# Patient Record
Sex: Female | Born: 1935 | Race: White | Hispanic: No | Smoking: Never smoker
Health system: Southern US, Community
[De-identification: ages and names within clinical notes are randomized; demographics above are authoritative.]

## PROBLEM LIST (undated history)

## (undated) DIAGNOSIS — N179 Acute kidney failure, unspecified: Secondary | ICD-10-CM

## (undated) DIAGNOSIS — W19XXXA Unspecified fall, initial encounter: Secondary | ICD-10-CM

## (undated) DIAGNOSIS — E039 Hypothyroidism, unspecified: Secondary | ICD-10-CM

## (undated) DIAGNOSIS — E119 Type 2 diabetes mellitus without complications: Secondary | ICD-10-CM

## (undated) DIAGNOSIS — I499 Cardiac arrhythmia, unspecified: Secondary | ICD-10-CM

## (undated) DIAGNOSIS — I10 Essential (primary) hypertension: Secondary | ICD-10-CM

## (undated) DIAGNOSIS — C3490 Malignant neoplasm of unspecified part of unspecified bronchus or lung: Secondary | ICD-10-CM

## (undated) DIAGNOSIS — I639 Cerebral infarction, unspecified: Secondary | ICD-10-CM

## (undated) DIAGNOSIS — R55 Syncope and collapse: Secondary | ICD-10-CM

## (undated) DIAGNOSIS — I2510 Atherosclerotic heart disease of native coronary artery without angina pectoris: Secondary | ICD-10-CM

## (undated) DIAGNOSIS — D649 Anemia, unspecified: Secondary | ICD-10-CM

## (undated) DIAGNOSIS — R0602 Shortness of breath: Secondary | ICD-10-CM

## (undated) DIAGNOSIS — K5792 Diverticulitis of intestine, part unspecified, without perforation or abscess without bleeding: Secondary | ICD-10-CM

## (undated) HISTORY — PX: BREAST EXCISIONAL BIOPSY: SUR124

## (undated) HISTORY — PX: ABDOMINAL HYSTERECTOMY: SHX81

## (undated) HISTORY — PX: CARDIAC CATHETERIZATION: SHX172

## (undated) HISTORY — PX: CHOLECYSTECTOMY: SHX55

## (undated) HISTORY — PX: HEMORRHOID SURGERY: SHX153

## (undated) HISTORY — PX: BREAST SURGERY: SHX581

## (undated) HISTORY — PX: OTHER SURGICAL HISTORY: SHX169

---

## 2002-08-17 ENCOUNTER — Encounter: Admission: RE | Admit: 2002-08-17 | Discharge: 2002-08-17 | Payer: Self-pay | Admitting: Endocrinology

## 2002-08-17 ENCOUNTER — Encounter: Payer: Self-pay | Admitting: Endocrinology

## 2002-09-22 ENCOUNTER — Ambulatory Visit (HOSPITAL_COMMUNITY): Admission: RE | Admit: 2002-09-22 | Discharge: 2002-09-22 | Payer: Self-pay | Admitting: *Deleted

## 2002-09-22 ENCOUNTER — Encounter (INDEPENDENT_AMBULATORY_CARE_PROVIDER_SITE_OTHER): Payer: Self-pay | Admitting: Specialist

## 2003-02-23 ENCOUNTER — Encounter: Admission: RE | Admit: 2003-02-23 | Discharge: 2003-02-23 | Payer: Self-pay | Admitting: Endocrinology

## 2003-02-23 ENCOUNTER — Encounter: Payer: Self-pay | Admitting: Endocrinology

## 2003-03-06 ENCOUNTER — Encounter: Admission: RE | Admit: 2003-03-06 | Discharge: 2003-03-06 | Payer: Self-pay | Admitting: Endocrinology

## 2003-03-06 ENCOUNTER — Encounter: Payer: Self-pay | Admitting: Endocrinology

## 2003-04-14 ENCOUNTER — Encounter: Payer: Self-pay | Admitting: Neurosurgery

## 2003-04-14 ENCOUNTER — Inpatient Hospital Stay (HOSPITAL_COMMUNITY): Admission: EM | Admit: 2003-04-14 | Discharge: 2003-04-19 | Payer: Self-pay | Admitting: *Deleted

## 2003-04-14 ENCOUNTER — Encounter: Admission: RE | Admit: 2003-04-14 | Discharge: 2003-04-14 | Payer: Self-pay | Admitting: Neurosurgery

## 2003-04-14 ENCOUNTER — Encounter: Payer: Self-pay | Admitting: Radiology

## 2003-09-06 ENCOUNTER — Encounter: Payer: Self-pay | Admitting: Endocrinology

## 2003-09-06 ENCOUNTER — Ambulatory Visit (HOSPITAL_COMMUNITY): Admission: RE | Admit: 2003-09-06 | Discharge: 2003-09-06 | Payer: Self-pay | Admitting: Endocrinology

## 2003-09-22 ENCOUNTER — Encounter: Admission: RE | Admit: 2003-09-22 | Discharge: 2003-09-22 | Payer: Self-pay | Admitting: Endocrinology

## 2003-09-22 ENCOUNTER — Encounter: Payer: Self-pay | Admitting: Endocrinology

## 2003-09-25 ENCOUNTER — Encounter: Payer: Self-pay | Admitting: Endocrinology

## 2003-09-25 ENCOUNTER — Ambulatory Visit (HOSPITAL_COMMUNITY): Admission: RE | Admit: 2003-09-25 | Discharge: 2003-09-25 | Payer: Self-pay | Admitting: Endocrinology

## 2003-09-25 ENCOUNTER — Encounter (INDEPENDENT_AMBULATORY_CARE_PROVIDER_SITE_OTHER): Payer: Self-pay | Admitting: Specialist

## 2003-11-13 ENCOUNTER — Ambulatory Visit (HOSPITAL_COMMUNITY): Admission: RE | Admit: 2003-11-13 | Discharge: 2003-11-13 | Payer: Self-pay | Admitting: Oncology

## 2003-11-30 ENCOUNTER — Ambulatory Visit (HOSPITAL_COMMUNITY): Admission: RE | Admit: 2003-11-30 | Discharge: 2003-11-30 | Payer: Self-pay | Admitting: Oncology

## 2003-12-05 ENCOUNTER — Ambulatory Visit (HOSPITAL_COMMUNITY): Admission: RE | Admit: 2003-12-05 | Discharge: 2003-12-05 | Payer: Self-pay | Admitting: Oncology

## 2004-09-23 ENCOUNTER — Encounter: Admission: RE | Admit: 2004-09-23 | Discharge: 2004-09-23 | Payer: Self-pay | Admitting: Endocrinology

## 2004-10-25 ENCOUNTER — Ambulatory Visit (HOSPITAL_COMMUNITY): Admission: RE | Admit: 2004-10-25 | Discharge: 2004-10-25 | Payer: Self-pay | Admitting: Endocrinology

## 2005-05-27 ENCOUNTER — Ambulatory Visit (HOSPITAL_COMMUNITY): Admission: RE | Admit: 2005-05-27 | Discharge: 2005-05-27 | Payer: Self-pay | Admitting: Family Medicine

## 2005-09-19 ENCOUNTER — Emergency Department (HOSPITAL_COMMUNITY): Admission: EM | Admit: 2005-09-19 | Discharge: 2005-09-19 | Payer: Self-pay | Admitting: Emergency Medicine

## 2005-09-24 ENCOUNTER — Encounter: Admission: RE | Admit: 2005-09-24 | Discharge: 2005-09-24 | Payer: Self-pay | Admitting: Endocrinology

## 2006-01-08 ENCOUNTER — Ambulatory Visit (HOSPITAL_COMMUNITY): Admission: RE | Admit: 2006-01-08 | Discharge: 2006-01-08 | Payer: Self-pay | Admitting: Family Medicine

## 2006-07-07 ENCOUNTER — Emergency Department (HOSPITAL_COMMUNITY): Admission: EM | Admit: 2006-07-07 | Discharge: 2006-07-07 | Payer: Self-pay | Admitting: Emergency Medicine

## 2006-09-25 ENCOUNTER — Encounter: Admission: RE | Admit: 2006-09-25 | Discharge: 2006-09-25 | Payer: Self-pay | Admitting: Endocrinology

## 2007-09-27 ENCOUNTER — Encounter: Admission: RE | Admit: 2007-09-27 | Discharge: 2007-09-27 | Payer: Self-pay | Admitting: Endocrinology

## 2008-09-27 ENCOUNTER — Encounter: Admission: RE | Admit: 2008-09-27 | Discharge: 2008-09-27 | Payer: Self-pay | Admitting: Endocrinology

## 2009-09-28 ENCOUNTER — Encounter: Admission: RE | Admit: 2009-09-28 | Discharge: 2009-09-28 | Payer: Self-pay | Admitting: Endocrinology

## 2009-10-17 ENCOUNTER — Encounter: Admission: RE | Admit: 2009-10-17 | Discharge: 2009-10-17 | Payer: Self-pay | Admitting: Endocrinology

## 2009-12-01 ENCOUNTER — Inpatient Hospital Stay (HOSPITAL_COMMUNITY): Admission: EM | Admit: 2009-12-01 | Discharge: 2009-12-04 | Payer: Self-pay | Admitting: Emergency Medicine

## 2009-12-03 ENCOUNTER — Encounter (INDEPENDENT_AMBULATORY_CARE_PROVIDER_SITE_OTHER): Payer: Self-pay | Admitting: Family Medicine

## 2010-03-22 ENCOUNTER — Encounter: Admission: RE | Admit: 2010-03-22 | Discharge: 2010-03-22 | Payer: Self-pay | Admitting: Endocrinology

## 2010-05-15 ENCOUNTER — Encounter: Admission: RE | Admit: 2010-05-15 | Discharge: 2010-05-15 | Payer: Self-pay | Admitting: Endocrinology

## 2010-09-30 ENCOUNTER — Encounter: Admission: RE | Admit: 2010-09-30 | Discharge: 2010-09-30 | Payer: Self-pay | Admitting: Endocrinology

## 2011-01-18 ENCOUNTER — Encounter (HOSPITAL_COMMUNITY): Payer: Self-pay | Admitting: Oncology

## 2011-02-14 ENCOUNTER — Emergency Department (HOSPITAL_COMMUNITY): Payer: Medicare Other

## 2011-02-14 ENCOUNTER — Emergency Department (HOSPITAL_COMMUNITY)
Admission: EM | Admit: 2011-02-14 | Discharge: 2011-02-14 | Disposition: A | Discharge status: Home / Self Care | Payer: Medicare Other | Attending: Emergency Medicine | Admitting: Emergency Medicine

## 2011-02-14 DIAGNOSIS — M79609 Pain in unspecified limb: Secondary | ICD-10-CM | POA: Insufficient documentation

## 2011-02-14 DIAGNOSIS — M810 Age-related osteoporosis without current pathological fracture: Secondary | ICD-10-CM | POA: Insufficient documentation

## 2011-02-14 DIAGNOSIS — R51 Headache: Secondary | ICD-10-CM | POA: Insufficient documentation

## 2011-02-14 DIAGNOSIS — M545 Low back pain, unspecified: Secondary | ICD-10-CM | POA: Insufficient documentation

## 2011-02-14 DIAGNOSIS — R609 Edema, unspecified: Secondary | ICD-10-CM | POA: Insufficient documentation

## 2011-02-14 DIAGNOSIS — Z9889 Other specified postprocedural states: Secondary | ICD-10-CM | POA: Insufficient documentation

## 2011-02-14 DIAGNOSIS — H538 Other visual disturbances: Secondary | ICD-10-CM | POA: Insufficient documentation

## 2011-02-14 DIAGNOSIS — Y92009 Unspecified place in unspecified non-institutional (private) residence as the place of occurrence of the external cause: Secondary | ICD-10-CM | POA: Insufficient documentation

## 2011-02-14 DIAGNOSIS — W010XXA Fall on same level from slipping, tripping and stumbling without subsequent striking against object, initial encounter: Secondary | ICD-10-CM | POA: Insufficient documentation

## 2011-02-14 DIAGNOSIS — R109 Unspecified abdominal pain: Secondary | ICD-10-CM | POA: Insufficient documentation

## 2011-02-14 DIAGNOSIS — E119 Type 2 diabetes mellitus without complications: Secondary | ICD-10-CM | POA: Insufficient documentation

## 2011-02-14 DIAGNOSIS — S62329A Displaced fracture of shaft of unspecified metacarpal bone, initial encounter for closed fracture: Secondary | ICD-10-CM | POA: Insufficient documentation

## 2011-02-14 DIAGNOSIS — M546 Pain in thoracic spine: Secondary | ICD-10-CM | POA: Insufficient documentation

## 2011-02-14 DIAGNOSIS — E039 Hypothyroidism, unspecified: Secondary | ICD-10-CM | POA: Insufficient documentation

## 2011-02-14 DIAGNOSIS — Z85118 Personal history of other malignant neoplasm of bronchus and lung: Secondary | ICD-10-CM | POA: Insufficient documentation

## 2011-02-14 LAB — URINALYSIS, ROUTINE W REFLEX MICROSCOPIC
Hgb urine dipstick: NEGATIVE
Nitrite: NEGATIVE
Protein, ur: 30 mg/dL — AB
Specific Gravity, Urine: 1.023 (ref 1.005–1.030)
Urobilinogen, UA: 0.2 mg/dL (ref 0.0–1.0)

## 2011-02-14 LAB — CBC
HCT: 37.6 % (ref 36.0–46.0)
Platelets: 168 10*3/uL (ref 150–400)
RDW: 12.2 % (ref 11.5–15.5)
WBC: 10.6 10*3/uL — ABNORMAL HIGH (ref 4.0–10.5)

## 2011-02-14 LAB — URINE MICROSCOPIC-ADD ON

## 2011-02-14 LAB — BASIC METABOLIC PANEL
BUN: 8 mg/dL (ref 6–23)
CO2: 26 mEq/L (ref 19–32)
Chloride: 99 mEq/L (ref 96–112)
Creatinine, Ser: 0.89 mg/dL (ref 0.4–1.2)
Glucose, Bld: 236 mg/dL — ABNORMAL HIGH (ref 70–99)
Potassium: 3.5 mEq/L (ref 3.5–5.1)

## 2011-02-14 LAB — DIFFERENTIAL
Basophils Absolute: 0 10*3/uL (ref 0.0–0.1)
Basophils Relative: 0 % (ref 0–1)
Eosinophils Relative: 1 % (ref 0–5)
Lymphocytes Relative: 30 % (ref 12–46)

## 2011-04-01 LAB — CBC
HCT: 31.5 % — ABNORMAL LOW (ref 36.0–46.0)
Hemoglobin: 10.6 g/dL — ABNORMAL LOW (ref 12.0–15.0)
Hemoglobin: 13.7 g/dL (ref 12.0–15.0)
MCHC: 32.9 g/dL (ref 30.0–36.0)
MCHC: 33.7 g/dL (ref 30.0–36.0)
MCHC: 33.7 g/dL (ref 30.0–36.0)
MCV: 89.6 fL (ref 78.0–100.0)
MCV: 91.7 fL (ref 78.0–100.0)
Platelets: 84 10*3/uL — ABNORMAL LOW (ref 150–400)
Platelets: 98 10*3/uL — ABNORMAL LOW (ref 150–400)
Platelets: 99 10*3/uL — ABNORMAL LOW (ref 150–400)
RBC: 3.33 MIL/uL — ABNORMAL LOW (ref 3.87–5.11)
RBC: 3.52 MIL/uL — ABNORMAL LOW (ref 3.87–5.11)
RBC: 4.55 MIL/uL (ref 3.87–5.11)
RDW: 13.1 % (ref 11.5–15.5)
WBC: 11.3 10*3/uL — ABNORMAL HIGH (ref 4.0–10.5)
WBC: 4.3 10*3/uL (ref 4.0–10.5)
WBC: 4.9 10*3/uL (ref 4.0–10.5)

## 2011-04-01 LAB — DIFFERENTIAL
Basophils Relative: 1 % (ref 0–1)
Eosinophils Absolute: 0 10*3/uL (ref 0.0–0.7)
Lymphs Abs: 0.6 10*3/uL — ABNORMAL LOW (ref 0.7–4.0)
Monocytes Absolute: 0.7 10*3/uL (ref 0.1–1.0)
Monocytes Relative: 6 % (ref 3–12)
Neutrophils Relative %: 88 % — ABNORMAL HIGH (ref 43–77)

## 2011-04-01 LAB — BASIC METABOLIC PANEL
BUN: 8 mg/dL (ref 6–23)
BUN: 8 mg/dL (ref 6–23)
CO2: 25 mEq/L (ref 19–32)
Calcium: 7.3 mg/dL — ABNORMAL LOW (ref 8.4–10.5)
Calcium: 7.9 mg/dL — ABNORMAL LOW (ref 8.4–10.5)
Creatinine, Ser: 0.82 mg/dL (ref 0.4–1.2)
GFR calc Af Amer: >60 mL/min (ref >60)
GFR calc non Af Amer: >60 mL/min (ref >60)
Glucose, Bld: 153 mg/dL — ABNORMAL HIGH (ref 70–99)

## 2011-04-01 LAB — URINALYSIS, ROUTINE W REFLEX MICROSCOPIC
Glucose, UA: >1000 mg/dL — AB
pH: 5.5 (ref 5.0–8.0)

## 2011-04-01 LAB — PROTIME-INR: INR: 1.04 (ref 0.00–1.49)

## 2011-04-01 LAB — CARDIAC PANEL(CRET KIN+CKTOT+MB+TROPI)
CK, MB: 1 ng/mL (ref 0.3–4.0)
Relative Index: INVALID (ref 0.0–2.5)
Total CK: 69 U/L (ref 7–177)

## 2011-04-01 LAB — GLUCOSE, CAPILLARY
Glucose-Capillary: 126 mg/dL — ABNORMAL HIGH (ref 70–99)
Glucose-Capillary: 137 mg/dL — ABNORMAL HIGH (ref 70–99)
Glucose-Capillary: 161 mg/dL — ABNORMAL HIGH (ref 70–99)
Glucose-Capillary: 210 mg/dL — ABNORMAL HIGH (ref 70–99)
Glucose-Capillary: 245 mg/dL — ABNORMAL HIGH (ref 70–99)
Glucose-Capillary: 265 mg/dL — ABNORMAL HIGH (ref 70–99)
Glucose-Capillary: 348 mg/dL — ABNORMAL HIGH (ref 70–99)
Glucose-Capillary: 372 mg/dL — ABNORMAL HIGH (ref 70–99)

## 2011-04-01 LAB — URINE MICROSCOPIC-ADD ON

## 2011-04-01 LAB — HEMOGLOBIN A1C
Hgb A1c MFr Bld: 10.4 % — ABNORMAL HIGH (ref 4.6–6.1)
Mean Plasma Glucose: 252 mg/dL

## 2011-04-01 LAB — POCT I-STAT, CHEM 8
BUN: 12 mg/dL (ref 6–23)
Chloride: 98 mEq/L (ref 96–112)
Creatinine, Ser: 0.6 mg/dL (ref 0.4–1.2)
Sodium: 138 mEq/L (ref 135–145)
TCO2: 24 mmol/L (ref 0–100)

## 2011-04-01 LAB — TSH: TSH: 0.716 u[IU]/mL (ref 0.350–4.500)

## 2011-04-01 LAB — TROPONIN I: Troponin I: 0.01 ng/mL (ref 0.00–0.06)

## 2011-05-16 NOTE — Op Note (Signed)
   NAME:  [name], [name]                        ACCOUNT NO.:  [id number]   MEDICAL RECORD NO.:  [id number]                   PATIENT TYPE:  AMB   LOCATION:  ENDO                                 FACILITY:  [facility]   PHYSICIAN:  Georgiana Spinner, M.D.                 DATE OF BIRTH:  [birth year]   DATE OF PROCEDURE:  09/22/2002  DATE OF DISCHARGE:                                 OPERATIVE REPORT   PROCEDURE PERFORMED:  Upper endoscopy.   ENDOSCOPIST:  Georgiana Spinner, M.D.   INDICATIONS FOR PROCEDURE:  Gastroesophageal reflux disease.   ANESTHESIA:  Demerol  70 mg, Versed 7 mg.   DESCRIPTION OF PROCEDURE:  With the patient mildly sedated in the left  lateral decubitus position, the Olympus video endoscope was inserted in the  mouth and passed under direct vision through the esophagus which appeared  normal until we reached the distal esophagus and there were changes of  esophagitis with one area of linear erythema extending cephalad to the  esophagus.  At this area there was a polyp which may have been an  inflammatory polyp that was seen and photographed in the same view.  It ws  then biopsied.  We entered into the stomach.  The fundus, body, antrum,  duodenal bulb and second portion of the duodenum all appeared normal.  From  this point, the endoscope was slowly withdrawn taking circumferential views  of the entire duodenal mucosa until the endoscope was pulled back into the  stomach and placed on retroflexion to view the stomach from below.  The  endoscope was then straightened and withdrawn taking circumferential views  of the remaining gastric and esophageal mucosa.  The patient's vital signs  and pulse oximeter remained stable.  The patient tolerated the procedure  well without apparent complications.   FINDINGS:  Esophagitis with esophageal polyp.  Await biopsy report.  The  patient will call me for results and follow up with me as an outpatient.   PLAN:  Await biopsy report.  The  patient will call me for results and follow  up with me as an outpatient.  Proceed to colonoscopy as planned.                                                Georgiana Spinner, M.D.    GMO/MEDQ  D:  09/22/2002  T:  09/22/2002  Job:  [id number]

## 2011-05-16 NOTE — H&P (Signed)
NAME:  [name], [name]                        ACCOUNT NO.:  [id number]   MEDICAL RECORD NO.:  [id number]                   PATIENT TYPE:  INP   LOCATION:  5506                                 FACILITY:  [facility]   PHYSICIAN:  Soyla Murphy. Renne Crigler, M.D.               DATE OF BIRTH:  28-Feb-1934   DATE OF ADMISSION:  04/14/2003  DATE OF DISCHARGE:                                HISTORY & PHYSICAL   LABORATORY:  Sodium 136, potassium 4.5, glucose 604, BUN 11, creatinine 1.0.  Remainder of C-MET, C-MAT is within normal limits.  CBC is normal  except  for slight increase in percent of neutrophils 86%.  White count is 9.0.   Urinalysis glucose greater than 1,000.  Otherwise negative.   PROCEDURE:  Needle placed at epidural space earlier this day, injection 5 cc  containing 120 mg Depo-Provera-Medrol with 3 cc of 1% lidocaine.  Left L4-L5  interlaminar epidurogram, normal  in appearance.   HISTORY:  [name] is a 77 year old married white resident of [facility] [place] who comes  in with a chief complaint of vomiting.  She felt well this morning.  She had  the epidural steroid injection as noted above earlier today.  Subsequently  she was nauseated.  She had dry heaves.  She had a CBG of 400.  She did not  really vomit.  It was more dry heaves and came into the emergency room.  Here her glucose was greater than 600.  She had known diabetes mellitus and  was treating herself with an herbal supplement daily.  One week ago Dr.  Juleen China switched her from that to Avandia.  However, she had not yet picked up  the Avandia at the drug store.  She was going to do so tomorrow and switch  over to the Avandia from the herbal supplement.  She denies any  hematochezia, melena, hematemesis or abdominal pain.  No clear exacerbating  or any factors other then as noted above.  She denies any fevers.   PAST MEDICAL HISTORY:  Notable for multiple surgeries which are outlined in  detail below.   ALLERGIES:  She is intolerant of  CODEINE.  No known allergies.   FAMILY HISTORY:  Mother died of old age but she had breast cancer.  Brother  had breast cancer.  Father died of cerebral hemorrhage.  Sister died of  diabetes.  She has three sons alive and well.   SOCIAL HISTORY:  She worked at [employer] in [employer] before moving to  [place], where she worked at [employer].  Now she is retired.  She does not  ___________.  She does not use ethanol or tobacco.   REVIEW OF SYSTEMS:  Some numbness in toes.  She denies any  headaches,  weakness, seizures, syncope, change in hearing or change in vision.  There  is no sinus pain, ear pain, sore throat, runny nose.  Occasionally she feels  a little bit of a fullness and swelling in the lower throat area.  There is  no palpitations, chest pain or shortness of breath or cough.  She denies any  skin changes, lumps or bumps or mood change, no temperature change.  She  does have dyspareunia and sometimes bleeding after intercourse but she has  no other vaginal bleeding.  She denies any dysuria, hematuria, or frequency  or urgency.  There is no muscle or joint aches other then the recent left  leg pains for which she is on the narcotic medication.   CURRENT MEDICATIONS:  1. Altace 10 mg p.o. daily.  2. Os-Cal with D 500 mg b.i.d.  3. Pravigard 81/42 p.o. q.p.m.  4. Levoxyl 0.125 mg p.o. daily.  5. Avandia 4 mg p.o. q.a.m.  6. Hydrocodone/APAP 5/500 one four times a day p.r.n.   PHYSICAL EXAMINATION:  GENERAL:  She is well-appearing, in no acute  distress.  VITAL SIGNS:  BP 194/79, pulse 112, respirations 20, temperature 97.9.  SKIN:  Shows scattered nevi.  There is no cervical, supraclavicular,  axillary or inguinal adenopathy.  HEENT:  Head is normocephalic, atraumatic.  Tympanic membranes, posterior  pharynx and tongue appeared normal.  Lids and conjunctivae are normal.  PERRL; EOMI.  NECK:  Supple.  Mild goiter.  No neck masses.  She has prominent hump in  the lower neck  area.  LUNGS:  Clear to auscultation.  HEART:  Regular, rate and rhythm.  Normal  S1, S2.  No murmurs, rubs or  gallops.  Equal 2+ carotid, radial, and dorsalis pedis pulses with no  carotid bruits.  BREASTS:  Without masses or tenderness.  ABDOMEN:  Nontender.  Bowel sounds present.  No organomegaly.  No masses.  GENITOURINARY:  Atrophic external genitalia.  Bimanual exam showing status  post hysterectomy.  No masses, no tenderness, no blood.  RECTAL:  Stool is brown and heme negative.  No rectal masses or tenderness.  Normal  rectal tone.  EXTREMITIES:  She has 2+ equal deep tendon reflexes in knees.  NEUROLOGIC:  Normal  speech.  Gait is not tested.  She has equal strength  and EHL hip flexors, knee flexors and knee extensors bilaterally.  Cranial  nerves II-XII are intact.   IMPRESSION:  1. Diabetes mellitus out of control related to the epidural injection.     Placed on Glucometer.  We will try and get her off of that tonight,     tomorrow morning and on to the Avandia.  Please get Avandia filled.     Possibly home on Sunday if stable.  2. Hypertension.  Monitor and current medications.  3. Dyspareunia, vaginal bleeding.  Will need evaluation with speculum after     hospital stay.  4. Probable diabetic neuropathy next to her goiter.  5. Occasional dysphagia, question related to goiter.  6. Multiple surgeries.     a. Benign breast surgery.     b. Tumors removed from right side of head.     c. Right ear surgery.     d. Hysterectomy with bilateral salpingo-oophorectomy.     e. Cholecystectomy.     f. Wrist surgery.  7.     Intolerant - CODEINE.  8. Family history of breast cancer and diabetes mellitus.  9. Nausea and vomiting probably secondary to number one.  There is no     evidence of diabetic ketoacidosis.  Soyla Murphy. Renne Crigler, M.D.    WDP/MEDQ  D:  04/14/2003  T:  04/15/2003  Job:  [id number]   cc:   Brooke Bonito, M.D.   [address]  [place]  [place] [address]  Fax: [phone]

## 2011-05-16 NOTE — Op Note (Signed)
   NAME:  [name], [name]                        ACCOUNT NO.:  [id number]   MEDICAL RECORD NO.:  [id number]                   PATIENT TYPE:  AMB   LOCATION:  ENDO                                 FACILITY:  [facility]   PHYSICIAN:  Georgiana Spinner, M.D.                 DATE OF BIRTH:  01/13/1934   DATE OF PROCEDURE:  09/22/2002  DATE OF DISCHARGE:                                 OPERATIVE REPORT   PROCEDURE PERFORMED:  Colonoscopy.   ENDOSCOPIST:  Georgiana Spinner, M.D.   INDICATIONS FOR PROCEDURE:  Rectal bleeding.   ANESTHESIA:  Versed 1 mg.   DESCRIPTION OF PROCEDURE:  With the patient mildly sedated in the left  lateral decubitus the Olympus video colonoscope was inserted in the rectum  and passed under direct vision to the cecum, identified by the ileocecal  valve and appendiceal orifice.  In the cecum there were areas of linear  mucosal defects that had bled somewhat which were consistent with traction  on the cecum presumably from adhesions and from this point the colonoscope  was slowly withdrawn taking circumferential views of the entire colonic  mucosa stopping only in the rectum which appeared normal on direct and  showed hemorrhoids on retroflex view.  The endoscope was straightened and  withdrawn.  The patient's vital signs and pulse oximeter remained stable.  The patient tolerated the procedure well without apparent complications.   FINDINGS:  Internal hemorrhoids.  Otherwise unremarkable colonoscopic  examination.   PLAN:  Will have the patient follow up with me as an outpatient.                                                Georgiana Spinner, M.D.    GMO/MEDQ  D:  09/22/2002  T:  09/22/2002  Job:  [id number]

## 2011-05-16 NOTE — Discharge Summary (Signed)
   NAME:  [name], [name]                        ACCOUNT NO.:  [id number]   MEDICAL RECORD NO.:  [id number]                   PATIENT TYPE:  INP   LOCATION:  5501                                 FACILITY:  [facility]   PHYSICIAN:  Brooke Bonito, M.D.                   DATE OF BIRTH:  17-Jul-1934   DATE OF ADMISSION:  04/14/2003  DATE OF DISCHARGE:  04/19/2003                                 DISCHARGE SUMMARY   DISCHARGE DIAGNOSES:  1. Diabetes mellitus out of control secondary to epidural injection.  2. Hypertension.  3. Dyspareunia and vaginal bleeding.  4. Thyroid goiter.  5. Multiple past surgeries, including breast surgery, right ear surgery,     hysterectomy, cholecystectomy, and wrist surgery.   HOSPITAL COURSE:  The patient is a 77 year old, female patient of Dr. Juleen China,  admitted in his absence by Soyla Murphy. Renne Crigler, M.D., after she presented with a  glucose of 604 after she received an epidural injection for chronic pain  syndrome.  She was admitted and was treated with insulin and her sugar  greatly improved.  It was noteworthy that the patient was not taking her  medications as I advised her on an outpatient basis and I felt it would be  best just to place her on insulin therapy and this was done prior to her  dismissal.  At the time of dismissal, she was improved.  She was seen by the  diabetic educator.  She was discharged to be followed by myself in the  office.   DISCHARGE MEDICATIONS:  1. Lantus insulin 30 units subcu every evening.  2. Altace 10 mg daily.  3. Pravagard 80 mg daily.  4. Levoxyl 0.125 mg daily.  5. Avandia 4 mg daily.   LABORATORY DATA:  The EKG had a normal sinus rhythm.   CBC on April 14, 2003:  The white count was 9000 and the hemoglobin was  13.6.  Electrolytes:  The sodium was 136, potassium 4.5, chloride 101, CO2  22, glucose 604, BUN 11, and creatinine 1.0.  On April 19, 2003, the sodium  was 140, potassium 3.8, glucose 268, creatinine 0.8, and BUN  19.  Cardiac  enzymes were unremarkable.  The urinalysis on April 14, 2003, had 15 mg/dl.   FOLLOWUP:  Follow up with Dr. Juleen China in a week's time.                                               Brooke Bonito, M.D.    WDK/MEDQ  D:  05/25/2003  T:  05/26/2003  Job:  [id number]

## 2011-09-02 ENCOUNTER — Other Ambulatory Visit: Payer: Self-pay | Admitting: Endocrinology

## 2011-09-02 DIAGNOSIS — Z1231 Encounter for screening mammogram for malignant neoplasm of breast: Secondary | ICD-10-CM

## 2011-10-03 ENCOUNTER — Ambulatory Visit
Admission: RE | Admit: 2011-10-03 | Discharge: 2011-10-03 | Disposition: A | Discharge status: Home / Self Care | Payer: Medicare Other | Source: Ambulatory Visit | Attending: Endocrinology | Admitting: Endocrinology

## 2011-10-03 DIAGNOSIS — Z1231 Encounter for screening mammogram for malignant neoplasm of breast: Secondary | ICD-10-CM

## 2012-03-13 ENCOUNTER — Other Ambulatory Visit (HOSPITAL_COMMUNITY): Payer: Self-pay | Admitting: Pharmacy Technician

## 2012-03-13 ENCOUNTER — Emergency Department (HOSPITAL_COMMUNITY): Payer: Medicare Other

## 2012-03-13 ENCOUNTER — Encounter (HOSPITAL_COMMUNITY): Payer: Self-pay | Admitting: Adult Health

## 2012-03-13 ENCOUNTER — Inpatient Hospital Stay (HOSPITAL_COMMUNITY)
Admission: EM | Admit: 2012-03-13 | Discharge: 2012-03-17 | DRG: 308 | Disposition: A | Discharge status: Home / Self Care | Payer: Medicare Other | Attending: Internal Medicine | Admitting: Internal Medicine

## 2012-03-13 DIAGNOSIS — IMO0002 Reserved for concepts with insufficient information to code with codable children: Secondary | ICD-10-CM | POA: Diagnosis present

## 2012-03-13 DIAGNOSIS — E119 Type 2 diabetes mellitus without complications: Secondary | ICD-10-CM

## 2012-03-13 DIAGNOSIS — I509 Heart failure, unspecified: Secondary | ICD-10-CM | POA: Diagnosis present

## 2012-03-13 DIAGNOSIS — E1129 Type 2 diabetes mellitus with other diabetic kidney complication: Secondary | ICD-10-CM | POA: Diagnosis present

## 2012-03-13 DIAGNOSIS — I4891 Unspecified atrial fibrillation: Principal | ICD-10-CM | POA: Diagnosis present

## 2012-03-13 DIAGNOSIS — I639 Cerebral infarction, unspecified: Secondary | ICD-10-CM | POA: Diagnosis present

## 2012-03-13 DIAGNOSIS — D3A090 Benign carcinoid tumor of the bronchus and lung: Secondary | ICD-10-CM | POA: Diagnosis present

## 2012-03-13 DIAGNOSIS — E1165 Type 2 diabetes mellitus with hyperglycemia: Secondary | ICD-10-CM | POA: Diagnosis present

## 2012-03-13 DIAGNOSIS — R0602 Shortness of breath: Secondary | ICD-10-CM | POA: Diagnosis present

## 2012-03-13 DIAGNOSIS — I5033 Acute on chronic diastolic (congestive) heart failure: Secondary | ICD-10-CM | POA: Diagnosis present

## 2012-03-13 DIAGNOSIS — E039 Hypothyroidism, unspecified: Secondary | ICD-10-CM | POA: Diagnosis present

## 2012-03-13 DIAGNOSIS — R0789 Other chest pain: Secondary | ICD-10-CM | POA: Diagnosis present

## 2012-03-13 DIAGNOSIS — Z8673 Personal history of transient ischemic attack (TIA), and cerebral infarction without residual deficits: Secondary | ICD-10-CM

## 2012-03-13 DIAGNOSIS — R110 Nausea: Secondary | ICD-10-CM | POA: Diagnosis present

## 2012-03-13 DIAGNOSIS — I2510 Atherosclerotic heart disease of native coronary artery without angina pectoris: Secondary | ICD-10-CM | POA: Diagnosis present

## 2012-03-13 DIAGNOSIS — Z794 Long term (current) use of insulin: Secondary | ICD-10-CM | POA: Diagnosis present

## 2012-03-13 DIAGNOSIS — I10 Essential (primary) hypertension: Secondary | ICD-10-CM | POA: Diagnosis present

## 2012-03-13 DIAGNOSIS — R0600 Dyspnea, unspecified: Secondary | ICD-10-CM

## 2012-03-13 DIAGNOSIS — C3490 Malignant neoplasm of unspecified part of unspecified bronchus or lung: Secondary | ICD-10-CM | POA: Diagnosis present

## 2012-03-13 HISTORY — DX: Atherosclerotic heart disease of native coronary artery without angina pectoris: I25.10

## 2012-03-13 HISTORY — DX: Malignant neoplasm of unspecified part of unspecified bronchus or lung: C34.90

## 2012-03-13 HISTORY — DX: Hypothyroidism, unspecified: E03.9

## 2012-03-13 HISTORY — DX: Type 2 diabetes mellitus without complications: E11.9

## 2012-03-13 HISTORY — DX: Essential (primary) hypertension: I10

## 2012-03-13 HISTORY — DX: Cerebral infarction, unspecified: I63.9

## 2012-03-13 LAB — BASIC METABOLIC PANEL
BUN: 14 mg/dL (ref 6–23)
CO2: 25 mEq/L (ref 19–32)
Calcium: 9.2 mg/dL (ref 8.4–10.5)
Chloride: 101 mEq/L (ref 96–112)
Creatinine, Ser: 1 mg/dL (ref 0.50–1.10)
GFR calc Af Amer: 62 mL/min — ABNORMAL LOW (ref >90)
GFR calc non Af Amer: 54 mL/min — ABNORMAL LOW (ref >90)
Glucose, Bld: 276 mg/dL — ABNORMAL HIGH (ref 70–99)
Potassium: 4.1 mEq/L (ref 3.5–5.1)
Sodium: 138 mEq/L (ref 135–145)

## 2012-03-13 LAB — CBC
HCT: 34.5 % — ABNORMAL LOW (ref 36.0–46.0)
Hemoglobin: 12.4 g/dL (ref 12.0–15.0)
MCH: 30.7 pg (ref 26.0–34.0)
MCHC: 35.9 g/dL (ref 30.0–36.0)
MCV: 85.4 fL (ref 78.0–100.0)
Platelets: 147 10*3/uL — ABNORMAL LOW (ref 150–400)
RBC: 4.04 MIL/uL (ref 3.87–5.11)
RDW: 12.4 % (ref 11.5–15.5)
WBC: 7 10*3/uL (ref 4.0–10.5)

## 2012-03-13 LAB — PROTIME-INR
INR: 1.07 (ref 0.00–1.49)
Prothrombin Time: 14.1 seconds (ref 11.6–15.2)

## 2012-03-13 LAB — GLUCOSE, CAPILLARY: Glucose-Capillary: 185 mg/dL — ABNORMAL HIGH (ref 70–99)

## 2012-03-13 LAB — TSH: TSH: 1.742 u[IU]/mL (ref 0.350–4.500)

## 2012-03-13 LAB — CARDIAC PANEL(CRET KIN+CKTOT+MB+TROPI): Total CK: 69 U/L (ref 7–177)

## 2012-03-13 LAB — TROPONIN I: Troponin I: <0.3 ng/mL (ref <0.30)

## 2012-03-13 MED ORDER — MECLIZINE HCL 25 MG PO TABS
25.0000 mg | ORAL_TABLET | Freq: Four times a day (QID) | ORAL | Status: DC
  Administered 2012-03-14 – 2012-03-17 (×11): 25 mg via ORAL
  Filled 2012-03-13 (×17): qty 1

## 2012-03-13 MED ORDER — ONDANSETRON HCL 4 MG/2ML IJ SOLN
4.0000 mg | Freq: Four times a day (QID) | INTRAMUSCULAR | Status: DC | PRN
  Filled 2012-03-13: qty 2

## 2012-03-13 MED ORDER — METOPROLOL TARTRATE 25 MG PO TABS
25.0000 mg | ORAL_TABLET | Freq: Three times a day (TID) | ORAL | Status: DC
  Administered 2012-03-13 – 2012-03-16 (×8): 25 mg via ORAL
  Filled 2012-03-13 (×11): qty 1

## 2012-03-13 MED ORDER — ALBUTEROL SULFATE (5 MG/ML) 0.5% IN NEBU: 2.5000 mg | INHALATION_SOLUTION | RESPIRATORY_TRACT | Status: DC | PRN

## 2012-03-13 MED ORDER — NITROGLYCERIN 2 % TD OINT
TOPICAL_OINTMENT | TRANSDERMAL | Status: AC
  Administered 2012-03-13: 1 [in_us] via TOPICAL
  Filled 2012-03-13: qty 30

## 2012-03-13 MED ORDER — NITROGLYCERIN 2 % TD OINT
1.0000 [in_us] | TOPICAL_OINTMENT | Freq: Four times a day (QID) | TRANSDERMAL | Status: DC
  Administered 2012-03-13: 1 [in_us] via TOPICAL

## 2012-03-13 MED ORDER — RIVAROXABAN 10 MG PO TABS
20.0000 mg | ORAL_TABLET | Freq: Every day | ORAL | Status: DC
  Administered 2012-03-13 – 2012-03-14 (×2): 20 mg via ORAL
  Filled 2012-03-13 (×3): qty 2

## 2012-03-13 MED ORDER — ONDANSETRON HCL 4 MG PO TABS
4.0000 mg | ORAL_TABLET | Freq: Four times a day (QID) | ORAL | Status: DC | PRN
  Administered 2012-03-14: 4 mg via ORAL
  Filled 2012-03-13: qty 1

## 2012-03-13 MED ORDER — SODIUM CHLORIDE 0.9 % IV SOLN
INTRAVENOUS | Status: AC
  Administered 2012-03-13: 21:00:00 via INTRAVENOUS

## 2012-03-13 MED ORDER — SODIUM CHLORIDE 0.9 % IJ SOLN
3.0000 mL | Freq: Two times a day (BID) | INTRAMUSCULAR | Status: DC
  Administered 2012-03-13 – 2012-03-17 (×8): 3 mL via INTRAVENOUS

## 2012-03-13 MED ORDER — ISOSORBIDE MONONITRATE ER 60 MG PO TB24
60.0000 mg | ORAL_TABLET | Freq: Every day | ORAL | Status: DC
  Administered 2012-03-14 – 2012-03-17 (×4): 60 mg via ORAL
  Filled 2012-03-13 (×4): qty 1

## 2012-03-13 MED ORDER — ASPIRIN 81 MG PO CHEW
324.0000 mg | CHEWABLE_TABLET | Freq: Once | ORAL | Status: AC
  Administered 2012-03-13: 324 mg via ORAL

## 2012-03-13 MED ORDER — ONDANSETRON HCL 4 MG/2ML IJ SOLN: 4.0000 mg | Freq: Three times a day (TID) | INTRAMUSCULAR | Status: AC | PRN

## 2012-03-13 MED ORDER — GI COCKTAIL ~~LOC~~
30.0000 mL | Freq: Once | ORAL | Status: AC
  Administered 2012-03-13: 30 mL via ORAL

## 2012-03-13 MED ORDER — ASPIRIN 81 MG PO CHEW
CHEWABLE_TABLET | ORAL | Status: AC
  Administered 2012-03-13: 324 mg via ORAL
  Filled 2012-03-13: qty 4

## 2012-03-13 MED ORDER — LEVOTHYROXINE SODIUM 125 MCG PO TABS
125.0000 ug | ORAL_TABLET | Freq: Every day | ORAL | Status: DC
  Administered 2012-03-14 – 2012-03-17 (×4): 125 ug via ORAL
  Filled 2012-03-13 (×4): qty 1

## 2012-03-13 MED ORDER — SODIUM CHLORIDE 0.9 % IV BOLUS (SEPSIS)
500.0000 mL | Freq: Once | INTRAVENOUS | Status: AC
  Administered 2012-03-13: 1000 mL via INTRAVENOUS

## 2012-03-13 MED ORDER — OLMESARTAN 10 MG HALF TABLET
10.0000 mg | ORAL_TABLET | Freq: Every day | ORAL | Status: DC
  Administered 2012-03-14 – 2012-03-16 (×3): 10 mg via ORAL
  Filled 2012-03-13 (×3): qty 1

## 2012-03-13 MED ORDER — DILTIAZEM HCL 100 MG IV SOLR
5.0000 mg/h | Freq: Once | INTRAVENOUS | Status: AC
  Administered 2012-03-13: 5 mg/h via INTRAVENOUS

## 2012-03-13 MED ORDER — CALCIUM CARBONATE-VITAMIN D 500-200 MG-UNIT PO TABS
1.0000 | ORAL_TABLET | Freq: Every day | ORAL | Status: DC
  Administered 2012-03-14 – 2012-03-17 (×4): 1 via ORAL
  Filled 2012-03-13 (×4): qty 1

## 2012-03-13 MED ORDER — IOHEXOL 300 MG/ML  SOLN
100.0000 mL | Freq: Once | INTRAMUSCULAR | Status: AC | PRN
  Administered 2012-03-13: 100 mL via INTRAVENOUS

## 2012-03-13 MED ORDER — ASPIRIN EC 81 MG PO TBEC
81.0000 mg | DELAYED_RELEASE_TABLET | Freq: Every day | ORAL | Status: DC
  Administered 2012-03-14 – 2012-03-17 (×4): 81 mg via ORAL
  Filled 2012-03-13 (×4): qty 1

## 2012-03-13 MED ORDER — DILTIAZEM HCL 25 MG/5ML IV SOLN
15.0000 mg | Freq: Once | INTRAVENOUS | Status: AC
  Administered 2012-03-13: 15 mg via INTRAVENOUS

## 2012-03-13 MED ORDER — GUAIFENESIN-DM 100-10 MG/5ML PO SYRP: 5.0000 mL | ORAL_SOLUTION | ORAL | Status: DC | PRN

## 2012-03-13 MED ORDER — SODIUM CHLORIDE 0.9 % IV SOLN: INTRAVENOUS | Status: DC

## 2012-03-13 MED ORDER — DILTIAZEM LOAD VIA INFUSION
10.0000 mg | Freq: Once | INTRAVENOUS | Status: AC | PRN
  Filled 2012-03-13: qty 10

## 2012-03-13 MED ORDER — GI COCKTAIL ~~LOC~~
ORAL | Status: AC
  Filled 2012-03-13: qty 30

## 2012-03-13 MED ORDER — HYDROCODONE-ACETAMINOPHEN 5-325 MG PO TABS
1.0000 | ORAL_TABLET | ORAL | Status: DC | PRN
  Administered 2012-03-14: 1 via ORAL
  Filled 2012-03-13: qty 1

## 2012-03-13 MED ORDER — INSULIN ASPART 100 UNIT/ML ~~LOC~~ SOLN
0.0000 [IU] | Freq: Three times a day (TID) | SUBCUTANEOUS | Status: DC
  Administered 2012-03-14: 3 [IU] via SUBCUTANEOUS

## 2012-03-13 MED ORDER — DEXTROSE 5 % IV SOLN
5.0000 mg/h | INTRAVENOUS | Status: DC
  Administered 2012-03-13 – 2012-03-14 (×2): 15 mg/h via INTRAVENOUS
  Filled 2012-03-13 (×4): qty 100

## 2012-03-13 NOTE — ED Provider Notes (Signed)
History    77yF with dyspnea. Onset about a week ago. Finally came in today because she felt like she may pass out. Complaining mild discomfort in chest. No fever or chills. No n/v. No hx of afib as far as she is aware. Known CAD. Previously saw Dr. Jacinto Halim and says he cath'd her but has not seen in several years. Hx of hypothyroid. Denies significant ETOH. Denies hx of valvular dz. No unusual leg pain or swelling.  Denies hx of blood clot.  CSN: [id number]  Arrival date & time 03/13/12  1424   First MD Initiated Contact with Patient 03/13/12 1452      Chief Complaint  Patient presents with  . Shortness of Breath    (Consider location/radiation/quality/duration/timing/severity/associated sxs/prior treatment) HPI  Past Medical History  Diagnosis Date  . Lung cancer   . Stroke   . Diabetes mellitus     History reviewed. No pertinent past surgical history.  History reviewed. No pertinent family history.  History  Substance Use Topics  . Smoking status: Never Smoker   . Smokeless tobacco: Not on file  . Alcohol Use: No    OB History    Grav Para Term Preterm Abortions TAB SAB Ect Mult Living                  Review of Systems   Review of symptoms negative unless otherwise noted in HPI.   Allergies  Review of patient's allergies indicates no known allergies.  Home Medications  No current outpatient prescriptions on file.  BP 112/67  Resp 26  SpO2 96%  Physical Exam  Nursing note and vitals reviewed. Constitutional: She is oriented to person, place, and time. She appears well-developed and well-nourished. No distress.  HENT:  Head: Normocephalic and atraumatic.  Eyes: Conjunctivae are normal. Right eye exhibits no discharge. Left eye exhibits no discharge.  Neck: Neck supple. No thyromegaly present.  Cardiovascular: Normal heart sounds.  Exam reveals no gallop and no friction rub.   No murmur heard.      Irregularly irregular tachycardia  Pulmonary/Chest:  Effort normal and breath sounds normal. No respiratory distress.  Abdominal: Soft. She exhibits no distension. There is no tenderness.  Musculoskeletal: She exhibits no edema and no tenderness.  Lymphadenopathy:    She has no cervical adenopathy.  Neurological: She is alert and oriented to person, place, and time. She exhibits normal muscle tone.  Skin: Skin is warm and dry.  Psychiatric: She has a normal mood and affect. Her behavior is normal. Thought content normal.    ED Course  Procedures (including critical care time)  Labs Reviewed  CBC - Abnormal; Notable for the following:    HCT 34.5 (*)    Platelets 147 (*)    All other components within normal limits  BASIC METABOLIC PANEL - Abnormal; Notable for the following:    Glucose, Bld 276 (*)    GFR calc non Af Amer 54 (*)    GFR calc Af Amer 62 (*)    All other components within normal limits  TROPONIN I  TSH   Dg Chest 2 View  03/13/2012  *RADIOLOGY REPORT*  Clinical Data: Shortness of breath  CHEST - 2 VIEW  Comparison: Report 02/25/11, no images available and chest x-ray 12/01/2009  Findings: Cardiomediastinal silhouette is stable.  No acute infiltrate or pulmonary edema.  Mild elevation of the right anterior diaphragm.  A nodule in the right base posteriorly measures 1.9 cm stable in size and appearance  from prior exam.  IMPRESSION: No active disease.  No significant change.  Original Report Authenticated By: Natasha Mead, M.D.   Ct Angio Chest W/cm &/or Wo Cm  03/13/2012  *RADIOLOGY REPORT*  Clinical Data: Dyspnea, new onset of atrial fibrillation, history of lung cancer  CT ANGIOGRAPHY CHEST  Technique:  Multidetector CT imaging of the chest using the standard protocol during bolus administration of intravenous contrast. Multiplanar reconstructed images including MIPs were obtained and reviewed to evaluate the vascular anatomy.  Contrast: OMNIPAQUE IOHEXOL 300 MG/ML IJ SOLN  Comparison: CT report from [facility] hospital 02/09/12  no images available  Findings: The study is of excellent technical quality.  No pulmonary embolus is identified.  Atherosclerotic calcifications of thoracic aorta are noted.  There are coronary artery calcifications.  Heart size is within normal limits.  No pericardial effusion is noted.  A precarinal lymph node measures 1.1 x 1.1 cm not pathologic by size criteria.  There is bilateral central peribronchial thickening.  A right hilar lymph node measures 1.1 x 1 cm borderline by size criteria.  Left hilar lymph node measures 1.1 x 1 cm borderline by size criteria.  Images of the lung parenchyma shows no acute infiltrate or pulmonary edema.  A dominant nodule in the right middle lobe measures 1.2 cm.  This is stable in size from prior exam according to the report.  Additional lung nodules are noted in the right middle lobe the largest measures 9 mm.  Nodules are noted bilateral lower lobe.  The largest nodule in the right lower lobe medially measures 1.8 cm.  This was not measured however by chest x-ray appearance this appears stable.  Small nodules in the left lower lobe are noted the largest in axial image 55 measures 8 mm.  No destructive bony lesions are noted.  There is diffuse osteopenia and mild degenerative changes of the thoracic spine.  The visualized upper abdomen is unremarkable.  IMPRESSION:  1.  No pulmonary embolus is noted. 2.  Borderline bilateral hilar lymph nodes. 3.   Central peribronchial thickening bilaterally.  4.  At least three or four nodules are noted in the right middle lobe the largest measures 1.2 cm.  According to the report this is stable from prior exam.  Scattered nodules are noted in the right lower lobe the largest measures 1.8 cm.  No measurement was provided on Report however by chest x-ray this is stable from prior exams.  Scattered nodules are noted in the left lower lobe the largest measures 8 mm.  Follow-up examination in 6 months is recommended to assure stability if no other  follow up was recommended by [facility].  Original Report Authenticated By: Natasha Mead, M.D.    EKG:  Rhythm: Atrial fibrillation with right ventricular rate Rate: 167 Axis: Normal axis Intervals: normal ST segments: Nonspecific ST changes. There is T-wave flattening inferiorly and laterally.  1. Atrial fibrillation with RVR   2. Dyspnea       MDM  77yF with new onset afib.  Onset likely about a week ago. Multiple risk factors for stroke including age of 89, htn, dm and hx of stroke and will need anticoagulation. Still with HR in 120s on cardizem gtt of 10mg /hr. Will continue to titrate. CT with no evidence of pulmonary embolism. Initial troponin wnl. Pt states hx of lung CA. Has known pulmonary nodules but doesn't appear that she has ever been treated? Followed at [facility] for this. Discussed case with Dr Jacinto Halim, cardiology who  pt saw years ago. Is requesting hospitalist to admit but will then follow patient.        Raeford Razor, MD 03/13/12 1759

## 2012-03-13 NOTE — Plan of Care (Signed)
Problem: Phase I Progression Outcomes Goal: Ventricular heart rate < 120/min Outcome: Completed/Met Date Met:  03/13/12 HR 90-100 Goal: Initial discharge plan identified Outcome: Completed/Met Date Met:  03/13/12 Pt to return Home with husband

## 2012-03-13 NOTE — ED Notes (Addendum)
Lung cancer pt with increasing sob for one week worse with exertion HR 150. SPO2 on RA 95% c/o epigastric pain and chest pain.

## 2012-03-13 NOTE — H&P (Signed)
[name] [phone]  Outpatient Primary MD for the patient is No primary provider on file.  With History of -  Past Medical History  Diagnosis Date  . Lung cancer   . Stroke   . Diabetes mellitus   . CAD (coronary artery disease) 03/13/2012  . Type II or unspecified type diabetes mellitus without mention of complication, not stated as uncontrolled 03/13/2012  . Hypothyroid 03/13/2012  . HTN (hypertension) 03/13/2012      History reviewed. No pertinent past surgical history.  in for   Chief Complaint  Patient presents with  . Shortness of Breath     HPI  [name]  is a 78 y.o. female, with H/O Lung cancer and goes to [facility] for it, in control for 9 yrs, CAD, CVA,DM-2, HTN, comes in with few month history of palpitations vague generalized chest heaviness  and SOB, symptoms worse with exertion better with rest, comes in as symptoms gradually getting worse, in ER found to be in Afib-RVR. No fevers, no chills, no diarrhea-Nausea or vomiting.  Case D/W Dr Jacinto Halim who will see the patient in am.    Review of Systems    In addition to the HPI above,   No Fever-chills, No Headache, No changes with Vision or hearing, No problems swallowing food or Liquids, + Chest pain chest dyscomfort & palpitations, + Shortness of Breath, No Abdominal pain, No Nausea or Vommitting, Bowel movements are regular, No Blood in stool or Urine, No dysuria, No new skin rashes or bruises, No new joints pains-aches,  No new weakness, tingling, numbness in any extremity, No recent weight gain or loss, No polyuria, polydypsia or polyphagia, No significant Mental Stressors.  A full 10 point Review of Systems was done, except as stated above, all other Review of Systems were negative.   Social History History  Substance Use Topics  . Smoking status: Never Smoker   . Smokeless tobacco: Not on file  . Alcohol Use: No      Family History No Lung CA  Prior to Admission  medications   Medication Sig Start Date End Date Taking? Authorizing Provider  aspirin EC 81 MG tablet Take 81 mg by mouth daily.   Yes Historical Provider, MD  calcium-vitamin D (OSCAL WITH D) 500-200 MG-UNIT per tablet Take 1 tablet by mouth daily.   Yes Historical Provider, MD  isosorbide mononitrate (IMDUR) 60 MG 24 hr tablet Take 60 mg by mouth daily.   Yes Historical Provider, MD  levothyroxine (SYNTHROID, LEVOTHROID) 125 MCG tablet Take 125 mcg by mouth daily.   Yes Historical Provider, MD  meclizine (ANTIVERT) 25 MG tablet Take 25 mg by mouth 4 (four) times daily.   Yes Historical Provider, MD  metFORMIN (GLUCOPHAGE) 500 MG tablet Take 500 mg by mouth 2 (two) times daily with a meal.   Yes Historical Provider, MD  metoprolol succinate (TOPROL-XL) 25 MG 24 hr tablet Take 25 mg by mouth daily.   Yes Historical Provider, MD  potassium chloride SA (K-DUR,KLOR-CON) 20 MEQ tablet Take 20 mEq by mouth daily.   Yes Historical Provider, MD  valsartan (DIOVAN) 320 MG tablet Take 320 mg by mouth daily.   Yes Historical Provider, MD    No Known Allergies  Physical Exam  Vitals  Blood pressure 101/58, pulse 130, resp. rate 19, SpO2 93.00%.   1. General elderly white female lying in bed in NAD,     2. Normal affect and insight, Not Suicidal or Homicidal, Awake Alert, Oriented *3.  3. No F.N deficits, ALL C.Nerves Intact, Strength 5/5 all 4 extremities, Sensation intact all 4 extremities, Plantars down going.  4. Ears and Eyes appear Normal, Conjunctivae clear, PERRLA. Moist Oral Mucosa.  5. Supple Neck, No JVD, No cervical lymphadenopathy appriciated, No Carotid Bruits.  6. Symmetrical Chest wall movement, Good air movement bilaterally, CTAB.  7. iRRR, No Gallops, Rubs or Murmurs, No Parasternal Heave.  8. Positive Bowel Sounds, Abdomen Soft, Non tender, No organomegaly appriciated,       No rebound -guarding or rigidity.  9.  No Cyanosis, Normal Skin Turgor, No Skin Rash or  Bruise.  10. Good muscle tone,  joints appear normal , no effusions, Normal ROM.  11. No Palpable Lymph Nodes in Neck or Axillae     Data Review  CBC  Lab 03/13/12 1516  WBC 7.0  HGB 12.4  HCT 34.5*  PLT 147*  MCV 85.4  MCH 30.7  MCHC 35.9  RDW 12.4  LYMPHSABS --  MONOABS --  EOSABS --  BASOSABS --  BANDABS --   ------------------------------------------------------------------------------------------------------------------ Chemistries   Lab 03/13/12 1516  NA 138  K 4.1  CL 101  CO2 25  GLUCOSE 276*  BUN 14  CREATININE 1.00  CALCIUM 9.2  MG --  AST --  ALT --  ALKPHOS --  BILITOT --   ------------------------------------------------------------------------------------------------------------------ CrCl is unknown because there is no height on file for the current visit. ------------------------------------------------------------------------------------------------------------------ No results found for this basename: TSH,T4TOTAL,FREET3,T3FREE,THYROIDAB in the last 72 hours  Coagulation profile No results found for this basename: INR:5,PROTIME:5 in the last 168 hours ------------------------------------------------------------------------------------------------------------------- No results found for this basename: DDIMER:2 in the last 72 hours ------------------------------------------------------------------------------------------------------------------- Cardiac Enzymes  Lab 03/13/12 1516  CKMB --  TROPONINI <0.30  MYOGLOBIN --   ------------------------------------------------------------------------------------------------------------------ No components found with this basename: POCBNP:3 ------------------------------------------------------------------------------------------------------------------  Imaging results:   Dg Chest 2 View  03/13/2012  *RADIOLOGY REPORT*  Clinical Data: Shortness of breath  CHEST - 2 VIEW  Comparison: Report  02/25/11, no images available and chest x-ray 12/01/2009  Findings: Cardiomediastinal silhouette is stable.  No acute infiltrate or pulmonary edema.  Mild elevation of the right anterior diaphragm.  A nodule in the right base posteriorly measures 1.9 cm stable in size and appearance from prior exam.  IMPRESSION: No active disease.  No significant change.  Original Report Authenticated By: Natasha Mead, M.D.   Ct Angio Chest W/cm &/or Wo Cm  03/13/2012  *RADIOLOGY REPORT*  Clinical Data: Dyspnea, new onset of atrial fibrillation, history of lung cancer  CT ANGIOGRAPHY CHEST  Technique:  Multidetector CT imaging of the chest using the standard protocol during bolus administration of intravenous contrast. Multiplanar reconstructed images including MIPs were obtained and reviewed to evaluate the vascular anatomy.  Contrast: OMNIPAQUE IOHEXOL 300 MG/ML IJ SOLN  Comparison: CT report from [facility] hospital 02/09/12 no images available  Findings: The study is of excellent technical quality.  No pulmonary embolus is identified.  Atherosclerotic calcifications of thoracic aorta are noted.  There are coronary artery calcifications.  Heart size is within normal limits.  No pericardial effusion is noted.  A precarinal lymph node measures 1.1 x 1.1 cm not pathologic by size criteria.  There is bilateral central peribronchial thickening.  A right hilar lymph node measures 1.1 x 1 cm borderline by size criteria.  Left hilar lymph node measures 1.1 x 1 cm borderline by size criteria.  Images of the lung parenchyma shows no acute infiltrate or pulmonary edema.  A dominant  nodule in the right middle lobe measures 1.2 cm.  This is stable in size from prior exam according to the report.  Additional lung nodules are noted in the right middle lobe the largest measures 9 mm.  Nodules are noted bilateral lower lobe.  The largest nodule in the right lower lobe medially measures 1.8 cm.  This was not measured however by chest x-ray  appearance this appears stable.  Small nodules in the left lower lobe are noted the largest in axial image 55 measures 8 mm.  No destructive bony lesions are noted.  There is diffuse osteopenia and mild degenerative changes of the thoracic spine.  The visualized upper abdomen is unremarkable.  IMPRESSION:  1.  No pulmonary embolus is noted. 2.  Borderline bilateral hilar lymph nodes. 3.   Central peribronchial thickening bilaterally.  4.  At least three or four nodules are noted in the right middle lobe the largest measures 1.2 cm.  According to the report this is stable from prior exam.  Scattered nodules are noted in the right lower lobe the largest measures 1.8 cm.  No measurement was provided on Report however by chest x-ray this is stable from prior exams.  Scattered nodules are noted in the left lower lobe the largest measures 8 mm.  Follow-up examination in 6 months is recommended to assure stability if no other follow up was recommended by [facility].  Original Report Authenticated By: Natasha Mead, M.D.    My personal review of EKG: Rhythm A.Fib, Rate 167 /min, QTc 467 , non specific ST changes     Assessment & Plan  Principal Problem:  *Atrial fibrillation with RVR Active Problems:  Lung cancer  Stroke  HTN (hypertension)  Hypothyroid  Type II or unspecified type diabetes mellitus without mention of complication, not stated as uncontrolled  CAD (coronary artery disease)    1.Atrial Fib with RVR - in a pt with CAD, HTN, DM-2 with some rate related chest discomfort -  Admit , Cardizem drip, PO Lopressor overlap, reduce ACE dose to have room in BP to go up on B blocker, Xaralto, R/O MI, Echo, TSH, D/W Dr Jacinto Halim, who agrees with the plan and see in am.   2.DM-2 - A1c, hold glucophage, ISS   3.Lung CA - outpt [facility] follow along with CT report from here.   4.H/O CVA - no acute issue continue ASA, outpt risk factor modulation.   5.Hypothyroidsm - home synthroid dose, check  TSH.   DVT Prophylaxis Xaralto - SCDs    AM Labs Ordered, also please review Full Orders  Admission, patients condition and plan of care including tests being ordered have been discussed with the patient who indicates understanding and agree with the plan and Code Status.  Code Status DNI  Condition Marinell Blight K M.D on 03/13/2012 at 7:11 PM  [facility] Office  [phone]

## 2012-03-13 NOTE — ED Notes (Signed)
MD Singh at bedside.

## 2012-03-14 ENCOUNTER — Other Ambulatory Visit: Payer: Self-pay

## 2012-03-14 LAB — BASIC METABOLIC PANEL
Calcium: 8.5 mg/dL (ref 8.4–10.5)
GFR calc Af Amer: 66 mL/min — ABNORMAL LOW (ref >90)
GFR calc non Af Amer: 57 mL/min — ABNORMAL LOW (ref >90)
Glucose, Bld: 233 mg/dL — ABNORMAL HIGH (ref 70–99)
Potassium: 3.6 mEq/L (ref 3.5–5.1)
Sodium: 137 mEq/L (ref 135–145)

## 2012-03-14 LAB — TSH: TSH: 1.158 u[IU]/mL (ref 0.350–4.500)

## 2012-03-14 LAB — GLUCOSE, CAPILLARY
Glucose-Capillary: 388 mg/dL — ABNORMAL HIGH (ref 70–99)
Glucose-Capillary: 200 mg/dL — ABNORMAL HIGH (ref 70–99)

## 2012-03-14 LAB — CARDIAC PANEL(CRET KIN+CKTOT+MB+TROPI)
Relative Index: INVALID (ref 0.0–2.5)
Relative Index: INVALID (ref 0.0–2.5)
Total CK: 66 U/L (ref 7–177)
Troponin I: <0.3 ng/mL (ref <0.30)

## 2012-03-14 LAB — CBC
Hemoglobin: 12.1 g/dL (ref 12.0–15.0)
Platelets: 147 10*3/uL — ABNORMAL LOW (ref 150–400)
RBC: 3.92 MIL/uL (ref 3.87–5.11)
WBC: 5.6 10*3/uL (ref 4.0–10.5)

## 2012-03-14 LAB — HEMOGLOBIN A1C
Hgb A1c MFr Bld: 9.7 % — ABNORMAL HIGH (ref <5.7)
Mean Plasma Glucose: 232 mg/dL — ABNORMAL HIGH (ref <117)

## 2012-03-14 MED ORDER — INSULIN ASPART 100 UNIT/ML ~~LOC~~ SOLN
3.0000 [IU] | Freq: Three times a day (TID) | SUBCUTANEOUS | Status: DC
  Administered 2012-03-14 – 2012-03-17 (×9): 3 [IU] via SUBCUTANEOUS

## 2012-03-14 MED ORDER — PROMETHAZINE HCL 25 MG PO TABS
12.5000 mg | ORAL_TABLET | Freq: Three times a day (TID) | ORAL | Status: DC | PRN
  Administered 2012-03-14: 12.5 mg via ORAL
  Filled 2012-03-14: qty 1

## 2012-03-14 MED ORDER — INSULIN ASPART 100 UNIT/ML ~~LOC~~ SOLN
0.0000 [IU] | Freq: Three times a day (TID) | SUBCUTANEOUS | Status: DC
  Administered 2012-03-14: 15 [IU] via SUBCUTANEOUS
  Administered 2012-03-15 (×2): 3 [IU] via SUBCUTANEOUS
  Administered 2012-03-15: 5 [IU] via SUBCUTANEOUS
  Administered 2012-03-16 (×2): 3 [IU] via SUBCUTANEOUS
  Administered 2012-03-16: 8 [IU] via SUBCUTANEOUS
  Administered 2012-03-17: 3 [IU] via SUBCUTANEOUS
  Administered 2012-03-17: 5 [IU] via SUBCUTANEOUS

## 2012-03-14 MED ORDER — DILTIAZEM HCL 60 MG PO TABS
60.0000 mg | ORAL_TABLET | Freq: Four times a day (QID) | ORAL | Status: DC
  Administered 2012-03-14: 60 mg via ORAL
  Filled 2012-03-14 (×5): qty 1

## 2012-03-14 MED ORDER — DILTIAZEM HCL 30 MG PO TABS
30.0000 mg | ORAL_TABLET | Freq: Four times a day (QID) | ORAL | Status: DC
  Administered 2012-03-14 – 2012-03-15 (×6): 30 mg via ORAL
  Filled 2012-03-14 (×10): qty 1

## 2012-03-14 NOTE — Progress Notes (Signed)
Pt had 2.13sec pause, BP 90/63, MD notified and Cardizem drip d/c'ed per order parameters. Will continue to monitor patient. Daphine Deutscher, Miranda Kingston

## 2012-03-14 NOTE — Progress Notes (Signed)
  Echocardiogram 2D Echocardiogram has been performed.  Jorje Guild [facility] 03/14/2012, 9:25 AM

## 2012-03-14 NOTE — Progress Notes (Signed)
[name] [id number] is a 78 y.o. female,  Outpatient Primary MD for the patient is No primary provider on file.  Chief Complaint  Patient presents with  . Shortness of Breath        Subjective:   [name] today has, No headache, some ongoing vague chest dyscomfort, No abdominal pain -  No new weakness tingling or numbness, No Cough - SOB.    Objective:   Filed Vitals:   03/14/12 0205 03/14/12 0248 03/14/12 0439 03/14/12 0602  BP: 97/70 101/67  114/81  Pulse: 91  83 90  Temp: 98.1 F (36.7 C)  97.9 F (36.6 C)   TempSrc: Oral  Oral   Resp: 18  19   Height:      Weight:   67.994 kg (149 lb 14.4 oz)   SpO2: 94%  95%     Wt Readings from Last 3 Encounters:  03/14/12 67.994 kg (149 lb 14.4 oz)     Intake/Output Summary (Last 24 hours) at 03/14/12 1036 Last data filed at 03/14/12 0751  Gross per 24 hour  Intake 739.58 ml  Output      0 ml  Net 739.58 ml    Exam Awake Alert, Oriented *3, No new F.N deficits, Normal affect [place].AT,PERRAL Supple Neck,No JVD, No cervical lymphadenopathy appriciated.  Symmetrical Chest wall movement, Good air movement bilaterally, CTAB iRRR,No Gallops,Rubs or new Murmurs, No Parasternal Heave +ve B.Sounds, Abd Soft, Non tender, No organomegaly appriciated, No rebound -guarding or rigidity. No Cyanosis, Clubbing or edema, No new Rash or bruise     Data Review  CBC  Lab 03/14/12 0620 03/13/12 1516  WBC 5.6 7.0  HGB 12.1 12.4  HCT 34.3* 34.5*  PLT 147* 147*  MCV 87.5 85.4  MCH 30.9 30.7  MCHC 35.3 35.9  RDW 12.7 12.4  LYMPHSABS -- --  MONOABS -- --  EOSABS -- --  BASOSABS -- --  BANDABS -- --    Chemistries   Lab 03/14/12 0620 03/13/12 1516  NA 137 138  K 3.6 4.1  CL 102 101  CO2 24 25  GLUCOSE 233* 276*  BUN 11 14  CREATININE 0.95 1.00  CALCIUM 8.5 9.2  MG -- --  AST -- --  ALT -- --  ALKPHOS -- --  BILITOT -- --    ------------------------------------------------------------------------------------------------------------------ estimated creatinine clearance is 47.3 ml/min (by C-G formula based on Cr of 0.95). ------------------------------------------------------------------------------------------------------------------  Basename 03/13/12 2200  HGBA1C 9.7*   ------------------------------------------------------------------------------------------------------------------ No results found for this basename: CHOL:2,HDL:2,LDLCALC:2,TRIG:2,CHOLHDL:2,LDLDIRECT:2 in the last 72 hours ------------------------------------------------------------------------------------------------------------------  [facility] 03/13/12 1516  TSH 1.742  T4TOTAL --  T3FREE --  THYROIDAB --   ------------------------------------------------------------------------------------------------------------------ No results found for this basename: VITAMINB12:2,FOLATE:2,FERRITIN:2,TIBC:2,IRON:2,RETICCTPCT:2 in the last 72 hours  Coagulation profile  Lab 03/13/12 2200  INR 1.07  PROTIME --    No results found for this basename: DDIMER:2 in the last 72 hours  Cardiac Enzymes  Lab 03/14/12 0620 03/13/12 2200 03/13/12 1516  CKMB 2.1 2.5 --  TROPONINI <0.30 <0.30 <0.30  MYOGLOBIN -- -- --   ------------------------------------------------------------------------------------------------------------------ No components found with this basename: POCBNP:3  Micro Results No results found for this or any previous visit (from the past 240 hour(s)).  Radiology Reports Dg Chest 2 View  03/13/2012  *RADIOLOGY REPORT*  Clinical Data: Shortness of breath  CHEST - 2 VIEW  Comparison: Report 02/25/11, no images available and chest x-ray 12/01/2009  Findings: Cardiomediastinal silhouette is stable.  No acute infiltrate or pulmonary edema.  Mild elevation  of the right anterior diaphragm.  A nodule in the right base posteriorly measures  1.9 cm stable in size and appearance from prior exam.  IMPRESSION: No active disease.  No significant change.  Original Report Authenticated By: Natasha Mead, M.D.   Ct Angio Chest W/cm &/or Wo Cm  03/13/2012  *RADIOLOGY REPORT*  Clinical Data: Dyspnea, new onset of atrial fibrillation, history of lung cancer  CT ANGIOGRAPHY CHEST  Technique:  Multidetector CT imaging of the chest using the standard protocol during bolus administration of intravenous contrast. Multiplanar reconstructed images including MIPs were obtained and reviewed to evaluate the vascular anatomy.  Contrast: OMNIPAQUE IOHEXOL 300 MG/ML IJ SOLN  Comparison: CT report from [facility] hospital 02/09/12 no images available  Findings: The study is of excellent technical quality.  No pulmonary embolus is identified.  Atherosclerotic calcifications of thoracic aorta are noted.  There are coronary artery calcifications.  Heart size is within normal limits.  No pericardial effusion is noted.  A precarinal lymph node measures 1.1 x 1.1 cm not pathologic by size criteria.  There is bilateral central peribronchial thickening.  A right hilar lymph node measures 1.1 x 1 cm borderline by size criteria.  Left hilar lymph node measures 1.1 x 1 cm borderline by size criteria.  Images of the lung parenchyma shows no acute infiltrate or pulmonary edema.  A dominant nodule in the right middle lobe measures 1.2 cm.  This is stable in size from prior exam according to the report.  Additional lung nodules are noted in the right middle lobe the largest measures 9 mm.  Nodules are noted bilateral lower lobe.  The largest nodule in the right lower lobe medially measures 1.8 cm.  This was not measured however by chest x-ray appearance this appears stable.  Small nodules in the left lower lobe are noted the largest in axial image 55 measures 8 mm.  No destructive bony lesions are noted.  There is diffuse osteopenia and mild degenerative changes of the thoracic spine.  The  visualized upper abdomen is unremarkable.  IMPRESSION:  1.  No pulmonary embolus is noted. 2.  Borderline bilateral hilar lymph nodes. 3.   Central peribronchial thickening bilaterally.  4.  At least three or four nodules are noted in the right middle lobe the largest measures 1.2 cm.  According to the report this is stable from prior exam.  Scattered nodules are noted in the right lower lobe the largest measures 1.8 cm.  No measurement was provided on Report however by chest x-ray this is stable from prior exams.  Scattered nodules are noted in the left lower lobe the largest measures 8 mm.  Follow-up examination in 6 months is recommended to assure stability if no other follow up was recommended by [facility].  Original Report Authenticated By: Natasha Mead, M.D.    Scheduled Meds:   . aspirin  324 mg Oral Once  . aspirin EC  81 mg Oral Daily  . calcium-vitamin D  1 tablet Oral Daily  . diltiazem (CARDIZEM) infusion  5 mg/hr Intravenous Once  . diltiazem  15 mg Intravenous Once  . diltiazem  60 mg Oral Q6H  . gi cocktail  30 mL Oral Once  . insulin aspart  0-15 Units Subcutaneous TID WC  . insulin aspart  3 Units Subcutaneous TID WC  . isosorbide mononitrate  60 mg Oral Daily  . levothyroxine  125 mcg Oral QAC breakfast  . meclizine  25 mg Oral QID  .  metoprolol tartrate  25 mg Oral Q8H  . olmesartan  10 mg Oral Daily  . rivaroxaban  20 mg Oral QHS  . sodium chloride  500 mL Intravenous Once  . sodium chloride  3 mL Intravenous Q12H  . DISCONTD: insulin aspart  0-9 Units Subcutaneous TID WC  . DISCONTD: nitroGLYCERIN  1 inch Topical Q6H   Continuous Infusions:   . sodium chloride    . sodium chloride 50 mL/hr at 03/13/12 2123  . DISCONTD: diltiazem (CARDIZEM) infusion 10 mg/hr (03/14/12 0412)   PRN Meds:.albuterol, diltiazem, guaiFENesin-dextromethorphan, HYDROcodone-acetaminophen, iohexol, ondansetron (ZOFRAN) IV, ondansetron (ZOFRAN) IV, ondansetron,  promethazine  Assessment & Plan    1.Atrial Fib with RVR - in a pt with CAD, HTN, DM-2 with some rate related chest discomfort - ruled out for MI, in RC, on Po Lopressor and Xaralto, stable TSH-Trop, lytes, Echo done, still some vague chest discomfort ongoing for 1 mth or more, Cards to see. Will repeat EKG now Dr Jacinto Halim to see soon.   2.DM-2 - A1c high, hold glucophage, ISS increased, premeal Insulin added.  Lab Results  Component Value Date   HGBA1C 9.7* 03/13/2012     CBG (last 3)   Basename 03/14/12 0752 03/13/12 2046  GLUCAP 245* 185*      3.Lung CA - outpt [facility] follow along with CT report from here.    4.H/O CVA - no acute issue continue ASA, outpt risk factor modulation.    5.Hypothyroidsm - home synthroid dose, stable TSH.    6.Mild nausea with Narco - added as alergy, zofran/phenergan, EKG now.    7. 2 sec pause on Tele - DC Cardizem, will leave on B Blocker, increase dose if needed, monitor.     DVT Prophylaxis  Lesia Hausen  -  SCDs      Leroy Sea M.D on 03/14/2012 at 10:36 AM  [facility] Office  [phone]

## 2012-03-14 NOTE — Consult Note (Signed)
CARDIOLOGY CONSULT NOTE  Patient ID: [name] MRN: [id number] DOB/AGE: Mar 15, 1934 78 y.o.  Admit date: 03/13/2012 Referring Physician P. Thedore Mins, MD Primary Physician: Adela Lank, MD  Reason for Consultation A. Fibrillation  HPI: Patient is 89 YW female with history of hypertension, diabetes and a rare form of lung cancer being followed at [facility]. She has had remote stroke. She was doing well until about 2 weeks ago had noticed worsening shortness of breath. She is also developed orthopnea. With this she presented to the emergency room yesterday and was found to be in atrial fibrillation with rapid response. She was started on beta blockers and calcium channel blocker and heart rate got better. I've been consulted to evaluate and further treat. Patient states that she has had a remote cardiac catheterization which I had performed and she was told to have a 80% blockage the heart arteries but was recommended medical therapy. She's been doing well and has not been using any nitroglycerin. She does admit to having chest pain going on for last several weeks which she states feels like dryness in the middle of her chest, pain in both the axillary areas. Chest pain comes with a without exertional activities. No other associated symptoms with this. There is no change in the symptoms over the last several weeks.  Past Medical History  Diagnosis Date  . Lung cancer   . Stroke   . Diabetes mellitus   . CAD (coronary artery disease) 03/13/2012  . Type II or unspecified type diabetes mellitus without mention of complication, not stated as uncontrolled 03/13/2012  . Hypothyroid 03/13/2012  . HTN (hypertension) 03/13/2012     History reviewed. No pertinent past surgical history.   History reviewed. No pertinent family history.  Social History: History   Social History  . Marital Status: Married    Spouse Name: N/A    Number of Children: N/A  . Years of Education: N/A     Occupational History  . Not on file.   Social History Main Topics  . Smoking status: Never Smoker   . Smokeless tobacco: Not on file  . Alcohol Use: No  . Drug Use:   . Sexually Active:    Other Topics Concern  . Not on file   Social History Narrative  . No narrative on file     Prescriptions prior to admission  Medication Sig Dispense Refill  . aspirin EC 81 MG tablet Take 81 mg by mouth daily.      . calcium-vitamin D (OSCAL WITH D) 500-200 MG-UNIT per tablet Take 1 tablet by mouth daily.      . isosorbide mononitrate (IMDUR) 60 MG 24 hr tablet Take 60 mg by mouth daily.      Marland Kitchen levothyroxine (SYNTHROID, LEVOTHROID) 125 MCG tablet Take 125 mcg by mouth daily.      . meclizine (ANTIVERT) 25 MG tablet Take 25 mg by mouth 4 (four) times daily.      . metFORMIN (GLUCOPHAGE) 500 MG tablet Take 500 mg by mouth 2 (two) times daily with a meal.      . metoprolol succinate (TOPROL-XL) 25 MG 24 hr tablet Take 25 mg by mouth daily.      . potassium chloride SA (K-DUR,KLOR-CON) 20 MEQ tablet Take 20 mEq by mouth daily.      . valsartan (DIOVAN) 320 MG tablet Take 320 mg by mouth daily.        ROS: General: no fevers/chills/night sweats Eyes: no blurry  vision, diplopia, or amaurosis ENT: no sore throat or hearing loss Resp: no cough, wheezing, or hemoptysis. Does have significant shortness of breath over the last 2 weeks. Has orthopnea but no PND. CV: no edema or palpitations GI: no abdominal pain, nausea, vomiting, diarrhea, or constipation GU: no dysuria, frequency, or hematuria Skin: no rash Neuro: no headache, numbness, tingling, or weakness of extremities Musculoskeletal: no joint pain or swelling Heme: no bleeding, DVT, or easy bruising Endo: no polydipsia or polyuria    Physical Exam: Blood pressure 114/81, pulse 90, temperature 97.9 F (36.6 C), temperature source Oral, resp. rate 19, height 5\' 3"  (1.6 m), weight 67.994 kg (149 lb 14.4 oz), SpO2 95.00%.  Pt is alert  and oriented, WD, WN, in no distress. HEENT: normal Neck: JVP normal. Carotid upstrokes normal without bruits. No thyromegaly. Lungs: equal expansion, there is faint bilateral basal crackles.  CV: S1 is variable, S2 is normal. There is no gallop or murmur appreciated. Abd: soft, NT, +BS, no bruit, no hepatosplenomegaly Back: no CVA tenderness Ext: no C/C/E        Femoral pulses 2+= without bruits        DP/PT pulses intact and = Skin: warm and dry without rash Neuro: CNII-XII intact             Strength intact = bilaterally  Labs:   Lab Results  Component Value Date   WBC 5.6 03/14/2012   HGB 12.1 03/14/2012   HCT 34.3* 03/14/2012   MCV 87.5 03/14/2012   PLT 147* 03/14/2012    Lab 03/14/12 0620  NA 137  K 3.6  CL 102  CO2 24  BUN 11  CREATININE 0.95  CALCIUM 8.5  PROT --  BILITOT --  ALKPHOS --  ALT --  AST --  GLUCOSE 233*   Lab Results  Component Value Date   CKTOTAL 64 03/14/2012   CKMB 2.1 03/14/2012   TROPONINI <0.30 03/14/2012       Radiology:  IMPRESSION:  1.  No pulmonary embolus is noted. 2.  Borderline bilateral hilar lymph nodes. 3.   Central peribronchial thickening bilaterally.  4.  At least three or four nodules are noted in the right middle lobe the largest measures 1.2 cm.  According to the report this is stable from prior exam.  Scattered nodules are noted in the right lower lobe the largest measures 1.8 cm.  No measurement was provided on Report however by chest x-ray this is stable from prior exams.  Scattered nodules are noted in the left lower lobe the largest measures 8 mm.  Follow-up examination in 6 months is recommended to assure stability if no other follow up was recommended by [facility].  Original Report Authenticated By: Natasha Mead, M.D.   EKG: Atrial fibrillation with controlled ventricular response with a V. rate of 88 beats per minute Telemetry reveals atrial fibrillation with controlled ventricular response. No significant process  (2.1 seconds). Echocardiogram done on 12/03/2009 normal left systolic function. Ejection fraction 55-60%. Probable mild grade stenoses, probable bicuspid aortic valve, moderate aortic regurgitation, mild pulmonary Hypertension.  ASSESSMENT AND PLAN:   1. Atrial fibrillation with rapid response, probable onset 2 weeks ago with development of marked dyspnea on exertion and orthopnea. 2. Hypertension controlled 3. Hyperlipidemia 4. Diabetes mellitus type 2 uncontrolled, HbA1c 9.7 5. History of rare wall of lung cancer being followed at [facility]. CT scan done yesterday is stable compared to prior CT scan done in February of 2013. No pulmonary  embolism. 6. Shortness of breath and dyspnea on exertion. I suspect this is due to an underlying lung pathology and also due to atrial fibrillation with rapid response. 7. Chest pain which appears to be muscles musculoskeletal and atypical. Negative cardiac markers in spite of chest pain ongoing for the last several weeks which is continuous. I also do not think this is pericarditis as I do not hear any rub, no EKG changes or pericarditis. With history of lung cancer pericardial effusion is a possibility, however CT scan did not reveal any effusion. Check echocardiogram.  Recommendation: Patient's heart rate is much better controlled with the beta blocker therapy. Continue the same and watch her overnight and ambulate her. If she remains asymptomatic she can be discharged home with outpatient evaluation with stress testing. I evaluate her echocardiogram that was done earlier this morning and unless this shows significant abnormalities including low ejection fraction or significant wall motion abnormality then she'll need inpatient cardiac workup. I agree with Xarelto for anticoagulation especially with the history of prior stroke remotely, diabetes. I would also obtain a TSH, BNP to exclude significant congestive heart failure. I suspect she probably has mild  diastolic heart failure due to hypertension, presence of atrial fibrillation with rapid response.  Pamella Pert, MD 03/14/2012, 11:42 AM

## 2012-03-14 NOTE — Progress Notes (Signed)
Pt arrived to floor with cardizem drip at 15mg /ml per hour. Pt's HR 100-115. VS stable. Prn bolus dose for HR >125 not needed at this time. Will continue to closely monitor patient. Daphine Deutscher, Miranda [place]

## 2012-03-14 NOTE — Progress Notes (Signed)
Pt had 2.08 sec pause. VS stable. MD notified and orders received to titrate drip to 10mg /hr. Will continue to monitor pt closely for pauses. Daphine Deutscher, Miranda [place]

## 2012-03-15 LAB — GLUCOSE, CAPILLARY
Glucose-Capillary: 235 mg/dL — ABNORMAL HIGH (ref 70–99)
Glucose-Capillary: 267 mg/dL — ABNORMAL HIGH (ref 70–99)

## 2012-03-15 MED ORDER — DIGOXIN 0.25 MG/ML IJ SOLN
0.2500 mg | Freq: Once | INTRAMUSCULAR | Status: AC
  Administered 2012-03-15: 0.25 mg via INTRAVENOUS
  Filled 2012-03-15 (×2): qty 1

## 2012-03-15 MED ORDER — DIGOXIN 0.25 MG/ML IJ SOLN
0.1250 mg | Freq: Four times a day (QID) | INTRAMUSCULAR | Status: DC
  Filled 2012-03-15 (×4): qty 0.5

## 2012-03-15 MED ORDER — DIGOXIN 125 MCG PO TABS
0.1250 mg | ORAL_TABLET | Freq: Every day | ORAL | Status: DC
  Administered 2012-03-15: 0.125 mg via ORAL
  Filled 2012-03-15: qty 1

## 2012-03-15 MED ORDER — DIGOXIN 125 MCG PO TABS
0.1250 mg | ORAL_TABLET | Freq: Every day | ORAL | Status: DC
  Administered 2012-03-17: 0.125 mg via ORAL
  Filled 2012-03-15: qty 1

## 2012-03-15 MED ORDER — DILTIAZEM HCL 30 MG PO TABS
30.0000 mg | ORAL_TABLET | Freq: Once | ORAL | Status: AC
  Administered 2012-03-15: 30 mg via ORAL
  Filled 2012-03-15: qty 1

## 2012-03-15 MED ORDER — DIGOXIN 125 MCG PO TABS: 0.1250 mg | ORAL_TABLET | Freq: Every day | ORAL | Status: DC

## 2012-03-15 MED ORDER — DIGOXIN 0.25 MG/ML IJ SOLN
0.1250 mg | Freq: Four times a day (QID) | INTRAMUSCULAR | Status: AC
  Administered 2012-03-16: 0.125 mg via INTRAVENOUS
  Filled 2012-03-15: qty 0.5

## 2012-03-15 MED ORDER — RIVAROXABAN 15 MG PO TABS
15.0000 mg | ORAL_TABLET | Freq: Every day | ORAL | Status: DC
  Administered 2012-03-15 – 2012-03-16 (×2): 15 mg via ORAL
  Filled 2012-03-15 (×4): qty 1

## 2012-03-15 NOTE — Progress Notes (Addendum)
Subjective:   Feels much better. Slept well. No chest pain or dyspnea.  Objective:  Vital Signs in the last 24 hours: Temp:  [98.1 F (36.7 C)-98.3 F (36.8 C)] 98.2 F (36.8 C) (03/18 0500) Pulse Rate:  [92-130] 97  (03/18 0500) Resp:  [16-20] 16  (03/18 0500) BP: (94-129)/(56-85) 112/75 mmHg (03/18 0500) SpO2:  [92 %-96 %] 92 % (03/18 0500) Weight:  [67.9 kg (149 lb 11.1 oz)] 67.9 kg (149 lb 11.1 oz) (03/18 0500)  Intake/Output from previous day: 03/17 0701 - 03/18 0700 In: 1459.6 [P.O.:720; I.V.:739.6] Out: 1000 [Urine:1000]  Physical Exam:   General appearance: alert, cooperative, appears stated age and no distress Eyes: conjunctivae/corneas clear. PERRL, EOM's intact. Fundi benign. Neck: no adenopathy, no carotid bruit, no JVD, supple, symmetrical, trachea midline and thyroid not enlarged, symmetric, no tenderness/mass/nodules Neck: JVP - normal, carotids 2+= without bruits Resp: wheezes bibasilar and bilaterally Chest wall: no tenderness, left sided chest wall tenderness Cardio: S1 variable, S2 normal. no murmur. Tachycardia present GI: soft, non-tender; bowel sounds normal; no masses,  no organomegaly Extremities: extremities normal, atraumatic, no cyanosis or edema    Lab Results:  Basename 03/14/12 0620 03/13/12 1516  WBC 5.6 7.0  HGB 12.1 12.4  PLT 147* 147*    Basename 03/14/12 0620 03/13/12 1516  NA 137 138  K 3.6 4.1  CL 102 101  CO2 24 25  GLUCOSE 233* 276*  BUN 11 14  CREATININE 0.95 1.00    Basename 03/14/12 1155 03/14/12 0620  TROPONINI <0.30 <0.30      Cardiac Studies: BNP elevated suggests acute on chronic diastolic heart failure.   Assessment/Plan:   1. Atrial fibrillation with rapid response, probable onset 2 weeks ago with development of marked dyspnea on exertion and orthopnea. Dyspnea improved and states she slept well for the first time in 2 weeks.  2. Hypertension controlled  3. Hyperlipidemia  4. Diabetes mellitus type 2  uncontrolled, HbA1c 9.7  5. History of rare wall of lung cancer being followed at [facility]. CT scan done yesterday is stable compared to prior CT scan done in February of 2013. No pulmonary embolism.  6. Shortness of breath and dyspnea on exertion. I suspect this is due to an underlying lung pathology and also due to atrial fibrillation with rapid response.  7. Chest pain which appears to be muscles musculoskeletal and atypical. Easily reproducible.  8. Acute on chronic diastolic heart failure. Clinically better today. Exacerbated by A. fib  Rec: Will add low dose dig and back off on cardizem if she were to develop bradycardia. No heart block noted on dual negative chronotropic agents. Continue xarelto.    Pamella Pert, M.D. 03/15/2012, 9:23 AM

## 2012-03-15 NOTE — Progress Notes (Signed)
Inpatient Diabetes Program Recommendations  AACE/ADA: New Consensus Statement on Inpatient Glycemic Control  Target Ranges:  Prepandial:   less than 140 mg/dL      Peak postprandial:   less than 180 mg/dL (1-2 hours)      Critically ill patients:  140 - 180 mg/dL  Pager:  [phone] Hours:  8 am-10pm   Reason for Visit: Elevated fasting glucose: 235 mg/dL  Inpatient Diabetes Program Recommendations Insulin - Basal: Add Lantus 15 units daily Outpatient Referral: Please order outpatient diabetes education:  HgbA1C: 9.7%

## 2012-03-15 NOTE — Progress Notes (Signed)
Unable to wean pt off O2, pt desatted down to 90% on room air, pt currently on one liter.

## 2012-03-15 NOTE — Progress Notes (Signed)
UR completed 

## 2012-03-15 NOTE — Progress Notes (Signed)
[name] AVW:[id number] is a 78 y.o. female,  Outpatient Primary MD for the patient is No primary provider on file.  Chief Complaint  Patient presents with  . Shortness of Breath        Subjective:   [name] today has, No headache, no chest dyscomfort, No abdominal pain -  No new weakness tingling or numbness, No Cough - SOB.    Objective:   Filed Vitals:   03/14/12 1800 03/14/12 2100 03/15/12 0500 03/15/12 0900  BP: 124/78 117/79 112/75 107/73  Pulse: 121 99 97 126  Temp: 98.3 F (36.8 C) 98.2 F (36.8 C) 98.2 F (36.8 C)   TempSrc: Oral Oral Oral   Resp: 18 17 16    Height:      Weight:   67.9 kg (149 lb 11.1 oz)   SpO2: 96% 95% 92%     Wt Readings from Last 3 Encounters:  03/15/12 67.9 kg (149 lb 11.1 oz)     Intake/Output Summary (Last 24 hours) at 03/15/12 1232 Last data filed at 03/15/12 0900  Gross per 24 hour  Intake    840 ml  Output   1300 ml  Net   -460 ml    Exam Awake Alert, Oriented *3, No new F.N deficits, Normal affect [place].AT,PERRAL Supple Neck,No JVD, No cervical lymphadenopathy appriciated.  Symmetrical Chest wall movement, Good air movement bilaterally, CTAB iRRR,No Gallops,Rubs or new Murmurs, No Parasternal Heave +ve B.Sounds, Abd Soft, Non tender, No organomegaly appriciated, No rebound -guarding or rigidity. No Cyanosis, Clubbing or edema, No new Rash or bruise     Data Review  CBC  Lab 03/14/12 0620 03/13/12 1516  WBC 5.6 7.0  HGB 12.1 12.4  HCT 34.3* 34.5*  PLT 147* 147*  MCV 87.5 85.4  MCH 30.9 30.7  MCHC 35.3 35.9  RDW 12.7 12.4  LYMPHSABS -- --  MONOABS -- --  EOSABS -- --  BASOSABS -- --  BANDABS -- --    Chemistries   Lab 03/14/12 0620 03/13/12 1516  NA 137 138  K 3.6 4.1  CL 102 101  CO2 24 25  GLUCOSE 233* 276*  BUN 11 14  CREATININE 0.95 1.00  CALCIUM 8.5 9.2  MG -- --  AST -- --  ALT -- --  ALKPHOS -- --  BILITOT -- --    ------------------------------------------------------------------------------------------------------------------ estimated creatinine clearance is 47.3 ml/min (by C-G formula based on Cr of 0.95). ------------------------------------------------------------------------------------------------------------------  Basename 03/13/12 2200  HGBA1C 9.7*   ------------------------------------------------------------------------------------------------------------------ No results found for this basename: CHOL:2,HDL:2,LDLCALC:2,TRIG:2,CHOLHDL:2,LDLDIRECT:2 in the last 72 hours ------------------------------------------------------------------------------------------------------------------  Basename 03/14/12 1255  TSH 1.158  T4TOTAL --  T3FREE --  THYROIDAB --   ------------------------------------------------------------------------------------------------------------------ No results found for this basename: VITAMINB12:2,FOLATE:2,FERRITIN:2,TIBC:2,IRON:2,RETICCTPCT:2 in the last 72 hours  Coagulation profile  Lab 03/13/12 2200  INR 1.07  PROTIME --    No results found for this basename: DDIMER:2 in the last 72 hours  Cardiac Enzymes  Lab 03/14/12 1155 03/14/12 0620 03/13/12 2200  CKMB 2.0 2.1 2.5  TROPONINI <0.30 <0.30 <0.30  MYOGLOBIN -- -- --   ------------------------------------------------------------------------------------------------------------------ No components found with this basename: POCBNP:3  Micro Results No results found for this or any previous visit (from the past 240 hour(s)).  Radiology Reports Dg Chest 2 View  03/13/2012  *RADIOLOGY REPORT*  Clinical Data: Shortness of breath  CHEST - 2 VIEW  Comparison: Report 02/25/11, no images available and chest x-ray 12/01/2009  Findings: Cardiomediastinal silhouette is stable.  No acute infiltrate or pulmonary edema.  Mild elevation of the right anterior diaphragm.  A nodule in the right base posteriorly  measures 1.9 cm stable in size and appearance from prior exam.  IMPRESSION: No active disease.  No significant change.  Original Report Authenticated By: Natasha Mead, M.D.   Ct Angio Chest W/cm &/or Wo Cm  03/13/2012  *RADIOLOGY REPORT*  Clinical Data: Dyspnea, new onset of atrial fibrillation, history of lung cancer  CT ANGIOGRAPHY CHEST  Technique:  Multidetector CT imaging of the chest using the standard protocol during bolus administration of intravenous contrast. Multiplanar reconstructed images including MIPs were obtained and reviewed to evaluate the vascular anatomy.  Contrast: OMNIPAQUE IOHEXOL 300 MG/ML IJ SOLN  Comparison: CT report from [facility] hospital 02/09/12 no images available  Findings: The study is of excellent technical quality.  No pulmonary embolus is identified.  Atherosclerotic calcifications of thoracic aorta are noted.  There are coronary artery calcifications.  Heart size is within normal limits.  No pericardial effusion is noted.  A precarinal lymph node measures 1.1 x 1.1 cm not pathologic by size criteria.  There is bilateral central peribronchial thickening.  A right hilar lymph node measures 1.1 x 1 cm borderline by size criteria.  Left hilar lymph node measures 1.1 x 1 cm borderline by size criteria.  Images of the lung parenchyma shows no acute infiltrate or pulmonary edema.  A dominant nodule in the right middle lobe measures 1.2 cm.  This is stable in size from prior exam according to the report.  Additional lung nodules are noted in the right middle lobe the largest measures 9 mm.  Nodules are noted bilateral lower lobe.  The largest nodule in the right lower lobe medially measures 1.8 cm.  This was not measured however by chest x-ray appearance this appears stable.  Small nodules in the left lower lobe are noted the largest in axial image 55 measures 8 mm.  No destructive bony lesions are noted.  There is diffuse osteopenia and mild degenerative changes of the thoracic  spine.  The visualized upper abdomen is unremarkable.  IMPRESSION:  1.  No pulmonary embolus is noted. 2.  Borderline bilateral hilar lymph nodes. 3.   Central peribronchial thickening bilaterally.  4.  At least three or four nodules are noted in the right middle lobe the largest measures 1.2 cm.  According to the report this is stable from prior exam.  Scattered nodules are noted in the right lower lobe the largest measures 1.8 cm.  No measurement was provided on Report however by chest x-ray this is stable from prior exams.  Scattered nodules are noted in the left lower lobe the largest measures 8 mm.  Follow-up examination in 6 months is recommended to assure stability if no other follow up was recommended by [facility].  Original Report Authenticated By: Natasha Mead, M.D.   Echo  - Left ventricle: The cavity size was normal. There was mild concentric hypertrophy. Systolic function was normal. The estimated ejection fraction was in the range of 55% to 60%. Wall motion was normal; there were no regional wall motion abnormalities.  - Aortic valve: There was very mild stenosis. Mild regurgitation. Valve area: 0.92cm^2(VTI). Valve area: 1.04cm^2 (Vmax). - Right ventricle: The cavity size was mildly dilated. Systolic pressure was increased. - Atrial septum: No defect or patent foramen ovale was identified. - Impressions: The right ventricular systolic pressure was increased consistent with mild pulmonary hypertension.     Scheduled Meds:    . aspirin EC  81 mg Oral Daily  . calcium-vitamin D  1 tablet Oral Daily  . digoxin  0.125 mg Oral Daily  . diltiazem  30 mg Oral QID  . diltiazem  30 mg Oral Once  . insulin aspart  0-15 Units Subcutaneous TID WC  . insulin aspart  3 Units Subcutaneous TID WC  . isosorbide mononitrate  60 mg Oral Daily  . levothyroxine  125 mcg Oral QAC breakfast  . meclizine  25 mg Oral QID  . metoprolol tartrate  25 mg Oral Q8H  . olmesartan  10 mg Oral  Daily  . rivaroxaban  15 mg Oral Q supper  . sodium chloride  3 mL Intravenous Q12H  . DISCONTD: digoxin  0.125 mg Oral Daily  . DISCONTD: rivaroxaban  20 mg Oral QHS   Continuous Infusions:    . DISCONTD: sodium chloride     PRN Meds:.albuterol, guaiFENesin-dextromethorphan, ondansetron (ZOFRAN) IV, ondansetron, promethazine, DISCONTD: HYDROcodone-acetaminophen  Assessment & Plan    1.Atrial Fib with RVR - in a pt with CAD, HTN, DM-2 with some rate related chest discomfort - ruled out for MI, in RC, on Po Lopressor and Xaralto, stable TSH-Trop, lytes, Echo shows Chr Diast CHF, compensated at this time, Afib Meds per Dr Jacinto Halim who has requested me to Digitize pt for better rate control, he has placed her on Cardizem + Lopressor combo. Will monitor K closely.   2.DM-2 - A1c high, hold glucophage, ISS increased, premeal Insulin added.  Lab Results  Component Value Date   HGBA1C 9.7* 03/13/2012     CBG (last 3)   Basename 03/15/12 1152 03/15/12 0744 03/14/12 2136  GLUCAP 161* 235* 257*      3.Lung CA - outpt [facility] follow along with CT report from here.    4.H/O CVA - no acute issue continue ASA, outpt risk factor modulation.    5.Hypothyroidsm - home synthroid dose, stable TSH.    6.Mild nausea with Narco - added as alergy, resolved with Zofran.    7. 2 sec pause on Tele on 03-14-12 - Ok per Dr Jacinto Halim no issue.     DVT Prophylaxis  Lesia Hausen  -  SCDs      Leroy Sea M.D on 03/15/2012 at 12:32 PM  [facility] Office  [phone]

## 2012-03-15 NOTE — Progress Notes (Signed)
MEDICATION RELATED CONSULT NOTE - INITIAL   Pharmacy Consult for Digoxin Indication: Atrial Fib with RVR   Allergies  Allergen Reactions  . Norco (Hydrocodone-Acetaminophen) Nausea And Vomiting  . Hydrocodone     Patient Measurements: Height: 5\' 3"  (160 cm) Weight: 149 lb 11.1 oz (67.9 kg) IBW/kg (Calculated) : 52.4   Vital Signs: Temp: 98.2 F (36.8 C) (03/18 0500) Temp src: Oral (03/18 0500) BP: 107/73 mmHg (03/18 0900) Pulse Rate: 126  (03/18 0900)   Labs:  Basename 03/14/12 0620 03/13/12 1516  WBC 5.6 7.0  HGB 12.1 12.4  HCT 34.3* 34.5*  PLT 147* 147*  APTT -- --  CREATININE 0.95 1.00  LABCREA -- --  CREATININE 0.95 1.00  CREAT24HRUR -- --  MG -- --  PHOS -- --  ALBUMIN -- --  PROT -- --  ALBUMIN -- --  AST -- --  ALT -- --  ALKPHOS -- --  BILITOT -- --  BILIDIR -- --  IBILI -- --   Estimated Creatinine Clearance: 47.3 ml/min (by C-G formula based on Cr of 0.95).   Medications:  Scheduled:    . aspirin EC  81 mg Oral Daily  . calcium-vitamin D  1 tablet Oral Daily  . digoxin  0.125 mg Oral Daily  . diltiazem  30 mg Oral QID  . diltiazem  30 mg Oral Once  . insulin aspart  0-15 Units Subcutaneous TID WC  . insulin aspart  3 Units Subcutaneous TID WC  . isosorbide mononitrate  60 mg Oral Daily  . levothyroxine  125 mcg Oral QAC breakfast  . meclizine  25 mg Oral QID  . metoprolol tartrate  25 mg Oral Q8H  . olmesartan  10 mg Oral Daily  . rivaroxaban  15 mg Oral Q supper  . sodium chloride  3 mL Intravenous Q12H  . DISCONTD: digoxin  0.125 mg Oral Daily  . DISCONTD: rivaroxaban  20 mg Oral QHS    Assessment:  79 yo F admit with Afib RVR and history of lung cancer, CAD, CVA, DM2, HTN, hypothyroidism, diastolic CHF  Rate control with diltiazem and metoprolol  Xarelto for anticoagulation (dose reduced to 15 mg daily due to renal function)  Renal insufficiency  Digoxin 0.125mg  PO given 3/18 at 1000  Digoxin loading dose requested -  will used reduced first bolus due to advanced age and initial PO dose today.   Plan:   Digoxin 0.25mg  IV x1 dose, then 0.125mg  IV x2 doses, at 6 hour intervals  Digoxin 0.125mg  PO daily as ordered, to start 3/20   Lynann Beaver PharmD, BCPS Pager [phone] 03/15/2012 1:05 PM

## 2012-03-16 LAB — BASIC METABOLIC PANEL
BUN: 21 mg/dL (ref 6–23)
CO2: 27 mEq/L (ref 19–32)
Calcium: 8.7 mg/dL (ref 8.4–10.5)
GFR calc non Af Amer: 50 mL/min — ABNORMAL LOW (ref >90)
Glucose, Bld: 232 mg/dL — ABNORMAL HIGH (ref 70–99)
Potassium: 3.7 mEq/L (ref 3.5–5.1)
Sodium: 136 mEq/L (ref 135–145)

## 2012-03-16 LAB — GLUCOSE, CAPILLARY
Glucose-Capillary: 191 mg/dL — ABNORMAL HIGH (ref 70–99)
Glucose-Capillary: 180 mg/dL — ABNORMAL HIGH (ref 70–99)
Glucose-Capillary: 298 mg/dL — ABNORMAL HIGH (ref 70–99)

## 2012-03-16 MED ORDER — METOPROLOL TARTRATE 25 MG PO TABS
25.0000 mg | ORAL_TABLET | Freq: Four times a day (QID) | ORAL | Status: DC
  Filled 2012-03-16 (×4): qty 1

## 2012-03-16 MED ORDER — DILTIAZEM HCL 60 MG PO TABS: 60.0000 mg | ORAL_TABLET | Freq: Four times a day (QID) | ORAL | Status: DC

## 2012-03-16 MED ORDER — METOPROLOL TARTRATE 25 MG PO TABS
25.0000 mg | ORAL_TABLET | Freq: Four times a day (QID) | ORAL | Status: DC
  Administered 2012-03-16 – 2012-03-17 (×6): 25 mg via ORAL
  Filled 2012-03-16 (×9): qty 1

## 2012-03-16 MED ORDER — FUROSEMIDE 40 MG PO TABS
40.0000 mg | ORAL_TABLET | Freq: Once | ORAL | Status: AC
  Administered 2012-03-16: 40 mg via ORAL
  Filled 2012-03-16 (×2): qty 1

## 2012-03-16 MED ORDER — DIGOXIN 125 MCG PO TABS: 0.1250 mg | ORAL_TABLET | Freq: Every day | ORAL | Status: DC

## 2012-03-16 MED ORDER — RIVAROXABAN 15 MG PO TABS: 15.0000 mg | ORAL_TABLET | Freq: Every day | ORAL | Status: DC

## 2012-03-16 MED ORDER — METOPROLOL TARTRATE 25 MG PO TABS: 50.0000 mg | ORAL_TABLET | Freq: Two times a day (BID) | ORAL | Status: DC

## 2012-03-16 MED ORDER — DILTIAZEM HCL 60 MG PO TABS
60.0000 mg | ORAL_TABLET | Freq: Four times a day (QID) | ORAL | Status: DC
  Administered 2012-03-16 – 2012-03-17 (×6): 60 mg via ORAL
  Filled 2012-03-16 (×9): qty 1

## 2012-03-16 MED ORDER — OLMESARTAN MEDOXOMIL 5 MG PO TABS
5.0000 mg | ORAL_TABLET | Freq: Every day | ORAL | Status: DC
  Administered 2012-03-17: 5 mg via ORAL
  Filled 2012-03-16: qty 1

## 2012-03-16 NOTE — Evaluation (Signed)
Physical Therapy Evaluation Patient Details Name: [name] MRN: [id number] DOB: [birth year] Today's Date: 03/16/2012  Problem List:  Patient Active Problem List  Diagnoses  . Lung cancer  . Stroke  . Atrial fibrillation with RVR  . HTN (hypertension)  . Hypothyroid  . Type II or unspecified type diabetes mellitus without mention of complication, not stated as uncontrolled  . CAD (coronary artery disease)    Past Medical History:  Past Medical History  Diagnosis Date  . Lung cancer   . Stroke   . Diabetes mellitus   . CAD (coronary artery disease) 03/13/2012  . Type II or unspecified type diabetes mellitus without mention of complication, not stated as uncontrolled 03/13/2012  . Hypothyroid 03/13/2012  . HTN (hypertension) 03/13/2012   Past Surgical History: History reviewed. No pertinent past surgical history.  PT Assessment/Plan/Recommendation PT Assessment Clinical Impression Statement: Pt admitted for afib with RVR.  Pt would benefit from acute PT services in order to improve independence with ambulation and stairs as well as increase activity tolerance for safe d/c home with spouse.  Pt desaturated on room air upon using bathroom so 2L O2 used during ambulation and HR elevated into 130s.  Pt declines HHPT at this time as she feels she will not need it upon return home. PT Recommendation/Assessment: Patient will need skilled PT in the acute care venue PT Problem List: Decreased activity tolerance;Decreased mobility;Cardiopulmonary status limiting activity;Decreased knowledge of use of DME PT Therapy Diagnosis : Difficulty walking PT Plan PT Frequency: Min 3X/week PT Treatment/Interventions: DME instruction;Gait training;Stair training;Functional mobility training;Therapeutic activities;Therapeutic exercise;Patient/family education PT Recommendation Follow Up Recommendations: No PT follow up Equipment Recommended: None recommended by PT PT Goals  Acute Rehab PT  Goals PT Goal Formulation: With patient Time For Goal Achievement: 7 days Pt will go Sit to Stand: with modified independence PT Goal: Sit to Stand - Progress: Goal set today Pt will go Stand to Sit: with modified independence PT Goal: Stand to Sit - Progress: Goal set today Pt will Ambulate: >150 feet;with modified independence;with least restrictive assistive device;Other (comment) (SaO2 >92% room air) PT Goal: Ambulate - Progress: Goal set today Pt will Go Up / Down Stairs: 6-9 stairs;with rail(s);with supervision PT Goal: Up/Down Stairs - Progress: Goal set today Pt will Perform Home Exercise Program: Independently PT Goal: Perform Home Exercise Program - Progress: Goal set today  PT Evaluation Precautions/Restrictions    Prior Functioning  Home Living Lives With: Spouse Type of Home: House Home Layout: One level Home Access: Stairs to enter Entrance Stairs-Rails: Can reach both Entrance Stairs-Number of Steps: 6 Home Adaptive Equipment: Straight cane Prior Function Level of Independence: Independent with basic ADLs;Independent with gait Cognition Cognition Arousal/Alertness: Awake/alert Overall Cognitive Status: Appears within functional limits for tasks assessed Sensation/Coordination   Extremity Assessment RLE Assessment RLE Assessment: Within Functional Limits LLE Assessment LLE Assessment: Within Functional Limits Mobility (including Balance) Bed Mobility Bed Mobility: Yes Supine to Sit: 6: Modified independent (Device/Increase time) Transfers Transfers: Yes Sit to Stand: 5: Supervision;With upper extremity assist;From bed;From toilet Sit to Stand Details (indicate cue type and reason): verbal cues for hand placement Stand to Sit: 5: Supervision;To toilet;To chair/3-in-1 Stand to Sit Details: verbal cues for hand placement Ambulation/Gait Ambulation/Gait: Yes Ambulation/Gait Assistance: 4: Min assist Ambulation/Gait Assistance Details (indicate cue type and  reason): min/guard, pt ambulated to bathroom on room air and SaO2 81%, so placed on 2L for ambulation with SaO2 98% and HR elevated in 130s, pt reaching out with UEs  upon ambulation to bathroom so used RW in hallway then no assistive device for last 25 feet 2* pt does not want to become dependent on RW but reported occasionally with use cane. Ambulation Distance (Feet): 220 Feet Assistive device: Rolling walker Gait Pattern: Step-through pattern;Decreased stride length;Trunk flexed    Exercise    End of Session PT - End of Session Equipment Utilized During Treatment: Gait belt;Other (comment) (oxygen) Activity Tolerance: Patient tolerated treatment well Patient left: in chair;with call bell in reach General Behavior During Session: [facility] for tasks performed Cognition: [facility] for tasks performed  Kirby Cortese,KATHrine E 03/16/2012, 12:47 PM Pager: [phone]

## 2012-03-16 NOTE — Progress Notes (Signed)
Pt weaned off O2 and room air sats were 96%. Will continue to monitor. Julio Sicks RN

## 2012-03-16 NOTE — Progress Notes (Addendum)
[name] OZD:[id number]:[id number] is a 78 y.o. female,  Outpatient Primary MD for the patient is No primary provider on file.  Chief Complaint  Patient presents with  . Shortness of Breath        Subjective:   [name] today has, No headache, no chest dyscomfort, No abdominal pain -  No new weakness tingling or numbness, No Cough - mild SOB.    Objective:   Filed Vitals:   03/15/12 2147 03/16/12 0156 03/16/12 0535 03/16/12 0638  BP: 104/64 114/80 94/54 106/79  Pulse: 111  105 96  Temp: 98 F (36.7 C)  97.4 F (36.3 C)   TempSrc: Oral  Oral   Resp: 18  18   Height:      Weight:   67.223 kg (148 lb 3.2 oz)   SpO2: 93%  95%     Wt Readings from Last 3 Encounters:  03/16/12 67.223 kg (148 lb 3.2 oz)     Intake/Output Summary (Last 24 hours) at 03/16/12 1347 Last data filed at 03/16/12 0900  Gross per 24 hour  Intake    480 ml  Output   1425 ml  Net   -945 ml    Exam Awake Alert, Oriented *3, No new F.N deficits, Normal affect [place].AT,PERRAL Supple Neck,No JVD, No cervical lymphadenopathy appriciated.  Symmetrical Chest wall movement, Good air movement bilaterally, few rales iRRR,No Gallops,Rubs or new Murmurs, No Parasternal Heave +ve B.Sounds, Abd Soft, Non tender, No organomegaly appriciated, No rebound -guarding or rigidity. No Cyanosis, Clubbing or edema, No new Rash or bruise     Data Review  CBC  Lab 03/14/12 0620 03/13/12 1516  WBC 5.6 7.0  HGB 12.1 12.4  HCT 34.3* 34.5*  PLT 147* 147*  MCV 87.5 85.4  MCH 30.9 30.7  MCHC 35.3 35.9  RDW 12.7 12.4  LYMPHSABS -- --  MONOABS -- --  EOSABS -- --  BASOSABS -- --  BANDABS -- --    Chemistries   Lab 03/16/12 0444 03/14/12 0620 03/13/12 1516  NA 136 137 138  K 3.7 3.6 4.1  CL 100 102 101  CO2 27 24 25   GLUCOSE 232* 233* 276*  BUN 21 11 14   CREATININE 1.06 0.95 1.00  CALCIUM 8.7 8.5 9.2  MG 1.3* -- --  AST -- -- --  ALT -- -- --  ALKPHOS -- -- --  BILITOT -- -- --    ------------------------------------------------------------------------------------------------------------------ estimated creatinine clearance is 42.2 ml/min (by C-G formula based on Cr of 1.06). ------------------------------------------------------------------------------------------------------------------  Basename 03/13/12 2200  HGBA1C 9.7*   ------------------------------------------------------------------------------------------------------------------ No results found for this basename: CHOL:2,HDL:2,LDLCALC:2,TRIG:2,CHOLHDL:2,LDLDIRECT:2 in the last 72 hours ------------------------------------------------------------------------------------------------------------------  Basename 03/14/12 1255  TSH 1.158  T4TOTAL --  T3FREE --  THYROIDAB --   ------------------------------------------------------------------------------------------------------------------ No results found for this basename: VITAMINB12:2,FOLATE:2,FERRITIN:2,TIBC:2,IRON:2,RETICCTPCT:2 in the last 72 hours  Coagulation profile  Lab 03/13/12 2200  INR 1.07  PROTIME --    No results found for this basename: DDIMER:2 in the last 72 hours  Cardiac Enzymes  Lab 03/14/12 1155 03/14/12 0620 03/13/12 2200  CKMB 2.0 2.1 2.5  TROPONINI <0.30 <0.30 <0.30  MYOGLOBIN -- -- --   ------------------------------------------------------------------------------------------------------------------ No components found with this basename: POCBNP:3  Micro Results No results found for this or any previous visit (from the past 240 hour(s)).  Radiology Reports Dg Chest 2 View  03/13/2012  *RADIOLOGY REPORT*  Clinical Data: Shortness of breath  CHEST - 2 VIEW  Comparison: Report 02/25/11, no images available and chest x-ray  12/01/2009  Findings: Cardiomediastinal silhouette is stable.  No acute infiltrate or pulmonary edema.  Mild elevation of the right anterior diaphragm.  A nodule in the right base posteriorly  measures 1.9 cm stable in size and appearance from prior exam.  IMPRESSION: No active disease.  No significant change.  Original Report Authenticated By: Natasha Mead, M.D.   Ct Angio Chest W/cm &/or Wo Cm  03/13/2012  *RADIOLOGY REPORT*  Clinical Data: Dyspnea, new onset of atrial fibrillation, history of lung cancer  CT ANGIOGRAPHY CHEST  Technique:  Multidetector CT imaging of the chest using the standard protocol during bolus administration of intravenous contrast. Multiplanar reconstructed images including MIPs were obtained and reviewed to evaluate the vascular anatomy.  Contrast: OMNIPAQUE IOHEXOL 300 MG/ML IJ SOLN  Comparison: CT report from [facility] hospital 02/09/12 no images available  Findings: The study is of excellent technical quality.  No pulmonary embolus is identified.  Atherosclerotic calcifications of thoracic aorta are noted.  There are coronary artery calcifications.  Heart size is within normal limits.  No pericardial effusion is noted.  A precarinal lymph node measures 1.1 x 1.1 cm not pathologic by size criteria.  There is bilateral central peribronchial thickening.  A right hilar lymph node measures 1.1 x 1 cm borderline by size criteria.  Left hilar lymph node measures 1.1 x 1 cm borderline by size criteria.  Images of the lung parenchyma shows no acute infiltrate or pulmonary edema.  A dominant nodule in the right middle lobe measures 1.2 cm.  This is stable in size from prior exam according to the report.  Additional lung nodules are noted in the right middle lobe the largest measures 9 mm.  Nodules are noted bilateral lower lobe.  The largest nodule in the right lower lobe medially measures 1.8 cm.  This was not measured however by chest x-ray appearance this appears stable.  Small nodules in the left lower lobe are noted the largest in axial image 55 measures 8 mm.  No destructive bony lesions are noted.  There is diffuse osteopenia and mild degenerative changes of the thoracic  spine.  The visualized upper abdomen is unremarkable.  IMPRESSION:  1.  No pulmonary embolus is noted. 2.  Borderline bilateral hilar lymph nodes. 3.   Central peribronchial thickening bilaterally.  4.  At least three or four nodules are noted in the right middle lobe the largest measures 1.2 cm.  According to the report this is stable from prior exam.  Scattered nodules are noted in the right lower lobe the largest measures 1.8 cm.  No measurement was provided on Report however by chest x-ray this is stable from prior exams.  Scattered nodules are noted in the left lower lobe the largest measures 8 mm.  Follow-up examination in 6 months is recommended to assure stability if no other follow up was recommended by [facility]  Original Report Authenticated By: Natasha Mead, M.D.   Echo  - Left ventricle: The cavity size was normal. There was mild concentric hypertrophy. Systolic function was normal. The estimated ejection fraction was in the range of 55% to 60%. Wall motion was normal; there were no regional wall motion abnormalities.  - Aortic valve: There was very mild stenosis. Mild regurgitation. Valve area: 0.92cm^2(VTI). Valve area: 1.04cm^2 (Vmax). - Right ventricle: The cavity size was mildly dilated. Systolic pressure was increased. - Atrial septum: No defect or patent foramen ovale was identified. - Impressions: The right ventricular systolic pressure was increased consistent with  mild pulmonary hypertension.     Scheduled Meds:    . aspirin EC  81 mg Oral Daily  . calcium-vitamin D  1 tablet Oral Daily  . digoxin  0.125 mg Intravenous Q6H  . digoxin  0.25 mg Intravenous Once  . digoxin  0.125 mg Oral Daily  . diltiazem  60 mg Oral Q6H  . furosemide  40 mg Oral Once  . insulin aspart  0-15 Units Subcutaneous TID WC  . insulin aspart  3 Units Subcutaneous TID WC  . isosorbide mononitrate  60 mg Oral Daily  . levothyroxine  125 mcg Oral QAC breakfast  . meclizine  25 mg  Oral QID  . metoprolol tartrate  25 mg Oral Q6H  . olmesartan  10 mg Oral Daily  . rivaroxaban  15 mg Oral Q supper  . sodium chloride  3 mL Intravenous Q12H  . DISCONTD: digoxin  0.125 mg Intravenous Q6H  . DISCONTD: diltiazem  30 mg Oral QID  . DISCONTD: metoprolol tartrate  25 mg Oral Q8H  . DISCONTD: metoprolol tartrate  25 mg Oral QID   Continuous Infusions:   PRN Meds:.guaiFENesin-dextromethorphan, ondansetron (ZOFRAN) IV, ondansetron, promethazine, DISCONTD: albuterol  Assessment & Plan   This is a pleasant 78 year old Caucasian female with history of lung cancer which has been stable for the last 9 years without any treatment and she follows at [facility] for this problem. She was admitted to days ago with palpitations and shortness of breath and was found to be having atrial fibrillation with RVR and now has evidence of acute on chronic diastolic CHF. Patient has been seen and followed here by her primary cardiologist Dr.Ganji, was titrating all her cardiac medications including Lopressor Cardizem digoxin, today at the time of my evaluation her heart rate is still in 100s i.e. 112 in the time I saw her, I discussed her case with Dr. Jacinto Halim. at 1:50 PM and the plan is if her heart rate does not get controlled she might get cardioverted tomorrow.     Pt to be transferred to Dr Verl Dicker [facility] in am.    1.Atrial Fib with RVR - in a pt with CAD, HTN, DM-2 with some rate related chest discomfort - ruled out for MI, in RC, on Po Lopressor and Xaralto, stable TSH-Trop, lytes, Echo shows Diast CHF, compensated at this time, Afib Meds per Dr Jacinto Halim who has requested me to Digitize pt for better rate control, he has placed her on Cardizem + Lopressor combo.1 dose of Lasix 40 mg for her shortness of breath and oxygen dependence , have requested the nurse to monitor her oxygen demand and try to titrate her off if she can tolerate being on room air .   2.DM-2 - A1c high, hold glucophage, ISS  increased, premeal Insulin added.  Lab Results  Component Value Date   HGBA1C 9.7* 03/13/2012     CBG (last 3)   Basename 03/16/12 1131 03/16/12 0815 03/15/12 2145  GLUCAP 180* 191* 267*      3.Lung CA - outpt [facility] follow along with CT report from here. (DC Instructions written as below day before DC)   4.H/O CVA - no acute issue continue ASA, outpt risk factor modulation.    5.Hypothyroidsm - home synthroid dose, stable TSH.    6.Mild nausea with Narco - added as alergy, resolved with Zofran.    7. 2 sec pause on Tele on 03-14-12 - Ok per Dr Jacinto Halim no issue.    8. Mild acute  on chronic diastolic CHF one time Lasix dose now, with better rate control diastolic CHF should improve.      DVT Prophylaxis  Lesia Hausen  -  SCDs      Leroy Sea M.D on 03/16/2012 at 1:47 PM  [facility] Office  [phone]  Following DC Instructions were written 03-16-12 8pm and I called and informed Dr Jacinto Halim  Follow with Primary MD in 7 days .  Get CBC, CMP, checked 7 days by Primary MD and again as instructed by your Primary MD. Get a 2 view Chest X ray done next visit .  Get Medicines reviewed and adjusted.  Please request your Prim.MD to go over all Hospital Tests and Procedure/Radiological results at the follow up, please get all Hospital records sent to your Prim MD by signing hospital release before you go home.  Show yout CT report from this Hospitalization to your Lung Doctor at [facility] in 7-10 days.   Activity: Fall precautions use walker/cane & assistance as needed  Diet: Heart Healthy,  Fluid restriction 1.8 lit/day, Aspiration precautions.  For Heart failure patients - Check your Weight same time everyday, if you gain over 2 pounds, or you develop in leg swelling, experience more shortness of breath or chest pain, call your Primary MD immediately. Follow Cardiac Low Salt Diet and 1.8 lit/day fluid restriction.  Disposition Home  If you experience  worsening of your admission symptoms, develop shortness of breath, life threatening emergency, suicidal or homicidal thoughts you must seek medical attention immediately by calling 911 or calling your MD immediately  if symptoms less severe.  You Must read complete instructions/literature along with all the possible adverse reactions/side effects for all the Medicines you take and that have been prescribed to you. Take any new Medicines after you have completely understood and accpet all the possible adverse reactions/side effects.   Do not drive if your were admitted for syncope or siezures until you have seen by Primary MD or a Neurologist and advised to drive.  Do not drive when taking Pain medications.    Do not take more than prescribed Pain, Sleep and Anxiety Medications  Special Instructions: If you have smoked or chewed Tobacco  in the last 2 yrs please stop smoking, stop any regular Alcohol  and or any Recreational drug use.

## 2012-03-16 NOTE — Discharge Instructions (Signed)
Follow with Primary MD in 7 days .  Get CBC, CMP, checked 7 days by Primary MD and again as instructed by your Primary MD. Get a 2 view Chest X ray done next visit .  Get Medicines reviewed and adjusted.  Please request your Prim.MD to go over all Hospital Tests and Procedure/Radiological results at the follow up, please get all Hospital records sent to your Prim MD by signing hospital release before you go home.  Show yout CT report from this Hospitalization to your Lung Doctor at [facility] in 7-10 days.   Activity: Fall precautions use walker/cane & assistance as needed  Diet: Heart Healthy,  Fluid restriction 1.8 lit/day, Aspiration precautions.  For Heart failure patients - Check your Weight same time everyday, if you gain over 2 pounds, or you develop in leg swelling, experience more shortness of breath or chest pain, call your Primary MD immediately. Follow Cardiac Low Salt Diet and 1.8 lit/day fluid restriction.  Disposition Home  If you experience worsening of your admission symptoms, develop shortness of breath, life threatening emergency, suicidal or homicidal thoughts you must seek medical attention immediately by calling 911 or calling your MD immediately  if symptoms less severe.  You Must read complete instructions/literature along with all the possible adverse reactions/side effects for all the Medicines you take and that have been prescribed to you. Take any new Medicines after you have completely understood and accpet all the possible adverse reactions/side effects.   Do not drive if your were admitted for syncope or siezures until you have seen by Primary MD or a Neurologist and advised to drive.  Do not drive when taking Pain medications.    Do not take more than prescribed Pain, Sleep and Anxiety Medications  Special Instructions: If you have smoked or chewed Tobacco  in the last 2 yrs please stop smoking, stop any regular Alcohol  and or any Recreational drug  use.  Wear Seat belts while driving.Atrial Fibrillation Atrial fibrillation is an abnormal heartbeat (rhythm). It can cause your heart rate to be faster or slower than normal, and can cause clots of blood to form in your heart. These clots can cause other health problems. Atrial fibrillation may be caused by a heart attack, lung problem, or certain medicine. Sometimes the cause of atrial fibrillation is not found. HOME CARE  Take blood thinning medicine (anticoagulants) as told by your doctor. Your doctor will need to draw your blood to check lab values if you take blood thinners.   If you had a cardioversion, limit your activity as told by your doctor.   Learn how to check your heartbeat (pulse) for an abnormal or irregular beat. Your doctor can show you how.   Ask your doctor if it is okay to exercise.   Only take medicine as told by your doctor.  GET HELP RIGHT AWAY IF:   You have trouble breathing or feel dizzy.   You have puffy (swollen) feet or ankles.   You have blood in your pee (urine) or poop (bowel movement).   You feel your heart "skipping" beats.   You feel your heart "racing" or beating fast.   You have weakness in your arms or legs.   You have trouble talking, seeing, or thinking.   You have chest pain or pain in your arm or jaw.  MAKE SURE YOU:   Understand these instructions.   Will watch your condition.   Will get help right away if you are not doing  well or get worse.  Document Released: 09/23/2008 Document Revised: 12/04/2011 Document Reviewed: 04/04/2010 [facility] Patient Information 2012 [place], [place].

## 2012-03-16 NOTE — Progress Notes (Signed)
Subjective:   Feels much better. Slept well. No chest pain or dyspnea. HR in 90 yesterday, but in 120 - 130 this morning.   Objective:  Vital Signs in the last 24 hours: Temp:  [97.4 F (36.3 C)-98.2 F (36.8 C)] 97.4 F (36.3 C) (03/19 0535) Pulse Rate:  [96-126] 96  (03/19 0638) Resp:  [18] 18  (03/19 0535) BP: (94-132)/(54-83) 106/79 mmHg (03/19 0638) SpO2:  [90 %-95 %] 95 % (03/19 0535) Weight:  [67.223 kg (148 lb 3.2 oz)] 67.223 kg (148 lb 3.2 oz) (03/19 0535)  Intake/Output from previous day: 03/18 0701 - 03/19 0700 In: 1080 [P.O.:1080] Out: 1725 [Urine:1725]  Physical Exam:   General appearance: alert, cooperative, appears stated age and no distress Eyes: conjunctivae/corneas clear. PERRL, EOM's intact. Fundi benign. Neck: no adenopathy, no carotid bruit, no JVD, supple, symmetrical, trachea midline and thyroid not enlarged, symmetric, no tenderness/mass/nodules Neck: JVP - normal, carotids 2+= without bruits Resp: wheezes bibasilar and bilaterally Chest wall: no tenderness, left sided chest wall tenderness Cardio: S1 variable, S2 normal. no murmur. Tachycardia present GI: soft, non-tender; bowel sounds normal; no masses,  no organomegaly Extremities: extremities normal, atraumatic, no cyanosis or edema    Lab Results:  Basename 03/14/12 0620 03/13/12 1516  WBC 5.6 7.0  HGB 12.1 12.4  PLT 147* 147*    Basename 03/16/12 0444 03/14/12 0620  NA 136 137  K 3.7 3.6  CL 100 102  CO2 27 24  GLUCOSE 232* 233*  BUN 21 11  CREATININE 1.06 0.95    Basename 03/14/12 1155 03/14/12 0620  TROPONINI <0.30 <0.30      Cardiac Studies: BNP elevated suggests acute on chronic diastolic heart failure.   Assessment/Plan:   1. Atrial fibrillation with rapid response, probable onset 2 weeks ago with development of marked dyspnea on exertion and orthopnea. Dyspnea improved and states she slept well for the first time in 2 weeks.  2. Hypertension controlled  3.  Hyperlipidemia  4. Diabetes mellitus type 2 uncontrolled, HbA1c 9.7  5. History of rare wall of lung cancer being followed at [facility]. CT scan done yesterday is stable compared to prior CT scan done in February of 2013. No pulmonary embolism.  6. Shortness of breath and dyspnea on exertion. I suspect this is due to an underlying lung pathology and also due to atrial fibrillation with rapid response.  7. Chest pain which appears to be muscles musculoskeletal and atypical. Easily reproducible.  8. Acute on chronic diastolic heart failure. Clinically better today. Exacerbated by A. fib  Rec: I will change the cardizem to 60 mg TID today and increase Metoprolol to 25 mg QID. And if heart rate better this afternoone, can be discharged with OP evaluation with me. I will consider cardioversion in 4 weeks and consider OP stress testing.    Pamella Pert, M.D. 03/16/2012, 7:24 AM

## 2012-03-16 NOTE — Discharge Summary (Signed)
Physician Discharge Summary  Patient ID: [name] MRN: [id number] DOB/AGE: 1934-06-13 78 y.o.  Admit date: 03/13/2012 Discharge date: 03/16/2012  Primary Discharge Diagnosis 1. Atrial fibrillation with rapid response, probable onset 2 weeks ago with development of marked dyspnea on exertion and orthopnea.  Secondary Discharge Diagnosis  1. Atrial fibrillation with rapid response, probable onset 2 weeks ago with development of marked dyspnea on exertion and orthopnea.  2. Hypertension controlled  3. Hyperlipidemia  4. Diabetes mellitus type 2 uncontrolled, HbA1c 9.7  5. History of rare wall of lung cancer being followed at [facility]. CT scan done yesterday is stable compared to prior CT scan done in February of 2013. No pulmonary embolism.  6. Shortness of breath and dyspnea on exertion. I suspect this is due to an underlying lung pathology and also due to atrial fibrillation with rapid response.  7. Chest pain which appears to be muscles musculoskeletal and atypical. Easily reproducible.  8. Acute on chronic diastolic heart failure. Clinically better today. Exacerbated by A. fib   Significant Diagnostic Studies:  Consults: Cardiology: Dr. Jacinto Halim,   Hospital Course:  Patient presented to the emergency department on 03/13/2012 complaining of shortness of breath which started 2 weeks ago and had gotten worse to a point where she could not lay down and was found to be in atrial fibrillation with rapid response. Heart rate was between 110-150 beats per minute. Patient was started on IV Cardizem and also a low dose of beta blocker. Patient had a very brief 2.1 second pulse but otherwise no other significant arrhythmias. Her length of stay was extended due to the inability to control her heart rate better in spite of using relatively high doses of the Toprol and Cardizem and hence patient had to be digitized with digoxin. Patient was also in very mild congestive heart failure do to  atrial fibrillation with rapid response and she responded with when necessary Lasix. Patient's blood sugar was elevated and hemoglobin A1c is elevated suggesting uncontrolled diabetes and this needs to be addressed. Patient was also started on anticoagulation with Xarelto and plan to perform direct current cardioversion in 4 weeks after she had been well anticoagulated. She also has a form of lung cancer and CT scan of the chest did not reveal any pulmonary embolism and there was no significant change in pulmonary pathology. I examined the patient on 03/15/2012 at 1800 hours and felt that she could be discharged home in the morning after overnight observation if her heart rate was controlled and she had no problems overnight. I will see her in the office in 10 days.    Discharge Exam: Blood pressure 93/65, pulse 117, temperature 97.7 F (36.5 C), temperature source Oral, resp. rate 18, height 5\' 3"  (1.6 m), weight 67.223 kg (148 lb 3.2 oz), SpO2 96.00%.   General appearance: alert, appears stated age and no distress Resp: wheezes bibasilar and bilaterally and  Cardio: S1 is variable, S2 is normal. There is no gallop or murmur appreciated. GI: soft, non-tender; bowel sounds normal; no masses,  no organomegaly Extremities: extremities normal, atraumatic, no cyanosis or edema Skin: Skin color, texture, turgor normal. No rashes or lesions Labs:   Lab Results  Component Value Date   WBC 5.6 03/14/2012   HGB 12.1 03/14/2012   HCT 34.3* 03/14/2012   MCV 87.5 03/14/2012   PLT 147* 03/14/2012    Lab 03/16/12 0444  NA 136  K 3.7  CL 100  CO2 27  BUN 21  CREATININE 1.06  CALCIUM 8.7  PROT --  BILITOT --  ALKPHOS --  ALT --  AST --  GLUCOSE 232*   Lab Results  Component Value Date   CKTOTAL 66 03/14/2012   CKMB 2.0 03/14/2012   TROPONINI <0.30 03/14/2012      Radiology: Dg Chest 2 View  03/13/2012  *RADIOLOGY REPORT*  Clinical Data: Shortness of breath  CHEST - 2 VIEW  Comparison:  Report 02/25/11, no images available and chest x-ray 12/01/2009  Findings: Cardiomediastinal silhouette is stable.  No acute infiltrate or pulmonary edema.  Mild elevation of the right anterior diaphragm.  A nodule in the right base posteriorly measures 1.9 cm stable in size and appearance from prior exam.  IMPRESSION: No active disease.  No significant change.  Original Report Authenticated By: Natasha Mead, M.D.   Ct Angio Chest W/cm &/or Wo Cm  03/13/2012  *RADIOLOGY REPORT*  Clinical Data: Dyspnea, new onset of atrial fibrillation, history of lung cancer  CT ANGIOGRAPHY CHEST  Technique:  Multidetector CT imaging of the chest using the standard protocol during bolus administration of intravenous contrast. Multiplanar reconstructed images including MIPs were obtained and reviewed to evaluate the vascular anatomy.  Contrast: OMNIPAQUE IOHEXOL 300 MG/ML IJ SOLN  Comparison: CT report from [facility] hospital 02/09/12 no images available  Findings: The study is of excellent technical quality.  No pulmonary embolus is identified.  Atherosclerotic calcifications of thoracic aorta are noted.  There are coronary artery calcifications.  Heart size is within normal limits.  No pericardial effusion is noted.  A precarinal lymph node measures 1.1 x 1.1 cm not pathologic by size criteria.  There is bilateral central peribronchial thickening.  A right hilar lymph node measures 1.1 x 1 cm borderline by size criteria.  Left hilar lymph node measures 1.1 x 1 cm borderline by size criteria.  Images of the lung parenchyma shows no acute infiltrate or pulmonary edema.  A dominant nodule in the right middle lobe measures 1.2 cm.  This is stable in size from prior exam according to the report.  Additional lung nodules are noted in the right middle lobe the largest measures 9 mm.  Nodules are noted bilateral lower lobe.  The largest nodule in the right lower lobe medially measures 1.8 cm.  This was not measured however by chest x-ray  appearance this appears stable.  Small nodules in the left lower lobe are noted the largest in axial image 55 measures 8 mm.  No destructive bony lesions are noted.  There is diffuse osteopenia and mild degenerative changes of the thoracic spine.  The visualized upper abdomen is unremarkable.  IMPRESSION:  1.  No pulmonary embolus is noted. 2.  Borderline bilateral hilar lymph nodes. 3.   Central peribronchial thickening bilaterally.  4.  At least three or four nodules are noted in the right middle lobe the largest measures 1.2 cm.  According to the report this is stable from prior exam.  Scattered nodules are noted in the right lower lobe the largest measures 1.8 cm.  No measurement was provided on Report however by chest x-ray this is stable from prior exams.  Scattered nodules are noted in the left lower lobe the largest measures 8 mm.  Follow-up examination in 6 months is recommended to assure stability if no other follow up was recommended by [facility].  Original Report Authenticated By: Natasha Mead, M.D.    EKG:A. Fibrillation with RVR. Non specific ST changes.  FOLLOW UP PLANS  AND APPOINTMENTS  Medication List  As of 03/16/2012  6:39 PM   STOP taking these medications         aspirin EC 81 MG tablet      isosorbide mononitrate 60 MG 24 hr tablet      metoprolol succinate 25 MG 24 hr tablet      potassium chloride SA 20 MEQ tablet         TAKE these medications         calcium-vitamin D 500-200 MG-UNIT per tablet   Commonly known as: OSCAL WITH D   Take 1 tablet by mouth daily.      digoxin 0.125 MG tablet   Commonly known as: LANOXIN   Take 1 tablet (0.125 mg total) by mouth daily.      diltiazem 60 MG tablet   Commonly known as: CARDIZEM   Take 1 tablet (60 mg total) by mouth every 6 (six) hours.      levothyroxine 125 MCG tablet   Commonly known as: SYNTHROID, LEVOTHROID   Take 125 mcg by mouth daily.      meclizine 25 MG tablet   Commonly known as: ANTIVERT    Take 25 mg by mouth 4 (four) times daily.      metFORMIN 500 MG tablet   Commonly known as: GLUCOPHAGE   Take 500 mg by mouth 2 (two) times daily with a meal.      metoprolol tartrate 25 MG tablet   Commonly known as: LOPRESSOR   Take 2 tablets (50 mg total) by mouth 2 (two) times daily.      Rivaroxaban 15 MG Tabs tablet   Commonly known as: XARELTO   Take 1 tablet (15 mg total) by mouth daily with supper.      valsartan 320 MG tablet   Commonly known as: DIOVAN   Take 320 mg by mouth daily.           Follow-up Information    Follow up with Michiel Sites, MD. Call in 1 day.   Contact information:   [address] [place] [address] [phone]       Follow up with Pamella Pert, MD in 2 weeks.   Contact information:   [address]  [place] [address] [phone]           Pamella Pert, MD 03/16/2012, 6:39 PM

## 2012-03-17 LAB — BASIC METABOLIC PANEL
Calcium: 9 mg/dL (ref 8.4–10.5)
Creatinine, Ser: 1 mg/dL (ref 0.50–1.10)
GFR calc Af Amer: 62 mL/min — ABNORMAL LOW (ref >90)
GFR calc non Af Amer: 54 mL/min — ABNORMAL LOW (ref >90)
Sodium: 138 mEq/L (ref 135–145)

## 2012-03-17 LAB — GLUCOSE, CAPILLARY
Glucose-Capillary: 215 mg/dL — ABNORMAL HIGH (ref 70–99)
Glucose-Capillary: 204 mg/dL — ABNORMAL HIGH (ref 70–99)

## 2012-04-05 ENCOUNTER — Encounter (HOSPITAL_COMMUNITY): Payer: Self-pay | Admitting: Pharmacy Technician

## 2012-04-20 ENCOUNTER — Other Ambulatory Visit: Payer: Self-pay

## 2012-04-20 ENCOUNTER — Ambulatory Visit (HOSPITAL_COMMUNITY): Payer: Medicare Other | Admitting: Anesthesiology

## 2012-04-20 ENCOUNTER — Ambulatory Visit (HOSPITAL_COMMUNITY)
Admission: RE | Admit: 2012-04-20 | Discharge: 2012-04-20 | Disposition: A | Discharge status: Home / Self Care | Payer: Medicare Other | Source: Ambulatory Visit | Attending: Cardiology | Admitting: Cardiology

## 2012-04-20 ENCOUNTER — Encounter (HOSPITAL_COMMUNITY): Payer: Self-pay | Admitting: Anesthesiology

## 2012-04-20 ENCOUNTER — Encounter (HOSPITAL_COMMUNITY)
Admission: RE | Disposition: A | Discharge status: Home / Self Care | Payer: Self-pay | Source: Ambulatory Visit | Attending: Cardiology

## 2012-04-20 DIAGNOSIS — E039 Hypothyroidism, unspecified: Secondary | ICD-10-CM | POA: Insufficient documentation

## 2012-04-20 DIAGNOSIS — I2510 Atherosclerotic heart disease of native coronary artery without angina pectoris: Secondary | ICD-10-CM | POA: Insufficient documentation

## 2012-04-20 DIAGNOSIS — K219 Gastro-esophageal reflux disease without esophagitis: Secondary | ICD-10-CM | POA: Insufficient documentation

## 2012-04-20 DIAGNOSIS — J449 Chronic obstructive pulmonary disease, unspecified: Secondary | ICD-10-CM | POA: Insufficient documentation

## 2012-04-20 DIAGNOSIS — I10 Essential (primary) hypertension: Secondary | ICD-10-CM | POA: Insufficient documentation

## 2012-04-20 DIAGNOSIS — Z8673 Personal history of transient ischemic attack (TIA), and cerebral infarction without residual deficits: Secondary | ICD-10-CM | POA: Insufficient documentation

## 2012-04-20 DIAGNOSIS — E119 Type 2 diabetes mellitus without complications: Secondary | ICD-10-CM | POA: Insufficient documentation

## 2012-04-20 DIAGNOSIS — J4489 Other specified chronic obstructive pulmonary disease: Secondary | ICD-10-CM | POA: Insufficient documentation

## 2012-04-20 DIAGNOSIS — I4891 Unspecified atrial fibrillation: Secondary | ICD-10-CM | POA: Insufficient documentation

## 2012-04-20 HISTORY — PX: CARDIOVERSION: SHX1299

## 2012-04-20 LAB — CBC
Platelets: 106 10*3/uL — ABNORMAL LOW (ref 150–400)
RBC: 4.15 MIL/uL (ref 3.87–5.11)
RDW: 13.4 % (ref 11.5–15.5)
WBC: 8.8 10*3/uL (ref 4.0–10.5)

## 2012-04-20 LAB — BASIC METABOLIC PANEL
CO2: 25 mEq/L (ref 19–32)
Chloride: 98 mEq/L (ref 96–112)
GFR calc Af Amer: 76 mL/min — ABNORMAL LOW (ref >90)
Sodium: 137 mEq/L (ref 135–145)

## 2012-04-20 SURGERY — CARDIOVERSION
Anesthesia: Monitor Anesthesia Care | Wound class: Clean

## 2012-04-20 MED ORDER — HYDROCORTISONE 1 % EX CREA
1.0000 "application " | TOPICAL_CREAM | Freq: Three times a day (TID) | CUTANEOUS | Status: DC | PRN
  Filled 2012-04-20: qty 28

## 2012-04-20 MED ORDER — METOPROLOL TARTRATE 1 MG/ML IV SOLN
INTRAVENOUS | Status: AC
  Filled 2012-04-20: qty 5

## 2012-04-20 MED ORDER — METOPROLOL TARTRATE 1 MG/ML IV SOLN
INTRAVENOUS | Status: AC
  Administered 2012-04-20: 7.5 mg via INTRAVENOUS
  Filled 2012-04-20: qty 5

## 2012-04-20 MED ORDER — SODIUM CHLORIDE 0.9 % IJ SOLN: 3.0000 mL | Freq: Two times a day (BID) | INTRAMUSCULAR | Status: DC

## 2012-04-20 MED ORDER — FLECAINIDE ACETATE 100 MG PO TABS: 50.0000 mg | ORAL_TABLET | Freq: Two times a day (BID) | ORAL | Status: DC

## 2012-04-20 MED ORDER — FLECAINIDE ACETATE 100 MG PO TABS
100.0000 mg | ORAL_TABLET | Freq: Two times a day (BID) | ORAL | Status: AC
  Administered 2012-04-20: 100 mg via ORAL
  Filled 2012-04-20: qty 1

## 2012-04-20 MED ORDER — METOPROLOL TARTRATE 25 MG PO TABS: 100.0000 mg | ORAL_TABLET | Freq: Two times a day (BID) | ORAL | Status: DC

## 2012-04-20 MED ORDER — METOPROLOL TARTRATE 25 MG PO TABS: 50.0000 mg | ORAL_TABLET | Freq: Two times a day (BID) | ORAL | Status: DC

## 2012-04-20 MED ORDER — SODIUM CHLORIDE 0.9 % IV SOLN
250.0000 mL | INTRAVENOUS | Status: DC
  Administered 2012-04-20: 250 mL via INTRAVENOUS

## 2012-04-20 MED ORDER — PROPOFOL 10 MG/ML IV BOLUS
INTRAVENOUS | Status: DC | PRN
  Administered 2012-04-20: 30 mg via INTRAVENOUS
  Administered 2012-04-20: 70 mg via INTRAVENOUS

## 2012-04-20 MED ORDER — METOPROLOL TARTRATE 1 MG/ML IV SOLN
5.0000 mg | Freq: Once | INTRAVENOUS | Status: AC
  Administered 2012-04-20: 7.5 mg via INTRAVENOUS
  Filled 2012-04-20: qty 5

## 2012-04-20 MED ORDER — METOPROLOL TARTRATE 1 MG/ML IV SOLN
2.5000 mg | Freq: Once | INTRAVENOUS | Status: DC
Start: 2012-04-20 — End: 2012-04-20
  Filled 2012-04-20: qty 5

## 2012-04-20 MED ORDER — SODIUM CHLORIDE 0.9 % IJ SOLN: 3.0000 mL | INTRAMUSCULAR | Status: DC | PRN

## 2012-04-20 NOTE — H&P (Signed)
  Please see paper chart  

## 2012-04-20 NOTE — Anesthesia Procedure Notes (Signed)
Procedure Name: MAC Date/Time: 04/20/2012 9:00 AM Performed by: Margaree Mackintosh Pre-anesthesia Checklist: Patient identified, Emergency Drugs available, Suction available, Patient being monitored and Timeout performed Patient Re-evaluated:Patient Re-evaluated prior to inductionOxygen Delivery Method: Ambu bag Preoxygenation: Pre-oxygenation with 100% oxygen Intubation Type: IV induction Ventilation: Mask ventilation without difficulty Dental Injury: Teeth and Oropharynx as per pre-operative assessment

## 2012-04-20 NOTE — Preoperative (Signed)
Beta Blockers   Reason not to administer Beta Blockers:Not Applicable 

## 2012-04-20 NOTE — Discharge Instructions (Signed)
Electrical Cardioversion Cardioversion is the delivery of a jolt of electricity to change the rhythm of the heart. Sticky patches or metal paddles are placed on the chest to deliver the electricity from a special device. This is done to restore a normal rhythm. A rhythm that is too fast or not regular keeps the heart from pumping well. Compared to medicines used to change an abnormal rhythm, cardioversion is faster and works better. It is also unpleasant and may dislodge blood clots from the heart. WHEN WOULD THIS BE DONE?  In an emergency:   There is low or no blood pressure as a result of the heart rhythm.   Normal rhythm must be restored as fast as possible to protect the brain and heart from further damage.   It may save a life.   For less serious heart rhythms, such as atrial fibrillation or flutter, in which:   The heart is beating too fast or is not regular.   The heart is still able to pump enough blood, but not as well as it should.   Medicine to change the rhythm has not worked.   It is safe to wait in order to allow time for preparation.  LET YOUR CAREGIVER KNOW ABOUT:   Every medicine you are taking. It is very important to do this! Know when to take or stop taking any of them.   Any time in the past that you have felt your heart was not beating normally.  RISKS AND COMPLICATIONS   Clots may form in the chambers of the heart if it is beating too fast. These clots may be dislodged during the procedure and travel to other parts of the body.   There is risk of a stroke during and after the procedure if a clot moves. Blood thinners lower this risk.   You may have a special test of your heart (TEE) to make sure there are no clots in your heart.  BEFORE THE PROCEDURE   You may have some tests to see how well your heart is working.   You may start taking blood thinners so your blood does not clot as easily.   Other drugs may be given to help your heart work better.   PROCEDURE (SCHEDULED)  The procedure is typically done in a hospital by a heart doctor (cardiologist).   You will be told when and where to go.   You may be given some medicine through an intravenous (IV) access to reduce discomfort and make you sleepy before the procedure.   Your whole body may move when the shock is delivered. Your chest may feel sore.   You may be able to go home after a few hours. Your heart rhythm will be watched to make sure it does not change.  HOME CARE INSTRUCTIONS   Only take medicine as directed by your caregiver. Be sure you understand how and when to take your medicine.   Learn how to feel your pulse and check it often.   Limit your activity for 48 hours.   Avoid caffeine and other stimulants as directed.  SEEK MEDICAL CARE IF:   You feel like your heart is beating too fast or your pulse is not regular.   You have any questions about your medicines.   You have bleeding that will not stop.  SEEK IMMEDIATE MEDICAL CARE IF:   You are dizzy or feel faint.   It is hard to breathe or you feel short of breath.     There is a change in discomfort in your chest.   Your speech is slurred or you have trouble moving your arm or leg on one side.   You get a muscle cramp.   Your fingers or toes turn cold or blue.  MAKE SURE YOU:   Understand these instructions.   Will watch your condition.   Will get help right away if you are not doing well or get worse.  Document Released: 12/05/2002 Document Revised: 12/04/2011 Document Reviewed: 04/05/2008 [employer] Patient Information 2012 [employer]. 

## 2012-04-20 NOTE — Anesthesia Preprocedure Evaluation (Addendum)
Anesthesia Evaluation  Patient identified by MRN, date of birth, ID band Patient awake    Reviewed: Allergy & Precautions, H&P , NPO status , Patient's Chart, lab work & pertinent test results, reviewed documented beta blocker date and time   Airway Mallampati: II TM Distance: >3 FB Neck ROM: Full    Dental  (+) Teeth Intact and Dental Advisory Given   Pulmonary shortness of breath, COPD Lung CA X 9 years  Stable no oxygen    Pulmonary exam normal       Cardiovascular hypertension, Pt. on medications and Pt. on home beta blockers + CAD Rhythm:Irregular Rate:Abnormal  Stress test negative   Neuro/Psych CVA    GI/Hepatic Neg liver ROS, GERD-  Poorly Controlled,  Endo/Other  Diabetes mellitus-, Poorly Controlled, Type 2, Oral Hypoglycemic AgentsHypothyroidism   Renal/GU negative Renal ROS     Musculoskeletal negative musculoskeletal ROS (+)   Abdominal Normal abdominal exam  (+)   Peds  Hematology negative hematology ROS (+)   Anesthesia Other Findings   Reproductive/Obstetrics                           Anesthesia Physical Anesthesia Plan  ASA: III  Anesthesia Plan: MAC   Post-op Pain Management:    Induction: Intravenous  Airway Management Planned: Mask  Additional Equipment:   Intra-op Plan:   Post-operative Plan:   Informed Consent: I have reviewed the patients History and Physical, chart, labs and discussed the procedure including the risks, benefits and alternatives for the proposed anesthesia with the patient or authorized representative who has indicated his/her understanding and acceptance.   Dental advisory given  Plan Discussed with: Anesthesiologist, Surgeon and CRNA  Anesthesia Plan Comments:        Anesthesia Quick Evaluation

## 2012-04-20 NOTE — CV Procedure (Signed)
Direct current cardioversion: Indication symptomatic A. Fibrillation. Procedure: Using 100 mg of IV Propofol for achieving deep (Moderate sedation), synchronized direct current cardioversion performed. Patient was delivered with 100J, 120 J x 3 then 150J Joules of electricity with success to NSR. Patient tolerated the procedure well. No immediate complication noted.

## 2012-04-20 NOTE — Interval H&P Note (Signed)
History and Physical Interval Note:  04/20/2012 8:47 AM  Kamron F Dray  has presented today for surgery, with the diagnosis of AFIB  The various methods of treatment have been discussed with the patient and family. After consideration of risks, benefits and other options for treatment, the patient has consented to  Procedure(s) (LRB): CARDIOVERSION (N/A) as a surgical intervention .  The patients' history has been reviewed, patient examined, no change in status, stable for surgery.  I have reviewed the patients' chart and labs.  Questions were answered to the patient's satisfaction.     Pamella Pert

## 2012-04-20 NOTE — Transfer of Care (Signed)
Immediate Anesthesia Transfer of Care Note  Patient: [name]  Procedure(s) Performed: Procedure(s) (LRB): CARDIOVERSION (N/A)  Patient Location: Short Stay  Anesthesia Type: MAC  Level of Consciousness: awake, alert  and oriented  Airway & Oxygen Therapy: Patient Spontanous Breathing and Patient connected to nasal cannula oxygen  Post-op Assessment: Report given to PACU RN and Post -op Vital signs reviewed and stable  Post vital signs: Reviewed and stable  Complications: No apparent anesthesia complications

## 2012-04-20 NOTE — Anesthesia Postprocedure Evaluation (Signed)
  Anesthesia Post-op Note  Patient: [name]  Procedure(s) Performed: Procedure(s) (LRB): CARDIOVERSION (N/A)  Patient Location: Short Stay  Anesthesia Type: MAC  Level of Consciousness: awake, alert  and oriented  Airway and Oxygen Therapy: Patient Spontanous Breathing  Post-op Pain: mild, at chest pad site  Post-op Assessment: Post-op Vital signs reviewed, Patient's Cardiovascular Status Stable, Respiratory Function Stable, Patent Airway and No signs of Nausea or vomiting  Post-op Vital Signs: Reviewed and stable  Complications: No apparent anesthesia complications

## 2012-04-21 ENCOUNTER — Encounter (HOSPITAL_COMMUNITY): Payer: Self-pay | Admitting: Cardiology

## 2012-04-21 LAB — GLUCOSE, CAPILLARY: Glucose-Capillary: 344 mg/dL — ABNORMAL HIGH (ref 70–99)

## 2012-08-26 ENCOUNTER — Other Ambulatory Visit: Payer: Self-pay | Admitting: Endocrinology

## 2012-08-26 DIAGNOSIS — Z1231 Encounter for screening mammogram for malignant neoplasm of breast: Secondary | ICD-10-CM

## 2012-10-02 ENCOUNTER — Encounter (HOSPITAL_COMMUNITY): Payer: Self-pay | Admitting: Emergency Medicine

## 2012-10-02 ENCOUNTER — Emergency Department (HOSPITAL_COMMUNITY): Payer: Medicare Other

## 2012-10-02 ENCOUNTER — Emergency Department (HOSPITAL_COMMUNITY)
Admission: EM | Admit: 2012-10-02 | Discharge: 2012-10-02 | Disposition: A | Discharge status: Home / Self Care | Payer: Medicare Other | Attending: Emergency Medicine | Admitting: Emergency Medicine

## 2012-10-02 DIAGNOSIS — S0990XA Unspecified injury of head, initial encounter: Secondary | ICD-10-CM | POA: Insufficient documentation

## 2012-10-02 DIAGNOSIS — W108XXA Fall (on) (from) other stairs and steps, initial encounter: Secondary | ICD-10-CM | POA: Insufficient documentation

## 2012-10-02 DIAGNOSIS — Z8673 Personal history of transient ischemic attack (TIA), and cerebral infarction without residual deficits: Secondary | ICD-10-CM | POA: Insufficient documentation

## 2012-10-02 DIAGNOSIS — I2510 Atherosclerotic heart disease of native coronary artery without angina pectoris: Secondary | ICD-10-CM | POA: Insufficient documentation

## 2012-10-02 DIAGNOSIS — C3490 Malignant neoplasm of unspecified part of unspecified bronchus or lung: Secondary | ICD-10-CM

## 2012-10-02 DIAGNOSIS — E119 Type 2 diabetes mellitus without complications: Secondary | ICD-10-CM | POA: Insufficient documentation

## 2012-10-02 DIAGNOSIS — I10 Essential (primary) hypertension: Secondary | ICD-10-CM | POA: Insufficient documentation

## 2012-10-02 DIAGNOSIS — Y92009 Unspecified place in unspecified non-institutional (private) residence as the place of occurrence of the external cause: Secondary | ICD-10-CM | POA: Insufficient documentation

## 2012-10-02 DIAGNOSIS — S2239XA Fracture of one rib, unspecified side, initial encounter for closed fracture: Secondary | ICD-10-CM

## 2012-10-02 DIAGNOSIS — Z79899 Other long term (current) drug therapy: Secondary | ICD-10-CM | POA: Insufficient documentation

## 2012-10-02 LAB — CBC WITH DIFFERENTIAL/PLATELET
Basophils Relative: 1 % (ref 0–1)
HCT: 37.8 % (ref 36.0–46.0)
Hemoglobin: 13.6 g/dL (ref 12.0–15.0)
Lymphs Abs: 2.4 10*3/uL (ref 0.7–4.0)
MCH: 31.1 pg (ref 26.0–34.0)
MCHC: 36 g/dL (ref 30.0–36.0)
Monocytes Absolute: 0.7 10*3/uL (ref 0.1–1.0)
Monocytes Relative: 10 % (ref 3–12)
Neutro Abs: 4.1 10*3/uL (ref 1.7–7.7)
RBC: 4.38 MIL/uL (ref 3.87–5.11)

## 2012-10-02 LAB — URINALYSIS, ROUTINE W REFLEX MICROSCOPIC
Glucose, UA: >1000 mg/dL — AB
Ketones, ur: 15 mg/dL — AB
Leukocytes, UA: NEGATIVE
Nitrite: NEGATIVE
Protein, ur: NEGATIVE mg/dL
Urobilinogen, UA: 0.2 mg/dL (ref 0.0–1.0)

## 2012-10-02 LAB — COMPREHENSIVE METABOLIC PANEL
Albumin: 4.1 g/dL (ref 3.5–5.2)
BUN: 11 mg/dL (ref 6–23)
Chloride: 100 mEq/L (ref 96–112)
Creatinine, Ser: 0.82 mg/dL (ref 0.50–1.10)
GFR calc Af Amer: 79 mL/min — ABNORMAL LOW (ref >90)
GFR calc non Af Amer: 68 mL/min — ABNORMAL LOW (ref >90)
Glucose, Bld: 346 mg/dL — ABNORMAL HIGH (ref 70–99)
Total Bilirubin: 0.4 mg/dL (ref 0.3–1.2)

## 2012-10-02 LAB — POCT I-STAT TROPONIN I: Troponin i, poc: 0 ng/mL (ref 0.00–0.08)

## 2012-10-02 MED ORDER — HYDROMORPHONE HCL 4 MG PO TABS: 2.0000 mg | ORAL_TABLET | ORAL | Status: DC | PRN

## 2012-10-02 MED ORDER — FENTANYL CITRATE 0.05 MG/ML IJ SOLN
50.0000 ug | Freq: Once | INTRAMUSCULAR | Status: AC
  Administered 2012-10-02: 50 ug via INTRAVENOUS
  Filled 2012-10-02: qty 2

## 2012-10-02 MED ORDER — SODIUM CHLORIDE 0.9 % IV BOLUS (SEPSIS)
500.0000 mL | Freq: Once | INTRAVENOUS | Status: AC
  Administered 2012-10-02: 1000 mL via INTRAVENOUS

## 2012-10-02 NOTE — ED Notes (Signed)
Pt reports fell down 6 stairs Wednesday night. Pt reports near syncope at that time. Pt c/o pain to upper back.

## 2012-10-02 NOTE — ED Notes (Signed)
Patient discharged with instructions using the teach back method. Patient verbalizes an understanding. 

## 2012-10-02 NOTE — ED Provider Notes (Signed)
History   This chart was scribed for Hurman Horn, MD by Melba Coon. The patient was seen in room TR09C/TR09C and the patient's care was started at 2:50PM.    CSN: [id number]  Arrival date & time 10/02/12  1316   None     Chief Complaint  Patient presents with  . Fall    (Consider location/radiation/quality/duration/timing/severity/associated sxs/prior treatment) The history is provided by the patient. No language interpreter was used.   [name] is a 78 y.o. female who presents to the Emergency Department complaining of constant, moderate to severe left chest pain and chronic upper back pain that is worsened with an onset 3 days ago pertaining to a non-witnessed fall with head contact but no LOC. Mrs Caton reports falling down 6 stairs trying to clean her mother's house and remembers everything before the fall. Ambulation is decreased compared to baseline with pain and can't stand up straight without pain. Deep inhalation aggravates the pain. Tylenol is not alleviating the pain. She also c/o 4-5 bloody stools since the fall. Denies HA, confusion, fever, neck pain, sore throat, rash, SOB, abd pain, n/v/d, dysuria, or extremity pain, edema, weakness, numbness, or tingling. Hx of bilateral lung cancer that hasn't metastasized. She is on rivaroxaban for atrial fibulation. No other pertinent medical symptoms.  Cardio: Dr Jacinto Halim   Past Medical History  Diagnosis Date  . Lung cancer   . Stroke   . Diabetes mellitus   . CAD (coronary artery disease) 03/13/2012  . Type II or unspecified type diabetes mellitus without mention of complication, not stated as uncontrolled 03/13/2012  . Hypothyroid 03/13/2012  . HTN (hypertension) 03/13/2012    Past Surgical History  Procedure Date  . Cardioversion 04/20/2012    Procedure: CARDIOVERSION;  Surgeon: Pamella Pert, MD;  Location: [facility] OR;  Service: Cardiovascular;  Laterality: N/A;    No family history on file.  History    Substance Use Topics  . Smoking status: Never Smoker   . Smokeless tobacco: Not on file  . Alcohol Use: No    OB History    Grav Para Term Preterm Abortions TAB SAB Ect Mult Living                  Review of Systems 10 Systems reviewed and all are negative for acute change except as noted in the HPI.   Allergies  Norco and Codeine  Home Medications   Current Outpatient Rx  Name Route Sig Dispense Refill  . ACETAMINOPHEN 500 MG PO TABS Oral Take 500-1,000 mg by mouth every 6 (six) hours as needed. For pain    . CALCIUM CARBONATE-VITAMIN D 500-200 MG-UNIT PO TABS Oral Take 1 tablet by mouth daily.    . COLESEVELAM HCL 625 MG PO TABS Oral Take 625 mg by mouth daily.    Marland Kitchen DIGOXIN 0.125 MG PO TABS Oral Take 0.125 mg by mouth daily.    Marland Kitchen DILTIAZEM HCL ER COATED BEADS 180 MG PO CP24 Oral Take 180 mg by mouth 2 (two) times daily.    Marland Kitchen FLECAINIDE ACETATE 100 MG PO TABS Oral Take 100 mg by mouth 2 (two) times daily.    Marland Kitchen LEVOTHYROXINE SODIUM 150 MCG PO TABS Oral Take 150 mcg by mouth daily.    Marland Kitchen MECLIZINE HCL 25 MG PO TABS Oral Take 25 mg by mouth 4 (four) times daily as needed. For dizziness    . METFORMIN HCL ER 500 MG PO TB24 Oral Take 1,000 mg by  mouth daily with breakfast.    . METOPROLOL TARTRATE 25 MG PO TABS Oral Take 2 tablets (50 mg total) by mouth 2 (two) times daily.    Marland Kitchen RIVAROXABAN 15 MG PO TABS Oral Take 15 mg by mouth daily with supper.    Marland Kitchen VALSARTAN 320 MG PO TABS Oral Take 320 mg by mouth daily.    Marland Kitchen HYDROMORPHONE HCL 4 MG PO TABS Oral Take 0.5 tablets (2 mg total) by mouth every 4 (four) hours as needed for pain. 15 tablet 0    BP 159/104  Pulse 122  Temp 98.4 F (36.9 C) (Oral)  Resp 24  Ht 5\' 3"  (1.6 m)  Wt 130 lb (58.968 kg)  BMI 23.03 kg/m2  SpO2 96%  Physical Exam  Nursing note and vitals reviewed. Constitutional:       Awake, alert, nontoxic appearance with baseline speech for patient.  HENT:  Head: Atraumatic.  Mouth/Throat: No oropharyngeal  exudate.  Eyes: EOM are normal. Pupils are equal, round, and reactive to light. Right eye exhibits no discharge. Left eye exhibits no discharge.  Neck: Neck supple.       Mild C spine tenderness that is chronic  Cardiovascular: An irregular rhythm present. Tachycardia present.   No murmur heard. Pulmonary/Chest: Effort normal and breath sounds normal. No stridor. No respiratory distress. She has no wheezes. She has no rales. She exhibits tenderness (left lateral chest wall that is exquisitely tender to touch).  Abdominal: Soft. Bowel sounds are normal. She exhibits no mass. There is no tenderness. There is no rebound.  Musculoskeletal: She exhibits no tenderness.       No midline back spine tenderness  Lymphadenopathy:    She has no cervical adenopathy.  Neurological:       Awake, alert, cooperative and aware of situation; motor strength bilaterally; sensation normal to light touch bilaterally; peripheral visual fields full to confrontation; no facial asymmetry; tongue midline; major cranial nerves appear intact; no pronator drift, normal finger to nose bilaterally, pt states gait has been normal to baseline since the fall with no pain to lower extremities.   Skin: No rash noted.  Psychiatric: She has a normal mood and affect.    ED Course  Procedures (including critical care time) ECG: Atrial fibrillation, ventricular rate 101, all axis, nonspecific ST and T wave changes, when compared with April 2013 patient is now in atrial fibrillation and has more evidence nonspecific ST and T changes  COORDINATION OF CARE:  2:54PM - head CT, C-spine CT, CXR, EKG, blood w/u, UA, and liver tests will be ordered for the pt. She will be going to CDU for further treatment. 6:30PM - labs and imaging reviewed; results are unremarkable. Pt is given and Rx dilaudid and is ready for d/c.   Labs Reviewed  CBC WITH DIFFERENTIAL - Abnormal; Notable for the following:    Platelets 147 (*)     All other  components within normal limits  COMPREHENSIVE METABOLIC PANEL - Abnormal; Notable for the following:    Glucose, Bld 346 (*)     GFR calc non Af Amer 68 (*)     GFR calc Af Amer 79 (*)     All other components within normal limits  URINALYSIS, ROUTINE W REFLEX MICROSCOPIC - Abnormal; Notable for the following:    Glucose, UA >1000 (*)     Ketones, ur 15 (*)     All other components within normal limits  URINE MICROSCOPIC-ADD ON - Abnormal; Notable for the  following:    Squamous Epithelial / LPF FEW (*)     All other components within normal limits  POCT I-STAT TROPONIN I  LAB REPORT - SCANNED   No results found.   1. Rib fracture   2. Minor head injury   3. Lung cancer       MDM  Patient / Family / Caregiver informed of clinical course, understand medical decision-making process, and agree with plan. I personally performed the services described in this documentation, which was scribed in my presence. The recorded information has been reviewed and considered. I doubt any other EMC precluding discharge at this time.        Hurman Horn, MD 10/05/12 [phone]

## 2012-10-04 ENCOUNTER — Ambulatory Visit: Payer: Medicare Other

## 2012-10-08 ENCOUNTER — Ambulatory Visit: Payer: Medicare Other

## 2012-11-01 ENCOUNTER — Ambulatory Visit: Payer: Medicare Other

## 2012-12-06 ENCOUNTER — Ambulatory Visit
Admission: RE | Admit: 2012-12-06 | Discharge: 2012-12-06 | Disposition: A | Discharge status: Home / Self Care | Payer: Medicare Other | Source: Ambulatory Visit | Attending: Endocrinology | Admitting: Endocrinology

## 2012-12-06 DIAGNOSIS — Z1231 Encounter for screening mammogram for malignant neoplasm of breast: Secondary | ICD-10-CM

## 2013-03-29 ENCOUNTER — Observation Stay (HOSPITAL_COMMUNITY)
Admission: AD | Admit: 2013-03-29 | Discharge: 2013-03-30 | Disposition: A | Discharge status: Home / Self Care | Payer: Medicare Other | Source: Ambulatory Visit | Attending: Cardiology | Admitting: Cardiology

## 2013-03-29 DIAGNOSIS — R5381 Other malaise: Secondary | ICD-10-CM | POA: Insufficient documentation

## 2013-03-29 DIAGNOSIS — E785 Hyperlipidemia, unspecified: Secondary | ICD-10-CM | POA: Insufficient documentation

## 2013-03-29 DIAGNOSIS — I4891 Unspecified atrial fibrillation: Principal | ICD-10-CM | POA: Insufficient documentation

## 2013-03-29 DIAGNOSIS — E119 Type 2 diabetes mellitus without complications: Secondary | ICD-10-CM | POA: Insufficient documentation

## 2013-03-29 DIAGNOSIS — R0609 Other forms of dyspnea: Secondary | ICD-10-CM | POA: Insufficient documentation

## 2013-03-29 DIAGNOSIS — I2510 Atherosclerotic heart disease of native coronary artery without angina pectoris: Secondary | ICD-10-CM | POA: Insufficient documentation

## 2013-03-29 DIAGNOSIS — R0989 Other specified symptoms and signs involving the circulatory and respiratory systems: Secondary | ICD-10-CM | POA: Insufficient documentation

## 2013-03-29 DIAGNOSIS — C3490 Malignant neoplasm of unspecified part of unspecified bronchus or lung: Secondary | ICD-10-CM | POA: Insufficient documentation

## 2013-03-29 HISTORY — DX: Shortness of breath: R06.02

## 2013-03-29 HISTORY — DX: Cardiac arrhythmia, unspecified: I49.9

## 2013-03-29 LAB — GLUCOSE, CAPILLARY: Glucose-Capillary: 184 mg/dL — ABNORMAL HIGH (ref 70–99)

## 2013-03-29 MED ORDER — ONDANSETRON HCL 4 MG/2ML IJ SOLN: 4.0000 mg | Freq: Four times a day (QID) | INTRAMUSCULAR | Status: DC | PRN

## 2013-03-29 MED ORDER — DILTIAZEM HCL ER COATED BEADS 180 MG PO CP24
180.0000 mg | ORAL_CAPSULE | Freq: Two times a day (BID) | ORAL | Status: DC
  Administered 2013-03-29 – 2013-03-30 (×2): 180 mg via ORAL
  Filled 2013-03-29 (×3): qty 1

## 2013-03-29 MED ORDER — RIVAROXABAN 15 MG PO TABS
15.0000 mg | ORAL_TABLET | Freq: Every day | ORAL | Status: DC
  Administered 2013-03-29: 15 mg via ORAL
  Filled 2013-03-29 (×2): qty 1

## 2013-03-29 MED ORDER — SODIUM CHLORIDE 0.9 % IV SOLN: 250.0000 mL | INTRAVENOUS | Status: DC | PRN

## 2013-03-29 MED ORDER — MECLIZINE HCL 25 MG PO TABS
25.0000 mg | ORAL_TABLET | Freq: Four times a day (QID) | ORAL | Status: DC | PRN
  Filled 2013-03-29: qty 1

## 2013-03-29 MED ORDER — SOTALOL HCL 80 MG PO TABS
80.0000 mg | ORAL_TABLET | Freq: Two times a day (BID) | ORAL | Status: DC
  Filled 2013-03-29 (×2): qty 1

## 2013-03-29 MED ORDER — METFORMIN HCL ER 500 MG PO TB24
1000.0000 mg | ORAL_TABLET | Freq: Every day | ORAL | Status: DC
  Administered 2013-03-30: 1000 mg via ORAL
  Filled 2013-03-29 (×2): qty 2

## 2013-03-29 MED ORDER — SOTALOL HCL 80 MG PO TABS
80.0000 mg | ORAL_TABLET | Freq: Two times a day (BID) | ORAL | Status: DC
  Administered 2013-03-29 – 2013-03-30 (×2): 80 mg via ORAL
  Filled 2013-03-29 (×4): qty 1

## 2013-03-29 MED ORDER — LEVOTHYROXINE SODIUM 150 MCG PO TABS
150.0000 ug | ORAL_TABLET | Freq: Every day | ORAL | Status: DC
  Administered 2013-03-30: 150 ug via ORAL
  Filled 2013-03-29 (×2): qty 1

## 2013-03-29 MED ORDER — COLESEVELAM HCL 625 MG PO TABS
625.0000 mg | ORAL_TABLET | Freq: Every day | ORAL | Status: DC | PRN
  Filled 2013-03-29: qty 1

## 2013-03-29 MED ORDER — DIGOXIN 125 MCG PO TABS
0.1250 mg | ORAL_TABLET | Freq: Every day | ORAL | Status: DC
  Filled 2013-03-29: qty 1

## 2013-03-29 MED ORDER — CALCIUM CARBONATE-VITAMIN D 500-200 MG-UNIT PO TABS
1.0000 | ORAL_TABLET | Freq: Every day | ORAL | Status: DC
  Administered 2013-03-30: 1 via ORAL
  Filled 2013-03-29 (×2): qty 1

## 2013-03-29 MED ORDER — ACETAMINOPHEN 325 MG PO TABS: 650.0000 mg | ORAL_TABLET | ORAL | Status: DC | PRN

## 2013-03-29 MED ORDER — ALPRAZOLAM 0.25 MG PO TABS: 0.2500 mg | ORAL_TABLET | Freq: Two times a day (BID) | ORAL | Status: DC | PRN

## 2013-03-29 MED ORDER — ADULT MULTIVITAMIN W/MINERALS CH
1.0000 | ORAL_TABLET | Freq: Every day | ORAL | Status: DC
  Administered 2013-03-30: 1 via ORAL
  Filled 2013-03-29 (×2): qty 1

## 2013-03-29 MED ORDER — IRBESARTAN 75 MG PO TABS
75.0000 mg | ORAL_TABLET | Freq: Every day | ORAL | Status: DC
  Administered 2013-03-30: 75 mg via ORAL
  Filled 2013-03-29 (×2): qty 1

## 2013-03-29 MED ORDER — SODIUM CHLORIDE 0.9 % IJ SOLN: 3.0000 mL | INTRAMUSCULAR | Status: DC | PRN

## 2013-03-29 MED ORDER — SODIUM CHLORIDE 0.9 % IJ SOLN
3.0000 mL | Freq: Two times a day (BID) | INTRAMUSCULAR | Status: DC
  Administered 2013-03-29: 3 mL via INTRAVENOUS

## 2013-03-29 MED ORDER — SOTALOL HCL 80 MG PO TABS
80.0000 mg | ORAL_TABLET | Freq: Two times a day (BID) | ORAL | Status: DC
  Filled 2013-03-29: qty 1

## 2013-03-29 MED ORDER — ATORVASTATIN CALCIUM 10 MG PO TABS
10.0000 mg | ORAL_TABLET | Freq: Every day | ORAL | Status: DC
  Administered 2013-03-29 – 2013-03-30 (×2): 10 mg via ORAL
  Filled 2013-03-29 (×2): qty 1

## 2013-03-29 MED ORDER — SODIUM CHLORIDE 0.9 % IJ SOLN: 3.0000 mL | Freq: Two times a day (BID) | INTRAMUSCULAR | Status: DC

## 2013-03-29 MED ORDER — SOTALOL HCL 80 MG PO TABS: 80.0000 mg | ORAL_TABLET | Freq: Two times a day (BID) | ORAL | Status: DC

## 2013-03-29 NOTE — H&P (Signed)
[name] is an 79 y.o. female.   Chief Complaint: Atrial fibrillation and fatigue. HPI: The patient is a 79 year old female who presents for a recheck of Atrial fibrillation. 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  She is tolerating the medications well and since being on flecainide she has noticed tremors and I had decreased the dose to 50 mg twice a day, and she now has relapse of tremors. otherwise she is tolerating all the medications. She essentially remained asymptomatic. She has not had any bleeding diathesis on Xarelto.   Past Medical History  Diagnosis Date  . Lung cancer   . Stroke   . Diabetes mellitus   . CAD (coronary artery disease) 03/13/2012  . Type II or unspecified type diabetes mellitus without mention of complication, not stated as uncontrolled 03/13/2012  . Hypothyroid 03/13/2012  . HTN (hypertension) 03/13/2012    Past Surgical History  Procedure Laterality Date  . Cardioversion  04/20/2012    Procedure: CARDIOVERSION;  Surgeon: Pamella Pert, MD;  Location: [facility] OR;  Service: Cardiovascular;  Laterality: N/A;    No family history on file. Social History:  reports that she has never smoked. She does not have any smokeless tobacco history on file. She reports that she does not drink alcohol. Her drug history is not on file.  Allergies:  Allergies  Allergen Reactions  . Norco (Hydrocodone-Acetaminophen) Nausea And Vomiting  . Codeine Nausea And Vomiting    Medications Prior to Admission  Medication Sig Dispense Refill  . acetaminophen (TYLENOL) 500 MG tablet Take 500-1,000 mg by mouth every 6 (six) hours as needed for pain.       Marland Kitchen atorvastatin (LIPITOR) 10 MG tablet Take 10 mg by  mouth daily.      . calcium-vitamin D (OSCAL WITH D) 500-200 MG-UNIT per tablet Take 1 tablet by mouth daily.      . colesevelam (WELCHOL) 625 MG tablet Take 625 mg by mouth daily as needed (for loose stool).       Marland Kitchen digoxin (LANOXIN) 0.125 MG tablet Take 0.125 mg by mouth daily.      Marland Kitchen diltiazem (CARDIZEM CD) 180 MG 24 hr capsule Take 180 mg by mouth 2 (two) times daily.      Marland Kitchen levothyroxine (SYNTHROID, LEVOTHROID) 150 MCG tablet Take 150 mcg by mouth daily.      . meclizine (ANTIVERT) 25 MG tablet Take 25 mg by mouth 4 (four) times daily as needed for dizziness.       . metFORMIN (GLUCOPHAGE-XR) 500 MG 24 hr tablet Take 1,000 mg by mouth daily with breakfast.      . Multiple Vitamin (MULTIVITAMIN WITH MINERALS) TABS Take 1 tablet by mouth daily.      . Rivaroxaban (XARELTO) 15 MG TABS tablet Take 15 mg by mouth daily with supper.      . sotalol (BETAPACE) 80 MG tablet Take 80 mg by mouth 2 (two) times daily.      . valsartan (DIOVAN) 320 MG tablet Take 320 mg by mouth daily.       Review of Systems -  General:Present- Fatigue. Not Present- Tiredness and Unable to Sleep Lying Flat. HEENT:Not Present- Blurred Vision. Respiratory:Present- Decreased Exercise Tolerance and Difficulty Breathing on Exertion. Not  Present- Bloody sputum and Wakes up from Sleep Wheezing or Short of Breath. Cardiovascular:Present- Palpitations. Not Present- Edema, Leg Cramps and Paroxysmal Nocturnal Dyspnea. Gastrointestinal:Not Present- Black, Tarry Stool, Bloody Stool and Heartburn. Musculoskeletal:Present- Backache (uses a brace all the times). Not Present- Claudication and Joint Pain. Neurological:Not Present- Focal Neurological Symptoms. Psychiatric:Not Present- Suicidal Ideation and Personality Changes. Hematology:Not Present- Blood Clots, Easy Bruising and Nose Bleed. All other systems negative  Blood pressure 117/73, pulse 97, temperature 97.4 F (36.3 C), resp. rate 18, height 5\' 3"  (1.6 m), weight  56.7 kg (125 lb), SpO2 100.00%.   GENERAL: Moderately built and normal body habitus, who is in no acute distress. Alert and oriented x 3. Appears stated age.   HEENT: normal limits. PERRLA, No JVD.   CARDIAC EXAM: S1 variable and irregular, S2 normal, No murmur or gallop.   CHEST EXAM: No tenderness of chest wall. LUNGS: Clear to percuss and auscultate. No rales or ronchi.   ABDOMEN: No hepatosplenomegaly. BS normal in all 4 quadrants. Abdomen is non-tender.   EXTREMITY: Full range of movementes, No edema. MUSCULOSKELETAL EXAM: Intact with full range of motion in all 4 extremities.   NEUROLOGIC EXAM: Grossly intact without any focal deficits. Alert O x 3.   VASCULAR EXAM: No skin breakdown. Carotids normal. Extremities: Femoral pulse normal. Popliteal pulse normal ; Pedal pulse normal. Otherwise no bruit. No prominent pulse felt in the abdomen. No varicose veins.   Results for orders placed during the hospital encounter of 03/29/13 (from the past 48 hour(s))  GLUCOSE, CAPILLARY     Status: Abnormal   Collection Time    03/29/13  1:09 PM      Result Value Range   Glucose-Capillary 171 (*) 70 - 99 mg/dL  GLUCOSE, CAPILLARY     Status: Abnormal   Collection Time    03/29/13  4:35 PM      Result Value Range   Glucose-Capillary 184 (*) 70 - 99 mg/dL   No results found.  Labs:   Lab Results  Component Value Date   WBC 7.3 10/02/2012   HGB 13.6 10/02/2012   HCT 37.8 10/02/2012   MCV 86.3 10/02/2012   PLT 147* 10/02/2012   No results found for this basename: NA, K, CL, CO2, BUN, CREATININE, CALCIUM, LABALBU, PROT, BILITOT, ALKPHOS, ALT, AST, GLUCOSE,  in the last 168 hours Lab Results  Component Value Date   CKTOTAL 66 03/14/2012   CKMB 2.0 03/14/2012   TROPONINI <0.30 03/14/2012    Lipid Panel  EKG: atrial fibrillation with ventricular rate of 72 bpm, normal axis, nonspecific ST segment-T-wave abnormality, probably digitalis effect.  Normal QT  interval..   Assessment/Plan 1. A. Fibrillation with controlled ventricular response. 2. Hyperlipidemia.  Labs to 02/24/2013 (OP): Total cholesterol 200, triglycerides 184, HDL 38, LDL 125. LDL particle #2357. LP-IR score 70. Blood sugar elevated at 205. BUN 9, serum creatinine 0.8. Electrolytes are within normal limits. 3. Diabetes Mellitus II uncontrolled.  4. CAD: CAD: Mid LAD 50-60%, Diffuse distal LAD disease, D1 ostial 80%, mid 60% stenosis. Normal Circumflex and RCA by Cath 10/07. 5. Hypertension. 6. Chronic dyspnea and pulmonary infiltrative disease/slow growing lung cancer follows [facility].  Recommendation: I am placing the patient under observation for administration of sotalol.  She could not tolerate flecainide or Tambocor due to significant tremors.  Given her underlying lung issues, I do not want to start amiodarone.  I plan to discharge her in the morning if she remains stable after receiving second dose.  I'll repeat EKG in the morning. This is to evaluate her QT interval.  Pamella Pert, MD 03/29/2013, 5:33 PM [facility] Pager: [phone] Office: [phone] If no answer: Cell:  [phone]

## 2013-03-30 ENCOUNTER — Encounter (HOSPITAL_COMMUNITY): Payer: Self-pay | Admitting: General Practice

## 2013-03-30 LAB — BASIC METABOLIC PANEL
CO2: 29 mEq/L (ref 19–32)
Chloride: 102 mEq/L (ref 96–112)
Glucose, Bld: 198 mg/dL — ABNORMAL HIGH (ref 70–99)
Sodium: 141 mEq/L (ref 135–145)

## 2013-03-30 LAB — GLUCOSE, CAPILLARY
Glucose-Capillary: 179 mg/dL — ABNORMAL HIGH (ref 70–99)
Glucose-Capillary: 187 mg/dL — ABNORMAL HIGH (ref 70–99)

## 2013-03-30 NOTE — Progress Notes (Signed)
Pt provided with dc instructions and education. Pt verbalized understanding. Pt educated on all medications and how to take them. No questions at this time. VSS. IV removed with tip intact. Heart monitor cleaned and returned to front. Levonne Spiller, RN

## 2013-03-30 NOTE — Discharge Summary (Signed)
Physician Discharge Summary  Patient ID: [name] MRN: [id number] DOB/AGE: 77-Jul-1935 77 y.o.  Admit date: 03/29/2013 Discharge date: 03/30/2013  Primary Discharge Diagnosis Paroxysmal A. Fibrillation  Secondary Discharge Diagnosis 2. Hyperlipidemia.  Labs to 02/24/2013 (OP): Total cholesterol 200, triglycerides 184, HDL 38, LDL 125. LDL particle #2357. LP-IR score 70. Blood sugar elevated at 205. BUN 9, serum creatinine 0.8. Electrolytes are within normal limits.  3. Diabetes Mellitus II uncontrolled.  4. CAD: CAD: Mid LAD 50-60%, Diffuse distal LAD disease, D1 ostial 80%, mid 60% stenosis. Normal Circumflex and RCA by Cath 10/07.  5. Hypertension.  6. Chronic dyspnea and pulmonary infiltrative disease/slow growing lung cancer follows [facility].  Significant Diagnostic Studies: EKG on admission on 03/29/2013 for baseline QT interval, revealing atrial fibrillation with controlled ventricular response. EKG 03-2013, atrial fibrillation with controlled ventricular response. QT interval is normal. Nonspecific ST segment changes in the inferior and lateral leads probably digitalis effect. No symmetric change from prior EKG. QT was minimally elevated, but within normal limits.  Hospital Course: Patient was admitted for 24 hours for therapeutic drug monitoring. Patient received sotalol and did well without any side effects. She essentially remained asymptomatic. Hence the following day she was felt stable for discharge.   Recommendations on discharge: Patient will be seen in the office in 2-3 weeks, following which if she is still in atrial fibrillation, I will set her up for elective outpatient cardioversion. She is presently on anticoagulation consult to and will continue the same. I have discontinued digoxin as patient is on Cardizem along with sotalol.  Discharge Exam: Blood pressure 113/72, pulse 71, temperature 98 F (36.7 C), resp. rate 18, height 5\' 3"  (1.6 m), weight 56.7 kg (125 lb), SpO2  96.00%.   GENERAL: Moderately built and normal body habitus, who is in no acute distress. Alert and oriented x 3. Appears stated age.  HEENT: normal limits. PERRLA, No JVD.  CARDIAC EXAM: S1 variable and irregular, S2 normal, No murmur or gallop.  CHEST EXAM: No tenderness of chest wall. LUNGS: Clear to percuss and auscultate. No rales or ronchi.  ABDOMEN: No hepatosplenomegaly. BS normal in all 4 quadrants. Abdomen is non-tender.  EXTREMITY: Full range of movementes, No edema. MUSCULOSKELETAL EXAM: Intact with full range of motion in all 4 extremities.  NEUROLOGIC EXAM: Grossly intact without any focal deficits. Alert O x 3.  VASCULAR EXAM: No skin breakdown. Carotids normal. Extremities: Femoral pulse normal. Popliteal pulse normal ; Pedal pulse normal. Otherwise no bruit. No prominent pulse felt in the abdomen. No varicose veins.  Labs:   Lab Results  Component Value Date   WBC 7.3 10/02/2012   HGB 13.6 10/02/2012   HCT 37.8 10/02/2012   MCV 86.3 10/02/2012   PLT 147* 10/02/2012    Recent Labs Lab 03/30/13 0437  NA 141  K 3.4*  CL 102  CO2 29  BUN 16  CREATININE 1.00  CALCIUM 9.1  GLUCOSE 198*   Lab Results  Component Value Date   CKTOTAL 66 03/14/2012   CKMB 2.0 03/14/2012   TROPONINI <0.30 03/14/2012    Lipid Panel  See impression.  FOLLOW UP PLANS AND APPOINTMENTS    Medication List    STOP taking these medications       digoxin 0.125 MG tablet  Commonly known as:  LANOXIN      TAKE these medications       acetaminophen 500 MG tablet  Commonly known as:  TYLENOL  Take 500-1,000 mg by mouth every  6 (six) hours as needed for pain.     atorvastatin 10 MG tablet  Commonly known as:  LIPITOR  Take 10 mg by mouth daily.     calcium-vitamin D 500-200 MG-UNIT per tablet  Commonly known as:  OSCAL WITH D  Take 1 tablet by mouth daily.     colesevelam 625 MG tablet  Commonly known as:  WELCHOL  Take 625 mg by mouth daily as needed (for loose stool).      diltiazem 180 MG 24 hr capsule  Commonly known as:  CARDIZEM CD  Take 180 mg by mouth 2 (two) times daily.     levothyroxine 150 MCG tablet  Commonly known as:  SYNTHROID, LEVOTHROID  Take 150 mcg by mouth daily.     meclizine 25 MG tablet  Commonly known as:  ANTIVERT  Take 25 mg by mouth 4 (four) times daily as needed for dizziness.     metFORMIN 500 MG 24 hr tablet  Commonly known as:  GLUCOPHAGE-XR  Take 1,000 mg by mouth daily with breakfast.     multivitamin with minerals Tabs  Take 1 tablet by mouth daily.     Rivaroxaban 15 MG Tabs tablet  Commonly known as:  XARELTO  Take 15 mg by mouth daily with supper.     sotalol 80 MG tablet  Commonly known as:  BETAPACE  Take 80 mg by mouth 2 (two) times daily.     valsartan 320 MG tablet  Commonly known as:  DIOVAN  Take 320 mg by mouth daily.           Follow-up Information   Follow up with [facility], MD. (In 3 weeks as previously scheduled)    Contact information:   [address] [place] [address] [phone]        Pamella Pert, MD 03/30/2013, 8:45 AM  Pager: [phone] Office: [phone] If no answer: [phone]

## 2013-05-10 ENCOUNTER — Ambulatory Visit (HOSPITAL_COMMUNITY): Payer: Medicare Other | Admitting: Anesthesiology

## 2013-05-10 ENCOUNTER — Other Ambulatory Visit: Payer: Self-pay

## 2013-05-10 ENCOUNTER — Encounter (HOSPITAL_COMMUNITY): Payer: Self-pay | Admitting: *Deleted

## 2013-05-10 ENCOUNTER — Ambulatory Visit (HOSPITAL_COMMUNITY)
Admission: RE | Admit: 2013-05-10 | Discharge: 2013-05-10 | Disposition: A | Discharge status: Home / Self Care | Payer: Medicare Other | Source: Ambulatory Visit | Attending: Cardiology | Admitting: Cardiology

## 2013-05-10 ENCOUNTER — Encounter (HOSPITAL_COMMUNITY)
Admission: RE | Disposition: A | Discharge status: Home / Self Care | Payer: Self-pay | Source: Ambulatory Visit | Attending: Cardiology

## 2013-05-10 ENCOUNTER — Encounter (HOSPITAL_COMMUNITY): Payer: Self-pay | Admitting: Anesthesiology

## 2013-05-10 DIAGNOSIS — I2510 Atherosclerotic heart disease of native coronary artery without angina pectoris: Secondary | ICD-10-CM | POA: Insufficient documentation

## 2013-05-10 DIAGNOSIS — E039 Hypothyroidism, unspecified: Secondary | ICD-10-CM | POA: Insufficient documentation

## 2013-05-10 DIAGNOSIS — J449 Chronic obstructive pulmonary disease, unspecified: Secondary | ICD-10-CM | POA: Insufficient documentation

## 2013-05-10 DIAGNOSIS — Z8673 Personal history of transient ischemic attack (TIA), and cerebral infarction without residual deficits: Secondary | ICD-10-CM | POA: Insufficient documentation

## 2013-05-10 DIAGNOSIS — J4489 Other specified chronic obstructive pulmonary disease: Secondary | ICD-10-CM | POA: Insufficient documentation

## 2013-05-10 DIAGNOSIS — Z85118 Personal history of other malignant neoplasm of bronchus and lung: Secondary | ICD-10-CM | POA: Insufficient documentation

## 2013-05-10 DIAGNOSIS — I10 Essential (primary) hypertension: Secondary | ICD-10-CM | POA: Insufficient documentation

## 2013-05-10 DIAGNOSIS — K219 Gastro-esophageal reflux disease without esophagitis: Secondary | ICD-10-CM | POA: Insufficient documentation

## 2013-05-10 DIAGNOSIS — I4891 Unspecified atrial fibrillation: Secondary | ICD-10-CM | POA: Insufficient documentation

## 2013-05-10 DIAGNOSIS — E119 Type 2 diabetes mellitus without complications: Secondary | ICD-10-CM | POA: Insufficient documentation

## 2013-05-10 HISTORY — PX: CARDIOVERSION: SHX1299

## 2013-05-10 SURGERY — CARDIOVERSION
Anesthesia: General

## 2013-05-10 MED ORDER — PROPOFOL 10 MG/ML IV BOLUS
INTRAVENOUS | Status: DC | PRN
  Administered 2013-05-10: 20 mg via INTRAVENOUS
  Administered 2013-05-10: 40 mg via INTRAVENOUS
  Administered 2013-05-10: 10 mg via INTRAVENOUS

## 2013-05-10 MED ORDER — SODIUM CHLORIDE 0.9 % IV SOLN
INTRAVENOUS | Status: DC
  Administered 2013-05-10: 11:00:00 via INTRAVENOUS

## 2013-05-10 MED ORDER — LIDOCAINE HCL (CARDIAC) 20 MG/ML IV SOLN
INTRAVENOUS | Status: DC | PRN
  Administered 2013-05-10: 20 mg via INTRAVENOUS

## 2013-05-10 NOTE — CV Procedure (Signed)
Direct current cardioversion:  Indication symptomatic A. Fibrillation.  Procedure: Using 70 mg of IV Propofol for achieving deep (Moderate sedation), synchronized direct current cardioversion performed. Patient was delivered with 120, 150 Joules of electricity X 2 with success to NSR. Patient tolerated the procedure well. No immediate complication noted.

## 2013-05-10 NOTE — Anesthesia Preprocedure Evaluation (Signed)
Anesthesia Evaluation  Patient identified by MRN, date of birth, ID band Patient awake    Reviewed: Allergy & Precautions, H&P , NPO status , Patient's Chart, lab work & pertinent test results, reviewed documented beta blocker date and time   Airway Mallampati: II TM Distance: >3 FB Neck ROM: Full    Dental  (+) Teeth Intact and Dental Advisory Given   Pulmonary shortness of breath, COPD Lung CA X 9 years  Stable no oxygen  breath sounds clear to auscultation  Pulmonary exam normal       Cardiovascular hypertension, Pt. on medications and Pt. on home beta blockers + CAD Rhythm:Irregular Rate:Abnormal  Stress test negative   Neuro/Psych CVA    GI/Hepatic Neg liver ROS, GERD-  Poorly Controlled,  Endo/Other  diabetesHypothyroidism   Renal/GU negative Renal ROS     Musculoskeletal negative musculoskeletal ROS (+)   Abdominal Normal abdominal exam  (+)   Peds  Hematology negative hematology ROS (+)   Anesthesia Other Findings   Reproductive/Obstetrics                           Anesthesia Physical Anesthesia Plan  ASA: III  Anesthesia Plan: General   Post-op Pain Management:    Induction: Intravenous  Airway Management Planned: Mask  Additional Equipment:   Intra-op Plan:   Post-operative Plan:   Informed Consent: I have reviewed the patients History and Physical, chart, labs and discussed the procedure including the risks, benefits and alternatives for the proposed anesthesia with the patient or authorized representative who has indicated his/her understanding and acceptance.     Plan Discussed with: CRNA and Surgeon  Anesthesia Plan Comments:         Anesthesia Quick Evaluation

## 2013-05-10 NOTE — Transfer of Care (Signed)
Immediate Anesthesia Transfer of Care Note  Patient: [name]  Procedure(s) Performed: Procedure(s): CARDIOVERSION (N/A)  Patient Location: Endoscopy Unit  Anesthesia Type:General  Level of Consciousness: awake, alert  and oriented  Airway & Oxygen Therapy: Patient Spontanous Breathing and Patient connected to nasal cannula oxygen  Post-op Assessment: Report given to PACU RN and Post -op Vital signs reviewed and stable  Post vital signs: Reviewed and stable  Complications: No apparent anesthesia complications

## 2013-05-10 NOTE — Interval H&P Note (Signed)
History and Physical Interval Note:  05/10/2013 11:57 AM  [name]  has presented today for surgery, with the diagnosis of a-fib  The various methods of treatment have been discussed with the patient and family. After consideration of risks, benefits and other options for treatment, the patient has consented to  Procedure(s): CARDIOVERSION (N/A) as a surgical intervention .  The patient's history has been reviewed, patient examined, no change in status, stable for surgery.  I have reviewed the patient's chart and labs.  Questions were answered to the patient's satisfaction.     Pamella Pert

## 2013-05-10 NOTE — H&P (Signed)
  Please see office visit notes for complete details of HPI.  

## 2013-05-10 NOTE — Anesthesia Postprocedure Evaluation (Signed)
  Anesthesia Post-op Note  Patient: [name]  Procedure(s) Performed: Procedure(s): CARDIOVERSION (N/A)  Patient Location: Endoscopy Unit  Anesthesia Type:General  Level of Consciousness: awake, alert  and oriented  Airway and Oxygen Therapy: Patient Spontanous Breathing and Patient connected to nasal cannula oxygen  Post-op Pain: none  Post-op Assessment: Post-op Vital signs reviewed, Patient's Cardiovascular Status Stable, Respiratory Function Stable, Patent Airway and No signs of Nausea or vomiting  Post-op Vital Signs: Reviewed and stable  Complications: No apparent anesthesia complications

## 2013-05-11 ENCOUNTER — Encounter (HOSPITAL_COMMUNITY): Payer: Self-pay | Admitting: Cardiology

## 2013-07-14 ENCOUNTER — Other Ambulatory Visit: Payer: Self-pay

## 2013-07-14 ENCOUNTER — Emergency Department (HOSPITAL_COMMUNITY)
Admission: EM | Admit: 2013-07-14 | Discharge: 2013-07-14 | Disposition: A | Discharge status: Home / Self Care | Payer: Medicare Other | Attending: Emergency Medicine | Admitting: Emergency Medicine

## 2013-07-14 ENCOUNTER — Emergency Department (HOSPITAL_COMMUNITY): Payer: Medicare Other

## 2013-07-14 ENCOUNTER — Encounter (HOSPITAL_COMMUNITY): Payer: Self-pay | Admitting: *Deleted

## 2013-07-14 DIAGNOSIS — R296 Repeated falls: Secondary | ICD-10-CM | POA: Insufficient documentation

## 2013-07-14 DIAGNOSIS — Z7901 Long term (current) use of anticoagulants: Secondary | ICD-10-CM | POA: Insufficient documentation

## 2013-07-14 DIAGNOSIS — S99919A Unspecified injury of unspecified ankle, initial encounter: Secondary | ICD-10-CM | POA: Insufficient documentation

## 2013-07-14 DIAGNOSIS — Y9389 Activity, other specified: Secondary | ICD-10-CM | POA: Insufficient documentation

## 2013-07-14 DIAGNOSIS — I10 Essential (primary) hypertension: Secondary | ICD-10-CM | POA: Insufficient documentation

## 2013-07-14 DIAGNOSIS — Z79899 Other long term (current) drug therapy: Secondary | ICD-10-CM | POA: Insufficient documentation

## 2013-07-14 DIAGNOSIS — I4891 Unspecified atrial fibrillation: Secondary | ICD-10-CM | POA: Insufficient documentation

## 2013-07-14 DIAGNOSIS — S8990XA Unspecified injury of unspecified lower leg, initial encounter: Secondary | ICD-10-CM | POA: Insufficient documentation

## 2013-07-14 DIAGNOSIS — Z9889 Other specified postprocedural states: Secondary | ICD-10-CM | POA: Insufficient documentation

## 2013-07-14 DIAGNOSIS — Z8673 Personal history of transient ischemic attack (TIA), and cerebral infarction without residual deficits: Secondary | ICD-10-CM | POA: Insufficient documentation

## 2013-07-14 DIAGNOSIS — Z85118 Personal history of other malignant neoplasm of bronchus and lung: Secondary | ICD-10-CM | POA: Insufficient documentation

## 2013-07-14 DIAGNOSIS — M25562 Pain in left knee: Secondary | ICD-10-CM

## 2013-07-14 DIAGNOSIS — Y9289 Other specified places as the place of occurrence of the external cause: Secondary | ICD-10-CM | POA: Insufficient documentation

## 2013-07-14 DIAGNOSIS — R000 Tachycardia, unspecified: Secondary | ICD-10-CM | POA: Insufficient documentation

## 2013-07-14 DIAGNOSIS — S8992XA Unspecified injury of left lower leg, initial encounter: Secondary | ICD-10-CM

## 2013-07-14 DIAGNOSIS — E039 Hypothyroidism, unspecified: Secondary | ICD-10-CM | POA: Insufficient documentation

## 2013-07-14 DIAGNOSIS — E119 Type 2 diabetes mellitus without complications: Secondary | ICD-10-CM | POA: Insufficient documentation

## 2013-07-14 DIAGNOSIS — I2510 Atherosclerotic heart disease of native coronary artery without angina pectoris: Secondary | ICD-10-CM | POA: Insufficient documentation

## 2013-07-14 MED ORDER — TRAMADOL HCL 50 MG PO TABS: 50.0000 mg | ORAL_TABLET | Freq: Four times a day (QID) | ORAL | Status: DC | PRN

## 2013-07-14 MED ORDER — TRAMADOL HCL 50 MG PO TABS
50.0000 mg | ORAL_TABLET | Freq: Once | ORAL | Status: AC
  Administered 2013-07-14: 50 mg via ORAL
  Filled 2013-07-14: qty 1

## 2013-07-14 NOTE — ED Provider Notes (Signed)
History    CSN: [id number] Arrival date & time 07/14/13  2038  First MD Initiated Contact with Patient 07/14/13 2130     Chief Complaint  Patient presents with  . Fall   HPI  History provided by the patient and significant other. Patient is a 79 year old female with history of hypertension, diabetes, CAD, lung cancer , A. fib who presents with a fall and left knee pain. Patient was outside and trying to start her lawnmower and she pulled in fell backwards onto the dirt. Patient did not have any LOC and denies any immediate discomfort or injury however when she began to stand up she felt popping and pain in her left knee. She sat back down and rested a while rubbing her knee and was later able to stand and walk back into the house. Patient got into a warm bath and after getting out of the warm bath had increased pain and slight swelling to her knee. She used an Ace wrap to wrap her knee and has had some continued pain and swelling. Patient also took 2 Tylenol for her pain but has not had significant improvement. She denies any other injuries or complaints.    Past Medical History  Diagnosis Date  . Lung cancer   . Stroke   . Diabetes mellitus   . CAD (coronary artery disease) 03/13/2012  . Type II or unspecified type diabetes mellitus without mention of complication, not stated as uncontrolled 03/13/2012  . Hypothyroid 03/13/2012  . HTN (hypertension) 03/13/2012  . Dysrhythmia     ATRIAL FIB  . Shortness of breath    Past Surgical History  Procedure Laterality Date  . Cardioversion  04/20/2012    Procedure: CARDIOVERSION;  Surgeon: Pamella Pert, MD;  Location: [facility] OR;  Service: Cardiovascular;  Laterality: N/A;  . Abdominal hysterectomy    . Tumors      back of head  . Hemorrhoid surgery    . Jaw replacement    . Cholecystectomy    . Breast surgery      benign tumors removed in 1980  . Cardiac catheterization    . Cardioversion N/A 05/10/2013    Procedure: CARDIOVERSION;   Surgeon: Pamella Pert, MD;  Location: [facility] ENDOSCOPY;  Service: Cardiovascular;  Laterality: N/A;   History reviewed. No pertinent family history. History  Substance Use Topics  . Smoking status: Never Smoker   . Smokeless tobacco: Never Used  . Alcohol Use: No   OB History   Grav Para Term Preterm Abortions TAB SAB Ect Mult Living                 Review of Systems  Respiratory: Negative for shortness of breath.   Musculoskeletal:       Knee pain  All other systems reviewed and are negative.    Allergies  Codeine and Hydrocodone-acetaminophen  Home Medications   Current Outpatient Rx  Name  Route  Sig  Dispense  Refill  . acetaminophen (TYLENOL) 500 MG tablet   Oral   Take 1,000 mg by mouth every 6 (six) hours as needed for pain.          Marland Kitchen atorvastatin (LIPITOR) 10 MG tablet   Oral   Take 10 mg by mouth daily.         . Calcium Carb-Cholecalciferol (CALCIUM-VITAMIN D3) 600-500 MG-UNIT CAPS   Oral   Take 1 capsule by mouth daily.         . colesevelam [facility]) 625  MG tablet   Oral   Take 625 mg by mouth daily as needed.          . diltiazem (CARDIZEM CD) 180 MG 24 hr capsule   Oral   Take 180 mg by mouth 2 (two) times daily.         Marland Kitchen levothyroxine (SYNTHROID, LEVOTHROID) 150 MCG tablet   Oral   Take 150 mcg by mouth daily.         . Mag Oxide-Vit D3-Turmeric (MAGNESIUM-VITAMIN D3-TURMERIC PO)   Oral   Take 1 tablet by mouth daily.         . meclizine (ANTIVERT) 25 MG tablet   Oral   Take 25 mg by mouth daily as needed for dizziness.          . metFORMIN (GLUMETZA) 500 MG (MOD) 24 hr tablet   Oral   Take 500 mg by mouth daily with breakfast.         . Multiple Vitamins-Minerals (WOMENS BONE HEALTH PO)   Oral   Take 1 tablet by mouth daily.         . polyvinyl alcohol (LIQUIFILM TEARS) 1.4 % ophthalmic solution   Both Eyes   Place 2 drops into both eyes as needed.         Marland Kitchen PRESCRIPTION MEDICATION   Both Eyes   Place  2 drops into both eyes daily as needed (for dry eyes).         . Rivaroxaban (XARELTO) 15 MG TABS tablet   Oral   Take 15 mg by mouth daily with supper.         . sotalol (BETAPACE) 80 MG tablet   Oral   Take 80 mg by mouth 2 (two) times daily.         . valsartan (DIOVAN) 320 MG tablet   Oral   Take 320 mg by mouth daily.          BP 121/98  Pulse 134  Temp(Src) 98 F (36.7 C) (Oral)  Resp 18  SpO2 97% Physical Exam  Nursing note and vitals reviewed. Constitutional: She is oriented to person, place, and time. She appears well-developed and well-nourished. No distress.  HENT:  Head: Normocephalic and atraumatic.  Neck: Normal range of motion. Neck supple.  No cervical midline tenderness  Cardiovascular: An irregular rhythm present. Tachycardia present.   Pulmonary/Chest: Effort normal and breath sounds normal. No respiratory distress. She has no wheezes. She has no rales.  Abdominal: Soft.  Musculoskeletal: She exhibits edema and tenderness.  Slightly reduced range of motion of left knee secondary to pain. There is mild swelling to the anterior inferior aspect with tenderness. No gross deformities. Normal distal dorsal pedal pulses. Normal strength in feet. Patient reports baseline slight decreased sensation to foot. No other change in sensation to light touch.  Hip and pelvis stable and nontender.  Neurological: She is alert and oriented to person, place, and time.  Skin: Skin is warm and dry. No rash noted.  Psychiatric: She has a normal mood and affect. Her behavior is normal.    ED Course  Procedures   Dg Knee Complete 4 Views Left  07/14/2013   *RADIOLOGY REPORT*  Clinical Data: Status post fall; left knee pain.  LEFT KNEE - COMPLETE 4+ VIEW  Comparison: None.  Findings: There is no evidence of fracture or dislocation.  There is mild narrowing of the medial compartment; underlying chondrocalcinosis is noted.  The patellofemoral joint is grossly unremarkable in  appearance.  A small knee joint effusion is seen.  The visualized soft tissues are otherwise unremarkable in appearance.  IMPRESSION:  1.  No evidence of fracture or dislocation. 2.  Small knee joint effusion noted. 3.  Mild narrowing of the medial compartment; underlying chondrocalcinosis seen.   Original Report Authenticated By: Tonia Ghent, M.D.   1. Knee pain, acute, left   2. Knee injury, left, initial encounter     MDM  Patient seen and evaluated. Patient appears well in no acute distress. She has an Ace wrap applied to her left knee. There is mild swelling of the anterior inferior aspect with tenderness. No gross deformities. Able to bear weight.  X-rays without signs of fracture. This time suspect minor knee sprain. We'll advise for continued rest, ice, compression elevation.  Heart rate has now improved. He continues to be irregular in A. fib which patient has chronically. Rate bounces between 96 and the low 100s. Patient is feeling well and we have discussed x-ray findings. She has a walker at home which she will use for assistance. She has been instructed on treatment and agrees with plan.    Date: 07/14/2013  Rate: 139  Rhythm: atrial fibrillation and ventricular tachycardia  QRS Axis: normal  Intervals: normal  ST/T Wave abnormalities: nonspecific ST/T changes  Conduction Disutrbances:none  Narrative Interpretation:   Old EKG Reviewed: No significant changes. Rate is faster    Angus Seller, PA-C 07/15/13 [phone]

## 2013-07-14 NOTE — ED Notes (Addendum)
Pt states was trying to start the lawn mower and it backfired and she fell.  she was mowing the lawn and she fell she landed on her right elbow and her left knee, left knee is painful. Pt denies LOC, light-headness, dizziness. Pt took OTC pain tylenol.

## 2013-07-17 NOTE — ED Provider Notes (Signed)
Medical screening examination/treatment/procedure(s) were performed by non-physician practitioner and as supervising physician I was immediately available for consultation/collaboration.  Cesar Alf T Waino Mounsey, MD 07/17/13 0705 

## 2013-11-08 ENCOUNTER — Other Ambulatory Visit: Payer: Self-pay

## 2013-11-08 DIAGNOSIS — Z1231 Encounter for screening mammogram for malignant neoplasm of breast: Secondary | ICD-10-CM

## 2013-12-07 ENCOUNTER — Ambulatory Visit: Payer: Medicare Other

## 2013-12-09 ENCOUNTER — Ambulatory Visit
Admission: RE | Admit: 2013-12-09 | Discharge: 2013-12-09 | Disposition: A | Discharge status: Home / Self Care | Payer: Medicare Other | Source: Ambulatory Visit

## 2013-12-09 ENCOUNTER — Other Ambulatory Visit: Payer: Self-pay

## 2013-12-09 ENCOUNTER — Inpatient Hospital Stay: Admission: RE | Admit: 2013-12-09 | Payer: Medicare Other | Source: Ambulatory Visit

## 2013-12-09 DIAGNOSIS — Z1231 Encounter for screening mammogram for malignant neoplasm of breast: Secondary | ICD-10-CM

## 2014-05-29 ENCOUNTER — Other Ambulatory Visit: Payer: Self-pay | Admitting: Gastroenterology

## 2014-05-29 DIAGNOSIS — R1310 Dysphagia, unspecified: Secondary | ICD-10-CM

## 2014-06-07 ENCOUNTER — Ambulatory Visit
Admission: RE | Admit: 2014-06-07 | Discharge: 2014-06-07 | Disposition: A | Discharge status: Home / Self Care | Payer: Medicare Other | Source: Ambulatory Visit | Attending: Gastroenterology | Admitting: Gastroenterology

## 2014-06-07 DIAGNOSIS — R1310 Dysphagia, unspecified: Secondary | ICD-10-CM

## 2014-06-28 ENCOUNTER — Other Ambulatory Visit: Payer: Self-pay | Admitting: Gastroenterology

## 2014-11-02 ENCOUNTER — Other Ambulatory Visit: Payer: Self-pay

## 2014-11-02 DIAGNOSIS — Z1231 Encounter for screening mammogram for malignant neoplasm of breast: Secondary | ICD-10-CM

## 2015-01-02 DIAGNOSIS — R532 Functional quadriplegia: Secondary | ICD-10-CM | POA: Diagnosis not present

## 2015-01-02 DIAGNOSIS — I482 Chronic atrial fibrillation: Secondary | ICD-10-CM | POA: Diagnosis not present

## 2015-01-03 ENCOUNTER — Ambulatory Visit
Admission: RE | Admit: 2015-01-03 | Discharge: 2015-01-03 | Disposition: A | Discharge status: Home / Self Care | Payer: 59 | Source: Ambulatory Visit

## 2015-01-03 DIAGNOSIS — Z1231 Encounter for screening mammogram for malignant neoplasm of breast: Secondary | ICD-10-CM | POA: Diagnosis not present

## 2015-01-15 DIAGNOSIS — E559 Vitamin D deficiency, unspecified: Secondary | ICD-10-CM | POA: Diagnosis not present

## 2015-02-02 DIAGNOSIS — R532 Functional quadriplegia: Secondary | ICD-10-CM | POA: Diagnosis not present

## 2015-02-02 DIAGNOSIS — I482 Chronic atrial fibrillation: Secondary | ICD-10-CM | POA: Diagnosis not present

## 2015-02-16 DIAGNOSIS — E119 Type 2 diabetes mellitus without complications: Secondary | ICD-10-CM | POA: Diagnosis not present

## 2015-02-16 DIAGNOSIS — E789 Disorder of lipoprotein metabolism, unspecified: Secondary | ICD-10-CM | POA: Diagnosis not present

## 2015-02-16 DIAGNOSIS — E559 Vitamin D deficiency, unspecified: Secondary | ICD-10-CM | POA: Diagnosis not present

## 2015-02-21 DIAGNOSIS — E118 Type 2 diabetes mellitus with unspecified complications: Secondary | ICD-10-CM | POA: Diagnosis not present

## 2015-02-21 DIAGNOSIS — I10 Essential (primary) hypertension: Secondary | ICD-10-CM | POA: Diagnosis not present

## 2015-03-03 DIAGNOSIS — R532 Functional quadriplegia: Secondary | ICD-10-CM | POA: Diagnosis not present

## 2015-03-03 DIAGNOSIS — I482 Chronic atrial fibrillation: Secondary | ICD-10-CM | POA: Diagnosis not present

## 2015-03-22 DIAGNOSIS — E118 Type 2 diabetes mellitus with unspecified complications: Secondary | ICD-10-CM | POA: Diagnosis not present

## 2015-03-22 DIAGNOSIS — E538 Deficiency of other specified B group vitamins: Secondary | ICD-10-CM | POA: Diagnosis not present

## 2015-03-29 DIAGNOSIS — E039 Hypothyroidism, unspecified: Secondary | ICD-10-CM | POA: Diagnosis not present

## 2015-03-29 DIAGNOSIS — R079 Chest pain, unspecified: Secondary | ICD-10-CM | POA: Diagnosis not present

## 2015-03-29 DIAGNOSIS — I2510 Atherosclerotic heart disease of native coronary artery without angina pectoris: Secondary | ICD-10-CM | POA: Diagnosis not present

## 2015-03-29 DIAGNOSIS — E789 Disorder of lipoprotein metabolism, unspecified: Secondary | ICD-10-CM | POA: Diagnosis not present

## 2015-04-03 DIAGNOSIS — R532 Functional quadriplegia: Secondary | ICD-10-CM | POA: Diagnosis not present

## 2015-04-03 DIAGNOSIS — I482 Chronic atrial fibrillation: Secondary | ICD-10-CM | POA: Diagnosis not present

## 2015-04-05 DIAGNOSIS — R0609 Other forms of dyspnea: Secondary | ICD-10-CM | POA: Diagnosis not present

## 2015-04-05 DIAGNOSIS — R079 Chest pain, unspecified: Secondary | ICD-10-CM | POA: Diagnosis not present

## 2015-04-05 DIAGNOSIS — M79604 Pain in right leg: Secondary | ICD-10-CM | POA: Diagnosis not present

## 2015-04-05 DIAGNOSIS — I482 Chronic atrial fibrillation: Secondary | ICD-10-CM | POA: Diagnosis not present

## 2015-04-05 DIAGNOSIS — M79605 Pain in left leg: Secondary | ICD-10-CM | POA: Diagnosis not present

## 2015-04-17 ENCOUNTER — Emergency Department (HOSPITAL_COMMUNITY): Payer: Medicare Other

## 2015-04-17 ENCOUNTER — Encounter (HOSPITAL_COMMUNITY): Payer: Self-pay | Admitting: *Deleted

## 2015-04-17 ENCOUNTER — Observation Stay (HOSPITAL_COMMUNITY)
Admission: EM | Admit: 2015-04-17 | Discharge: 2015-04-20 | Disposition: A | Discharge status: Home / Self Care | Payer: Medicare Other | Attending: Internal Medicine | Admitting: Internal Medicine

## 2015-04-17 DIAGNOSIS — E785 Hyperlipidemia, unspecified: Secondary | ICD-10-CM | POA: Diagnosis not present

## 2015-04-17 DIAGNOSIS — R0600 Dyspnea, unspecified: Secondary | ICD-10-CM | POA: Diagnosis present

## 2015-04-17 DIAGNOSIS — I481 Persistent atrial fibrillation: Secondary | ICD-10-CM | POA: Diagnosis not present

## 2015-04-17 DIAGNOSIS — D696 Thrombocytopenia, unspecified: Secondary | ICD-10-CM | POA: Diagnosis not present

## 2015-04-17 DIAGNOSIS — N179 Acute kidney failure, unspecified: Secondary | ICD-10-CM | POA: Diagnosis not present

## 2015-04-17 DIAGNOSIS — I2510 Atherosclerotic heart disease of native coronary artery without angina pectoris: Principal | ICD-10-CM | POA: Diagnosis present

## 2015-04-17 DIAGNOSIS — I4891 Unspecified atrial fibrillation: Secondary | ICD-10-CM | POA: Diagnosis present

## 2015-04-17 DIAGNOSIS — I639 Cerebral infarction, unspecified: Secondary | ICD-10-CM | POA: Diagnosis present

## 2015-04-17 DIAGNOSIS — D3A090 Benign carcinoid tumor of the bronchus and lung: Secondary | ICD-10-CM | POA: Diagnosis present

## 2015-04-17 DIAGNOSIS — IMO0002 Reserved for concepts with insufficient information to code with codable children: Secondary | ICD-10-CM | POA: Insufficient documentation

## 2015-04-17 DIAGNOSIS — E119 Type 2 diabetes mellitus without complications: Secondary | ICD-10-CM | POA: Insufficient documentation

## 2015-04-17 DIAGNOSIS — E039 Hypothyroidism, unspecified: Secondary | ICD-10-CM | POA: Diagnosis not present

## 2015-04-17 DIAGNOSIS — R5381 Other malaise: Secondary | ICD-10-CM | POA: Diagnosis not present

## 2015-04-17 DIAGNOSIS — I2584 Coronary atherosclerosis due to calcified coronary lesion: Secondary | ICD-10-CM | POA: Insufficient documentation

## 2015-04-17 DIAGNOSIS — Z794 Long term (current) use of insulin: Secondary | ICD-10-CM | POA: Insufficient documentation

## 2015-04-17 DIAGNOSIS — R0602 Shortness of breath: Secondary | ICD-10-CM | POA: Diagnosis present

## 2015-04-17 DIAGNOSIS — N183 Chronic kidney disease, stage 3 unspecified: Secondary | ICD-10-CM | POA: Diagnosis present

## 2015-04-17 DIAGNOSIS — I350 Nonrheumatic aortic (valve) stenosis: Secondary | ICD-10-CM | POA: Insufficient documentation

## 2015-04-17 DIAGNOSIS — I10 Essential (primary) hypertension: Secondary | ICD-10-CM | POA: Diagnosis present

## 2015-04-17 DIAGNOSIS — I129 Hypertensive chronic kidney disease with stage 1 through stage 4 chronic kidney disease, or unspecified chronic kidney disease: Secondary | ICD-10-CM | POA: Insufficient documentation

## 2015-04-17 DIAGNOSIS — E876 Hypokalemia: Secondary | ICD-10-CM | POA: Diagnosis present

## 2015-04-17 DIAGNOSIS — Z8673 Personal history of transient ischemic attack (TIA), and cerebral infarction without residual deficits: Secondary | ICD-10-CM | POA: Diagnosis not present

## 2015-04-17 DIAGNOSIS — Z7901 Long term (current) use of anticoagulants: Secondary | ICD-10-CM | POA: Diagnosis not present

## 2015-04-17 DIAGNOSIS — I272 Other secondary pulmonary hypertension: Secondary | ICD-10-CM | POA: Diagnosis not present

## 2015-04-17 DIAGNOSIS — C3491 Malignant neoplasm of unspecified part of right bronchus or lung: Secondary | ICD-10-CM

## 2015-04-17 DIAGNOSIS — D638 Anemia in other chronic diseases classified elsewhere: Secondary | ICD-10-CM | POA: Diagnosis not present

## 2015-04-17 DIAGNOSIS — C3490 Malignant neoplasm of unspecified part of unspecified bronchus or lung: Secondary | ICD-10-CM

## 2015-04-17 DIAGNOSIS — E1165 Type 2 diabetes mellitus with hyperglycemia: Secondary | ICD-10-CM | POA: Insufficient documentation

## 2015-04-17 DIAGNOSIS — I482 Chronic atrial fibrillation: Secondary | ICD-10-CM | POA: Diagnosis not present

## 2015-04-17 DIAGNOSIS — J449 Chronic obstructive pulmonary disease, unspecified: Secondary | ICD-10-CM | POA: Diagnosis not present

## 2015-04-17 DIAGNOSIS — E1129 Type 2 diabetes mellitus with other diabetic kidney complication: Secondary | ICD-10-CM | POA: Insufficient documentation

## 2015-04-17 LAB — URINALYSIS, ROUTINE W REFLEX MICROSCOPIC
Glucose, UA: NEGATIVE mg/dL
Hgb urine dipstick: NEGATIVE
KETONES UR: 15 mg/dL — AB
Nitrite: NEGATIVE
Protein, ur: NEGATIVE mg/dL
Specific Gravity, Urine: 1.023 (ref 1.005–1.030)
Urobilinogen, UA: 1 mg/dL (ref 0.0–1.0)
pH: 5 (ref 5.0–8.0)

## 2015-04-17 LAB — BASIC METABOLIC PANEL
Anion gap: 10 (ref 5–15)
BUN: 17 mg/dL (ref 6–23)
CO2: 23 mmol/L (ref 19–32)
Calcium: 8.5 mg/dL (ref 8.4–10.5)
Chloride: 102 mmol/L (ref 96–112)
Creatinine, Ser: 1.46 mg/dL — ABNORMAL HIGH (ref 0.50–1.10)
GFR calc Af Amer: 39 mL/min — ABNORMAL LOW (ref >90)
GFR, EST NON AFRICAN AMERICAN: 33 mL/min — AB (ref >90)
Glucose, Bld: 338 mg/dL — ABNORMAL HIGH (ref 70–99)
POTASSIUM: 3.4 mmol/L — AB (ref 3.5–5.1)
SODIUM: 135 mmol/L (ref 135–145)

## 2015-04-17 LAB — I-STAT TROPONIN, ED: TROPONIN I, POC: 0 ng/mL (ref 0.00–0.08)

## 2015-04-17 LAB — URINE MICROSCOPIC-ADD ON

## 2015-04-17 LAB — CBC
HCT: 31.2 % — ABNORMAL LOW (ref 36.0–46.0)
Hemoglobin: 10.6 g/dL — ABNORMAL LOW (ref 12.0–15.0)
MCH: 29.2 pg (ref 26.0–34.0)
MCHC: 34 g/dL (ref 30.0–36.0)
MCV: 86 fL (ref 78.0–100.0)
PLATELETS: 151 10*3/uL (ref 150–400)
RBC: 3.63 MIL/uL — ABNORMAL LOW (ref 3.87–5.11)
RDW: 13.4 % (ref 11.5–15.5)
WBC: 7.7 10*3/uL (ref 4.0–10.5)

## 2015-04-17 LAB — D-DIMER, QUANTITATIVE (NOT AT ARMC): D DIMER QUANT: 1.13 ug{FEU}/mL — AB (ref 0.00–0.48)

## 2015-04-17 LAB — BRAIN NATRIURETIC PEPTIDE: B Natriuretic Peptide: 302 pg/mL — ABNORMAL HIGH (ref 0.0–100.0)

## 2015-04-17 MED ORDER — POTASSIUM CHLORIDE CRYS ER 20 MEQ PO TBCR
20.0000 meq | EXTENDED_RELEASE_TABLET | Freq: Once | ORAL | Status: AC
  Administered 2015-04-18: 20 meq via ORAL
  Filled 2015-04-17: qty 1

## 2015-04-17 MED ORDER — ALBUTEROL SULFATE (2.5 MG/3ML) 0.083% IN NEBU: 2.5000 mg | INHALATION_SOLUTION | RESPIRATORY_TRACT | Status: DC | PRN

## 2015-04-17 MED ORDER — DILTIAZEM HCL ER COATED BEADS 180 MG PO CP24
180.0000 mg | ORAL_CAPSULE | Freq: Two times a day (BID) | ORAL | Status: DC
  Administered 2015-04-18 – 2015-04-20 (×6): 180 mg via ORAL
  Filled 2015-04-17 (×9): qty 1

## 2015-04-17 MED ORDER — ACETAMINOPHEN 325 MG PO TABS: 650.0000 mg | ORAL_TABLET | Freq: Four times a day (QID) | ORAL | Status: DC | PRN

## 2015-04-17 MED ORDER — SOTALOL HCL 80 MG PO TABS
80.0000 mg | ORAL_TABLET | Freq: Two times a day (BID) | ORAL | Status: DC
  Administered 2015-04-18 – 2015-04-20 (×6): 80 mg via ORAL
  Filled 2015-04-17 (×7): qty 1

## 2015-04-17 MED ORDER — CALCIUM-VITAMIN D3 600-500 MG-UNIT PO CAPS: 1.0000 | ORAL_CAPSULE | Freq: Every day | ORAL | Status: DC

## 2015-04-17 MED ORDER — ONDANSETRON HCL 4 MG/2ML IJ SOLN: 4.0000 mg | Freq: Four times a day (QID) | INTRAMUSCULAR | Status: DC | PRN

## 2015-04-17 MED ORDER — MAGNESIUM-VITAMIN D3-TURMERIC 500-3000-150 MG-UNIT-MG PO CAPS: 1.0000 | ORAL_CAPSULE | Freq: Every day | ORAL | Status: DC

## 2015-04-17 MED ORDER — RIVAROXABAN 15 MG PO TABS
15.0000 mg | ORAL_TABLET | Freq: Every day | ORAL | Status: DC
  Administered 2015-04-18 – 2015-04-20 (×4): 15 mg via ORAL
  Filled 2015-04-17 (×4): qty 1

## 2015-04-17 MED ORDER — HYDRALAZINE HCL 20 MG/ML IJ SOLN: 5.0000 mg | INTRAMUSCULAR | Status: DC | PRN

## 2015-04-17 MED ORDER — SODIUM CHLORIDE 0.9 % IV SOLN
INTRAVENOUS | Status: DC
  Administered 2015-04-18: via INTRAVENOUS

## 2015-04-17 MED ORDER — CALCIUM CARBONATE-VITAMIN D 500-200 MG-UNIT PO TABS
1.0000 | ORAL_TABLET | Freq: Every day | ORAL | Status: DC
  Administered 2015-04-18 – 2015-04-20 (×3): 1 via ORAL
  Filled 2015-04-17 (×4): qty 1

## 2015-04-17 MED ORDER — POLYETHYL GLYCOL-PROPYL GLYCOL 0.4-0.3 % OP SOLN: 2.0000 [drp] | Freq: Two times a day (BID) | OPHTHALMIC | Status: DC | PRN

## 2015-04-17 MED ORDER — ACETAMINOPHEN 500 MG PO TABS
500.0000 mg | ORAL_TABLET | Freq: Once | ORAL | Status: AC
  Administered 2015-04-17: 500 mg via ORAL
  Filled 2015-04-17: qty 1

## 2015-04-17 MED ORDER — ONDANSETRON HCL 4 MG PO TABS: 4.0000 mg | ORAL_TABLET | Freq: Four times a day (QID) | ORAL | Status: DC | PRN

## 2015-04-17 MED ORDER — ATORVASTATIN CALCIUM 10 MG PO TABS
10.0000 mg | ORAL_TABLET | Freq: Every day | ORAL | Status: DC
  Administered 2015-04-18: 10 mg via ORAL
  Filled 2015-04-17: qty 1

## 2015-04-17 MED ORDER — LEVOTHYROXINE SODIUM 150 MCG PO TABS
150.0000 ug | ORAL_TABLET | Freq: Every day | ORAL | Status: DC
  Administered 2015-04-18 – 2015-04-20 (×3): 150 ug via ORAL
  Filled 2015-04-17 (×4): qty 1

## 2015-04-17 MED ORDER — POLYVINYL ALCOHOL 1.4 % OP SOLN
2.0000 [drp] | OPHTHALMIC | Status: DC | PRN
  Filled 2015-04-17: qty 15

## 2015-04-17 MED ORDER — COLESEVELAM HCL 625 MG PO TABS
625.0000 mg | ORAL_TABLET | Freq: Every day | ORAL | Status: DC
  Administered 2015-04-18 – 2015-04-20 (×3): 625 mg via ORAL
  Filled 2015-04-17 (×3): qty 1

## 2015-04-17 MED ORDER — MECLIZINE HCL 25 MG PO TABS
25.0000 mg | ORAL_TABLET | Freq: Every day | ORAL | Status: DC | PRN
  Filled 2015-04-17: qty 1

## 2015-04-17 MED ORDER — SODIUM CHLORIDE 0.9 % IJ SOLN
3.0000 mL | Freq: Two times a day (BID) | INTRAMUSCULAR | Status: DC
  Administered 2015-04-18 – 2015-04-20 (×5): 3 mL via INTRAVENOUS

## 2015-04-17 MED ORDER — INSULIN ASPART 100 UNIT/ML ~~LOC~~ SOLN
0.0000 [IU] | Freq: Three times a day (TID) | SUBCUTANEOUS | Status: DC
  Administered 2015-04-18 – 2015-04-19 (×2): 5 [IU] via SUBCUTANEOUS
  Administered 2015-04-19 (×2): 3 [IU] via SUBCUTANEOUS
  Administered 2015-04-20 (×2): 2 [IU] via SUBCUTANEOUS

## 2015-04-17 NOTE — ED Notes (Signed)
Pt sates that she is having difficulty breathing. Worse with exertion. Pt has hx of lung cancer. denies chest pain.

## 2015-04-17 NOTE — H&P (Signed)
[facility] History and Physical  [name] SEG:[id number] DOB: [birth year] DOA: 04/17/2015  Referring physician: ED physician PCP: Dwan Bolt, MD  Specialists:   Chief Complaint: sob  HPI: [name] is a 81 y.o. 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  Patient reports that she has been having a worsening shortness of breath in the past 3 weeks. Her shortness of breath is intermittent. It happens almost every day. It last for various  time. It is aggravated by exertion. She coughs up small amount of greenish colored sputum. Sometimes the sputum is thick and dark in color. She occasionally saw blood in the sputum. Sometimes she has a sharp chest pain, which usually last for a few minutes and resolves spontaneously.  Currently patient does not have chest pain. No fever or chills. Patient reports that she has been compliant to her Xarelto, and did not miss any dose.She also reports having mild dysuria sometimes, no burning or increased urinary frequency.   ROS: currently patient denies fever, chills, fatigue, running nose, ear pain, headaches, abdominal pain, diarrhea, constipation, skin rashes or leg swelling. No unilateral weakness, numbness or tingling sensations. No vision change or hearing loss.  In ED, patient was found to have WBC 7.7, negative troponin, BNP 302, temperature normal, no tachycardia, AKI, potassium is 3.4. D-dimer 1.13. Patient is admitted to inpatient for further evaluation and treatment.  Review of Systems: As presented in the history of presenting illness, rest negative.  Where does patient live?  At home Can patient participate in ADLs? some  Allergy:   Allergies  Allergen Reactions  . Codeine Nausea And Vomiting  . Hydrocodone-Acetaminophen Nausea And Vomiting    Past Medical History  Diagnosis Date  . Lung cancer   . Stroke   . Diabetes mellitus   . CAD (coronary artery disease) 03/13/2012  . Type II or unspecified type diabetes mellitus without mention of complication, not stated as uncontrolled 03/13/2012  . Hypothyroid 03/13/2012  . HTN (hypertension) 03/13/2012  . Dysrhythmia     ATRIAL FIB  . Shortness of breath     Past Surgical History  Procedure Laterality Date  . Cardioversion  04/20/2012    Procedure: CARDIOVERSION;  Surgeon: Laverda Page, MD;  Location: [place];  Service: Cardiovascular;  Laterality: N/A;  . Abdominal hysterectomy    . Tumors      back of head  . Hemorrhoid surgery    . Jaw replacement    . Cholecystectomy    . Breast surgery      benign tumors removed in 1980  . Cardiac catheterization    . Cardioversion N/A 05/10/2013    Procedure: CARDIOVERSION;  Surgeon: Laverda Page, MD;  Location: [place];  Service: Cardiovascular;  Laterality: N/A;    Social History:  reports that she has never smoked. She has never used smokeless tobacco. She reports that she does not drink alcohol or use illicit drugs.  Family History:  Family History  Problem Relation Age of Onset  . Breast cancer Mother   . Aneurysm Father     Likely thoracic aneurysm per patient  . Melanoma Brother   . Heart disease Sister   . Diabetes type II Sister      Prior to Admission medications   Medication  Sig Start Date End Date Taking? Authorizing Provider  meclizine (ANTIVERT) 25 MG tablet Take 25 mg by mouth daily as needed for dizziness.    Yes Historical Provider, MD  valsartan (DIOVAN) 320 MG tablet Take 320 mg by mouth daily.   Yes Historical Provider, MD  acetaminophen (TYLENOL) 500 MG tablet Take 1,000 mg by mouth every 6 (six) hours as needed for pain.    Yes Historical Provider, MD  atorvastatin (LIPITOR)  10 MG tablet Take 10 mg by mouth daily.   Yes Historical Provider, MD  Calcium Carb-Cholecalciferol (CALCIUM-VITAMIN D3) 600-500 MG-UNIT CAPS Take 1 capsule by mouth daily.   Yes Historical Provider, MD  colesevelam (WELCHOL) 625 MG tablet Take 625 mg by mouth daily.    Yes Historical Provider, MD  diltiazem (CARDIZEM CD) 180 MG 24 hr capsule Take 180 mg by mouth 2 (two) times daily.   Yes Historical Provider, MD  levothyroxine (SYNTHROID, LEVOTHROID) 150 MCG tablet Take 150 mcg by mouth daily.   Yes Historical Provider, MD  Mag Oxide-Vit D3-Turmeric (MAGNESIUM-VITAMIN D3-TURMERIC PO) Take 1 tablet by mouth daily.   Yes Historical Provider, MD  metFORMIN (GLUMETZA) 500 MG (MOD) 24 hr tablet Take 500 mg by mouth daily with breakfast.   Yes Historical Provider, MD  Multiple Vitamins-Minerals (WOMENS BONE HEALTH PO) Take 1 tablet by mouth daily.   Yes Historical Provider, MD  Polyethyl Glycol-Propyl Glycol (SYSTANE) 0.4-0.3 % SOLN Apply 2 drops to eye 2 (two) times daily as needed (dry eyes).   Yes Historical Provider, MD  polyvinyl alcohol (LIQUIFILM TEARS) 1.4 % ophthalmic solution Place 2 drops into both eyes as needed for dry eyes.    Yes Historical Provider, MD  Rivaroxaban (XARELTO) 15 MG TABS tablet Take 15 mg by mouth daily with supper. 03/16/12  Yes Adrian Prows, MD  sotalol (BETAPACE) 80 MG tablet Take 80 mg by mouth 2 (two) times daily.   Yes Historical Provider, MD  traMADol (ULTRAM) 50 MG tablet Take 1 tablet (50 mg total) by mouth every 6 (six) hours as needed for pain. Patient not taking: Reported on 04/17/2015 07/14/13   Hazel Sams, PA-C    Physical Exam: Filed Vitals:   04/17/15 2115 04/17/15 2130 04/17/15 2202 04/17/15 2229  BP: 116/74 123/74 117/78 134/68  Pulse: 90 45 88 86  Temp:   97.8 F (36.6 C) 97.7 F (36.5 C)  TempSrc:   Oral Oral  Resp: '23 20 22 22  '$ SpO2: 96% 96% 95% 98%   General: Not in acute distress HEENT:       Eyes: PERRL, EOMI, no scleral icterus       ENT:  No discharge from the ears and nose, no pharynx injection, no tonsillar enlargement.        Neck: No JVD, no bruit, no mass felt. Cardiac: S1/S2, RRR, No murmurs, No gallops or rubs Pulm: Fine crackles bilaterally and posteriorly  Abd: Soft, nondistended, nontender, no rebound pain, no organomegaly, BS present Ext: No edema bilaterally. 2+DP/PT pulse bilaterally Musculoskeletal: No joint deformities, erythema, or stiffness, ROM full Skin: No rashes.  Neuro: Alert and oriented X3, cranial nerves II-XII grossly intact, muscle strength 5/5 in all extremeties, sensation to light touch intact.  Psych: Patient is not psychotic, no suicidal or hemocidal ideation.  Labs on Admission:  Basic Metabolic Panel:  Recent Labs Lab 04/17/15 1459  NA 135  K 3.4*  CL 102  CO2 23  GLUCOSE 338*  BUN 17  CREATININE 1.46*  CALCIUM 8.5  Liver Function Tests: No results for input(s): AST, ALT, ALKPHOS, BILITOT, PROT, ALBUMIN in the last 168 hours. No results for input(s): LIPASE, AMYLASE in the last 168 hours. No results for input(s): AMMONIA in the last 168 hours. CBC:  Recent Labs Lab 04/17/15 1459 04/18/15 0401  WBC 7.7 4.8  HGB 10.6* 10.2*  HCT 31.2* 29.4*  MCV 86.0 85.2  PLT 151 129*   Cardiac Enzymes: No results for input(s): CKTOTAL, CKMB, CKMBINDEX, TROPONINI in the last 168 hours.  BNP (last 3 results)  Recent Labs  04/17/15 1459  BNP 302.0*    ProBNP (last 3 results) No results for input(s): PROBNP in the last 8760 hours.  CBG: No results for input(s): GLUCAP in the last 168 hours.  Radiological Exams on Admission: Dg Chest 2 View  04/17/2015   CLINICAL DATA:  Shortness of breath and weakness. History of lung cancer.  EXAM: CHEST  2 VIEW  COMPARISON:  03/29/2015  FINDINGS: Heart size is mildly enlarged without evidence for edema or focal pulmonary opacity. Curvilinear left basilar scarring is noted. Mild hyperaeration suggesting COPD is stable. Remote left posterior  rib fractures are reidentified. No pleural effusion. No pneumothorax. Bones are subjectively osteopenic.  IMPRESSION: No active cardiopulmonary disease.   Electronically Signed   By: Conchita Paris M.D.   On: 04/17/2015 17:16    EKG: Independently reviewed.  Abnormal findings:  QTc interval 503, low voltage, atrial fibrillation   Assessment/Plan Principal Problem:   SOB (shortness of breath) Active Problems:   Lung cancer   Stroke   Atrial fibrillation   HTN (hypertension)   Hypothyroid   Diabetes mellitus without complication   CAD (coronary artery disease)   Acute renal failure superimposed on stage 3 chronic kidney disease   Hypokalemia  SOB: Patient does not have signs of infection, less likely to have pneumonia. Patient has been compliant to blood thinner for atrial fibrillation, less likely to have PE. BnP is 302, not have leg edema, less lately to have congestive heart failure. Possible etiology is worsening lung carcinoid. 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 She was followed by oncologsit at [facility]. Per Dr. Philbert Riser note on 02/07/13, pt has carcinoid/neuroendocrine tumor, which has been stable. She has an appointment with her oncologist in May or June per patient. Currently, she has worsening shortness of breath. It is possible that patient has worsening lung carcinoid tumor. -will admit to tele bed due to Afib -albuterol prn -Mucinex for cough -Get CT chest without contrast -prn nasal canula oxygen  AoCKD-III: Baseline creatinines is about 1.0. Her creatinine is 1.46 on admission, BUN 17.  -Check Fena and renal ultrasound -IV fluid to 75 mL per hour, normal saline -Hold valsartan   hypertension: -Continue Cardizem, sotalol -hold valsartan -IV prn hydralazine  Hypokalemia: -repleted  Hyperlipidemia: No LDL record  -continue Lipitor  Diabetes  mellitus: A1c was 9.7 on 03/13/12. Patient is taking metformin at home. Next-sliding scale insulin -Check A1c  Hypothyroidism: TSH was 1.0158 on 03/14/12. -Continue Synthroid  Dysuria: Sometimes the patient has dysuria -follow-up urine culture and urinalysis  Atrial Fibrillation: CHA2DS2-VASc Score is 7 , needs oral anticoagulation. Patient is on Xarelto. Heart rate is well controlled. -Continue Xarelto -continue Cardizem and sotalolo  DVT ppx: Xarelto  Code Status: Full code Family Communication: None at bed side.   Disposition Plan: Admit to inpatient   Date of Service 04/18/2015  Ivor Costa [facility] Pager [phone]  If 7PM-7AM, please contact night-coverage [email or web address] Password [facility] 04/18/2015, 5:44 AM

## 2015-04-17 NOTE — ED Notes (Signed)
Attempted to call report

## 2015-04-17 NOTE — ED Provider Notes (Signed)
CSN: [id number]     Arrival date & time 04/17/15  1441 History   First MD Initiated Contact with Patient 04/17/15 1628     Chief Complaint  Patient presents with  . Shortness of Breath   [name] is a 81 y.o. female with a history of lung cancer, diabetes, coronary artery disease, atrial fibrillation, and hypertension who presents to the emergency department complaining of worsening shortness of breath ongoing for the past 1-2 months that is worse over the past 3 weeks. Patient reports she is able to walk only 1-2 steps before becoming extremely short of breath. Patient also reports a productive cough ongoing for the past 2 weeks. The patient reports using oxygen at home only at night. The patient has a history of atrial fibrillation and sees cardiologist Dr. Einar Gip. She is on Xarelto. She is not currently on any diuretics. She reports worsening lower extremity edema bilaterally over the past several weeks. Last Echo in 2013 shows EF of 55-60%.  The patient denies fevers, chest pain, palpitations, abdominal pain, nausea, vomiting or urinary symptoms. The patient reports she has been diagnosed with lung cancer 10 years ago and still has follow-up appointments with her oncologist at [facility]. He reports this lung cancer has been stable. She denies any chemotherapy or radiation therapy for her lung cancer.  (Consider location/radiation/quality/duration/timing/severity/associated sxs/prior Treatment) HPI  Past Medical History  Diagnosis Date  . Lung cancer   . Stroke   . Diabetes mellitus   . CAD (coronary artery disease) 03/13/2012  . Type II or unspecified type diabetes mellitus without mention of complication, not stated as uncontrolled 03/13/2012  . Hypothyroid 03/13/2012  . HTN (hypertension) 03/13/2012  . Dysrhythmia     ATRIAL FIB  . Shortness of breath    Past Surgical History  Procedure Laterality Date  . Cardioversion  04/20/2012    Procedure: CARDIOVERSION;  Surgeon: Laverda Page, MD;  Location: [place];  Service: Cardiovascular;  Laterality: N/A;  . Abdominal hysterectomy    . Tumors      back of head  . Hemorrhoid surgery    . Jaw replacement    . Cholecystectomy    . Breast surgery      benign tumors removed in 1980  . Cardiac catheterization    . Cardioversion N/A 05/10/2013    Procedure: CARDIOVERSION;  Surgeon: Laverda Page, MD;  Location: [facility] ENDOSCOPY;  Service: Cardiovascular;  Laterality: N/A;   No family history on file. History  Substance Use Topics  . Smoking status: Never Smoker   . Smokeless tobacco: Never Used  . Alcohol Use: No   OB History    No data available     Review of Systems  Constitutional: Negative for fever and chills.  HENT: Negative for congestion and sore throat.   Eyes: Negative for visual disturbance.  Respiratory: Positive for cough, shortness of breath and wheezing.   Cardiovascular: Positive for leg swelling. Negative for chest pain and palpitations.  Gastrointestinal: Negative for nausea, vomiting, abdominal pain and diarrhea.  Genitourinary: Negative for dysuria.  Musculoskeletal: Negative for back pain and neck pain.  Skin: Negative for rash.  Neurological: Negative for light-headedness and headaches.      Allergies  Codeine and Hydrocodone-acetaminophen  Home Medications   Prior to Admission medications   Medication Sig Start Date End Date Taking? Authorizing Provider  atorvastatin (LIPITOR) 10 MG tablet Take 10 mg by mouth daily.    Historical Provider, MD  colesevelam [facility]) 625  MG tablet Take 625 mg by mouth daily as needed.     Historical Provider, MD  diltiazem (CARDIZEM CD) 180 MG 24 hr capsule Take 180 mg by mouth 2 (two) times daily.    Historical Provider, MD  levothyroxine (SYNTHROID, LEVOTHROID) 150 MCG tablet Take 150 mcg by mouth daily.    Historical Provider, MD  meclizine (ANTIVERT) 25 MG tablet Take 25 mg by mouth daily as needed for dizziness.     Historical Provider, MD   Rivaroxaban (XARELTO) 15 MG TABS tablet Take 15 mg by mouth daily with supper. 03/16/12   Adrian Prows, MD  sotalol (BETAPACE) 80 MG tablet Take 80 mg by mouth 2 (two) times daily.    Historical Provider, MD  valsartan (DIOVAN) 320 MG tablet Take 320 mg by mouth daily.    Historical Provider, MD  acetaminophen (TYLENOL) 500 MG tablet Take 1,000 mg by mouth every 6 (six) hours as needed for pain.     Historical Provider, MD  Calcium Carb-Cholecalciferol (CALCIUM-VITAMIN D3) 600-500 MG-UNIT CAPS Take 1 capsule by mouth daily.    Historical Provider, MD  Mag Oxide-Vit D3-Turmeric (MAGNESIUM-VITAMIN D3-TURMERIC PO) Take 1 tablet by mouth daily.    Historical Provider, MD  metFORMIN (GLUMETZA) 500 MG (MOD) 24 hr tablet Take 500 mg by mouth daily with breakfast.    Historical Provider, MD  Multiple Vitamins-Minerals (WOMENS BONE HEALTH PO) Take 1 tablet by mouth daily.    Historical Provider, MD  neomycin-polymyxin-dexameth (MAXITROL) 0.1 % OINT Place 1 application into both eyes 3 (three) times daily.    Historical Provider, MD  Polyethyl Glycol-Propyl Glycol (SYSTANE) 0.4-0.3 % SOLN Apply 2 drops to eye 2 (two) times daily as needed (dry eyes).    Historical Provider, MD  polyvinyl alcohol (LIQUIFILM TEARS) 1.4 % ophthalmic solution Place 2 drops into both eyes as needed.    Historical Provider, MD  traMADol (ULTRAM) 50 MG tablet Take 1 tablet (50 mg total) by mouth every 6 (six) hours as needed for pain. 07/14/13   Peter Dammen, PA-C   BP 114/79 mmHg  Pulse 77  Temp(Src) 98 F (36.7 C) (Oral)  Resp 16  SpO2 100% Physical Exam  Constitutional: She is oriented to person, place, and time. She appears well-developed and well-nourished. No distress.  HENT:  Head: Normocephalic and atraumatic.  Right Ear: External ear normal.  Left Ear: External ear normal.  Mouth/Throat: Oropharynx is clear and moist. No oropharyngeal exudate.  Eyes: Conjunctivae are normal. Pupils are equal, round, and reactive to  light. Right eye exhibits no discharge. Left eye exhibits no discharge.  Neck: Neck supple. No JVD present.  Cardiovascular: Normal rate, normal heart sounds and intact distal pulses.  Exam reveals no gallop and no friction rub.   No murmur heard. Bilateral radial, posterior tibialis and dorsalis pedis pulses are intact.  Irregularly irregular rhythm.   Pulmonary/Chest: Effort normal. No respiratory distress. She has no wheezes.  Patient able to speak in full sentences. Respiratory rate is 24. Lung sounds diminished bilaterally.   Abdominal: Soft. There is no tenderness.  Musculoskeletal: She exhibits edema. She exhibits no tenderness.  Mild bilateral lower extremity edema without erythema. No lower extremity tenderness.   Lymphadenopathy:    She has no cervical adenopathy.  Neurological: She is alert and oriented to person, place, and time. Coordination normal.  Skin: Skin is warm and dry. No rash noted. She is not diaphoretic. No erythema. No pallor.  Psychiatric: She has a normal mood and affect. Her  behavior is normal.  Nursing note and vitals reviewed.   ED Course  Procedures (including critical care time) Labs Review Labs Reviewed  CBC - Abnormal; Notable for the following:    RBC 3.63 (*)    Hemoglobin 10.6 (*)    HCT 31.2 (*)    All other components within normal limits  BASIC METABOLIC PANEL - Abnormal; Notable for the following:    Potassium 3.4 (*)    Glucose, Bld 338 (*)    Creatinine, Ser 1.46 (*)    GFR calc non Af Amer 33 (*)    GFR calc Af Amer 39 (*)    All other components within normal limits  BRAIN NATRIURETIC PEPTIDE - Abnormal; Notable for the following:    B Natriuretic Peptide 302.0 (*)    All other components within normal limits  D-DIMER, QUANTITATIVE - Abnormal; Notable for the following:    D-Dimer, Quant 1.13 (*)    All other components within normal limits  URINALYSIS, ROUTINE W REFLEX MICROSCOPIC - Abnormal; Notable for the following:    Color,  Urine AMBER (*)    APPearance CLOUDY (*)    Bilirubin Urine SMALL (*)    Ketones, ur 15 (*)    Leukocytes, UA SMALL (*)    All other components within normal limits  URINE MICROSCOPIC-ADD ON - Abnormal; Notable for the following:    Squamous Epithelial / LPF FEW (*)    Casts HYALINE CASTS (*)    Crystals CA OXALATE CRYSTALS (*)    All other components within normal limits  I-STAT TROPOININ, ED    Imaging Review Dg Chest 2 View  04/17/2015   CLINICAL DATA:  Shortness of breath and weakness. History of lung cancer.  EXAM: CHEST  2 VIEW  COMPARISON:  03/29/2015  FINDINGS: Heart size is mildly enlarged without evidence for edema or focal pulmonary opacity. Curvilinear left basilar scarring is noted. Mild hyperaeration suggesting COPD is stable. Remote left posterior rib fractures are reidentified. No pleural effusion. No pneumothorax. Bones are subjectively osteopenic.  IMPRESSION: No active cardiopulmonary disease.   Electronically Signed   By: Conchita Paris M.D.   On: 04/17/2015 17:16     EKG Interpretation   Date/Time:  Tuesday April 17 2015 14:46:41 EDT Ventricular Rate:  72 PR Interval:    QRS Duration: 64 QT Interval:  460 QTC Calculation: 503 R Axis:   92 Text Interpretation:  Atrial fibrillation Rightward axis Low voltage QRS  Septal infarct , age undetermined Abnormal ECG agree. no STEMI Confirmed  by Johnney Killian, MD, Jeannie Done [phone]) on 04/17/2015 5:33:42 PM      Filed Vitals:   04/17/15 2045 04/17/15 2058 04/17/15 2100 04/17/15 2115  BP: 111/64 111/64 113/90 116/74  Pulse: 65 79 89 90  Temp:      TempSrc:      Resp: '14 18 12 23  '$ SpO2: 98% 97% 97% 96%     MDM   Final diagnoses:  Dyspnea  Acute kidney injury   This is a 81 y.o. female with a history of lung cancer, diabetes, coronary artery disease, atrial fibrillation, and hypertension who presents to the emergency department complaining of worsening shortness of breath ongoing for the past 1-2 months that is worse  over the past 3 weeks. Patient reports she is able to walk only 1-2 steps before becoming extremely short of breath. Patient also reports a productive cough ongoing for the past 2 weeks. She has a history of chronic atrial fibrillation and is on Xarelto.  She denies missing any doses of her Xarelto. On exam the patient is afebrile and nontoxic appearing. She does appear to be slightly short of breath. She is speaking in full sentences. Her oxygen saturation is 95-97% on room air. EKG shows atrial fibrillation. The patient has a negative troponin. Urinalysis is nitrite negative with small leukocytes. On the patient's BMP shows an elevated creatinine of 1.46 with a GFR of 33. This is a significant change from her last blood work done in the emergency department. The patient's BNP is slightly elevated at 302. Chest x-ray is negative for active cardiopulmonary disease. There is no obvious reason for her worsening shortness of breath. D-dimer was done and it is elevated. It was decided not to do CT angiogram of her chest in the ED due to her worsening kidney function. Consulted with Dr. Blaine Hamper for admission with her worsening dyspnea and acute kidney injury. Patient is accepted for admission by Dr. Blaine Hamper. The patient is in agreement with admission.   This patient was discussed with Dr. Johnney Killian who agrees with assessment and plan.    Waynetta Pean, PA-C 04/17/15 5929  Charlesetta Shanks, MD 04/25/15 [phone]

## 2015-04-17 NOTE — ED Notes (Signed)
Pt's O2 sats remained around 96-97% while ambulating.  Pt stated felt SOB.  Dry cough noted.  PA made aware.

## 2015-04-18 ENCOUNTER — Inpatient Hospital Stay (HOSPITAL_COMMUNITY): Payer: Medicare Other

## 2015-04-18 ENCOUNTER — Observation Stay (HOSPITAL_COMMUNITY): Payer: Medicare Other

## 2015-04-18 ENCOUNTER — Encounter (HOSPITAL_COMMUNITY): Payer: Self-pay | Admitting: Internal Medicine

## 2015-04-18 DIAGNOSIS — R0609 Other forms of dyspnea: Secondary | ICD-10-CM | POA: Diagnosis not present

## 2015-04-18 DIAGNOSIS — R918 Other nonspecific abnormal finding of lung field: Secondary | ICD-10-CM | POA: Diagnosis not present

## 2015-04-18 DIAGNOSIS — I700 Atherosclerosis of aorta: Secondary | ICD-10-CM | POA: Diagnosis not present

## 2015-04-18 DIAGNOSIS — E119 Type 2 diabetes mellitus without complications: Secondary | ICD-10-CM | POA: Diagnosis not present

## 2015-04-18 DIAGNOSIS — I2584 Coronary atherosclerosis due to calcified coronary lesion: Secondary | ICD-10-CM | POA: Diagnosis not present

## 2015-04-18 DIAGNOSIS — N281 Cyst of kidney, acquired: Secondary | ICD-10-CM | POA: Diagnosis not present

## 2015-04-18 DIAGNOSIS — I480 Paroxysmal atrial fibrillation: Secondary | ICD-10-CM

## 2015-04-18 DIAGNOSIS — N179 Acute kidney failure, unspecified: Secondary | ICD-10-CM | POA: Diagnosis not present

## 2015-04-18 DIAGNOSIS — R0602 Shortness of breath: Secondary | ICD-10-CM | POA: Diagnosis not present

## 2015-04-18 DIAGNOSIS — I2510 Atherosclerotic heart disease of native coronary artery without angina pectoris: Secondary | ICD-10-CM | POA: Diagnosis not present

## 2015-04-18 DIAGNOSIS — E785 Hyperlipidemia, unspecified: Secondary | ICD-10-CM | POA: Diagnosis not present

## 2015-04-18 DIAGNOSIS — J90 Pleural effusion, not elsewhere classified: Secondary | ICD-10-CM | POA: Diagnosis not present

## 2015-04-18 DIAGNOSIS — I350 Nonrheumatic aortic (valve) stenosis: Secondary | ICD-10-CM | POA: Diagnosis not present

## 2015-04-18 LAB — COMPREHENSIVE METABOLIC PANEL
ALK PHOS: 72 U/L (ref 39–117)
ALT: 12 U/L (ref 0–35)
AST: 17 U/L (ref 0–37)
Albumin: 3.1 g/dL — ABNORMAL LOW (ref 3.5–5.2)
Anion gap: 11 (ref 5–15)
BUN: 14 mg/dL (ref 6–23)
CO2: 21 mmol/L (ref 19–32)
Calcium: 8 mg/dL — ABNORMAL LOW (ref 8.4–10.5)
Chloride: 106 mmol/L (ref 96–112)
Creatinine, Ser: 1.13 mg/dL — ABNORMAL HIGH (ref 0.50–1.10)
GFR, EST AFRICAN AMERICAN: 53 mL/min — AB (ref >90)
GFR, EST NON AFRICAN AMERICAN: 45 mL/min — AB (ref >90)
GLUCOSE: 286 mg/dL — AB (ref 70–99)
Potassium: 3.1 mmol/L — ABNORMAL LOW (ref 3.5–5.1)
Sodium: 138 mmol/L (ref 135–145)
TOTAL PROTEIN: 5.2 g/dL — AB (ref 6.0–8.3)
Total Bilirubin: 0.7 mg/dL (ref 0.3–1.2)

## 2015-04-18 LAB — CBC
HCT: 29.4 % — ABNORMAL LOW (ref 36.0–46.0)
Hemoglobin: 10.2 g/dL — ABNORMAL LOW (ref 12.0–15.0)
MCH: 29.6 pg (ref 26.0–34.0)
MCHC: 34.7 g/dL (ref 30.0–36.0)
MCV: 85.2 fL (ref 78.0–100.0)
PLATELETS: 129 10*3/uL — AB (ref 150–400)
RBC: 3.45 MIL/uL — AB (ref 3.87–5.11)
RDW: 13.3 % (ref 11.5–15.5)
WBC: 4.8 10*3/uL (ref 4.0–10.5)

## 2015-04-18 LAB — CREATININE, URINE, RANDOM: Creatinine, Urine: 23.3 mg/dL

## 2015-04-18 LAB — PROTIME-INR
INR: 1.89 — ABNORMAL HIGH (ref 0.00–1.49)
Prothrombin Time: 21.9 seconds — ABNORMAL HIGH (ref 11.6–15.2)

## 2015-04-18 LAB — GLUCOSE, CAPILLARY
GLUCOSE-CAPILLARY: 191 mg/dL — AB (ref 70–99)
GLUCOSE-CAPILLARY: 114 mg/dL — AB (ref 70–99)
GLUCOSE-CAPILLARY: 232 mg/dL — AB (ref 70–99)
Glucose-Capillary: 267 mg/dL — ABNORMAL HIGH (ref 70–99)

## 2015-04-18 MED ORDER — POTASSIUM CHLORIDE CRYS ER 20 MEQ PO TBCR
40.0000 meq | EXTENDED_RELEASE_TABLET | Freq: Once | ORAL | Status: AC
  Administered 2015-04-18: 40 meq via ORAL
  Filled 2015-04-18: qty 2

## 2015-04-18 MED ORDER — DM-GUAIFENESIN ER 30-600 MG PO TB12
1.0000 | ORAL_TABLET | Freq: Two times a day (BID) | ORAL | Status: DC
  Administered 2015-04-18 – 2015-04-20 (×5): 1 via ORAL
  Filled 2015-04-18 (×6): qty 1

## 2015-04-18 MED ORDER — ISOSORBIDE MONONITRATE ER 60 MG PO TB24
60.0000 mg | ORAL_TABLET | Freq: Every day | ORAL | Status: DC
  Administered 2015-04-18 – 2015-04-20 (×3): 60 mg via ORAL
  Filled 2015-04-18 (×3): qty 1

## 2015-04-18 MED ORDER — ATORVASTATIN CALCIUM 40 MG PO TABS
40.0000 mg | ORAL_TABLET | Freq: Every day | ORAL | Status: DC
  Administered 2015-04-19 – 2015-04-20 (×2): 40 mg via ORAL
  Filled 2015-04-18 (×2): qty 1

## 2015-04-18 NOTE — Progress Notes (Signed)
[facility] PROGRESS NOTE  TRISH MANCINELLI VOZ:[id number] DOB: June 11, 1934 DOA: 04/17/2015 PCP: Dwan Bolt, MD  Brief Summary  [name] is a 81 y.o. 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  Patient reported that she has been having a worsening shortness of breath with exertion in the past 3 weeks.  She had cough productive of green sputum and intermittent sharp chest pain, which usually last for a few minutes and resolves spontaneously. D-dimer was elevated, but she stated she had been compliant with her xarelto.    Assessment/Plan  SOB: Patient ded not have signs of infection and had been compliant with her xarelto.  BNP was 302, and not have leg edema, less lately to have congestive heart failure.   Initially thought she might of had worsening lung course in the right however her CT scan demonstrated mildly improved to stable lung nodules. -   Follow-up echocardiogram,  May have some pulmonary valve/artery changes secondary to her carcinoid -   Cardiology consult for consideration of stress test versus heart catheterization due to dyspnea on exertion , evidence of coronary artery calcifications -   Continue statin but increased high-dose -  Continue Xarelto, beta blocker -   Telemetry: Normal sinus rhythm -   Continue when necessary albuterol -   CT scan without contrast demonstrated stable to mildly improved bilateral lung nodules , incidentally noted heavy calcification of the coronary arteries and aorta  Lung carcinoid. 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 She was followed by  oncologsit at [facility]. Per Dr. Philbert Riser note on 02/07/13, pt has carcinoid/neuroendocrine tumor, which has been stable. She has an appointment with her oncologist in May or June per patient.   AoCKD-III: Baseline creatinines is about 1.0. Her creatinine is 1.46 on admission, BUN 17.  Trending down with IV fluids -Renal ultrasound without obstruction - continue to hold valsartan  Hypertension,  Low normal blood pressures -Continue Cardizem, sotalol - continue to hold valsartan -  continueIV prn hydralazine  Hypokalemia: -   Potassium 40 mEq by mouth once  Hyperlipidemia: No LDL record  - increase to high-dose statin given history of CAD  Diabetes mellitus: A1c was 9.7 on 03/13/12. - A1c  Pending -   CBG near goal -   Continue sliding scale insulin -   Continue to hold metformin  Hypothyroidism: TSH was 1.0158 on 03/14/12. -Continue Synthroid  Dysuria: Sometimes the patient has dysuria -  Urinalysis with only 3-6 to be BC and rare bacteria in the presence of squamous epithelials -  She does have some calcium oxalate crystals  Atrial Fibrillation: CHA2DS2-VASc Score is 7 , needs oral anticoagulation. Patient is on Xarelto. Heart rate is well controlled. -Continue Xarelto -continue Cardizem and sotalolo  Anemia of chronic disease hemoglobin at baseline -   Trend hemoglobin  Mild thrombocytopenia, appears to be a chronic problem  and likely acute phase reactant -   Trend platelets  Diet:   Diabetic Access:   PIV IVF:   off Proph:   Xarelto  Code Status:  full Family Communication:  Patient alone Disposition Plan:  Pending further improvement in kidney function, HHPT with rolling walker, cardiology consult, ECHO   Consultants:  cardiology  Procedures:   chest x-ray   CT chest without contrast   Renal ultrasound  Antibiotics:   none   HPI/Subjective:   patient states she has had some mild shortness of breath particularly with ambulation ,  currently chest pain-free    Objective: Filed Vitals:   04/17/15 2229 04/18/15 0609 04/18/15 1000 04/18/15 1515  BP: 134/68 107/53 124/63 115/78  Pulse: 86 120 77 83  Temp: 97.7 F (36.5 C) 97.4 F (36.3 C)  98.1 F (36.7 C)  TempSrc: Oral Oral  Oral  Resp: '22 20  18  '$ Weight:  68.9 kg (151 lb 14.4 oz)    SpO2: 98% 98%  96%    Intake/Output Summary (Last 24 hours) at 04/18/15 1901 Last data filed at 04/18/15 1835  Gross per 24 hour  Intake 2198.75 ml  Output    500 ml  Net 1698.75 ml   Filed Weights   04/18/15 0609  Weight: 68.9 kg (151 lb 14.4 oz)    Exam:   General:   Average weight female,No acute distress  HEENT:  NCAT, MMM  Cardiovascular:  RRR, nl S1, S2 no mrg, 2+ pulses, warm extremities  Respiratory:   Rales at the bilateral bases, no wheezes or rhonchi, no increased WOB  Abdomen:   NABS, soft, NT/ND  MSK:   Normal tone and bulk, no LEE  Neuro:  Grossly intact  Data Reviewed: Basic Metabolic Panel:  Recent Labs Lab 04/17/15 1459 04/18/15 0401  NA 135 138  K 3.4* 3.1*  CL 102 106  CO2 23 21  GLUCOSE 338* 286*  BUN 17 14  CREATININE 1.46* 1.13*  CALCIUM 8.5 8.0*   Liver Function Tests:  Recent Labs Lab 04/18/15 0401  AST 17  ALT 12  ALKPHOS 72  BILITOT 0.7  PROT 5.2*  ALBUMIN 3.1*   No results for input(s): LIPASE, AMYLASE in the last 168 hours. No results for input(s): AMMONIA in the last 168 hours. CBC:  Recent Labs Lab 04/17/15 1459 04/18/15 0401  WBC 7.7 4.8  HGB 10.6* 10.2*  HCT 31.2* 29.4*  MCV 86.0 85.2  PLT 151 129*   Cardiac Enzymes: No results for input(s): CKTOTAL, CKMB, CKMBINDEX, TROPONINI in the last 168 hours. BNP (last 3 results)  Recent Labs  04/17/15 1459  BNP 302.0*    ProBNP (last 3 results) No results for input(s): PROBNP in the last 8760 hours.  CBG:  Recent Labs Lab 04/18/15 0634 04/18/15 1137 04/18/15 1633  GLUCAP 191* 267* 114*    No results found for this or any previous  visit (from the past 240 hour(s)).   Studies: Dg Chest 2 View  04/17/2015   CLINICAL DATA:  Shortness of breath and weakness. History of lung cancer.  EXAM: CHEST  2 VIEW  COMPARISON:  03/29/2015  FINDINGS: Heart size is mildly enlarged without evidence for edema or focal pulmonary opacity. Curvilinear left basilar scarring is noted. Mild hyperaeration suggesting COPD is stable. Remote left posterior rib fractures are reidentified. No pleural effusion. No pneumothorax. Bones are subjectively osteopenic.  IMPRESSION: No active cardiopulmonary disease.   Electronically Signed   By: Conchita Paris M.D.   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 History of multiple pulmonary nodules. Intermittent shortness of breath, worsened with exertion.  EXAM: CT CHEST WITHOUT CONTRAST  TECHNIQUE: Multidetector CT imaging of the chest was performed following the standard  protocol without IV contrast.  COMPARISON:  03/13/2012  FINDINGS: Right middle lobe nodule measures 14 mm. When measured in the same planes an at the same level, this is stable. This is measured on image 38. Adjacent right middle lobe nodule on image 33 measures 9 mm, stable. Anterior right upper lobe nodule is on image 36 measures 8 mm, stable. Dominant right lower lobe nodule measures 18 mm on image 40, stable. Multiple left lower lobe nodules. Measures 7 mm on image 36 compared with 9 mm previously. Other smaller nodules in the lower lobes are stable. Few tiny scattered upper lobe nodules are also stable.  Small bilateral pleural effusions. It is difficult to measure and visualize the hilar lymph nodes without intravenous contrast. Small scattered mediastinal lymph nodes, none pathologically enlarged. 10 mm subcarinal lymph node is stable. No axillary adenopathy.  Aorta is heavily calcified, non aneurysmal. Chest wall soft tissues are unremarkable.  Imaging into the upper abdomen shows no acute findings. Prior cholecystectomy  No acute bony abnormality or focal bone lesion.  IMPRESSION: Numerous bilateral pulmonary nodules, most of which are stable. Slight decreased size of 1 of the left lower lobe pulmonary nodules.  Trace bilateral pleural effusions, new since prior study.  Dense coronary artery calcifications and aortic calcifications.   Electronically Signed   By: Rolm Baptise M.D.   On: 04/18/2015 09:18   US Renal  04/18/2015   CLINICAL DATA:  81 year old female with history of acute kidney injury.  EXAM: RENAL/URINARY TRACT ULTRASOUND COMPLETE  COMPARISON:  CT 11/13/2003  FINDINGS: Right Kidney:  Length: 10.2 cm. Echogenicity of the right kidney less than that of the adjacent liver. No evidence of pelvicaliectasis. Rounded anechoic focus in the lateral right kidney measures 11 -12 mm. Posterior acoustic enhancement without internal flow or complexity, compatible with Bosniak 1 cyst. Flow confirmed within the hilum of the right kidney.  Left Kidney:  Length: 10.4 cm. Echogenicity of the left kidney similar to that of the adjacent spleen. No pelvicaliectasis. Flow confirmed in the hilum of the left kidney.  Bladder:  Appears normal for degree of bladder distention.  IMPRESSION: Sonographic survey of the kidneys demonstrates no evidence of obstruction.  Signed,  Dulcy Fanny. Earleen Newport, DO  [facility]  [facility]   Electronically Signed   By: Corrie Mckusick D.O.   On: 04/18/2015 16:37    Scheduled Meds: . atorvastatin  10 mg Oral Daily  . calcium-vitamin D  1 tablet Oral Q breakfast  . colesevelam  625 mg Oral Daily  . dextromethorphan-guaiFENesin  1 tablet Oral BID  . diltiazem  180 mg Oral BID  . insulin aspart  0-9 Units Subcutaneous TID WC  . levothyroxine  150 mcg Oral QAC breakfast  . Rivaroxaban  15 mg Oral Q supper  . sodium chloride  3 mL Intravenous Q12H  . sotalol  80 mg Oral BID    Continuous Infusions:   Principal Problem:   SOB (shortness of breath) Active Problems:   Lung cancer   Stroke   Atrial fibrillation   HTN (hypertension)   Hypothyroid   Diabetes mellitus without complication   CAD (coronary artery disease)   Acute renal failure superimposed on stage 3 chronic kidney disease   Hypokalemia    Time spent: 30 min    Izaiha Lo, [facility] Pager [phone]. If 7PM-7AM, please contact night-coverage at [email or web address], password [facility] 04/18/2015, 7:01 PM  LOS: 1 day

## 2015-04-18 NOTE — Evaluation (Signed)
Occupational Therapy Evaluation Patient Details Name: [name] MRN: [id number] DOB: 02-Aug-1934 Today's Date: 04/18/2015    History of Present Illness Pt is a 81 y.o. 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    Clinical Impression   PTA pt lived at home with her husband and was independent with ADLs. Per pt, husband has health issues. Pt required min A to ambulate due to balance deficits. Of note, after OT session, OT documenting outside room and pt stood independently and was attempting to maneuver IV pole and recliner chair in order to get to the bathroom. Pt will benefit from acute OT to promote safety and independence with ADLs and would benefit from [facility] at d/c.     Follow Up Recommendations  Home health OT;Supervision/Assistance - 24 hour    Equipment Recommendations  3 in 1 bedside comode    Recommendations for Other Services       Precautions / Restrictions Precautions Precautions: Fall Restrictions Weight Bearing Restrictions: No      Mobility Bed Mobility               General bed mobility comments: Pt in w/c from CT and assisted to recliner. Did not assess bed mobility.   Transfers Overall transfer level: Needs assistance Equipment used: 1 person hand held assist Transfers: Sit to/from Stand Sit to Stand: Min assist         General transfer comment: Min A to stand safely. Of note, standing outside room and pt stood independently attempting to get to bathroom. Returned to assist pt to bathroom safely.          ADL Overall ADL's : Needs assistance/impaired Eating/Feeding: Independent;Sitting   Grooming: Set up;Sitting   Upper Body Bathing: Set up;Sitting   Lower Body Bathing: Minimal assistance;Sit to/from stand   Upper Body Dressing : Set  up;Sitting   Lower Body Dressing: Minimal assistance;Sit to/from stand   Toilet Transfer: Minimal assistance;Ambulation Toilet Transfer Details (indicate cue type and reason): min A to balance due to generalized weakness         Functional mobility during ADLs: Minimal assistance General ADL Comments: Pt limited by generalized weakness. SPO2 96% after walking to recliner chair.      Vision Additional Comments: No change from baseline          Pertinent Vitals/Pain Pain Assessment: No/denies pain        Extremity/Trunk Assessment Upper Extremity Assessment Upper Extremity Assessment: Generalized weakness   Lower Extremity Assessment Lower Extremity Assessment: Generalized weakness;Defer to PT evaluation   Cervical / Trunk Assessment Cervical / Trunk Assessment: Normal   Communication Communication Communication: No difficulties   Cognition Arousal/Alertness: Awake/alert Behavior During Therapy: WFL for tasks assessed/performed Overall Cognitive Status: Within Functional Limits for tasks assessed                                Home Living Family/patient expects to be discharged to:: Private residence Living Arrangements: Spouse/significant other Available Help at Discharge: Family;Available 24 hours/day (husband in poor health; other family does not live close) Type of Home: House Home Access: Stairs to enter [employer] of Steps: 8 in back (primary entrance) Entrance Stairs-Rails: Right;Left;Can reach both Home Layout: One level     Bathroom Shower/Tub: Tub/shower unit  Shower/tub characteristics: Architectural technologist: Standard     Home Equipment: Marine scientist - single point   Additional Comments: Pt's husband has health issues and may need to undergo cardiac surgery, per pt.       Prior Functioning/Environment Level of Independence: Independent        Comments: Pt reports she does not use cane and "holds onto furniture"  around the house. She reports that she and her husband perform homecare tasks. Pt and husband both still drive, though pt's husband advised not to due to cardiac concerns.     OT Diagnosis: Generalized weakness   OT Problem List: Decreased strength;Decreased activity tolerance;Impaired balance (sitting and/or standing)   OT Treatment/Interventions: Therapeutic exercise;Self-care/ADL training;Energy conservation;DME and/or AE instruction;Therapeutic activities;Patient/family education;Balance training    OT Goals(Current goals can be found in the care plan section) Acute Rehab OT Goals Patient Stated Goal: To get back home with my husband OT Goal Formulation: With patient Time For Goal Achievement: 05/02/15 Potential to Achieve Goals: Good ADL Goals Pt Will Perform Lower Body Bathing: with supervision;sit to/from stand Pt Will Perform Lower Body Dressing: with supervision;sit to/from stand Pt Will Transfer to Toilet: with supervision;ambulating Pt Will Perform Toileting - Clothing Manipulation and hygiene: with supervision;sit to/from stand Pt Will Perform Tub/Shower Transfer: Tub transfer;with supervision;ambulating;shower seat  OT Frequency: Min 2X/week    End of Session Nurse Communication: Mobility status  Activity Tolerance: Patient tolerated treatment well Patient left: in bed;with call bell/phone within reach   Time: 3009-2330 OT Time Calculation (min): 20 min Charges:  OT General Charges $OT Visit: 1 Procedure OT Evaluation $Initial OT Evaluation Tier I: 1 Procedure G-Codes: OT G-codes **NOT FOR INPATIENT CLASS** Functional Assessment Tool Used: clinical judgement Functional Limitation: Self care Self Care Current Status (Q7622): At least 1 percent but less than 20 percent impaired, limited or restricted Self Care Goal Status (Q3335): 0 percent impaired, limited or restricted  Juluis Rainier 04/18/2015, 9:20 AM  Cyndie Chime, OTR/L Occupational  Therapist [phone] (pager)

## 2015-04-18 NOTE — Care Management Note (Unsigned)
    Page 1 of 1   04/20/2015     5:04:33 PM CARE MANAGEMENT NOTE 04/20/2015  Patient:  [name], [name]   Account Number:  [id number]  Date Initiated:  04/18/2015  Documentation initiated by:  Kelsy Polack  Subjective/Objective Assessment:   Pt adm on 04/17/15 with SOB, CP.  PTA, pt resides at home with spouse.     Action/Plan:   Will follow for discharge needs as pt progresses.   Anticipated DC Date:  04/21/2015   Anticipated DC Plan:  [place]  CM consult      Choice offered to / List presented to:             Status of service:  In process, will continue to follow Medicare Important Message given?  YES (If response is "NO", the following Medicare IM given date fields will be blank) Date Medicare IM given:  04/20/2015 Medicare IM given by:  Virgel Haro Date Additional Medicare IM given:   Additional Medicare IM given by:    Discharge Disposition:    Per UR Regulation:  Reviewed for med. necessity/level of care/duration of stay  If discussed at [place] of Stay Meetings, dates discussed:    Comments:  04/20/15 Ellan Lambert, RN, BSN [phone] PT/OT recommending [facility] follow up at dc.  MD, please leave orders if you agree.

## 2015-04-18 NOTE — Progress Notes (Signed)
Patient is SOB. IV infusion is still running. MD made aware. Told to stop infusion until she could follow up with patient for now. Will continue to monitor patient for further changes in condition.

## 2015-04-18 NOTE — Evaluation (Signed)
Physical Therapy Evaluation Patient Details Name: [name] MRN: [id number] DOB: [birth year] Today's Date: 04/18/2015   History of Present Illness  Pt is a 81 y.o. 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   Clinical Impression  Pt admitted with above diagnosis. Pt currently with functional limitations due to the deficits listed below (see PT Problem List). Pt vitals stable with ambulation but pt with 2-3/4 DOE and very fatigued after 200' of ambulation. Safer with RW than without AD.  Pt will benefit from skilled PT to increase their independence and safety with mobility to allow discharge to the venue listed below.       Follow Up Recommendations Home health PT;Supervision for mobility/OOB    Equipment Recommendations  Rolling walker with 5" wheels    Recommendations for Other Services       Precautions / Restrictions Precautions Precautions: Fall Precaution Comments: pt reports she has not fallen recently (past year) Restrictions Weight Bearing Restrictions: No      Mobility  Bed Mobility Overal bed mobility: Independent                Transfers Overall transfer level: Needs assistance Equipment used: None;Rolling walker (2 wheeled) Transfers: Sit to/from Stand Sit to Stand: Min guard         General transfer comment: min-guard for safety as pt immediately reaches for stable surface to hold which affects posture, enforcing flexed trunk  Ambulation/Gait Ambulation/Gait assistance: Min guard;Min assist Ambulation Distance (Feet): 200 Feet Assistive device: Rolling walker (2 wheeled);None Gait Pattern/deviations: Step-through pattern;Trunk flexed Gait velocity: decreased   General Gait Details: first 74' without AD, min A for safety as pt reached across room for sink  and then door. Pt advised to use RW and then ambulated with improved posture as well as min-guard A. vc's for safe use of RW including staying within RW during ambulation. Pt reported that she did feel safer with RW but that hers at home was very heavy without wheels.  Stairs            Wheelchair Mobility    Modified Rankin (Stroke Patients Only)       Balance Overall balance assessment: Needs assistance Sitting-balance support: No upper extremity supported;Feet supported Sitting balance-Leahy Scale: Good     Standing balance support: No upper extremity supported;During functional activity Standing balance-Leahy Scale: Fair                               Pertinent Vitals/Pain Pain Assessment: Faces Faces Pain Scale: Hurts little more Pain Location: neck Pain Intervention(s): Heat applied    Home Living Family/patient expects to be discharged to:: Private residence Living Arrangements: Spouse/significant other Available Help at Discharge: Family;Available 24 hours/day Type of Home: House Home Access: Stairs to enter Entrance Stairs-Rails: Right;Left;Can reach both Entrance Stairs-Number of Steps: 8 in back (primary entrance) Home Layout: One level Home Equipment: Shower seat;Cane - single point Additional Comments: Pt's husband has health issues and may need to undergo cardiac surgery, per pt. Other family lives nearby. Pt and husband both drive and shop together. Pt does cooking and cleaning     Prior Function Level of Independence: Independent         Comments: pt reports she has cane she can use as  well as old RW from motherin-law. Usually furniture walks      Hand Dominance        Extremity/Trunk Assessment   Upper Extremity Assessment: Defer to OT evaluation           Lower Extremity Assessment: Generalized weakness      Cervical / Trunk Assessment: Kyphotic  Communication   Communication: No difficulties  Cognition  Arousal/Alertness: Awake/alert Behavior During Therapy: WFL for tasks assessed/performed Overall Cognitive Status: Within Functional Limits for tasks assessed                      General Comments General comments (skin integrity, edema, etc.): O2 sats 96%, HR 86 bpm with ambulation but DOE 2-3/4 with increased WOB    Exercises        Assessment/Plan    PT Assessment Patient needs continued PT services  PT Diagnosis Difficulty walking;Generalized weakness;Acute pain   PT Problem List Decreased strength;Decreased activity tolerance;Decreased balance;Decreased mobility;Decreased knowledge of use of DME;Decreased safety awareness;Decreased knowledge of precautions;Cardiopulmonary status limiting activity;Pain  PT Treatment Interventions DME instruction;Gait training;Stair training;Functional mobility training;Therapeutic exercise;Therapeutic activities;Balance training;Patient/family education   PT Goals (Current goals can be found in the Care Plan section) Acute Rehab PT Goals Patient Stated Goal: To get back home with my husband PT Goal Formulation: With patient Time For Goal Achievement: 05/02/15 Potential to Achieve Goals: Good    Frequency Min 3X/week   Barriers to discharge Decreased caregiver support husband elderly with cardiac issues    Co-evaluation               End of Session Equipment Utilized During Treatment: Gait belt Activity Tolerance: Patient tolerated treatment well Patient left: in bed;with call bell/phone within reach Nurse Communication: Mobility status         Time: 1421-1444 PT Time Calculation (min) (ACUTE ONLY): 23 min   Charges:   PT Evaluation $Initial PT Evaluation Tier I: 1 Procedure PT Treatments $Gait Training: 8-22 mins   PT G Codes:       Leighton Roach, PT  Acute Rehab Services  [place], [place] 04/18/2015, 3:06 PM

## 2015-04-18 NOTE — Progress Notes (Signed)
Inpatient Diabetes Program Recommendations  AACE/ADA: New Consensus Statement on Inpatient Glycemic Control (2013)  Target Ranges:  Prepandial:   less than 140 mg/dL      Peak postprandial:   less than 180 mg/dL (1-2 hours)      Critically ill patients:  140 - 180 mg/dL   Spoke with patient concerning "refusing insulin".  Pt states she took insulin a few years ago but quit taking it and her glucose "went down".  She did take her scheduled noon time dose today.  Will follow. Thank you  Raoul Pitch BSN, RN,CDE Inpatient Diabetes Coordinator [phone] (team pager)

## 2015-04-18 NOTE — Consult Note (Signed)
CARDIOLOGY CONSULT NOTE  Patient ID: [name] MRN: [id number] DOB/AGE: 02-Mar-1934 81 y.o.  Admit date: 04/17/2015 Referring Physician: [facility] Primary Physician:  Dwan Bolt, MD Reason for Consultation: Dyspnea on exertion  HPI: [name] is a 81 year old fairly active Caucasian female with a history of HTN, HLD, DM, and chronic a.fib who has rare form of low-grade lung cancer being followed at [facility](?Carcinoid).  She was last seen on an outpatient basis 2 weeks ago with increasing dyspnea and intermittent lower extremity edema.  She was restarted on digoxin at that time for A. Fib with rapid response and scheduled for echocardiogram which had not been scheduled to be performed until next week.  She presented to the emergency department on 04/17/2015 with persistent dyspnea, worse with exertion. Patient had gone shopping, could not finish her shopping could not walk much, husband brought her to the emergency room.  In the emergency room she was found to be in acute renal failure with elevated serum creatinine. I been consulted to evaluate worst and dyspnea and also CT scan of the chest revealing severe coronary calcification.  Patient has been having chest pain with exertional activity, described as heaviness to pressure-like sensation in the left upper part of the chest, sometimes radiates to her neck. It comes on with exertion and is relieved with rest. This has been ongoing for the past 6 months to a year, gradually worsening.  She denies any PND or orthopnea. No leg edema. Her other complaint is difficulty swallowing, at times has feeling of food being stuck in her chest.  Past Medical History  Diagnosis Date  . Lung cancer   . Stroke   . Diabetes mellitus   . CAD (coronary artery disease) 03/13/2012  . Type II or unspecified type diabetes mellitus without mention of complication, not stated as uncontrolled 03/13/2012  . Hypothyroid 03/13/2012  . HTN  (hypertension) 03/13/2012  . Dysrhythmia     ATRIAL FIB  . Shortness of breath      Past Surgical History  Procedure Laterality Date  . Cardioversion  04/20/2012    Procedure: CARDIOVERSION;  Surgeon: Laverda Page, MD;  Location: [place];  Service: Cardiovascular;  Laterality: N/A;  . Abdominal hysterectomy    . Tumors      back of head  . Hemorrhoid surgery    . Jaw replacement    . Cholecystectomy    . Breast surgery      benign tumors removed in 1980  . Cardiac catheterization    . Cardioversion N/A 05/10/2013    Procedure: CARDIOVERSION;  Surgeon: Laverda Page, MD;  Location: [facility];  Service: Cardiovascular;  Laterality: N/A;     Family History  Problem Relation Age of Onset  . Breast cancer Mother   . Aneurysm Father     Likely thoracic aneurysm per patient  . Melanoma Brother   . Heart disease Sister   . Diabetes type II Sister      Social History: History   Social History  . Marital Status: Married    Spouse Name: N/A  . Number of Children: N/A  . Years of Education: N/A   Occupational History  . Not on file.   Social History Main Topics  . Smoking status: Never Smoker   . Smokeless tobacco: Never Used  . Alcohol Use: No  . Drug Use: No  . Sexual Activity: Not on file   Other Topics Concern  . Not on file   Social  History Narrative     Prescriptions prior to admission  Medication Sig Dispense Refill Last Dose  . acetaminophen (TYLENOL) 500 MG tablet Take 1,000 mg by mouth every 6 (six) hours as needed for pain.    04/15/2015 at Unknown time  . atorvastatin (LIPITOR) 10 MG tablet Take 10 mg by mouth daily.   04/17/2015 at Unknown time  . Calcium Carb-Cholecalciferol (CALCIUM-VITAMIN D3) 600-500 MG-UNIT CAPS Take 1 capsule by mouth daily.   04/17/2015 at Unknown time  . colesevelam (WELCHOL) 625 MG tablet Take 625 mg by mouth daily.    04/17/2015 at Unknown time  . diltiazem (CARDIZEM CD) 180 MG 24 hr capsule Take 180 mg by mouth 2 (two)  times daily.   04/17/2015 at Unknown time  . levothyroxine (SYNTHROID, LEVOTHROID) 150 MCG tablet Take 150 mcg by mouth daily.   04/17/2015 at Unknown time  . Mag Oxide-Vit D3-Turmeric (MAGNESIUM-VITAMIN D3-TURMERIC PO) Take 1 tablet by mouth daily.   04/17/2015 at Unknown time  . meclizine (ANTIVERT) 25 MG tablet Take 25 mg by mouth daily as needed for dizziness.    unknown  . metFORMIN (GLUMETZA) 500 MG (MOD) 24 hr tablet Take 500 mg by mouth daily with breakfast.   04/17/2015 at Unknown time  . Multiple Vitamins-Minerals (WOMENS BONE HEALTH PO) Take 1 tablet by mouth daily.   04/17/2015 at Unknown time  . Polyethyl Glycol-Propyl Glycol (SYSTANE) 0.4-0.3 % SOLN Apply 2 drops to eye 2 (two) times daily as needed (dry eyes).   04/15/2015  . polyvinyl alcohol (LIQUIFILM TEARS) 1.4 % ophthalmic solution Place 2 drops into both eyes as needed for dry eyes.    04/15/2015  . Rivaroxaban (XARELTO) 15 MG TABS tablet Take 15 mg by mouth daily with supper.   04/16/2015 at Unknown time  . sotalol (BETAPACE) 80 MG tablet Take 80 mg by mouth 2 (two) times daily.   04/17/2015 at 0830  . traMADol (ULTRAM) 50 MG tablet Take 1 tablet (50 mg total) by mouth every 6 (six) hours as needed for pain. (Patient not taking: Reported on 04/17/2015) 10 tablet 0 Completed Course at Unknown time  . valsartan (DIOVAN) 320 MG tablet Take 320 mg by mouth daily.   04/17/2015 at Unknown time    Scheduled Meds: . atorvastatin  10 mg Oral Daily  . calcium-vitamin D  1 tablet Oral Q breakfast  . colesevelam  625 mg Oral Daily  . dextromethorphan-guaiFENesin  1 tablet Oral BID  . diltiazem  180 mg Oral BID  . insulin aspart  0-9 Units Subcutaneous TID WC  . levothyroxine  150 mcg Oral QAC breakfast  . Rivaroxaban  15 mg Oral Q supper  . sodium chloride  3 mL Intravenous Q12H  . sotalol  80 mg Oral BID   Continuous Infusions:  PRN Meds:.acetaminophen, albuterol, hydrALAZINE, meclizine, ondansetron **OR** ondansetron (ZOFRAN) IV,  polyvinyl alcohol  ROS: General: no fevers/chills/night sweats Eyes: no blurry vision, diplopia, or amaurosis ENT: no sore throat or hearing loss Resp: reports SOB on exertion; no cough, wheezing, or hemoptysis CV: reports intermittent edema and  chest pain;  No PND, orthopnea or palpitations GI: Has mild dysphagia. no abdominal pain, nausea, vomiting, diarrhea, or constipation GU: no dysuria, frequency, or hematuria Skin: no rash Neuro: no headache, numbness, tingling, or weakness of extremities Musculoskeletal: no joint pain or swelling Heme: no bleeding, DVT, or easy bruising Endo: no polydipsia or polyuria    Physical Exam: Blood pressure 115/78, pulse 83, temperature 98.1 F (36.7 C),  temperature source Oral, resp. rate 18, weight 68.9 kg (151 lb 14.4 oz), SpO2 96 %.   General appearance: alert, cooperative, appears stated age and no distress Lungs: Bilateral scattered crackles, coarse crackles at bilateral bases. Heart: S1 is variable, S2 is normal. No gallop or murmur appreciated. Abdomen: soft, non-tender; bowel sounds normal; no masses,  no organomegaly Extremities: extremities normal, atraumatic, no cyanosis or edema Pulses: 2+ and symmetric Neurologic: Grossly normal  Labs:   Lab Results  Component Value Date   WBC 4.8 04/18/2015   HGB 10.2* 04/18/2015   HCT 29.4* 04/18/2015   MCV 85.2 04/18/2015   PLT 129* 04/18/2015    Recent Labs Lab 04/18/15 0401  NA 138  K 3.1*  CL 106  CO2 21  BUN 14  CREATININE 1.13*  CALCIUM 8.0*  PROT 5.2*  BILITOT 0.7  ALKPHOS 72  ALT 12  AST 17  GLUCOSE 286*   Lab Results  Component Value Date   CKTOTAL 66 03/14/2012   CKMB 2.0 03/14/2012   TROPONINI <0.30 03/14/2012    Lipid Panel  No results found for: CHOL, TRIG, HDL, CHOLHDL, VLDL, LDLCALC  EKG: Atrial fibrillation with controlled response, nonspecific T abnormality. No evidence of ischemia. Moreland criteria for LVH. Low-voltage complexes. No change from  prior EKG.  Echocardiogram: Pending    Radiology: Dg Chest 2 View  04/17/2015   CLINICAL DATA:  Shortness of breath and weakness. History of lung cancer.  EXAM: CHEST  2 VIEW  COMPARISON:  03/29/2015  FINDINGS: Heart size is mildly enlarged without evidence for edema or focal pulmonary opacity. Curvilinear left basilar scarring is noted. Mild hyperaeration suggesting COPD is stable. Remote left posterior rib fractures are reidentified. No pleural effusion. No pneumothorax. Bones are subjectively osteopenic.  IMPRESSION: No active cardiopulmonary disease.   Electronically Signed   By: Conchita Paris M.D.   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 History of multiple pulmonary nodules. Intermittent shortness of breath, worsened with exertion.  EXAM: CT CHEST WITHOUT CONTRAST  TECHNIQUE: Multidetector CT imaging of the chest was performed following the standard protocol without IV contrast.  COMPARISON:  03/13/2012  FINDINGS: Right middle lobe nodule measures 14 mm. When measured in the same planes an at the same level, this is stable. This is measured on image 38. Adjacent right middle lobe nodule on image 33 measures 9 mm, stable. Anterior right upper lobe nodule is on image 36 measures 8 mm, stable. Dominant right lower lobe nodule measures 18 mm on image 40, stable. Multiple left lower lobe nodules. Measures 7 mm on image 36 compared with 9 mm previously. Other smaller nodules in the lower lobes are stable. Few tiny scattered upper lobe nodules are also stable.  Small bilateral pleural effusions. It is difficult to measure and visualize the hilar lymph nodes without intravenous contrast. Small scattered mediastinal lymph nodes, none pathologically enlarged. 10 mm subcarinal lymph node is stable. No axillary adenopathy.  Aorta is heavily calcified, non aneurysmal. Chest wall soft tissues are  unremarkable. Imaging into the upper abdomen shows no acute findings. Prior cholecystectomy  No acute bony abnormality or focal bone lesion.  IMPRESSION: Numerous bilateral pulmonary nodules, most of which are stable. Slight decreased size of 1 of the left lower lobe pulmonary nodules.  Trace bilateral pleural effusions, new since prior study.  Dense coronary artery calcifications and aortic calcifications.   Electronically Signed  By: Rolm Baptise M.D.   On: 04/18/2015 09:18   US Renal  04/18/2015   CLINICAL DATA:  81 year old female with history of acute kidney injury.  EXAM: RENAL/URINARY TRACT ULTRASOUND COMPLETE  COMPARISON:  CT 11/13/2003  FINDINGS: Right Kidney:  Length: 10.2 cm. Echogenicity of the right kidney less than that of the adjacent liver. No evidence of pelvicaliectasis. Rounded anechoic focus in the lateral right kidney measures 11 -12 mm. Posterior acoustic enhancement without internal flow or complexity, compatible with Bosniak 1 cyst. Flow confirmed within the hilum of the right kidney.  Left Kidney:  Length: 10.4 cm. Echogenicity of the left kidney similar to that of the adjacent spleen. No pelvicaliectasis. Flow confirmed in the hilum of the left kidney.  Bladder:  Appears normal for degree of bladder distention.  IMPRESSION: Sonographic survey of the kidneys demonstrates no evidence of obstruction.  Signed,  Dulcy Fanny. Earleen Newport, DO  [facility]  [facility]   Electronically Signed   By: Corrie Mckusick D.O.   On: 04/18/2015 16:37    ASSESSMENT AND PLAN:  1. Dyspnea on exertion, probably related to chronic lung disease, deconditioning, underlying coronary artery disease could also be contributing. 2. Chronic a.fib: CHA2DS2-VASc Score is 6 with yearly risk of stroke of 9.8%.  HAS-Bled score is 2 and estimated major bleeding in one year is 1.88-3.2%, currently on Xarelto 3. Chest pain suggestive of angina pectoris 4. HTN 5. Coronary artery  disease, remote coronary angiography had revealed moderate diffuse disease. Medical therapy was recommended. 6. Acute renal insufficiency, chronic renal insufficiency secondary to diabetes mellitus, stage III chronic kidney disease. Acute renal injury now resolved with IV hydration. 7. Diabetes mellitus type 2 uncontrolled  Recommendation: Patient's symptoms of dyspnea on exertion a total proportion to her presentation with acute coronary syndrome. No EKG abnormalities to suggest ACS, serum troponin negative. BNP does not indicate significant diastolic heart failure. I suspect underlying lung disease to be the etiology along with mild chronic diastolic heart failure.  With regard to coronary calcification and CAD, I would  prefer to perform coronary angiography, however do to her acute renal insufficiency, would prefer to do it on outpatient basis in elective fashion as the presentation is not consistent with ACS. I will add Imdur 60 mg by mouth daily. Unless her situation changes, I will see her back in the outpatient basis.   Adrian Prows, MD 04/18/2015, 4:52 PM [facility] Pager: [phone] Office: [phone] If no answer Cell [phone]

## 2015-04-19 DIAGNOSIS — I350 Nonrheumatic aortic (valve) stenosis: Secondary | ICD-10-CM | POA: Diagnosis not present

## 2015-04-19 DIAGNOSIS — E119 Type 2 diabetes mellitus without complications: Secondary | ICD-10-CM | POA: Diagnosis not present

## 2015-04-19 DIAGNOSIS — I2584 Coronary atherosclerosis due to calcified coronary lesion: Secondary | ICD-10-CM | POA: Diagnosis not present

## 2015-04-19 DIAGNOSIS — I2510 Atherosclerotic heart disease of native coronary artery without angina pectoris: Secondary | ICD-10-CM | POA: Diagnosis not present

## 2015-04-19 DIAGNOSIS — E785 Hyperlipidemia, unspecified: Secondary | ICD-10-CM | POA: Diagnosis not present

## 2015-04-19 DIAGNOSIS — N179 Acute kidney failure, unspecified: Secondary | ICD-10-CM | POA: Diagnosis not present

## 2015-04-19 DIAGNOSIS — R0602 Shortness of breath: Secondary | ICD-10-CM | POA: Diagnosis not present

## 2015-04-19 DIAGNOSIS — R0609 Other forms of dyspnea: Secondary | ICD-10-CM | POA: Diagnosis not present

## 2015-04-19 DIAGNOSIS — M79609 Pain in unspecified limb: Secondary | ICD-10-CM | POA: Diagnosis not present

## 2015-04-19 DIAGNOSIS — R5381 Other malaise: Secondary | ICD-10-CM | POA: Diagnosis not present

## 2015-04-19 DIAGNOSIS — N183 Chronic kidney disease, stage 3 (moderate): Secondary | ICD-10-CM | POA: Diagnosis not present

## 2015-04-19 LAB — GLUCOSE, CAPILLARY
GLUCOSE-CAPILLARY: 229 mg/dL — AB (ref 70–99)
Glucose-Capillary: 266 mg/dL — ABNORMAL HIGH (ref 70–99)
Glucose-Capillary: 228 mg/dL — ABNORMAL HIGH (ref 70–99)
Glucose-Capillary: 229 mg/dL — ABNORMAL HIGH (ref 70–99)

## 2015-04-19 LAB — CBC
HCT: 28.8 % — ABNORMAL LOW (ref 36.0–46.0)
HEMOGLOBIN: 10 g/dL — AB (ref 12.0–15.0)
MCH: 29.9 pg (ref 26.0–34.0)
MCHC: 34.7 g/dL (ref 30.0–36.0)
MCV: 86 fL (ref 78.0–100.0)
Platelets: 126 10*3/uL — ABNORMAL LOW (ref 150–400)
RBC: 3.35 MIL/uL — ABNORMAL LOW (ref 3.87–5.11)
RDW: 13.4 % (ref 11.5–15.5)
WBC: 5.8 10*3/uL (ref 4.0–10.5)

## 2015-04-19 LAB — BASIC METABOLIC PANEL
Anion gap: 9 (ref 5–15)
BUN: 16 mg/dL (ref 6–23)
CO2: 22 mmol/L (ref 19–32)
Calcium: 8.6 mg/dL (ref 8.4–10.5)
Chloride: 107 mmol/L (ref 96–112)
Creatinine, Ser: 0.9 mg/dL (ref 0.50–1.10)
GFR calc Af Amer: 69 mL/min — ABNORMAL LOW (ref >90)
GFR calc non Af Amer: 60 mL/min — ABNORMAL LOW (ref >90)
GLUCOSE: 258 mg/dL — AB (ref 70–99)
POTASSIUM: 4.4 mmol/L (ref 3.5–5.1)
Sodium: 138 mmol/L (ref 135–145)

## 2015-04-19 LAB — HEMOGLOBIN A1C
HEMOGLOBIN A1C: 9.4 % — AB (ref 4.8–5.6)
Mean Plasma Glucose: 223 mg/dL

## 2015-04-19 MED ORDER — INSULIN DETEMIR 100 UNIT/ML ~~LOC~~ SOLN
5.0000 [IU] | Freq: Every day | SUBCUTANEOUS | Status: DC
  Administered 2015-04-19: 5 [IU] via SUBCUTANEOUS
  Filled 2015-04-19 (×3): qty 0.05

## 2015-04-19 NOTE — Progress Notes (Signed)
  Echocardiogram 2D Echocardiogram has been performed.  Diamond Nickel 04/19/2015, 10:53 AM

## 2015-04-19 NOTE — Progress Notes (Signed)
Physical Therapy Treatment Patient Details Name: [name] MRN: [id number] DOB: 27-Oct-1934 Today's Date: 04/19/2015    History of Present Illness Pt is a 81 y.o. 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     PT Comments    Pt very pleasant and chatting throughout gait, stairs and HEP without dyspnea today and sats maintained 98% on RA. Pt educated for RW use and HEP and will continue to follow to maximize independence and function.   Follow Up Recommendations  Home health PT     Equipment Recommendations  Rolling walker with 5" wheels (short RW)    Recommendations for Other Services       Precautions / Restrictions Precautions Precautions: Fall Restrictions Weight Bearing Restrictions: No    Mobility  Bed Mobility Overal bed mobility: Modified Independent             General bed mobility comments: use of rail for OOB  Transfers Overall transfer level: Modified independent                  Ambulation/Gait Ambulation/Gait assistance: Supervision Ambulation Distance (Feet): 400 Feet Assistive device: Rolling walker (2 wheeled) Gait Pattern/deviations: Step-through pattern;Decreased stride length;Trunk flexed   Gait velocity interpretation: at or above normal speed for age/gender General Gait Details: cues for posture, position in RW and safety with directional cues to return to room   Stairs Stairs: Yes Stairs assistance: Modified independent (Device/Increase time) Stair Management: Two rails;Step to pattern;Forwards Number of Stairs: 8 General stair comments: safe use with step to pattern and rails  Wheelchair Mobility    Modified Rankin (Stroke Patients Only)       Balance Overall balance assessment: Needs assistance   Sitting balance-Leahy Scale:  Good       Standing balance-Leahy Scale: Fair                      Cognition Arousal/Alertness: Awake/alert Behavior During Therapy: WFL for tasks assessed/performed Overall Cognitive Status: Within Functional Limits for tasks assessed                      Exercises General Exercises - Lower Extremity Long Arc Quad: AROM;Seated;Both;20 reps Hip ABduction/ADduction: AROM;Seated;Both;20 reps Hip Flexion/Marching: AROM;Seated;Both;20 reps    General Comments        Pertinent Vitals/Pain Pain Assessment: No/denies pain    Home Living                      Prior Function            PT Goals (current goals can now be found in the care plan section) Progress towards PT goals: Progressing toward goals    Frequency       PT Plan Discharge plan needs to be updated    Co-evaluation             End of Session   Activity Tolerance: Patient tolerated treatment well Patient left: in chair;with call bell/phone within reach     Time: 0910-0933 PT Time Calculation (min) (ACUTE ONLY): 23 min  Charges:  $Gait Training: 8-22 mins $Therapeutic Exercise: 8-22 mins                    G Codes:      Lanetta Inch  Beth 04/19/2015, 9:39 AM [facility] Reach, [place]

## 2015-04-19 NOTE — Progress Notes (Signed)
Inpatient Diabetes Program Recommendations  AACE/ADA: New Consensus Statement on Inpatient Glycemic Control (2013)  Target Ranges:  Prepandial:   less than 140 mg/dL      Peak postprandial:   less than 180 mg/dL (1-2 hours)      Critically ill patients:  140 - 180 mg/dL  Results for SHARMAN, GARROTT (MRN [id number]) as of 04/19/2015 10:03  Ref. Range 04/18/2015 06:34 04/18/2015 11:37 04/18/2015 16:33 04/18/2015 21:30 04/19/2015 07:07  Glucose-Capillary Latest Ref Range: 70-99 mg/dL 191 (H) 267 (H) 114 (H) 232 (H) 229 (H)   Inpatient Diabetes Program Recommendations Insulin - Basal: consider adding low dose basal Lantus 5 units  Thank you  Raoul Pitch BSN, RN,CDE Inpatient Diabetes Coordinator [phone] (team pager)

## 2015-04-19 NOTE — Progress Notes (Signed)
Subjective:  Feels better today and ate all her breakfast. No chest pain and dyspnea improved. States she was not comfortable at night due to alarms going off and dis not sleep well.  Objective:  Vital Signs in the last 24 hours: Temp:  [97.5 F (36.4 C)-98.3 F (36.8 C)] 98.3 F (36.8 C) (04/21 1132) Pulse Rate:  [74-114] 74 (04/21 1132) Resp:  [17-18] 17 (04/21 0659) BP: (99-117)/(51-78) 99/52 mmHg (04/21 1132) SpO2:  [93 %-98 %] 97 % (04/21 1132) Weight:  [65.454 kg (144 lb 4.8 oz)] 65.454 kg (144 lb 4.8 oz) (04/21 0659)  Intake/Output from previous day: 04/20 0701 - 04/21 0700 In: 1967.5 [P.O.:1460; I.V.:507.5] Out: 1000 [Urine:1000]  Physical Exam: General appearance: alert, cooperative, appears stated age and no distress Lungs: Bilateral scattered crackles, coarse crackles at bilateral bases. Heart: S1 is variable, S2 is normal. No gallop or murmur appreciated. Abdomen: soft, non-tender; bowel sounds normal; no masses, no organomegaly Extremities: extremities normal, atraumatic, no cyanosis or edema Pulses: 2+ and symmetric Neurologic: Grossly normal.   Lab Results: BMP  Recent Labs  04/17/15 1459 04/18/15 0401 04/19/15 0617  NA 135 138 138  K 3.4* 3.1* 4.4  CL 102 106 107  CO2 '23 21 22  '$ GLUCOSE 338* 286* 258*  BUN '17 14 16  '$ CREATININE 1.46* 1.13* 0.90  CALCIUM 8.5 8.0* 8.6  GFRNONAA 33* 45* 60*  GFRAA 39* 53* 69*    CBC  Recent Labs Lab 04/19/15 0617  WBC 5.8  RBC 3.35*  HGB 10.0*  HCT 28.8*  PLT 126*  MCV 86.0  MCH 29.9  MCHC 34.7  RDW 13.4    HEMOGLOBIN A1C Lab Results  Component Value Date   HGBA1C 9.4* 04/18/2015   MPG 223 04/18/2015    Cardiac Panel (last 3 results) No results for input(s): CKTOTAL, CKMB, TROPONINI, RELINDX in the last 8760 hours.  BNP (last 3 results) No results for input(s): PROBNP in the last 8760 hours.  TSH No results for input(s): TSH in the last 8760 hours.  CHOLESTEROL No results for input(s):  CHOL in the last 8760 hours.  Hepatic Function Panel  Recent Labs  04/18/15 0401  PROT 5.2*  ALBUMIN 3.1*  AST 17  ALT 12  ALKPHOS 72  BILITOT 0.7    Imaging: Imaging results have been reviewed  Cardiac Studies: Echocardiogram 04/19/2015: Normal LV systolic function, moderate LVH.  Cannot evaluate diastolic function due to atrial fibrillation.  Mildly calcified mildly restricted aortic valve with mild aortic stenosis.  Aortic valve area calculated 1.04 cm, peak gradient 26 mmHg, mean gradient 10 mmHg.  Moderate pulmonary hypertension.  Mildly calcified mitral valve.  EKG: Atrial fibrillation with controlled response, nonspecific T abnormality. No evidence of ischemia. Moreland criteria for LVH. Low-voltage complexes. No change from prior EKG. Tele: A. Fib  Assessment/Plan:  1. Dyspnea on exertion, probably related to chronic lung disease, deconditioning, underlying coronary artery disease could also be contributing. 2. Chronic a.fib: CHA2DS2-VASc Score is 6 with yearly risk of stroke of 9.8%. HAS-Bled score is 2 and estimated major bleeding in one year is 1.88-3.2%, currently on Xarelto.Marland Kitchen  Heart rate is well controlled. 3. Chest pain suggestive of chronic stable angina pectoris, class II-III 4. HTN 5. Coronary artery disease, remote coronary angiography had revealed moderate diffuse disease. Medical therapy was recommended. CAD: Mid LAD 50-60%, Diffuse distal LAD disease, D1 ostial 80%, mid 60% stenosis. Normal Circumflex and RCA by Cath 10/07. 6. Acute renal insufficiency, chronic renal insufficiency secondary to diabetes  mellitus, stage III chronic kidney disease. Acute renal injury now resolved with IV hydration. 7. Diabetes mellitus type 2 uncontrolled. 8. Mild aortic stenosis/Aortic regurgitation 9. Chronic Corpulmonale with moderate pulmonary hypertension. 10.  Chronic anemia and chronic, cytopenia, stable  Recommendation: Patient from cardiac standpoint is stable, she  has significant medical morbidities, would stabilize her diabetes, lipids, pulmonary issues, I will consider coronary angiography at some point. No changes in the therapy with regard to cardiac status for now.   Laverda Page, M.D. 04/19/2015, 1:26 PM [facility], [place] Pager: [phone] Office: [phone] If no answer: [phone]

## 2015-04-19 NOTE — Progress Notes (Signed)
[facility] PROGRESS NOTE  [name] FBP:[id number] DOB: 03-19-1934 DOA: 04/17/2015 PCP: Dwan Bolt, MD  Brief Summary  [name] is a 81 y.o. 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  Patient reported that she has been having a worsening shortness of breath with exertion in the past 3 weeks.  She had cough productive of green sputum and intermittent sharp chest pain, which usually last for a few minutes and resolves spontaneously. D-dimer was elevated, but she stated she had been compliant with her xarelto.    Assessment/Plan  Dyspnea:  Patient did not have signs of infection and had been compliant with her xarelto.  BNP was 302, and not have leg edema, less lately to have congestive heart failure.  Initially thought she might of had worsening lung course in the right however her CT scan demonstrated mildly improved to stable lung nodules.   -   CT scan without contrast demonstrated stable to mildly improved bilateral lung nodules , incidentally noted heavy calcification of the coronary arteries and aorta -   Echocardiogram:  EF 60-65% with mild AS, mildly dilated TV, PA peak pressure 75mHg (moderate pulmonary hypertension) -   Cardiology, Dr. GEinar Gip feels that her dyspnea secondary to progressive restrictive lung disease in the setting of multiple comorbidities,  However he will perform cardiac catheterization on 4/22 to exclude CAD -   Recommend outpatient PFTs -   Continue  High-dose statin -  Continue Xarelto, beta blocker -   Telemetry: Normal sinus rhythm -   Continue when necessary albuterol  Lung carcinoid. She has hx of stage IV low grade neuroendocrine tumor favoring  carcinoid (TXNXM1, multiple bilateral lung nodules, diagnosed 11-04 in [facility]). [facility] CPhilbert Risernote on 02/07/13, pt has carcinoid/neuroendocrine tumor, which has been stable. She has an appointment with her oncologist in May or June per patient.   AoCKD-III: Baseline creatinines is about 1.0. Her creatinine is 1.46 on admission, BUN 17.  Trended down with IV fluids -Renal ultrasound without obstruction - continue to hold valsartan  Hypertension,  Low normal blood pressures -Continue Cardizem, sotalol - continue to hold valsartan -  Continue IV prn hydralazine  Hypokalemia: -   Potassium 40 mEq by mouth once  Hyperlipidemia: No LDL record  - increased to high-dose statin given history of CAD  Diabetes mellitus:  A1c 9.4 -   CBG  elevated -   Continue sliding scale insulin -   Continue to hold metformin -  Add levemir 5 units -   Will avoid escalating sliding scale insulins and she will be nothing by mouth for procedure in the morning  Hypothyroidism: TSH was 1.0158 on 03/14/12. -Continue Synthroid  Dysuria: Sometimes the patient has dysuria -  Urinalysis with only 3-6 to be BC and rare bacteria in the presence of squamous epithelials -  She does have some calcium oxalate crystals -   Follow-up urine culture  Atrial Fibrillation: CHA2DS2-VASc Score is 7 , needs oral anticoagulation. Patient is on Xarelto. Heart rate is well controlled. -Continue Xarelto -continue Cardizem and sotalolo  Anemia of chronic disease hemoglobin at baseline -   Trend hemoglobin  Mild thrombocytopenia, appears to be a chronic problem  and likely acute phase reactant -  Trend platelets  Diet:   Diabetic Access:   PIV IVF:   off Proph:   Xarelto  Code Status:  full Family Communication:  Patient alone Disposition Plan:  Pending further improvement in kidney function, HHPT with rolling walker. Cardiac catheterization on  4/22   Consultants:   cardiology, Dr. Einar Gip  Procedures:   chest x-ray   CT chest without contrast   Renal ultrasound  Antibiotics:   none   HPI/Subjective:   continues to have some shortness of breath. Was able to walk some with physical therapy but later today had an episode of shortness of breath after walking from the head of the bed to the foot of the bed. She had to stop to catch her breath.   She feels like she has a tight bra on or a band around her chest all the time. It gets worse when she is exerting himself. Objective: Filed Vitals:   04/19/15 0659 04/19/15 0936 04/19/15 1132 04/19/15 1500  BP: 102/61  99/52 121/78  Pulse: 114  74 72  Temp: 97.8 F (36.6 C)  98.3 F (36.8 C) 97.6 F (36.4 C)  TempSrc: Oral  Oral Oral  Resp: 17     Weight: 65.454 kg (144 lb 4.8 oz)     SpO2: 94% 98% 97% 98%    Intake/Output Summary (Last 24 hours) at 04/19/15 1814 Last data filed at 04/19/15 1812  Gross per 24 hour  Intake   1500 ml  Output   1900 ml  Net   -400 ml   Filed Weights   04/18/15 0609 04/19/15 0659  Weight: 68.9 kg (151 lb 14.4 oz) 65.454 kg (144 lb 4.8 oz)    Exam:   General:   Average weight female, No acute distress  HEENT:  NCAT, MMM  Cardiovascular:  RRR, nl S1, S2 no mrg, 2+ pulses, warm extremities  Respiratory:   Diminished at the bilateral bases, no wheezes or rhonchi, no increased WOB  Abdomen:   NABS, soft, NT/ND  MSK:   Normal tone and bulk,  Trace bilateral LEE  Neuro:  Grossly intact  Data Reviewed: Basic Metabolic Panel:  Recent Labs Lab 04/17/15 1459 04/18/15 0401 04/19/15 0617  NA 135 138 138  K 3.4* 3.1* 4.4  CL 102 106 107  CO2 '23 21 22  '$ GLUCOSE 338* 286* 258*  BUN '17 14 16  '$ CREATININE 1.46* 1.13* 0.90  CALCIUM 8.5 8.0* 8.6   Liver Function Tests:  Recent Labs Lab 04/18/15 0401  AST 17  ALT 12  ALKPHOS 72  BILITOT 0.7  PROT 5.2*  ALBUMIN 3.1*   No results for input(s): LIPASE, AMYLASE in the last  168 hours. No results for input(s): AMMONIA in the last 168 hours. CBC:  Recent Labs Lab 04/17/15 1459 04/18/15 0401 04/19/15 0617  WBC 7.7 4.8 5.8  HGB 10.6* 10.2* 10.0*  HCT 31.2* 29.4* 28.8*  MCV 86.0 85.2 86.0  PLT 151 129* 126*   Cardiac Enzymes: No results for input(s): CKTOTAL, CKMB, CKMBINDEX, TROPONINI in the last 168 hours. BNP (last 3 results)  Recent Labs  04/17/15 1459  BNP 302.0*    ProBNP (last 3 results) No results for input(s): PROBNP in the last 8760 hours.  CBG:  Recent Labs Lab 04/18/15 1633 04/18/15 2130 04/19/15 0707 04/19/15 1144 04/19/15 1629  GLUCAP 114* 232* 229* 266* 228*    No results found for this or any previous visit (from the past 240 hour(s)).   Studies: Ct Chest Wo  Contrast  04/18/2015   CLINICAL DATA:  History of stage IV low-grade neuroendocrine tumor, possible carcinoid. History of multiple pulmonary nodules. Intermittent shortness of breath, worsened with exertion.  EXAM: CT CHEST WITHOUT CONTRAST  TECHNIQUE: Multidetector CT imaging of the chest was performed following the standard protocol without IV contrast.  COMPARISON:  03/13/2012  FINDINGS: Right middle lobe nodule measures 14 mm. When measured in the same planes an at the same level, this is stable. This is measured on image 38. Adjacent right middle lobe nodule on image 33 measures 9 mm, stable. Anterior right upper lobe nodule is on image 36 measures 8 mm, stable. Dominant right lower lobe nodule measures 18 mm on image 40, stable. Multiple left lower lobe nodules. Measures 7 mm on image 36 compared with 9 mm previously. Other smaller nodules in the lower lobes are stable. Few tiny scattered upper lobe nodules are also stable.  Small bilateral pleural effusions. It is difficult to measure and visualize the hilar lymph nodes without intravenous contrast. Small scattered mediastinal lymph nodes, none pathologically enlarged. 10 mm subcarinal lymph node is stable. No axillary  adenopathy.  Aorta is heavily calcified, non aneurysmal. Chest wall soft tissues are unremarkable. Imaging into the upper abdomen shows no acute findings. Prior cholecystectomy  No acute bony abnormality or focal bone lesion.  IMPRESSION: Numerous bilateral pulmonary nodules, most of which are stable. Slight decreased size of 1 of the left lower lobe pulmonary nodules.  Trace bilateral pleural effusions, new since prior study.  Dense coronary artery calcifications and aortic calcifications.   Electronically Signed   By: Rolm Baptise M.D.   On: 04/18/2015 09:18   US Renal  04/18/2015   CLINICAL DATA:  81 year old female with history of acute kidney injury.  EXAM: RENAL/URINARY TRACT ULTRASOUND COMPLETE  COMPARISON:  CT 11/13/2003  FINDINGS: Right Kidney:  Length: 10.2 cm. Echogenicity of the right kidney less than that of the adjacent liver. No evidence of pelvicaliectasis. Rounded anechoic focus in the lateral right kidney measures 11 -12 mm. Posterior acoustic enhancement without internal flow or complexity, compatible with Bosniak 1 cyst. Flow confirmed within the hilum of the right kidney.  Left Kidney:  Length: 10.4 cm. Echogenicity of the left kidney similar to that of the adjacent spleen. No pelvicaliectasis. Flow confirmed in the hilum of the left kidney.  Bladder:  Appears normal for degree of bladder distention.  IMPRESSION: Sonographic survey of the kidneys demonstrates no evidence of obstruction.  Signed,  Dulcy Fanny. Earleen Newport, DO  [facility]  [facility]   Electronically Signed   By: Corrie Mckusick D.O.   On: 04/18/2015 16:37    Scheduled Meds: . atorvastatin  40 mg Oral Daily  . calcium-vitamin D  1 tablet Oral Q breakfast  . colesevelam  625 mg Oral Daily  . dextromethorphan-guaiFENesin  1 tablet Oral BID  . diltiazem  180 mg Oral BID  . insulin aspart  0-9 Units Subcutaneous TID WC  . isosorbide mononitrate  60 mg Oral Daily  . levothyroxine  150  mcg Oral QAC breakfast  . Rivaroxaban  15 mg Oral Q supper  . sodium chloride  3 mL Intravenous Q12H  . sotalol  80 mg Oral BID   Continuous Infusions:   Principal Problem:   SOB (shortness of breath) Active Problems:   Lung cancer   Stroke   Atrial fibrillation   HTN (hypertension)   Hypothyroid   Diabetes mellitus without complication   CAD (coronary  artery disease)   Acute renal failure superimposed on stage 3 chronic kidney disease   Hypokalemia    Time spent: 30 min    Charbel Los, [place] [facility] Pager [phone]. If 7PM-7AM, please contact night-coverage at [email or web address], password [facility] 04/19/2015, 6:14 PM  LOS: 2 days

## 2015-04-19 NOTE — Progress Notes (Signed)
VASCULAR LAB PRELIMINARY  ARTERIAL  ABI completed:WNL    RIGHT    LEFT    PRESSURE WAVEFORM  PRESSURE WAVEFORM  BRACHIAL 113 T BRACHIAL 122 T  DP   DP    AT 131 B AT 120 B  PT 138 B PT 124 B  PER   PER    GREAT TOE  NA GREAT TOE  NA    RIGHT LEFT  ABI >1 >1     Markesha Hannig, RVT 04/19/2015, 3:25 PM

## 2015-04-20 ENCOUNTER — Encounter (HOSPITAL_COMMUNITY)
Admission: EM | Disposition: A | Discharge status: Home / Self Care | Payer: Self-pay | Source: Home / Self Care | Attending: Emergency Medicine

## 2015-04-20 ENCOUNTER — Encounter (HOSPITAL_COMMUNITY): Payer: Self-pay | Admitting: Cardiology

## 2015-04-20 DIAGNOSIS — R0602 Shortness of breath: Secondary | ICD-10-CM | POA: Diagnosis not present

## 2015-04-20 DIAGNOSIS — N183 Chronic kidney disease, stage 3 (moderate): Secondary | ICD-10-CM | POA: Diagnosis not present

## 2015-04-20 DIAGNOSIS — R0609 Other forms of dyspnea: Secondary | ICD-10-CM | POA: Diagnosis not present

## 2015-04-20 DIAGNOSIS — R5381 Other malaise: Secondary | ICD-10-CM | POA: Diagnosis not present

## 2015-04-20 DIAGNOSIS — E119 Type 2 diabetes mellitus without complications: Secondary | ICD-10-CM | POA: Diagnosis not present

## 2015-04-20 DIAGNOSIS — I2510 Atherosclerotic heart disease of native coronary artery without angina pectoris: Secondary | ICD-10-CM | POA: Diagnosis not present

## 2015-04-20 DIAGNOSIS — N179 Acute kidney failure, unspecified: Secondary | ICD-10-CM | POA: Diagnosis not present

## 2015-04-20 HISTORY — PX: LEFT HEART CATHETERIZATION WITH CORONARY ANGIOGRAM: SHX5451

## 2015-04-20 LAB — CBC
HCT: 29.8 % — ABNORMAL LOW (ref 36.0–46.0)
Hemoglobin: 10.3 g/dL — ABNORMAL LOW (ref 12.0–15.0)
MCH: 29.6 pg (ref 26.0–34.0)
MCHC: 34.6 g/dL (ref 30.0–36.0)
MCV: 85.6 fL (ref 78.0–100.0)
PLATELETS: 132 10*3/uL — AB (ref 150–400)
RBC: 3.48 MIL/uL — ABNORMAL LOW (ref 3.87–5.11)
RDW: 13.3 % (ref 11.5–15.5)
WBC: 6.8 10*3/uL (ref 4.0–10.5)

## 2015-04-20 LAB — GLUCOSE, CAPILLARY
GLUCOSE-CAPILLARY: 191 mg/dL — AB (ref 70–99)
Glucose-Capillary: 196 mg/dL — ABNORMAL HIGH (ref 70–99)
Glucose-Capillary: 177 mg/dL — ABNORMAL HIGH (ref 70–99)

## 2015-04-20 LAB — BASIC METABOLIC PANEL
Anion gap: 9 (ref 5–15)
BUN: 20 mg/dL (ref 6–23)
CALCIUM: 8.6 mg/dL (ref 8.4–10.5)
CO2: 23 mmol/L (ref 19–32)
Chloride: 106 mmol/L (ref 96–112)
Creatinine, Ser: 1.04 mg/dL (ref 0.50–1.10)
GFR calc Af Amer: 58 mL/min — ABNORMAL LOW (ref >90)
GFR calc non Af Amer: 50 mL/min — ABNORMAL LOW (ref >90)
Glucose, Bld: 219 mg/dL — ABNORMAL HIGH (ref 70–99)
Potassium: 4.2 mmol/L (ref 3.5–5.1)
Sodium: 138 mmol/L (ref 135–145)

## 2015-04-20 LAB — URINE CULTURE

## 2015-04-20 SURGERY — LEFT HEART CATHETERIZATION WITH CORONARY ANGIOGRAM
Anesthesia: LOCAL

## 2015-04-20 MED ORDER — SODIUM CHLORIDE 0.9 % IV BOLUS (SEPSIS)
1000.0000 mL | Freq: Once | INTRAVENOUS | Status: AC
  Administered 2015-04-20: 1000 mL via INTRAVENOUS

## 2015-04-20 MED ORDER — HEPARIN SODIUM (PORCINE) 1000 UNIT/ML IJ SOLN
INTRAMUSCULAR | Status: AC
Start: 2015-04-20 — End: 2015-04-20
  Filled 2015-04-20: qty 1

## 2015-04-20 MED ORDER — SODIUM CHLORIDE 0.9 % IJ SOLN: 3.0000 mL | Freq: Two times a day (BID) | INTRAMUSCULAR | Status: DC

## 2015-04-20 MED ORDER — MIDAZOLAM HCL 2 MG/2ML IJ SOLN
INTRAMUSCULAR | Status: AC
  Filled 2015-04-20: qty 2

## 2015-04-20 MED ORDER — SODIUM CHLORIDE 0.9 % IJ SOLN: 3.0000 mL | INTRAMUSCULAR | Status: DC | PRN

## 2015-04-20 MED ORDER — SODIUM CHLORIDE 0.9 % IV SOLN: 250.0000 mL | INTRAVENOUS | Status: DC | PRN

## 2015-04-20 MED ORDER — SODIUM CHLORIDE 0.9 % IV SOLN
INTRAVENOUS | Status: AC
  Administered 2015-04-20: 09:00:00 via INTRAVENOUS

## 2015-04-20 MED ORDER — ATORVASTATIN CALCIUM 40 MG PO TABS: 40.0000 mg | ORAL_TABLET | Freq: Every day | ORAL | Status: DC

## 2015-04-20 MED ORDER — ISOSORBIDE MONONITRATE ER 60 MG PO TB24: 60.0000 mg | ORAL_TABLET | Freq: Every day | ORAL | Status: DC

## 2015-04-20 MED ORDER — DILTIAZEM HCL ER COATED BEADS 180 MG PO CP24: 180.0000 mg | ORAL_CAPSULE | Freq: Every day | ORAL | Status: DC

## 2015-04-20 MED ORDER — FENTANYL CITRATE (PF) 100 MCG/2ML IJ SOLN
INTRAMUSCULAR | Status: AC
  Filled 2015-04-20: qty 2

## 2015-04-20 MED ORDER — HEPARIN (PORCINE) IN NACL 2-0.9 UNIT/ML-% IJ SOLN
INTRAMUSCULAR | Status: AC
  Filled 2015-04-20: qty 1500

## 2015-04-20 MED ORDER — VERAPAMIL HCL 2.5 MG/ML IV SOLN
INTRAVENOUS | Status: AC
  Filled 2015-04-20: qty 2

## 2015-04-20 MED ORDER — NITROGLYCERIN 1 MG/10 ML FOR IR/CATH LAB
INTRA_ARTERIAL | Status: AC
  Filled 2015-04-20: qty 10

## 2015-04-20 MED ORDER — LIDOCAINE HCL (PF) 1 % IJ SOLN
INTRAMUSCULAR | Status: AC
  Filled 2015-04-20: qty 30

## 2015-04-20 MED ORDER — DILTIAZEM HCL ER COATED BEADS 180 MG PO CP24
180.0000 mg | ORAL_CAPSULE | Freq: Every day | ORAL | Status: DC
Start: 2015-04-20 — End: 2016-12-17

## 2015-04-20 NOTE — Clinical Documentation Improvement (Addendum)
"  Dyspnea secondary to progressive restrictive lung disease in the setting of multiple comorbidities" is documented in the current medical record.  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  Left Heart Cath results from 04/20/15 - no significant change in the anatomy from 2007, it does not explain shortness of breath, aortic stenosis is also very mild.   If possible and appropriate, please provide a more specific diagnosis and/or cause for the patient's  "restrictive lung disease":   - Restrictive Lung Disease 2/2 neuroendocrine lung tumor  - Restrictive Lung Disease 2/2 ........................................  - Other Condition  - Unable to clinically determine and the restrictive lung disease cannot be further clarified.   Thank You, Erling Conte ,RN Clinical Documentation Specialist:  [phone] [place] Information Management

## 2015-04-20 NOTE — Progress Notes (Signed)
Walked in patient's room and noticed telemetry box alarming. Called CCMD to verify if patient was coming in on the monitor. Stated her name to CCMD twice and they stated back to me that she was coming in and her rhythm was fine.

## 2015-04-20 NOTE — Progress Notes (Signed)
Occupational Therapy Treatment Patient Details Name: [name] MRN: [id number] DOB: 1934-04-15 Today's Date: 04/20/2015    History of present illness Pt is a 81 y.o. 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    OT comments  Pt progressing toward goals still needs Min guard during ADL's for safety due to decreased endurance. Pt checked SPO2 during bath due to SOB and practiced pursed lip breathing O2 levels at 94. Acute OT to continue with POC.   Follow Up Recommendations  No OT follow up;Supervision - Intermittent    Equipment Recommendations  3 in 1 bedside comode    Recommendations for Other Services      Precautions / Restrictions Precautions Precautions: Fall Precaution Comments: pt reports she has not fallen recently (past year) Restrictions Weight Bearing Restrictions: No       Mobility Bed Mobility Overal bed mobility: Modified Independent             General bed mobility comments: use of rail for OOB  Transfers Overall transfer level: Needs assistance Equipment used: Rolling walker (2 wheeled) Transfers: Sit to/from Stand Sit to Stand: Min guard         General transfer comment: min-guard for safety as pt immediately reaches for stable surface to hold which affects posture, enforcing flexed trunk        ADL Overall ADL's : Needs assistance/impaired     Grooming: Wash/dry hands;Wash/dry face;Oral care;Set up;Sitting;Standing   Upper Body Bathing: Set up;Sitting   Lower Body Bathing: Set up;Sit to/from stand;Sitting/lateral leans;Supervison/ safety   Upper Body Dressing : Set up;Sitting       Toilet Transfer: Min guard;Ambulation;RW           Functional mobility during ADLs: Rolling walker;Min guard General ADL Comments: Pt. able to bathe  sit/stand at sink needed rest break due to SOB SPO2 94% after bath                 Cognition  Arousal/Alertness: Awake/Alert Behavior During Therapy: WFL for tasks assessed/performed Overall Cognitive Status: Within Functional Limits for tasks assessed                                    Pertinent Vitals/ Pain       Pain Assessment: Faces Faces Pain Scale: Hurts a little bit Pain Location: Pain in IV on L arm Pain Descriptors / Indicators: Discomfort Pain Intervention(s): Monitored during session         Frequency Min 2X/week     Progress Toward Goals  OT Goals(current goals can now be found in the care plan section)  Progress towards OT goals: Progressing toward goals  Acute Rehab OT Goals Patient Stated Goal: home tomorrow  Plan Discharge plan needs to be updated       End of Session Equipment Utilized During Treatment: Rolling walker;Oxygen   Activity Tolerance Patient tolerated treatment well   Patient Left in chair;with call bell/phone within reach   Nurse Communication      Functional Assessment Tool Used: clinical judgement Functional Limitation: Self care Self Care Current Status (W0981): At least 1 percent but less than 20 percent impaired, limited or restricted Self Care Goal Status (X9147): 0 percent impaired, limited or restricted   Time: 8295-6213 OT  Time Calculation (min): 32 min  Charges: OT G-codes **NOT FOR INPATIENT CLASS** Functional Assessment Tool Used: clinical judgement Functional Limitation: Self care Self Care Current Status (B0962): At least 1 percent but less than 20 percent impaired, limited or restricted Self Care Goal Status (E3662): 0 percent impaired, limited or restricted OT General Charges $OT Visit: 1 Procedure OT Treatments $Self Care/Home Management : 23-37 mins  Juluis Rainier 04/20/2015, 4:08 PM  Cyndie Chime, OTR/L Occupational Therapist [phone] (pager)

## 2015-04-20 NOTE — Interval H&P Note (Signed)
History and Physical Interval Note:  04/20/2015 7:53 AM  [name]  has presented today for surgery, with the diagnosis of cp  The various methods of treatment have been discussed with the patient and family. After consideration of risks, benefits and other options for treatment, the patient has consented to  Procedure(s): LEFT HEART CATHETERIZATION WITH CORONARY ANGIOGRAM (N/A) and possible  as a surgical intervention .  The patient's history has been reviewed, patient examined, no change in status, stable for surgery.  I have reviewed the patient's chart and labs.  Questions were answered to the patient's satisfaction.    Patient has [place] equivocal symptoms and unable to be explained by her lung disease. Due to underlying CV risks and known CAD, will proceed with coronary angio and possible PCI urgently.Cath Lab Visit (complete for each Cath Lab visit)  Clinical Evaluation Leading to the Procedure:   ACS: Yes.    Non-ACS:    Anginal Classification: CCS IV  Anti-ischemic medical therapy: Maximal Therapy (2 or more classes of medications)  Non-Invasive Test Results: No non-invasive testing performed  Prior CABG: No previous CABG         [facility]

## 2015-04-20 NOTE — CV Procedure (Signed)
Procedure performed:  Left heart catheterization including hemodynamic monitoring of the left ventricle, LV gram, selective right and left coronary arteriography.  Indication patient is a 81 year-old Caucasian female with history of hypertension,  hyperlipidemia,  Diabetes Mellitus, history of moderate coronary artery disease by coronary angiography sometime in 2006, who presents with shortness of breath and dyspnea on exertion, she also complaints of chest pain suggestive of angina pectoris. Due to worsening dyspnea, patient unable to perform given walking within her own hospital room, felt that coronary artery disease needs to be excluded. Hence patient was brought to the angiography suite to evaluate her coronary anatomy.   Hemodynamic data:  Left ventricular pressure was 145/17 with LVEDP of 25 mm mercury. Aortic pressure was 122/70 with a mean of 93 mm mercury. There was a 12 mmHg pressure gradient across the aortic valve suggestive of very mild aortic stenosis.  Left ventricle: Performed in the RAO projection revealed LVEF of 65 %. There was no significant MR. No wall motion abnormality.  Right coronary artery: Very large caliber vessel, diffusely calcified with mild diffuse luminal irregularity. No high-grade stenosis is evident. There is a separate origin of a conus branch superior to the RCA origin.  Left main coronary artery is large mild calcification evident, distal left main shows a 10-20% lesion stenosis.  Circumflex coronary artery: It is a moderate caliber vessel, ostium has a 20-30% stenosis, mid segment has a 30% stenosis.  LAD:  LAD gives origin to a small D1 and a large diagonal-2, D2 has ostial 80% stenosis. D1 has ostial 90% stenosis. D1 is very small for angioplasty, D2 stenosis unchanged from 2007. The mid LAD at the origin of D2 has a 50% stenosis and the mid to distal LAD has a 40-50% stenosis, again not significantly changed from 2007.  LAD has mild diffuse luminal  irregularities and diffuse calcification.   Impression: Coronary artery disease involving a moderate sized D2 of significance. However there is no significant change in the anatomy from 2007, it does not explain class for shortness of breath. Aortic stenosis is also very mild with a 12 mmHg pressure gradient, consistent with echo findings.  Recommend cardiopulmonary rehabilitation for improving her dyspnea. I suspect the lung disease is leading to her dyspnea along with deconditioning. Coronary artery disease is just a contributor on the sidelines. A total of 50 mL of contrast was utilized for diagnostic angiography.  Technique: Under sterile precautions using a 6 French right radial  arterial access, a 6 French sheath was introduced into the right radial artery. A 5 [place] Tig 4 catheter was advanced into the ascending aorta selective  right coronary artery and left coronary artery was cannulated and angiography was performed in multiple views. The catheter was pulled back Out of the body over exchange length J-wire.  Samer was used to perform LV gram which was performed in RAO projection. Catheter exchanged out of the body over J-Wire. NO immediate complications noted. Patient tolerated the procedure well.

## 2015-04-20 NOTE — H&P (View-Only) (Signed)
Subjective:  Feels better today and ate all her breakfast. No chest pain and dyspnea improved. States she was not comfortable at night due to alarms going off and dis not sleep well.  Objective:  Vital Signs in the last 24 hours: Temp:  [97.5 F (36.4 C)-98.3 F (36.8 C)] 98.3 F (36.8 C) (04/21 1132) Pulse Rate:  [74-114] 74 (04/21 1132) Resp:  [17-18] 17 (04/21 0659) BP: (99-117)/(51-78) 99/52 mmHg (04/21 1132) SpO2:  [93 %-98 %] 97 % (04/21 1132) Weight:  [65.454 kg (144 lb 4.8 oz)] 65.454 kg (144 lb 4.8 oz) (04/21 0659)  Intake/Output from previous day: 04/20 0701 - 04/21 0700 In: 1967.5 [P.O.:1460; I.V.:507.5] Out: 1000 [Urine:1000]  Physical Exam: General appearance: alert, cooperative, appears stated age and no distress Lungs: Bilateral scattered crackles, coarse crackles at bilateral bases. Heart: S1 is variable, S2 is normal. No gallop or murmur appreciated. Abdomen: soft, non-tender; bowel sounds normal; no masses, no organomegaly Extremities: extremities normal, atraumatic, no cyanosis or edema Pulses: 2+ and symmetric Neurologic: Grossly normal.   Lab Results: BMP  Recent Labs  04/17/15 1459 04/18/15 0401 04/19/15 0617  NA 135 138 138  K 3.4* 3.1* 4.4  CL 102 106 107  CO2 '23 21 22  '$ GLUCOSE 338* 286* 258*  BUN '17 14 16  '$ CREATININE 1.46* 1.13* 0.90  CALCIUM 8.5 8.0* 8.6  GFRNONAA 33* 45* 60*  GFRAA 39* 53* 69*    CBC  Recent Labs Lab 04/19/15 0617  WBC 5.8  RBC 3.35*  HGB 10.0*  HCT 28.8*  PLT 126*  MCV 86.0  MCH 29.9  MCHC 34.7  RDW 13.4    HEMOGLOBIN A1C Lab Results  Component Value Date   HGBA1C 9.4* 04/18/2015   MPG 223 04/18/2015    Cardiac Panel (last 3 results) No results for input(s): CKTOTAL, CKMB, TROPONINI, RELINDX in the last 8760 hours.  BNP (last 3 results) No results for input(s): PROBNP in the last 8760 hours.  TSH No results for input(s): TSH in the last 8760 hours.  CHOLESTEROL No results for input(s):  CHOL in the last 8760 hours.  Hepatic Function Panel  Recent Labs  04/18/15 0401  PROT 5.2*  ALBUMIN 3.1*  AST 17  ALT 12  ALKPHOS 72  BILITOT 0.7    Imaging: Imaging results have been reviewed  Cardiac Studies: Echocardiogram 04/19/2015: Normal LV systolic function, moderate LVH.  Cannot evaluate diastolic function due to atrial fibrillation.  Mildly calcified mildly restricted aortic valve with mild aortic stenosis.  Aortic valve area calculated 1.04 cm, peak gradient 26 mmHg, mean gradient 10 mmHg.  Moderate pulmonary hypertension.  Mildly calcified mitral valve.  EKG: Atrial fibrillation with controlled response, nonspecific T abnormality. No evidence of ischemia. Moreland criteria for LVH. Low-voltage complexes. No change from prior EKG. Tele: A. Fib  Assessment/Plan:  1. Dyspnea on exertion, probably related to chronic lung disease, deconditioning, underlying coronary artery disease could also be contributing. 2. Chronic a.fib: CHA2DS2-VASc Score is 6 with yearly risk of stroke of 9.8%. HAS-Bled score is 2 and estimated major bleeding in one year is 1.88-3.2%, currently on Xarelto.Marland Kitchen  Heart rate is well controlled. 3. Chest pain suggestive of chronic stable angina pectoris, class II-III 4. HTN 5. Coronary artery disease, remote coronary angiography had revealed moderate diffuse disease. Medical therapy was recommended. CAD: Mid LAD 50-60%, Diffuse distal LAD disease, D1 ostial 80%, mid 60% stenosis. Normal Circumflex and RCA by Cath 10/07. 6. Acute renal insufficiency, chronic renal insufficiency secondary to diabetes  mellitus, stage III chronic kidney disease. Acute renal injury now resolved with IV hydration. 7. Diabetes mellitus type 2 uncontrolled. 8. Mild aortic stenosis/Aortic regurgitation 9. Chronic Corpulmonale with moderate pulmonary hypertension. 10.  Chronic anemia and chronic, cytopenia, stable  Recommendation: Patient from cardiac standpoint is stable, she  has significant medical morbidities, would stabilize her diabetes, lipids, pulmonary issues, I will consider coronary angiography at some point. No changes in the therapy with regard to cardiac status for now.   Laverda Page, M.D. 04/19/2015, 1:26 PM [facility], [place] Pager: [phone] Office: [phone] If no answer: [phone]

## 2015-04-20 NOTE — Discharge Summary (Addendum)
Physician Discharge Summary  [name] WJX:[id number] DOB: 1934-02-16 DOA: 04/17/2015  PCP: Dwan Bolt, MD  Admit date: 04/17/2015 Discharge date: 04/20/2015  Recommendations for Outpatient Follow-up:   1. F/u with Dr. Wilson Singer on Monday for repeat creatinine.  Please refer for PFTs.  Please address ARB and CCB.  I held her metformin due to contrast and this will need to be restarted on Monday, but her A1c is still elevated.  Consider increasing her metformin or adding additional agent. 2. HH RN and PT, rolling walker, and 3-in-1  Discharge Diagnoses:  Principal Problem:   Physical deconditioning Active Problems:   Carcinoid tumor of lung   Stroke   Atrial fibrillation   HTN (hypertension)   Hypothyroid   DM (diabetes mellitus), type 2, uncontrolled, with renal complications   CAD (coronary artery disease)   SOB (shortness of breath)   Acute renal failure superimposed on stage 3 chronic kidney disease   Hypokalemia   Discharge Condition: stable, minimally improved  Diet recommendation:  Diabetic, healthy heart  Wt Readings from Last 3 Encounters:  04/20/15 65.862 kg (145 lb 3.2 oz)  05/10/13 55.792 kg (123 lb)  03/29/13 56.7 kg (125 lb)    History of present illness:  [name] is a 81 y.o. 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 Patient reported that she has been having a worsening shortness of breath with exertion in the past 3 weeks. She had cough productive of green sputum and intermittent sharp chest pain, which usually last for a few minutes and resolves spontaneously. D-dimer was elevated, but she stated she had been compliant with her xarelto.   Hospital  Course:   Progressive dyspnea: Patient did not have signs of infection and had been compliant with her xarelto. BNP was 302, and she did not have leg edema.  She appeared somewhat dehydrated at admission so congestive heart failure was felt to be less likely. Initially thought she might of had worsening cardinoid however her CT scan chest demonstrated mildly improved to stable lung nodules. She was incidentally noted to have heavy calcification of the coronary arteries and aorta.  Echocardiogram: EF 60-65% with mild AS, mildly dilated TV, PA peak pressure 53mHg (moderate pulmonary hypertension), but nothing that would explain her significant DOE.  She was seen by cardiology and underwent cardiac catherization on 4/22 to exclude CAD, however, her cath findings were similar to her previous cath in 2007 with nonobstructive disease.  She likely has some deconditioning responsible for her worsening dyspnea.  Per Dr. GEinar Gip he feels she may be developing some restrictive lung disease. - Recommend outpatient PFTs  Moderate pulmonary hypertension, 497mg, followed by cardiology/  Lung carcinoid. 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 [facility], pt has carcinoid/neuroendocrine tumor, which has been stable. She has an appointment with her oncologist in May or June.   AoCKD-III: Baseline creatinines is about 1.0. Her creatinine is 1.46 on admission, BUN 17.  Trended down with IV fluids -Renal ultrasound without obstruction - held her valsartan and continued to hold at discharge secondary to her catheterization on 4/22.  If her creatinine remains stable on Monday  and her blood pressure is higher, consider resuming.    Hypertension,low normal blood pressures -  Reduced Cardizem to once daily  -  continued sotalol -  held  valsartan  Hyperlipidemia: No LDL record  - increased to high-dose statin given history of CAD  Diabetes mellitus: A1c 9.4 - given SSI -  Asked her to hold her metformin until Monday due to contrast exposure  Hypothyroidism: TSH was 1.0158 on 03/14/12. -Continued Synthroid  Dysuria: Sometimes the patient has dysuria - Urinalysis with only 3-6 to be [facility] and rare bacteria in the presence of squamous epithelials - urine culture negative  Atrial Fibrillation: CHA2DS2-VASc Score is 7 , needs oral anticoagulation. Patient is on Xarelto. Heart rate is well controlled. -Continued Xarelto -reduced Cardizem due to low blood pressure and continued sotalol  Hypokalemia, resolved with oral potassium repletion  Anemia of chronic disease hemoglobin at baseline of '10mg'$ /dl  Mild thrombocytopenia, appears to be a chronic problem and acute phase reactant, stable around 120-130   Consultants:  cardiology, Dr. Einar Gip  Procedures:  Chest x-ray  CT chest without contrast:  See below  Renal ultrasound:  No obstruction  Cardiac catheterization 4/22:  Nonobstructive CAD  Antibiotics:  none  Discharge Exam: Filed Vitals:   04/20/15 1409  BP: 104/69  Pulse: 74  Temp: 97.3 F (36.3 C)  Resp:    Filed Vitals:   04/20/15 1100 04/20/15 1110 04/20/15 1200 04/20/15 1409  BP: 122/72 122/72 125/63 104/69  Pulse:  85 79 74  Temp:    97.3 F (36.3 C)  TempSrc:    Oral  Resp:      Weight:      SpO2:  97% 97% 99%     General: Average weight female, No acute distress  HEENT: NCAT, MMM  Cardiovascular: RRR, nl S1, S2 no mrg, 2+ pulses, warm extremities  Respiratory: Diminished at the bilateral bases, no wheezes or rhonchi, no increased WOB  Abdomen: NABS, soft, NT/ND  MSK: Normal tone and bulk,trace bilateral LEE  Skin:  Pressure dressing intact on wrist with < 2sec CR in fingers, no bleeding  Discharge Instructions      Discharge Instructions    Call MD  for:  difficulty breathing, headache or visual disturbances    Complete by:  As directed      Call MD for:  extreme fatigue    Complete by:  As directed      Call MD for:  hives    Complete by:  As directed      Call MD for:  persistant dizziness or light-headedness    Complete by:  As directed      Call MD for:  persistant nausea and vomiting    Complete by:  As directed      Call MD for:  redness, tenderness, or signs of infection (pain, swelling, redness, odor or green/yellow discharge around incision site)    Complete by:  As directed      Call MD for:  severe uncontrolled pain    Complete by:  As directed      Call MD for:  temperature >100.4    Complete by:  As directed      Diet - low sodium heart healthy    Complete by:  As directed      Diet Carb Modified    Complete by:  As directed      Discharge instructions    Complete by:  As directed   You were hospitalized with  shortness of breath.  You should have your primary care doctor refer you for lung function tests.  Please continue physical therapy and try to stay active.  Because of low blood pressures, I have reduced your cardizem dose to once daily for the next few days.  Please have your blood pressure repeated at your follow up appointment next week.  If your blood pressure is better, you should resume your previous dose of cardizem so that your heart rate from your atrial fibrillation does not speed up.  Because your kidneys were not working very well when you were admitted, I have stopped your valsartan and your metformin.  Please restart your metformin on Monday.   Do not restart your valsartan until you are directed to Dr. Wilson Singer or by Dr. Einar Gip.  You have some heart disease and so you should be on a higher dose of your cholesterol medication atorvastatin to help dissolve the blockages.  Dr. Einar Gip recommended trying a medication called imdur to see if that helps your breathing.  Please stay hydrated and have your primary care  doctor repeat your labs on Monday if possible.  If you have worsening chest pain with shortness of breath or other concerning symptoms, please return to the hospital as soon as possible.  Please do not take any over the counter pain medication except for tylenol because of your kidney disease.     Increase activity slowly    Complete by:  As directed             Medication List    STOP taking these medications        metFORMIN 500 MG (MOD) 24 hr tablet  Commonly known as:  GLUMETZA     traMADol 50 MG tablet  Commonly known as:  ULTRAM     valsartan 320 MG tablet  Commonly known as:  DIOVAN      TAKE these medications        acetaminophen 500 MG tablet  Commonly known as:  TYLENOL  Take 1,000 mg by mouth every 6 (six) hours as needed for pain.     atorvastatin 40 MG tablet  Commonly known as:  LIPITOR  Take 1 tablet (40 mg total) by mouth daily.     Calcium-Vitamin D3 600-500 MG-UNIT Caps  Take 1 capsule by mouth daily.     colesevelam 625 MG tablet  Commonly known as:  WELCHOL  Take 625 mg by mouth daily.     diltiazem 180 MG 24 hr capsule  Commonly known as:  CARDIZEM CD  Take 1 capsule (180 mg total) by mouth daily.     isosorbide mononitrate 60 MG 24 hr tablet  Commonly known as:  IMDUR  Take 1 tablet (60 mg total) by mouth daily.     levothyroxine 150 MCG tablet  Commonly known as:  SYNTHROID, LEVOTHROID  Take 150 mcg by mouth daily.     MAGNESIUM-VITAMIN D3-TURMERIC PO  Take 1 tablet by mouth daily.     meclizine 25 MG tablet  Commonly known as:  ANTIVERT  Take 25 mg by mouth daily as needed for dizziness.     polyvinyl alcohol 1.4 % ophthalmic solution  Commonly known as:  LIQUIFILM TEARS  Place 2 drops into both eyes as needed for dry eyes.     Rivaroxaban 15 MG Tabs tablet  Commonly known as:  XARELTO  Take 15 mg by mouth daily with supper.     sotalol 80 MG tablet  Commonly known  as:  BETAPACE  Take 80 mg by mouth 2 (two) times daily.      SYSTANE 0.4-0.3 % Soln  Generic drug:  Polyethyl Glycol-Propyl Glycol  Apply 2 drops to eye 2 (two) times daily as needed (dry eyes).     WOMENS BONE HEALTH PO  Take 1 tablet by mouth daily.       Follow-up Information    Follow up with Dwan Bolt, MD. Schedule an appointment as soon as possible for a visit on 04/23/2015.   Specialty:  Endocrinology   Contact information:   [address] [place] [address] [phone]       Follow up with Laverda Page, MD In 1 month.   Specialty:  Cardiology   Why:  As needed   Contact information:   [place] [address] [phone]        The results of significant diagnostics from this hospitalization (including imaging, microbiology, ancillary and laboratory) are listed below for reference.    Significant Diagnostic Studies: Dg Chest 2 View  04/17/2015   CLINICAL DATA:  Shortness of breath and weakness. History of lung cancer.  EXAM: CHEST  2 VIEW  COMPARISON:  03/29/2015  FINDINGS: Heart size is mildly enlarged without evidence for edema or focal pulmonary opacity. Curvilinear left basilar scarring is noted. Mild hyperaeration suggesting COPD is stable. Remote left posterior rib fractures are reidentified. No pleural effusion. No pneumothorax. Bones are subjectively osteopenic.  IMPRESSION: No active cardiopulmonary disease.   Electronically Signed   By: Conchita Paris M.D.   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 History of multiple pulmonary nodules. Intermittent shortness of breath, worsened with exertion.  EXAM: CT CHEST WITHOUT CONTRAST  TECHNIQUE: Multidetector CT imaging of the chest was performed following the standard protocol without IV contrast.  COMPARISON:  03/13/2012  FINDINGS: Right middle lobe nodule measures 14 mm. When measured in the same planes an at the  same level, this is stable. This is measured on image 38. Adjacent right middle lobe nodule on image 33 measures 9 mm, stable. Anterior right upper lobe nodule is on image 36 measures 8 mm, stable. Dominant right lower lobe nodule measures 18 mm on image 40, stable. Multiple left lower lobe nodules. Measures 7 mm on image 36 compared with 9 mm previously. Other smaller nodules in the lower lobes are stable. Few tiny scattered upper lobe nodules are also stable.  Small bilateral pleural effusions. It is difficult to measure and visualize the hilar lymph nodes without intravenous contrast. Small scattered mediastinal lymph nodes, none pathologically enlarged. 10 mm subcarinal lymph node is stable. No axillary adenopathy.  Aorta is heavily calcified, non aneurysmal. Chest wall soft tissues are unremarkable. Imaging into the upper abdomen shows no acute findings. Prior cholecystectomy  No acute bony abnormality or focal bone lesion.  IMPRESSION: Numerous bilateral pulmonary nodules, most of which are stable. Slight decreased size of 1 of the left lower lobe pulmonary nodules.  Trace bilateral pleural effusions, new since prior study.  Dense coronary artery calcifications and aortic calcifications.   Electronically Signed   By: Rolm Baptise M.D.   On: 04/18/2015 09:18   US Renal  04/18/2015   CLINICAL DATA:  81 year old female with history of acute kidney injury.  EXAM: RENAL/URINARY TRACT ULTRASOUND COMPLETE  COMPARISON:  CT 11/13/2003  FINDINGS: Right Kidney:  Length:  10.2 cm. Echogenicity of the right kidney less than that of the adjacent liver. No evidence of pelvicaliectasis. Rounded anechoic focus in the lateral right kidney measures 11 -12 mm. Posterior acoustic enhancement without internal flow or complexity, compatible with Bosniak 1 cyst. Flow confirmed within the hilum of the right kidney.  Left Kidney:  Length: 10.4 cm. Echogenicity of the left kidney similar to that of the adjacent spleen. No  pelvicaliectasis. Flow confirmed in the hilum of the left kidney.  Bladder:  Appears normal for degree of bladder distention.  IMPRESSION: Sonographic survey of the kidneys demonstrates no evidence of obstruction.  Signed,  Dulcy Fanny. Earleen Newport, DO  [facility]  [facility]   Electronically Signed   By: Corrie Mckusick D.O.   On: 04/18/2015 16:37    Microbiology: Recent Results (from the past 240 hour(s))  Urine culture     Status: None   Collection Time: 04/18/15  4:44 PM  Result Value Ref Range Status   Specimen Description URINE, RANDOM  Final   Special Requests NONE  Final   Colony Count   Final    3,000 COLONIES/ML Performed at [facility]    Culture   Final    INSIGNIFICANT GROWTH Performed at [facility]    Report Status 04/20/2015 FINAL  Final     Labs: Basic Metabolic Panel:  Recent Labs Lab 04/17/15 1459 04/18/15 0401 04/19/15 0617 04/20/15 0600  NA 135 138 138 138  K 3.4* 3.1* 4.4 4.2  CL 102 106 107 106  CO2 '23 21 22 23  '$ GLUCOSE 338* 286* 258* 219*  BUN '17 14 16 20  '$ CREATININE 1.46* 1.13* 0.90 1.04  CALCIUM 8.5 8.0* 8.6 8.6   Liver Function Tests:  Recent Labs Lab 04/18/15 0401  AST 17  ALT 12  ALKPHOS 72  BILITOT 0.7  PROT 5.2*  ALBUMIN 3.1*   No results for input(s): LIPASE, AMYLASE in the last 168 hours. No results for input(s): AMMONIA in the last 168 hours. CBC:  Recent Labs Lab 04/17/15 1459 04/18/15 0401 04/19/15 0617 04/20/15 0600  WBC 7.7 4.8 5.8 6.8  HGB 10.6* 10.2* 10.0* 10.3*  HCT 31.2* 29.4* 28.8* 29.8*  MCV 86.0 85.2 86.0 85.6  PLT 151 129* 126* 132*   Cardiac Enzymes: No results for input(s): CKTOTAL, CKMB, CKMBINDEX, TROPONINI in the last 168 hours. BNP: BNP (last 3 results)  Recent Labs  04/17/15 1459  BNP 302.0*    ProBNP (last 3 results) No results for input(s): PROBNP in the last 8760 hours.  CBG:  Recent Labs Lab 04/19/15 1144 04/19/15 1629  04/19/15 2147 04/20/15 0653 04/20/15 1121  GLUCAP 266* 228* 229* 191* 196*    Time coordinating discharge: 45 minutes  Signed:  Kayda Allers  [facility] 04/20/2015, 5:02 PM

## 2015-04-20 NOTE — Progress Notes (Signed)
DC IV, DC Tele, DC Home. Discharge instructions and home medications discussed with patient and patient's husband. Patient and patient's husband denied any questions or concerns at this time. Patient leaving unit via wheelchair and appears in no acute distress.

## 2015-04-25 DIAGNOSIS — E118 Type 2 diabetes mellitus with unspecified complications: Secondary | ICD-10-CM | POA: Diagnosis not present

## 2015-04-25 DIAGNOSIS — I10 Essential (primary) hypertension: Secondary | ICD-10-CM | POA: Diagnosis not present

## 2015-04-25 DIAGNOSIS — R0602 Shortness of breath: Secondary | ICD-10-CM | POA: Diagnosis not present

## 2015-04-25 DIAGNOSIS — I2510 Atherosclerotic heart disease of native coronary artery without angina pectoris: Secondary | ICD-10-CM | POA: Diagnosis not present

## 2015-05-24 DIAGNOSIS — R5382 Chronic fatigue, unspecified: Secondary | ICD-10-CM | POA: Diagnosis not present

## 2015-05-24 DIAGNOSIS — R5381 Other malaise: Secondary | ICD-10-CM | POA: Diagnosis not present

## 2015-05-24 DIAGNOSIS — I2510 Atherosclerotic heart disease of native coronary artery without angina pectoris: Secondary | ICD-10-CM | POA: Diagnosis not present

## 2015-05-24 DIAGNOSIS — I482 Chronic atrial fibrillation: Secondary | ICD-10-CM | POA: Diagnosis not present

## 2015-06-13 DIAGNOSIS — M5136 Other intervertebral disc degeneration, lumbar region: Secondary | ICD-10-CM | POA: Diagnosis not present

## 2015-06-13 DIAGNOSIS — M47816 Spondylosis without myelopathy or radiculopathy, lumbar region: Secondary | ICD-10-CM | POA: Diagnosis not present

## 2015-06-13 DIAGNOSIS — M160 Bilateral primary osteoarthritis of hip: Secondary | ICD-10-CM | POA: Diagnosis not present

## 2015-06-13 DIAGNOSIS — M1990 Unspecified osteoarthritis, unspecified site: Secondary | ICD-10-CM | POA: Diagnosis not present

## 2015-06-20 DIAGNOSIS — M549 Dorsalgia, unspecified: Secondary | ICD-10-CM | POA: Diagnosis not present

## 2015-07-03 DIAGNOSIS — I482 Chronic atrial fibrillation: Secondary | ICD-10-CM | POA: Diagnosis not present

## 2015-07-03 DIAGNOSIS — R532 Functional quadriplegia: Secondary | ICD-10-CM | POA: Diagnosis not present

## 2015-07-11 DIAGNOSIS — M5442 Lumbago with sciatica, left side: Secondary | ICD-10-CM | POA: Diagnosis not present

## 2015-07-12 DIAGNOSIS — G8929 Other chronic pain: Secondary | ICD-10-CM | POA: Diagnosis not present

## 2015-07-12 DIAGNOSIS — M5442 Lumbago with sciatica, left side: Secondary | ICD-10-CM | POA: Diagnosis not present

## 2015-08-08 DIAGNOSIS — M5136 Other intervertebral disc degeneration, lumbar region: Secondary | ICD-10-CM | POA: Diagnosis not present

## 2015-08-08 DIAGNOSIS — M5442 Lumbago with sciatica, left side: Secondary | ICD-10-CM | POA: Diagnosis not present

## 2015-08-16 DIAGNOSIS — M5442 Lumbago with sciatica, left side: Secondary | ICD-10-CM | POA: Diagnosis not present

## 2015-08-16 DIAGNOSIS — G8929 Other chronic pain: Secondary | ICD-10-CM | POA: Diagnosis not present

## 2015-08-23 DIAGNOSIS — M5136 Other intervertebral disc degeneration, lumbar region: Secondary | ICD-10-CM | POA: Diagnosis not present

## 2015-08-23 DIAGNOSIS — M4807 Spinal stenosis, lumbosacral region: Secondary | ICD-10-CM | POA: Diagnosis not present

## 2015-08-23 DIAGNOSIS — M5416 Radiculopathy, lumbar region: Secondary | ICD-10-CM | POA: Diagnosis not present

## 2015-08-23 DIAGNOSIS — M5442 Lumbago with sciatica, left side: Secondary | ICD-10-CM | POA: Diagnosis not present

## 2015-09-03 DIAGNOSIS — R532 Functional quadriplegia: Secondary | ICD-10-CM | POA: Diagnosis not present

## 2015-09-03 DIAGNOSIS — I482 Chronic atrial fibrillation: Secondary | ICD-10-CM | POA: Diagnosis not present

## 2015-09-19 DIAGNOSIS — I4891 Unspecified atrial fibrillation: Secondary | ICD-10-CM | POA: Diagnosis not present

## 2015-09-19 DIAGNOSIS — E039 Hypothyroidism, unspecified: Secondary | ICD-10-CM | POA: Diagnosis not present

## 2015-09-19 DIAGNOSIS — E789 Disorder of lipoprotein metabolism, unspecified: Secondary | ICD-10-CM | POA: Diagnosis not present

## 2015-10-03 DIAGNOSIS — I482 Chronic atrial fibrillation: Secondary | ICD-10-CM | POA: Diagnosis not present

## 2015-10-03 DIAGNOSIS — R532 Functional quadriplegia: Secondary | ICD-10-CM | POA: Diagnosis not present

## 2015-10-18 DIAGNOSIS — M5136 Other intervertebral disc degeneration, lumbar region: Secondary | ICD-10-CM | POA: Diagnosis not present

## 2015-10-18 DIAGNOSIS — M4807 Spinal stenosis, lumbosacral region: Secondary | ICD-10-CM | POA: Diagnosis not present

## 2015-10-18 DIAGNOSIS — M5416 Radiculopathy, lumbar region: Secondary | ICD-10-CM | POA: Diagnosis not present

## 2015-10-18 DIAGNOSIS — M542 Cervicalgia: Secondary | ICD-10-CM | POA: Diagnosis not present

## 2015-10-26 DIAGNOSIS — M545 Low back pain: Secondary | ICD-10-CM | POA: Diagnosis not present

## 2015-10-26 DIAGNOSIS — M542 Cervicalgia: Secondary | ICD-10-CM | POA: Diagnosis not present

## 2015-11-03 DIAGNOSIS — R532 Functional quadriplegia: Secondary | ICD-10-CM | POA: Diagnosis not present

## 2015-11-03 DIAGNOSIS — I482 Chronic atrial fibrillation: Secondary | ICD-10-CM | POA: Diagnosis not present

## 2015-11-29 DIAGNOSIS — I482 Chronic atrial fibrillation: Secondary | ICD-10-CM | POA: Diagnosis not present

## 2015-11-29 DIAGNOSIS — I10 Essential (primary) hypertension: Secondary | ICD-10-CM | POA: Diagnosis not present

## 2015-11-29 DIAGNOSIS — R0602 Shortness of breath: Secondary | ICD-10-CM | POA: Diagnosis not present

## 2015-11-29 DIAGNOSIS — I2510 Atherosclerotic heart disease of native coronary artery without angina pectoris: Secondary | ICD-10-CM | POA: Diagnosis not present

## 2015-12-03 DIAGNOSIS — R532 Functional quadriplegia: Secondary | ICD-10-CM | POA: Diagnosis not present

## 2015-12-03 DIAGNOSIS — I482 Chronic atrial fibrillation: Secondary | ICD-10-CM | POA: Diagnosis not present

## 2015-12-04 ENCOUNTER — Other Ambulatory Visit: Payer: Self-pay

## 2015-12-04 DIAGNOSIS — Z1231 Encounter for screening mammogram for malignant neoplasm of breast: Secondary | ICD-10-CM

## 2015-12-12 DIAGNOSIS — Z0000 Encounter for general adult medical examination without abnormal findings: Secondary | ICD-10-CM | POA: Diagnosis not present

## 2015-12-12 DIAGNOSIS — E789 Disorder of lipoprotein metabolism, unspecified: Secondary | ICD-10-CM | POA: Diagnosis not present

## 2015-12-12 DIAGNOSIS — I10 Essential (primary) hypertension: Secondary | ICD-10-CM | POA: Diagnosis not present

## 2015-12-12 DIAGNOSIS — E0789 Other specified disorders of thyroid: Secondary | ICD-10-CM | POA: Diagnosis not present

## 2015-12-12 DIAGNOSIS — E118 Type 2 diabetes mellitus with unspecified complications: Secondary | ICD-10-CM | POA: Diagnosis not present

## 2015-12-19 DIAGNOSIS — R0689 Other abnormalities of breathing: Secondary | ICD-10-CM | POA: Diagnosis not present

## 2015-12-19 DIAGNOSIS — R0602 Shortness of breath: Secondary | ICD-10-CM | POA: Diagnosis not present

## 2015-12-19 DIAGNOSIS — N390 Urinary tract infection, site not specified: Secondary | ICD-10-CM | POA: Diagnosis not present

## 2015-12-19 DIAGNOSIS — E118 Type 2 diabetes mellitus with unspecified complications: Secondary | ICD-10-CM | POA: Diagnosis not present

## 2015-12-19 DIAGNOSIS — E032 Hypothyroidism due to medicaments and other exogenous substances: Secondary | ICD-10-CM | POA: Diagnosis not present

## 2015-12-19 DIAGNOSIS — I4891 Unspecified atrial fibrillation: Secondary | ICD-10-CM | POA: Diagnosis not present

## 2015-12-19 DIAGNOSIS — Z7689 Persons encountering health services in other specified circumstances: Secondary | ICD-10-CM | POA: Diagnosis not present

## 2015-12-19 DIAGNOSIS — E789 Disorder of lipoprotein metabolism, unspecified: Secondary | ICD-10-CM | POA: Diagnosis not present

## 2015-12-27 DIAGNOSIS — I482 Chronic atrial fibrillation: Secondary | ICD-10-CM | POA: Diagnosis not present

## 2015-12-27 DIAGNOSIS — R0602 Shortness of breath: Secondary | ICD-10-CM | POA: Diagnosis not present

## 2016-01-03 DIAGNOSIS — I482 Chronic atrial fibrillation: Secondary | ICD-10-CM | POA: Diagnosis not present

## 2016-01-03 DIAGNOSIS — R532 Functional quadriplegia: Secondary | ICD-10-CM | POA: Diagnosis not present

## 2016-01-07 ENCOUNTER — Ambulatory Visit: Payer: 59

## 2016-01-10 ENCOUNTER — Ambulatory Visit
Admission: RE | Admit: 2016-01-10 | Discharge: 2016-01-10 | Disposition: A | Discharge status: Home / Self Care | Payer: Medicare Other | Source: Ambulatory Visit

## 2016-01-10 DIAGNOSIS — Z1231 Encounter for screening mammogram for malignant neoplasm of breast: Secondary | ICD-10-CM

## 2016-01-17 DIAGNOSIS — I482 Chronic atrial fibrillation: Secondary | ICD-10-CM | POA: Diagnosis not present

## 2016-01-17 DIAGNOSIS — R0602 Shortness of breath: Secondary | ICD-10-CM | POA: Diagnosis not present

## 2016-01-31 DIAGNOSIS — I10 Essential (primary) hypertension: Secondary | ICD-10-CM | POA: Diagnosis not present

## 2016-01-31 DIAGNOSIS — E118 Type 2 diabetes mellitus with unspecified complications: Secondary | ICD-10-CM | POA: Diagnosis not present

## 2016-01-31 DIAGNOSIS — E789 Disorder of lipoprotein metabolism, unspecified: Secondary | ICD-10-CM | POA: Diagnosis not present

## 2016-02-03 DIAGNOSIS — I482 Chronic atrial fibrillation: Secondary | ICD-10-CM | POA: Diagnosis not present

## 2016-02-03 DIAGNOSIS — R532 Functional quadriplegia: Secondary | ICD-10-CM | POA: Diagnosis not present

## 2016-02-06 DIAGNOSIS — E118 Type 2 diabetes mellitus with unspecified complications: Secondary | ICD-10-CM | POA: Diagnosis not present

## 2016-02-06 DIAGNOSIS — I482 Chronic atrial fibrillation: Secondary | ICD-10-CM | POA: Diagnosis not present

## 2016-02-06 DIAGNOSIS — G250 Essential tremor: Secondary | ICD-10-CM | POA: Diagnosis not present

## 2016-02-15 DIAGNOSIS — R0602 Shortness of breath: Secondary | ICD-10-CM | POA: Diagnosis not present

## 2016-02-15 DIAGNOSIS — I482 Chronic atrial fibrillation: Secondary | ICD-10-CM | POA: Diagnosis not present

## 2016-02-25 DIAGNOSIS — E789 Disorder of lipoprotein metabolism, unspecified: Secondary | ICD-10-CM | POA: Diagnosis not present

## 2016-02-25 DIAGNOSIS — N390 Urinary tract infection, site not specified: Secondary | ICD-10-CM | POA: Diagnosis not present

## 2016-02-25 DIAGNOSIS — E118 Type 2 diabetes mellitus with unspecified complications: Secondary | ICD-10-CM | POA: Diagnosis not present

## 2016-02-25 DIAGNOSIS — I10 Essential (primary) hypertension: Secondary | ICD-10-CM | POA: Diagnosis not present

## 2016-02-25 DIAGNOSIS — K625 Hemorrhage of anus and rectum: Secondary | ICD-10-CM | POA: Diagnosis not present

## 2016-02-28 ENCOUNTER — Ambulatory Visit
Admission: RE | Admit: 2016-02-28 | Discharge: 2016-02-28 | Disposition: A | Discharge status: Home / Self Care | Payer: Medicare Other | Source: Ambulatory Visit | Attending: Gastroenterology | Admitting: Gastroenterology

## 2016-02-28 ENCOUNTER — Other Ambulatory Visit: Payer: Self-pay | Admitting: Gastroenterology

## 2016-02-28 DIAGNOSIS — K625 Hemorrhage of anus and rectum: Secondary | ICD-10-CM

## 2016-02-28 DIAGNOSIS — R1084 Generalized abdominal pain: Secondary | ICD-10-CM

## 2016-02-28 DIAGNOSIS — K5732 Diverticulitis of large intestine without perforation or abscess without bleeding: Secondary | ICD-10-CM | POA: Diagnosis not present

## 2016-02-28 MED ORDER — IOPAMIDOL (ISOVUE-300) INJECTION 61%: 100.0000 mL | Freq: Once | INTRAVENOUS | Status: DC | PRN

## 2016-03-02 DIAGNOSIS — R532 Functional quadriplegia: Secondary | ICD-10-CM | POA: Diagnosis not present

## 2016-03-02 DIAGNOSIS — I482 Chronic atrial fibrillation: Secondary | ICD-10-CM | POA: Diagnosis not present

## 2016-03-15 ENCOUNTER — Emergency Department (HOSPITAL_COMMUNITY): Payer: Medicare Other

## 2016-03-15 ENCOUNTER — Encounter (HOSPITAL_COMMUNITY): Payer: Self-pay | Admitting: Physical Medicine and Rehabilitation

## 2016-03-15 ENCOUNTER — Emergency Department (HOSPITAL_COMMUNITY)
Admission: EM | Admit: 2016-03-15 | Discharge: 2016-03-15 | Disposition: A | Discharge status: Home / Self Care | Payer: Medicare Other | Attending: Emergency Medicine | Admitting: Emergency Medicine

## 2016-03-15 DIAGNOSIS — Z79899 Other long term (current) drug therapy: Secondary | ICD-10-CM | POA: Insufficient documentation

## 2016-03-15 DIAGNOSIS — M25532 Pain in left wrist: Secondary | ICD-10-CM | POA: Diagnosis not present

## 2016-03-15 DIAGNOSIS — Y9389 Activity, other specified: Secondary | ICD-10-CM | POA: Diagnosis not present

## 2016-03-15 DIAGNOSIS — S63502A Unspecified sprain of left wrist, initial encounter: Secondary | ICD-10-CM | POA: Diagnosis not present

## 2016-03-15 DIAGNOSIS — I2510 Atherosclerotic heart disease of native coronary artery without angina pectoris: Secondary | ICD-10-CM | POA: Insufficient documentation

## 2016-03-15 DIAGNOSIS — I10 Essential (primary) hypertension: Secondary | ICD-10-CM | POA: Insufficient documentation

## 2016-03-15 DIAGNOSIS — S6992XA Unspecified injury of left wrist, hand and finger(s), initial encounter: Secondary | ICD-10-CM | POA: Diagnosis not present

## 2016-03-15 DIAGNOSIS — E119 Type 2 diabetes mellitus without complications: Secondary | ICD-10-CM | POA: Insufficient documentation

## 2016-03-15 DIAGNOSIS — Z85118 Personal history of other malignant neoplasm of bronchus and lung: Secondary | ICD-10-CM | POA: Insufficient documentation

## 2016-03-15 DIAGNOSIS — X58XXXA Exposure to other specified factors, initial encounter: Secondary | ICD-10-CM | POA: Insufficient documentation

## 2016-03-15 DIAGNOSIS — M79642 Pain in left hand: Secondary | ICD-10-CM | POA: Diagnosis not present

## 2016-03-15 DIAGNOSIS — S6392XA Sprain of unspecified part of left wrist and hand, initial encounter: Secondary | ICD-10-CM | POA: Diagnosis not present

## 2016-03-15 DIAGNOSIS — Y998 Other external cause status: Secondary | ICD-10-CM | POA: Insufficient documentation

## 2016-03-15 DIAGNOSIS — Y9289 Other specified places as the place of occurrence of the external cause: Secondary | ICD-10-CM | POA: Diagnosis not present

## 2016-03-15 DIAGNOSIS — Z8673 Personal history of transient ischemic attack (TIA), and cerebral infarction without residual deficits: Secondary | ICD-10-CM | POA: Diagnosis not present

## 2016-03-15 MED ORDER — HYDROCODONE-ACETAMINOPHEN 5-325 MG PO TABS
1.0000 | ORAL_TABLET | Freq: Once | ORAL | Status: AC
  Administered 2016-03-15: 1 via ORAL
  Filled 2016-03-15: qty 1

## 2016-03-15 MED ORDER — FENTANYL CITRATE (PF) 100 MCG/2ML IJ SOLN
50.0000 ug | Freq: Once | INTRAMUSCULAR | Status: AC
  Administered 2016-03-15: 50 ug via INTRAMUSCULAR
  Filled 2016-03-15: qty 2

## 2016-03-15 MED ORDER — TRAMADOL HCL 50 MG PO TABS: 50.0000 mg | ORAL_TABLET | Freq: Four times a day (QID) | ORAL | Status: DC | PRN

## 2016-03-15 MED ORDER — ONDANSETRON 4 MG PO TBDP
4.0000 mg | ORAL_TABLET | Freq: Once | ORAL | Status: AC
  Administered 2016-03-15: 4 mg via ORAL
  Filled 2016-03-15: qty 1

## 2016-03-15 NOTE — ED Notes (Signed)
Pt states she was cooking last Sunday and heard something "pop" to L wrist. Now reports pain to hand, wrist and forearm. Pain increases with movement

## 2016-03-15 NOTE — Progress Notes (Signed)
Orthopedic Tech Progress Note Patient Details:  [name] 12/20/1934 [id number]  Ortho Devices Type of Ortho Device: Arm sling, Velcro wrist splint Ortho Device/Splint Location: lue Ortho Device/Splint Interventions: Application   Aydyn Testerman 03/15/2016, 10:21 AM

## 2016-03-15 NOTE — Discharge Instructions (Signed)
Your x-rays showed no evidence of fracture. Please call [facility] to schedule a follow up appointment for next week. In the meantime I will give you a prescription for Tramadol which you may take as needed for pain.    Wrist Sprain A wrist sprain is a stretch or tear in the strong, fibrous tissues (ligaments) that connect your wrist bones. The ligaments of your wrist may be easily sprained. There are three types of wrist sprains.  Grade 1. The ligament is not stretched or torn, but the sprain causes pain.  Grade 2. The ligament is stretched or partially torn. You may be able to move your wrist, but not very much.  Grade 3. The ligament or muscle completely tears. You may find it difficult or extremely painful to move your wrist even a little. CAUSES Often, wrist sprains are a result of a fall or an injury. The force of the impact causes the fibers of your ligament to stretch too much or tear. Common causes of wrist sprains include:  Overextending your wrist while catching a ball with your hands.  Repetitive or strenuous extension or bending of your wrist.  Landing on your hand during a fall. RISK FACTORS  Having previous wrist injuries.  Playing contact sports, such as boxing or wrestling.  Participating in activities in which falling is common.  Having poor wrist strength and flexibility. SIGNS AND SYMPTOMS  Wrist pain.  Wrist tenderness.  Inflammation or bruising of the wrist area.  Hearing a "pop" or feeling a tear at the time of the injury.  Decreased wrist movement due to pain, stiffness, or weakness. DIAGNOSIS Your health care provider will examine your wrist. In some cases, an X-ray will be taken to make sure you did not break any bones. If your health care provider thinks that you tore a ligament, he or she may order an MRI of your wrist. TREATMENT Treatment involves resting and icing your wrist. You may also need to take pain medicines to help lessen pain  and inflammation. Your health care provider may recommend keeping your wrist still (immobilized) with a splint to help your sprain heal. When the splint is no longer necessary, you may need to perform strengthening and stretching exercises. These exercises help you to regain strength and full range of motion in your wrist. Surgery is not usually needed for wrist sprains unless the ligament completely tears. HOME CARE INSTRUCTIONS  Rest your wrist. Do not do things that cause pain.  Wear your wrist splint as directed by your health care provider.  Take medicines only as directed by your health care provider.  To ease pain and swelling, apply ice to the injured area.  Put ice in a plastic bag.  Place a towel between your skin and the bag.  Leave the ice on for 20 minutes, 2-3 times a day. SEEK MEDICAL CARE IF:  Your pain, discomfort, or swelling gets worse even with treatment.  You feel sudden numbness in your hand.   This information is not intended to replace advice given to you by your health care provider. Make sure you discuss any questions you have with your health care provider.   Document Released: 08/18/2014 Document Reviewed: 08/18/2014 Elsevier Interactive Patient Education [employer].

## 2016-03-15 NOTE — ED Provider Notes (Signed)
CSN: [id number]     Arrival date & time 03/15/16  1607 History   First MD Initiated Contact with Patient 03/15/16 [phone]     Chief Complaint  Patient presents with  . Wrist Pain    HPI   [name] is an 82 y.o. female with history of CAD, stroke, DM, lung cancer, HTN, afib who presents to the ED for evaluation of left wrist injury. She states she was cooking 6 days ago when she lifted up a heavy iron skillet with her left hand and heard a pop in her left wrist. She states that since then she has had pain in her left wrist that seems to radiate down her hand and up her arm. She states the pain has progressively gotten worse and has gotten to the point now where she cannot move her left hand at all due to the pain. She has been taking tylenol at home with no relief. Denies new numbness or tingling. She is on Xarelto.   Past Medical History  Diagnosis Date  . Lung cancer ([place])   . Stroke ([place])   . Diabetes mellitus   . CAD (coronary artery disease) 03/13/2012  . Type II or unspecified type diabetes mellitus without mention of complication, not stated as uncontrolled 03/13/2012  . Hypothyroid 03/13/2012  . HTN (hypertension) 03/13/2012  . Dysrhythmia     ATRIAL FIB  . Shortness of breath    Past Surgical History  Procedure Laterality Date  . Cardioversion  04/20/2012    Procedure: CARDIOVERSION;  Surgeon: Laverda Page, MD;  Location: [place];  Service: Cardiovascular;  Laterality: N/A;  . Abdominal hysterectomy    . Tumors      back of head  . Hemorrhoid surgery    . Jaw replacement    . Cholecystectomy    . Breast surgery      benign tumors removed in 1980  . Cardiac catheterization    . Cardioversion N/A 05/10/2013    Procedure: CARDIOVERSION;  Surgeon: Laverda Page, MD;  Location: [place];  Service: Cardiovascular;  Laterality: N/A;  . Left heart catheterization with coronary angiogram N/A 04/20/2015    Procedure: LEFT HEART CATHETERIZATION WITH CORONARY ANGIOGRAM;   Surgeon: Adrian Prows, MD;  Location: [facility] CATH LAB;  Service: Cardiovascular;  Laterality: N/A;   Family History  Problem Relation Age of Onset  . Breast cancer Mother   . Aneurysm Father     Likely thoracic aneurysm per patient  . Melanoma Brother   . Heart disease Sister   . Diabetes type II Sister    Social History  Substance Use Topics  . Smoking status: Never Smoker   . Smokeless tobacco: Never Used  . Alcohol Use: No   OB History    No data available     Review of Systems  All other systems reviewed and are negative.     Allergies  Codeine and Hydrocodone-acetaminophen  Home Medications   Prior to Admission medications   Medication Sig Start Date End Date Taking? Authorizing Provider  acetaminophen (TYLENOL) 500 MG tablet Take 1,000 mg by mouth every 6 (six) hours as needed for pain.     Historical Provider, MD  atorvastatin (LIPITOR) 40 MG tablet Take 1 tablet (40 mg total) by mouth daily. 04/20/15   Janece Canterbury, MD  Calcium Carb-Cholecalciferol (CALCIUM-VITAMIN D3) 600-500 MG-UNIT CAPS Take 1 capsule by mouth daily.    Historical Provider, MD  colesevelam (WELCHOL) 625 MG tablet Take 625 mg by  mouth daily.     Historical Provider, MD  diltiazem (CARDIZEM CD) 180 MG 24 hr capsule Take 1 capsule (180 mg total) by mouth daily. 04/20/15   Janece Canterbury, MD  isosorbide mononitrate (IMDUR) 60 MG 24 hr tablet Take 1 tablet (60 mg total) by mouth daily. 04/20/15   Janece Canterbury, MD  levothyroxine (SYNTHROID, LEVOTHROID) 150 MCG tablet Take 150 mcg by mouth daily.    Historical Provider, MD  Mag Oxide-Vit D3-Turmeric (MAGNESIUM-VITAMIN D3-TURMERIC PO) Take 1 tablet by mouth daily.    Historical Provider, MD  meclizine (ANTIVERT) 25 MG tablet Take 25 mg by mouth daily as needed for dizziness.     Historical Provider, MD  Multiple Vitamins-Minerals (WOMENS BONE HEALTH PO) Take 1 tablet by mouth daily.    Historical Provider, MD  Polyethyl Glycol-Propyl Glycol (SYSTANE)  0.4-0.3 % SOLN Apply 2 drops to eye 2 (two) times daily as needed (dry eyes).    Historical Provider, MD  polyvinyl alcohol (LIQUIFILM TEARS) 1.4 % ophthalmic solution Place 2 drops into both eyes as needed for dry eyes.     Historical Provider, MD  Rivaroxaban (XARELTO) 15 MG TABS tablet Take 15 mg by mouth daily with supper. 03/16/12   Adrian Prows, MD  sotalol (BETAPACE) 80 MG tablet Take 80 mg by mouth 2 (two) times daily.    Historical Provider, MD   BP 127/98 mmHg  Pulse 123  Temp(Src) 97.3 F (36.3 C) (Oral)  Resp 16  SpO2 97% Physical Exam  Constitutional: She is oriented to person, place, and time. No distress.  HENT:  Head: Atraumatic.  Right Ear: External ear normal.  Left Ear: External ear normal.  Nose: Nose normal.  Eyes: Conjunctivae are normal. No scleral icterus.  Neck: Normal range of motion. Neck supple.  Cardiovascular: Normal rate and regular rhythm.   Pulmonary/Chest: Effort normal. No respiratory distress. She exhibits no tenderness.  Abdominal: Soft. She exhibits no distension. There is no tenderness.  Musculoskeletal:  Left wrist is slightly edematous, red. Diffusely markedly tender to palpation with guarding. Hand is not tender. Cap refill < 3 sec, 2+ radial pulse. Pt can wiggle fingers, cannot grip. Limited wrist ROM 2/2 pain.   Neurological: She is alert and oriented to person, place, and time.  Skin: Skin is warm and dry. She is not diaphoretic.  Psychiatric: She has a normal mood and affect. Her behavior is normal.  Nursing note and vitals reviewed.  Filed Vitals:   03/15/16 0713 03/15/16 0744 03/15/16 0940  BP: 127/98 134/76 127/84  Pulse: 123 111 121  Temp: 97.3 F (36.3 C)  98 F (36.7 C)  TempSrc: Oral  Oral  Resp: '16 18 18  '$ SpO2: 97% 97%      ED Course  Procedures (including critical care time) Labs Review Labs Reviewed - No data to display  Imaging Review Dg Wrist Complete Left  03/15/2016  CLINICAL DATA:  New lifting injury of the  wrist with a popping sensation 6 days ago. Continued lateral wrist pain EXAM: LEFT WRIST - COMPLETE 3+ VIEW COMPARISON:  02/14/2011 FINDINGS: Deformity from prior fifth metacarpal shaft fracture. New linear calcification in the expected location of the TFCC disc favoring chondrocalcinosis. Mild spurring of the distal pole of the scaphoid. Chondral thinning in the carpus leads to mild carpal crowding. There is some ridging of the midportion and distal pole of the scaphoid. Upper normal with of the scapholunate distance. IMPRESSION: 1. I do not see an acute fracture. 2. Spurring in the  scaphoid. 3. There is new chondrocalcinosis of the TFCC, raising the possibility of CPPD arthropathy. 4. If pain persists despite conservative therapy, MRI may be warranted for further characterization. Electronically Signed   By: Van Clines M.D.   On: 03/15/2016 08:19   Dg Hand Complete Left  03/15/2016  CLINICAL DATA:  "Pop" in left wrist.  Pain. EXAM: LEFT HAND - COMPLETE 3+ VIEW COMPARISON:  None. FINDINGS: Degenerative changes in the fingers. No wrist abnormalities identified. IMPRESSION: Degenerative changes in the hand. Electronically Signed   By: Dorise Bullion III M.D   On: 03/15/2016 09:30   I have personally reviewed and evaluated these images and lab results as part of my medical decision-making.   EKG Interpretation None      MDM   Final diagnoses:  Left wrist sprain, initial encounter  Left wrist injury, initial encounter    X-rays negative. Pt is neurovascularly intact. Suspect sprain/starin. Pt placed in wrist splint and given ortho f/u. Rx for tramadol given to help with pain. Pt tachycardic though has known afib. Denies any cardiac symptoms at this time. Pt stable for discharge with outpatient f/u. ER return precautions given.    Anne Ng, PA-C 03/15/16 1043  Nat Christen, MD 03/15/16 [phone]

## 2016-03-18 DIAGNOSIS — M19032 Primary osteoarthritis, left wrist: Secondary | ICD-10-CM | POA: Diagnosis not present

## 2016-03-18 DIAGNOSIS — M25532 Pain in left wrist: Secondary | ICD-10-CM | POA: Diagnosis not present

## 2016-03-28 ENCOUNTER — Encounter (HOSPITAL_COMMUNITY): Payer: Self-pay | Admitting: Emergency Medicine

## 2016-03-28 ENCOUNTER — Emergency Department (HOSPITAL_COMMUNITY)
Admission: EM | Admit: 2016-03-28 | Discharge: 2016-03-28 | Disposition: A | Discharge status: Home / Self Care | Payer: Medicare Other | Attending: Emergency Medicine | Admitting: Emergency Medicine

## 2016-03-28 DIAGNOSIS — I2510 Atherosclerotic heart disease of native coronary artery without angina pectoris: Secondary | ICD-10-CM | POA: Insufficient documentation

## 2016-03-28 DIAGNOSIS — Z7901 Long term (current) use of anticoagulants: Secondary | ICD-10-CM | POA: Insufficient documentation

## 2016-03-28 DIAGNOSIS — I4891 Unspecified atrial fibrillation: Secondary | ICD-10-CM | POA: Insufficient documentation

## 2016-03-28 DIAGNOSIS — R21 Rash and other nonspecific skin eruption: Secondary | ICD-10-CM

## 2016-03-28 DIAGNOSIS — L299 Pruritus, unspecified: Secondary | ICD-10-CM | POA: Diagnosis not present

## 2016-03-28 DIAGNOSIS — Z85118 Personal history of other malignant neoplasm of bronchus and lung: Secondary | ICD-10-CM | POA: Diagnosis not present

## 2016-03-28 DIAGNOSIS — Z79899 Other long term (current) drug therapy: Secondary | ICD-10-CM | POA: Diagnosis not present

## 2016-03-28 DIAGNOSIS — I10 Essential (primary) hypertension: Secondary | ICD-10-CM | POA: Diagnosis not present

## 2016-03-28 DIAGNOSIS — Z8673 Personal history of transient ischemic attack (TIA), and cerebral infarction without residual deficits: Secondary | ICD-10-CM | POA: Insufficient documentation

## 2016-03-28 DIAGNOSIS — E039 Hypothyroidism, unspecified: Secondary | ICD-10-CM | POA: Insufficient documentation

## 2016-03-28 DIAGNOSIS — E119 Type 2 diabetes mellitus without complications: Secondary | ICD-10-CM | POA: Diagnosis not present

## 2016-03-28 DIAGNOSIS — Z9889 Other specified postprocedural states: Secondary | ICD-10-CM | POA: Diagnosis not present

## 2016-03-28 MED ORDER — TRIAMCINOLONE ACETONIDE 0.025 % EX OINT: 1.0000 "application " | TOPICAL_OINTMENT | Freq: Two times a day (BID) | CUTANEOUS | Status: DC

## 2016-03-28 NOTE — ED Provider Notes (Signed)
CSN: [id number]     Arrival date & time 03/28/16  1156 History  By signing my name below, I, Essence Howell, attest that this documentation has been prepared under the direction and in the presence of Montine Circle, PA-C Electronically Signed: Ladene Artist, ED Scribe 03/28/2016 at 12:16 PM.   No chief complaint on file.  The history is provided by the patient. No language interpreter was used.   HPI Comments: [name] is a 82 y.o. female, with a h/o DM, HTN, who presents to the Emergency Department with a chief complaint of a gradually spreading painful, pruritic rash to her back, neck and head first noticed 3 weeks ago. She has tried Benadryl, baby powder and topical oil without significant relief. No h/o similar rash.   Past Medical History  Diagnosis Date  . Lung cancer ([place])   . Stroke ([place])   . Diabetes mellitus   . CAD (coronary artery disease) 03/13/2012  . Type II or unspecified type diabetes mellitus without mention of complication, not stated as uncontrolled 03/13/2012  . Hypothyroid 03/13/2012  . HTN (hypertension) 03/13/2012  . Dysrhythmia     ATRIAL FIB  . Shortness of breath    Past Surgical History  Procedure Laterality Date  . Cardioversion  04/20/2012    Procedure: CARDIOVERSION;  Surgeon: Laverda Page, MD;  Location: [place];  Service: Cardiovascular;  Laterality: N/A;  . Abdominal hysterectomy    . Tumors      back of head  . Hemorrhoid surgery    . Jaw replacement    . Cholecystectomy    . Breast surgery      benign tumors removed in 1980  . Cardiac catheterization    . Cardioversion N/A 05/10/2013    Procedure: CARDIOVERSION;  Surgeon: Laverda Page, MD;  Location: [place];  Service: Cardiovascular;  Laterality: N/A;  . Left heart catheterization with coronary angiogram N/A 04/20/2015    Procedure: LEFT HEART CATHETERIZATION WITH CORONARY ANGIOGRAM;  Surgeon: Adrian Prows, MD;  Location: [facility] CATH LAB;  Service: Cardiovascular;  Laterality: N/A;    Family History  Problem Relation Age of Onset  . Breast cancer Mother   . Aneurysm Father     Likely thoracic aneurysm per patient  . Melanoma Brother   . Heart disease Sister   . Diabetes type II Sister    Social History  Substance Use Topics  . Smoking status: Never Smoker   . Smokeless tobacco: Never Used  . Alcohol Use: No   OB History    No data available     Review of Systems  Constitutional: Negative for fever.  Skin: Positive for rash.   Allergies  Codeine and Hydrocodone-acetaminophen  Home Medications   Prior to Admission medications   Medication Sig Start Date End Date Taking? Authorizing Provider  acetaminophen (TYLENOL) 500 MG tablet Take 1,000 mg by mouth every 6 (six) hours as needed for pain.     Historical Provider, MD  atorvastatin (LIPITOR) 40 MG tablet Take 1 tablet (40 mg total) by mouth daily. 04/20/15   Janece Canterbury, MD  Calcium Carb-Cholecalciferol (CALCIUM-VITAMIN D3) 600-500 MG-UNIT CAPS Take 1 capsule by mouth daily.    Historical Provider, MD  colesevelam (WELCHOL) 625 MG tablet Take 625 mg by mouth daily.     Historical Provider, MD  diltiazem (CARDIZEM CD) 180 MG 24 hr capsule Take 1 capsule (180 mg total) by mouth daily. 04/20/15   Janece Canterbury, MD  isosorbide mononitrate (IMDUR) 60 MG  24 hr tablet Take 1 tablet (60 mg total) by mouth daily. 04/20/15   Janece Canterbury, MD  levothyroxine (SYNTHROID, LEVOTHROID) 150 MCG tablet Take 150 mcg by mouth daily.    Historical Provider, MD  Mag Oxide-Vit D3-Turmeric (MAGNESIUM-VITAMIN D3-TURMERIC PO) Take 1 tablet by mouth daily.    Historical Provider, MD  meclizine (ANTIVERT) 25 MG tablet Take 25 mg by mouth daily as needed for dizziness.     Historical Provider, MD  Multiple Vitamins-Minerals (WOMENS BONE HEALTH PO) Take 1 tablet by mouth daily.    Historical Provider, MD  Polyethyl Glycol-Propyl Glycol (SYSTANE) 0.4-0.3 % SOLN Apply 2 drops to eye 2 (two) times daily as needed (dry eyes).     Historical Provider, MD  polyvinyl alcohol (LIQUIFILM TEARS) 1.4 % ophthalmic solution Place 2 drops into both eyes as needed for dry eyes.     Historical Provider, MD  Rivaroxaban (XARELTO) 15 MG TABS tablet Take 15 mg by mouth daily with supper. 03/16/12   Adrian Prows, MD  sotalol (BETAPACE) 80 MG tablet Take 80 mg by mouth 2 (two) times daily.    Historical Provider, MD  traMADol (ULTRAM) 50 MG tablet Take 1 tablet (50 mg total) by mouth every 6 (six) hours as needed. 03/15/16   Olivia Canter Sam, PA-C   BP 106/71 mmHg  Pulse 106  Temp(Src) 97.9 F (36.6 C) (Oral)  Resp 14  SpO2 98% Physical Exam  Constitutional: She is oriented to person, place, and time. She appears well-developed and well-nourished. No distress.  HENT:  Head: Normocephalic and atraumatic.  Eyes: Conjunctivae and EOM are normal.  Neck: Neck supple. No tracheal deviation present.  Cardiovascular: Normal rate.   Pulmonary/Chest: Effort normal. No respiratory distress.  Musculoskeletal: Normal range of motion.  Neurological: She is alert and oriented to person, place, and time.  Skin: Skin is warm and dry. Rash noted.  Faint macular rash on bilateral shoulders and upper back and left anterior chest, no evidence of abscess, no evidence of cellulitis  Psychiatric: She has a normal mood and affect. Her behavior is normal.  Nursing note and vitals reviewed.  ED Course  Procedures (including critical care time) DIAGNOSTIC STUDIES: Oxygen Saturation is 98% on RA, normal by my interpretation.    COORDINATION OF CARE: 12:15 PM-Discussed treatment plan which includes Kenalog ointment with pt at bedside and pt agreed to plan.     MDM   Final diagnoses:  Rash    Patient with pruritic rash on bilateral shoulder blades and left anterior chest wall. Unclear etiology. Has been present for 3 weeks. Crosses midline, doubt shingles. No evidence of cellulitis. She has been taking antibiotics for the past week, but the rash was  present before taking these antibiotics. Doubt drug reaction. Consider dermatitis. Will treat with triamcinolone cream. Recommend follow-up on Monday with primary care doctor. Patient understands and agrees with the plan. She is stable and ready for discharge.  I personally performed the services described in this documentation, which was scribed in my presence. The recorded information has been reviewed and is accurate.      Montine Circle, PA-C 03/28/16 1223  Nat Christen, MD 03/28/16 [phone]

## 2016-03-28 NOTE — Discharge Instructions (Signed)
Contact Dermatitis Dermatitis is redness, soreness, and swelling (inflammation) of the skin. Contact dermatitis is a reaction to certain substances that touch the skin. There are two types of contact dermatitis:   Irritant contact dermatitis. This type is caused by something that irritates your skin, such as dry hands from washing them too much. This type does not require previous exposure to the substance for a reaction to occur. This type is more common.  Allergic contact dermatitis. This type is caused by a substance that you are allergic to, such as a nickel allergy or poison ivy. This type only occurs if you have been exposed to the substance (allergen) before. Upon a repeat exposure, your body reacts to the substance. This type is less common. CAUSES  Many different substances can cause contact dermatitis. Irritant contact dermatitis is most commonly caused by exposure to:   Makeup.   Soaps.   Detergents.   Bleaches.   Acids.   Metal salts, such as nickel.  Allergic contact dermatitis is most commonly caused by exposure to:   Poisonous plants.   Chemicals.   Jewelry.   Latex.   Medicines.   Preservatives in products, such as clothing.  RISK FACTORS This condition is more likely to develop in:   People who have jobs that expose them to irritants or allergens.  People who have certain medical conditions, such as asthma or eczema.  SYMPTOMS  Symptoms of this condition may occur anywhere on your body where the irritant has touched you or is touched by you. Symptoms include:  Dryness or flaking.   Redness.   Cracks.   Itching.   Pain or a burning feeling.   Blisters.  Drainage of small amounts of blood or clear fluid from skin cracks. With allergic contact dermatitis, there may also be swelling in areas such as the eyelids, mouth, or genitals.  DIAGNOSIS  This condition is diagnosed with a medical history and physical exam. A patch skin test  may be performed to help determine the cause. If the condition is related to your job, you may need to see an occupational medicine specialist. TREATMENT Treatment for this condition includes figuring out what caused the reaction and protecting your skin from further contact. Treatment may also include:   Steroid creams or ointments. Oral steroid medicines may be needed in more severe cases.  Antibiotics or antibacterial ointments, if a skin infection is present.  Antihistamine lotion or an antihistamine taken by mouth to ease itching.  A bandage (dressing). HOME CARE INSTRUCTIONS Skin Care  Moisturize your skin as needed.   Apply cool compresses to the affected areas.  Try taking a bath with:  Epsom salts. Follow the instructions on the packaging. You can get these at your local pharmacy or grocery store.  Baking soda. Pour a small amount into the bath as directed by your health care provider.  Colloidal oatmeal. Follow the instructions on the packaging. You can get this at your local pharmacy or grocery store.  Try applying baking soda paste to your skin. Stir water into baking soda until it reaches a paste-like consistency.  Do not scratch your skin.  Bathe less frequently, such as every other day.  Bathe in lukewarm water. Avoid using hot water. Medicines  Take or apply over-the-counter and prescription medicines only as told by your health care provider.   If you were prescribed an antibiotic medicine, take or apply your antibiotic as told by your health care provider. Do not stop using the   antibiotic even if your condition starts to improve. General Instructions  Keep all follow-up visits as told by your health care provider. This is important.  Avoid the substance that caused your reaction. If you do not know what caused it, keep a journal to try to track what caused it. Write down:  What you eat.  What cosmetic products you use.  What you drink.  What  you wear in the affected area. This includes jewelry.  If you were given a dressing, take care of it as told by your health care provider. This includes when to change and remove it. SEEK MEDICAL CARE IF:   Your condition does not improve with treatment.  Your condition gets worse.  You have signs of infection such as swelling, tenderness, redness, soreness, or warmth in the affected area.  You have a fever.  You have new symptoms. SEEK IMMEDIATE MEDICAL CARE IF:   You have a severe headache, neck pain, or neck stiffness.  You vomit.  You feel very sleepy.  You notice red streaks coming from the affected area.  Your bone or joint underneath the affected area becomes painful after the skin has healed.  The affected area turns darker.  You have difficulty breathing.   This information is not intended to replace advice given to you by your health care provider. Make sure you discuss any questions you have with your health care provider.   Document Released: 12/12/2000 Document Revised: 09/05/2015 Document Reviewed: 05/02/2015 Elsevier Interactive Patient Education 2016 [employer]  

## 2016-03-28 NOTE — ED Notes (Signed)
Started 3 weeks ago with itching, painful rash to upper back which is now spreading to her chest. Has tried multiple home remedies w/o improvement.

## 2016-03-31 DIAGNOSIS — L309 Dermatitis, unspecified: Secondary | ICD-10-CM | POA: Diagnosis not present

## 2016-04-02 ENCOUNTER — Observation Stay (HOSPITAL_COMMUNITY)
Admission: EM | Admit: 2016-04-02 | Discharge: 2016-04-04 | Disposition: A | Discharge status: Home / Self Care | Payer: Medicare Other | Attending: Internal Medicine | Admitting: Internal Medicine

## 2016-04-02 ENCOUNTER — Emergency Department (HOSPITAL_COMMUNITY): Payer: Medicare Other

## 2016-04-02 ENCOUNTER — Encounter (HOSPITAL_COMMUNITY): Payer: Self-pay | Admitting: *Deleted

## 2016-04-02 DIAGNOSIS — I480 Paroxysmal atrial fibrillation: Secondary | ICD-10-CM

## 2016-04-02 DIAGNOSIS — I10 Essential (primary) hypertension: Secondary | ICD-10-CM | POA: Diagnosis not present

## 2016-04-02 DIAGNOSIS — N289 Disorder of kidney and ureter, unspecified: Secondary | ICD-10-CM | POA: Diagnosis not present

## 2016-04-02 DIAGNOSIS — D696 Thrombocytopenia, unspecified: Secondary | ICD-10-CM | POA: Insufficient documentation

## 2016-04-02 DIAGNOSIS — I2510 Atherosclerotic heart disease of native coronary artery without angina pectoris: Secondary | ICD-10-CM | POA: Diagnosis present

## 2016-04-02 DIAGNOSIS — E785 Hyperlipidemia, unspecified: Secondary | ICD-10-CM | POA: Diagnosis not present

## 2016-04-02 DIAGNOSIS — I773 Arterial fibromuscular dysplasia: Secondary | ICD-10-CM | POA: Insufficient documentation

## 2016-04-02 DIAGNOSIS — E039 Hypothyroidism, unspecified: Secondary | ICD-10-CM | POA: Diagnosis present

## 2016-04-02 DIAGNOSIS — Z794 Long term (current) use of insulin: Secondary | ICD-10-CM | POA: Diagnosis present

## 2016-04-02 DIAGNOSIS — Y92000 Kitchen of unspecified non-institutional (private) residence as  the place of occurrence of the external cause: Secondary | ICD-10-CM | POA: Insufficient documentation

## 2016-04-02 DIAGNOSIS — R0602 Shortness of breath: Secondary | ICD-10-CM | POA: Diagnosis not present

## 2016-04-02 DIAGNOSIS — Y93G3 Activity, cooking and baking: Secondary | ICD-10-CM | POA: Insufficient documentation

## 2016-04-02 DIAGNOSIS — R532 Functional quadriplegia: Secondary | ICD-10-CM | POA: Diagnosis not present

## 2016-04-02 DIAGNOSIS — Z7901 Long term (current) use of anticoagulants: Secondary | ICD-10-CM | POA: Insufficient documentation

## 2016-04-02 DIAGNOSIS — C7A090 Malignant carcinoid tumor of the bronchus and lung: Secondary | ICD-10-CM | POA: Diagnosis not present

## 2016-04-02 DIAGNOSIS — D649 Anemia, unspecified: Secondary | ICD-10-CM | POA: Diagnosis not present

## 2016-04-02 DIAGNOSIS — Z8673 Personal history of transient ischemic attack (TIA), and cerebral infarction without residual deficits: Secondary | ICD-10-CM | POA: Diagnosis not present

## 2016-04-02 DIAGNOSIS — Y998 Other external cause status: Secondary | ICD-10-CM | POA: Diagnosis not present

## 2016-04-02 DIAGNOSIS — K625 Hemorrhage of anus and rectum: Secondary | ICD-10-CM | POA: Diagnosis not present

## 2016-04-02 DIAGNOSIS — I482 Chronic atrial fibrillation: Secondary | ICD-10-CM | POA: Diagnosis not present

## 2016-04-02 DIAGNOSIS — E1165 Type 2 diabetes mellitus with hyperglycemia: Secondary | ICD-10-CM | POA: Diagnosis not present

## 2016-04-02 DIAGNOSIS — W1809XA Striking against other object with subsequent fall, initial encounter: Secondary | ICD-10-CM | POA: Diagnosis not present

## 2016-04-02 DIAGNOSIS — S0993XA Unspecified injury of face, initial encounter: Secondary | ICD-10-CM | POA: Diagnosis not present

## 2016-04-02 DIAGNOSIS — Z79899 Other long term (current) drug therapy: Secondary | ICD-10-CM | POA: Diagnosis not present

## 2016-04-02 DIAGNOSIS — R7989 Other specified abnormal findings of blood chemistry: Secondary | ICD-10-CM

## 2016-04-02 DIAGNOSIS — E1129 Type 2 diabetes mellitus with other diabetic kidney complication: Secondary | ICD-10-CM | POA: Insufficient documentation

## 2016-04-02 DIAGNOSIS — R55 Syncope and collapse: Principal | ICD-10-CM | POA: Diagnosis present

## 2016-04-02 DIAGNOSIS — S00531A Contusion of lip, initial encounter: Secondary | ICD-10-CM | POA: Insufficient documentation

## 2016-04-02 DIAGNOSIS — R609 Edema, unspecified: Secondary | ICD-10-CM

## 2016-04-02 DIAGNOSIS — I639 Cerebral infarction, unspecified: Secondary | ICD-10-CM | POA: Diagnosis present

## 2016-04-02 DIAGNOSIS — IMO0002 Reserved for concepts with insufficient information to code with codable children: Secondary | ICD-10-CM | POA: Diagnosis present

## 2016-04-02 DIAGNOSIS — I4891 Unspecified atrial fibrillation: Secondary | ICD-10-CM | POA: Diagnosis present

## 2016-04-02 DIAGNOSIS — S0990XA Unspecified injury of head, initial encounter: Secondary | ICD-10-CM | POA: Diagnosis not present

## 2016-04-02 DIAGNOSIS — R220 Localized swelling, mass and lump, head: Secondary | ICD-10-CM | POA: Diagnosis not present

## 2016-04-02 DIAGNOSIS — T1490 Injury, unspecified: Secondary | ICD-10-CM | POA: Diagnosis not present

## 2016-04-02 DIAGNOSIS — E119 Type 2 diabetes mellitus without complications: Secondary | ICD-10-CM | POA: Diagnosis present

## 2016-04-02 HISTORY — DX: Syncope and collapse: R55

## 2016-04-02 LAB — CBC WITH DIFFERENTIAL/PLATELET
Basophils Absolute: 0 10*3/uL (ref 0.0–0.1)
Basophils Relative: 0 %
EOS ABS: 0.1 10*3/uL (ref 0.0–0.7)
EOS PCT: 1 %
HCT: 31.1 % — ABNORMAL LOW (ref 36.0–46.0)
Hemoglobin: 10.3 g/dL — ABNORMAL LOW (ref 12.0–15.0)
LYMPHS ABS: 1.4 10*3/uL (ref 0.7–4.0)
LYMPHS PCT: 19 %
MCH: 28.1 pg (ref 26.0–34.0)
MCHC: 33.1 g/dL (ref 30.0–36.0)
MCV: 84.7 fL (ref 78.0–100.0)
MONO ABS: 0.6 10*3/uL (ref 0.1–1.0)
Monocytes Relative: 9 %
Neutro Abs: 5.1 10*3/uL (ref 1.7–7.7)
Neutrophils Relative %: 71 %
PLATELETS: 127 10*3/uL — AB (ref 150–400)
RBC: 3.67 MIL/uL — ABNORMAL LOW (ref 3.87–5.11)
RDW: 13.1 % (ref 11.5–15.5)
WBC: 7.2 10*3/uL (ref 4.0–10.5)

## 2016-04-02 LAB — I-STAT CHEM 8, ED
BUN: 16 mg/dL (ref 6–20)
CALCIUM ION: 1.14 mmol/L (ref 1.13–1.30)
Chloride: 104 mmol/L (ref 101–111)
Creatinine, Ser: 0.9 mg/dL (ref 0.44–1.00)
GLUCOSE: 210 mg/dL — AB (ref 65–99)
HCT: 32 % — ABNORMAL LOW (ref 36.0–46.0)
HEMOGLOBIN: 10.9 g/dL — AB (ref 12.0–15.0)
Potassium: 4 mmol/L (ref 3.5–5.1)
Sodium: 141 mmol/L (ref 135–145)
TCO2: 22 mmol/L (ref 0–100)

## 2016-04-02 LAB — I-STAT TROPONIN, ED: TROPONIN I, POC: 0 ng/mL (ref 0.00–0.08)

## 2016-04-02 LAB — COMPREHENSIVE METABOLIC PANEL
ALK PHOS: 59 U/L (ref 38–126)
ALT: 14 U/L (ref 14–54)
ANION GAP: 12 (ref 5–15)
AST: 15 U/L (ref 15–41)
Albumin: 3.3 g/dL — ABNORMAL LOW (ref 3.5–5.0)
BUN: 14 mg/dL (ref 6–20)
CALCIUM: 8.5 mg/dL — AB (ref 8.9–10.3)
CHLORIDE: 106 mmol/L (ref 101–111)
CO2: 21 mmol/L — AB (ref 22–32)
Creatinine, Ser: 0.98 mg/dL (ref 0.44–1.00)
GFR calc non Af Amer: 53 mL/min — ABNORMAL LOW (ref >60)
Glucose, Bld: 213 mg/dL — ABNORMAL HIGH (ref 65–99)
POTASSIUM: 4 mmol/L (ref 3.5–5.1)
SODIUM: 139 mmol/L (ref 135–145)
Total Bilirubin: 0.6 mg/dL (ref 0.3–1.2)
Total Protein: 5.7 g/dL — ABNORMAL LOW (ref 6.5–8.1)

## 2016-04-02 LAB — CBG MONITORING, ED: GLUCOSE-CAPILLARY: 165 mg/dL — AB (ref 65–99)

## 2016-04-02 LAB — PROTIME-INR
INR: 1.32 (ref 0.00–1.49)
Prothrombin Time: 16.5 seconds — ABNORMAL HIGH (ref 11.6–15.2)

## 2016-04-02 LAB — ABO/RH: ABO/RH(D): O POS

## 2016-04-02 LAB — TYPE AND SCREEN
ABO/RH(D): O POS
ANTIBODY SCREEN: NEGATIVE

## 2016-04-02 LAB — D-DIMER, QUANTITATIVE: D-Dimer, Quant: 3.31 ug/mL-FEU — ABNORMAL HIGH (ref 0.00–0.50)

## 2016-04-02 MED ORDER — SODIUM CHLORIDE 0.9% FLUSH
3.0000 mL | Freq: Two times a day (BID) | INTRAVENOUS | Status: DC
  Administered 2016-04-02 – 2016-04-04 (×3): 3 mL via INTRAVENOUS

## 2016-04-02 MED ORDER — HYDROXYZINE HCL 25 MG PO TABS
25.0000 mg | ORAL_TABLET | Freq: Every day | ORAL | Status: DC
  Administered 2016-04-02 – 2016-04-04 (×2): 25 mg via ORAL
  Filled 2016-04-02 (×2): qty 1

## 2016-04-02 MED ORDER — ATORVASTATIN CALCIUM 40 MG PO TABS
40.0000 mg | ORAL_TABLET | Freq: Every day | ORAL | Status: DC
  Administered 2016-04-03 – 2016-04-04 (×2): 40 mg via ORAL
  Filled 2016-04-02 (×2): qty 1

## 2016-04-02 MED ORDER — RIVAROXABAN 15 MG PO TABS
15.0000 mg | ORAL_TABLET | Freq: Every day | ORAL | Status: DC
  Administered 2016-04-02 – 2016-04-03 (×2): 15 mg via ORAL
  Filled 2016-04-02 (×3): qty 1

## 2016-04-02 MED ORDER — ACETAMINOPHEN 325 MG PO TABS
650.0000 mg | ORAL_TABLET | Freq: Four times a day (QID) | ORAL | Status: DC | PRN
  Administered 2016-04-02 – 2016-04-04 (×3): 650 mg via ORAL
  Filled 2016-04-02 (×3): qty 2

## 2016-04-02 MED ORDER — ISOSORBIDE MONONITRATE ER 30 MG PO TB24
30.0000 mg | ORAL_TABLET | Freq: Every day | ORAL | Status: DC
  Administered 2016-04-03 – 2016-04-04 (×2): 30 mg via ORAL
  Filled 2016-04-02 (×2): qty 1

## 2016-04-02 MED ORDER — ALUM & MAG HYDROXIDE-SIMETH 200-200-20 MG/5ML PO SUSP: 30.0000 mL | Freq: Four times a day (QID) | ORAL | Status: DC | PRN

## 2016-04-02 MED ORDER — SENNOSIDES-DOCUSATE SODIUM 8.6-50 MG PO TABS: 1.0000 | ORAL_TABLET | Freq: Every evening | ORAL | Status: DC | PRN

## 2016-04-02 MED ORDER — ACETAMINOPHEN 650 MG RE SUPP: 650.0000 mg | Freq: Four times a day (QID) | RECTAL | Status: DC | PRN

## 2016-04-02 MED ORDER — LEVOTHYROXINE SODIUM 75 MCG PO TABS
150.0000 ug | ORAL_TABLET | Freq: Every day | ORAL | Status: DC
  Administered 2016-04-03 – 2016-04-04 (×2): 150 ug via ORAL
  Filled 2016-04-02 (×3): qty 2

## 2016-04-02 MED ORDER — DILTIAZEM HCL ER COATED BEADS 180 MG PO CP24
180.0000 mg | ORAL_CAPSULE | Freq: Every day | ORAL | Status: DC
  Administered 2016-04-03 – 2016-04-04 (×2): 180 mg via ORAL
  Filled 2016-04-02 (×2): qty 1

## 2016-04-02 MED ORDER — MECLIZINE HCL 25 MG PO TABS: 25.0000 mg | ORAL_TABLET | Freq: Every day | ORAL | Status: DC | PRN

## 2016-04-02 MED ORDER — ONDANSETRON HCL 4 MG PO TABS: 4.0000 mg | ORAL_TABLET | Freq: Four times a day (QID) | ORAL | Status: DC | PRN

## 2016-04-02 MED ORDER — SOTALOL HCL 80 MG PO TABS
80.0000 mg | ORAL_TABLET | Freq: Two times a day (BID) | ORAL | Status: DC
  Administered 2016-04-02 – 2016-04-04 (×4): 80 mg via ORAL
  Filled 2016-04-02 (×4): qty 1

## 2016-04-02 MED ORDER — COLESEVELAM HCL 625 MG PO TABS
625.0000 mg | ORAL_TABLET | Freq: Every day | ORAL | Status: DC
  Administered 2016-04-03 – 2016-04-04 (×2): 625 mg via ORAL
  Filled 2016-04-02 (×2): qty 1

## 2016-04-02 MED ORDER — ONDANSETRON HCL 4 MG/2ML IJ SOLN: 4.0000 mg | Freq: Four times a day (QID) | INTRAMUSCULAR | Status: DC | PRN

## 2016-04-02 NOTE — ED Provider Notes (Signed)
CSN: [id number]     Arrival date & time 04/02/16  1718 History   First MD Initiated Contact with Patient 04/02/16 1736     Chief Complaint  Patient presents with  . Loss of Consciousness     (Consider location/radiation/quality/duration/timing/severity/associated sxs/prior Treatment) HPI   This is a 82 year old female brought in by EMS for syncope. She has a past medical history of lung cancer, diabetes, coronary artery disease, paroxysmal atrial fibrillation and shortness of breath. Patient states that she's been feeling weak over the past several days. She states that Sunday and Monday, 3 days ago she had several episodes of bright red blood per rectum. She states she that she figured this was from her internal hemorrhoids and has had this previously. Today, she did develop one black tarry stool. The patient is on Xarelto for her poor. Paroxysmal A. fib. She states that today she went to the kitchen to make her husband a sandwich when she began feeling very lightheaded. She sat down until she felt better. She stood up to make a sandwich without very short of breath when she sat back down. Patient states she does not remember anything that happened next, except waking up on the floor with pain in her lip. She states that she hit her face against a wooden object on the ground. She has pain in her teeth and lip but denies difficulty swallowing. She denies severe headache. Patient states she has syncopized before but had not has not had a workup.   Past Medical History  Diagnosis Date  . Lung cancer ([place])   . Stroke ([place])   . Diabetes mellitus   . CAD (coronary artery disease) 03/13/2012  . Type II or unspecified type diabetes mellitus without mention of complication, not stated as uncontrolled 03/13/2012  . Hypothyroid 03/13/2012  . HTN (hypertension) 03/13/2012  . Dysrhythmia     ATRIAL FIB  . Shortness of breath    Past Surgical History  Procedure Laterality Date  . Cardioversion  04/20/2012     Procedure: CARDIOVERSION;  Surgeon: Laverda Page, MD;  Location: [place];  Service: Cardiovascular;  Laterality: N/A;  . Abdominal hysterectomy    . Tumors      back of head  . Hemorrhoid surgery    . Jaw replacement    . Cholecystectomy    . Breast surgery      benign tumors removed in 1980  . Cardiac catheterization    . Cardioversion N/A 05/10/2013    Procedure: CARDIOVERSION;  Surgeon: Laverda Page, MD;  Location: [place];  Service: Cardiovascular;  Laterality: N/A;  . Left heart catheterization with coronary angiogram N/A 04/20/2015    Procedure: LEFT HEART CATHETERIZATION WITH CORONARY ANGIOGRAM;  Surgeon: Adrian Prows, MD;  Location: [facility] CATH LAB;  Service: Cardiovascular;  Laterality: N/A;   Family History  Problem Relation Age of Onset  . Breast cancer Mother   . Aneurysm Father     Likely thoracic aneurysm per patient  . Melanoma Brother   . Heart disease Sister   . Diabetes type II Sister    Social History  Substance Use Topics  . Smoking status: Never Smoker   . Smokeless tobacco: Never Used  . Alcohol Use: No   OB History    No data available     Review of Systems  Ten systems reviewed and are negative for acute change, except as noted in the HPI.    Allergies  Codeine and Hydrocodone-acetaminophen  Home Medications  Prior to Admission medications   Medication Sig Start Date End Date Taking? Authorizing Provider  acetaminophen (TYLENOL) 500 MG tablet Take 500 mg by mouth every 6 (six) hours as needed for moderate pain or headache.    Yes Historical Provider, MD  atorvastatin (LIPITOR) 40 MG tablet Take 1 tablet (40 mg total) by mouth daily. 04/20/15  Yes Janece Canterbury, MD  Calcium Carb-Cholecalciferol (CALCIUM-VITAMIN D3) 600-500 MG-UNIT CAPS Take 1 capsule by mouth daily.   Yes Historical Provider, MD  clobetasol (TEMOVATE) 0.05 % external solution Apply 1 application topically 2 (two) times daily. 03/31/16  Yes Historical Provider, MD   clobetasol cream (TEMOVATE) 5.09 % Apply 1 application topically 2 (two) times daily. 03/31/16  Yes Historical Provider, MD  colesevelam (WELCHOL) 625 MG tablet Take 625 mg by mouth daily.    Yes Historical Provider, MD  diltiazem (CARDIZEM CD) 180 MG 24 hr capsule Take 1 capsule (180 mg total) by mouth daily. 04/20/15  Yes Janece Canterbury, MD  hydrOXYzine (VISTARIL) 25 MG capsule Take 25 mg by mouth at bedtime. 03/31/16  Yes Historical Provider, MD  isosorbide mononitrate (IMDUR) 60 MG 24 hr tablet Take 1 tablet (60 mg total) by mouth daily. 04/20/15  Yes Janece Canterbury, MD  levothyroxine (SYNTHROID, LEVOTHROID) 150 MCG tablet Take 150 mcg by mouth daily.   Yes Historical Provider, MD  Mag Oxide-Vit D3-Turmeric (MAGNESIUM-VITAMIN D3-TURMERIC PO) Take 1 tablet by mouth daily.   Yes Historical Provider, MD  meclizine (ANTIVERT) 25 MG tablet Take 25 mg by mouth daily as needed for dizziness.    Yes Historical Provider, MD  Multiple Vitamins-Minerals (WOMENS BONE HEALTH PO) Take 1 tablet by mouth daily.   Yes Historical Provider, MD  Polyethyl Glycol-Propyl Glycol (SYSTANE) 0.4-0.3 % SOLN Apply 2 drops to eye 2 (two) times daily as needed (dry eyes).   Yes Historical Provider, MD  polyvinyl alcohol (LIQUIFILM TEARS) 1.4 % ophthalmic solution Place 2 drops into both eyes as needed for dry eyes.    Yes Historical Provider, MD  Rivaroxaban (XARELTO) 15 MG TABS tablet Take 15 mg by mouth daily with supper. 03/16/12  Yes Adrian Prows, MD  sotalol (BETAPACE) 80 MG tablet Take 80 mg by mouth 2 (two) times daily.   Yes Historical Provider, MD  traMADol (ULTRAM) 50 MG tablet Take 1 tablet (50 mg total) by mouth every 6 (six) hours as needed. 03/15/16   Olivia Canter Sam, PA-C  triamcinolone (KENALOG) 0.025 % ointment Apply 1 application topically 2 (two) times daily. Do not apply to face 03/28/16   Montine Circle, PA-C   BP 136/69 mmHg  Pulse 82  Temp(Src) 97.7 F (36.5 C) (Oral)  Resp 20  Ht '5\' 3"'$  (1.6 m)  Wt 53.751  kg  BMI 21.00 kg/m2  SpO2 98% Physical Exam  Constitutional: She is oriented to person, place, and time. She appears well-developed and well-nourished. No distress.  HENT:  Head: Normocephalic.  Large hematoma on the right upper lip, no laceration on the posterior lip, teeth are firmly in place without fractures, discoloration or bleeding. Oropharynx is patent. Strong bite without severe pain. No epistaxis, no facial bones with step-off or crepitus.  Eyes: Conjunctivae are normal. No scleral icterus.  Neck: Normal range of motion.  Cardiovascular: Normal rate and regular rhythm.  Exam reveals no gallop and no friction rub.   Murmur heard. Soft systolic crescendo murmur heard best in the second ICS consistent with aortic stenosis  Pulmonary/Chest: Effort normal and breath sounds normal. No respiratory  distress.  Abdominal: Soft. Bowel sounds are normal. She exhibits no distension and no mass. There is no tenderness. There is no guarding.  Genitourinary:  Digital Rectal Exam reveals sphincter with good tone. No external hemorrhoids. No masses or fissures. Stool color is brown with no overt blood.'   Neurological: She is alert and oriented to person, place, and time.  Skin: Skin is warm and dry. She is not diaphoretic.    ED Course  Procedures (including critical care time) Labs Review Labs Reviewed  CBC WITH DIFFERENTIAL/PLATELET - Abnormal; Notable for the following:    RBC 3.67 (*)    Hemoglobin 10.3 (*)    HCT 31.1 (*)    Platelets 127 (*)    All other components within normal limits  COMPREHENSIVE METABOLIC PANEL - Abnormal; Notable for the following:    CO2 21 (*)    Glucose, Bld 213 (*)    Calcium 8.5 (*)    Total Protein 5.7 (*)    Albumin 3.3 (*)    GFR calc non Af Amer 53 (*)    All other components within normal limits  PROTIME-INR - Abnormal; Notable for the following:    Prothrombin Time 16.5 (*)    All other components within normal limits  D-DIMER, QUANTITATIVE  (NOT AT [facility]) - Abnormal; Notable for the following:    D-Dimer, Quant 3.31 (*)    All other components within normal limits  I-STAT CHEM 8, ED - Abnormal; Notable for the following:    Glucose, Bld 210 (*)    Hemoglobin 10.9 (*)    HCT 32.0 (*)    All other components within normal limits  CBG MONITORING, ED - Abnormal; Notable for the following:    Glucose-Capillary 165 (*)    All other components within normal limits  OCCULT BLOOD X 1 CARD TO LAB, STOOL  URINALYSIS, ROUTINE W REFLEX MICROSCOPIC (NOT AT [facility])  COMPREHENSIVE METABOLIC PANEL  CBC  PROTIME-INR  BRAIN NATRIURETIC PEPTIDE  POCT CBG (FASTING - GLUCOSE)-MANUAL ENTRY  I-STAT TROPOININ, ED  TYPE AND SCREEN  ABO/RH    Imaging Review Dg Chest 2 View  04/02/2016  CLINICAL DATA:  Shortness of breath and syncope. EXAM: CHEST - 2 VIEW COMPARISON:  12/19/2015 FINDINGS: The heart size and mediastinal contours are within normal limits. Stable probable chronic lung disease with suggestion of mild bilateral pulmonary hyperinflation. There is no evidence of pulmonary edema, consolidation, pneumothorax, nodule or pleural fluid. The thoracic spine shows osteopenia and spondylosis without visible fracture. IMPRESSION: No active disease.  Chronic lung disease.  No acute findings. Electronically Signed   By: Aletta Edouard M.D.   On: 04/02/2016 18:17   Ct Head Wo Contrast  04/02/2016  CLINICAL DATA:  Recent fall with facial injury EXAM: CT HEAD WITHOUT CONTRAST CT MAXILLOFACIAL WITHOUT CONTRAST TECHNIQUE: Multidetector CT imaging of the head and maxillofacial structures were performed using the standard protocol without intravenous contrast. Multiplanar CT image reconstructions of the maxillofacial structures were also generated. COMPARISON:  10/02/2012 FINDINGS: CT HEAD FINDINGS The bony calvarium is intact. Mild atrophic changes are noted commenced with the patient's given age. No findings to suggest acute hemorrhage, acute infarction or  space-occupying mass lesion are noted. CT MAXILLOFACIAL FINDINGS Bony structures of the face show no acute fracture. Prior surgical changes are noted posterior to the right zygomatic arch. The orbits and their contents are within normal limits. The paranasal sinuses are unremarkable. Soft tissue swelling is noted below the nose centrally and eccentric to the  right consistent with the recent injury. Degenerative changes of the temporomandibular joints are seen. No blowout fracture is identified. The visualized cervical spine shows degenerative change. No acute for abnormality is noted. IMPRESSION: CT of the head:  No acute intracranial abnormality noted. CT of the maxillofacial bones: No acute bony abnormality is noted. Soft tissue swelling over the right upper lip is seen consistent with the recent injury. Electronically Signed   By: Inez Catalina M.D.   On: 04/02/2016 19:11   Ct Maxillofacial Wo Cm  04/02/2016  CLINICAL DATA:  Recent fall with facial injury EXAM: CT HEAD WITHOUT CONTRAST CT MAXILLOFACIAL WITHOUT CONTRAST TECHNIQUE: Multidetector CT imaging of the head and maxillofacial structures were performed using the standard protocol without intravenous contrast. Multiplanar CT image reconstructions of the maxillofacial structures were also generated. COMPARISON:  10/02/2012 FINDINGS: CT HEAD FINDINGS The bony calvarium is intact. Mild atrophic changes are noted commenced with the patient's given age. No findings to suggest acute hemorrhage, acute infarction or space-occupying mass lesion are noted. CT MAXILLOFACIAL FINDINGS Bony structures of the face show no acute fracture. Prior surgical changes are noted posterior to the right zygomatic arch. The orbits and their contents are within normal limits. The paranasal sinuses are unremarkable. Soft tissue swelling is noted below the nose centrally and eccentric to the right consistent with the recent injury. Degenerative changes of the temporomandibular joints  are seen. No blowout fracture is identified. The visualized cervical spine shows degenerative change. No acute for abnormality is noted. IMPRESSION: CT of the head:  No acute intracranial abnormality noted. CT of the maxillofacial bones: No acute bony abnormality is noted. Soft tissue swelling over the right upper lip is seen consistent with the recent injury. Electronically Signed   By: Inez Catalina M.D.   On: 04/02/2016 19:11   I have personally reviewed and evaluated these images and lab results as part of my medical decision-making.   EKG Interpretation   Date/Time:  Wednesday April 02 2016 17:28:41 EDT Ventricular Rate:  87 PR Interval:    QRS Duration: 85 QT Interval:  320 QTC Calculation: 385 R Axis:   69 Text Interpretation:  Atrial fibrillation Low voltage, extremity leads  Borderline repolarization abnormality Confirmed by GOLDSTON  MD, SCOTT  ([id number]) on 04/02/2016 5:49:01 PM      MDM   Final diagnoses:  Syncope and collapse    12:57 AM BP 136/69 mmHg  Pulse 82  Temp(Src) 97.7 F (36.5 C) (Oral)  Resp 20  Ht '5\' 3"'$  (1.6 m)  Wt 53.751 kg  BMI 21.00 kg/m2  SpO2 98% Patient here with syncopal episode. No apparent prodrome prior to syncope. Her fecal occult blood test is negative.    Patient workup shows rate controlled afib. D dimer is elevated . The patient will need a CT angiogram rule out pulmonary embolus. Her labs. To be at baseline. Otherwise, and CT imaging of the head and facial region are negative. The patient will be admitted by the hospitalist for further workup and telemetry monitoring.   Margarita Mail, PA-C 04/03/16 0101  Sherwood Gambler, MD 04/04/16 [phone]

## 2016-04-02 NOTE — ED Notes (Signed)
Pt arrives via EMS from home. Pt got up to fix a sandwich got lightheaded and short of breath and lost consciousness. Pt appears to have hit her mouth. Pt alert and oriented on arrival to ED.

## 2016-04-02 NOTE — H&P (Signed)
[facility] History and Physical   Patient: [name]   PCP: Dwan Bolt, MD DOB: 1934-11-30   DOA: 04/02/2016   DOS: 04/02/2016   DOS: the patient was seen and examined on 04/02/2016  Referring physician: Dr. Regenia Skeeter Chief Complaint: Syncope  HPI: [name] is a 82 y.o. female with Past medical history of type 2 diabetes mellitus, coronary artery disease, hypothyroidism, essential hypertension, A. fib on anticoagulation. The patient is presenting with an episode of unresponsiveness. Patient mentions that while she was in her kitchen to make her husband decide which she felt lightheaded. She initially sat down and felt better but then started having significant shortness of breath when going to the kitchen and then talk to sit down again. The next and that she remembers is her husband was trying to wake her up from the floor. She had significant bleeding from her lips and she was somewhat tired. At the time of my evaluation the patient denied having any complaints of dizziness or lightheadedness or focal deficit or chest pain or palpitation or nausea or vomiting or diarrhea or burning urination. Patient mentions that she did have similar episode earlier in the day with increased shortness of breath. She has been having some fatigue and tiredness as well. Recently she was seen in the ER for a rash for which she was given some cream. Patient also presented with a fall in the beginning of the March and was given tramadol.  The patient is coming from home.  At her baseline ambulates without a walker And is independent for most of her ADL; manages her medication on her own.  Review of Systems: as mentioned in the history of present illness.  A comprehensive review of the other systems is negative.  Past Medical History  Diagnosis Date  . Lung cancer ([place])   . Stroke ([place])   . Diabetes mellitus   . CAD (coronary artery disease) 03/13/2012  . Type II or  unspecified type diabetes mellitus without mention of complication, not stated as uncontrolled 03/13/2012  . Hypothyroid 03/13/2012  . HTN (hypertension) 03/13/2012  . Dysrhythmia     ATRIAL FIB  . Shortness of breath    Past Surgical History  Procedure Laterality Date  . Cardioversion  04/20/2012    Procedure: CARDIOVERSION;  Surgeon: Laverda Page, MD;  Location: [place];  Service: Cardiovascular;  Laterality: N/A;  . Abdominal hysterectomy    . Tumors      back of head  . Hemorrhoid surgery    . Jaw replacement    . Cholecystectomy    . Breast surgery      benign tumors removed in 1980  . Cardiac catheterization    . Cardioversion N/A 05/10/2013    Procedure: CARDIOVERSION;  Surgeon: Laverda Page, MD;  Location: [place];  Service: Cardiovascular;  Laterality: N/A;  . Left heart catheterization with coronary angiogram N/A 04/20/2015    Procedure: LEFT HEART CATHETERIZATION WITH CORONARY ANGIOGRAM;  Surgeon: Adrian Prows, MD;  Location: [facility] CATH LAB;  Service: Cardiovascular;  Laterality: N/A;   Social History:  reports that she has never smoked. She has never used smokeless tobacco. She reports that she does not drink alcohol or use illicit drugs.  Allergies  Allergen Reactions  . Codeine Nausea And Vomiting  . Hydrocodone-Acetaminophen Nausea And Vomiting    Family History  Problem Relation Age of Onset  . Breast cancer Mother   . Aneurysm Father     Likely  thoracic aneurysm per patient  . Melanoma Brother   . Heart disease Sister   . Diabetes type II Sister     Prior to Admission medications   Medication Sig Start Date End Date Taking? Authorizing Provider  acetaminophen (TYLENOL) 500 MG tablet Take 500 mg by mouth every 6 (six) hours as needed for moderate pain or headache.    Yes Historical Provider, MD  atorvastatin (LIPITOR) 40 MG tablet Take 1 tablet (40 mg total) by mouth daily. 04/20/15  Yes Janece Canterbury, MD  Calcium Carb-Cholecalciferol (CALCIUM-VITAMIN  D3) 600-500 MG-UNIT CAPS Take 1 capsule by mouth daily.   Yes Historical Provider, MD  clobetasol (TEMOVATE) 0.05 % external solution Apply 1 application topically 2 (two) times daily. 03/31/16  Yes Historical Provider, MD  clobetasol cream (TEMOVATE) 4.16 % Apply 1 application topically 2 (two) times daily. 03/31/16  Yes Historical Provider, MD  colesevelam (WELCHOL) 625 MG tablet Take 625 mg by mouth daily.    Yes Historical Provider, MD  diltiazem (CARDIZEM CD) 180 MG 24 hr capsule Take 1 capsule (180 mg total) by mouth daily. 04/20/15  Yes Janece Canterbury, MD  hydrOXYzine (VISTARIL) 25 MG capsule Take 25 mg by mouth at bedtime. 03/31/16  Yes Historical Provider, MD  isosorbide mononitrate (IMDUR) 60 MG 24 hr tablet Take 1 tablet (60 mg total) by mouth daily. 04/20/15  Yes Janece Canterbury, MD  levothyroxine (SYNTHROID, LEVOTHROID) 150 MCG tablet Take 150 mcg by mouth daily.   Yes Historical Provider, MD  Mag Oxide-Vit D3-Turmeric (MAGNESIUM-VITAMIN D3-TURMERIC PO) Take 1 tablet by mouth daily.   Yes Historical Provider, MD  meclizine (ANTIVERT) 25 MG tablet Take 25 mg by mouth daily as needed for dizziness.    Yes Historical Provider, MD  Multiple Vitamins-Minerals (WOMENS BONE HEALTH PO) Take 1 tablet by mouth daily.   Yes Historical Provider, MD  Polyethyl Glycol-Propyl Glycol (SYSTANE) 0.4-0.3 % SOLN Apply 2 drops to eye 2 (two) times daily as needed (dry eyes).   Yes Historical Provider, MD  polyvinyl alcohol (LIQUIFILM TEARS) 1.4 % ophthalmic solution Place 2 drops into both eyes as needed for dry eyes.    Yes Historical Provider, MD  Rivaroxaban (XARELTO) 15 MG TABS tablet Take 15 mg by mouth daily with supper. 03/16/12  Yes Adrian Prows, MD  sotalol (BETAPACE) 80 MG tablet Take 80 mg by mouth 2 (two) times daily.   Yes Historical Provider, MD  traMADol (ULTRAM) 50 MG tablet Take 1 tablet (50 mg total) by mouth every 6 (six) hours as needed. 03/15/16   Olivia Canter Sam, PA-C  triamcinolone (KENALOG) 0.025 %  ointment Apply 1 application topically 2 (two) times daily. Do not apply to face 03/28/16   Montine Circle, PA-C    Physical Exam: Filed Vitals:   04/02/16 2115 04/02/16 2130 04/02/16 2145 04/02/16 2200  BP: 112/57 113/57 115/72 114/60  Pulse: 87 82 85 93  Temp:      TempSrc:      Resp: '27 23 21 23  '$ SpO2: 98% 98% 96% 95%    General: Alert, Awake and Oriented to Time, Place and Person. Appear in mild distress Eyes: PERRL ENT: Oral Mucosa clear, moist, injury to upper lips with hematoma. Neck: no JVD Cardiovascular: S1 and S2 Present, aortic systolic Murmur, Peripheral Pulses Present Respiratory: Bilateral Air entry equal and Decreased,  Clear to Auscultation, no Crackles, no wheezes Abdomen: Bowel Sound present, Soft and no tenderness Skin: no Rash Extremities: no Pedal edema, no calf tenderness Neurologic: Grossly no  focal neuro deficit.  Labs on Admission:  CBC:  Recent Labs Lab 04/02/16 2007 04/02/16 2009  WBC  --  7.2  NEUTROABS  --  5.1  HGB 10.9* 10.3*  HCT 32.0* 31.1*  MCV  --  84.7  PLT  --  127*    CMP     Component Value Date/Time   NA 139 04/02/2016 2009   K 4.0 04/02/2016 2009   CL 106 04/02/2016 2009   CO2 21* 04/02/2016 2009   GLUCOSE 213* 04/02/2016 2009   BUN 14 04/02/2016 2009   CREATININE 0.98 04/02/2016 2009   CALCIUM 8.5* 04/02/2016 2009   PROT 5.7* 04/02/2016 2009   ALBUMIN 3.3* 04/02/2016 2009   AST 15 04/02/2016 2009   ALT 14 04/02/2016 2009   ALKPHOS 59 04/02/2016 2009   BILITOT 0.6 04/02/2016 2009   GFRNONAA 53* 04/02/2016 2009   GFRAA >60 04/02/2016 2009    No results for input(s): CKTOTAL, CKMB, CKMBINDEX, TROPONINI in the last 168 hours. BNP (last 3 results)  Recent Labs  04/17/15 1459  BNP 302.0*    ProBNP (last 3 results) No results for input(s): PROBNP in the last 8760 hours.   Radiological Exams on Admission: Dg Chest 2 View  04/02/2016  CLINICAL DATA:  Shortness of breath and syncope. EXAM: CHEST - 2 VIEW  COMPARISON:  12/19/2015 FINDINGS: The heart size and mediastinal contours are within normal limits. Stable probable chronic lung disease with suggestion of mild bilateral pulmonary hyperinflation. There is no evidence of pulmonary edema, consolidation, pneumothorax, nodule or pleural fluid. The thoracic spine shows osteopenia and spondylosis without visible fracture. IMPRESSION: No active disease.  Chronic lung disease.  No acute findings. Electronically Signed   By: Aletta Edouard M.D.   On: 04/02/2016 18:17   Ct Head Wo Contrast  04/02/2016  CLINICAL DATA:  Recent fall with facial injury EXAM: CT HEAD WITHOUT CONTRAST CT MAXILLOFACIAL WITHOUT CONTRAST TECHNIQUE: Multidetector CT imaging of the head and maxillofacial structures were performed using the standard protocol without intravenous contrast. Multiplanar CT image reconstructions of the maxillofacial structures were also generated. COMPARISON:  10/02/2012 FINDINGS: CT HEAD FINDINGS The bony calvarium is intact. Mild atrophic changes are noted commenced with the patient's given age. No findings to suggest acute hemorrhage, acute infarction or space-occupying mass lesion are noted. CT MAXILLOFACIAL FINDINGS Bony structures of the face show no acute fracture. Prior surgical changes are noted posterior to the right zygomatic arch. The orbits and their contents are within normal limits. The paranasal sinuses are unremarkable. Soft tissue swelling is noted below the nose centrally and eccentric to the right consistent with the recent injury. Degenerative changes of the temporomandibular joints are seen. No blowout fracture is identified. The visualized cervical spine shows degenerative change. No acute for abnormality is noted. IMPRESSION: CT of the head:  No acute intracranial abnormality noted. CT of the maxillofacial bones: No acute bony abnormality is noted. Soft tissue swelling over the right upper lip is seen consistent with the recent injury.  Electronically Signed   By: Inez Catalina M.D.   On: 04/02/2016 19:11   Ct Maxillofacial Wo Cm  04/02/2016  CLINICAL DATA:  Recent fall with facial injury EXAM: CT HEAD WITHOUT CONTRAST CT MAXILLOFACIAL WITHOUT CONTRAST TECHNIQUE: Multidetector CT imaging of the head and maxillofacial structures were performed using the standard protocol without intravenous contrast. Multiplanar CT image reconstructions of the maxillofacial structures were also generated. COMPARISON:  10/02/2012 FINDINGS: CT HEAD FINDINGS The bony calvarium is intact. Mild  atrophic changes are noted commenced with the patient's given age. No findings to suggest acute hemorrhage, acute infarction or space-occupying mass lesion are noted. CT MAXILLOFACIAL FINDINGS Bony structures of the face show no acute fracture. Prior surgical changes are noted posterior to the right zygomatic arch. The orbits and their contents are within normal limits. The paranasal sinuses are unremarkable. Soft tissue swelling is noted below the nose centrally and eccentric to the right consistent with the recent injury. Degenerative changes of the temporomandibular joints are seen. No blowout fracture is identified. The visualized cervical spine shows degenerative change. No acute for abnormality is noted. IMPRESSION: CT of the head:  No acute intracranial abnormality noted. CT of the maxillofacial bones: No acute bony abnormality is noted. Soft tissue swelling over the right upper lip is seen consistent with the recent injury. Electronically Signed   By: Inez Catalina M.D.   On: 04/02/2016 19:11   EKG: Independently reviewed. normal sinus rhythm, nonspecific ST and T waves changes, prolonged QT interval.  Assessment/Plan 1. Syncope The patient presents with complaints of a passing out episode. Most likely cardiac syncope. Patient was monitored in telemetry. Monitor serial neuro checks as well. Next and orthostatic vitals in the morning. With a positive d-dimer and  complained of increased shortness of breath with the episodes I would also check a CT PE protocol. Check echocardiogram in the morning with a history of aortic systolic murmur as well as prior history of aortic stenosis. I would reduce her Imdur to half. Continue rest of the medication.  2. Upper lip hematoma. No active bleeding reported. Continue anticoagulation.  3. History of Stroke [facility])   Atrial fibrillation (HCC) CHA2DS2-VASc Score 7 Continue anticoagulation. Continue sotalol. Avoid QT prolonging medication. Patient is on beta blocker sotalol as well as Cardizem which might cause AV block excessively. Monitor clinically. Primary cardiologist Dr. Nadyne Coombes.  4.  Hypothyroid Continue Synthroid.  5.  DM (diabetes mellitus), type 2, uncontrolled, with renal complications (HCC) Not on any medication. Monitor clinically.  Nutrition: Heart healthy diet DVT Prophylaxis: on therapeutic anticoagulation.  Advance goals of care discussion: Full code   Consults: None  Family Communication: no family was present at bedside, at the time of interview.  Disposition: Admitted as observation, telemetry unit.  Author: Berle Mull, MD [facility] Pager: [phone] 04/02/2016  If 7PM-7AM, please contact night-coverage [email or web address] Password [email or web address]

## 2016-04-03 ENCOUNTER — Encounter (HOSPITAL_COMMUNITY): Payer: Self-pay | Admitting: Radiology

## 2016-04-03 ENCOUNTER — Observation Stay (HOSPITAL_COMMUNITY): Payer: Medicare Other

## 2016-04-03 DIAGNOSIS — E1129 Type 2 diabetes mellitus with other diabetic kidney complication: Secondary | ICD-10-CM | POA: Diagnosis not present

## 2016-04-03 DIAGNOSIS — E039 Hypothyroidism, unspecified: Secondary | ICD-10-CM | POA: Diagnosis not present

## 2016-04-03 DIAGNOSIS — R55 Syncope and collapse: Secondary | ICD-10-CM | POA: Diagnosis not present

## 2016-04-03 DIAGNOSIS — R0602 Shortness of breath: Secondary | ICD-10-CM | POA: Diagnosis not present

## 2016-04-03 DIAGNOSIS — I639 Cerebral infarction, unspecified: Secondary | ICD-10-CM | POA: Diagnosis not present

## 2016-04-03 DIAGNOSIS — I482 Chronic atrial fibrillation: Secondary | ICD-10-CM | POA: Diagnosis not present

## 2016-04-03 LAB — GLUCOSE, CAPILLARY
GLUCOSE-CAPILLARY: 181 mg/dL — AB (ref 65–99)
GLUCOSE-CAPILLARY: 211 mg/dL — AB (ref 65–99)
GLUCOSE-CAPILLARY: 165 mg/dL — AB (ref 65–99)
Glucose-Capillary: 193 mg/dL — ABNORMAL HIGH (ref 65–99)

## 2016-04-03 LAB — ECHOCARDIOGRAM COMPLETE
Height: 63 in
WEIGHTICAEL: 1896 [oz_av]

## 2016-04-03 LAB — COMPREHENSIVE METABOLIC PANEL
ALK PHOS: 52 U/L (ref 38–126)
ALT: 13 U/L — ABNORMAL LOW (ref 14–54)
ANION GAP: 13 (ref 5–15)
AST: 14 U/L — ABNORMAL LOW (ref 15–41)
Albumin: 2.9 g/dL — ABNORMAL LOW (ref 3.5–5.0)
BILIRUBIN TOTAL: 0.7 mg/dL (ref 0.3–1.2)
BUN: 14 mg/dL (ref 6–20)
CALCIUM: 8.3 mg/dL — AB (ref 8.9–10.3)
CO2: 20 mmol/L — ABNORMAL LOW (ref 22–32)
Chloride: 106 mmol/L (ref 101–111)
Creatinine, Ser: 1.08 mg/dL — ABNORMAL HIGH (ref 0.44–1.00)
GFR calc non Af Amer: 48 mL/min — ABNORMAL LOW (ref >60)
GFR, EST AFRICAN AMERICAN: 55 mL/min — AB (ref >60)
Glucose, Bld: 173 mg/dL — ABNORMAL HIGH (ref 65–99)
Potassium: 3.5 mmol/L (ref 3.5–5.1)
Sodium: 139 mmol/L (ref 135–145)
TOTAL PROTEIN: 5.2 g/dL — AB (ref 6.5–8.1)

## 2016-04-03 LAB — PROTIME-INR
INR: 2.92 — AB (ref 0.00–1.49)
PROTHROMBIN TIME: 30 s — AB (ref 11.6–15.2)

## 2016-04-03 LAB — URINALYSIS, ROUTINE W REFLEX MICROSCOPIC
Bilirubin Urine: NEGATIVE
Glucose, UA: NEGATIVE mg/dL
HGB URINE DIPSTICK: NEGATIVE
Ketones, ur: NEGATIVE mg/dL
LEUKOCYTES UA: NEGATIVE
Nitrite: NEGATIVE
PROTEIN: NEGATIVE mg/dL
SPECIFIC GRAVITY, URINE: 1.015 (ref 1.005–1.030)
pH: 5.5 (ref 5.0–8.0)

## 2016-04-03 LAB — CBC
HCT: 29.4 % — ABNORMAL LOW (ref 36.0–46.0)
HEMOGLOBIN: 9.6 g/dL — AB (ref 12.0–15.0)
MCH: 27.9 pg (ref 26.0–34.0)
MCHC: 32.7 g/dL (ref 30.0–36.0)
MCV: 85.5 fL (ref 78.0–100.0)
PLATELETS: 107 10*3/uL — AB (ref 150–400)
RBC: 3.44 MIL/uL — AB (ref 3.87–5.11)
RDW: 13.3 % (ref 11.5–15.5)
WBC: 7 10*3/uL (ref 4.0–10.5)

## 2016-04-03 LAB — BRAIN NATRIURETIC PEPTIDE: B Natriuretic Peptide: 204.8 pg/mL — ABNORMAL HIGH (ref 0.0–100.0)

## 2016-04-03 LAB — MAGNESIUM: MAGNESIUM: 1 mg/dL — AB (ref 1.7–2.4)

## 2016-04-03 MED ORDER — MAGNESIUM SULFATE 2 GM/50ML IV SOLN
2.0000 g | Freq: Once | INTRAVENOUS | Status: AC
  Administered 2016-04-03: 2 g via INTRAVENOUS
  Filled 2016-04-03: qty 50

## 2016-04-03 MED ORDER — INSULIN ASPART 100 UNIT/ML ~~LOC~~ SOLN
0.0000 [IU] | Freq: Three times a day (TID) | SUBCUTANEOUS | Status: DC
  Administered 2016-04-03: 3 [IU] via SUBCUTANEOUS
  Administered 2016-04-03 – 2016-04-04 (×3): 2 [IU] via SUBCUTANEOUS

## 2016-04-03 MED ORDER — IOPAMIDOL (ISOVUE-370) INJECTION 76%
INTRAVENOUS | Status: AC
  Administered 2016-04-03: 100 mL
  Filled 2016-04-03: qty 100

## 2016-04-03 NOTE — Progress Notes (Signed)
Inpatient Diabetes Program Recommendations  AACE/ADA: New Consensus Statement on Inpatient Glycemic Control (2015)  Target Ranges:  Prepandial:   less than 140 mg/dL      Peak postprandial:   less than 180 mg/dL (1-2 hours)      Critically ill patients:  140 - 180 mg/dL  Results for SHREE, ESPEY (MRN [id number]) as of 04/03/2016 09:10  Ref. Range 04/02/2016 22:09 04/03/2016 06:18  Glucose-Capillary Latest Ref Range: 65-99 mg/dL 165 (H) 181 (H)   Review of Glycemic Control  Diabetes history: DM2 Outpatient Diabetes medications: None Current orders for Inpatient glycemic control: None  Inpatient Diabetes Program Recommendations: Correction (SSI): Please order CBGs with Novolog correction scale ACHS. HgbA1C: Please consider ordering an A1C to evaluate glycemic control over the past 2-3 months.  Thanks, Barnie Alderman, RN, MSN, CDE Diabetes Coordinator Inpatient Diabetes Program [phone] (Team Pager from [place] to [place]) [phone] ([place] office) [phone] [facility] office) [phone] [facility] office)

## 2016-04-03 NOTE — Evaluation (Signed)
Physical Therapy Evaluation Patient Details Name: [name] MRN: [id number] DOB: 06-22-1934 Today's Date: 04/03/2016   History of Present Illness  [name] is a 82 y.o. female with Past medical history of type 2 diabetes mellitus, coronary artery disease, hypothyroidism, essential hypertension, A. fib on anticoagulation, CAD, stage IV low grade neuroendocrine tumor.  She was admitted due to syncopal episode at home and now with upper lip hematoma.  Clinical Impression  Patient presents with decreased independence and safety with mobility due to deficits listed in PT problem list.  She will benefit from skilled PT in the acute setting to allow return home with spouse assist and follow up HHPT.      Follow Up Recommendations Home health PT;Supervision/Assistance - 24 hour    Equipment Recommendations  Rolling walker with 5" wheels    Recommendations for Other Services       Precautions / Restrictions Precautions Precautions: Fall      Mobility  Bed Mobility Overal bed mobility: Needs Assistance Bed Mobility: Supine to Sit;Sit to Supine     Supine to sit: HOB elevated;Supervision Sit to supine: Min assist   General bed mobility comments: slow to come up to EOB with increased effort needed; to supine assist for R LE  Transfers Overall transfer level: Needs assistance Equipment used: Rolling walker (2 wheeled) Transfers: Sit to/from Stand Sit to Stand: Min guard         General transfer comment: up from EOB assist for balance/safety  Ambulation/Gait Ambulation/Gait assistance: Supervision;Min guard Ambulation Distance (Feet): 140 Feet Assistive device: Rolling walker (2 wheeled) Gait Pattern/deviations: Step-through pattern;Decreased stride length;Antalgic     General Gait Details: antalgic on R, cues for upright posture, increased use of UE support for painful R LE, improved with increased distance, pt reporting less pain  Stairs             Wheelchair Mobility    Modified Rankin (Stroke Patients Only)       Balance Overall balance assessment: Needs assistance Sitting-balance support: No upper extremity supported Sitting balance-Leahy Scale: Good     Standing balance support: Bilateral upper extremity supported Standing balance-Leahy Scale: Poor Standing balance comment: UE support needed for balance                             Pertinent Vitals/Pain Pain Assessment: 0-10 Pain Score: 8  Pain Location: R hip with movement Pain Descriptors / Indicators: Aching;Sore Pain Intervention(s): Limited activity within patient's tolerance;Monitored during session;Repositioned    Home Living Family/patient expects to be discharged to:: Private residence Living Arrangements: Spouse/significant other Available Help at Discharge: Family;Available PRN/intermittently Type of Home: House Home Access: Stairs to enter Entrance Stairs-Rails: Left;Right;Can reach both Entrance Stairs-Number of Steps: 8 Home Layout: One level Home Equipment: Cane - single point;Shower seat;Bedside commode      Prior Function Level of Independence: Independent               Hand Dominance        Extremity/Trunk Assessment               Lower Extremity Assessment: RLE deficits/detail;LLE deficits/detail RLE Deficits / Details: AAROM WFL, strength hip flexion 2/5, knee ext 3/5 with pain (so no MMT) LLE Deficits / Details: AROM WFL, strength at least 4/5     Communication   Communication: No difficulties  Cognition Arousal/Alertness: Awake/alert Behavior During Therapy: WFL for tasks assessed/performed Overall Cognitive Status: Within Functional  Limits for tasks assessed                      General Comments General comments (skin integrity, edema, etc.): Patient with brusing on upper lip and down side of R mouth crease    Exercises General Exercises - Lower Extremity Ankle Circles/Pumps:  AROM;Both;5 reps;Supine Heel Slides: AAROM;Right;5 reps;Supine      Assessment/Plan    PT Assessment    PT Diagnosis Acute pain;Generalized weakness;Difficulty walking   PT Problem List    PT Treatment Interventions     PT Goals (Current goals can be found in the Care Plan section) Acute Rehab PT Goals Patient Stated Goal: To go home PT Goal Formulation: With patient Time For Goal Achievement: 04/07/16 Potential to Achieve Goals: Good    Frequency     Barriers to discharge        Co-evaluation               End of Session Equipment Utilized During Treatment: Gait belt Activity Tolerance: Patient limited by pain Patient left: in bed;with call bell/phone within reach;with bed alarm set      Functional Assessment Tool Used: Clinical Judgement Functional Limitation: Mobility: Walking and moving around Mobility: Walking and Moving Around Current Status (Y7829): At least 20 percent but less than 40 percent impaired, limited or restricted Mobility: Walking and Moving Around Goal Status [phone]): At least 1 percent but less than 20 percent impaired, limited or restricted    Time: 1020-1046 PT Time Calculation (min) (ACUTE ONLY): 26 min   Charges:   PT Evaluation $PT Eval Moderate Complexity: 1 Procedure PT Treatments $Gait Training: 8-22 mins   PT G Codes:   PT G-Codes **NOT FOR INPATIENT CLASS** Functional Assessment Tool Used: Clinical Judgement Functional Limitation: Mobility: Walking and moving around Mobility: Walking and Moving Around Current Status (Y8657): At least 20 percent but less than 40 percent impaired, limited or restricted Mobility: Walking and Moving Around Goal Status [phone]): At least 1 percent but less than 20 percent impaired, limited or restricted    Reginia Naas 04/03/2016, 11:42 AM  Magda Kiel, [place] 04/03/2016

## 2016-04-03 NOTE — Care Management Obs Status (Signed)
MEDICARE OBSERVATION STATUS NOTIFICATION   Patient Details  Name: [name] MRN: [id number] Date of Birth: February 21, 1934   Medicare Observation Status Notification Given:  Yes    Dawayne Patricia, RN 04/03/2016, 11:14 AM

## 2016-04-03 NOTE — Consult Note (Signed)
CARDIOLOGY CONSULT NOTE  Patient ID: [name] MRN: [id number] DOB/AGE: 1934/04/14 82 y.o.  Admit date: 04/02/2016 Referring Physician: Internal Medicine  Primary Physician:  Dwan Bolt, MD Reason for Consultation: syncope  HPI: [name]  is a 82 y.o. female with a history of rare form of low-grade lung carcinoid (followed at [facility]), permanent a.fib on Xarelto, hyperlipidemia, HTN, DM, hypothyroidism, and non critical CAD by cath on 04/20/2015.  She presented on 04/02/2016 for evaluation of syncope. She was standing in her kitchen when she became lightheaded. She sat down and symptoms began to improve so she stood back up again and lightheadedness returned then she woke up on the floor. She denies any chest pain or palpitations but had reported fatigue and shortness of breath earlier in the day.   Patient has chronic shortness of breath and dyspnea on exertion, has very slow-growing carcinoid tumor. Patient has gradually noticed deterioration in general health. She also complains of frequent loose stools, was evaluated by Dr. Michail Sermon who told her that is diverticulosis. Patient did feel lightheaded and weak along with shortness of breath prior to episode of feeling that she is going to pass out and prior to frank episode of syncope.  D-dimer positive, therefore chest CT was performed which was negative for DVT. No acute abnormalities noted on head CT. Echocardiogram revealed normal LVEF with no significant changes. No significant arrhythmias, aside from chronic a.fib, or heart block noted on telemetry.  Past Medical History  Diagnosis Date  . Lung cancer ([place])   . Stroke ([place])   . Diabetes mellitus   . CAD (coronary artery disease) 03/13/2012  . Type II or unspecified type diabetes mellitus without mention of complication, not stated as uncontrolled 03/13/2012  . Hypothyroid 03/13/2012  . HTN (hypertension) 03/13/2012  . Dysrhythmia     ATRIAL FIB  . Shortness of  breath   . Syncope and collapse 04/02/2016     Past Surgical History  Procedure Laterality Date  . Cardioversion  04/20/2012    Procedure: CARDIOVERSION;  Surgeon: Laverda Page, MD;  Location: [facility];  Service: Cardiovascular;  Laterality: N/A;  . Abdominal hysterectomy    . Tumors      back of head  . Hemorrhoid surgery    . Jaw replacement    . Cholecystectomy    . Breast surgery      benign tumors removed in 1980  . Cardiac catheterization    . Cardioversion N/A 05/10/2013    Procedure: CARDIOVERSION;  Surgeon: Laverda Page, MD;  Location: [facility];  Service: Cardiovascular;  Laterality: N/A;  . Left heart catheterization with coronary angiogram N/A 04/20/2015    Procedure: LEFT HEART CATHETERIZATION WITH CORONARY ANGIOGRAM;  Surgeon: Adrian Prows, MD;  Location: [facility] CATH [facility];  Service: Cardiovascular;  Laterality: N/A;     Family History  Problem Relation Age of Onset  . Breast cancer Mother   . Aneurysm Father     Likely thoracic aneurysm per patient  . Melanoma Brother   . Heart disease Sister   . Diabetes type II Sister      Social History: Social History   Social History  . Marital Status: Married    Spouse Name: N/A  . Number of Children: N/A  . Years of Education: N/A   Occupational History  . Not on file.   Social History Main Topics  . Smoking status: Never Smoker   . Smokeless tobacco: Never Used  . Alcohol Use: No  .  Drug Use: No  . Sexual Activity: Not on file   Other Topics Concern  . Not on file   Social History Narrative     Prescriptions prior to admission  Medication Sig Dispense Refill Last Dose  . acetaminophen (TYLENOL) 500 MG tablet Take 500 mg by mouth every 6 (six) hours as needed for moderate pain or headache.    Past Week at Unknown time  . atorvastatin (LIPITOR) 40 MG tablet Take 1 tablet (40 mg total) by mouth daily. 30 tablet 0 04/02/2016 at Unknown time  . Calcium Carb-Cholecalciferol (CALCIUM-VITAMIN D3) 600-500 MG-UNIT  CAPS Take 1 capsule by mouth daily.   04/02/2016 at Unknown time  . clobetasol (TEMOVATE) 0.05 % external solution Apply 1 application topically 2 (two) times daily.   04/01/2016 at Unknown time  . clobetasol cream (TEMOVATE) 5.68 % Apply 1 application topically 2 (two) times daily.   04/01/2016 at Unknown time  . colesevelam (WELCHOL) 625 MG tablet Take 625 mg by mouth daily.    04/02/2016 at Unknown time  . diltiazem (CARDIZEM CD) 180 MG 24 hr capsule Take 1 capsule (180 mg total) by mouth daily. 30 capsule 0 04/02/2016 at Unknown time  . hydrOXYzine (VISTARIL) 25 MG capsule Take 25 mg by mouth at bedtime.   04/01/2016 at Unknown time  . isosorbide mononitrate (IMDUR) 60 MG 24 hr tablet Take 1 tablet (60 mg total) by mouth daily. 30 tablet 0 04/02/2016 at Unknown time  . levothyroxine (SYNTHROID, LEVOTHROID) 150 MCG tablet Take 150 mcg by mouth daily.   04/02/2016 at Unknown time  . Mag Oxide-Vit D3-Turmeric (MAGNESIUM-VITAMIN D3-TURMERIC PO) Take 1 tablet by mouth daily.   04/02/2016 at Unknown time  . meclizine (ANTIVERT) 25 MG tablet Take 25 mg by mouth daily as needed for dizziness.    Past Month at Unknown time  . Multiple Vitamins-Minerals (WOMENS BONE HEALTH PO) Take 1 tablet by mouth daily.   04/02/2016 at Unknown time  . Polyethyl Glycol-Propyl Glycol (SYSTANE) 0.4-0.3 % SOLN Apply 2 drops to eye 2 (two) times daily as needed (dry eyes).   04/02/2016 at Unknown time  . polyvinyl alcohol (LIQUIFILM TEARS) 1.4 % ophthalmic solution Place 2 drops into both eyes as needed for dry eyes.    Past Month at Unknown time  . Rivaroxaban (XARELTO) 15 MG TABS tablet Take 15 mg by mouth daily with supper.   04/01/2016 at Unknown time  . sotalol (BETAPACE) 80 MG tablet Take 80 mg by mouth 2 (two) times daily.   04/02/2016 at 0800  . traMADol (ULTRAM) 50 MG tablet Take 1 tablet (50 mg total) by mouth every 6 (six) hours as needed. 15 tablet 0   . triamcinolone (KENALOG) 0.025 % ointment Apply 1 application topically 2 (two) times  daily. Do not apply to face 80 g 1    ROS: General: no fevers/chills/night sweats Eyes: no blurry vision, diplopia, or amaurosis ENT: no sore throat or hearing loss Resp: no cough, wheezing, or hemoptysis. Has chronic shortness of breath and dyspnea on exertion which has gradually been worsening, uses nocturnal oxygen. CV: no chest pain, edema, or palpitations GI: No dark stools or bloody stools. Complains of diarrhea. No abdominal pain, nausea, vomiting, or constipation GU: no dysuria, frequency, or hematuria Neuro: Reports one syncopal episode, preceded by lightheadedness, shortness of breath; no headache, numbness, tingling, or weakness of extremities Musculoskeletal: no joint pain or swelling Heme: no bleeding, DVT, or easy bruising Endo: no polydipsia or polyuria   Physical  Exam: Blood pressure 118/60, pulse 70, temperature 97.5 F (36.4 C), temperature source Oral, resp. rate 20, height '5\' 3"'$  (1.6 m), weight 53.751 kg (118 lb 8 oz), SpO2 99 %.   General appearance: alert, cooperative, appears stated age and no distress Lungs: clear to auscultation bilaterally Heart: Distant heart sounds. S1 is variable, S2 is normal, 2/6 systolic ejection murmur at the apex. No gallop. No pericardial rub. Abdomen: soft, non-tender; bowel sounds normal; no masses,  no organomegaly Extremities: extremities normal, atraumatic, no cyanosis or edema Pulses: 2+ and symmetric Neurologic: Grossly normal  Labs:   Lab Results  Component Value Date   WBC 7.0 04/03/2016   HGB 9.6* 04/03/2016   HCT 29.4* 04/03/2016   MCV 85.5 04/03/2016   PLT 107* 04/03/2016    Recent Labs Lab 04/03/16 0205  NA 139  K 3.5  CL 106  CO2 20*  BUN 14  CREATININE 1.08*  CALCIUM 8.3*  PROT 5.2*  BILITOT 0.7  ALKPHOS 52  ALT 13*  AST 14*  GLUCOSE 173*    BNP (last 3 results)  Recent Labs  04/17/15 1459 04/03/16 0038  BNP 302.0* 204.8*    HEMOGLOBIN A1C Lab Results  Component Value Date   HGBA1C  9.4* 04/18/2015   MPG 223 04/18/2015    Cardiac Panel (last 3 results) No results for input(s): CKTOTAL, CKMB, TROPONINI, RELINDX in the last 8760 hours.  Lab Results  Component Value Date   CKTOTAL 66 03/14/2012   CKMB 2.0 03/14/2012   TROPONINI <0.30 03/14/2012    EKG 04/02/2016: afib with V rate of 87bom, normal axis, diffuse nonspecific ST and T wave abnormality, low voltage complexes, no change from outpatient EKG on 02/15/2016  Echo 04/03/2016:  - Left ventricle: The cavity size was normal. Systolic function was normal. The estimated ejection fraction was in the range of 60-65%. The study is not technically sufficient to allow evaluation of LV diastolic function. - Aortic valve: Mildly calcified annulus. Trileaflet; mildly calcified leaflets. There was mild stenosis. There was mild regurgitation. Valve area (VTI): 0.74 cm^2. Valve area (Vmax):0.63 cm^2. Valve area (Vmean): 0.63 cm^2. - Mitral valve: Calcified annulus. There was mild regurgitation. - Left atrium: The atrium was moderately dilated. - Right ventricle: The cavity size was mildly dilated. Systolic function was mildly reduced. - Right atrium: The atrium was mildly dilated. - Atrial septum: No defect or patent foramen ovale was identified. - Tricuspid valve: There was moderate regurgitation. - Pulmonary arteries: PA peak pressure: 47 mm Hg (S).   Radiology: Dg Chest 2 View  04/02/2016  CLINICAL DATA:  Shortness of breath and syncope. EXAM: CHEST - 2 VIEW COMPARISON:  12/19/2015 FINDINGS: The heart size and mediastinal contours are within normal limits. Stable probable chronic lung disease with suggestion of mild bilateral pulmonary hyperinflation. There is no evidence of pulmonary edema, consolidation, pneumothorax, nodule or pleural fluid. The thoracic spine shows osteopenia and spondylosis without visible fracture. IMPRESSION: No active disease.  Chronic lung disease.  No acute findings. Electronically Signed   By: Aletta Edouard M.D.   On: 04/02/2016 18:17   Ct Head Wo Contrast  04/02/2016  CLINICAL DATA:  Recent fall with facial injury EXAM: CT HEAD WITHOUT CONTRAST CT MAXILLOFACIAL WITHOUT CONTRAST TECHNIQUE: Multidetector CT imaging of the head and maxillofacial structures were performed using the standard protocol without intravenous contrast. Multiplanar CT image reconstructions of the maxillofacial structures were also generated. COMPARISON:  10/02/2012 FINDINGS: CT HEAD FINDINGS The bony calvarium is intact. Mild atrophic  changes are noted commenced with the patient's given age. No findings to suggest acute hemorrhage, acute infarction or space-occupying mass lesion are noted. CT MAXILLOFACIAL FINDINGS Bony structures of the face show no acute fracture. Prior surgical changes are noted posterior to the right zygomatic arch. The orbits and their contents are within normal limits. The paranasal sinuses are unremarkable. Soft tissue swelling is noted below the nose centrally and eccentric to the right consistent with the recent injury. Degenerative changes of the temporomandibular joints are seen. No blowout fracture is identified. The visualized cervical spine shows degenerative change. No acute for abnormality is noted. IMPRESSION: CT of the head:  No acute intracranial abnormality noted. CT of the maxillofacial bones: No acute bony abnormality is noted. Soft tissue swelling over the right upper lip is seen consistent with the recent injury. Electronically Signed   By: Inez Catalina M.D.   On: 04/02/2016 19:11   Ct Angio Chest Pe W/cm &/or Wo Cm  04/03/2016  CLINICAL DATA:  Syncope and shortness of breath.  Elevated D-dimer. EXAM: CT ANGIOGRAPHY CHEST WITH CONTRAST TECHNIQUE: Multidetector CT imaging of the chest was performed using the standard protocol during bolus administration of intravenous contrast. Multiplanar CT image reconstructions and MIPs were obtained to evaluate the vascular anatomy. CONTRAST:  100 mL Isovue  370 IV COMPARISON:  Chest radiograph yesterday. Chest CT 04/18/2015, 03/13/2012 FINDINGS: Mediastinum/Lymph Nodes: No pulmonary emboli. Tortuous atherosclerotic aorta without aneurysm. Cannot assess for dissection given phase of contrast. No masses or pathologically enlarged lymph nodes identified. No pericardial effusion. Coronary artery calcifications. Lungs/Pleura: Mild emphysema. No consolidation. Multiple pulmonary nodules. Right middle lobe pulmonary nodule measures 1.4 x 1.1 cm image 62 series 6 unchanged adjacent 9 mm right middle lobe nodule image 58. Dominant right lower lobe nodule measures 1.7 x 1.6 cm image 62. Additional smaller right lower lobe pulmonary nodules are stable. Multiple small left lower lobe pulmonary nodules are unchanged. No new nodule. No pleural effusion. Upper abdomen: No acute findings. Musculoskeletal: No chest wall mass or suspicious bone lesions identified. Prominent Schmorl's node lower thoracic vertebra. Review of the MIP images confirms the above findings. IMPRESSION: 1. No pulmonary embolus. 2. No acute process. 3. Multiple bilateral pulmonary nodules, unchanged dating back to 2013 and considered benign. 4. Atherosclerosis and coronary artery calcifications. Electronically Signed   By: Jeb Levering M.D.   On: 04/03/2016 02:13   Ct Maxillofacial Wo Cm  04/02/2016  CLINICAL DATA:  Recent fall with facial injury EXAM: CT HEAD WITHOUT CONTRAST CT MAXILLOFACIAL WITHOUT CONTRAST TECHNIQUE: Multidetector CT imaging of the head and maxillofacial structures were performed using the standard protocol without intravenous contrast. Multiplanar CT image reconstructions of the maxillofacial structures were also generated. COMPARISON:  10/02/2012 FINDINGS: CT HEAD FINDINGS The bony calvarium is intact. Mild atrophic changes are noted commenced with the patient's given age. No findings to suggest acute hemorrhage, acute infarction or space-occupying mass lesion are noted. CT  MAXILLOFACIAL FINDINGS Bony structures of the face show no acute fracture. Prior surgical changes are noted posterior to the right zygomatic arch. The orbits and their contents are within normal limits. The paranasal sinuses are unremarkable. Soft tissue swelling is noted below the nose centrally and eccentric to the right consistent with the recent injury. Degenerative changes of the temporomandibular joints are seen. No blowout fracture is identified. The visualized cervical spine shows degenerative change. No acute for abnormality is noted. IMPRESSION: CT of the head:  No acute intracranial abnormality noted. CT of the maxillofacial bones:  No acute bony abnormality is noted. Soft tissue swelling over the right upper lip is seen consistent with the recent injury. Electronically Signed   By: Inez Catalina M.D.   On: 04/02/2016 19:11    Scheduled Meds: . atorvastatin  40 mg Oral Daily  . colesevelam  625 mg Oral Q breakfast  . diltiazem  180 mg Oral Daily  . hydrOXYzine  25 mg Oral QHS  . insulin aspart  0-9 Units Subcutaneous TID WC  . isosorbide mononitrate  30 mg Oral Daily  . levothyroxine  150 mcg Oral QAC breakfast  . rivaroxaban  15 mg Oral Q supper  . sodium chloride flush  3 mL Intravenous Q12H  . sotalol  80 mg Oral BID   Continuous Infusions:  PRN Meds:.acetaminophen **OR** acetaminophen, alum & mag hydroxide-simeth, meclizine, ondansetron **OR** ondansetron (ZOFRAN) IV, senna-docusate  ASSESSMENT AND PLAN:  1. Syncope 2. Chronic a.fib (CHA2DS2-VASc Score is 6 with yearly risk of stroke of 9.8%.  HAS-Bled score is 2 and estimated major bleeding in one year is 1.88-3.2%); on Xarelto for anticoagulation 3. Non critical CAD by cath on 04/20/2015 (D1 and D2 have high-grade stenosis, very small and unchanged from 2007.  Mid LAD 50% stenosis unchanged from 2007) 4. Essential Hypertension 5. HLD 6. Uncontrolled Type II Diabetes Mellitus 7. Bilateral Fibromuscular dysplasia of renal  arteries (incidental finding during cardiac cath Sept 2007) 8. Carcinoid bronchial adenoma 9. Anemia and chronic thrombocytopenia, stable.   Recommendation: Patient symptoms prior to the episode of syncope is more consistent with neuro coverage and syncope. Patient also having frequent episodes of loose stools over the past several weeks and dehydration although orthostatic negative may also lead to syncope. I do not suspect malignant arrhythmic episode. No heart block on telemetry. Patient has difficulty in controlling atrial fibrillation rate, amiodarone felt not to be appropriate due to her lung condition hence on chronic sotalol and rate is well controlled.  She is also noticed gradual worsening in overall general health including worsening dyspnea. Fortunately her lung nodules by CT scan has remained stable. We could certainly perform an event monitor for 30 days at discharge.   Adrian Prows, MD 04/03/2016, 6:06 PM [facility] [place] Pager: [phone] Office: [phone] If no answer Cell [phone]

## 2016-04-03 NOTE — Progress Notes (Signed)
  Echocardiogram 2D Echocardiogram has been performed.  Lyza Houseworth 04/03/2016, 12:09 PM

## 2016-04-03 NOTE — Progress Notes (Signed)
T [facility] Progress Note                                            Patient Demographics:    [name], is a 82 y.o. female, DOB - [birth year], ZOX:[id number]  Admit date - 04/02/2016   Admitting Physician Lavina Hamman, MD  Outpatient Primary MD for the patient is Dwan Bolt, MD  LOS -   Outpatient Specialists:[facility]  Chief Complaint  Patient presents with  . Loss of Consciousness        Subjective:   Feels weak, chronic dyspnea, no complaints   Assessment  & Plan :  1. Syncope -etiology unclear, vasovagal vs cardiogenic -QTC normal -Orthostatics negative -tele with Afib, PVCs, rare missed beat -FU ECHO -Requested Cards eval, d/w Dr.Ganji  2. History of Stroke [facility]) -continue xarelto  3. P. Atrial fibrillation (HCC) - CHA2DS2-VASc Score 7 -Continue cardizem, sotalol. -continue xarelto  4. 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 Nathanial Millman note on 02/07/13, stable, on surveillance, CTA chest with stable pulm disease  5. Hypothyroid Continue Synthroid.  5. DM (diabetes mellitus), type 2, uncontrolled, with renal complications (HCC) -SSI, not on meds, Fu Hba1c  Code Status : Full Code Family Communication  : none at bedside Disposition Plan  : Home   Barriers For Discharge :   Consults  :  C[facility] Dr.Ganji  DVT Prophylaxis  :  Xarelto  Lab Results  Component Value Date   PLT 107* 04/03/2016    Anti-infectives    None        Objective:   Filed Vitals:   04/02/16 2200 04/02/16 2311 04/03/16 0615 04/03/16 1043  BP: 114/60 136/69  120/59  Pulse: 93 82  70  Temp:  97.7 F (36.5 C) 97.5 F (36.4 C)   TempSrc:  Oral Oral   Resp: '23 20 20   '$ Height:  '5\' 3"'$  (1.6 m)    Weight:   53.751 kg (118 lb 8 oz)    SpO2: 95% 98% 99%     Wt Readings from Last 3 Encounters:  04/02/16 53.751 kg (118 lb 8 oz)  04/20/15 65.862 kg (145 lb 3.2 oz)  05/10/13 55.792 kg (123 lb)     Intake/Output Summary (Last 24 hours) at 04/03/16 1217 Last data filed at 04/03/16 0915  Gross per 24 hour  Intake    260 ml  Output      0 ml  Net    260 ml     Physical Exam  Awake Alert, Oriented X 3, No new F.N deficits, Normal affect [place].AT,PERRAL Supple Neck,No JVD, No cervical lymphadenopathy appriciated.  Symmetrical Chest wall movement, Good air movement bilaterally, CTAB RRR,No Gallops,Rubs or new Murmurs, No Parasternal Heave +ve B.Sounds, Abd Soft, No tenderness, No organomegaly appriciated, No rebound - guarding or rigidity. No Cyanosis, Clubbing or edema, No new Rash or bruise     Data Review:    CBC  Recent Labs Lab 04/02/16 2007 04/02/16 2009 04/03/16 0205  WBC  --  7.2 7.0  HGB 10.9* 10.3* 9.6*  HCT 32.0* 31.1* 29.4*  PLT  --  127* 107*  MCV  --  84.7 85.5  MCH  --  28.1 27.9  MCHC  --  33.1 32.7  RDW  --  13.1 13.3  LYMPHSABS  --  1.4  --   MONOABS  --  0.6  --   EOSABS  --  0.1  --   BASOSABS  --  0.0  --     Chemistries   Recent Labs Lab 04/02/16 2007 04/02/16 2009 04/03/16 0205  NA 141 139 139  K 4.0 4.0 3.5  CL 104 106 106  CO2  --  21* 20*  GLUCOSE 210* 213* 173*  BUN '16 14 14  '$ CREATININE 0.90 0.98 1.08*  CALCIUM  --  8.5* 8.3*  MG  --   --  1.0*  AST  --  15 14*  ALT  --  14 13*  ALKPHOS  --  59 52  BILITOT  --  0.6 0.7   ------------------------------------------------------------------------------------------------------------------ No results for input(s): CHOL, HDL, LDLCALC, TRIG, CHOLHDL, LDLDIRECT in the last 72 hours.  Lab Results  Component Value Date   HGBA1C 9.4* 04/18/2015   ------------------------------------------------------------------------------------------------------------------ No results for input(s):  TSH, T4TOTAL, T3FREE, THYROIDAB in the last 72 hours.  Invalid input(s): FREET3 ------------------------------------------------------------------------------------------------------------------ No results for input(s): VITAMINB12, FOLATE, FERRITIN, TIBC, IRON, RETICCTPCT in the last 72 hours.  Coagulation profile  Recent Labs Lab 04/02/16 2009 04/03/16 0205  INR 1.32 2.92*     Recent Labs  04/02/16 2132  DDIMER 3.31*    Cardiac Enzymes No results for input(s): CKMB, TROPONINI, MYOGLOBIN in the last 168 hours.  Invalid input(s): CK ------------------------------------------------------------------------------------------------------------------    Component Value Date/Time   BNP 204.8* 04/03/2016 0038    Inpatient Medications  Scheduled Meds: . atorvastatin  40 mg Oral Daily  . colesevelam  625 mg Oral Q breakfast  . diltiazem  180 mg Oral Daily  . hydrOXYzine  25 mg Oral QHS  . insulin aspart  0-9 Units Subcutaneous TID WC  . isosorbide mononitrate  30 mg Oral Daily  . levothyroxine  150 mcg Oral QAC breakfast  . rivaroxaban  15 mg Oral Q supper  . sodium chloride flush  3 mL Intravenous Q12H  . sotalol  80 mg Oral BID   Continuous Infusions:  PRN Meds:.acetaminophen **OR** acetaminophen, alum & mag hydroxide-simeth, meclizine, ondansetron **OR** ondansetron (ZOFRAN) IV, senna-docusate  Micro Results No results found for this or any previous visit (from the past 240 hour(s)).  Radiology Reports Dg Chest 2 View  04/02/2016  CLINICAL DATA:  Shortness of breath and syncope. EXAM: CHEST - 2 VIEW COMPARISON:  12/19/2015 FINDINGS: The heart size and mediastinal contours are within normal limits. Stable probable chronic lung disease with suggestion of mild bilateral pulmonary hyperinflation. There is no evidence of pulmonary edema, consolidation, pneumothorax, nodule or pleural fluid. The thoracic spine shows osteopenia and spondylosis without visible fracture.  IMPRESSION: No active disease.  Chronic lung disease.  No acute findings. Electronically Signed   By: Aletta Edouard M.D.   On: 04/02/2016 18:17   Dg Wrist Complete Left  03/15/2016  CLINICAL DATA:  New lifting injury of the wrist with a popping sensation 6 days ago. Continued lateral wrist pain EXAM: LEFT WRIST - COMPLETE 3+ VIEW COMPARISON:  02/14/2011 FINDINGS: Deformity from prior fifth metacarpal shaft fracture. New linear calcification in the expected location of the TFCC disc favoring chondrocalcinosis. Mild spurring of the distal pole of the scaphoid. Chondral thinning in the carpus leads to mild carpal crowding. There is some ridging of the midportion and distal pole of the scaphoid. Upper normal with of the scapholunate distance. IMPRESSION: 1. I do not see an acute  fracture. 2. Spurring in the scaphoid. 3. There is new chondrocalcinosis of the TFCC, raising the possibility of CPPD arthropathy. 4. If pain persists despite conservative therapy, MRI may be warranted for further characterization. Electronically Signed   By: Van Clines M.D.   On: 03/15/2016 08:19   Ct Head Wo Contrast  04/02/2016  CLINICAL DATA:  Recent fall with facial injury EXAM: CT HEAD WITHOUT CONTRAST CT MAXILLOFACIAL WITHOUT CONTRAST TECHNIQUE: Multidetector CT imaging of the head and maxillofacial structures were performed using the standard protocol without intravenous contrast. Multiplanar CT image reconstructions of the maxillofacial structures were also generated. COMPARISON:  10/02/2012 FINDINGS: CT HEAD FINDINGS The bony calvarium is intact. Mild atrophic changes are noted commenced with the patient's given age. No findings to suggest acute hemorrhage, acute infarction or space-occupying mass lesion are noted. CT MAXILLOFACIAL FINDINGS Bony structures of the face show no acute fracture. Prior surgical changes are noted posterior to the right zygomatic arch. The orbits and their contents are within normal limits. The  paranasal sinuses are unremarkable. Soft tissue swelling is noted below the nose centrally and eccentric to the right consistent with the recent injury. Degenerative changes of the temporomandibular joints are seen. No blowout fracture is identified. The visualized cervical spine shows degenerative change. No acute for abnormality is noted. IMPRESSION: CT of the head:  No acute intracranial abnormality noted. CT of the maxillofacial bones: No acute bony abnormality is noted. Soft tissue swelling over the right upper lip is seen consistent with the recent injury. Electronically Signed   By: Inez Catalina M.D.   On: 04/02/2016 19:11   Ct Angio Chest Pe W/cm &/or Wo Cm  04/03/2016  CLINICAL DATA:  Syncope and shortness of breath.  Elevated D-dimer. EXAM: CT ANGIOGRAPHY CHEST WITH CONTRAST TECHNIQUE: Multidetector CT imaging of the chest was performed using the standard protocol during bolus administration of intravenous contrast. Multiplanar CT image reconstructions and MIPs were obtained to evaluate the vascular anatomy. CONTRAST:  100 mL Isovue 370 IV COMPARISON:  Chest radiograph yesterday. Chest CT 04/18/2015, 03/13/2012 FINDINGS: Mediastinum/Lymph Nodes: No pulmonary emboli. Tortuous atherosclerotic aorta without aneurysm. Cannot assess for dissection given phase of contrast. No masses or pathologically enlarged lymph nodes identified. No pericardial effusion. Coronary artery calcifications. Lungs/Pleura: Mild emphysema. No consolidation. Multiple pulmonary nodules. Right middle lobe pulmonary nodule measures 1.4 x 1.1 cm image 62 series 6 unchanged adjacent 9 mm right middle lobe nodule image 58. Dominant right lower lobe nodule measures 1.7 x 1.6 cm image 62. Additional smaller right lower lobe pulmonary nodules are stable. Multiple small left lower lobe pulmonary nodules are unchanged. No new nodule. No pleural effusion. Upper abdomen: No acute findings. Musculoskeletal: No chest wall mass or suspicious bone  lesions identified. Prominent Schmorl's node lower thoracic vertebra. Review of the MIP images confirms the above findings. IMPRESSION: 1. No pulmonary embolus. 2. No acute process. 3. Multiple bilateral pulmonary nodules, unchanged dating back to 2013 and considered benign. 4. Atherosclerosis and coronary artery calcifications. Electronically Signed   By: Jeb Levering M.D.   On: 04/03/2016 02:13   Dg Hand Complete Left  03/15/2016  CLINICAL DATA:  "Pop" in left wrist.  Pain. EXAM: LEFT HAND - COMPLETE 3+ VIEW COMPARISON:  None. FINDINGS: Degenerative changes in the fingers. No wrist abnormalities identified. IMPRESSION: Degenerative changes in the hand. Electronically Signed   By: Dorise Bullion III M.D   On: 03/15/2016 09:30   Ct Maxillofacial Wo Cm  04/02/2016  CLINICAL DATA:  Recent fall  with facial injury EXAM: CT HEAD WITHOUT CONTRAST CT MAXILLOFACIAL WITHOUT CONTRAST TECHNIQUE: Multidetector CT imaging of the head and maxillofacial structures were performed using the standard protocol without intravenous contrast. Multiplanar CT image reconstructions of the maxillofacial structures were also generated. COMPARISON:  10/02/2012 FINDINGS: CT HEAD FINDINGS The bony calvarium is intact. Mild atrophic changes are noted commenced with the patient's given age. No findings to suggest acute hemorrhage, acute infarction or space-occupying mass lesion are noted. CT MAXILLOFACIAL FINDINGS Bony structures of the face show no acute fracture. Prior surgical changes are noted posterior to the right zygomatic arch. The orbits and their contents are within normal limits. The paranasal sinuses are unremarkable. Soft tissue swelling is noted below the nose centrally and eccentric to the right consistent with the recent injury. Degenerative changes of the temporomandibular joints are seen. No blowout fracture is identified. The visualized cervical spine shows degenerative change. No acute for abnormality is noted.  IMPRESSION: CT of the head:  No acute intracranial abnormality noted. CT of the maxillofacial bones: No acute bony abnormality is noted. Soft tissue swelling over the right upper lip is seen consistent with the recent injury. Electronically Signed   By: Inez Catalina M.D.   On: 04/02/2016 19:11    Time Spent in minutes   14mn   Jeniah Kishi M.D on 04/03/2016 at 12:17 PM  Between 7am to 7pm - Pager - [phone]  After 7pm go to [email or web address] - password [email or web address] -  Office  [phone]

## 2016-04-03 NOTE — Care Management Note (Signed)
Case Management Note Marvetta Gibbons RN, BSN Unit 2W-Case Manager [phone]  Patient Details  Name: [name] MRN: [id number] Date of Birth: November 30, 1934  Subjective/Objective:   Pt admitted with syncope                 Action/Plan: PTA pt lived at home with spouse, has a walker (but it is very old per pt)- PT recommending HHPT and RW- pt would like a new RW- will need HH and DME orders prior to discharge- referral received for medication needs/pt not being able to afford meds- in to speak with pt at bedside- per conversation with pt she states that some of her copays are over $100, confirmed that she has [facility]- pt reports that some of her medications are costly and she has difficulty affording them- as pt has medicare drug coverage she does not qualify for medication assistance programs or assistance with MATCH- discussed with pt that she may need to discuss with her PCP which medications are costly for her and see if there are alternatives that may can be prescribed. Pt also reports that she has home 02 with [employer].  CM to continue to follow for d/c needs  Expected Discharge Date:                  Expected Discharge Plan:  [place]  In-House Referral:     Discharge planning Services  CM Consult, Medication Assistance  Post Acute Care Choice:  Home Health Choice offered to:  Patient  DME Arranged:    DME Agency:     HH Arranged:    [place] Agency:     Status of Service:  In process, will continue to follow  Medicare Important Message Given:    Date Medicare IM Given:    Medicare IM give by:    Date Additional Medicare IM Given:    Additional Medicare Important Message give by:     If discussed at [place] of Stay Meetings, dates discussed:    Additional Comments:  Dawayne Patricia, RN 04/03/2016, 11:39 AM

## 2016-04-04 DIAGNOSIS — R55 Syncope and collapse: Secondary | ICD-10-CM | POA: Diagnosis not present

## 2016-04-04 DIAGNOSIS — R0602 Shortness of breath: Secondary | ICD-10-CM | POA: Diagnosis not present

## 2016-04-04 DIAGNOSIS — E1129 Type 2 diabetes mellitus with other diabetic kidney complication: Secondary | ICD-10-CM | POA: Diagnosis not present

## 2016-04-04 DIAGNOSIS — E1165 Type 2 diabetes mellitus with hyperglycemia: Secondary | ICD-10-CM

## 2016-04-04 DIAGNOSIS — E039 Hypothyroidism, unspecified: Secondary | ICD-10-CM | POA: Diagnosis not present

## 2016-04-04 LAB — HEMOGLOBIN A1C
HEMOGLOBIN A1C: 7.9 % — AB (ref 4.8–5.6)
Mean Plasma Glucose: 180 mg/dL

## 2016-04-04 LAB — GLUCOSE, CAPILLARY
Glucose-Capillary: 183 mg/dL — ABNORMAL HIGH (ref 65–99)
Glucose-Capillary: 189 mg/dL — ABNORMAL HIGH (ref 65–99)

## 2016-04-04 NOTE — Progress Notes (Signed)
Physical Therapy Treatment Patient Details Name: [name] MRN: [id number] DOB: 1934-05-10 Today's Date: 04/04/2016    History of Present Illness [name] is a 82 y.o. female with Past medical history of type 2 diabetes mellitus, coronary artery disease, hypothyroidism, essential hypertension, A. fib on anticoagulation, CAD, stage IV low grade neuroendocrine tumor.  She was admitted due to syncopal episode at home and now with upper lip hematoma.    PT Comments    Pt performed increased gait distance.  With increased distance pt demonstrates fatigue and R knee buckling.  Pt required mod assist to correct LOB and required mod assist to negotiate stairs x9 for safe simulation of entry into home.  Pt lives with elderly husband who has difficulty providing physical assistance.  Will inform supervising PT of pt's functional status and possible need for further rehab in post acute setting.    Follow Up Recommendations  Supervision/Assistance - 24 hour;SNF     Equipment Recommendations  Rolling walker with 5" wheels    Recommendations for Other Services       Precautions / Restrictions Restrictions Weight Bearing Restrictions: No    Mobility  Bed Mobility Overal bed mobility: Needs Assistance Bed Mobility: Supine to Sit     Supine to sit: HOB elevated;Supervision     General bed mobility comments: slow to come up to EOB with increased effort needed; Pt use rail and self assist R LE to edge of bed.    Transfers Overall transfer level: Needs assistance Equipment used: Rolling walker (2 wheeled) Transfers: Sit to/from Stand Sit to Stand: Min guard;Mod assist         General transfer comment: up from EOB assist for balance/safety, pt required seated rest break in stair well after fatigue and require mod assist to transfer from low seated surface.    Ambulation/Gait Ambulation/Gait assistance: Min guard;Mod assist (required min guard through out with excpetion fo  knee buckling x1 requiring mod assist to correct LOB.  ) Ambulation Distance (Feet): 200 Feet (+120f, 1045f  ) Assistive device: Rolling walker (2 wheeled) Gait Pattern/deviations: Step-through pattern;Trunk flexed;Shuffle;Decreased stride length;Antalgic Gait velocity: slow Gait velocity interpretation: Below normal speed for age/gender General Gait Details: R knee buckling.  Cues for upright posture and to increase B stride length.  Pt fatigues with increased distance and required increased assist when fatigue occurs.     Stairs Stairs: Yes Stairs assistance: Mod assist Stair Management: Two rails Number of Stairs: 9 General stair comments: Required seated rest break after ascending x9 stairs.  Pt descended with increased assist due to fatigue in B LEs.  pt reports having 7 stairs to ascend at home and requiring assist from husband who has almost fallen assisting patient.    Wheelchair Mobility    Modified Rankin (Stroke Patients Only)       Balance Overall balance assessment: Needs assistance   Sitting balance-Leahy Scale: Good       Standing balance-Leahy Scale: Poor                      Cognition Arousal/Alertness: Awake/alert Behavior During Therapy: WFL for tasks assessed/performed Overall Cognitive Status: Within Functional Limits for tasks assessed                      Exercises      General Comments        Pertinent Vitals/Pain Pain Assessment: 0-10 Pain Score: 8  Pain Location: R knee with movement,  noticeable edema.  MD and RN informed.  Pain Descriptors / Indicators: Aching;Sore Pain Intervention(s): Monitored during session;Repositioned    Home Living                      Prior Function            PT Goals (current goals can now be found in the care plan section) Acute Rehab PT Goals Patient Stated Goal: To get stronger Potential to Achieve Goals: Good Progress towards PT goals: Progressing toward goals     Frequency       PT Plan Discharge plan needs to be updated    Co-evaluation             End of Session Equipment Utilized During Treatment: Gait belt Activity Tolerance: Patient limited by pain Patient left: in chair;with chair alarm set;with family/visitor present;with call bell/phone within reach     Time: 1036-1106 PT Time Calculation (min) (ACUTE ONLY): 30 min  Charges:  $Gait Training: 8-22 mins $Therapeutic Activity: 8-22 mins                    G Codes:      Cristela Blue Apr 16, 2016, 11:22 AM  Governor [place], PTA pager [phone]

## 2016-04-04 NOTE — Discharge Summary (Signed)
Physician Discharge Summary  [name] PFX:[id number] DOB: [birth year] DOA: 04/02/2016  PCP: Dwan Bolt, MD  Admit date: 04/02/2016 Discharge date: 04/04/2016  Time spent: 45 minutes  Recommendations for Outpatient Follow-up:  1. 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 female with Past medical history of type 2 diabetes mellitus, coronary artery disease, hypothyroidism, essential hypertension, A. fib on anticoagulation. The patient presented with an episode of unresponsiveness. Patient mentions that while she was in her kitchen to make her husband decide which she felt lightheaded. She initially sat down and felt better but then started having significant shortness of breath when going to the kitchen and then talk to sit down again. The next and that she remembers is her husband was trying to wake her up from the floor. Patient mentioned that she did have similar episode earlier in the day with increased shortness of breath. She had been having some fatigue and tiredness as well.  Hospital Course:   1. Syncope -etiology unclear, vasovagal suspected -CTA no PE, stable lung nodules -QTC normal -Orthostatics negative -tele with Afib, PVCs, rare missed beat -2D ECHO with  normal EF and wall motion -seen by dr.Ganji, cards in consultation, recommended Event monitor at FU  2. History of Stroke [facility]) -continue xarelto  3. P. Atrial fibrillation (HCC) - CHA2DS2-VASc Score 7 -Continue cardizem, sotalol. -continue xarelto  4. 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 Nathanial Millman note on 02/07/13, stable, on surveillance, CTA chest with stable pulm disease  5. Hypothyroid Continue Synthroid.  5. DM (diabetes mellitus), type 2, uncontrolled, with renal complications (HCC) -SSI, not on meds  Procedures:  ECHO  Consultations:  Dr.Ganji  Discharge Exam: Filed Vitals:   04/04/16 0443 04/04/16 1015  BP: 117/59 110/58  Pulse: 78 79  Temp: 98 F (36.7 C)   Resp: 18     General: AAOx3 Cardiovascular: S1s2/RRR Respiratory: CTAb  Discharge Instructions   Discharge Instructions    Diet - low sodium heart healthy    Complete by:  As directed      Increase activity slowly    Complete by:  As directed           Current Discharge Medication List    CONTINUE these medications which have NOT CHANGED   Details  acetaminophen (TYLENOL) 500 MG tablet Take 500 mg by mouth every 6 (six) hours as needed for moderate pain or headache.     atorvastatin (LIPITOR) 40 MG tablet Take 1 tablet (40 mg total) by mouth daily. Qty: 30 tablet, Refills: 0    Calcium Carb-Cholecalciferol (CALCIUM-VITAMIN D3) 600-500 MG-UNIT CAPS Take 1 capsule by mouth daily.    clobetasol (  TEMOVATE) 0.05 % external solution Apply 1 application topically 2 (two) times daily.    clobetasol cream (TEMOVATE) 3.81 % Apply 1 application topically 2 (two) times daily.    colesevelam (WELCHOL) 625 MG tablet Take 625 mg by mouth daily.     diltiazem (CARDIZEM CD) 180 MG 24 hr capsule Take 1 capsule (180 mg total) by mouth daily. Qty: 30 capsule,  Refills: 0    hydrOXYzine (VISTARIL) 25 MG capsule Take 25 mg by mouth at bedtime.    isosorbide mononitrate (IMDUR) 60 MG 24 hr tablet Take 1 tablet (60 mg total) by mouth daily. Qty: 30 tablet, Refills: 0    levothyroxine (SYNTHROID, LEVOTHROID) 150 MCG tablet Take 150 mcg by mouth daily.    Mag Oxide-Vit D3-Turmeric (MAGNESIUM-VITAMIN D3-TURMERIC PO) Take 1 tablet by mouth daily.    meclizine (ANTIVERT) 25 MG tablet Take 25 mg by mouth daily as needed for dizziness.     Multiple Vitamins-Minerals (WOMENS BONE HEALTH PO) Take 1 tablet by mouth daily.    Polyethyl Glycol-Propyl Glycol (SYSTANE) 0.4-0.3 % SOLN Apply 2 drops to eye 2 (two) times daily as needed (dry eyes).    polyvinyl alcohol (LIQUIFILM TEARS) 1.4 % ophthalmic solution Place 2 drops into both eyes as needed for dry eyes.     Rivaroxaban (XARELTO) 15 MG TABS tablet Take 15 mg by mouth daily with supper.    sotalol (BETAPACE) 80 MG tablet Take 80 mg by mouth 2 (two) times daily.    traMADol (ULTRAM) 50 MG tablet Take 1 tablet (50 mg total) by mouth every 6 (six) hours as needed. Qty: 15 tablet, Refills: 0    triamcinolone (KENALOG) 0.025 % ointment Apply 1 application topically 2 (two) times daily. Do not apply to face Qty: 80 g, Refills: 1       Allergies  Allergen Reactions  . Codeine Nausea And Vomiting  . Hydrocodone-Acetaminophen Nausea And Vomiting   Follow-up Information    Follow up with Adrian Prows, MD On 04/07/2016.   Specialty:  Cardiology   Why:  Please go to [facility] office Early Monday morning to pick up Event Monitor   Contact information:   [place]  [phone]        The results of significant diagnostics from this hospitalization (including imaging, microbiology, ancillary and laboratory) are listed below for reference.    Significant Diagnostic Studies: Dg Chest 2 View  04/02/2016  CLINICAL DATA:  Shortness of breath and syncope. EXAM: CHEST -  2 VIEW COMPARISON:  12/19/2015 FINDINGS: The heart size and mediastinal contours are within normal limits. Stable probable chronic lung disease with suggestion of mild bilateral pulmonary hyperinflation. There is no evidence of pulmonary edema, consolidation, pneumothorax, nodule or pleural fluid. The thoracic spine shows osteopenia and spondylosis without visible fracture. IMPRESSION: No active disease.  Chronic lung disease.  No acute findings. Electronically Signed   By: Aletta Edouard M.D.   On: 04/02/2016 18:17   Dg Wrist Complete Left  03/15/2016  CLINICAL DATA:  New lifting injury of the wrist with a popping sensation 6 days ago. Continued lateral wrist pain EXAM: LEFT WRIST - COMPLETE 3+ VIEW COMPARISON:  02/14/2011 FINDINGS: Deformity from prior fifth metacarpal shaft fracture. New linear calcification in the expected location of the TFCC disc favoring chondrocalcinosis. Mild spurring of the distal pole of the scaphoid. Chondral thinning in the carpus leads to mild carpal crowding. There is some ridging of the midportion and distal pole of  the scaphoid. Upper normal with of the scapholunate distance. IMPRESSION: 1. I do not see an acute fracture. 2. Spurring in the scaphoid. 3. There is new chondrocalcinosis of the TFCC, raising the possibility of CPPD arthropathy. 4. If pain persists despite conservative therapy, MRI may be warranted for further characterization. Electronically Signed   By: Van Clines M.D.   On: 03/15/2016 08:19   Ct Head Wo Contrast  04/02/2016  CLINICAL DATA:  Recent fall with facial injury EXAM: CT HEAD WITHOUT CONTRAST CT MAXILLOFACIAL WITHOUT CONTRAST TECHNIQUE: Multidetector CT imaging of the head and maxillofacial structures were performed using the standard protocol without intravenous contrast. Multiplanar CT image reconstructions of the maxillofacial structures were also generated. COMPARISON:  10/02/2012 FINDINGS: CT HEAD FINDINGS The bony calvarium is intact. Mild  atrophic changes are noted commenced with the patient's given age. No findings to suggest acute hemorrhage, acute infarction or space-occupying mass lesion are noted. CT MAXILLOFACIAL FINDINGS Bony structures of the face show no acute fracture. Prior surgical changes are noted posterior to the right zygomatic arch. The orbits and their contents are within normal limits. The paranasal sinuses are unremarkable. Soft tissue swelling is noted below the nose centrally and eccentric to the right consistent with the recent injury. Degenerative changes of the temporomandibular joints are seen. No blowout fracture is identified. The visualized cervical spine shows degenerative change. No acute for abnormality is noted. IMPRESSION: CT of the head:  No acute intracranial abnormality noted. CT of the maxillofacial bones: No acute bony abnormality is noted. Soft tissue swelling over the right upper lip is seen consistent with the recent injury. Electronically Signed   By: Inez Catalina M.D.   On: 04/02/2016 19:11   Ct Angio Chest Pe W/cm &/or Wo Cm  04/03/2016  CLINICAL DATA:  Syncope and shortness of breath.  Elevated D-dimer. EXAM: CT ANGIOGRAPHY CHEST WITH CONTRAST TECHNIQUE: Multidetector CT imaging of the chest was performed using the standard protocol during bolus administration of intravenous contrast. Multiplanar CT image reconstructions and MIPs were obtained to evaluate the vascular anatomy. CONTRAST:  100 mL Isovue 370 IV COMPARISON:  Chest radiograph yesterday. Chest CT 04/18/2015, 03/13/2012 FINDINGS: Mediastinum/Lymph Nodes: No pulmonary emboli. Tortuous atherosclerotic aorta without aneurysm. Cannot assess for dissection given phase of contrast. No masses or pathologically enlarged lymph nodes identified. No pericardial effusion. Coronary artery calcifications. Lungs/Pleura: Mild emphysema. No consolidation. Multiple pulmonary nodules. Right middle lobe pulmonary nodule measures 1.4 x 1.1 cm image 62 series 6  unchanged adjacent 9 mm right middle lobe nodule image 58. Dominant right lower lobe nodule measures 1.7 x 1.6 cm image 62. Additional smaller right lower lobe pulmonary nodules are stable. Multiple small left lower lobe pulmonary nodules are unchanged. No new nodule. No pleural effusion. Upper abdomen: No acute findings. Musculoskeletal: No chest wall mass or suspicious bone lesions identified. Prominent Schmorl's node lower thoracic vertebra. Review of the MIP images confirms the above findings. IMPRESSION: 1. No pulmonary embolus. 2. No acute process. 3. Multiple bilateral pulmonary nodules, unchanged dating back to 2013 and considered benign. 4. Atherosclerosis and coronary artery calcifications. Electronically Signed   By: Jeb Levering M.D.   On: 04/03/2016 02:13   Dg Hand Complete Left  03/15/2016  CLINICAL DATA:  "Pop" in left wrist.  Pain. EXAM: LEFT HAND - COMPLETE 3+ VIEW COMPARISON:  None. FINDINGS: Degenerative changes in the fingers. No wrist abnormalities identified. IMPRESSION: Degenerative changes in the hand. Electronically Signed   By: Dorise Bullion III M.D  On: 03/15/2016 09:30   Ct Maxillofacial Wo Cm  04/02/2016  CLINICAL DATA:  Recent fall with facial injury EXAM: CT HEAD WITHOUT CONTRAST CT MAXILLOFACIAL WITHOUT CONTRAST TECHNIQUE: Multidetector CT imaging of the head and maxillofacial structures were performed using the standard protocol without intravenous contrast. Multiplanar CT image reconstructions of the maxillofacial structures were also generated. COMPARISON:  10/02/2012 FINDINGS: CT HEAD FINDINGS The bony calvarium is intact. Mild atrophic changes are noted commenced with the patient's given age. No findings to suggest acute hemorrhage, acute infarction or space-occupying mass lesion are noted. CT MAXILLOFACIAL FINDINGS Bony structures of the face show no acute fracture. Prior surgical changes are noted posterior to the right zygomatic arch. The orbits and their contents  are within normal limits. The paranasal sinuses are unremarkable. Soft tissue swelling is noted below the nose centrally and eccentric to the right consistent with the recent injury. Degenerative changes of the temporomandibular joints are seen. No blowout fracture is identified. The visualized cervical spine shows degenerative change. No acute for abnormality is noted. IMPRESSION: CT of the head:  No acute intracranial abnormality noted. CT of the maxillofacial bones: No acute bony abnormality is noted. Soft tissue swelling over the right upper lip is seen consistent with the recent injury. Electronically Signed   By: Inez Catalina M.D.   On: 04/02/2016 19:11    Microbiology: No results found for this or any previous visit (from the past 240 hour(s)).   Labs: Basic Metabolic Panel:  Recent Labs Lab 04/02/16 2007 04/02/16 2009 04/03/16 0205  NA 141 139 139  K 4.0 4.0 3.5  CL 104 106 106  CO2  --  21* 20*  GLUCOSE 210* 213* 173*  BUN '16 14 14  '$ CREATININE 0.90 0.98 1.08*  CALCIUM  --  8.5* 8.3*  MG  --   --  1.0*   Liver Function Tests:  Recent Labs Lab 04/02/16 2009 04/03/16 0205  AST 15 14*  ALT 14 13*  ALKPHOS 59 52  BILITOT 0.6 0.7  PROT 5.7* 5.2*  ALBUMIN 3.3* 2.9*   No results for input(s): LIPASE, AMYLASE in the last 168 hours. No results for input(s): AMMONIA in the last 168 hours. CBC:  Recent Labs Lab 04/02/16 2007 04/02/16 2009 04/03/16 0205  WBC  --  7.2 7.0  NEUTROABS  --  5.1  --   HGB 10.9* 10.3* 9.6*  HCT 32.0* 31.1* 29.4*  MCV  --  84.7 85.5  PLT  --  127* 107*   Cardiac Enzymes: No results for input(s): CKTOTAL, CKMB, CKMBINDEX, TROPONINI in the last 168 hours. BNP: BNP (last 3 results)  Recent Labs  04/17/15 1459 04/03/16 0038  BNP 302.0* 204.8*    ProBNP (last 3 results) No results for input(s): PROBNP in the last 8760 hours.  CBG:  Recent Labs Lab 04/03/16 1224 04/03/16 1633 04/03/16 2148 04/04/16 0613 04/04/16 1115   GLUCAP 211* 193* 165* 183* 189*       SignedDomenic Polite MD.  [facility] 04/04/2016, 1:18 PM

## 2016-04-04 NOTE — Care Management Note (Signed)
Case Management Note Marvetta Gibbons RN, BSN Unit 2W-Case Manager [phone]  Patient Details  Name: [name] MRN: [id number] Date of Birth: Jul 03, 1934  Subjective/Objective:   Pt admitted with syncope                 Action/Plan: PTA pt lived at home with spouse, has a walker (but it is very old per pt)- PT recommending HHPT and RW- pt would like a new RW- will need HH and DME orders prior to discharge- referral received for medication needs/pt not being able to afford meds- in to speak with pt at bedside- per conversation with pt she states that some of her copays are over $100, confirmed that she has [place]- pt reports that some of her medications are costly and she has difficulty affording them- as pt has medicare drug coverage she does not qualify for medication assistance programs or assistance with MATCH- discussed with pt that she may need to discuss with her PCP which medications are costly for her and see if there are alternatives that may can be prescribed. Pt also reports that she has home 02 with [facility].  CM to continue to follow for d/c needs  Expected Discharge Date:    04/04/16              Expected Discharge Plan:  [place]  In-House Referral:     Discharge planning Services  CM Consult, Medication Assistance  Post Acute Care Choice:  Home Health, Durable Medical Equipment Choice offered to:  Patient  DME Arranged:  Walker rolling DME Agency:  [place]:  PT [facility] Agency:  [place]  Status of Service:  Completed, signed off  Medicare Important Message Given:    Date Medicare IM Given:    Medicare IM give by:    Date Additional Medicare IM Given:    Additional Medicare Important Message give by:     If discussed at [place] of Stay Meetings, dates discussed:    Discharge Disposition: home/home health   Additional Comments:  04/04/16- 1400- Marvetta Gibbons RN, BSN- pt for d/c home today-  orders for DME-RW and HHPT placed- spoke with pt at bedside- who is agreeable to [facility]- list of [place] agencies for [facility] given to pt- per pt she would like to use "agency with hospital" confirmed with pt that she wants to use [facility]- referral called to Lelan Pons with [facility] for [place] services- also called Jermaine with [facility] for DME needs- RW to be delivered to room prior to pt discharge.   Dawayne Patricia, RN 04/04/2016, 2:03 PM

## 2016-04-04 NOTE — Progress Notes (Signed)
Pt has been discharged home with husband. CCMD was notified and telemetry box was removed. Pt's IV was removed with no complications. Pt received discharge instructions and all questions were answered. Pt was sent home with a rolling walker. Pt left the floor via wheelchair and was accompanied by myself. Pt was in no distress at time of discharge.   Grant Fontana RN, BSN

## 2016-04-07 ENCOUNTER — Observation Stay (HOSPITAL_COMMUNITY)
Admission: EM | Admit: 2016-04-07 | Discharge: 2016-04-09 | Disposition: A | Discharge status: Skilled Nursing Facility | Payer: Medicare Other | Attending: Family Medicine | Admitting: Family Medicine

## 2016-04-07 ENCOUNTER — Encounter (HOSPITAL_COMMUNITY): Payer: Self-pay | Admitting: Emergency Medicine

## 2016-04-07 ENCOUNTER — Emergency Department (HOSPITAL_COMMUNITY): Payer: Medicare Other

## 2016-04-07 DIAGNOSIS — I4891 Unspecified atrial fibrillation: Secondary | ICD-10-CM | POA: Diagnosis not present

## 2016-04-07 DIAGNOSIS — N289 Disorder of kidney and ureter, unspecified: Secondary | ICD-10-CM | POA: Insufficient documentation

## 2016-04-07 DIAGNOSIS — R748 Abnormal levels of other serum enzymes: Secondary | ICD-10-CM | POA: Insufficient documentation

## 2016-04-07 DIAGNOSIS — R0789 Other chest pain: Secondary | ICD-10-CM | POA: Diagnosis not present

## 2016-04-07 DIAGNOSIS — I2510 Atherosclerotic heart disease of native coronary artery without angina pectoris: Secondary | ICD-10-CM | POA: Diagnosis not present

## 2016-04-07 DIAGNOSIS — Z6822 Body mass index (BMI) 22.0-22.9, adult: Secondary | ICD-10-CM | POA: Diagnosis not present

## 2016-04-07 DIAGNOSIS — W1830XA Fall on same level, unspecified, initial encounter: Secondary | ICD-10-CM | POA: Insufficient documentation

## 2016-04-07 DIAGNOSIS — R079 Chest pain, unspecified: Secondary | ICD-10-CM | POA: Diagnosis not present

## 2016-04-07 DIAGNOSIS — Z8673 Personal history of transient ischemic attack (TIA), and cerebral infarction without residual deficits: Secondary | ICD-10-CM | POA: Diagnosis not present

## 2016-04-07 DIAGNOSIS — IMO0002 Reserved for concepts with insufficient information to code with codable children: Secondary | ICD-10-CM | POA: Diagnosis present

## 2016-04-07 DIAGNOSIS — D696 Thrombocytopenia, unspecified: Secondary | ICD-10-CM | POA: Diagnosis not present

## 2016-04-07 DIAGNOSIS — R296 Repeated falls: Secondary | ICD-10-CM | POA: Insufficient documentation

## 2016-04-07 DIAGNOSIS — E039 Hypothyroidism, unspecified: Secondary | ICD-10-CM | POA: Diagnosis not present

## 2016-04-07 DIAGNOSIS — D649 Anemia, unspecified: Secondary | ICD-10-CM | POA: Diagnosis present

## 2016-04-07 DIAGNOSIS — Z7901 Long term (current) use of anticoagulants: Secondary | ICD-10-CM | POA: Diagnosis not present

## 2016-04-07 DIAGNOSIS — Z79899 Other long term (current) drug therapy: Secondary | ICD-10-CM | POA: Insufficient documentation

## 2016-04-07 DIAGNOSIS — E46 Unspecified protein-calorie malnutrition: Secondary | ICD-10-CM | POA: Diagnosis not present

## 2016-04-07 DIAGNOSIS — E118 Type 2 diabetes mellitus with unspecified complications: Secondary | ICD-10-CM

## 2016-04-07 DIAGNOSIS — E1165 Type 2 diabetes mellitus with hyperglycemia: Secondary | ICD-10-CM

## 2016-04-07 DIAGNOSIS — R0781 Pleurodynia: Secondary | ICD-10-CM | POA: Insufficient documentation

## 2016-04-07 DIAGNOSIS — I10 Essential (primary) hypertension: Secondary | ICD-10-CM | POA: Diagnosis not present

## 2016-04-07 DIAGNOSIS — Y92000 Kitchen of unspecified non-institutional (private) residence as  the place of occurrence of the external cause: Secondary | ICD-10-CM | POA: Insufficient documentation

## 2016-04-07 DIAGNOSIS — E1129 Type 2 diabetes mellitus with other diabetic kidney complication: Secondary | ICD-10-CM | POA: Insufficient documentation

## 2016-04-07 DIAGNOSIS — E119 Type 2 diabetes mellitus without complications: Secondary | ICD-10-CM | POA: Diagnosis present

## 2016-04-07 DIAGNOSIS — I482 Chronic atrial fibrillation: Secondary | ICD-10-CM | POA: Insufficient documentation

## 2016-04-07 DIAGNOSIS — R627 Adult failure to thrive: Secondary | ICD-10-CM | POA: Insufficient documentation

## 2016-04-07 DIAGNOSIS — R55 Syncope and collapse: Secondary | ICD-10-CM | POA: Diagnosis not present

## 2016-04-07 DIAGNOSIS — E785 Hyperlipidemia, unspecified: Secondary | ICD-10-CM | POA: Diagnosis not present

## 2016-04-07 DIAGNOSIS — R269 Unspecified abnormalities of gait and mobility: Secondary | ICD-10-CM | POA: Diagnosis not present

## 2016-04-07 DIAGNOSIS — E876 Hypokalemia: Secondary | ICD-10-CM | POA: Diagnosis not present

## 2016-04-07 DIAGNOSIS — C7A090 Malignant carcinoid tumor of the bronchus and lung: Secondary | ICD-10-CM | POA: Diagnosis not present

## 2016-04-07 DIAGNOSIS — Z794 Long term (current) use of insulin: Secondary | ICD-10-CM | POA: Diagnosis present

## 2016-04-07 LAB — HEPATIC FUNCTION PANEL
ALT: 12 U/L — AB (ref 14–54)
AST: 14 U/L — ABNORMAL LOW (ref 15–41)
Albumin: 3 g/dL — ABNORMAL LOW (ref 3.5–5.0)
Alkaline Phosphatase: 63 U/L (ref 38–126)
BILIRUBIN DIRECT: 0.1 mg/dL (ref 0.1–0.5)
Indirect Bilirubin: 1 mg/dL — ABNORMAL HIGH (ref 0.3–0.9)
TOTAL PROTEIN: 5.8 g/dL — AB (ref 6.5–8.1)
Total Bilirubin: 1.1 mg/dL (ref 0.3–1.2)

## 2016-04-07 LAB — I-STAT TROPONIN, ED: TROPONIN I, POC: 0.15 ng/mL — AB (ref 0.00–0.08)

## 2016-04-07 LAB — IRON AND TIBC
IRON: 18 ug/dL — AB (ref 28–170)
Saturation Ratios: 7 % — ABNORMAL LOW (ref 10.4–31.8)
TIBC: 245 ug/dL — AB (ref 250–450)
UIBC: 227 ug/dL

## 2016-04-07 LAB — BASIC METABOLIC PANEL
ANION GAP: 13 (ref 5–15)
BUN: 9 mg/dL (ref 6–20)
CO2: 20 mmol/L — AB (ref 22–32)
Calcium: 8.9 mg/dL (ref 8.9–10.3)
Chloride: 111 mmol/L (ref 101–111)
Creatinine, Ser: 0.79 mg/dL (ref 0.44–1.00)
Glucose, Bld: 154 mg/dL — ABNORMAL HIGH (ref 65–99)
POTASSIUM: 3.4 mmol/L — AB (ref 3.5–5.1)
Sodium: 144 mmol/L (ref 135–145)

## 2016-04-07 LAB — MAGNESIUM: MAGNESIUM: 1.3 mg/dL — AB (ref 1.7–2.4)

## 2016-04-07 LAB — TROPONIN I: TROPONIN I: 0.14 ng/mL — AB (ref <0.031)

## 2016-04-07 LAB — CBC
HEMATOCRIT: 29.3 % — AB (ref 36.0–46.0)
HEMOGLOBIN: 9.8 g/dL — AB (ref 12.0–15.0)
MCH: 28.1 pg (ref 26.0–34.0)
MCHC: 33.4 g/dL (ref 30.0–36.0)
MCV: 84 fL (ref 78.0–100.0)
PLATELETS: 129 10*3/uL — AB (ref 150–400)
RBC: 3.49 MIL/uL — AB (ref 3.87–5.11)
RDW: 13.2 % (ref 11.5–15.5)
WBC: 5.4 10*3/uL (ref 4.0–10.5)

## 2016-04-07 LAB — RETICULOCYTES
RBC.: 3.49 MIL/uL — ABNORMAL LOW (ref 3.87–5.11)
RETIC COUNT ABSOLUTE: 62.8 10*3/uL (ref 19.0–186.0)
Retic Ct Pct: 1.8 % (ref 0.4–3.1)

## 2016-04-07 LAB — FERRITIN: FERRITIN: 159 ng/mL (ref 11–307)

## 2016-04-07 LAB — VITAMIN B12: VITAMIN B 12: 264 pg/mL (ref 180–914)

## 2016-04-07 LAB — GLUCOSE, CAPILLARY: Glucose-Capillary: 270 mg/dL — ABNORMAL HIGH (ref 65–99)

## 2016-04-07 LAB — FOLATE: Folate: 21.8 ng/mL (ref >5.9)

## 2016-04-07 LAB — TSH: TSH: 0.031 u[IU]/mL — AB (ref 0.350–4.500)

## 2016-04-07 MED ORDER — ISOSORBIDE MONONITRATE ER 60 MG PO TB24
60.0000 mg | ORAL_TABLET | Freq: Every day | ORAL | Status: DC
  Administered 2016-04-08 – 2016-04-09 (×2): 60 mg via ORAL
  Filled 2016-04-07 (×2): qty 1

## 2016-04-07 MED ORDER — ACETAMINOPHEN 325 MG PO TABS: 650.0000 mg | ORAL_TABLET | Freq: Four times a day (QID) | ORAL | Status: DC | PRN

## 2016-04-07 MED ORDER — INSULIN ASPART 100 UNIT/ML ~~LOC~~ SOLN
0.0000 [IU] | Freq: Every day | SUBCUTANEOUS | Status: DC
  Administered 2016-04-07: 3 [IU] via SUBCUTANEOUS
  Administered 2016-04-08: 2 [IU] via SUBCUTANEOUS

## 2016-04-07 MED ORDER — COLESEVELAM HCL 625 MG PO TABS
625.0000 mg | ORAL_TABLET | Freq: Every day | ORAL | Status: DC
  Administered 2016-04-08 – 2016-04-09 (×2): 625 mg via ORAL
  Filled 2016-04-07 (×2): qty 1

## 2016-04-07 MED ORDER — RIVAROXABAN 15 MG PO TABS
15.0000 mg | ORAL_TABLET | Freq: Every day | ORAL | Status: DC
  Administered 2016-04-08: 15 mg via ORAL
  Filled 2016-04-07: qty 1

## 2016-04-07 MED ORDER — TRIAMCINOLONE ACETONIDE 0.025 % EX CREA
1.0000 "application " | TOPICAL_CREAM | Freq: Two times a day (BID) | CUTANEOUS | Status: DC
  Administered 2016-04-07 – 2016-04-09 (×4): 1 via TOPICAL
  Filled 2016-04-07: qty 15

## 2016-04-07 MED ORDER — POTASSIUM CHLORIDE CRYS ER 20 MEQ PO TBCR
40.0000 meq | EXTENDED_RELEASE_TABLET | Freq: Two times a day (BID) | ORAL | Status: DC
  Administered 2016-04-07 – 2016-04-09 (×4): 40 meq via ORAL
  Filled 2016-04-07 (×4): qty 2

## 2016-04-07 MED ORDER — POLYETHYLENE GLYCOL 3350 17 G PO PACK: 17.0000 g | PACK | Freq: Every day | ORAL | Status: DC | PRN

## 2016-04-07 MED ORDER — CALCIUM-VITAMIN D3 600-500 MG-UNIT PO CAPS: 1.0000 | ORAL_CAPSULE | Freq: Every day | ORAL | Status: DC

## 2016-04-07 MED ORDER — LEVOTHYROXINE SODIUM 150 MCG PO TABS
150.0000 ug | ORAL_TABLET | Freq: Every day | ORAL | Status: DC
  Administered 2016-04-08 – 2016-04-09 (×2): 150 ug via ORAL
  Filled 2016-04-07: qty 1
  Filled 2016-04-07: qty 2
  Filled 2016-04-07: qty 1
  Filled 2016-04-07: qty 2

## 2016-04-07 MED ORDER — CLOBETASOL PROPIONATE 0.05 % EX CREA: 1.0000 "application " | TOPICAL_CREAM | Freq: Two times a day (BID) | CUTANEOUS | Status: DC

## 2016-04-07 MED ORDER — POLYVINYL ALCOHOL 1.4 % OP SOLN: 2.0000 [drp] | OPHTHALMIC | Status: DC | PRN

## 2016-04-07 MED ORDER — ONDANSETRON HCL 4 MG PO TABS: 4.0000 mg | ORAL_TABLET | Freq: Four times a day (QID) | ORAL | Status: DC | PRN

## 2016-04-07 MED ORDER — SODIUM CHLORIDE 0.9% FLUSH
3.0000 mL | Freq: Two times a day (BID) | INTRAVENOUS | Status: DC
  Administered 2016-04-07 – 2016-04-09 (×4): 3 mL via INTRAVENOUS

## 2016-04-07 MED ORDER — INSULIN ASPART 100 UNIT/ML ~~LOC~~ SOLN
0.0000 [IU] | Freq: Three times a day (TID) | SUBCUTANEOUS | Status: DC
  Administered 2016-04-08: 3 [IU] via SUBCUTANEOUS
  Administered 2016-04-08: 8 [IU] via SUBCUTANEOUS
  Administered 2016-04-09 (×2): 3 [IU] via SUBCUTANEOUS

## 2016-04-07 MED ORDER — CLOBETASOL PROPIONATE 0.05 % EX CREA
1.0000 "application " | TOPICAL_CREAM | Freq: Two times a day (BID) | CUTANEOUS | Status: DC
  Administered 2016-04-07 – 2016-04-09 (×4): 1 via TOPICAL
  Filled 2016-04-07: qty 15

## 2016-04-07 MED ORDER — FENTANYL CITRATE (PF) 100 MCG/2ML IJ SOLN
50.0000 ug | INTRAMUSCULAR | Status: AC | PRN
  Administered 2016-04-07 (×2): 50 ug via INTRAVENOUS
  Filled 2016-04-07 (×2): qty 2

## 2016-04-07 MED ORDER — ACETAMINOPHEN 650 MG RE SUPP: 650.0000 mg | Freq: Four times a day (QID) | RECTAL | Status: DC | PRN

## 2016-04-07 MED ORDER — SODIUM CHLORIDE 0.9 % IV SOLN: INTRAVENOUS | Status: AC

## 2016-04-07 MED ORDER — DILTIAZEM HCL 25 MG/5ML IV SOLN
20.0000 mg | Freq: Once | INTRAVENOUS | Status: AC
Start: 2016-04-07 — End: 2016-04-07
  Administered 2016-04-07: 20 mg via INTRAVENOUS
  Filled 2016-04-07: qty 5

## 2016-04-07 MED ORDER — ONDANSETRON HCL 4 MG/2ML IJ SOLN: 4.0000 mg | Freq: Four times a day (QID) | INTRAMUSCULAR | Status: DC | PRN

## 2016-04-07 MED ORDER — HYDROXYZINE PAMOATE 25 MG PO CAPS
25.0000 mg | ORAL_CAPSULE | Freq: Every day | ORAL | Status: DC
  Filled 2016-04-07: qty 1

## 2016-04-07 MED ORDER — ASPIRIN 81 MG PO CHEW
324.0000 mg | CHEWABLE_TABLET | Freq: Once | ORAL | Status: AC
  Administered 2016-04-07: 324 mg via ORAL
  Filled 2016-04-07: qty 4

## 2016-04-07 MED ORDER — SOTALOL HCL 80 MG PO TABS
80.0000 mg | ORAL_TABLET | Freq: Two times a day (BID) | ORAL | Status: DC
  Administered 2016-04-07 – 2016-04-08 (×2): 80 mg via ORAL
  Filled 2016-04-07 (×2): qty 1

## 2016-04-07 MED ORDER — CALCIUM CARBONATE-VITAMIN D 500-200 MG-UNIT PO TABS
1.0000 | ORAL_TABLET | Freq: Every day | ORAL | Status: DC
  Administered 2016-04-08 – 2016-04-09 (×2): 1 via ORAL
  Filled 2016-04-07 (×2): qty 1

## 2016-04-07 MED ORDER — MECLIZINE HCL 25 MG PO TABS: 25.0000 mg | ORAL_TABLET | Freq: Every day | ORAL | Status: DC | PRN

## 2016-04-07 MED ORDER — HYDROXYZINE HCL 25 MG PO TABS
25.0000 mg | ORAL_TABLET | Freq: Every day | ORAL | Status: DC
  Administered 2016-04-07 – 2016-04-08 (×2): 25 mg via ORAL
  Filled 2016-04-07: qty 1

## 2016-04-07 MED ORDER — ATORVASTATIN CALCIUM 40 MG PO TABS: 40.0000 mg | ORAL_TABLET | Freq: Every day | ORAL | Status: DC

## 2016-04-07 MED ORDER — DILTIAZEM HCL 25 MG/5ML IV SOLN
5.0000 mg | Freq: Once | INTRAVENOUS | Status: AC
  Administered 2016-04-07: 5 mg via INTRAVENOUS
  Filled 2016-04-07: qty 5

## 2016-04-07 MED ORDER — DILTIAZEM HCL ER COATED BEADS 180 MG PO CP24
180.0000 mg | ORAL_CAPSULE | Freq: Every day | ORAL | Status: DC
  Administered 2016-04-08 – 2016-04-09 (×2): 180 mg via ORAL
  Filled 2016-04-07 (×3): qty 1

## 2016-04-07 MED ORDER — ATORVASTATIN CALCIUM 40 MG PO TABS
40.0000 mg | ORAL_TABLET | Freq: Every day | ORAL | Status: DC
  Administered 2016-04-08 – 2016-04-09 (×2): 40 mg via ORAL
  Filled 2016-04-07 (×2): qty 1

## 2016-04-07 NOTE — H&P (Signed)
[facility] History and Physical  [name] NKN:[id number] DOB: 11/28/1934 DOA: 04/07/2016  Referring physician: Posey Pronto PCP: Dwan Bolt, MD   Chief Complaint: syncope and collapse  HPI: [name] is a  82 y.o. female past medical history that includes diabetes, stroke, CAD, hypothyroidism, since syncope with collapse presents to the emergency department from home with the chief complaint of syncope and collapse. Initial evaluation in the emergency department reveals A. fib with RVR and mildly elevated troponin.  Formation is obtained from the patient and the husband is at the bedside. She reports being discharged 2 days ago after hospitalization for syncope and collapse. She states she was "doing fine" but the husband said she was not ready to come home".  Husband states she almost fell the day she got home. He states today he witnessed her standing at the sink in the kitchen and her legs buckled and she went down to the floor. He did not hit her head. She does complain of some bilateral lower extremity pain. She also reports a ringing in her ears. She denies a chest pain palpitations headache numbness or tingling of extremities. She reports a dizziness with position change. She denies any abdominal pain nausea vomiting diarrhea constipation. She denies a dysuria hematuria frequency or urgency.  Emergency department she's afebrile hemodynamically stable and not hypoxic. She is provided with diltiazem 20 mg IV. Heart rate 104 on admission  Review of Systems:  10 point review of systems complete and all systems are negative except as indicated in the history of present illness   Past Medical History  Diagnosis Date  . Lung cancer ([place])   . Stroke ([place])   . Diabetes mellitus   . CAD (coronary artery disease) 03/13/2012  . Type II or unspecified type diabetes mellitus without mention of complication, not stated as uncontrolled 03/13/2012  . Hypothyroid 03/13/2012  .  HTN (hypertension) 03/13/2012  . Dysrhythmia     ATRIAL FIB  . Shortness of breath   . Syncope and collapse 04/02/2016   Past Surgical History  Procedure Laterality Date  . Cardioversion  04/20/2012    Procedure: CARDIOVERSION;  Surgeon: Laverda Page, MD;  Location: [place] [facility];  Service: Cardiovascular;  Laterality: N/A;  . Abdominal hysterectomy    . Tumors      back of head  . Hemorrhoid surgery    . Jaw replacement    . Cholecystectomy    . Breast surgery      benign tumors removed in 1980  . Cardiac catheterization    . Cardioversion N/A 05/10/2013    Procedure: CARDIOVERSION;  Surgeon: Laverda Page, MD;  Location: [place];  Service: Cardiovascular;  Laterality: N/A;  . Left heart catheterization with coronary angiogram N/A 04/20/2015    Procedure: LEFT HEART CATHETERIZATION WITH CORONARY ANGIOGRAM;  Surgeon: Adrian Prows, MD;  Location: [facility] CATH LAB;  Service: Cardiovascular;  Laterality: N/A;   Social History:  reports that she has never smoked. She has never used smokeless tobacco. She reports that she does not drink alcohol or use illicit drugs.  Allergies  Allergen Reactions  . Codeine Nausea And Vomiting  . Hydrocodone-Acetaminophen Nausea And Vomiting    Family History  Problem Relation Age of Onset  . Breast cancer Mother   . Aneurysm Father     Likely thoracic aneurysm per patient  . Melanoma Brother   . Heart disease Sister   . Diabetes type II Sister      Prior to  Admission medications   Medication Sig Start Date End Date Taking? Authorizing Provider  acetaminophen (TYLENOL) 500 MG tablet Take 500 mg by mouth every 6 (six) hours as needed for moderate pain or headache.    Yes Historical Provider, MD  atorvastatin (LIPITOR) 40 MG tablet Take 1 tablet (40 mg total) by mouth daily. 04/20/15  Yes Janece Canterbury, MD  Calcium Carb-Cholecalciferol (CALCIUM-VITAMIN D3) 600-500 MG-UNIT CAPS Take 1 capsule by mouth daily.   Yes Historical Provider, MD  clobetasol  (TEMOVATE) 0.05 % external solution Apply 1 application topically 2 (two) times daily. 03/31/16  Yes Historical Provider, MD  clobetasol cream (TEMOVATE) 5.91 % Apply 1 application topically 2 (two) times daily. 03/31/16  Yes Historical Provider, MD  colesevelam (WELCHOL) 625 MG tablet Take 625 mg by mouth daily.    Yes Historical Provider, MD  diltiazem (CARDIZEM CD) 180 MG 24 hr capsule Take 1 capsule (180 mg total) by mouth daily. 04/20/15  Yes Janece Canterbury, MD  hydrOXYzine (VISTARIL) 25 MG capsule Take 25 mg by mouth at bedtime. 03/31/16  Yes Historical Provider, MD  isosorbide mononitrate (IMDUR) 60 MG 24 hr tablet Take 1 tablet (60 mg total) by mouth daily. 04/20/15  Yes Janece Canterbury, MD  levothyroxine (SYNTHROID, LEVOTHROID) 150 MCG tablet Take 150 mcg by mouth daily.   Yes Historical Provider, MD  Mag Oxide-Vit D3-Turmeric (MAGNESIUM-VITAMIN D3-TURMERIC PO) Take 1 tablet by mouth daily.   Yes Historical Provider, MD  meclizine (ANTIVERT) 25 MG tablet Take 25 mg by mouth daily as needed for dizziness.    Yes Historical Provider, MD  Multiple Vitamins-Minerals (WOMENS BONE HEALTH PO) Take 1 tablet by mouth daily.   Yes Historical Provider, MD  Polyethyl Glycol-Propyl Glycol (SYSTANE) 0.4-0.3 % SOLN Apply 2 drops to eye 2 (two) times daily as needed (dry eyes).   Yes Historical Provider, MD  polyvinyl alcohol (LIQUIFILM TEARS) 1.4 % ophthalmic solution Place 2 drops into both eyes as needed for dry eyes.    Yes Historical Provider, MD  Rivaroxaban (XARELTO) 15 MG TABS tablet Take 15 mg by mouth daily with supper. 03/16/12  Yes Adrian Prows, MD  sotalol (BETAPACE) 80 MG tablet Take 80 mg by mouth 2 (two) times daily.   Yes Historical Provider, MD  traMADol (ULTRAM) 50 MG tablet Take 1 tablet (50 mg total) by mouth every 6 (six) hours as needed. 03/15/16  Yes Olivia Canter Sam, PA-C  triamcinolone (KENALOG) 0.025 % ointment Apply 1 application topically 2 (two) times daily. Do not apply to face 03/28/16  Yes  Montine Circle, PA-C   Physical Exam: Filed Vitals:   04/07/16 1615 04/07/16 1645 04/07/16 1715 04/07/16 1730  BP: 137/62 125/76 141/73 126/81  Pulse: 100 95 119 94  Temp:      TempSrc:      Resp: '24 20 16 21  '$ SpO2: 96% 98% 99% 99%    Wt Readings from Last 3 Encounters:  04/04/16 55.7 kg (122 lb 12.7 oz)  04/20/15 65.862 kg (145 lb 3.2 oz)  05/10/13 55.792 kg (123 lb)    General:  Appears calm and comfortable, thin and frail Eyes: PERRL, normal lids, irises & conjunctiva ENT: grossly normal hearing, lips & tongue bruising noted upper left extending down her chin to the right side of her neck Neck: no LAD, masses or thyromegaly Cardiovascular: Irregularly irregular, no m/r/g. No LE edema.  Respiratory: CTA bilaterally, no w/r/r. Normal respiratory effort. Abdomen: soft, ntnd positive bowel sounds Skin: no rash or induration seen on  limited exam Musculoskeletal: grossly normal tone BUE/BLE without swelling/erythema Psychiatric: grossly normal mood and affect, speech fluent and appropriate Neurologic: grossly non-focal. Bilateral grip 5 out of 5 lower extremity strength 5 out of 5 speech clear facial symmetry           Labs on Admission:  Basic Metabolic Panel:  Recent Labs Lab 04/02/16 2007 04/02/16 2009 04/03/16 0205 04/07/16 1427 04/07/16 1620  NA 141 139 139 144  --   K 4.0 4.0 3.5 3.4*  --   CL 104 106 106 111  --   CO2  --  21* 20* 20*  --   GLUCOSE 210* 213* 173* 154*  --   BUN '16 14 14 9  '$ --   CREATININE 0.90 0.98 1.08* 0.79  --   CALCIUM  --  8.5* 8.3* 8.9  --   MG  --   --  1.0*  --  1.3*   Liver Function Tests:  Recent Labs Lab 04/02/16 2009 04/03/16 0205 04/07/16 1620  AST 15 14* 14*  ALT 14 13* 12*  ALKPHOS 59 52 63  BILITOT 0.6 0.7 1.1  PROT 5.7* 5.2* 5.8*  ALBUMIN 3.3* 2.9* 3.0*   No results for input(s): LIPASE, AMYLASE in the last 168 hours. No results for input(s): AMMONIA in the last 168 hours. CBC:  Recent Labs Lab  04/02/16 2007 04/02/16 2009 04/03/16 0205 04/07/16 1427  WBC  --  7.2 7.0 5.4  NEUTROABS  --  5.1  --   --   HGB 10.9* 10.3* 9.6* 9.8*  HCT 32.0* 31.1* 29.4* 29.3*  MCV  --  84.7 85.5 84.0  PLT  --  127* 107* 129*   Cardiac Enzymes: No results for input(s): CKTOTAL, CKMB, CKMBINDEX, TROPONINI in the last 168 hours.  BNP (last 3 results)  Recent Labs  04/17/15 1459 04/03/16 0038  BNP 302.0* 204.8*    ProBNP (last 3 results) No results for input(s): PROBNP in the last 8760 hours.  CBG:  Recent Labs Lab 04/03/16 1224 04/03/16 1633 04/03/16 2148 04/04/16 0613 04/04/16 1115  GLUCAP 211* 193* 165* 183* 189*    Radiological Exams on Admission: Dg Chest Portable 1 View  04/07/2016  CLINICAL DATA:  Left-sided chest pain. Multiple recent falls including 1 fall yesterday. Left lower rib pain. EXAM: PORTABLE CHEST 1 VIEW COMPARISON:  Chest CT dated 04/03/2016 and chest x-rays dated 04/02/2016 and 12/19/2015 FINDINGS: There are multiple old healed left posterior lateral rib fractures. Diffuse osteopenia. Chronic borderline cardiomegaly. Pulmonary vascularity is normal and the lungs are clear although somewhat hyperinflated suggesting emphysema. Calcification in the thoracic aorta. No acute bone abnormality. IMPRESSION: No acute abnormality. Borderline cardiomegaly. Emphysema. Multiple old left rib fractures. Osteopenia. Electronically Signed   By: Lorriane Shire M.D.   On: 04/07/2016 15:02    EKG: Independently reviewed. Atrial fibrillation with rapid ventricular response Probable lateral infarct, age indeterminate Since last tracing rate faster Otherwise no significant change  Assessment/Plan Principal Problem:   Syncope and collapse Active Problems:   Atrial fibrillation (HCC) CHA2DS2-VASc Score 7   HTN (hypertension)   Hypothyroid   DM (diabetes mellitus), type 2, uncontrolled, with renal complications (HCC)   CAD (coronary artery disease)   Hypokalemia    Thrombocytopenia (HCC)   Anemia  #1. Syncope and collapse. Recently discharged for same. At that time chart indicates suspected to be vasovagal in etiology and 30 day event monitor planned. No indication of infectious process. No metabolic derangement. CT of the head 2 days  ago about acute abnormality, echocardiogram 4 days ago with an EF of 60%. EKG as noted above. Neuro exam benign -Admit to telemetry -Obtain orthostatic vital signs -Consider MRI -Physical therapy for vestibular exercises -Continue meclizine  2. A. fib with RVR. chadvasc score 7.  Likely related to missed medication doses She was provided with IV Cardizem and home diltiazem initiated as well -Monitor on telemetry -Cycle troponin -Continue Xarelto -Follow-up outpatient 30 day event monitor  #3. Hypertension. Stable in the emergency department. Home medications include Cardizem, Imdur -Continue home medications  #4. Diabetes. Glucose 154 on admission. An oral agents. -Hold oral agents -Obtain a hemoglobin A1c -Use sliding scale insulin for control  #5. Hypokalemia. Mild. -Replete -Obtain a magnesium level -Recheck in the morning  #6. Thrombocytopenia. Chart review indicates chronic -Current level close to baseline -Signs symptoms of overt bleeding -Outpatient follow-up  #7. Elevated troponin/CAD. Mild. Likely related to demand -Cycle troponin -Continue home meds as noted  #8 anemia: normocytic. No recent colonoscopy noted in chart review. She did have a esophagram/barium swallow in 2015 revealing esophageal dysmotility -FOBT -Anemia panel -Outpatient follow-up    Code Status: full DVT Prophylaxis: Family Communication: husband at bedside Disposition Plan: home when ready  Time spent: 45 minutes  [facility]

## 2016-04-07 NOTE — Progress Notes (Signed)
Pt. With HR sustaining in the 120's- 140's. Pt. Asymptomatic and resting in bed. On call NP, Tylene Fantasia, for [facility] made aware via text page. RN will continue to monitor pt for changes in condition. Norma Ignasiak, Katherine Roan'

## 2016-04-07 NOTE — ED Notes (Signed)
Attempted report 

## 2016-04-07 NOTE — ED Provider Notes (Signed)
CSN: [id number]     Arrival date & time 04/07/16  1416 History   First MD Initiated Contact with Patient 04/07/16 1503     Chief Complaint  Patient presents with  . Atrial Fibrillation  . Fall     (Consider location/radiation/quality/duration/timing/severity/associated sxs/prior Treatment) HPI   [name] is a 82 year old female with PMH of Afib on Xarelto, CAD, HTN, Lung cancer, and T2DM who presents with syncopal episode. She has had frequent falls recently and was admitted from 4/5-4/7 for a similar episode. Her syncopal episode was suspected to be vasovagal in etiology and she was to pick up a 30 day event monitor today. She has bruising on her face that was from her fall last week. Patient states she was standing in her kitchen this morning, using a walker, when she felt like her "head was spinning" and lost consciousness. She fell to the ground, hitting the floor on her left side. Her husband was present at the time. She has pain at her left chest wall where she fell and reports generalized soreness and "heavy" feeling. She reports ringing in her ears. She denied hitting her head, preceding chest pain, palpitations, diaphoresis, nausea, vomiting, slurred speech, focal weakness, or confusion.   Past Medical History  Diagnosis Date  . Lung cancer ([place])   . Stroke ([place])   . Diabetes mellitus   . CAD (coronary artery disease) 03/13/2012  . Type II or unspecified type diabetes mellitus without mention of complication, not stated as uncontrolled 03/13/2012  . Hypothyroid 03/13/2012  . HTN (hypertension) 03/13/2012  . Dysrhythmia     ATRIAL FIB  . Shortness of breath   . Syncope and collapse 04/02/2016   Past Surgical History  Procedure Laterality Date  . Cardioversion  04/20/2012    Procedure: CARDIOVERSION;  Surgeon: Laverda Page, MD;  Location: [place];  Service: Cardiovascular;  Laterality: N/A;  . Abdominal hysterectomy    . Tumors      back of head  . Hemorrhoid surgery     . Jaw replacement    . Cholecystectomy    . Breast surgery      benign tumors removed in 1980  . Cardiac catheterization    . Cardioversion N/A 05/10/2013    Procedure: CARDIOVERSION;  Surgeon: Laverda Page, MD;  Location: [place];  Service: Cardiovascular;  Laterality: N/A;  . Left heart catheterization with coronary angiogram N/A 04/20/2015    Procedure: LEFT HEART CATHETERIZATION WITH CORONARY ANGIOGRAM;  Surgeon: Adrian Prows, MD;  Location: [facility] CATH LAB;  Service: Cardiovascular;  Laterality: N/A;   Family History  Problem Relation Age of Onset  . Breast cancer Mother   . Aneurysm Father     Likely thoracic aneurysm per patient  . Melanoma Brother   . Heart disease Sister   . Diabetes type II Sister    Social History  Substance Use Topics  . Smoking status: Never Smoker   . Smokeless tobacco: Never Used  . Alcohol Use: No   OB History    No data available     Review of Systems  Constitutional: Positive for fatigue. Negative for fever, chills and diaphoresis.  HENT: Positive for tinnitus.   Eyes: Negative for visual disturbance.  Respiratory: Negative for cough, shortness of breath and wheezing.   Cardiovascular: Negative for palpitations and leg swelling.       Left chest wall pain  Gastrointestinal: Negative for nausea, vomiting, abdominal pain, diarrhea and constipation.  Genitourinary: Positive  for frequency. Negative for dysuria.  Neurological: Positive for dizziness and syncope. Negative for seizures, facial asymmetry, speech difficulty, numbness and headaches.      Allergies  Codeine and Hydrocodone-acetaminophen  Home Medications   Prior to Admission medications   Medication Sig Start Date End Date Taking? Authorizing Provider  acetaminophen (TYLENOL) 500 MG tablet Take 500 mg by mouth every 6 (six) hours as needed for moderate pain or headache.    Yes Historical Provider, MD  atorvastatin (LIPITOR) 40 MG tablet Take 1 tablet (40 mg total) by  mouth daily. 04/20/15  Yes Janece Canterbury, MD  Calcium Carb-Cholecalciferol (CALCIUM-VITAMIN D3) 600-500 MG-UNIT CAPS Take 1 capsule by mouth daily.   Yes Historical Provider, MD  clobetasol (TEMOVATE) 0.05 % external solution Apply 1 application topically 2 (two) times daily. 03/31/16  Yes Historical Provider, MD  clobetasol cream (TEMOVATE) 9.50 % Apply 1 application topically 2 (two) times daily. 03/31/16  Yes Historical Provider, MD  colesevelam (WELCHOL) 625 MG tablet Take 625 mg by mouth daily.    Yes Historical Provider, MD  diltiazem (CARDIZEM CD) 180 MG 24 hr capsule Take 1 capsule (180 mg total) by mouth daily. 04/20/15  Yes Janece Canterbury, MD  hydrOXYzine (VISTARIL) 25 MG capsule Take 25 mg by mouth at bedtime. 03/31/16  Yes Historical Provider, MD  isosorbide mononitrate (IMDUR) 60 MG 24 hr tablet Take 1 tablet (60 mg total) by mouth daily. 04/20/15  Yes Janece Canterbury, MD  levothyroxine (SYNTHROID, LEVOTHROID) 150 MCG tablet Take 150 mcg by mouth daily.   Yes Historical Provider, MD  Mag Oxide-Vit D3-Turmeric (MAGNESIUM-VITAMIN D3-TURMERIC PO) Take 1 tablet by mouth daily.   Yes Historical Provider, MD  meclizine (ANTIVERT) 25 MG tablet Take 25 mg by mouth daily as needed for dizziness.    Yes Historical Provider, MD  Multiple Vitamins-Minerals (WOMENS BONE HEALTH PO) Take 1 tablet by mouth daily.   Yes Historical Provider, MD  Polyethyl Glycol-Propyl Glycol (SYSTANE) 0.4-0.3 % SOLN Apply 2 drops to eye 2 (two) times daily as needed (dry eyes).   Yes Historical Provider, MD  polyvinyl alcohol (LIQUIFILM TEARS) 1.4 % ophthalmic solution Place 2 drops into both eyes as needed for dry eyes.    Yes Historical Provider, MD  Rivaroxaban (XARELTO) 15 MG TABS tablet Take 15 mg by mouth daily with supper. 03/16/12  Yes Adrian Prows, MD  sotalol (BETAPACE) 80 MG tablet Take 80 mg by mouth 2 (two) times daily.   Yes Historical Provider, MD  traMADol (ULTRAM) 50 MG tablet Take 1 tablet (50 mg total) by mouth  every 6 (six) hours as needed. 03/15/16  Yes Olivia Canter Sam, PA-C  triamcinolone (KENALOG) 0.025 % ointment Apply 1 application topically 2 (two) times daily. Do not apply to face 03/28/16  Yes Montine Circle, PA-C   BP 137/62 mmHg  Pulse 100  Temp(Src) 98.1 F (36.7 C) (Oral)  Resp 24  SpO2 96% Physical Exam  Constitutional: She is oriented to person, place, and time. No distress.  Thin elderly woman, resting in bed  HENT:  Head: Normocephalic.  Mouth/Throat: Oropharynx is clear and moist.  Bruising upper lip and down right chin to anterior neck  Eyes: EOM are normal. Pupils are equal, round, and reactive to light.  Cardiovascular: Intact distal pulses.  An irregularly irregular rhythm present. Tachycardia present.   Pulmonary/Chest: Effort normal. She has no wheezes. She has no rales.  Chest tenderness at left lateral wall  Abdominal: Soft. She exhibits no distension. There is no  tenderness.  Musculoskeletal: She exhibits no edema.  Neurological: She is alert and oriented to person, place, and time.  Strength intact bilateral upper extremities, decreased bilateral lower extremities, no facial drooping, slurred speech, or focal deficit appreciated on exam  Skin: She is not diaphoretic.  Psychiatric: She has a normal mood and affect.    ED Course  Procedures (including critical care time) Labs Review Labs Reviewed  BASIC METABOLIC PANEL - Abnormal; Notable for the following:    Potassium 3.4 (*)    CO2 20 (*)    Glucose, Bld 154 (*)    All other components within normal limits  CBC - Abnormal; Notable for the following:    RBC 3.49 (*)    Hemoglobin 9.8 (*)    HCT 29.3 (*)    Platelets 129 (*)    All other components within normal limits  I-STAT TROPOININ, ED - Abnormal; Notable for the following:    Troponin i, poc 0.15 (*)    All other components within normal limits  MAGNESIUM  HEPATIC FUNCTION PANEL    Imaging Review Dg Chest Portable 1 View  04/07/2016  CLINICAL  DATA:  Left-sided chest pain. Multiple recent falls including 1 fall yesterday. Left lower rib pain. EXAM: PORTABLE CHEST 1 VIEW COMPARISON:  Chest CT dated 04/03/2016 and chest x-rays dated 04/02/2016 and 12/19/2015 FINDINGS: There are multiple old healed left posterior lateral rib fractures. Diffuse osteopenia. Chronic borderline cardiomegaly. Pulmonary vascularity is normal and the lungs are clear although somewhat hyperinflated suggesting emphysema. Calcification in the thoracic aorta. No acute bone abnormality. IMPRESSION: No acute abnormality. Borderline cardiomegaly. Emphysema. Multiple old left rib fractures. Osteopenia. Electronically Signed   By: Lorriane Shire M.D.   On: 04/07/2016 15:02   I have personally reviewed and evaluated these images and lab results as part of my medical decision-making.   EKG Interpretation   Date/Time:  Monday April 07 2016 14:54:08 EDT Ventricular Rate:  127 PR Interval:    QRS Duration: 108 QT Interval:  263 QTC Calculation: 382 R Axis:   102 Text Interpretation:  Atrial fibrillation with rapid ventricular response  Probable lateral infarct, age indeterminate Since last tracing rate faster  Otherwise no significant change Confirmed by KNOTT MD, DANIEL [phone]) on  04/07/2016 3:06:57 PM      MDM   Final diagnoses:  Syncope, unspecified syncope type  Atrial fibrillation with RVR ([place])   82 year old female with PMH of Afib on Xarelto, CAD, HTN, Lung cancer, and T2DM who presents with a recurrent syncopal episode. She is in Afib with RVR on arrival with rate up to 160.  Her syncope is possibly cardiogenic or vasovagal in nature. EKGs show Afib with RVR and possible lateral infarct of indeterminate age. She does have an elevated Troponin of 0.15, which may be related to demand ischemia. She has no focal deficit on exam to suggest acute CVA.  Patient given IV Diltiazem 20 mg bolus once with improvement in rate to low 100s. Also added back home Diltiazem  180 mg po daily as she has not taken her morning meds. Discussed with [facility] service who have agreed to admit patient.     Zada Finders, MD 04/07/16 1652  Leo Grosser, MD 04/08/16 [phone]

## 2016-04-07 NOTE — ED Notes (Signed)
[facility] from home, multiple recent falls, most recently yesterday, today c/o left sided pain, bilat foot pain and general malaise, no neuro deficits, A-fib RVR 110 -140 20g left forearm

## 2016-04-07 NOTE — Progress Notes (Signed)
Attempted to get pts. Orthostatic vitals. Pt. Unable to stay in bed to obtain at this time. Will continue to try. [name], [name]

## 2016-04-08 DIAGNOSIS — I10 Essential (primary) hypertension: Secondary | ICD-10-CM

## 2016-04-08 DIAGNOSIS — E118 Type 2 diabetes mellitus with unspecified complications: Secondary | ICD-10-CM | POA: Insufficient documentation

## 2016-04-08 DIAGNOSIS — I482 Chronic atrial fibrillation: Secondary | ICD-10-CM | POA: Diagnosis not present

## 2016-04-08 DIAGNOSIS — R55 Syncope and collapse: Secondary | ICD-10-CM | POA: Insufficient documentation

## 2016-04-08 DIAGNOSIS — R627 Adult failure to thrive: Secondary | ICD-10-CM | POA: Diagnosis not present

## 2016-04-08 DIAGNOSIS — E1165 Type 2 diabetes mellitus with hyperglycemia: Secondary | ICD-10-CM

## 2016-04-08 DIAGNOSIS — E1129 Type 2 diabetes mellitus with other diabetic kidney complication: Secondary | ICD-10-CM | POA: Diagnosis not present

## 2016-04-08 DIAGNOSIS — I4891 Unspecified atrial fibrillation: Secondary | ICD-10-CM | POA: Diagnosis not present

## 2016-04-08 DIAGNOSIS — I2510 Atherosclerotic heart disease of native coronary artery without angina pectoris: Secondary | ICD-10-CM | POA: Diagnosis not present

## 2016-04-08 LAB — HEMOGLOBIN A1C
HEMOGLOBIN A1C: 7.9 % — AB (ref 4.8–5.6)
MEAN PLASMA GLUCOSE: 180 mg/dL

## 2016-04-08 LAB — BASIC METABOLIC PANEL
Anion gap: 10 (ref 5–15)
BUN: 10 mg/dL (ref 6–20)
CALCIUM: 8.5 mg/dL — AB (ref 8.9–10.3)
CO2: 22 mmol/L (ref 22–32)
CREATININE: 0.81 mg/dL (ref 0.44–1.00)
Chloride: 109 mmol/L (ref 101–111)
GFR calc Af Amer: >60 mL/min (ref >60)
GLUCOSE: 178 mg/dL — AB (ref 65–99)
Potassium: 3.8 mmol/L (ref 3.5–5.1)
SODIUM: 141 mmol/L (ref 135–145)

## 2016-04-08 LAB — CBC
HCT: 27.9 % — ABNORMAL LOW (ref 36.0–46.0)
Hemoglobin: 9.2 g/dL — ABNORMAL LOW (ref 12.0–15.0)
MCH: 28 pg (ref 26.0–34.0)
MCHC: 33 g/dL (ref 30.0–36.0)
MCV: 84.8 fL (ref 78.0–100.0)
PLATELETS: 121 10*3/uL — AB (ref 150–400)
RBC: 3.29 MIL/uL — ABNORMAL LOW (ref 3.87–5.11)
RDW: 13.3 % (ref 11.5–15.5)
WBC: 4.8 10*3/uL (ref 4.0–10.5)

## 2016-04-08 LAB — GLUCOSE, CAPILLARY
GLUCOSE-CAPILLARY: 265 mg/dL — AB (ref 65–99)
GLUCOSE-CAPILLARY: 119 mg/dL — AB (ref 65–99)
GLUCOSE-CAPILLARY: 235 mg/dL — AB (ref 65–99)
Glucose-Capillary: 174 mg/dL — ABNORMAL HIGH (ref 65–99)

## 2016-04-08 LAB — T4, FREE: FREE T4: 1.27 ng/dL — AB (ref 0.61–1.12)

## 2016-04-08 LAB — TROPONIN I: TROPONIN I: 0.1 ng/mL — AB (ref <0.031)

## 2016-04-08 MED ORDER — SOTALOL HCL 80 MG PO TABS
120.0000 mg | ORAL_TABLET | Freq: Two times a day (BID) | ORAL | Status: DC
  Administered 2016-04-09: 120 mg via ORAL
  Filled 2016-04-08 (×2): qty 2

## 2016-04-08 MED ORDER — SOTALOL HCL 80 MG PO TABS
40.0000 mg | ORAL_TABLET | Freq: Once | ORAL | Status: AC
  Administered 2016-04-08: 40 mg via ORAL
  Filled 2016-04-08: qty 1

## 2016-04-08 MED ORDER — TRAMADOL HCL 50 MG PO TABS
50.0000 mg | ORAL_TABLET | Freq: Four times a day (QID) | ORAL | Status: DC | PRN
  Administered 2016-04-08 (×2): 50 mg via ORAL
  Filled 2016-04-08 (×2): qty 1

## 2016-04-08 NOTE — Progress Notes (Addendum)
PROGRESS NOTE  [name] RKY:[id number] DOB: [birth year] DOA: 04/07/2016 PCP: Dwan Bolt, MD  HPI/Recap of past 24 hours:  patient is a 82 year old female with past history of lung cancer, CVA, diabetes mellitus and atrial fibrillation who was discharged from the hospitalist service to week ago after sudden onset syncopal episode that led to significant fall with facial bruising. Workup unrevealing and plan was for patient to follow-up with 30 day Holter monitor. Last night, patient had a similar event of sudden onset syncope and fell back into her husband's arms. She was brought into the emergency room.  Initial labs unrevealing and patient not orthostatic. Troponin minimally elevated at 0.14 yesterday, but down 0.1 today.-More likely from atrial fibrillation She is found to be in mild rapid ventricular rate   This morning, patient complains of soreness across her torso where her husband had caught her as well as old rib fractures. No new rib fractures noted. Breathing stable.  Assessment/Plan: Principal Problem:   Syncope and collapse: Unclear etiology. No warning signs. No lightheadedness. No significant drop in hemoglobin. This does not sound neurologic, no description of vertigo-like symptoms. Have reconsulted her cardiologist. Continue on telemetry. Active Problems:   Atrial fibrillation (HCC) CHA2DS2-VASc Score 7: Currently rate controlled, on anticoagulation   HTN (hypertension)   Hypothyroid: Continue Synthroid   DM (diabetes mellitus), type 2, uncontrolled, with renal complications [facility]): Blood sugars this morning under 200   CAD (coronary artery disease)   Hypokalemia   Thrombocytopenia (HCC)   Anemia   Protein calorie malnutrition: We'll consult dietary   Code Status:Full code Family Communication: Husband at the bedside  Disposition Plan: Likely here for next one to 2 days further workup     Consultants:  Cardiology   Procedures:  None     Antibiotics:  None    Objective: BP 140/69 mmHg  Pulse 97  Temp(Src) 97.4 F (36.3 C) (Oral)  Resp 20  Ht '5\' 3"'$  (1.6 m)  Wt 55.475 kg (122 lb 4.8 oz)  BMI 21.67 kg/m2  SpO2 98%  Intake/Output Summary (Last 24 hours) at 04/08/16 1038 Last data filed at 04/08/16 0905  Gross per 24 hour  Intake    480 ml  Output    450 ml  Net     30 ml   Filed Weights   04/07/16 1817 04/08/16 0527  Weight: 55.203 kg (121 lb 11.2 oz) 55.475 kg (122 lb 4.8 oz)    Exam:   General:  Alert and oriented 3, mild distress secondary to soreness   Cardiovascular:  Irregular rhythm, rate controlled   Respiratory: clear to auscultation bilaterally   Abdomen: soft, nontender, nondistended, positive bowel sounds    Musculoskeletal: no clubbing or cyanosis or edema     Data Reviewed: Basic Metabolic Panel:  Recent Labs Lab 04/02/16 2007 04/02/16 2009 04/03/16 0205 04/07/16 1427 04/07/16 1620 04/08/16 0409  NA 141 139 139 144  --  141  K 4.0 4.0 3.5 3.4*  --  3.8  CL 104 106 106 111  --  109  CO2  --  21* 20* 20*  --  22  GLUCOSE 210* 213* 173* 154*  --  178*  BUN '16 14 14 9  '$ --  10  CREATININE 0.90 0.98 1.08* 0.79  --  0.81  CALCIUM  --  8.5* 8.3* 8.9  --  8.5*  MG  --   --  1.0*  --  1.3*  --    Liver Function Tests:  Recent Labs Lab 04/02/16 2009 04/03/16 0205 04/07/16 1620  AST 15 14* 14*  ALT 14 13* 12*  ALKPHOS 59 52 63  BILITOT 0.6 0.7 1.1  PROT 5.7* 5.2* 5.8*  ALBUMIN 3.3* 2.9* 3.0*   No results for input(s): LIPASE, AMYLASE in the last 168 hours. No results for input(s): AMMONIA in the last 168 hours. CBC:  Recent Labs Lab 04/02/16 2007 04/02/16 2009 04/03/16 0205 04/07/16 1427 04/08/16 0409  WBC  --  7.2 7.0 5.4 4.8  NEUTROABS  --  5.1  --   --   --   HGB 10.9* 10.3* 9.6* 9.8* 9.2*  HCT 32.0* 31.1* 29.4* 29.3* 27.9*  MCV  --  84.7 85.5 84.0 84.8  PLT  --  127* 107* 129* 121*   Cardiac Enzymes:    Recent Labs Lab 04/07/16 1822  04/08/16 0409  TROPONINI 0.14* 0.10*   BNP (last 3 results)  Recent Labs  04/17/15 1459 04/03/16 0038  BNP 302.0* 204.8*    ProBNP (last 3 results) No results for input(s): PROBNP in the last 8760 hours.  CBG:  Recent Labs Lab 04/03/16 2148 04/04/16 0613 04/04/16 1115 04/07/16 2111 04/08/16 0547  GLUCAP 165* 183* 189* 270* 174*    No results found for this or any previous visit (from the past 240 hour(s)).   Studies: Dg Chest Portable 1 View  04/07/2016  CLINICAL DATA:  Left-sided chest pain. Multiple recent falls including 1 fall yesterday. Left lower rib pain. EXAM: PORTABLE CHEST 1 VIEW COMPARISON:  Chest CT dated 04/03/2016 and chest x-rays dated 04/02/2016 and 12/19/2015 FINDINGS: There are multiple old healed left posterior lateral rib fractures. Diffuse osteopenia. Chronic borderline cardiomegaly. Pulmonary vascularity is normal and the lungs are clear although somewhat hyperinflated suggesting emphysema. Calcification in the thoracic aorta. No acute bone abnormality. IMPRESSION: No acute abnormality. Borderline cardiomegaly. Emphysema. Multiple old left rib fractures. Osteopenia. Electronically Signed   By: Lorriane Shire M.D.   On: 04/07/2016 15:02    Scheduled Meds: . atorvastatin  40 mg Oral Daily  . calcium-vitamin D  1 tablet Oral Q breakfast  . clobetasol cream  1 application Topical BID  . colesevelam  625 mg Oral Daily  . diltiazem  180 mg Oral Daily  . hydrOXYzine  25 mg Oral QHS  . insulin aspart  0-15 Units Subcutaneous TID WC  . insulin aspart  0-5 Units Subcutaneous QHS  . isosorbide mononitrate  60 mg Oral Daily  . levothyroxine  150 mcg Oral Daily  . potassium chloride  40 mEq Oral BID  . Rivaroxaban  15 mg Oral Q supper  . sodium chloride flush  3 mL Intravenous Q12H  . sotalol  120 mg Oral BID  . sotalol  40 mg Oral Once  . triamcinolone  1 application Topical BID    Continuous Infusions:    Time spent: 25 min   [facility] Pager [phone] . If 7PM-7AM, please contact night-coverage at [email or web address], password [facility] 04/08/2016, 10:38 AM

## 2016-04-08 NOTE — Progress Notes (Signed)
Pt. Restarting Sotalol this pm. Instructions to contact MD for QTc >450 before administering noted. EKG completed and pts. QTc 453. Text page sent to on call for Cardiology for further instructions. On coming RN made aware.

## 2016-04-08 NOTE — Consult Note (Signed)
   [facility] CM Inpatient Consult   04/08/2016  [name] [birth year] [id number] Patient was evaluated for [facility] services for re-admissions and with HX of Diabetes, Stroke, Atrial Fibrillation, and Falls as discussed in progression meeting.  Came by to see the patient and she was receiving physical therapy care.  Patient states she is not feeling good today. Will follow up as time allows to explain services at a more appropriate time. For questions, please contact: Natividad Brood, RN BSN [facility] Liaison  [phone] business mobile phone Toll free office [phone]

## 2016-04-08 NOTE — Progress Notes (Signed)
Inpatient Diabetes Program Recommendations  AACE/ADA: New Consensus Statement on Inpatient Glycemic Control (2015)  Target Ranges:  Prepandial:   less than 140 mg/dL      Peak postprandial:   less than 180 mg/dL (1-2 hours)      Critically ill patients:  140 - 180 mg/dL   Review of Glycemic Control  Inpatient Diabetes Program Recommendations:    Add carb modified to current diet. May need Novolog meal coverage for elevated postprandials.  Thank you  Raoul Pitch BSN, RN,CDE Inpatient Diabetes Coordinator [phone] (team pager)

## 2016-04-08 NOTE — Evaluation (Signed)
Physical Therapy Evaluation Patient Details Name: [name] MRN: [id number] DOB: [birth year] Today's Date: 04/08/2016   History of Present Illness  pt presents with Syncopal Episodes resulting in falls.  pt has recent admit for the same.  pt with hx of Lung CA and multiple tumors removed, CVA, DM, CAD, HTN, Hypothyroidism, A-fib, and Jaw Replacement.    Clinical Impression  Pt very painful with all mobility limiting ability to tolerate Vestibular testing.  Pt denies dizziness with head turns and smooth pursuits, but does endorse pain in chest and abdomen with head turns.  Pt indicates she never has symptoms of feeling dizzy when she has a syncopal episode, but states it feels like "I don't have enough air".  Pt at this time is not safe for return to home with husband and would benefit from SNF level of care at D/C.  Will continue to follow.      Follow Up Recommendations SNF    Equipment Recommendations  Rolling walker with 5" wheels    Recommendations for Other Services       Precautions / Restrictions Precautions Precautions: Fall Restrictions Weight Bearing Restrictions: No      Mobility  Bed Mobility Overal bed mobility: Needs Assistance Bed Mobility: Supine to Sit     Supine to sit: Min assist;HOB elevated     General bed mobility comments: pt very painful and relied on HOB elevation to bring herself to sitting.  pt did ask to do as much as she is able on her own.    Transfers Overall transfer level: Needs assistance Equipment used: 2 person hand held assist Transfers: Sit to/from [facility] Sit to Stand: Min assist;+2 physical assistance Stand pivot transfers: Min assist;+2 physical assistance       General transfer comment: pt very painful with all movement.  pt does weightbear through LEs well.  pt remains with flexed posture due to pain.    Ambulation/Gait                Stairs            Wheelchair Mobility     Modified Rankin (Stroke Patients Only)       Balance Overall balance assessment: Needs assistance Sitting-balance support: Bilateral upper extremity supported;Feet supported Sitting balance-Leahy Scale: Fair Sitting balance - Comments: UE support to minimize pain.   Standing balance support: Bilateral upper extremity supported;During functional activity Standing balance-Leahy Scale: Poor                               Pertinent Vitals/Pain Pain Assessment: 0-10 Pain Score: 9  Pain Location: Chest and Abdomen worse than Bil knees.   Pain Descriptors / Indicators: Aching;Sore Pain Intervention(s): Monitored during session;Premedicated before session;Repositioned    Home Living Family/patient expects to be discharged to:: Private residence Living Arrangements: Spouse/significant other Available Help at Discharge: Family;Available PRN/intermittently Type of Home: House Home Access: Stairs to enter Entrance Stairs-Rails: Left;Right;Can reach both Entrance Stairs-Number of Steps: 8 Home Layout: One level Home Equipment: Cane - single point;Shower seat;Bedside commode      Prior Function Level of Independence: Independent               Hand Dominance        Extremity/Trunk Assessment   Upper Extremity Assessment: Defer to OT evaluation           Lower Extremity Assessment: Generalized weakness      Cervical /  Trunk Assessment: Kyphotic  Communication   Communication: No difficulties  Cognition Arousal/Alertness: Awake/alert Behavior During Therapy: Flat affect Overall Cognitive Status: Within Functional Limits for tasks assessed                      General Comments      Exercises        Assessment/Plan    PT Assessment Patient needs continued PT services  PT Diagnosis Difficulty walking;Generalized weakness;Acute pain   PT Problem List Decreased strength;Decreased activity tolerance;Decreased balance;Decreased  mobility;Decreased knowledge of use of DME;Pain  PT Treatment Interventions DME instruction;Gait training;Stair training;Functional mobility training;Therapeutic activities;Therapeutic exercise;Balance training;Neuromuscular re-education;Patient/family education   PT Goals (Current goals can be found in the Care Plan section) Acute Rehab PT Goals Patient Stated Goal: To stop falling PT Goal Formulation: With patient Time For Goal Achievement: 04/22/16 Potential to Achieve Goals: Good    Frequency Min 3X/week   Barriers to discharge        Co-evaluation               End of Session Equipment Utilized During Treatment:  (pt declined due to chest and abdomen pain.) Activity Tolerance: Patient limited by pain Patient left: in chair;with call bell/phone within reach;with chair alarm set Nurse Communication: Mobility status    Functional Assessment Tool Used: Clinical Judgement Functional Limitation: Mobility: Walking and moving around Mobility: Walking and Moving Around Current Status [phone]): At least 20 percent but less than 40 percent impaired, limited or restricted Mobility: Walking and Moving Around Goal Status [phone]): At least 1 percent but less than 20 percent impaired, limited or restricted    Time: 9528-4132 PT Time Calculation (min) (ACUTE ONLY): 26 min   Charges:   PT Evaluation $PT Eval Moderate Complexity: 1 Procedure PT Treatments $Therapeutic Activity: 8-22 mins   PT G Codes:   PT G-Codes **NOT FOR INPATIENT CLASS** Functional Assessment Tool Used: Clinical Judgement Functional Limitation: Mobility: Walking and moving around Mobility: Walking and Moving Around Current Status (G4010): At least 20 percent but less than 40 percent impaired, limited or restricted Mobility: Walking and Moving Around Goal Status [phone]): At least 1 percent but less than 20 percent impaired, limited or restricted    [name], [place] 04/08/2016, 11:39 AM

## 2016-04-08 NOTE — Discharge Instructions (Signed)
Atrial Fibrillation °Atrial fibrillation is a type of irregular or rapid heartbeat (arrhythmia). In atrial fibrillation, the heart quivers continuously in a chaotic pattern. This occurs when parts of the heart receive disorganized signals that make the heart unable to pump blood normally. This can increase the risk for stroke, heart failure, and other heart-related conditions. There are different types of atrial fibrillation, including: °· Paroxysmal atrial fibrillation. This type starts suddenly, and it usually stops on its own shortly after it starts. °· Persistent atrial fibrillation. This type often lasts longer than a week. It may stop on its own or with treatment. °· Long-lasting persistent atrial fibrillation. This type lasts longer than 12 months. °· Permanent atrial fibrillation. This type does not go away. °Talk with your health care provider to learn about the type of atrial fibrillation that you have. °CAUSES °This condition is caused by some heart-related conditions or procedures, including: °· A heart attack. °· Coronary artery disease. °· Heart failure. °· Heart valve conditions. °· High blood pressure. °· Inflammation of the sac that surrounds the heart (pericarditis). °· Heart surgery. °· Certain heart rhythm disorders, such as Wolf-Parkinson-White syndrome. °Other causes include: °· Pneumonia. °· Obstructive sleep apnea. °· Blockage of an artery in the lungs (pulmonary embolism, or PE). °· Lung cancer. °· Chronic lung disease. °· Thyroid problems, especially if the thyroid is overactive (hyperthyroidism). °· Caffeine. °· Excessive alcohol use or illegal drug use. °· Use of some medicines, including certain decongestants and diet pills. °Sometimes, the cause cannot be found. °RISK FACTORS °This condition is more likely to develop in: °· People who are older in age. °· People who smoke. °· People who have diabetes mellitus. °· People who are overweight (obese). °· Athletes who exercise  vigorously. °SYMPTOMS °Symptoms of this condition include: °· A feeling that your heart is beating rapidly or irregularly. °· A feeling of discomfort or pain in your chest. °· Shortness of breath. °· Sudden light-headedness or weakness. °· Getting tired easily during exercise. °In some cases, there are no symptoms. °DIAGNOSIS °Your health care provider may be able to detect atrial fibrillation when taking your pulse. If detected, this condition may be diagnosed with: °· An electrocardiogram (ECG). °· A Holter monitor test that records your heartbeat patterns over a 24-hour period. °· Transthoracic echocardiogram (TTE) to evaluate how blood flows through your heart. °· Transesophageal echocardiogram (TEE) to view more detailed images of your heart. °· A stress test. °· Imaging tests, such as a CT scan or chest X-ray. °· Blood tests. °TREATMENT °The main goals of treatment are to prevent blood clots from forming and to keep your heart beating at a normal rate and rhythm. The type of treatment that you receive depends on many factors, such as your underlying medical conditions and how you feel when you are experiencing atrial fibrillation. °This condition may be treated with: °· Medicine to slow down the heart rate, bring the heart's rhythm back to normal, or prevent clots from forming. °· Electrical cardioversion. This is a procedure that resets your heart's rhythm by delivering a controlled, low-energy shock to the heart through your skin. °· Different types of ablation, such as catheter ablation, catheter ablation with pacemaker, or surgical ablation. These procedures destroy the heart tissues that send abnormal signals. When the pacemaker is used, it is placed under your skin to help your heart beat in a regular rhythm. °HOME CARE INSTRUCTIONS °· Take over-the counter and prescription medicines only as told by your health care provider. °·   If your health care provider prescribed a blood-thinning medicine  (anticoagulant), take it exactly as told. Taking too much blood-thinning medicine can cause bleeding. If you do not take enough blood-thinning medicine, you will not have the protection that you need against stroke and other problems.  Do not use tobacco products, including cigarettes, chewing tobacco, and e-cigarettes. If you need help quitting, ask your health care provider.  If you have obstructive sleep apnea, manage your condition as told by your health care provider.  Do not drink alcohol.  Do not drink beverages that contain caffeine, such as coffee, soda, and tea.  Maintain a healthy weight. Do not use diet pills unless your health care provider approves. Diet pills may make heart problems worse.  Follow diet instructions as told by your health care provider.  Exercise regularly as told by your health care provider.  Keep all follow-up visits as told by your health care provider. This is important. PREVENTION  Avoid drinking beverages that contain caffeine or alcohol.  Avoid certain medicines, especially medicines that are used for breathing problems.  Avoid certain herbs and herbal medicines, such as those that contain ephedra or ginseng.  Do not use illegal drugs, such as cocaine and amphetamines.  Do not smoke.  Manage your high blood pressure. SEEK MEDICAL CARE IF:  You notice a change in the rate, rhythm, or strength of your heartbeat.  You are taking an anticoagulant and you notice increased bruising.  You tire more easily when you exercise or exert yourself. SEEK IMMEDIATE MEDICAL CARE IF:  You have chest pain, abdominal pain, sweating, or weakness.  You feel nauseous.  You notice blood in your vomit, bowel movement, or urine.  You have shortness of breath.  You suddenly have swollen feet and ankles.  You feel dizzy.  You have sudden weakness or numbness of the face, arm, or leg, especially on one side of the body.  You have trouble speaking,  trouble understanding, or both (aphasia).  Your face or your eyelid droops on one side. These symptoms may represent a serious problem that is an emergency. Do not wait to see if the symptoms will go away. Get medical help right away. Call your local emergency services (911 in the [place] Do not drive yourself to the hospital.   This information is not intended to replace advice given to you by your health care provider. Make sure you discuss any questions you have with your health care provider.   Document Released: 12/15/2005 Document Revised: 09/05/2015 Document Reviewed: 04/11/2015 Elsevier Interactive Patient Education 2016 [place] on my medicine - XARELTO (Rivaroxaban)  This medication education was reviewed with me or my healthcare representative as part of my discharge preparation.  The pharmacist that spoke with me during my hospital stay was:  Wayland Salinas, [facility]  Why was Xarelto prescribed for you? Xarelto was prescribed for you to reduce the risk of a blood clot forming that can cause a stroke if you have a medical condition called atrial fibrillation (a type of irregular heartbeat).  What do you need to know about xarelto ? Take your Xarelto ONCE DAILY at the same time every day with your evening meal. If you have difficulty swallowing the tablet whole, you may crush it and mix in applesauce just prior to taking your dose.  Take Xarelto exactly as prescribed by your doctor and DO NOT stop taking Xarelto without talking to the doctor who prescribed the medication.  Stopping without other stroke prevention medication to take the place of Xarelto may increase your risk of developing a clot that causes a stroke.  Refill your prescription before you run out.  After discharge, you should have regular check-up appointments with your healthcare provider that is prescribing your Xarelto.  In the future your dose may need to be changed if  your kidney function or weight changes by a significant amount.  What do you do if you miss a dose? If you are taking Xarelto ONCE DAILY and you miss a dose, take it as soon as you remember on the same day then continue your regularly scheduled once daily regimen the next day. Do not take two doses of Xarelto at the same time or on the same day.   Important Safety Information A possible side effect of Xarelto is bleeding. You should call your healthcare provider right away if you experience any of the following: ? Bleeding from an injury or your nose that does not stop. ? Unusual colored urine (red or dark brown) or unusual colored stools (red or black). ? Unusual bruising for unknown reasons. ? A serious fall or if you hit your head (even if there is no bleeding).  Some medicines may interact with Xarelto and might increase your risk of bleeding while on Xarelto. To help avoid this, consult your healthcare provider or pharmacist prior to using any new prescription or non-prescription medications, including herbals, vitamins, non-steroidal anti-inflammatory drugs (NSAIDs) and supplements.  This website has more information on Xarelto: [email or web address]

## 2016-04-08 NOTE — Consult Note (Signed)
CARDIOLOGY CONSULT NOTE  Patient ID: [name] MRN: [id number] DOB/AGE: [birth year] 82 y.o.  Admit date: 04/07/2016 Referring Physician: Internal Medicine  Primary Physician:  Dwan Bolt, MD Reason for Consultation: syncope  HPI: [name]  is a 82 y.o. female with a history of rare form of low-grade lung carcinoid (followed at [facility]), permanent a.fib on Xarelto, hyperlipidemia, HTN, DM, hypothyroidism, and non critical CAD by cath on 04/20/2015.  She presented on 04/02/2016 for evaluation of syncope. She was standing in her kitchen when she became lightheaded. She sat down and symptoms began to improve so she stood back up again and lightheadedness returned then she woke up on the floor. She denied any chest pain or palpitations but had reported fatigue and shortness of breath earlier in the day. She was discharged home on 04/04/2016 and was scheduled to pick up an event monitor from our office on 04/07/2016 to evaluate for arrhythmogenic etiology of syncope, although no significant arrhythmias or heart block was noted during hospitalization. She, however, again presented to the ED on 04/07/2016 with multiple recurrent falls and syncopal episodes. She was noted to be in a.fib with RVR with rates in the 120s on arrival which improved with IV bolus of cardizem.  Patient has chronic shortness of breath and dyspnea on exertion, has very slow-growing carcinoid tumor. Patient has gradually noticed deterioration in general health. She also complains of frequent loose stools, was evaluated by Dr. Michail Sermon who told her that is diverticulosis. Patient has had multiple episodes of feeling lightheaded and weak along with shortness of breath followed by syncope. Episodes always occur when standing, usually after she has just stood up and walked to another room.   During previous hospitalization, chest CT was performed which was negative for DVT. No acute abnormalities noted on head CT.  Echocardiogram revealed normal LVEF with no significant changes. No significant arrhythmias, aside from chronic a.fib, or heart block noted on telemetry.  Past Medical History  Diagnosis Date  . Lung cancer ([place])   . Stroke ([place])   . Diabetes mellitus   . CAD (coronary artery disease) 03/13/2012  . Type II or unspecified type diabetes mellitus without mention of complication, not stated as uncontrolled 03/13/2012  . Hypothyroid 03/13/2012  . HTN (hypertension) 03/13/2012  . Dysrhythmia     ATRIAL FIB  . Shortness of breath   . Syncope and collapse 04/02/2016     Past Surgical History  Procedure Laterality Date  . Cardioversion  04/20/2012    Procedure: CARDIOVERSION;  Surgeon: Laverda Page, MD;  Location: [place];  Service: Cardiovascular;  Laterality: N/A;  . Abdominal hysterectomy    . Tumors      back of head  . Hemorrhoid surgery    . Jaw replacement    . Cholecystectomy    . Breast surgery      benign tumors removed in 1980  . Cardiac catheterization    . Cardioversion N/A 05/10/2013    Procedure: CARDIOVERSION;  Surgeon: Laverda Page, MD;  Location: [place];  Service: Cardiovascular;  Laterality: N/A;  . Left heart catheterization with coronary angiogram N/A 04/20/2015    Procedure: LEFT HEART CATHETERIZATION WITH CORONARY ANGIOGRAM;  Surgeon: Adrian Prows, MD;  Location: [facility] CATH LAB;  Service: Cardiovascular;  Laterality: N/A;     Family History  Problem Relation Age of Onset  . Breast cancer Mother   . Aneurysm Father     Likely thoracic aneurysm per patient  . Melanoma Brother   .  Heart disease Sister   . Diabetes type II Sister      Social History: Social History   Social History  . Marital Status: Married    Spouse Name: N/A  . Number of Children: N/A  . Years of Education: N/A   Occupational History  . Not on file.   Social History Main Topics  . Smoking status: Never Smoker   . Smokeless tobacco: Never Used  . Alcohol Use: No  . Drug Use:  No  . Sexual Activity: Not on file   Other Topics Concern  . Not on file   Social History Narrative     Prescriptions prior to admission  Medication Sig Dispense Refill Last Dose  . acetaminophen (TYLENOL) 500 MG tablet Take 500 mg by mouth every 6 (six) hours as needed for moderate pain or headache.    Past Week at Unknown time  . atorvastatin (LIPITOR) 40 MG tablet Take 1 tablet (40 mg total) by mouth daily. 30 tablet 0 Past Week at Unknown time  . Calcium Carb-Cholecalciferol (CALCIUM-VITAMIN D3) 600-500 MG-UNIT CAPS Take 1 capsule by mouth daily.   Past Week at Unknown time  . clobetasol (TEMOVATE) 0.05 % external solution Apply 1 application topically 2 (two) times daily.   Past Week at Unknown time  . clobetasol cream (TEMOVATE) 2.84 % Apply 1 application topically 2 (two) times daily.   Past Week at Unknown time  . colesevelam (WELCHOL) 625 MG tablet Take 625 mg by mouth daily.    Past Week at Unknown time  . diltiazem (CARDIZEM CD) 180 MG 24 hr capsule Take 1 capsule (180 mg total) by mouth daily. 30 capsule 0 Past Week at Unknown time  . hydrOXYzine (VISTARIL) 25 MG capsule Take 25 mg by mouth at bedtime.   Past Week at Unknown time  . isosorbide mononitrate (IMDUR) 60 MG 24 hr tablet Take 1 tablet (60 mg total) by mouth daily. 30 tablet 0 Past Week at Unknown time  . levothyroxine (SYNTHROID, LEVOTHROID) 150 MCG tablet Take 150 mcg by mouth daily.   Past Week at Unknown time  . Mag Oxide-Vit D3-Turmeric (MAGNESIUM-VITAMIN D3-TURMERIC PO) Take 1 tablet by mouth daily.   Past Week at Unknown time  . meclizine (ANTIVERT) 25 MG tablet Take 25 mg by mouth daily as needed for dizziness.    Past Week at Unknown time  . Multiple Vitamins-Minerals (WOMENS BONE HEALTH PO) Take 1 tablet by mouth daily.   Past Week at Unknown time  . Polyethyl Glycol-Propyl Glycol (SYSTANE) 0.4-0.3 % SOLN Apply 2 drops to eye 2 (two) times daily as needed (dry eyes).   Past Week at Unknown time  . polyvinyl  alcohol (LIQUIFILM TEARS) 1.4 % ophthalmic solution Place 2 drops into both eyes as needed for dry eyes.    Past Week at Unknown time  . Rivaroxaban (XARELTO) 15 MG TABS tablet Take 15 mg by mouth daily with supper.   04/06/2016 at Unknown time  . sotalol (BETAPACE) 80 MG tablet Take 80 mg by mouth 2 (two) times daily.   Past Week at 2000  . traMADol (ULTRAM) 50 MG tablet Take 1 tablet (50 mg total) by mouth every 6 (six) hours as needed. 15 tablet 0 Past Week at Unknown time  . triamcinolone (KENALOG) 0.025 % ointment Apply 1 application topically 2 (two) times daily. Do not apply to face 80 g 1 Past Week at Unknown time   ROS: General: no fevers/chills/night sweats Eyes: no blurry vision, diplopia,  or amaurosis ENT: no sore throat or hearing loss Resp: no cough, wheezing, or hemoptysis. Has chronic shortness of breath and dyspnea on exertion which has gradually been worsening, uses nocturnal oxygen. CV: no chest pain, edema, or palpitations GI: No dark stools or bloody stools. Has chronic diarrhea. No abdominal pain, nausea, vomiting, or constipation GU: no dysuria, frequency, or hematuria Neuro: Reports one syncopal episode, preceded by lightheadedness, shortness of breath; no headache, numbness, tingling, or weakness of extremities Musculoskeletal: no joint pain or swelling Heme: no bleeding, DVT, or easy bruising Endo: no polydipsia or polyuria   Physical Exam: Blood pressure 140/69, pulse 97, temperature 97.4 F (36.3 C), temperature source Oral, resp. rate 20, height '5\' 3"'$  (1.6 m), weight 55.475 kg (122 lb 4.8 oz), SpO2 98 %.   General appearance: alert, cooperative, appears stated age and no distress Lungs: clear to auscultation bilaterally Heart: Distant heart sounds. S1 is variable, S2 is normal, 2/6 systolic ejection murmur at the apex. No gallop. No pericardial rub. Abdomen: soft, non-tender; bowel sounds normal; no masses,  no organomegaly Extremities: extremities normal,  atraumatic, no cyanosis or edema Pulses: 2+ and symmetric Neurologic: Grossly normal  Skin: Ecchymosis to face from previous fall  Labs:   Lab Results  Component Value Date   WBC 4.8 04/08/2016   HGB 9.2* 04/08/2016   HCT 27.9* 04/08/2016   MCV 84.8 04/08/2016   PLT 121* 04/08/2016     Recent Labs Lab 04/07/16 1620 04/08/16 0409  NA  --  141  K  --  3.8  CL  --  109  CO2  --  22  BUN  --  10  CREATININE  --  0.81  CALCIUM  --  8.5*  PROT 5.8*  --   BILITOT 1.1  --   ALKPHOS 63  --   ALT 12*  --   AST 14*  --   GLUCOSE  --  178*    BNP (last 3 results)  Recent Labs  04/17/15 1459 04/03/16 0038  BNP 302.0* 204.8*    HEMOGLOBIN A1C Lab Results  Component Value Date   HGBA1C 7.9* 04/07/2016   MPG 180 04/07/2016    Cardiac Panel (last 3 results)  Recent Labs  04/07/16 1822 04/08/16 0409  TROPONINI 0.14* 0.10*    Lab Results  Component Value Date   CKTOTAL 66 03/14/2012   CKMB 2.0 03/14/2012   TROPONINI 0.10* 04/08/2016    Telemetry 04/08/2016: Afib with V rate of 100-110 bpm. No heart block or other arrhythmia noted.  EKG 04/07/2016: afib with RVR at a rate of 127bom, normal axis, diffuse nonspecific ST and T wave abnormality, PRWP, low voltage complexes. No significant change from prior EKG aside from increased rate.  Echo 04/03/2016:  - Left ventricle: The cavity size was normal. Systolic function was normal. The estimated ejection fraction was in the range of 60-65%. The study is not technically sufficient to allow evaluation of LV diastolic function. - Aortic valve: Mildly calcified annulus. Trileaflet; mildly calcified leaflets. There was mild stenosis. There was mild regurgitation. Valve area (VTI): 0.74 cm^2. Valve area (Vmax):0.63 cm^2. Valve area (Vmean): 0.63 cm^2. - Mitral valve: Calcified annulus. There was mild regurgitation. - Left atrium: The atrium was moderately dilated. - Right ventricle: The cavity size was mildly dilated. Systolic  function was mildly reduced. - Right atrium: The atrium was mildly dilated. - Atrial septum: No defect or patent foramen ovale was identified. - Tricuspid valve: There was moderate regurgitation. - Pulmonary  arteries: PA peak pressure: 47 mm Hg (S).   Radiology: Dg Chest Portable 1 View  04/07/2016  CLINICAL DATA:  Left-sided chest pain. Multiple recent falls including 1 fall yesterday. Left lower rib pain. EXAM: PORTABLE CHEST 1 VIEW COMPARISON:  Chest CT dated 04/03/2016 and chest x-rays dated 04/02/2016 and 12/19/2015 FINDINGS: There are multiple old healed left posterior lateral rib fractures. Diffuse osteopenia. Chronic borderline cardiomegaly. Pulmonary vascularity is normal and the lungs are clear although somewhat hyperinflated suggesting emphysema. Calcification in the thoracic aorta. No acute bone abnormality. IMPRESSION: No acute abnormality. Borderline cardiomegaly. Emphysema. Multiple old left rib fractures. Osteopenia. Electronically Signed   By: Lorriane Shire M.D.   On: 04/07/2016 15:02    Scheduled Meds: . atorvastatin  40 mg Oral Daily  . calcium-vitamin D  1 tablet Oral Q breakfast  . clobetasol cream  1 application Topical BID  . colesevelam  625 mg Oral Daily  . diltiazem  180 mg Oral Daily  . hydrOXYzine  25 mg Oral QHS  . insulin aspart  0-15 Units Subcutaneous TID WC  . insulin aspart  0-5 Units Subcutaneous QHS  . isosorbide mononitrate  60 mg Oral Daily  . levothyroxine  150 mcg Oral Daily  . potassium chloride  40 mEq Oral BID  . Rivaroxaban  15 mg Oral Q supper  . sodium chloride flush  3 mL Intravenous Q12H  . sotalol  80 mg Oral BID  . triamcinolone  1 application Topical BID   Continuous Infusions:  PRN Meds:.acetaminophen **OR** acetaminophen, meclizine, ondansetron **OR** ondansetron (ZOFRAN) IV, polyethylene glycol, polyvinyl alcohol, traMADol  ASSESSMENT AND PLAN:  1. Syncope 2. Chronic a.fib (CHA2DS2-VASc Score is 6 with yearly risk of stroke of  9.8%.  HAS-Bled score is 2 and estimated major bleeding in one year is 1.88-3.2%); on Xarelto for anticoagulation 3. Non critical CAD by cath on 04/20/2015 (D1 and D2 have high-grade stenosis, very small and unchanged from 2007.  Mid LAD 50% stenosis unchanged from 2007) 4. Essential Hypertension 5. HLD 6. Uncontrolled Type II Diabetes Mellitus 7. Bilateral Fibromuscular dysplasia of renal arteries (incidental finding during cardiac cath Sept 2007) 8. Carcinoid bronchial adenoma 9. Anemia and chronic thrombocytopenia, stable.   Recommendation: Symptoms again suggestive of vasovagal syncope as they always occur when standing, preceded by lightheadedness and weakness and have occasionally been prevented by sitting or laying down.  Still do not suspect malignant arrhythmic episode, although she has not experienced an episode while on telemetry. No heart block on telemetry. Patient has difficulty in controlling atrial fibrillation rate, amiodarone felt not to be appropriate due to her lung condition hence on chronic sotalol and rate is well controlled.  She is also noticed gradual worsening in overall general health including worsening dyspnea. Fortunately her lung nodules by CT scan have remained stable. Again recommend an event monitor for 30 days at discharge.   Neldon Labella, NP-C  04/08/2016, 8:33 AM [facility] [place] Pager: [phone] Office: [phone]

## 2016-04-08 NOTE — Progress Notes (Signed)
[facility]  Patient Status: Active (receiving services up to time of hospitalization)  [facility] is providing the following services: PT  If patient discharges after hours, please call [phone].   [name] 04/08/2016, 5:40 PM

## 2016-04-09 DIAGNOSIS — I482 Chronic atrial fibrillation: Secondary | ICD-10-CM | POA: Diagnosis not present

## 2016-04-09 DIAGNOSIS — I10 Essential (primary) hypertension: Secondary | ICD-10-CM | POA: Diagnosis not present

## 2016-04-09 DIAGNOSIS — Z7389 Other problems related to life management difficulty: Secondary | ICD-10-CM | POA: Diagnosis not present

## 2016-04-09 DIAGNOSIS — E1122 Type 2 diabetes mellitus with diabetic chronic kidney disease: Secondary | ICD-10-CM | POA: Diagnosis not present

## 2016-04-09 DIAGNOSIS — E118 Type 2 diabetes mellitus with unspecified complications: Secondary | ICD-10-CM | POA: Diagnosis not present

## 2016-04-09 DIAGNOSIS — R627 Adult failure to thrive: Secondary | ICD-10-CM | POA: Diagnosis not present

## 2016-04-09 DIAGNOSIS — E46 Unspecified protein-calorie malnutrition: Secondary | ICD-10-CM | POA: Diagnosis not present

## 2016-04-09 DIAGNOSIS — I25119 Atherosclerotic heart disease of native coronary artery with unspecified angina pectoris: Secondary | ICD-10-CM | POA: Diagnosis not present

## 2016-04-09 DIAGNOSIS — Z6822 Body mass index (BMI) 22.0-22.9, adult: Secondary | ICD-10-CM | POA: Diagnosis not present

## 2016-04-09 DIAGNOSIS — R55 Syncope and collapse: Secondary | ICD-10-CM | POA: Diagnosis not present

## 2016-04-09 DIAGNOSIS — C7A090 Malignant carcinoid tumor of the bronchus and lung: Secondary | ICD-10-CM | POA: Diagnosis not present

## 2016-04-09 DIAGNOSIS — Z8673 Personal history of transient ischemic attack (TIA), and cerebral infarction without residual deficits: Secondary | ICD-10-CM | POA: Diagnosis not present

## 2016-04-09 DIAGNOSIS — Z9181 History of falling: Secondary | ICD-10-CM | POA: Diagnosis not present

## 2016-04-09 DIAGNOSIS — E785 Hyperlipidemia, unspecified: Secondary | ICD-10-CM | POA: Diagnosis not present

## 2016-04-09 DIAGNOSIS — I480 Paroxysmal atrial fibrillation: Secondary | ICD-10-CM | POA: Diagnosis not present

## 2016-04-09 DIAGNOSIS — I2510 Atherosclerotic heart disease of native coronary artery without angina pectoris: Secondary | ICD-10-CM | POA: Diagnosis not present

## 2016-04-09 DIAGNOSIS — R748 Abnormal levels of other serum enzymes: Secondary | ICD-10-CM | POA: Diagnosis not present

## 2016-04-09 DIAGNOSIS — E1129 Type 2 diabetes mellitus with other diabetic kidney complication: Secondary | ICD-10-CM | POA: Diagnosis not present

## 2016-04-09 DIAGNOSIS — Z79899 Other long term (current) drug therapy: Secondary | ICD-10-CM | POA: Diagnosis not present

## 2016-04-09 DIAGNOSIS — M25532 Pain in left wrist: Secondary | ICD-10-CM | POA: Diagnosis not present

## 2016-04-09 DIAGNOSIS — R296 Repeated falls: Secondary | ICD-10-CM | POA: Diagnosis not present

## 2016-04-09 DIAGNOSIS — E1165 Type 2 diabetes mellitus with hyperglycemia: Secondary | ICD-10-CM | POA: Diagnosis not present

## 2016-04-09 DIAGNOSIS — I4891 Unspecified atrial fibrillation: Secondary | ICD-10-CM | POA: Diagnosis not present

## 2016-04-09 DIAGNOSIS — E039 Hypothyroidism, unspecified: Secondary | ICD-10-CM | POA: Diagnosis not present

## 2016-04-09 DIAGNOSIS — Z7901 Long term (current) use of anticoagulants: Secondary | ICD-10-CM | POA: Diagnosis not present

## 2016-04-09 DIAGNOSIS — R259 Unspecified abnormal involuntary movements: Secondary | ICD-10-CM | POA: Diagnosis not present

## 2016-04-09 DIAGNOSIS — R0781 Pleurodynia: Secondary | ICD-10-CM | POA: Diagnosis not present

## 2016-04-09 DIAGNOSIS — D696 Thrombocytopenia, unspecified: Secondary | ICD-10-CM | POA: Diagnosis not present

## 2016-04-09 DIAGNOSIS — D649 Anemia, unspecified: Secondary | ICD-10-CM | POA: Diagnosis not present

## 2016-04-09 DIAGNOSIS — R0602 Shortness of breath: Secondary | ICD-10-CM | POA: Diagnosis not present

## 2016-04-09 DIAGNOSIS — M6281 Muscle weakness (generalized): Secondary | ICD-10-CM | POA: Diagnosis not present

## 2016-04-09 DIAGNOSIS — N289 Disorder of kidney and ureter, unspecified: Secondary | ICD-10-CM | POA: Diagnosis not present

## 2016-04-09 DIAGNOSIS — R532 Functional quadriplegia: Secondary | ICD-10-CM | POA: Diagnosis not present

## 2016-04-09 DIAGNOSIS — E876 Hypokalemia: Secondary | ICD-10-CM | POA: Diagnosis not present

## 2016-04-09 LAB — COMPREHENSIVE METABOLIC PANEL
ALBUMIN: 3 g/dL — AB (ref 3.5–5.0)
ALK PHOS: 70 U/L (ref 38–126)
ALT: 11 U/L — ABNORMAL LOW (ref 14–54)
ANION GAP: 10 (ref 5–15)
AST: 15 U/L (ref 15–41)
BILIRUBIN TOTAL: 0.7 mg/dL (ref 0.3–1.2)
BUN: 15 mg/dL (ref 6–20)
CALCIUM: 8.9 mg/dL (ref 8.9–10.3)
CO2: 22 mmol/L (ref 22–32)
Chloride: 107 mmol/L (ref 101–111)
Creatinine, Ser: 0.98 mg/dL (ref 0.44–1.00)
GFR, EST NON AFRICAN AMERICAN: 53 mL/min — AB (ref >60)
GLUCOSE: 209 mg/dL — AB (ref 65–99)
POTASSIUM: 5 mmol/L (ref 3.5–5.1)
Sodium: 139 mmol/L (ref 135–145)
TOTAL PROTEIN: 5.8 g/dL — AB (ref 6.5–8.1)

## 2016-04-09 LAB — GLUCOSE, CAPILLARY
GLUCOSE-CAPILLARY: 182 mg/dL — AB (ref 65–99)
Glucose-Capillary: 195 mg/dL — ABNORMAL HIGH (ref 65–99)

## 2016-04-09 LAB — MAGNESIUM: Magnesium: 1.2 mg/dL — ABNORMAL LOW (ref 1.7–2.4)

## 2016-04-09 LAB — T3, FREE: T3 FREE: 1.7 pg/mL — AB (ref 2.0–4.4)

## 2016-04-09 LAB — PROTIME-INR
INR: 2.24 — AB (ref 0.00–1.49)
PROTHROMBIN TIME: 24.6 s — AB (ref 11.6–15.2)

## 2016-04-09 MED ORDER — LEVOTHYROXINE SODIUM 100 MCG PO TABS: 100.0000 ug | ORAL_TABLET | Freq: Every day | ORAL | Status: DC

## 2016-04-09 MED ORDER — SOTALOL HCL 120 MG PO TABS: 120.0000 mg | ORAL_TABLET | Freq: Two times a day (BID) | ORAL | Status: DC

## 2016-04-09 MED ORDER — MAGNESIUM OXIDE 400 (241.3 MG) MG PO TABS
400.0000 mg | ORAL_TABLET | Freq: Two times a day (BID) | ORAL | Status: DC
  Administered 2016-04-09: 400 mg via ORAL
  Filled 2016-04-09: qty 1

## 2016-04-09 MED ORDER — MAGNESIUM OXIDE 400 (241.3 MG) MG PO TABS: 400.0000 mg | ORAL_TABLET | Freq: Two times a day (BID) | ORAL | Status: DC

## 2016-04-09 MED ORDER — KETOROLAC TROMETHAMINE 30 MG/ML IJ SOLN
15.0000 mg | Freq: Once | INTRAMUSCULAR | Status: AC
  Administered 2016-04-09: 15 mg via INTRAVENOUS
  Filled 2016-04-09: qty 1

## 2016-04-09 NOTE — Progress Notes (Signed)
Pt. discharged to [facility] via [place]. Report given to receiving nurse. IV and tele removed. MD made aware holter monitor not able to be picked up till tomorrow. Instructed pt on not to make sudden moves take her time when changing positions.

## 2016-04-09 NOTE — Discharge Summary (Addendum)
Physician Discharge Summary  [name] RCV:[id number] DOB: [birth year] DOA: 04/07/2016  PCP: Dwan Bolt, MD  Admit date: 04/07/2016 Discharge date: 04/09/2016  Time spent: 35 minutes  Recommendations for Outpatient Follow-up:  1. Recommend continuous titration of rate controlling agents-patient is on sotalol 120 twice a day which is an increase from 80 twice a day from admission and is also on Cardizem. Would recommend magnesium level checked within 3-4 days in addition to complete metabolic panel. Further titration of medications and adjustment to be performed by Dr. Einar Gip of cardiology 2. Patient is to present herself to obtain event monitor prior to discharge-to be arranged by cardiology 3. Please enforce the use of incentive spirometer for patient because of falls and rib injury. 4. -Please teach the patient to stand and habituated to prior to ambulation at the nursing facility-she gives a history of feeling dizzy when she stands up initially which may be concordant with postural/orthostatic signs and symptoms given the fact that she is also on rate controlling agents which can cause this. 5. -Continue blood thinner as per prior discussions 6. -Please recheck TFTs in 3 weeks given the fact that her TSH and free T4 were deranged during hospitalization-I drop the dose of Synthroid from 150-100 g-this may been the primary etiology for patient's atrial fibrillation. Recommend close monitoring of same   Discharge Diagnoses:  Principal Problem:   Syncope and collapse Active Problems:   Atrial fibrillation (HCC) CHA2DS2-VASc Score 7   HTN (hypertension)   Hypothyroid   DM (diabetes mellitus), type 2, uncontrolled, with renal complications (HCC)   CAD (coronary artery disease)   Hypokalemia   Syncope   Thrombocytopenia (HCC)   Anemia   Atrial fibrillation with RVR ([place])   Diabetes mellitus with complication ([place])   Faintness   Discharge Condition: Improved  Diet  recommendation: Heart healthy low-salt  Filed Weights   04/07/16 1817 04/08/16 0527 04/09/16 0401  Weight: 55.203 kg (121 lb 11.2 oz) 55.475 kg (122 lb 4.8 oz) 56.473 kg (124 lb 8 oz)    History of present illness:  82 year female with recent admission to hospital and discharge for cryptogenic syncope-represented to the hospital/10/17 with similar episode where in patient fell, hit her face, hit her chest Cardiology reconsulted   Hospital Course:  Syncope and collapse: Unclear etiology. No warning signs. No lightheadedness. No significant drop in hemoglobin. This does not sound neurologic, no description of vertigo-like symptoms. Have reconsulted her cardiologist. Continue on telemetry-review of telemetry revealed only 1.5 second pauses and atrial fibrillation which was rate controlled overall during hospital stay. Cardiology recommended that patient continue the above doses of sotalol, Cardizem and recommended outpatient event monitor to be arranged It was noted also that the patient said to me on day of discharge that when she stands initially she feels dizzy so there may be some benefit getting orthostatic vital signs at the facility that she is discharged to-she will be discharged to a SNIF for safety given her husband is unable to care for her and is frail himself Active Problems:   Atrial fibrillation (HCC) CHA2DS2-VASc Score 7: Currently rate controlled, on anticoagulation-benefits outweigh risks in terms of control of heart rate control and anticoagulation given high risk for stroke with [place] score of 6 or 7    HTN (hypertension)-continue meds   Hypothyroid: Continue Synthroid at a lower dose of 100 g given elevated T4 and depressed TSH this admission-as above will need TSH checked within 3week   DM (diabetes mellitus),  type 2, uncontrolled, with renal complications [facility]): Blood sugars this morning under 195-235   CAD (coronary artery disease)-no indication for dual antiplatelet,  anticoagulant in this specific case    Hypokalemia-recheck magnesium prior to discharge given use of sotalol    Thrombocytopenia (HCC)-PLT 107-121   Anemia-stable currently   Procedures:    Consultations:  Dr. Einar Gip, cardiology cardiology  Discharge Exam: Filed Vitals:   04/09/16 0100 04/09/16 0401  BP: 107/56 113/76  Pulse: 97 86  Temp: 97.7 F (36.5 C) 98.1 F (36.7 C)  Resp: 20 20    States that the pain in her chest is 10/10-given reference of childbirth and says that this is worth Face has ecchymosis around the lower right chin and upper lip Chest clinically clear No JVD No bruit S1-S2 no murmur rub or gallop No lower extremity edema   Discharge Instructions    Current Discharge Medication List    START taking these medications   Details  magnesium oxide (MAG-OX) 400 (241.3 Mg) MG tablet Take 1 tablet (400 mg total) by mouth 2 (two) times daily. Qty: 30 tablet, Refills: 0      CONTINUE these medications which have CHANGED   Details  levothyroxine (SYNTHROID, LEVOTHROID) 100 MCG tablet Take 1 tablet (100 mcg total) by mouth daily. Qty: 30 tablet, Refills: 0    sotalol (BETAPACE) 120 MG tablet Take 1 tablet (120 mg total) by mouth 2 (two) times daily. Qty: 60 tablet, Refills: 0      CONTINUE these medications which have NOT CHANGED   Details  acetaminophen (TYLENOL) 500 MG tablet Take 500 mg by mouth every 6 (six) hours as needed for moderate pain or headache.     atorvastatin (LIPITOR) 40 MG tablet Take 1 tablet (40 mg total) by mouth daily. Qty: 30 tablet, Refills: 0    Calcium Carb-Cholecalciferol (CALCIUM-VITAMIN D3) 600-500 MG-UNIT CAPS Take 1 capsule by mouth daily.    clobetasol (TEMOVATE) 0.05 % external solution Apply 1 application topically 2 (two) times daily.    clobetasol cream (TEMOVATE) 0.86 % Apply 1 application topically 2 (two) times daily.    colesevelam (WELCHOL) 625 MG tablet Take 625 mg by mouth daily.     diltiazem  (CARDIZEM CD) 180 MG 24 hr capsule Take 1 capsule (180 mg total) by mouth daily. Qty: 30 capsule, Refills: 0    hydrOXYzine (VISTARIL) 25 MG capsule Take 25 mg by mouth at bedtime.    isosorbide mononitrate (IMDUR) 60 MG 24 hr tablet Take 1 tablet (60 mg total) by mouth daily. Qty: 30 tablet, Refills: 0    Mag Oxide-Vit D3-Turmeric (MAGNESIUM-VITAMIN D3-TURMERIC PO) Take 1 tablet by mouth daily.    meclizine (ANTIVERT) 25 MG tablet Take 25 mg by mouth daily as needed for dizziness.     Multiple Vitamins-Minerals (WOMENS BONE HEALTH PO) Take 1 tablet by mouth daily.    Polyethyl Glycol-Propyl Glycol (SYSTANE) 0.4-0.3 % SOLN Apply 2 drops to eye 2 (two) times daily as needed (dry eyes).    polyvinyl alcohol (LIQUIFILM TEARS) 1.4 % ophthalmic solution Place 2 drops into both eyes as needed for dry eyes.     Rivaroxaban (XARELTO) 15 MG TABS tablet Take 15 mg by mouth daily with supper.    traMADol (ULTRAM) 50 MG tablet Take 1 tablet (50 mg total) by mouth every 6 (six) hours as needed. Qty: 15 tablet, Refills: 0    triamcinolone (KENALOG) 0.025 % ointment Apply 1 application topically 2 (two) times daily. Do not apply  to face Qty: 80 g, Refills: 1       Allergies  Allergen Reactions  . Codeine Nausea And Vomiting  . Hydrocodone-Acetaminophen Nausea And Vomiting      The results of significant diagnostics from this hospitalization (including imaging, microbiology, ancillary and laboratory) are listed below for reference.    Significant Diagnostic Studies: Dg Chest 2 View  04/02/2016  CLINICAL DATA:  Shortness of breath and syncope. EXAM: CHEST - 2 VIEW COMPARISON:  12/19/2015 FINDINGS: The heart size and mediastinal contours are within normal limits. Stable probable chronic lung disease with suggestion of mild bilateral pulmonary hyperinflation. There is no evidence of pulmonary edema, consolidation, pneumothorax, nodule or pleural fluid. The thoracic spine shows osteopenia and  spondylosis without visible fracture. IMPRESSION: No active disease.  Chronic lung disease.  No acute findings. Electronically Signed   By: Aletta Edouard M.D.   On: 04/02/2016 18:17   Dg Wrist Complete Left  03/15/2016  CLINICAL DATA:  New lifting injury of the wrist with a popping sensation 6 days ago. Continued lateral wrist pain EXAM: LEFT WRIST - COMPLETE 3+ VIEW COMPARISON:  02/14/2011 FINDINGS: Deformity from prior fifth metacarpal shaft fracture. New linear calcification in the expected location of the TFCC disc favoring chondrocalcinosis. Mild spurring of the distal pole of the scaphoid. Chondral thinning in the carpus leads to mild carpal crowding. There is some ridging of the midportion and distal pole of the scaphoid. Upper normal with of the scapholunate distance. IMPRESSION: 1. I do not see an acute fracture. 2. Spurring in the scaphoid. 3. There is new chondrocalcinosis of the TFCC, raising the possibility of CPPD arthropathy. 4. If pain persists despite conservative therapy, MRI may be warranted for further characterization. Electronically Signed   By: Van Clines M.D.   On: 03/15/2016 08:19   Ct Head Wo Contrast  04/02/2016  CLINICAL DATA:  Recent fall with facial injury EXAM: CT HEAD WITHOUT CONTRAST CT MAXILLOFACIAL WITHOUT CONTRAST TECHNIQUE: Multidetector CT imaging of the head and maxillofacial structures were performed using the standard protocol without intravenous contrast. Multiplanar CT image reconstructions of the maxillofacial structures were also generated. COMPARISON:  10/02/2012 FINDINGS: CT HEAD FINDINGS The bony calvarium is intact. Mild atrophic changes are noted commenced with the patient's given age. No findings to suggest acute hemorrhage, acute infarction or space-occupying mass lesion are noted. CT MAXILLOFACIAL FINDINGS Bony structures of the face show no acute fracture. Prior surgical changes are noted posterior to the right zygomatic arch. The orbits and their  contents are within normal limits. The paranasal sinuses are unremarkable. Soft tissue swelling is noted below the nose centrally and eccentric to the right consistent with the recent injury. Degenerative changes of the temporomandibular joints are seen. No blowout fracture is identified. The visualized cervical spine shows degenerative change. No acute for abnormality is noted. IMPRESSION: CT of the head:  No acute intracranial abnormality noted. CT of the maxillofacial bones: No acute bony abnormality is noted. Soft tissue swelling over the right upper lip is seen consistent with the recent injury. Electronically Signed   By: Inez Catalina M.D.   On: 04/02/2016 19:11   Ct Angio Chest Pe W/cm &/or Wo Cm  04/03/2016  CLINICAL DATA:  Syncope and shortness of breath.  Elevated D-dimer. EXAM: CT ANGIOGRAPHY CHEST WITH CONTRAST TECHNIQUE: Multidetector CT imaging of the chest was performed using the standard protocol during bolus administration of intravenous contrast. Multiplanar CT image reconstructions and MIPs were obtained to evaluate the vascular anatomy. CONTRAST:  100 mL Isovue 370 IV COMPARISON:  Chest radiograph yesterday. Chest CT 04/18/2015, 03/13/2012 FINDINGS: Mediastinum/Lymph Nodes: No pulmonary emboli. Tortuous atherosclerotic aorta without aneurysm. Cannot assess for dissection given phase of contrast. No masses or pathologically enlarged lymph nodes identified. No pericardial effusion. Coronary artery calcifications. Lungs/Pleura: Mild emphysema. No consolidation. Multiple pulmonary nodules. Right middle lobe pulmonary nodule measures 1.4 x 1.1 cm image 62 series 6 unchanged adjacent 9 mm right middle lobe nodule image 58. Dominant right lower lobe nodule measures 1.7 x 1.6 cm image 62. Additional smaller right lower lobe pulmonary nodules are stable. Multiple small left lower lobe pulmonary nodules are unchanged. No new nodule. No pleural effusion. Upper abdomen: No acute findings. Musculoskeletal:  No chest wall mass or suspicious bone lesions identified. Prominent Schmorl's node lower thoracic vertebra. Review of the MIP images confirms the above findings. IMPRESSION: 1. No pulmonary embolus. 2. No acute process. 3. Multiple bilateral pulmonary nodules, unchanged dating back to 2013 and considered benign. 4. Atherosclerosis and coronary artery calcifications. Electronically Signed   By: Jeb Levering M.D.   On: 04/03/2016 02:13   Dg Chest Portable 1 View  04/07/2016  CLINICAL DATA:  Left-sided chest pain. Multiple recent falls including 1 fall yesterday. Left lower rib pain. EXAM: PORTABLE CHEST 1 VIEW COMPARISON:  Chest CT dated 04/03/2016 and chest x-rays dated 04/02/2016 and 12/19/2015 FINDINGS: There are multiple old healed left posterior lateral rib fractures. Diffuse osteopenia. Chronic borderline cardiomegaly. Pulmonary vascularity is normal and the lungs are clear although somewhat hyperinflated suggesting emphysema. Calcification in the thoracic aorta. No acute bone abnormality. IMPRESSION: No acute abnormality. Borderline cardiomegaly. Emphysema. Multiple old left rib fractures. Osteopenia. Electronically Signed   By: Lorriane Shire M.D.   On: 04/07/2016 15:02   Dg Hand Complete Left  03/15/2016  CLINICAL DATA:  "Pop" in left wrist.  Pain. EXAM: LEFT HAND - COMPLETE 3+ VIEW COMPARISON:  None. FINDINGS: Degenerative changes in the fingers. No wrist abnormalities identified. IMPRESSION: Degenerative changes in the hand. Electronically Signed   By: Dorise Bullion III M.D   On: 03/15/2016 09:30   Ct Maxillofacial Wo Cm  04/02/2016  CLINICAL DATA:  Recent fall with facial injury EXAM: CT HEAD WITHOUT CONTRAST CT MAXILLOFACIAL WITHOUT CONTRAST TECHNIQUE: Multidetector CT imaging of the head and maxillofacial structures were performed using the standard protocol without intravenous contrast. Multiplanar CT image reconstructions of the maxillofacial structures were also generated. COMPARISON:   10/02/2012 FINDINGS: CT HEAD FINDINGS The bony calvarium is intact. Mild atrophic changes are noted commenced with the patient's given age. No findings to suggest acute hemorrhage, acute infarction or space-occupying mass lesion are noted. CT MAXILLOFACIAL FINDINGS Bony structures of the face show no acute fracture. Prior surgical changes are noted posterior to the right zygomatic arch. The orbits and their contents are within normal limits. The paranasal sinuses are unremarkable. Soft tissue swelling is noted below the nose centrally and eccentric to the right consistent with the recent injury. Degenerative changes of the temporomandibular joints are seen. No blowout fracture is identified. The visualized cervical spine shows degenerative change. No acute for abnormality is noted. IMPRESSION: CT of the head:  No acute intracranial abnormality noted. CT of the maxillofacial bones: No acute bony abnormality is noted. Soft tissue swelling over the right upper lip is seen consistent with the recent injury. Electronically Signed   By: Inez Catalina M.D.   On: 04/02/2016 19:11    Microbiology: No results found for this or any previous visit (from the  past 240 hour(s)).   Labs: Basic Metabolic Panel:  Recent Labs Lab 04/02/16 2007 04/02/16 2009 04/03/16 0205 04/07/16 1427 04/07/16 1620 04/08/16 0409  NA 141 139 139 144  --  141  K 4.0 4.0 3.5 3.4*  --  3.8  CL 104 106 106 111  --  109  CO2  --  21* 20* 20*  --  22  GLUCOSE 210* 213* 173* 154*  --  178*  BUN '16 14 14 9  '$ --  10  CREATININE 0.90 0.98 1.08* 0.79  --  0.81  CALCIUM  --  8.5* 8.3* 8.9  --  8.5*  MG  --   --  1.0*  --  1.3*  --    Liver Function Tests:  Recent Labs Lab 04/02/16 2009 04/03/16 0205 04/07/16 1620  AST 15 14* 14*  ALT 14 13* 12*  ALKPHOS 59 52 63  BILITOT 0.6 0.7 1.1  PROT 5.7* 5.2* 5.8*  ALBUMIN 3.3* 2.9* 3.0*   No results for input(s): LIPASE, AMYLASE in the last 168 hours. No results for input(s): AMMONIA  in the last 168 hours. CBC:  Recent Labs Lab 04/02/16 2007 04/02/16 2009 04/03/16 0205 04/07/16 1427 04/08/16 0409  WBC  --  7.2 7.0 5.4 4.8  NEUTROABS  --  5.1  --   --   --   HGB 10.9* 10.3* 9.6* 9.8* 9.2*  HCT 32.0* 31.1* 29.4* 29.3* 27.9*  MCV  --  84.7 85.5 84.0 84.8  PLT  --  127* 107* 129* 121*   Cardiac Enzymes:  Recent Labs Lab 04/07/16 1822 04/08/16 0409  TROPONINI 0.14* 0.10*   BNP: BNP (last 3 results)  Recent Labs  04/17/15 1459 04/03/16 0038  BNP 302.0* 204.8*    ProBNP (last 3 results) No results for input(s): PROBNP in the last 8760 hours.  CBG:  Recent Labs Lab 04/08/16 0547 04/08/16 1116 04/08/16 1601 04/08/16 2058 04/09/16 0611  GLUCAP 174* 265* 119* 235* 195*       Signed:  Nita Sells MD   [facility] 04/09/2016, 9:53 AM

## 2016-04-09 NOTE — NC FL2 (Signed)
[place] LEVEL OF CARE SCREENING TOOL     IDENTIFICATION  Patient Name: [name] Birthdate: 16-Mar-1934 Sex: female Admission Date (Current Location): 04/07/2016  [facility] [place] Number:  Herbalist and Address:  [facility] [place], [place] [address], [place], [place] [address]      Provider Number: [id number]  Attending Physician Name and Address:  Nita Sells, MD  Relative Name and Phone Number:       Current Level of Care: Hospital Recommended Level of Care: [place] Prior Approval Number:    Date Approved/Denied:   PASRR Number: [id number]  Discharge Plan: SNF    Current Diagnoses: Patient Active Problem List   Diagnosis Date Noted  . Atrial fibrillation with RVR ([name])   . Diabetes mellitus with complication ([name])   . Faintness   . Thrombocytopenia ([name]) 04/07/2016  . Anemia 04/07/2016  . SOB (shortness of breath) on exertion   . Syncope and collapse   . Syncope 04/02/2016  . Physical deconditioning 04/20/2015  . Dyspnea 04/17/2015  . SOB (shortness of breath) 04/17/2015  . Acute renal failure superimposed on stage 3 chronic kidney disease ([name]) 04/17/2015  . Hypokalemia 04/17/2015  . Atrial fibrillation (HCC) CHA2DS2-VASc Score 7 03/13/2012  . HTN (hypertension) 03/13/2012  . Hypothyroid 03/13/2012  . DM (diabetes mellitus), type 2, uncontrolled, with renal complications ([name]) 19/62/2297  . CAD (coronary artery disease) 03/13/2012  . Carcinoid tumor of lung   . Stroke ([place])     Orientation RESPIRATION BLADDER Height & Weight     Self, Time, Place, Situation  Normal Continent Weight: 124 lb 8 oz (56.473 kg) (scale c) Height:  '5\' 3"'$  (160 cm)  BEHAVIORAL SYMPTOMS/MOOD NEUROLOGICAL BOWEL NUTRITION STATUS   (NONE )  (NONE ) Continent Diet (HEART HEALTHY )  AMBULATORY STATUS COMMUNICATION OF NEEDS Skin   Extensive Assist Verbally Normal                       Personal Care  Assistance Level of Assistance  Bathing, Feeding, Dressing Bathing Assistance: Limited assistance Feeding assistance: Independent Dressing Assistance: Limited assistance     Functional Limitations Info  Sight, Hearing, Speech Sight Info: Adequate Hearing Info: Adequate Speech Info: Adequate    SPECIAL CARE FACTORS FREQUENCY  PT (By licensed PT)     PT Frequency: 3              Contractures      Additional Factors Info  Code Status, Allergies Code Status Info: FULL CODE  Allergies Info: Codeine, Hydrocodone-acetaminophen           Current Medications (04/09/2016):  This is the current hospital active medication list Current Facility-Administered Medications  Medication Dose Route Frequency Provider Last Rate Last Dose  . acetaminophen (TYLENOL) tablet 650 mg  650 mg Oral Q6H PRN Radene Gunning, NP       Or  . acetaminophen (TYLENOL) suppository 650 mg  650 mg Rectal Q6H PRN Radene Gunning, NP      . atorvastatin (LIPITOR) tablet 40 mg  40 mg Oral Daily Lezlie Octave Black, NP   40 mg at 04/09/16 1022  . calcium-vitamin D (OSCAL WITH D) 500-200 MG-UNIT per tablet 1 tablet  1 tablet Oral Q breakfast Radene Gunning, NP   1 tablet at 04/09/16 0654  . clobetasol cream (TEMOVATE) 9.89 % 1 application  1 application Topical BID Radene Gunning, NP   1  application at 02/54/27 1024  . colesevelam [facility]) tablet 625 mg  625 mg Oral Daily Lezlie Octave Black, NP   625 mg at 04/09/16 1022  . diltiazem (CARDIZEM CD) 24 hr capsule 180 mg  180 mg Oral Daily Zada Finders, MD   180 mg at 04/09/16 1022  . hydrOXYzine (ATARAX/VISTARIL) tablet 25 mg  25 mg Oral QHS Radene Gunning, NP   25 mg at 04/08/16 2256  . insulin aspart (novoLOG) injection 0-15 Units  0-15 Units Subcutaneous TID WC Radene Gunning, NP   3 Units at 04/09/16 0800  . insulin aspart (novoLOG) injection 0-5 Units  0-5 Units Subcutaneous QHS Radene Gunning, NP   2 Units at 04/08/16 2200  . isosorbide mononitrate (IMDUR) 24 hr tablet 60 mg  60  mg Oral Daily Lezlie Octave Black, NP   60 mg at 04/09/16 1022  . levothyroxine (SYNTHROID, LEVOTHROID) tablet 150 mcg  150 mcg Oral Daily Lezlie Octave Black, NP   150 mcg at 04/09/16 1022  . meclizine (ANTIVERT) tablet 25 mg  25 mg Oral Daily PRN Radene Gunning, NP      . ondansetron [facility]) tablet 4 mg  4 mg Oral Q6H PRN Radene Gunning, NP       Or  . ondansetron [facility]) injection 4 mg  4 mg Intravenous Q6H PRN Lezlie Octave Black, NP      . polyethylene glycol (MIRALAX / GLYCOLAX) packet 17 g  17 g Oral Daily PRN Radene Gunning, NP      . polyvinyl alcohol (LIQUIFILM TEARS) 1.4 % ophthalmic solution 2 drop  2 drop Both Eyes PRN Lezlie Octave Black, NP      . potassium chloride SA (K-DUR,KLOR-CON) CR tablet 40 mEq  40 mEq Oral BID Radene Gunning, NP   40 mEq at 04/09/16 1022  . Rivaroxaban (XARELTO) tablet 15 mg  15 mg Oral Q supper Radene Gunning, NP   15 mg at 04/08/16 1727  . sodium chloride flush (NS) 0.9 % injection 3 mL  3 mL Intravenous Q12H Lezlie Octave Black, NP   3 mL at 04/09/16 1000  . sotalol (BETAPACE) tablet 120 mg  120 mg Oral BID Adrian Prows, MD   120 mg at 04/09/16 1000  . traMADol (ULTRAM) tablet 50 mg  50 mg Oral Q6H PRN Gardiner Barefoot, NP   50 mg at 04/08/16 2256  . triamcinolone (KENALOG) 0.623 % cream 1 application  1 application Topical BID Radene Gunning, NP   1 application at 76/28/31 1024     Discharge Medications: Please see discharge summary for a list of discharge medications.  Relevant Imaging Results:  Relevant Lab Results:   Additional Information SSN [id number]  Deanna Searing, LCSW

## 2016-04-09 NOTE — Progress Notes (Signed)
Inpatient Diabetes Program Recommendations  AACE/ADA: New Consensus Statement on Inpatient Glycemic Control (2015)  Target Ranges:  Prepandial:   less than 140 mg/dL      Peak postprandial:   less than 180 mg/dL (1-2 hours)      Critically ill patients:  140 - 180 mg/dL   Review of Glycemic Control  Inpatient Diabetes Program Recommendation:  Insulin - Meal Coverage: consider adding Novolog 3 units TID with meals (must eat at least 50% of meals to receive dose) Add carb modified to current diet.  Thank you  Raoul Pitch BSN, RN,CDE Inpatient Diabetes Coordinator [phone] (team pager)

## 2016-04-09 NOTE — Care Management Obs Status (Signed)
[place] NOTIFICATION   Patient Details  Name: RICKAYLA WIELAND MRN: [id number] Date of Birth: 09-14-1934   Medicare Observation Status Notification Given:  Yes    Royston Bake, RN 04/09/2016, 2:13 PM

## 2016-04-09 NOTE — Progress Notes (Signed)
PT note Orthostatic BPs  Supine 106/62  Sitting 92/54  Standing 88/52  Standing after 3 min 100/60  BP after 95 feet ambulation, 96/60. See full PT note for full report.  Thanks.  [place] [phone] (pager)

## 2016-04-09 NOTE — Progress Notes (Signed)
BRIEF NUTRITION NOTE:  Pt seen for consult for assessment of nutritional needs. Pt reports decent appetite and no decrease in intake PTA. Per chart, pt is eating 75% of meals. Pt denies N/V and weight has been stable for prior 6 months. Intern does not suspect malnutrition based on physical exam and assessment. Pt is being d/c today. No further nutrition interventions warranted at this time. Please re consult if nutrition issues arise.   Geoffery Lyons, [place] [facility] Dietetic Intern Pager [phone]

## 2016-04-09 NOTE — Clinical Social Work Note (Signed)
Clinical Social Worker has met with patient at bedside in reference to post-acute placement. Patient agreeable and prefers [facility]. Full psychosocial assessment to follow.  Referral made. CSW remains available as needed.   [place]endon Axe, MSW, LCSWA [phone] 04/09/2016 12:15 PM

## 2016-04-09 NOTE — Progress Notes (Addendum)
Physical Therapy Treatment Patient Details Name: [name] MRN: [id number] DOB: October 18, 1934 Today's Date: 04/09/2016    History of Present Illness pt presents with Syncopal Episodes resulting in falls.  pt has recent admit for the same.  pt with hx of Lung CA and multiple tumors removed, CVA, DM, CAD, HTN, Hypothyroidism, A-fib, and Jaw Replacement.      PT Comments    Pt admitted with above diagnosis. Pt currently with functional limitations due to balance and endurance deficits. Pt was able to ambulate with RW with min assist.  Needs SNF as husband cannot handle pt at home.  Vestibular testing negative.  Pt does appear to have orthostatic changes to BP however.  MD aware.   Pt will benefit from skilled PT to increase their independence and safety with mobility to allow discharge to the venue listed below.    Follow Up Recommendations  SNF     Equipment Recommendations  Rolling walker with 5" wheels    Recommendations for Other Services       Precautions / Restrictions Precautions Precautions: Fall Restrictions Weight Bearing Restrictions: No    Mobility  Bed Mobility Overal bed mobility: Needs Assistance Bed Mobility: Supine to Sit     Supine to sit: Min assist;HOB elevated Sit to supine: Min assist   General bed mobility comments: pt very painful and relied on bil UEs to pull herself to sitting.  pt did ask to do as much as she is able on her own.  Pt tested bil with Modified hallpike with negative testing for both.   Transfers Overall transfer level: Needs assistance Equipment used: 2 person hand held assist;Rolling walker (2 wheeled) Transfers: Sit to/from Stand Sit to Stand: Min assist;+2 physical assistance         General transfer comment: pt very painful with all movement.  pt does weightbear through LEs well.  pt remains with flexed posture due to pain.  Needs steadying assist or support with RW.   Ambulation/Gait Ambulation/Gait assistance: Min  guard;+2 safety/equipment Ambulation Distance (Feet): 95 Feet Assistive device: Rolling walker (2 wheeled) Gait Pattern/deviations: Step-through pattern;Decreased stride length;Antalgic Gait velocity: slow Gait velocity interpretation: Below normal speed for age/gender General Gait Details: Cues for upright posture.  Antalgic gait continues.  Pt needs steadying assist with mobility.     Stairs            Wheelchair Mobility    Modified Rankin (Stroke Patients Only)       Balance Overall balance assessment: Needs assistance Sitting-balance support: No upper extremity supported;Feet supported Sitting balance-Leahy Scale: Fair Sitting balance - Comments: can sit without UE support.   Standing balance support: Bilateral upper extremity supported;During functional activity Standing balance-Leahy Scale: Poor Standing balance comment: relies on UE support.                     Cognition Arousal/Alertness: Awake/alert Behavior During Therapy: Flat affect Overall Cognitive Status: Within Functional Limits for tasks assessed                      Exercises      General Comments        Pertinent Vitals/Pain Pain Assessment: Faces Faces Pain Scale: Hurts whole lot Pain Location: entire left side per pt Pain Descriptors / Indicators: Aching;Sore Pain Intervention(s): Limited activity within patient's tolerance;Monitored during session;Repositioned  Orthostatic BPs  Supine 106/62  Sitting 92/54  Standing 88/52  Standing after 3 min 100/60  BP after  95 feet ambulation, 96/60. O2 sats 95% on 2L.  92-94% on RA.      Home Living                      Prior Function            PT Goals (current goals can now be found in the care plan section) Progress towards PT goals: Progressing toward goals    Frequency  Min 3X/week    PT Plan Current plan remains appropriate    Co-evaluation             End of Session Equipment Utilized During  Treatment: Gait belt Activity Tolerance: Patient limited by pain Patient left: in chair;with call bell/phone within reach;with chair alarm set     Time: 9295-7473 PT Time Calculation (min) (ACUTE ONLY): 23 min  Charges:  $Gait Training: 8-22 mins $Therapeutic Activity: 8-22 mins                    G CodesIrwin Brakeman F 18-Apr-2016, 2:07 PM [facility] [phone] (pager)

## 2016-04-09 NOTE — Progress Notes (Signed)
Subjective:  [name] is a 82 y.o. female with a history of rare form of low-grade lung carcinoid (followed at [facility]), permanent a.fib on Xarelto, hyperlipidemia, HTN, DM, hypothyroidism, and non critical CAD by cath on 04/20/2015. She presented on 04/02/2016 for evaluation of syncope. She was standing in her kitchen when she became lightheaded. She sat down and symptoms began to improve so she stood back up again and lightheadedness returned then she woke up on the floor. She denied any chest pain or palpitations but had reported fatigue and shortness of breath earlier in the day. She was discharged home on 04/04/2016 and was scheduled to pick up an event monitor from our office on 04/07/2016 to evaluate for arrhythmogenic etiology of syncope, although no significant arrhythmias or heart block was noted during hospitalization. She, however, again presented to the ED on 04/07/2016 with multiple recurrent falls and syncopal episodes. She was noted to be in a.fib with RVR with rates in the 120s on arrival which improved with IV bolus of cardizem.  No new complaints except chest pain at the trauma site where her husband held her when she fell. C/O fatigue and chronic dyspnea.  Objective:  Vital Signs in the last 24 hours: Temp:  [97.3 F (36.3 C)-98.1 F (36.7 C)] 98.1 F (36.7 C) (04/12 0401) Pulse Rate:  [65-97] 86 (04/12 0401) Resp:  [20] 20 (04/12 0401) BP: (88-113)/(50-76) 113/76 mmHg (04/12 0401) SpO2:  [94 %-99 %] 94 % (04/12 0401) Weight:  [56.473 kg (124 lb 8 oz)] 56.473 kg (124 lb 8 oz) (04/12 0401)  Intake/Output from previous day: 04/11 0701 - 04/12 0700 In: 16 [P.O.:960] Out: 350 [Urine:350]  Physical Exam: General appearance: alert, cooperative, appears stated age and no distress, frail. Lungs: clear to auscultation bilaterally Heart: Distant heart sounds. S1 is variable, S2 is normal, 2/6 systolic ejection murmur at the apex. No gallop. No pericardial rub. Abdomen:  soft, non-tender; bowel sounds normal; no masses, no organomegaly Extremities: extremities normal, atraumatic, no cyanosis or edema Pulses: 2+ and symmetric Neurologic: Grossly normal    Lab Results: BMP  Recent Labs  04/03/16 0205 04/07/16 1427 04/08/16 0409  NA 139 144 141  K 3.5 3.4* 3.8  CL 106 111 109  CO2 20* 20* 22  GLUCOSE 173* 154* 178*  BUN '14 9 10  '$ CREATININE 1.08* 0.79 0.81  CALCIUM 8.3* 8.9 8.5*  GFRNONAA 48* >60 >60  GFRAA 55* >60 >60    CBC  Recent Labs Lab 04/02/16 2009  04/08/16 0409  WBC 7.2  < > 4.8  RBC 3.67*  < > 3.29*  HGB 10.3*  < > 9.2*  HCT 31.1*  < > 27.9*  PLT 127*  < > 121*  MCV 84.7  < > 84.8  MCH 28.1  < > 28.0  MCHC 33.1  < > 33.0  RDW 13.1  < > 13.3  LYMPHSABS 1.4  --   --   MONOABS 0.6  --   --   EOSABS 0.1  --   --   BASOSABS 0.0  --   --   < > = values in this interval not displayed.  HEMOGLOBIN A1C Lab Results  Component Value Date   HGBA1C 7.9* 04/07/2016   MPG 180 04/07/2016    Recent Labs  04/07/16 1822 04/08/16 0409  TROPONINI 0.14* 0.10*    Recent Labs  04/07/16 1822  TSH 0.031*   Recent Labs  04/02/16 2009 04/03/16 0205 04/07/16 1620  PROT 5.7*  5.2* 5.8*  ALBUMIN 3.3* 2.9* 3.0*  AST 15 14* 14*  ALT 14 13* 12*  ALKPHOS 59 52 63  BILITOT 0.6 0.7 1.1  BILIDIR  --   --  0.1  IBILI  --   --  1.0*   Telemetry 04/09/2016: Afib with V rate of 60-75 bpm. No heart block or other ventricular arrhythmia noted.  EKG 04/07/2016: afib with RVR at a rate of 127bom, normal axis, diffuse nonspecific ST and T wave abnormality, PRWP, low voltage complexes. No significant change from prior EKG aside from increased rate. EKG 04/08/2016 at 2300 hrs: Atrial fibrillation with controlled ventricular response at a rate of 90 beats a minute, rightward axis, nonspecific T abnormality. Normal QT interval. (patient has received 2 higher doses of sotalol)  Echo 04/03/2016:  - Left ventricle: The cavity size was normal.  Systolic function was normal. The estimated ejection fraction was in the range of 60-65%. The study is not technically sufficient to allow evaluation of LV diastolic function. - Aortic valve: Mildly calcified annulus. Trileaflet; mildly calcified leaflets. There was mild stenosis. There was mild regurgitation. Valve area (VTI): 0.74 cm^2. Valve area (Vmax):0.63 cm^2. Valve area (Vmean): 0.63 cm^2. - Mitral valve: Calcified annulus. There was mild regurgitation. - Left atrium: The atrium was moderately dilated. - Right ventricle: The cavity size was mildly dilated. Systolic function was mildly reduced. - Right atrium: The atrium was mildly dilated. - Atrial septum: No defect or patent foramen ovale was identified. - Tricuspid valve: There was moderate regurgitation. - Pulmonary arteries: PA peak pressure: 47 mm Hg (S).   Scheduled Meds: . atorvastatin  40 mg Oral Daily  . calcium-vitamin D  1 tablet Oral Q breakfast  . clobetasol cream  1 application Topical BID  . colesevelam  625 mg Oral Daily  . diltiazem  180 mg Oral Daily  . hydrOXYzine  25 mg Oral QHS  . insulin aspart  0-15 Units Subcutaneous TID WC  . insulin aspart  0-5 Units Subcutaneous QHS  . isosorbide mononitrate  60 mg Oral Daily  . levothyroxine  150 mcg Oral Daily  . potassium chloride  40 mEq Oral BID  . Rivaroxaban  15 mg Oral Q supper  . sodium chloride flush  3 mL Intravenous Q12H  . sotalol  120 mg Oral BID  . triamcinolone  1 application Topical BID   Continuous Infusions:  PRN Meds:.acetaminophen **OR** acetaminophen, meclizine, ondansetron **OR** ondansetron (ZOFRAN) IV, polyethylene glycol, polyvinyl alcohol, traMADol   Assessment/Plan:  1. Syncope 2. Chronic A. Fib rate controlled and tolerating Sotalol 120 mg BID. (CHA2DS2-VASc Score is 6 with yearly risk of stroke of 9.8%. HAS-Bled score is 2 and estimated major bleeding in one year is 1.88-3.2%); on Xarelto for anticoagulation 3. Non critical CAD by  cath on 04/20/2015 (D1 and D2 have high-grade stenosis, very small and unchanged from 2007. Mid LAD 50% stenosis unchanged from 2007) 4. Essential Hypertension 5. HLD 6. Uncontrolled Type II Diabetes Mellitus 7. Bilateral Fibromuscular dysplasia of renal arteries (incidental finding during cardiac cath Sept 2007) 8. Carcinoid bronchial adenoma 9. Anemia and chronic thrombocytopenia, stable.  10. Failure to thrive and fall risk. 11. Thyroid function abnormality. TSH low and FT4 high and ?? may need to reduce Synthroid dose and leave it to primary team.  Recommendation: I do not see any obvious reasons for her recurrent episodes of syncope, they all appear to be neurocardiogenic, most episodes occurring while patient was standing up and never while sitting or  laying down in bed. No significant arrhythmias are evident except for chronic atrial fibrillation, which is rate controlled today.  I am trying to avoid implantation of a loop recorder, she may benefit from an event monitor for 30 days. Although fall risk, this can be avoided, for now due to high risk for cardioembolic phenomena, continue anticoagulation. Would recommend skilled nursing facility placement and consideration for rehabilitation until she regains her strength. Previously independent prior to the hospital admission.  Arrangements to be made upon discharge to come for an event monitor hookup in our office. Thyroid function abnormality noted. As dictated above, no significant arrhythmias except for atrial fibrillation which is rate controlled, no heart block or mental arrhythmias. Please call me upon discharge, patient can stop by at our office for an event monitor hookup or arrangements can be made from the rehabilitation facility to come to our office for a event monitor placement.  Adrian Prows, M.D. 04/09/2016, 8:27 AM [facility] Pager: [phone] Office: [phone] If no answer: [phone]

## 2016-04-09 NOTE — Progress Notes (Addendum)
Sotalol held last night by previous RN. Parameters to notify MD were for QT > 450, not QTc >450. QT last night was 384. Previous QT last afternoon was 263. Will continue to monitor and pass on parameter info to day shift for next dose.

## 2016-04-09 NOTE — Clinical Social Work Note (Signed)
Clinical Social Worker facilitated patient discharge including contacting patient family and facility to confirm patient discharge plans.  Clinical information faxed to facility and family agreeable with plan.  CSW arranged ambulance transport via [facility] to [place].  RN to call report prior to discharge.  Clinical Social Worker will sign off for now as social work intervention is no longer needed. Please consult [place] again if new need arises.  Glendon Axe, MSW, LCSWA [phone] 04/09/2016 2:31 PM

## 2016-04-09 NOTE — Clinical Social Work Placement (Signed)
   CLINICAL SOCIAL WORK PLACEMENT  NOTE  Date:  04/09/2016  Patient Details  Name: [name] MRN: [id number] Date of Birth: 1934-11-16  Clinical Social Work is seeking post-discharge placement for this patient at the [place] level of care (*CSW will initial, date and re-position this form in  chart as items are completed):  Yes   Patient/family provided with [place] Work Department's list of facilities offering this level of care within the geographic area requested by the patient (or if unable, by the patient's family).  Yes   Patient/family informed of their freedom to choose among providers that offer the needed level of care, that participate in Medicare, Medicaid or managed care program needed by the patient, have an available bed and are willing to accept the patient.  Yes   Patient/family informed of [facility]'s ownership interest in [facility] and [facility], as well as of the fact that they are under no obligation to receive care at these facilities.  PASRR submitted to EDS on 04/09/16     PASRR number received on 04/09/16     Existing PASRR number confirmed on       FL2 transmitted to all facilities in geographic area requested by pt/family on 04/09/16     FL2 transmitted to all facilities within larger geographic area on       Patient informed that his/her managed care company has contracts with or will negotiate with certain facilities, including the following:        Yes   Patient/family informed of bed offers received.  Patient chooses bed at  [facility] )     Physician recommends and patient chooses bed at      Patient to be transferred to  [facility] ) on 04/09/16.  Patient to be transferred to facility by  [facility] )     Patient family notified on 04/09/16 of transfer.  Name of family member notified:   (Pt's husband, Jeneen Rinks )     PHYSICIAN Please sign FL2, Please  prepare priority discharge summary, including medications     Additional Comment:    _______________________________________________ Rozell Searing, LCSW 04/09/2016, 2:03 PM

## 2016-04-10 ENCOUNTER — Encounter: Payer: Self-pay | Admitting: Internal Medicine

## 2016-04-10 ENCOUNTER — Non-Acute Institutional Stay (SKILLED_NURSING_FACILITY): Payer: Medicare Other | Admitting: Internal Medicine

## 2016-04-10 DIAGNOSIS — I480 Paroxysmal atrial fibrillation: Secondary | ICD-10-CM

## 2016-04-10 DIAGNOSIS — R5381 Other malaise: Secondary | ICD-10-CM | POA: Diagnosis not present

## 2016-04-10 DIAGNOSIS — I10 Essential (primary) hypertension: Secondary | ICD-10-CM | POA: Diagnosis not present

## 2016-04-10 DIAGNOSIS — D649 Anemia, unspecified: Secondary | ICD-10-CM

## 2016-04-10 DIAGNOSIS — D696 Thrombocytopenia, unspecified: Secondary | ICD-10-CM

## 2016-04-10 DIAGNOSIS — E878 Other disorders of electrolyte and fluid balance, not elsewhere classified: Secondary | ICD-10-CM

## 2016-04-10 DIAGNOSIS — E118 Type 2 diabetes mellitus with unspecified complications: Secondary | ICD-10-CM

## 2016-04-10 DIAGNOSIS — R296 Repeated falls: Secondary | ICD-10-CM

## 2016-04-10 DIAGNOSIS — I25119 Atherosclerotic heart disease of native coronary artery with unspecified angina pectoris: Secondary | ICD-10-CM | POA: Diagnosis not present

## 2016-04-10 DIAGNOSIS — E034 Atrophy of thyroid (acquired): Secondary | ICD-10-CM | POA: Diagnosis not present

## 2016-04-10 DIAGNOSIS — R55 Syncope and collapse: Secondary | ICD-10-CM

## 2016-04-10 DIAGNOSIS — E038 Other specified hypothyroidism: Secondary | ICD-10-CM

## 2016-04-10 NOTE — Clinical Social Work Note (Signed)
Clinical Social Work Assessment  Patient Details  Name: [name] MRN: [id number] Date of Birth: 1934/11/06  Date of referral:  04/09/16               Reason for consult:  Facility Placement, Discharge Planning                Permission sought to share information with:  Family Supports, Customer service manager, Case Optician, dispensing granted to share information::  Yes, Verbal Permission Granted  Name::      Nehemiah Settle )  Agency::   [facility] )  Relationship::   (Spouse )  Contact Information:   [phone])  Housing/Transportation Living arrangements for the past 2 months:  [place] of Information:  Patient Patient Interpreter Needed:  None Criminal Activity/Legal Involvement Pertinent to Current Situation/Hospitalization:  No - Comment as needed Significant Relationships:  Spouse Lives with:  Spouse Do you feel safe going back to the place where you live?  No Need for family participation in patient care:  Yes (Comment)  Care giving concerns:  Patient requiring SNF placement for short-term rehab.    Social Worker assessment / plan:  Holiday representative met with patient at bedside in reference to post-acute placement for SNF. CSW introduced CSW role and SNF process. Pt is familiar with SNF, [facility] as she reported her sister received rehab there. Pt prefers [facility]. Patient reported her husband was not feeling well and returned home this morning. FL-2 completed and faxed via [facility]. [facility] accepts. Pt stated she will notify husband of her transferring to [place]. No further concerns reported at this time. CSW will continue to follow pt and pt 's family for continued support and to facilitate pt's discharge needs once medically stable.   Employment status:  Retired Nurse, adult PT Recommendations:  [place] / Referral to community resources:  [place]  Patient/Family's Response to care:  Pt a/o x4. Pt agreeable to SNF placement. Pt pleasant, polite and appreciated social work intervention.   Patient/Family's Understanding of and Emotional Response to Diagnosis, Current Treatment, and Prognosis:  Pt knowledgeable of medical interventions and on-going treatment and follow up appointments.   Emotional Assessment Appearance:  Appears older than stated age Attitude/Demeanor/Rapport:   (Pleasant) Affect (typically observed):  Accepting, Appropriate, Calm, Pleasant Orientation:  Oriented to  Time, Oriented to Situation, Oriented to Self, Oriented to Place Alcohol / Substance use:  Not Applicable Psych involvement (Current and /or in the community):  No (Comment)  Discharge Needs  Concerns to be addressed:  Care Coordination Readmission within the last 30 days:  No Current discharge risk:  Dependent with Mobility Barriers to Discharge:  No Barriers Identified   Glendon Axe, MSW, LCSWA [phone] 04/10/2016 11:43 AM

## 2016-04-10 NOTE — Progress Notes (Signed)
Patient ID: [name], female   DOB: [birth year], 82 y.o.   MRN: [id number]    HISTORY AND PHYSICAL   DATE: 04/10/16  Location:  [facility]    Place of Service: SNF (31)   Extended Emergency Contact Information Primary Emergency Contact: Wedemeyer,James Address: [place], [place] [address] [place] Phone: [phone] Relation: Spouse  Advanced Directive information  FULL CODE  Chief Complaint  Patient presents with  . New Admit To SNF    HPI:  82 yo female seen today as a new admission into SNF following hospital stay for syncope and collapse, afib w RVR, hypothyroidism, HTN, DM, CAD, hypokalemia, thrombocytopenia, anemia. She presented to the ED s/p fall with syncope. Etiology for syncope unclear as w/u neg. CT head and face neg for acute process although chronic small vessel disease noted in head. Xray left hand neg for acute process but did show arthritis. On telemetry she had a 1.5 sec pause but heart rate overall controlled once meds adjusted. Cardiology followed pt. CHA2DS2-VASc score 7. TSH 0.031 and free T4 1.27 so levothyroxine dose reduced. plts ranged 107-121K. Albumin 3.0. Hgb 9.2; B12 264; iron 18 with nml ferritin. A1c 7.9%. Mg 1.0-->1.3 at d/c  She c/o left leg pain and ACW pain since fall. She is taking tramadol prn. She has chronic SOB and wore O2 at home prior to admission. No nursing issues. No falls since admission.   DM - uncontrolled with a1c 7.9%. CBG 336. She is taking SSI Novolog  Afib/CAD - rate controlled on sotalol and cardizem. She takes xeralto for anticoagulation. Also takes imdur  Hyperlipidemia - stable on lipitor. No myalgias  Hypothyroidism - on levothyroxine  She is taking mag oxide and other vitamins daily  Past Medical History  Diagnosis Date  . Lung cancer ([place])   . Stroke ([place])   . Diabetes mellitus   . CAD (coronary artery disease) 03/13/2012  . Type II or unspecified type  diabetes mellitus without mention of complication, not stated as uncontrolled 03/13/2012  . Hypothyroid 03/13/2012  . HTN (hypertension) 03/13/2012  . Dysrhythmia     ATRIAL FIB  . Shortness of breath   . Syncope and collapse 04/02/2016    Past Surgical History  Procedure Laterality Date  . Cardioversion  04/20/2012    Procedure: CARDIOVERSION;  Surgeon: Laverda Page, MD;  Location: [facility];  Service: Cardiovascular;  Laterality: N/A;  . Abdominal hysterectomy    . Tumors      back of head  . Hemorrhoid surgery    . Jaw replacement    . Cholecystectomy    . Breast surgery      benign tumors removed in 1980  . Cardiac catheterization    . Cardioversion N/A 05/10/2013    Procedure: CARDIOVERSION;  Surgeon: Laverda Page, MD;  Location: [facility];  Service: Cardiovascular;  Laterality: N/A;  . Left heart catheterization with coronary angiogram N/A 04/20/2015    Procedure: LEFT HEART CATHETERIZATION WITH CORONARY ANGIOGRAM;  Surgeon: [place] Prows, MD;  Location: [facility] CATH LAB;  Service: Cardiovascular;  Laterality: N/A;    Patient Care Team: Anda Kraft, MD as PCP - General (Endocrinology)  Social History   Social History  . Marital Status: Married    Spouse Name: N/A  . Number of Children: N/A  . Years of Education: N/A   Occupational History  . Not on file.  Social History Main Topics  . Smoking status: Never Smoker   . Smokeless tobacco: Never Used  . Alcohol Use: No  . Drug Use: No  . Sexual Activity: Not on file   Other Topics Concern  . Not on file   Social History Narrative     reports that she has never smoked. She has never used smokeless tobacco. She reports that she does not drink alcohol or use illicit drugs.  Family History  Problem Relation Age of Onset  . Breast cancer Mother   . Aneurysm Father     Likely thoracic aneurysm per patient  . Melanoma Brother   . Heart disease Sister   . Diabetes type II Sister    No family status information  on file.     There is no immunization history on file for this patient.  Allergies  Allergen Reactions  . Codeine Nausea And Vomiting  . Hydrocodone-Acetaminophen Nausea And Vomiting    Medications: Patient's Medications  New Prescriptions   No medications on file  Previous Medications   ACETAMINOPHEN (TYLENOL) 500 MG TABLET    Take 500 mg by mouth every 6 (six) hours as needed for moderate pain or headache.    ATORVASTATIN (LIPITOR) 40 MG TABLET    Take 1 tablet (40 mg total) by mouth daily.   CALCIUM CARB-CHOLECALCIFEROL (CALCIUM-VITAMIN D3) 600-500 MG-UNIT CAPS    Take 1 capsule by mouth daily.   CLOBETASOL (TEMOVATE) 0.05 % EXTERNAL SOLUTION    Apply 1 application topically 2 (two) times daily.   CLOBETASOL CREAM (TEMOVATE) 0.05 %    Apply 1 application topically 2 (two) times daily.   COLESEVELAM (WELCHOL) 625 MG TABLET    Take 625 mg by mouth daily.    DILTIAZEM (CARDIZEM CD) 180 MG 24 HR CAPSULE    Take 1 capsule (180 mg total) by mouth daily.   HYDROXYZINE (VISTARIL) 25 MG CAPSULE    Take 25 mg by mouth at bedtime.   ISOSORBIDE MONONITRATE (IMDUR) 60 MG 24 HR TABLET    Take 1 tablet (60 mg total) by mouth daily.   LEVOTHYROXINE (SYNTHROID, LEVOTHROID) 100 MCG TABLET    Take 1 tablet (100 mcg total) by mouth daily.   MAG OXIDE-VIT D3-TURMERIC (MAGNESIUM-VITAMIN D3-TURMERIC PO)    Take 1 tablet by mouth daily.   MAGNESIUM OXIDE (MAG-OX) 400 (241.3 MG) MG TABLET    Take 1 tablet (400 mg total) by mouth 2 (two) times daily.   MECLIZINE (ANTIVERT) 25 MG TABLET    Take 25 mg by mouth daily as needed for dizziness.    MULTIPLE VITAMINS-MINERALS (WOMENS BONE HEALTH PO)    Take 1 tablet by mouth daily.   POLYETHYL GLYCOL-PROPYL GLYCOL (SYSTANE) 0.4-0.3 % SOLN    Apply 2 drops to eye 2 (two) times daily as needed (dry eyes).   POLYVINYL ALCOHOL (LIQUIFILM TEARS) 1.4 % OPHTHALMIC SOLUTION    Place 2 drops into both eyes as needed for dry eyes.    RIVAROXABAN (XARELTO) 15 MG TABS TABLET     Take 15 mg by mouth daily with supper.   SOTALOL (BETAPACE) 120 MG TABLET    Take 1 tablet (120 mg total) by mouth 2 (two) times daily.   TRAMADOL (ULTRAM) 50 MG TABLET    Take 1 tablet (50 mg total) by mouth every 6 (six) hours as needed.   TRIAMCINOLONE (KENALOG) 0.025 % OINTMENT    Apply 1 application topically 2 (two) times daily. Do not apply to face  Modified Medications   No medications on file  Discontinued Medications   No medications on file    Review of Systems  Constitutional: Positive for fatigue.  HENT: Positive for trouble swallowing.   Cardiovascular: Positive for chest pain.  Musculoskeletal: Positive for arthralgias.  Neurological: Positive for weakness.  All other systems reviewed and are negative.   Filed Vitals:   04/10/16 1653  BP: 115/83  Pulse: 67  Temp: 96 F (35.6 C)  SpO2: 93%   There is no weight on file to calculate BMI.  Physical Exam  Constitutional: She is oriented to person, place, and time. She appears well-developed.  Frail appearing, sitting up in bed eating lunch. [place] O2 intact  HENT:  Mouth/Throat: Oropharynx is clear and moist. No oropharyngeal exudate.  Eyes: Pupils are equal, round, and reactive to light. No scleral icterus.  Neck: Neck supple. Carotid bruit is not present. No tracheal deviation present. No thyromegaly present.  Cardiovascular: Normal rate and intact distal pulses.  An irregularly irregular rhythm present. Exam reveals no gallop and no friction rub.   Murmur heard.  Systolic murmur is present with a grade of 1/6  No LE edema b/l. no calf TTP.   Pulmonary/Chest: Effort normal and breath sounds normal. No stridor. No respiratory distress. She has no wheezes. She has no rales.  Abdominal: Soft. Bowel sounds are normal. She exhibits no distension and no mass. There is no hepatomegaly. There is no tenderness. There is no rebound and no guarding.  Musculoskeletal: She exhibits edema and tenderness.  Lymphadenopathy:     She has no cervical adenopathy.  Neurological: She is alert and oriented to person, place, and time.  Skin: Skin is warm and dry. No rash noted.  facial contusions with bruising noted at upper and lower lip. No secondary signs of infection  Psychiatric: She has a normal mood and affect. Her behavior is normal. Thought content normal.     Labs reviewed: Admission on 04/07/2016, Discharged on 04/09/2016  Component Date Value Ref Range Status  . Sodium 04/07/2016 144  135 - 145 mmol/L Final  . Potassium 04/07/2016 3.4* 3.5 - 5.1 mmol/L Final  . Chloride 04/07/2016 111  101 - 111 mmol/L Final  . CO2 04/07/2016 20* 22 - 32 mmol/L Final  . Glucose, Bld 04/07/2016 154* 65 - 99 mg/dL Final  . BUN 04/07/2016 9  6 - 20 mg/dL Final  . Creatinine, Ser 04/07/2016 0.79  0.44 - 1.00 mg/dL Final  . Calcium 04/07/2016 8.9  8.9 - 10.3 mg/dL Final  . GFR calc non Af Amer 04/07/2016 >60  >60 mL/min Final  . GFR calc Af Amer 04/07/2016 >60  >60 mL/min Final   Comment: (NOTE) The eGFR has been calculated using the CKD EPI equation. This calculation has not been validated in all clinical situations. eGFR's persistently <60 mL/min signify possible Chronic Kidney Disease.   . Anion gap 04/07/2016 13  5 - 15 Final  . WBC 04/07/2016 5.4  4.0 - 10.5 K/uL Final  . RBC 04/07/2016 3.49* 3.87 - 5.11 MIL/uL Final  . Hemoglobin 04/07/2016 9.8* 12.0 - 15.0 g/dL Final  . HCT 04/07/2016 29.3* 36.0 - 46.0 % Final  . MCV 04/07/2016 84.0  78.0 - 100.0 fL Final  . MCH 04/07/2016 28.1  26.0 - 34.0 pg Final  . MCHC 04/07/2016 33.4  30.0 - 36.0 g/dL Final  . RDW 04/07/2016 13.2  11.5 - 15.5 % Final  . Platelets 04/07/2016 129* 150 - 400 K/uL Final  .  Troponin i, poc 04/07/2016 0.15* 0.00 - 0.08 ng/mL Final  . Comment 04/07/2016 NOTIFIED PHYSICIAN   Final  . Comment 3 04/07/2016          Final   Comment: Due to the release kinetics of cTnI, a negative result within the first hours of the onset of symptoms does not rule  out myocardial infarction with certainty. If myocardial infarction is still suspected, repeat the test at appropriate intervals.   . Magnesium 04/07/2016 1.3* 1.7 - 2.4 mg/dL Final  . Total Protein 04/07/2016 5.8* 6.5 - 8.1 g/dL Final  . Albumin 04/07/2016 3.0* 3.5 - 5.0 g/dL Final  . AST 04/07/2016 14* 15 - 41 U/L Final  . ALT 04/07/2016 12* 14 - 54 U/L Final  . Alkaline Phosphatase 04/07/2016 63  38 - 126 U/L Final  . Total Bilirubin 04/07/2016 1.1  0.3 - 1.2 mg/dL Final  . Bilirubin, Direct 04/07/2016 0.1  0.1 - 0.5 mg/dL Final  . Indirect Bilirubin 04/07/2016 1.0* 0.3 - 0.9 mg/dL Final  . TSH 04/07/2016 0.031* 0.350 - 4.500 uIU/mL Final  . Hgb A1c MFr Bld 04/07/2016 7.9* 4.8 - 5.6 % Final   Comment: (NOTE)         Pre-diabetes: 5.7 - 6.4         Diabetes: >6.4         Glycemic control for adults with diabetes: <7.0   . Mean Plasma Glucose 04/07/2016 180   Final   Comment: (NOTE) Performed At: [facility] [place], [place] [id number] Lindon Romp MD [id number]   . Sodium 04/08/2016 141  135 - 145 mmol/L Final  . Potassium 04/08/2016 3.8  3.5 - 5.1 mmol/L Final  . Chloride 04/08/2016 109  101 - 111 mmol/L Final  . CO2 04/08/2016 22  22 - 32 mmol/L Final  . Glucose, Bld 04/08/2016 178* 65 - 99 mg/dL Final  . BUN 04/08/2016 10  6 - 20 mg/dL Final  . Creatinine, Ser 04/08/2016 0.81  0.44 - 1.00 mg/dL Final  . Calcium 04/08/2016 8.5* 8.9 - 10.3 mg/dL Final  . GFR calc non Af Amer 04/08/2016 >60  >60 mL/min Final  . GFR calc Af Amer 04/08/2016 >60  >60 mL/min Final   Comment: (NOTE) The eGFR has been calculated using the CKD EPI equation. This calculation has not been validated in all clinical situations. eGFR's persistently <60 mL/min signify possible Chronic Kidney Disease.   . Anion gap 04/08/2016 10  5 - 15 Final  . WBC 04/08/2016 4.8  4.0 - 10.5 K/uL Final  . RBC 04/08/2016 3.29* 3.87 - 5.11 MIL/uL Final  . Hemoglobin 04/08/2016 9.2* 12.0  - 15.0 g/dL Final  . HCT 04/08/2016 27.9* 36.0 - 46.0 % Final  . MCV 04/08/2016 84.8  78.0 - 100.0 fL Final  . MCH 04/08/2016 28.0  26.0 - 34.0 pg Final  . MCHC 04/08/2016 33.0  30.0 - 36.0 g/dL Final  . RDW 04/08/2016 13.3  11.5 - 15.5 % Final  . Platelets 04/08/2016 121* 150 - 400 K/uL Final  . Troponin I 04/07/2016 0.14* <0.031 ng/mL Final   Comment:        PERSISTENTLY INCREASED TROPONIN VALUES IN THE RANGE OF 0.04-0.49 ng/mL CAN BE SEEN IN:       -UNSTABLE ANGINA       -CONGESTIVE HEART FAILURE       -MYOCARDITIS       -CHEST TRAUMA       -ARRYHTHMIAS       -  LATE PRESENTING MYOCARDIAL INFARCTION       -COPD   CLINICAL FOLLOW-UP RECOMMENDED.   Marland Kitchen Troponin I 04/08/2016 0.10* <0.031 ng/mL Final   Comment:        PERSISTENTLY INCREASED TROPONIN VALUES IN THE RANGE OF 0.04-0.49 ng/mL CAN BE SEEN IN:       -UNSTABLE ANGINA       -CONGESTIVE HEART FAILURE       -MYOCARDITIS       -CHEST TRAUMA       -ARRYHTHMIAS       -LATE PRESENTING MYOCARDIAL INFARCTION       -COPD   CLINICAL FOLLOW-UP RECOMMENDED.   . Vitamin B-12 04/07/2016 264  180 - 914 pg/mL Final   Comment: (NOTE) This assay is not validated for testing neonatal or myeloproliferative syndrome specimens for Vitamin B12 levels.   . Folate 04/07/2016 21.8  >5.9 ng/mL Final  . Iron 04/07/2016 18* 28 - 170 ug/dL Final  . TIBC 04/07/2016 245* 250 - 450 ug/dL Final  . Saturation Ratios 04/07/2016 7* 10.4 - 31.8 % Final  . UIBC 04/07/2016 227   Final  . Ferritin 04/07/2016 159  11 - 307 ng/mL Final  . Retic Ct Pct 04/07/2016 1.8  0.4 - 3.1 % Final  . RBC. 04/07/2016 3.49* 3.87 - 5.11 MIL/uL Final  . Retic Count, Manual 04/07/2016 62.8  19.0 - 186.0 K/uL Final  . Glucose-Capillary 04/07/2016 270* 65 - 99 mg/dL Final  . Glucose-Capillary 04/08/2016 174* 65 - 99 mg/dL Final  . T3, Free 04/08/2016 1.7* 2.0 - 4.4 pg/mL Final   Comment: (NOTE) Performed At: [facility] [place], [place]  [id number] Lindon Romp MD [id number]   . Free T4 04/08/2016 1.27* 0.61 - 1.12 ng/dL Final  . Glucose-Capillary 04/08/2016 265* 65 - 99 mg/dL Final  . Comment 1 04/08/2016 Notify RN   Final  . Glucose-Capillary 04/08/2016 119* 65 - 99 mg/dL Final  . Comment 1 04/08/2016 Notify RN   Final  . Glucose-Capillary 04/08/2016 235* 65 - 99 mg/dL Final  . Comment 1 04/08/2016 Notify RN   Final  . Comment 2 04/08/2016 Document in Chart   Final  . Glucose-Capillary 04/09/2016 195* 65 - 99 mg/dL Final  . Comment 1 04/09/2016 Notify RN   Final  . Comment 2 04/09/2016 Document in Chart   Final  . Magnesium 04/09/2016 1.2* 1.7 - 2.4 mg/dL Final  . Sodium 04/09/2016 139  135 - 145 mmol/L Final  . Potassium 04/09/2016 5.0  3.5 - 5.1 mmol/L Final  . Chloride 04/09/2016 107  101 - 111 mmol/L Final  . CO2 04/09/2016 22  22 - 32 mmol/L Final  . Glucose, Bld 04/09/2016 209* 65 - 99 mg/dL Final  . BUN 04/09/2016 15  6 - 20 mg/dL Final  . Creatinine, Ser 04/09/2016 0.98  0.44 - 1.00 mg/dL Final  . Calcium 04/09/2016 8.9  8.9 - 10.3 mg/dL Final  . Total Protein 04/09/2016 5.8* 6.5 - 8.1 g/dL Final  . Albumin 04/09/2016 3.0* 3.5 - 5.0 g/dL Final  . AST 04/09/2016 15  15 - 41 U/L Final  . ALT 04/09/2016 11* 14 - 54 U/L Final  . Alkaline Phosphatase 04/09/2016 70  38 - 126 U/L Final  . Total Bilirubin 04/09/2016 0.7  0.3 - 1.2 mg/dL Final  . GFR calc non Af Amer 04/09/2016 53* >60 mL/min Final  . GFR calc Af Amer 04/09/2016 >60  >60 mL/min Final   Comment: (NOTE) The  eGFR has been calculated using the CKD EPI equation. This calculation has not been validated in all clinical situations. eGFR's persistently <60 mL/min signify possible Chronic Kidney Disease.   . Anion gap 04/09/2016 10  5 - 15 Final  . Prothrombin Time 04/09/2016 24.6* 11.6 - 15.2 seconds Final  . INR 04/09/2016 2.24* 0.00 - 1.49 Final  . Glucose-Capillary 04/09/2016 182* 65 - 99 mg/dL Final  . Comment 1 04/09/2016 Notify RN    Final  . Comment 2 04/09/2016 Document in Chart   Final  Admission on 04/02/2016, Discharged on 04/04/2016  Component Date Value Ref Range Status  . WBC 04/02/2016 7.2  4.0 - 10.5 K/uL Final  . RBC 04/02/2016 3.67* 3.87 - 5.11 MIL/uL Final  . Hemoglobin 04/02/2016 10.3* 12.0 - 15.0 g/dL Final  . HCT 04/02/2016 31.1* 36.0 - 46.0 % Final  . MCV 04/02/2016 84.7  78.0 - 100.0 fL Final  . MCH 04/02/2016 28.1  26.0 - 34.0 pg Final  . MCHC 04/02/2016 33.1  30.0 - 36.0 g/dL Final  . RDW 04/02/2016 13.1  11.5 - 15.5 % Final  . Platelets 04/02/2016 127* 150 - 400 K/uL Final  . Neutrophils Relative % 04/02/2016 71   Final  . Neutro Abs 04/02/2016 5.1  1.7 - 7.7 K/uL Final  . Lymphocytes Relative 04/02/2016 19   Final  . Lymphs Abs 04/02/2016 1.4  0.7 - 4.0 K/uL Final  . Monocytes Relative 04/02/2016 9   Final  . Monocytes Absolute 04/02/2016 0.6  0.1 - 1.0 K/uL Final  . Eosinophils Relative 04/02/2016 1   Final  . Eosinophils Absolute 04/02/2016 0.1  0.0 - 0.7 K/uL Final  . Basophils Relative 04/02/2016 0   Final  . Basophils Absolute 04/02/2016 0.0  0.0 - 0.1 K/uL Final  . Sodium 04/02/2016 141  135 - 145 mmol/L Final  . Potassium 04/02/2016 4.0  3.5 - 5.1 mmol/L Final  . Chloride 04/02/2016 104  101 - 111 mmol/L Final  . BUN 04/02/2016 16  6 - 20 mg/dL Final  . Creatinine, Ser 04/02/2016 0.90  0.44 - 1.00 mg/dL Final  . Glucose, Bld 04/02/2016 210* 65 - 99 mg/dL Final  . Calcium, Ion 04/02/2016 1.14  1.13 - 1.30 mmol/L Final  . TCO2 04/02/2016 22  0 - 100 mmol/L Final  . Hemoglobin 04/02/2016 10.9* 12.0 - 15.0 g/dL Final  . HCT 04/02/2016 32.0* 36.0 - 46.0 % Final  . Sodium 04/02/2016 139  135 - 145 mmol/L Final  . Potassium 04/02/2016 4.0  3.5 - 5.1 mmol/L Final  . Chloride 04/02/2016 106  101 - 111 mmol/L Final  . CO2 04/02/2016 21* 22 - 32 mmol/L Final  . Glucose, Bld 04/02/2016 213* 65 - 99 mg/dL Final  . BUN 04/02/2016 14  6 - 20 mg/dL Final  . Creatinine, Ser 04/02/2016 0.98  0.44  - 1.00 mg/dL Final  . Calcium 04/02/2016 8.5* 8.9 - 10.3 mg/dL Final  . Total Protein 04/02/2016 5.7* 6.5 - 8.1 g/dL Final  . Albumin 04/02/2016 3.3* 3.5 - 5.0 g/dL Final  . AST 04/02/2016 15  15 - 41 U/L Final  . ALT 04/02/2016 14  14 - 54 U/L Final  . Alkaline Phosphatase 04/02/2016 59  38 - 126 U/L Final  . Total Bilirubin 04/02/2016 0.6  0.3 - 1.2 mg/dL Final  . GFR calc non Af Amer 04/02/2016 53* >60 mL/min Final  . GFR calc Af Amer 04/02/2016 >60  >60 mL/min Final   Comment: (NOTE)  The eGFR has been calculated using the CKD EPI equation. This calculation has not been validated in all clinical situations. eGFR's persistently <60 mL/min signify possible Chronic Kidney Disease.   . Anion gap 04/02/2016 12  5 - 15 Final  . Prothrombin Time 04/02/2016 16.5* 11.6 - 15.2 seconds Final  . INR 04/02/2016 1.32  0.00 - 1.49 Final  . Troponin i, poc 04/02/2016 0.00  0.00 - 0.08 ng/mL Final  . Comment 3 04/02/2016          Final   Comment: Due to the release kinetics of cTnI, a negative result within the first hours of the onset of symptoms does not rule out myocardial infarction with certainty. If myocardial infarction is still suspected, repeat the test at appropriate intervals.   . ABO/RH(D) 04/02/2016 O POS   Final  . Antibody Screen 04/02/2016 NEG   Final  . Sample Expiration 04/02/2016 04/05/2016   Final  . Color, Urine 04/03/2016 YELLOW  YELLOW Final  . APPearance 04/03/2016 CLEAR  CLEAR Final  . Specific Gravity, Urine 04/03/2016 1.015  1.005 - 1.030 Final  . pH 04/03/2016 5.5  5.0 - 8.0 Final  . Glucose, UA 04/03/2016 NEGATIVE  NEGATIVE mg/dL Final  . Hgb urine dipstick 04/03/2016 NEGATIVE  NEGATIVE Final  . Bilirubin Urine 04/03/2016 NEGATIVE  NEGATIVE Final  . Ketones, ur 04/03/2016 NEGATIVE  NEGATIVE mg/dL Final  . Protein, ur 04/03/2016 NEGATIVE  NEGATIVE mg/dL Final  . Nitrite 04/03/2016 NEGATIVE  NEGATIVE Final  . Leukocytes, UA 04/03/2016 NEGATIVE  NEGATIVE Final    MICROSCOPIC NOT DONE ON URINES WITH NEGATIVE PROTEIN, BLOOD, LEUKOCYTES, NITRITE, OR GLUCOSE <1000 mg/dL.  Marland Kitchen D-Dimer, Quant 04/02/2016 3.31* 0.00 - 0.50 ug/mL-FEU Final   Comment: (NOTE) At the manufacturer cut-off of 0.50 ug/mL FEU, this assay has been documented to exclude PE with a sensitivity and negative predictive value of 97 to 99%.  At this time, this assay has not been approved by the FDA to exclude DVT/VTE. Results should be correlated with clinical presentation.   . ABO/RH(D) 04/02/2016 O POS   Final  . Glucose-Capillary 04/02/2016 165* 65 - 99 mg/dL Final  . Weight 04/03/2016 1896   Final  . Height 04/03/2016 63   Final  . BP 04/03/2016 120/59   Final  . Sodium 04/03/2016 139  135 - 145 mmol/L Final  . Potassium 04/03/2016 3.5  3.5 - 5.1 mmol/L Final  . Chloride 04/03/2016 106  101 - 111 mmol/L Final  . CO2 04/03/2016 20* 22 - 32 mmol/L Final  . Glucose, Bld 04/03/2016 173* 65 - 99 mg/dL Final  . BUN 04/03/2016 14  6 - 20 mg/dL Final  . Creatinine, Ser 04/03/2016 1.08* 0.44 - 1.00 mg/dL Final  . Calcium 04/03/2016 8.3* 8.9 - 10.3 mg/dL Final  . Total Protein 04/03/2016 5.2* 6.5 - 8.1 g/dL Final  . Albumin 04/03/2016 2.9* 3.5 - 5.0 g/dL Final  . AST 04/03/2016 14* 15 - 41 U/L Final  . ALT 04/03/2016 13* 14 - 54 U/L Final  . Alkaline Phosphatase 04/03/2016 52  38 - 126 U/L Final  . Total Bilirubin 04/03/2016 0.7  0.3 - 1.2 mg/dL Final  . GFR calc non Af Amer 04/03/2016 48* >60 mL/min Final  . GFR calc Af Amer 04/03/2016 55* >60 mL/min Final   Comment: (NOTE) The eGFR has been calculated using the CKD EPI equation. This calculation has not been validated in all clinical situations. eGFR's persistently <60 mL/min signify possible Chronic Kidney Disease.   Marland Kitchen  Anion gap 04/03/2016 13  5 - 15 Final  . WBC 04/03/2016 7.0  4.0 - 10.5 K/uL Final  . RBC 04/03/2016 3.44* 3.87 - 5.11 MIL/uL Final  . Hemoglobin 04/03/2016 9.6* 12.0 - 15.0 g/dL Final  . HCT 04/03/2016 29.4* 36.0 -  46.0 % Final  . MCV 04/03/2016 85.5  78.0 - 100.0 fL Final  . MCH 04/03/2016 27.9  26.0 - 34.0 pg Final  . MCHC 04/03/2016 32.7  30.0 - 36.0 g/dL Final  . RDW 04/03/2016 13.3  11.5 - 15.5 % Final  . Platelets 04/03/2016 107* 150 - 400 K/uL Final   CONSISTENT WITH PREVIOUS RESULT  . Prothrombin Time 04/03/2016 30.0* 11.6 - 15.2 seconds Final  . INR 04/03/2016 2.92* 0.00 - 1.49 Final  . B Natriuretic Peptide 04/03/2016 204.8* 0.0 - 100.0 pg/mL Final  . Magnesium 04/03/2016 1.0* 1.7 - 2.4 mg/dL Final  . Glucose-Capillary 04/03/2016 181* 65 - 99 mg/dL Final  . Hgb A1c MFr Bld 04/03/2016 7.9* 4.8 - 5.6 % Final   Comment: (NOTE)         Pre-diabetes: 5.7 - 6.4         Diabetes: >6.4         Glycemic control for adults with diabetes: <7.0   . Mean Plasma Glucose 04/03/2016 180   Final   Comment: (NOTE) Performed At: [facility] [place], [place] [id number] Lindon Romp MD EH:[id number]   . Glucose-Capillary 04/03/2016 211* 65 - 99 mg/dL Final  . Comment 1 04/03/2016 Notify RN   Final  . Comment 2 04/03/2016 Document in Chart   Final  . Glucose-Capillary 04/03/2016 193* 65 - 99 mg/dL Final  . Comment 1 04/03/2016 Notify RN   Final  . Comment 2 04/03/2016 Document in Chart   Final  . Glucose-Capillary 04/03/2016 165* 65 - 99 mg/dL Final  . Glucose-Capillary 04/04/2016 183* 65 - 99 mg/dL Final  . Glucose-Capillary 04/04/2016 189* 65 - 99 mg/dL Final  . Comment 1 04/04/2016 Notify RN   Final  . Comment 2 04/04/2016 Document in Chart   Final    Dg Chest 2 View  04/02/2016  CLINICAL DATA:  Shortness of breath and syncope. EXAM: CHEST - 2 VIEW COMPARISON:  12/19/2015 FINDINGS: The heart size and mediastinal contours are within normal limits. Stable probable chronic lung disease with suggestion of mild bilateral pulmonary hyperinflation. There is no evidence of pulmonary edema, consolidation, pneumothorax, nodule or pleural fluid. The thoracic spine shows osteopenia  and spondylosis without visible fracture. IMPRESSION: No active disease.  Chronic lung disease.  No acute findings. Electronically Signed   By: Aletta Edouard M.D.   On: 04/02/2016 18:17   Dg Wrist Complete Left  03/15/2016  CLINICAL DATA:  New lifting injury of the wrist with a popping sensation 6 days ago. Continued lateral wrist pain EXAM: LEFT WRIST - COMPLETE 3+ VIEW COMPARISON:  02/14/2011 FINDINGS: Deformity from prior fifth metacarpal shaft fracture. New linear calcification in the expected location of the TFCC disc favoring chondrocalcinosis. Mild spurring of the distal pole of the scaphoid. Chondral thinning in the carpus leads to mild carpal crowding. There is some ridging of the midportion and distal pole of the scaphoid. Upper normal with of the scapholunate distance. IMPRESSION: 1. I do not see an acute fracture. 2. Spurring in the scaphoid. 3. There is new chondrocalcinosis of the TFCC, raising the possibility of CPPD arthropathy. 4. If pain persists despite conservative therapy, MRI may be warranted for  further characterization. Electronically Signed   By: Van Clines M.D.   On: 03/15/2016 08:19   Ct Head Wo Contrast  04/02/2016  CLINICAL DATA:  Recent fall with facial injury EXAM: CT HEAD WITHOUT CONTRAST CT MAXILLOFACIAL WITHOUT CONTRAST TECHNIQUE: Multidetector CT imaging of the head and maxillofacial structures were performed using the standard protocol without intravenous contrast. Multiplanar CT image reconstructions of the maxillofacial structures were also generated. COMPARISON:  10/02/2012 FINDINGS: CT HEAD FINDINGS The bony calvarium is intact. Mild atrophic changes are noted commenced with the patient's given age. No findings to suggest acute hemorrhage, acute infarction or space-occupying mass lesion are noted. CT MAXILLOFACIAL FINDINGS Bony structures of the face show no acute fracture. Prior surgical changes are noted posterior to the right zygomatic arch. The orbits and  their contents are within normal limits. The paranasal sinuses are unremarkable. Soft tissue swelling is noted below the nose centrally and eccentric to the right consistent with the recent injury. Degenerative changes of the temporomandibular joints are seen. No blowout fracture is identified. The visualized cervical spine shows degenerative change. No acute for abnormality is noted. IMPRESSION: CT of the head:  No acute intracranial abnormality noted. CT of the maxillofacial bones: No acute bony abnormality is noted. Soft tissue swelling over the right upper lip is seen consistent with the recent injury. Electronically Signed   By: Inez Catalina M.D.   On: 04/02/2016 19:11   Ct Angio Chest Pe W/cm &/or Wo Cm  04/03/2016  CLINICAL DATA:  Syncope and shortness of breath.  Elevated D-dimer. EXAM: CT ANGIOGRAPHY CHEST WITH CONTRAST TECHNIQUE: Multidetector CT imaging of the chest was performed using the standard protocol during bolus administration of intravenous contrast. Multiplanar CT image reconstructions and MIPs were obtained to evaluate the vascular anatomy. CONTRAST:  100 mL Isovue 370 IV COMPARISON:  Chest radiograph yesterday. Chest CT 04/18/2015, 03/13/2012 FINDINGS: Mediastinum/Lymph Nodes: No pulmonary emboli. Tortuous atherosclerotic aorta without aneurysm. Cannot assess for dissection given phase of contrast. No masses or pathologically enlarged lymph nodes identified. No pericardial effusion. Coronary artery calcifications. Lungs/Pleura: Mild emphysema. No consolidation. Multiple pulmonary nodules. Right middle lobe pulmonary nodule measures 1.4 x 1.1 cm image 62 series 6 unchanged adjacent 9 mm right middle lobe nodule image 58. Dominant right lower lobe nodule measures 1.7 x 1.6 cm image 62. Additional smaller right lower lobe pulmonary nodules are stable. Multiple small left lower lobe pulmonary nodules are unchanged. No new nodule. No pleural effusion. Upper abdomen: No acute findings.  Musculoskeletal: No chest wall mass or suspicious bone lesions identified. Prominent Schmorl's node lower thoracic vertebra. Review of the MIP images confirms the above findings. IMPRESSION: 1. No pulmonary embolus. 2. No acute process. 3. Multiple bilateral pulmonary nodules, unchanged dating back to 2013 and considered benign. 4. Atherosclerosis and coronary artery calcifications. Electronically Signed   By: Jeb Levering M.D.   On: 04/03/2016 02:13   Dg Chest Portable 1 View  04/07/2016  CLINICAL DATA:  Left-sided chest pain. Multiple recent falls including 1 fall yesterday. Left lower rib pain. EXAM: PORTABLE CHEST 1 VIEW COMPARISON:  Chest CT dated 04/03/2016 and chest x-rays dated 04/02/2016 and 12/19/2015 FINDINGS: There are multiple old healed left posterior lateral rib fractures. Diffuse osteopenia. Chronic borderline cardiomegaly. Pulmonary vascularity is normal and the lungs are clear although somewhat hyperinflated suggesting emphysema. Calcification in the thoracic aorta. No acute bone abnormality. IMPRESSION: No acute abnormality. Borderline cardiomegaly. Emphysema. Multiple old left rib fractures. Osteopenia. Electronically Signed   By: Lorriane Shire  M.D.   On: 04/07/2016 15:02   Dg Hand Complete Left  03/15/2016  CLINICAL DATA:  "Pop" in left wrist.  Pain. EXAM: LEFT HAND - COMPLETE 3+ VIEW COMPARISON:  None. FINDINGS: Degenerative changes in the fingers. No wrist abnormalities identified. IMPRESSION: Degenerative changes in the hand. Electronically Signed   By: Dorise Bullion III M.D   On: 03/15/2016 09:30   Ct Maxillofacial Wo Cm  04/02/2016  CLINICAL DATA:  Recent fall with facial injury EXAM: CT HEAD WITHOUT CONTRAST CT MAXILLOFACIAL WITHOUT CONTRAST TECHNIQUE: Multidetector CT imaging of the head and maxillofacial structures were performed using the standard protocol without intravenous contrast. Multiplanar CT image reconstructions of the maxillofacial structures were also  generated. COMPARISON:  10/02/2012 FINDINGS: CT HEAD FINDINGS The bony calvarium is intact. Mild atrophic changes are noted commenced with the patient's given age. No findings to suggest acute hemorrhage, acute infarction or space-occupying mass lesion are noted. CT MAXILLOFACIAL FINDINGS Bony structures of the face show no acute fracture. Prior surgical changes are noted posterior to the right zygomatic arch. The orbits and their contents are within normal limits. The paranasal sinuses are unremarkable. Soft tissue swelling is noted below the nose centrally and eccentric to the right consistent with the recent injury. Degenerative changes of the temporomandibular joints are seen. No blowout fracture is identified. The visualized cervical spine shows degenerative change. No acute for abnormality is noted. IMPRESSION: CT of the head:  No acute intracranial abnormality noted. CT of the maxillofacial bones: No acute bony abnormality is noted. Soft tissue swelling over the right upper lip is seen consistent with the recent injury. Electronically Signed   By: Inez Catalina M.D.   On: 04/02/2016 19:11     Assessment/Plan   ICD-9-CM ICD-10-CM   1. Paroxysmal atrial fibrillation (HCC) 427.31 I48.0   2. Physical deconditioning 799.3 R53.81   3. Diabetes mellitus with complication (HCC) 657.90 E11.8   4. Essential hypertension 401.9 I10   5. Thrombocytopenia (HCC) 287.5 D69.6   6. Coronary artery disease with unspecified angina pectoris 414.00 I25.119    413.9    7. Hypothyroidism due to acquired atrophy of thyroid 244.8 E03.8    246.8 E03.4   8. Anemia, unspecified anemia type 285.9 D64.9   9. Frequent falls V15.88 R29.6   10. Syncope and collapse 780.2 R55   11.    electroltye disturbance  Start toujeo 6 units subcut qhs. Cont SSI  T/c adding B12 supplement due to low B12 for age  Cont current meds as ordered  F/u with cardiology as scheduled  Check CMP, Mg level in AM  Check TSH and free T4 in  4 weeks  Fall precautions  Use incentive spirometry 10x qshift   PT/OT/ST as ordered  GOAL: short term rehab and d/c home when medically appropriate. Communicated with pt and nursing.  Will follow  Pavel Gadd S. Perlie Gold  [facility] [address] [place], [place] [address] [phone] Cell (Monday-Friday 8 AM - 5 PM) [phone] After 5 PM and follow prompts

## 2016-05-09 DIAGNOSIS — R55 Syncope and collapse: Secondary | ICD-10-CM | POA: Diagnosis not present

## 2016-05-15 ENCOUNTER — Non-Acute Institutional Stay (SKILLED_NURSING_FACILITY): Payer: Medicare Other | Admitting: Adult Health

## 2016-05-15 ENCOUNTER — Encounter: Payer: Self-pay | Admitting: Adult Health

## 2016-05-15 ENCOUNTER — Encounter: Payer: Self-pay | Admitting: Internal Medicine

## 2016-05-15 DIAGNOSIS — E1165 Type 2 diabetes mellitus with hyperglycemia: Secondary | ICD-10-CM | POA: Diagnosis not present

## 2016-05-15 DIAGNOSIS — R55 Syncope and collapse: Secondary | ICD-10-CM

## 2016-05-15 DIAGNOSIS — R0602 Shortness of breath: Secondary | ICD-10-CM | POA: Diagnosis not present

## 2016-05-15 DIAGNOSIS — IMO0002 Reserved for concepts with insufficient information to code with codable children: Secondary | ICD-10-CM

## 2016-05-15 DIAGNOSIS — E1129 Type 2 diabetes mellitus with other diabetic kidney complication: Secondary | ICD-10-CM | POA: Diagnosis not present

## 2016-05-15 DIAGNOSIS — I480 Paroxysmal atrial fibrillation: Secondary | ICD-10-CM

## 2016-05-15 NOTE — Progress Notes (Signed)
Location:  [facility] Room Number: 120 Place of Service:  SNF (31)  FULL CODE  Allergies  Allergen Reactions  . Codeine Nausea And Vomiting  . Hydrocodone-Acetaminophen Nausea And Vomiting    Chief Complaint  Patient presents with  . Discharge Note    HPI:  She is being discharged to home with home health for pt/ot. She will need 3:1 commode. She will need her prescriptions written and will need to follow up with her medical provider.  She had been hospitalized for syncope. She was admitted to this facility for short term rehab and now ready to be discharged to home.  123  Past Medical History  Diagnosis Date  . Lung cancer ([place])   . Stroke ([place])   . Diabetes mellitus   . CAD (coronary artery disease) 03/13/2012  . Type II or unspecified type diabetes mellitus without mention of complication, not stated as uncontrolled 03/13/2012  . Hypothyroid 03/13/2012  . HTN (hypertension) 03/13/2012  . Dysrhythmia     ATRIAL FIB  . Shortness of breath   . Syncope and collapse 04/02/2016    Past Surgical History  Procedure Laterality Date  . Cardioversion  04/20/2012    Procedure: CARDIOVERSION;  Surgeon: Laverda Page, MD;  Location: [place];  Service: Cardiovascular;  Laterality: N/A;  . Abdominal hysterectomy    . Tumors      back of head  . Hemorrhoid surgery    . Jaw replacement    . Cholecystectomy    . Breast surgery      benign tumors removed in 1980  . Cardiac catheterization    . Cardioversion N/A 05/10/2013    Procedure: CARDIOVERSION;  Surgeon: Laverda Page, MD;  Location: [facility] [place];  Service: Cardiovascular;  Laterality: N/A;  . Left heart catheterization with coronary angiogram N/A 04/20/2015    Procedure: LEFT HEART CATHETERIZATION WITH CORONARY ANGIOGRAM;  Surgeon: Adrian Prows, MD;  Location: [facility] CATH LAB;  Service: Cardiovascular;  Laterality: N/A;    Social History   Social History  . Marital Status: Married    Spouse  Name: N/A  . Number of Children: N/A  . Years of Education: N/A   Occupational History  . Not on file.   Social History Main Topics  . Smoking status: Never Smoker   . Smokeless tobacco: Never Used  . Alcohol Use: No  . Drug Use: No  . Sexual Activity: Not on file   Other Topics Concern  . Not on file   Social History Narrative   Family History  Problem Relation Age of Onset  . Breast cancer Mother   . Aneurysm Father     Likely thoracic aneurysm per patient  . Melanoma Brother   . Heart disease Sister   . Diabetes type II Sister     VITAL SIGNS BP 103/64 mmHg  Pulse 77  Temp(Src) 97.5 F (36.4 C) (Oral)  Resp 22  Ht '5\' 3"'$  (1.6 m)  Wt 129 lb 3.2 oz (58.605 kg)  BMI 22.89 kg/m2  Patient's Medications  New Prescriptions   No medications on file  Previous Medications   ACETAMINOPHEN (TYLENOL) 500 MG TABLET    Take 500 mg by mouth every 6 (six) hours as needed for moderate pain or headache.    ATORVASTATIN (LIPITOR) 40 MG TABLET    Take 1 tablet (40 mg total) by mouth daily.   CALCIUM CARB-CHOLECALCIFEROL (CALCIUM-VITAMIN D3) 600-500 MG-UNIT CAPS    Take 1 capsule  by mouth daily.   COLESEVELAM (WELCHOL) 625 MG TABLET    Take 625 mg by mouth daily.    DILTIAZEM (CARDIZEM CD) 180 MG 24 HR CAPSULE    Take 1 capsule (180 mg total) by mouth daily.   HYDROXYZINE (VISTARIL) 25 MG CAPSULE    Take 25 mg by mouth at bedtime.   INSULIN ASPART (NOVOLOG) 100 UNIT/ML INJECTION    Check FSBS before each meal for DM. Inject SQ < 150 =0 u; 151-200 = 2 U; 201-250 = 4 U; 251-300 = 6 U; 301-350 = 8 U; 351-400 = 10 U; >/= 401 =12 U.   INSULIN GLARGINE (TOUJEO SOLOSTAR) 300 UNIT/ML SOPN    Inject 10 Units into the skin at bedtime.   ISOSORBIDE MONONITRATE (IMDUR) 60 MG 24 HR TABLET    Take 1 tablet (60 mg total) by mouth daily.   LEVOTHYROXINE (SYNTHROID, LEVOTHROID) 100 MCG TABLET    Take 1 tablet every morning (6:30 am) on an empty stomach.   MAG OXIDE-VIT D3-TURMERIC (MAGNESIUM-VITAMIN  D3-TURMERIC PO)    Take 1 tablet by mouth daily.   MAGNESIUM OXIDE (MAG-OX) 400 (241.3 MG) MG TABLET    Take 1 tablet (400 mg total) by mouth 2 (two) times daily.   MECLIZINE (ANTIVERT) 25 MG TABLET    Take 25 mg by mouth daily as needed for dizziness.    MULTIPLE VITAMINS-MINERALS (WOMENS BONE HEALTH PO)    Take 1 tablet by mouth daily.   POLYETHYL GLYCOL-PROPYL GLYCOL (SYSTANE) 0.4-0.3 % SOLN    Apply 2 drops to eye 2 (two) times daily as needed (dry eyes).   POLYVINYL ALCOHOL (LIQUIFILM TEARS) 1.4 % OPHTHALMIC SOLUTION    Place 2 drops into both eyes as needed for dry eyes.    PROMETHAZINE (PHENERGAN) 25 MG/ML INJECTION    Inject 57m = 25 mg IM every 6 hours as needed if unable to give suppository for 3 doses.   RIVAROXABAN (XARELTO) 15 MG TABS TABLET    Take 15 mg by mouth daily with supper.   SOTALOL (BETAPACE) 120 MG TABLET    Take 1 tablet (120 mg total) by mouth 2 (two) times daily.   TRAMADOL (ULTRAM) 50 MG TABLET    Take 1 tablet (50 mg total) by mouth every 6 (six) hours as needed.   TRIAMCINOLONE (KENALOG) 0.025 % OINTMENT    Apply 1 application topically 2 (two) times daily. Do not apply to face  Modified Medications   No medications on file  Discontinued Medications   No medications on file     SIGNIFICANT DIAGNOSTIC EXAMS  LABS REVIEWED:   04-09-16: glucose 209; bun 15; creat 0.98; k+ 5.0; na++139 liver normal albumin 3.0   Review of Systems  Constitutional: Negative for malaise/fatigue.  Respiratory: Negative for cough and shortness of breath.   Cardiovascular: Negative for chest pain, palpitations and leg swelling.  Gastrointestinal: Negative for heartburn, abdominal pain and constipation.  Musculoskeletal: Negative for myalgias, back pain and joint pain.  Skin: Negative.   Neurological: Negative for dizziness.  Psychiatric/Behavioral: The patient is not nervous/anxious.      Physical Exam  Constitutional: She is oriented to person, place, and time. No distress.    Eyes: Conjunctivae are normal.  Neck: Neck supple. No JVD present. No thyromegaly present.  Cardiovascular: Normal rate, regular rhythm and intact distal pulses.   Respiratory: Effort normal and breath sounds normal. No respiratory distress. She has no wheezes.  GI: Soft. Bowel sounds are normal. She exhibits no  distension. There is no tenderness.  Musculoskeletal: She exhibits no edema.  Able to move all extremities   Lymphadenopathy:    She has no cervical adenopathy.  Neurological: She is alert and oriented to person, place, and time.  Skin: Skin is warm and dry. She is not diaphoretic.  Psychiatric: She has a normal mood and affect.      ASSESSMENT/ PLAN:   Patient is being discharged with the following home health services:  Pt/ot to evaluate and treat as indicated for gait; balance; strength; adl training.   Patient is being discharged with the following durable medical equipment:  3:1 commode  She has been on chronic 02 therapy prior to her hospitalization   Patient has been advised to f/u with their PCP in 1-2 weeks to bring them up to date on their rehab stay.  Social services at facility was responsible for arranging this appointment.  Pt was provided with a 30 day supply of prescriptions for medications and refills must be obtained from their PCP.  For controlled substances, a more limited supply may be provided adequate until PCP appointment only.  #15 ultam 50 mg tabs      Time spent with patient  40   minutes >50% time spent counseling; reviewing medical record; tests; labs; and developing future plan of care    Ok Edwards NP [facility]  Contact [phone] Monday through Friday 8am- 5pm  After hours call [phone]

## 2016-05-15 NOTE — Progress Notes (Signed)
DATE:  Location:  [facility] Room Number: 120 Place of Service: SNF (31)   Extended Emergency Contact Information Primary Emergency Contact: Reen,James Address: [place], [place] [address] [place] Phone: [phone] Relation: Spouse  Advanced Directive information Does patient have an advance directive?: No, Would patient like information on creating an advanced directive?: No - patient declined information  Chief Complaint  Patient presents with  . Discharge Note    HPI:    Past Medical History  Diagnosis Date  . Lung cancer ([place])   . Stroke ([place])   . Diabetes mellitus   . CAD (coronary artery disease) 03/13/2012  . Type II or unspecified type diabetes mellitus without mention of complication, not stated as uncontrolled 03/13/2012  . Hypothyroid 03/13/2012  . HTN (hypertension) 03/13/2012  . Dysrhythmia     ATRIAL FIB  . Shortness of breath   . Syncope and collapse 04/02/2016    Past Surgical History  Procedure Laterality Date  . Cardioversion  04/20/2012    Procedure: CARDIOVERSION;  Surgeon: Laverda Page, MD;  Location: [place];  Service: Cardiovascular;  Laterality: N/A;  . Abdominal hysterectomy    . Tumors      back of head  . Hemorrhoid surgery    . Jaw replacement    . Cholecystectomy    . Breast surgery      benign tumors removed in 1980  . Cardiac catheterization    . Cardioversion N/A 05/10/2013    Procedure: CARDIOVERSION;  Surgeon: Laverda Page, MD;  Location: [place];  Service: Cardiovascular;  Laterality: N/A;  . Left heart catheterization with coronary angiogram N/A 04/20/2015    Procedure: LEFT HEART CATHETERIZATION WITH CORONARY ANGIOGRAM;  Surgeon: Adrian Prows, MD;  Location: [facility] CATH LAB;  Service: Cardiovascular;  Laterality: N/A;    Patient Care Team: Anda Kraft, MD as PCP - General (Endocrinology)  Social History   Social History  . Marital Status:  Married    Spouse Name: N/A  . Number of Children: N/A  . Years of Education: N/A   Occupational History  . Not on file.   Social History Main Topics  . Smoking status: Never Smoker   . Smokeless tobacco: Never Used  . Alcohol Use: No  . Drug Use: No  . Sexual Activity: Not on file   Other Topics Concern  . Not on file   Social History Narrative     reports that she has never smoked. She has never used smokeless tobacco. She reports that she does not drink alcohol or use illicit drugs.  Immunization History  Administered Date(s) Administered  . PPD Test 04/09/2016    Allergies  Allergen Reactions  . Codeine Nausea And Vomiting  . Hydrocodone-Acetaminophen Nausea And Vomiting    Medications: Patient's Medications  New Prescriptions   No medications on file  Previous Medications   ACETAMINOPHEN (TYLENOL) 500 MG TABLET    Take 500 mg by mouth every 6 (six) hours as needed for moderate pain or headache.    ATORVASTATIN (LIPITOR) 40 MG TABLET    Take 1 tablet (40 mg total) by mouth daily.   CALCIUM CARB-CHOLECALCIFEROL (CALCIUM-VITAMIN D3) 600-500 MG-UNIT CAPS    Take 1 capsule by mouth daily.   COLESEVELAM (WELCHOL) 625 MG TABLET    Take 625 mg by mouth daily.    DILTIAZEM (CARDIZEM CD) 180 MG 24  HR CAPSULE    Take 1 capsule (180 mg total) by mouth daily.   HYDROXYZINE (VISTARIL) 25 MG CAPSULE    Take 25 mg by mouth at bedtime.   INSULIN ASPART (NOVOLOG FLEXPEN [place])    Give 3 units SQ every morning if CBG > 150   INSULIN ASPART (NOVOLOG) 100 UNIT/ML INJECTION    Check FSBS before each meal for DM. Inject SQ < 150 =0 u; 151-200 = 2 U; 201-250 = 4 U; 251-300 = 6 U; 301-350 = 8 U; 351-400 = 10 U; >/= 401 =12 U.   INSULIN GLARGINE (TOUJEO SOLOSTAR) 300 UNIT/ML SOPN    Inject 10 Units into the skin at bedtime.   ISOSORBIDE MONONITRATE (IMDUR) 60 MG 24 HR TABLET    Take 1 tablet (60 mg total) by mouth daily.   LEVOTHYROXINE (SYNTHROID, LEVOTHROID) 100 MCG TABLET    Take 1 tablet  every morning (6:30 am) on an empty stomach.   MAG OXIDE-VIT D3-TURMERIC (MAGNESIUM-VITAMIN D3-TURMERIC PO)    Take 1 tablet by mouth daily.   MAGNESIUM OXIDE (MAG-OX) 400 (241.3 MG) MG TABLET    Take 1 tablet (400 mg total) by mouth 2 (two) times daily.   MECLIZINE (ANTIVERT) 25 MG TABLET    Take 25 mg by mouth daily as needed for dizziness.    MULTIPLE VITAMINS-MINERALS (WOMENS BONE HEALTH PO)    Take 1 tablet by mouth daily.   POLYETHYL GLYCOL-PROPYL GLYCOL (SYSTANE) 0.4-0.3 % SOLN    Apply 2 drops to eye 2 (two) times daily as needed (dry eyes).   POLYVINYL ALCOHOL (LIQUIFILM TEARS) 1.4 % OPHTHALMIC SOLUTION    Place 2 drops into both eyes as needed for dry eyes.    PROMETHAZINE (PHENERGAN) 25 MG/ML INJECTION    Inject 26m = 25 mg IM every 6 hours as needed if unable to give suppository for 3 doses.   RIVAROXABAN (XARELTO) 15 MG TABS TABLET    Take 15 mg by mouth daily with supper.   SOTALOL (BETAPACE) 120 MG TABLET    Take 1 tablet (120 mg total) by mouth 2 (two) times daily.   TRAMADOL (ULTRAM) 50 MG TABLET    Take 1 tablet (50 mg total) by mouth every 6 (six) hours as needed.   TRIAMCINOLONE (KENALOG) 0.025 % OINTMENT    Apply 1 application topically 2 (two) times daily. Do not apply to face  Modified Medications   No medications on file  Discontinued Medications   CLOBETASOL (TEMOVATE) 0.05 % EXTERNAL SOLUTION    Apply 1 application topically 2 (two) times daily.   CLOBETASOL CREAM (TEMOVATE) 0.05 %    Apply 1 application topically 2 (two) times daily.   LEVOTHYROXINE (SYNTHROID, LEVOTHROID) 100 MCG TABLET    Take 1 tablet (100 mcg total) by mouth daily.    Review of Systems  Filed Vitals:   05/15/16 0843  BP: 103/64  Pulse: 77  Temp: 97.5 F (36.4 C)  TempSrc: Oral  Resp: 22  Height: 5' 3" (1.6 m)  Weight: 129 lb 3.2 oz (58.605 kg)   Body mass index is 22.89 kg/(m^2).  Physical Exam   Labs reviewed: Admission on 04/07/2016, Discharged on 04/09/2016  Component Date  Value Ref Range Status  . Sodium 04/07/2016 144  135 - 145 mmol/L Final  . Potassium 04/07/2016 3.4* 3.5 - 5.1 mmol/L Final  . Chloride 04/07/2016 111  101 - 111 mmol/L Final  . CO2 04/07/2016 20* 22 - 32 mmol/L Final  . Glucose, Bld  04/07/2016 154* 65 - 99 mg/dL Final  . BUN 04/07/2016 9  6 - 20 mg/dL Final  . Creatinine, Ser 04/07/2016 0.79  0.44 - 1.00 mg/dL Final  . Calcium 04/07/2016 8.9  8.9 - 10.3 mg/dL Final  . GFR calc non Af Amer 04/07/2016 >60  >60 mL/min Final  . GFR calc Af Amer 04/07/2016 >60  >60 mL/min Final   Comment: (NOTE) The eGFR has been calculated using the CKD EPI equation. This calculation has not been validated in all clinical situations. eGFR's persistently <60 mL/min signify possible Chronic Kidney Disease.   . Anion gap 04/07/2016 13  5 - 15 Final  . WBC 04/07/2016 5.4  4.0 - 10.5 K/uL Final  . RBC 04/07/2016 3.49* 3.87 - 5.11 MIL/uL Final  . Hemoglobin 04/07/2016 9.8* 12.0 - 15.0 g/dL Final  . HCT 04/07/2016 29.3* 36.0 - 46.0 % Final  . MCV 04/07/2016 84.0  78.0 - 100.0 fL Final  . MCH 04/07/2016 28.1  26.0 - 34.0 pg Final  . MCHC 04/07/2016 33.4  30.0 - 36.0 g/dL Final  . RDW 04/07/2016 13.2  11.5 - 15.5 % Final  . Platelets 04/07/2016 129* 150 - 400 K/uL Final  . Troponin i, poc 04/07/2016 0.15* 0.00 - 0.08 ng/mL Final  . Comment 04/07/2016 NOTIFIED PHYSICIAN   Final  . Comment 3 04/07/2016          Final   Comment: Due to the release kinetics of cTnI, a negative result within the first hours of the onset of symptoms does not rule out myocardial infarction with certainty. If myocardial infarction is still suspected, repeat the test at appropriate intervals.   . Magnesium 04/07/2016 1.3* 1.7 - 2.4 mg/dL Final  . Total Protein 04/07/2016 5.8* 6.5 - 8.1 g/dL Final  . Albumin 04/07/2016 3.0* 3.5 - 5.0 g/dL Final  . AST 04/07/2016 14* 15 - 41 U/L Final  . ALT 04/07/2016 12* 14 - 54 U/L Final  . Alkaline Phosphatase 04/07/2016 63  38 - 126 U/L Final   . Total Bilirubin 04/07/2016 1.1  0.3 - 1.2 mg/dL Final  . Bilirubin, Direct 04/07/2016 0.1  0.1 - 0.5 mg/dL Final  . Indirect Bilirubin 04/07/2016 1.0* 0.3 - 0.9 mg/dL Final  . TSH 04/07/2016 0.031* 0.350 - 4.500 uIU/mL Final  . Hgb A1c MFr Bld 04/07/2016 7.9* 4.8 - 5.6 % Final   Comment: (NOTE)         Pre-diabetes: 5.7 - 6.4         Diabetes: >6.4         Glycemic control for adults with diabetes: <7.0   . Mean Plasma Glucose 04/07/2016 180   Final   Comment: (NOTE) Performed At: [facility] [place], [place] [id number] Lindon Romp MD [id number]   . Sodium 04/08/2016 141  135 - 145 mmol/L Final  . Potassium 04/08/2016 3.8  3.5 - 5.1 mmol/L Final  . Chloride 04/08/2016 109  101 - 111 mmol/L Final  . CO2 04/08/2016 22  22 - 32 mmol/L Final  . Glucose, Bld 04/08/2016 178* 65 - 99 mg/dL Final  . BUN 04/08/2016 10  6 - 20 mg/dL Final  . Creatinine, Ser 04/08/2016 0.81  0.44 - 1.00 mg/dL Final  . Calcium 04/08/2016 8.5* 8.9 - 10.3 mg/dL Final  . GFR calc non Af Amer 04/08/2016 >60  >60 mL/min Final  . GFR calc Af Amer 04/08/2016 >60  >60 mL/min Final   Comment: (NOTE) The eGFR has  been calculated using the CKD EPI equation. This calculation has not been validated in all clinical situations. eGFR's persistently <60 mL/min signify possible Chronic Kidney Disease.   . Anion gap 04/08/2016 10  5 - 15 Final  . WBC 04/08/2016 4.8  4.0 - 10.5 K/uL Final  . RBC 04/08/2016 3.29* 3.87 - 5.11 MIL/uL Final  . Hemoglobin 04/08/2016 9.2* 12.0 - 15.0 g/dL Final  . HCT 04/08/2016 27.9* 36.0 - 46.0 % Final  . MCV 04/08/2016 84.8  78.0 - 100.0 fL Final  . MCH 04/08/2016 28.0  26.0 - 34.0 pg Final  . MCHC 04/08/2016 33.0  30.0 - 36.0 g/dL Final  . RDW 04/08/2016 13.3  11.5 - 15.5 % Final  . Platelets 04/08/2016 121* 150 - 400 K/uL Final  . Troponin I 04/07/2016 0.14* <0.031 ng/mL Final   Comment:        PERSISTENTLY INCREASED TROPONIN VALUES IN THE RANGE OF  0.04-0.49 ng/mL CAN BE SEEN IN:       -UNSTABLE ANGINA       -CONGESTIVE HEART FAILURE       -MYOCARDITIS       -CHEST TRAUMA       -ARRYHTHMIAS       -LATE PRESENTING MYOCARDIAL INFARCTION       -COPD   CLINICAL FOLLOW-UP RECOMMENDED.   Marland Kitchen Troponin I 04/08/2016 0.10* <0.031 ng/mL Final   Comment:        PERSISTENTLY INCREASED TROPONIN VALUES IN THE RANGE OF 0.04-0.49 ng/mL CAN BE SEEN IN:       -UNSTABLE ANGINA       -CONGESTIVE HEART FAILURE       -MYOCARDITIS       -CHEST TRAUMA       -ARRYHTHMIAS       -LATE PRESENTING MYOCARDIAL INFARCTION       -COPD   CLINICAL FOLLOW-UP RECOMMENDED.   . Vitamin B-12 04/07/2016 264  180 - 914 pg/mL Final   Comment: (NOTE) This assay is not validated for testing neonatal or myeloproliferative syndrome specimens for Vitamin B12 levels.   . Folate 04/07/2016 21.8  >5.9 ng/mL Final  . Iron 04/07/2016 18* 28 - 170 ug/dL Final  . TIBC 04/07/2016 245* 250 - 450 ug/dL Final  . Saturation Ratios 04/07/2016 7* 10.4 - 31.8 % Final  . UIBC 04/07/2016 227   Final  . Ferritin 04/07/2016 159  11 - 307 ng/mL Final  . Retic Ct Pct 04/07/2016 1.8  0.4 - 3.1 % Final  . RBC. 04/07/2016 3.49* 3.87 - 5.11 MIL/uL Final  . Retic Count, Manual 04/07/2016 62.8  19.0 - 186.0 K/uL Final  . Glucose-Capillary 04/07/2016 270* 65 - 99 mg/dL Final  . Glucose-Capillary 04/08/2016 174* 65 - 99 mg/dL Final  . T3, Free 04/08/2016 1.7* 2.0 - 4.4 pg/mL Final   Comment: (NOTE) Performed At: [facility] [place], [place] [id number] Lindon Romp MD [id number]   . Free T4 04/08/2016 1.27* 0.61 - 1.12 ng/dL Final  . Glucose-Capillary 04/08/2016 265* 65 - 99 mg/dL Final  . Comment 1 04/08/2016 Notify RN   Final  . Glucose-Capillary 04/08/2016 119* 65 - 99 mg/dL Final  . Comment 1 04/08/2016 Notify RN   Final  . Glucose-Capillary 04/08/2016 235* 65 - 99 mg/dL Final  . Comment 1 04/08/2016 Notify RN   Final  . Comment 2 04/08/2016 Document  in Chart   Final  . Glucose-Capillary 04/09/2016 195* 65 - 99 mg/dL Final  . Comment  1 04/09/2016 Notify RN   Final  . Comment 2 04/09/2016 Document in Chart   Final  . Magnesium 04/09/2016 1.2* 1.7 - 2.4 mg/dL Final  . Sodium 04/09/2016 139  135 - 145 mmol/L Final  . Potassium 04/09/2016 5.0  3.5 - 5.1 mmol/L Final  . Chloride 04/09/2016 107  101 - 111 mmol/L Final  . CO2 04/09/2016 22  22 - 32 mmol/L Final  . Glucose, Bld 04/09/2016 209* 65 - 99 mg/dL Final  . BUN 04/09/2016 15  6 - 20 mg/dL Final  . Creatinine, Ser 04/09/2016 0.98  0.44 - 1.00 mg/dL Final  . Calcium 04/09/2016 8.9  8.9 - 10.3 mg/dL Final  . Total Protein 04/09/2016 5.8* 6.5 - 8.1 g/dL Final  . Albumin 04/09/2016 3.0* 3.5 - 5.0 g/dL Final  . AST 04/09/2016 15  15 - 41 U/L Final  . ALT 04/09/2016 11* 14 - 54 U/L Final  . Alkaline Phosphatase 04/09/2016 70  38 - 126 U/L Final  . Total Bilirubin 04/09/2016 0.7  0.3 - 1.2 mg/dL Final  . GFR calc non Af Amer 04/09/2016 53* >60 mL/min Final  . GFR calc Af Amer 04/09/2016 >60  >60 mL/min Final   Comment: (NOTE) The eGFR has been calculated using the CKD EPI equation. This calculation has not been validated in all clinical situations. eGFR's persistently <60 mL/min signify possible Chronic Kidney Disease.   . Anion gap 04/09/2016 10  5 - 15 Final  . Prothrombin Time 04/09/2016 24.6* 11.6 - 15.2 seconds Final  . INR 04/09/2016 2.24* 0.00 - 1.49 Final  . Glucose-Capillary 04/09/2016 182* 65 - 99 mg/dL Final  . Comment 1 04/09/2016 Notify RN   Final  . Comment 2 04/09/2016 Document in Chart   Final  Admission on 04/02/2016, Discharged on 04/04/2016  Component Date Value Ref Range Status  . WBC 04/02/2016 7.2  4.0 - 10.5 K/uL Final  . RBC 04/02/2016 3.67* 3.87 - 5.11 MIL/uL Final  . Hemoglobin 04/02/2016 10.3* 12.0 - 15.0 g/dL Final  . HCT 04/02/2016 31.1* 36.0 - 46.0 % Final  . MCV 04/02/2016 84.7  78.0 - 100.0 fL Final  . MCH 04/02/2016 28.1  26.0 - 34.0 pg Final  .  MCHC 04/02/2016 33.1  30.0 - 36.0 g/dL Final  . RDW 04/02/2016 13.1  11.5 - 15.5 % Final  . Platelets 04/02/2016 127* 150 - 400 K/uL Final  . Neutrophils Relative % 04/02/2016 71   Final  . Neutro Abs 04/02/2016 5.1  1.7 - 7.7 K/uL Final  . Lymphocytes Relative 04/02/2016 19   Final  . Lymphs Abs 04/02/2016 1.4  0.7 - 4.0 K/uL Final  . Monocytes Relative 04/02/2016 9   Final  . Monocytes Absolute 04/02/2016 0.6  0.1 - 1.0 K/uL Final  . Eosinophils Relative 04/02/2016 1   Final  . Eosinophils Absolute 04/02/2016 0.1  0.0 - 0.7 K/uL Final  . Basophils Relative 04/02/2016 0   Final  . Basophils Absolute 04/02/2016 0.0  0.0 - 0.1 K/uL Final  . Sodium 04/02/2016 141  135 - 145 mmol/L Final  . Potassium 04/02/2016 4.0  3.5 - 5.1 mmol/L Final  . Chloride 04/02/2016 104  101 - 111 mmol/L Final  . BUN 04/02/2016 16  6 - 20 mg/dL Final  . Creatinine, Ser 04/02/2016 0.90  0.44 - 1.00 mg/dL Final  . Glucose, Bld 04/02/2016 210* 65 - 99 mg/dL Final  . Calcium, Ion 04/02/2016 1.14  1.13 - 1.30 mmol/L Final  .  TCO2 04/02/2016 22  0 - 100 mmol/L Final  . Hemoglobin 04/02/2016 10.9* 12.0 - 15.0 g/dL Final  . HCT 04/02/2016 32.0* 36.0 - 46.0 % Final  . Sodium 04/02/2016 139  135 - 145 mmol/L Final  . Potassium 04/02/2016 4.0  3.5 - 5.1 mmol/L Final  . Chloride 04/02/2016 106  101 - 111 mmol/L Final  . CO2 04/02/2016 21* 22 - 32 mmol/L Final  . Glucose, Bld 04/02/2016 213* 65 - 99 mg/dL Final  . BUN 04/02/2016 14  6 - 20 mg/dL Final  . Creatinine, Ser 04/02/2016 0.98  0.44 - 1.00 mg/dL Final  . Calcium 04/02/2016 8.5* 8.9 - 10.3 mg/dL Final  . Total Protein 04/02/2016 5.7* 6.5 - 8.1 g/dL Final  . Albumin 04/02/2016 3.3* 3.5 - 5.0 g/dL Final  . AST 04/02/2016 15  15 - 41 U/L Final  . ALT 04/02/2016 14  14 - 54 U/L Final  . Alkaline Phosphatase 04/02/2016 59  38 - 126 U/L Final  . Total Bilirubin 04/02/2016 0.6  0.3 - 1.2 mg/dL Final  . GFR calc non Af Amer 04/02/2016 53* >60 mL/min Final  . GFR  calc Af Amer 04/02/2016 >60  >60 mL/min Final   Comment: (NOTE) The eGFR has been calculated using the CKD EPI equation. This calculation has not been validated in all clinical situations. eGFR's persistently <60 mL/min signify possible Chronic Kidney Disease.   . Anion gap 04/02/2016 12  5 - 15 Final  . Prothrombin Time 04/02/2016 16.5* 11.6 - 15.2 seconds Final  . INR 04/02/2016 1.32  0.00 - 1.49 Final  . Troponin i, poc 04/02/2016 0.00  0.00 - 0.08 ng/mL Final  . Comment 3 04/02/2016          Final   Comment: Due to the release kinetics of cTnI, a negative result within the first hours of the onset of symptoms does not rule out myocardial infarction with certainty. If myocardial infarction is still suspected, repeat the test at appropriate intervals.   . ABO/RH(D) 04/02/2016 O POS   Final  . Antibody Screen 04/02/2016 NEG   Final  . Sample Expiration 04/02/2016 04/05/2016   Final  . Color, Urine 04/03/2016 YELLOW  YELLOW Final  . APPearance 04/03/2016 CLEAR  CLEAR Final  . Specific Gravity, Urine 04/03/2016 1.015  1.005 - 1.030 Final  . pH 04/03/2016 5.5  5.0 - 8.0 Final  . Glucose, UA 04/03/2016 NEGATIVE  NEGATIVE mg/dL Final  . Hgb urine dipstick 04/03/2016 NEGATIVE  NEGATIVE Final  . Bilirubin Urine 04/03/2016 NEGATIVE  NEGATIVE Final  . Ketones, ur 04/03/2016 NEGATIVE  NEGATIVE mg/dL Final  . Protein, ur 04/03/2016 NEGATIVE  NEGATIVE mg/dL Final  . Nitrite 04/03/2016 NEGATIVE  NEGATIVE Final  . Leukocytes, UA 04/03/2016 NEGATIVE  NEGATIVE Final   MICROSCOPIC NOT DONE ON URINES WITH NEGATIVE PROTEIN, BLOOD, LEUKOCYTES, NITRITE, OR GLUCOSE <1000 mg/dL.  Marland Kitchen D-Dimer, Quant 04/02/2016 3.31* 0.00 - 0.50 ug/mL-FEU Final   Comment: (NOTE) At the manufacturer cut-off of 0.50 ug/mL FEU, this assay has been documented to exclude PE with a sensitivity and negative predictive value of 97 to 99%.  At this time, this assay has not been approved by the FDA to exclude DVT/VTE. Results  should be correlated with clinical presentation.   . ABO/RH(D) 04/02/2016 O POS   Final  . Glucose-Capillary 04/02/2016 165* 65 - 99 mg/dL Final  . Weight 04/03/2016 1896   Final  . Height 04/03/2016 63   Final  . BP 04/03/2016  120/59   Final  . Sodium 04/03/2016 139  135 - 145 mmol/L Final  . Potassium 04/03/2016 3.5  3.5 - 5.1 mmol/L Final  . Chloride 04/03/2016 106  101 - 111 mmol/L Final  . CO2 04/03/2016 20* 22 - 32 mmol/L Final  . Glucose, Bld 04/03/2016 173* 65 - 99 mg/dL Final  . BUN 04/03/2016 14  6 - 20 mg/dL Final  . Creatinine, Ser 04/03/2016 1.08* 0.44 - 1.00 mg/dL Final  . Calcium 04/03/2016 8.3* 8.9 - 10.3 mg/dL Final  . Total Protein 04/03/2016 5.2* 6.5 - 8.1 g/dL Final  . Albumin 04/03/2016 2.9* 3.5 - 5.0 g/dL Final  . AST 04/03/2016 14* 15 - 41 U/L Final  . ALT 04/03/2016 13* 14 - 54 U/L Final  . Alkaline Phosphatase 04/03/2016 52  38 - 126 U/L Final  . Total Bilirubin 04/03/2016 0.7  0.3 - 1.2 mg/dL Final  . GFR calc non Af Amer 04/03/2016 48* >60 mL/min Final  . GFR calc Af Amer 04/03/2016 55* >60 mL/min Final   Comment: (NOTE) The eGFR has been calculated using the CKD EPI equation. This calculation has not been validated in all clinical situations. eGFR's persistently <60 mL/min signify possible Chronic Kidney Disease.   . Anion gap 04/03/2016 13  5 - 15 Final  . WBC 04/03/2016 7.0  4.0 - 10.5 K/uL Final  . RBC 04/03/2016 3.44* 3.87 - 5.11 MIL/uL Final  . Hemoglobin 04/03/2016 9.6* 12.0 - 15.0 g/dL Final  . HCT 04/03/2016 29.4* 36.0 - 46.0 % Final  . MCV 04/03/2016 85.5  78.0 - 100.0 fL Final  . MCH 04/03/2016 27.9  26.0 - 34.0 pg Final  . MCHC 04/03/2016 32.7  30.0 - 36.0 g/dL Final  . RDW 04/03/2016 13.3  11.5 - 15.5 % Final  . Platelets 04/03/2016 107* 150 - 400 K/uL Final   CONSISTENT WITH PREVIOUS RESULT  . Prothrombin Time 04/03/2016 30.0* 11.6 - 15.2 seconds Final  . INR 04/03/2016 2.92* 0.00 - 1.49 Final  . B Natriuretic Peptide 04/03/2016  204.8* 0.0 - 100.0 pg/mL Final  . Magnesium 04/03/2016 1.0* 1.7 - 2.4 mg/dL Final  . Glucose-Capillary 04/03/2016 181* 65 - 99 mg/dL Final  . Hgb A1c MFr Bld 04/03/2016 7.9* 4.8 - 5.6 % Final   Comment: (NOTE)         Pre-diabetes: 5.7 - 6.4         Diabetes: >6.4         Glycemic control for adults with diabetes: <7.0   . Mean Plasma Glucose 04/03/2016 180   Final   Comment: (NOTE) Performed At: [facility] [place], [place] [id number] Lindon Romp MD [id number]   . Glucose-Capillary 04/03/2016 211* 65 - 99 mg/dL Final  . Comment 1 04/03/2016 Notify RN   Final  . Comment 2 04/03/2016 Document in Chart   Final  . Glucose-Capillary 04/03/2016 193* 65 - 99 mg/dL Final  . Comment 1 04/03/2016 Notify RN   Final  . Comment 2 04/03/2016 Document in Chart   Final  . Glucose-Capillary 04/03/2016 165* 65 - 99 mg/dL Final  . Glucose-Capillary 04/04/2016 183* 65 - 99 mg/dL Final  . Glucose-Capillary 04/04/2016 189* 65 - 99 mg/dL Final  . Comment 1 04/04/2016 Notify RN   Final  . Comment 2 04/04/2016 Document in Chart   Final    No results found.   Assessment/Plan   This encounter was created in error - please disregard.

## 2016-05-18 DIAGNOSIS — M6281 Muscle weakness (generalized): Secondary | ICD-10-CM | POA: Diagnosis not present

## 2016-05-18 DIAGNOSIS — Z7984 Long term (current) use of oral hypoglycemic drugs: Secondary | ICD-10-CM | POA: Diagnosis not present

## 2016-05-18 DIAGNOSIS — I2510 Atherosclerotic heart disease of native coronary artery without angina pectoris: Secondary | ICD-10-CM | POA: Diagnosis not present

## 2016-05-18 DIAGNOSIS — Z85118 Personal history of other malignant neoplasm of bronchus and lung: Secondary | ICD-10-CM | POA: Diagnosis not present

## 2016-05-18 DIAGNOSIS — E1122 Type 2 diabetes mellitus with diabetic chronic kidney disease: Secondary | ICD-10-CM | POA: Diagnosis not present

## 2016-05-18 DIAGNOSIS — I129 Hypertensive chronic kidney disease with stage 1 through stage 4 chronic kidney disease, or unspecified chronic kidney disease: Secondary | ICD-10-CM | POA: Diagnosis not present

## 2016-05-18 DIAGNOSIS — Z8673 Personal history of transient ischemic attack (TIA), and cerebral infarction without residual deficits: Secondary | ICD-10-CM | POA: Diagnosis not present

## 2016-05-18 DIAGNOSIS — D696 Thrombocytopenia, unspecified: Secondary | ICD-10-CM | POA: Diagnosis not present

## 2016-05-18 DIAGNOSIS — D649 Anemia, unspecified: Secondary | ICD-10-CM | POA: Diagnosis not present

## 2016-05-18 DIAGNOSIS — E039 Hypothyroidism, unspecified: Secondary | ICD-10-CM | POA: Diagnosis not present

## 2016-05-18 DIAGNOSIS — N183 Chronic kidney disease, stage 3 (moderate): Secondary | ICD-10-CM | POA: Diagnosis not present

## 2016-05-18 DIAGNOSIS — I4891 Unspecified atrial fibrillation: Secondary | ICD-10-CM | POA: Diagnosis not present

## 2016-05-20 DIAGNOSIS — Z85118 Personal history of other malignant neoplasm of bronchus and lung: Secondary | ICD-10-CM | POA: Diagnosis not present

## 2016-05-20 DIAGNOSIS — I4891 Unspecified atrial fibrillation: Secondary | ICD-10-CM | POA: Diagnosis not present

## 2016-05-20 DIAGNOSIS — E1122 Type 2 diabetes mellitus with diabetic chronic kidney disease: Secondary | ICD-10-CM | POA: Diagnosis not present

## 2016-05-20 DIAGNOSIS — Z7984 Long term (current) use of oral hypoglycemic drugs: Secondary | ICD-10-CM | POA: Diagnosis not present

## 2016-05-20 DIAGNOSIS — I129 Hypertensive chronic kidney disease with stage 1 through stage 4 chronic kidney disease, or unspecified chronic kidney disease: Secondary | ICD-10-CM | POA: Diagnosis not present

## 2016-05-20 DIAGNOSIS — E039 Hypothyroidism, unspecified: Secondary | ICD-10-CM | POA: Diagnosis not present

## 2016-05-20 DIAGNOSIS — D696 Thrombocytopenia, unspecified: Secondary | ICD-10-CM | POA: Diagnosis not present

## 2016-05-20 DIAGNOSIS — I2510 Atherosclerotic heart disease of native coronary artery without angina pectoris: Secondary | ICD-10-CM | POA: Diagnosis not present

## 2016-05-20 DIAGNOSIS — Z8673 Personal history of transient ischemic attack (TIA), and cerebral infarction without residual deficits: Secondary | ICD-10-CM | POA: Diagnosis not present

## 2016-05-20 DIAGNOSIS — D649 Anemia, unspecified: Secondary | ICD-10-CM | POA: Diagnosis not present

## 2016-05-20 DIAGNOSIS — M6281 Muscle weakness (generalized): Secondary | ICD-10-CM | POA: Diagnosis not present

## 2016-05-20 DIAGNOSIS — N183 Chronic kidney disease, stage 3 (moderate): Secondary | ICD-10-CM | POA: Diagnosis not present

## 2016-05-21 DIAGNOSIS — D649 Anemia, unspecified: Secondary | ICD-10-CM | POA: Diagnosis not present

## 2016-05-21 DIAGNOSIS — R55 Syncope and collapse: Secondary | ICD-10-CM | POA: Diagnosis not present

## 2016-05-23 DIAGNOSIS — I4891 Unspecified atrial fibrillation: Secondary | ICD-10-CM | POA: Diagnosis not present

## 2016-05-23 DIAGNOSIS — M6281 Muscle weakness (generalized): Secondary | ICD-10-CM | POA: Diagnosis not present

## 2016-05-23 DIAGNOSIS — Z85118 Personal history of other malignant neoplasm of bronchus and lung: Secondary | ICD-10-CM | POA: Diagnosis not present

## 2016-05-23 DIAGNOSIS — E039 Hypothyroidism, unspecified: Secondary | ICD-10-CM | POA: Diagnosis not present

## 2016-05-23 DIAGNOSIS — I2510 Atherosclerotic heart disease of native coronary artery without angina pectoris: Secondary | ICD-10-CM | POA: Diagnosis not present

## 2016-05-23 DIAGNOSIS — D696 Thrombocytopenia, unspecified: Secondary | ICD-10-CM | POA: Diagnosis not present

## 2016-05-23 DIAGNOSIS — I129 Hypertensive chronic kidney disease with stage 1 through stage 4 chronic kidney disease, or unspecified chronic kidney disease: Secondary | ICD-10-CM | POA: Diagnosis not present

## 2016-05-23 DIAGNOSIS — D649 Anemia, unspecified: Secondary | ICD-10-CM | POA: Diagnosis not present

## 2016-05-23 DIAGNOSIS — Z8673 Personal history of transient ischemic attack (TIA), and cerebral infarction without residual deficits: Secondary | ICD-10-CM | POA: Diagnosis not present

## 2016-05-23 DIAGNOSIS — Z7984 Long term (current) use of oral hypoglycemic drugs: Secondary | ICD-10-CM | POA: Diagnosis not present

## 2016-05-23 DIAGNOSIS — N183 Chronic kidney disease, stage 3 (moderate): Secondary | ICD-10-CM | POA: Diagnosis not present

## 2016-05-23 DIAGNOSIS — E1122 Type 2 diabetes mellitus with diabetic chronic kidney disease: Secondary | ICD-10-CM | POA: Diagnosis not present

## 2016-05-27 DIAGNOSIS — R0602 Shortness of breath: Secondary | ICD-10-CM | POA: Diagnosis not present

## 2016-05-27 DIAGNOSIS — E118 Type 2 diabetes mellitus with unspecified complications: Secondary | ICD-10-CM | POA: Diagnosis not present

## 2016-05-27 DIAGNOSIS — R042 Hemoptysis: Secondary | ICD-10-CM | POA: Diagnosis not present

## 2016-05-27 DIAGNOSIS — R918 Other nonspecific abnormal finding of lung field: Secondary | ICD-10-CM | POA: Diagnosis not present

## 2016-05-27 DIAGNOSIS — E789 Disorder of lipoprotein metabolism, unspecified: Secondary | ICD-10-CM | POA: Diagnosis not present

## 2016-06-02 DIAGNOSIS — I482 Chronic atrial fibrillation: Secondary | ICD-10-CM | POA: Diagnosis not present

## 2016-06-02 DIAGNOSIS — R532 Functional quadriplegia: Secondary | ICD-10-CM | POA: Diagnosis not present

## 2016-06-04 DIAGNOSIS — I482 Chronic atrial fibrillation: Secondary | ICD-10-CM | POA: Diagnosis not present

## 2016-06-04 DIAGNOSIS — I350 Nonrheumatic aortic (valve) stenosis: Secondary | ICD-10-CM | POA: Diagnosis not present

## 2016-06-04 DIAGNOSIS — I2510 Atherosclerotic heart disease of native coronary artery without angina pectoris: Secondary | ICD-10-CM | POA: Diagnosis not present

## 2016-06-04 DIAGNOSIS — R0602 Shortness of breath: Secondary | ICD-10-CM | POA: Diagnosis not present

## 2016-06-12 DIAGNOSIS — I10 Essential (primary) hypertension: Secondary | ICD-10-CM | POA: Diagnosis not present

## 2016-06-12 DIAGNOSIS — E118 Type 2 diabetes mellitus with unspecified complications: Secondary | ICD-10-CM | POA: Diagnosis not present

## 2016-06-13 ENCOUNTER — Encounter (HOSPITAL_COMMUNITY): Payer: Self-pay | Admitting: Nurse Practitioner

## 2016-06-13 ENCOUNTER — Ambulatory Visit (HOSPITAL_COMMUNITY)
Admission: EM | Admit: 2016-06-13 | Discharge: 2016-06-13 | Disposition: A | Discharge status: Home / Self Care | Payer: Medicare Other | Attending: Family Medicine | Admitting: Family Medicine

## 2016-06-13 DIAGNOSIS — S00462A Insect bite (nonvenomous) of left ear, initial encounter: Secondary | ICD-10-CM | POA: Diagnosis not present

## 2016-06-13 DIAGNOSIS — W57XXXA Bitten or stung by nonvenomous insect and other nonvenomous arthropods, initial encounter: Secondary | ICD-10-CM

## 2016-06-13 MED ORDER — FLUTICASONE PROPIONATE 0.05 % EX CREA: TOPICAL_CREAM | Freq: Two times a day (BID) | CUTANEOUS | Status: DC

## 2016-06-13 MED ORDER — MUPIROCIN 2 % EX OINT: TOPICAL_OINTMENT | CUTANEOUS | Status: DC

## 2016-06-13 NOTE — ED Provider Notes (Signed)
CSN: [id number]     Arrival date & time 06/13/16  1338 History   First MD Initiated Contact with Patient 06/13/16 1400     Chief Complaint  Patient presents with  . Otalgia   (Consider location/radiation/quality/duration/timing/severity/associated sxs/prior Treatment) Patient is a 82 y.o. female presenting with ear pain. The history is provided by the patient.  Otalgia Location:  Left Quality:  Sore Severity:  Mild Duration:  12 months Progression:  Waxing and waning Chronicity:  Chronic Context comment:  Feels problem is a reaction to a tick bite found 1 yr ago. Associated symptoms: rash   Associated symptoms: no ear discharge and no fever     Past Medical History  Diagnosis Date  . Lung cancer ([place])   . Stroke ([place])   . Diabetes mellitus   . CAD (coronary artery disease) 03/13/2012  . Type II or unspecified type diabetes mellitus without mention of complication, not stated as uncontrolled 03/13/2012  . Hypothyroid 03/13/2012  . HTN (hypertension) 03/13/2012  . Dysrhythmia     ATRIAL FIB  . Shortness of breath   . Syncope and collapse 04/02/2016   Past Surgical History  Procedure Laterality Date  . Cardioversion  04/20/2012    Procedure: CARDIOVERSION;  Surgeon: Laverda Page, MD;  Location: [place];  Service: Cardiovascular;  Laterality: N/A;  . Abdominal hysterectomy    . Tumors      back of head  . Hemorrhoid surgery    . Jaw replacement    . Cholecystectomy    . Breast surgery      benign tumors removed in 1980  . Cardiac catheterization    . Cardioversion N/A 05/10/2013    Procedure: CARDIOVERSION;  Surgeon: Laverda Page, MD;  Location: [place];  Service: Cardiovascular;  Laterality: N/A;  . Left heart catheterization with coronary angiogram N/A 04/20/2015    Procedure: LEFT HEART CATHETERIZATION WITH CORONARY ANGIOGRAM;  Surgeon: Adrian Prows, MD;  Location: [facility] CATH LAB;  Service: Cardiovascular;  Laterality: N/A;   Family History  Problem Relation Age of  Onset  . Breast cancer Mother   . Aneurysm Father     Likely thoracic aneurysm per patient  . Melanoma Brother   . Heart disease Sister   . Diabetes type II Sister    Social History  Substance Use Topics  . Smoking status: Never Smoker   . Smokeless tobacco: Never Used  . Alcohol Use: No   OB History    No data available     Review of Systems  Constitutional: Negative.  Negative for fever.  HENT: Positive for ear pain. Negative for ear discharge and facial swelling.   Skin: Positive for rash.  All other systems reviewed and are negative.   Allergies  Codeine and Hydrocodone-acetaminophen  Home Medications   Prior to Admission medications   Medication Sig Start Date End Date Taking? Authorizing Provider  acetaminophen (TYLENOL) 500 MG tablet Take 500 mg by mouth every 6 (six) hours as needed for moderate pain or headache.     Historical Provider, MD  atorvastatin (LIPITOR) 40 MG tablet Take 1 tablet (40 mg total) by mouth daily. 04/20/15   Janece Canterbury, MD  Calcium Carb-Cholecalciferol (CALCIUM-VITAMIN D3) 600-500 MG-UNIT CAPS Take 1 capsule by mouth daily.    Historical Provider, MD  colesevelam (WELCHOL) 625 MG tablet Take 625 mg by mouth daily.     Historical Provider, MD  diltiazem (CARDIZEM CD) 180 MG 24 hr capsule Take 1 capsule (180 mg total)  by mouth daily. 04/20/15   Janece Canterbury, MD  fluticasone (CUTIVATE) 0.05 % cream Apply topically 2 (two) times daily. 06/13/16   Billy Fischer, MD  hydrOXYzine (VISTARIL) 25 MG capsule Take 25 mg by mouth at bedtime. 03/31/16   Historical Provider, MD  insulin aspart (NOVOLOG) 100 UNIT/ML injection Check FSBS before each meal for DM. Inject SQ < 150 =0 u; 151-200 = 2 U; 201-250 = 4 U; 251-300 = 6 U; 301-350 = 8 U; 351-400 = 10 U; >/= 401 =12 U.    Historical Provider, MD  Insulin Glargine (TOUJEO SOLOSTAR) 300 UNIT/ML SOPN Inject 10 Units into the skin at bedtime.    Historical Provider, MD  isosorbide mononitrate (IMDUR) 60 MG 24  hr tablet Take 1 tablet (60 mg total) by mouth daily. 04/20/15   Janece Canterbury, MD  levothyroxine (SYNTHROID, LEVOTHROID) 100 MCG tablet Take 1 tablet every morning (6:30 am) on an empty stomach.    Historical Provider, MD  Mag Oxide-Vit D3-Turmeric (MAGNESIUM-VITAMIN D3-TURMERIC PO) Take 1 tablet by mouth daily.    Historical Provider, MD  magnesium oxide (MAG-OX) 400 (241.3 Mg) MG tablet Take 1 tablet (400 mg total) by mouth 2 (two) times daily. 04/09/16   Nita Sells, MD  meclizine (ANTIVERT) 25 MG tablet Take 25 mg by mouth daily as needed for dizziness.     Historical Provider, MD  Multiple Vitamins-Minerals (WOMENS BONE HEALTH PO) Take 1 tablet by mouth daily.    Historical Provider, MD  mupirocin ointment (BACTROBAN) 2 % Apply to skin bid 06/13/16   Billy Fischer, MD  Polyethyl Glycol-Propyl Glycol (SYSTANE) 0.4-0.3 % SOLN Apply 2 drops to eye 2 (two) times daily as needed (dry eyes).    Historical Provider, MD  polyvinyl alcohol (LIQUIFILM TEARS) 1.4 % ophthalmic solution Place 2 drops into both eyes as needed for dry eyes.     Historical Provider, MD  promethazine (PHENERGAN) 25 MG/ML injection Inject 31m = 25 mg IM every 6 hours as needed if unable to give suppository for 3 doses.    Historical Provider, MD  Rivaroxaban (XARELTO) 15 MG TABS tablet Take 15 mg by mouth daily with supper. 03/16/12   JAdrian Prows MD  sotalol (BETAPACE) 120 MG tablet Take 1 tablet (120 mg total) by mouth 2 (two) times daily. 04/09/16   JNita Sells MD  traMADol (ULTRAM) 50 MG tablet Take 1 tablet (50 mg total) by mouth every 6 (six) hours as needed. 03/15/16   SOlivia CanterSam, PA-C  triamcinolone (KENALOG) 0.025 % ointment Apply 1 application topically 2 (two) times daily. Do not apply to face 03/28/16   RMontine Circle PA-C   Meds Ordered and Administered this Visit  Medications - No data to display  BP 137/71 mmHg  Pulse 86  Temp(Src) 97.5 F (36.4 C) (Oral)  Resp 18  SpO2 94% No data  found.   Physical Exam  Constitutional: She is oriented to person, place, and time. She appears well-developed and well-nourished. No distress.  HENT:  Right Ear: External ear normal.  Mouth/Throat: Oropharynx is clear and moist.  Sl crusting lesion at inner tragus fold, no erythema or fb seen.  Neck: Normal range of motion. Neck supple.  Lymphadenopathy:    She has no cervical adenopathy.  Neurological: She is alert and oriented to person, place, and time.  Skin: Skin is warm and dry.  Nursing note and vitals reviewed.   ED Course  Procedures (including critical care time)  Labs Review Labs  Reviewed - No data to display  Imaging Review No results found.   Visual Acuity Review  Right Eye Distance:   Left Eye Distance:   Bilateral Distance:    Right Eye Near:   Left Eye Near:    Bilateral Near:         MDM   1. Nonvenomous insect bite of left ear, initial encounter        Billy Fischer, MD 06/13/16 1435

## 2016-06-13 NOTE — ED Notes (Signed)
Pt c/o 1 year history of L ear pain to auricle of ear. Onset after she pulled a tick from the ear. She reports she has been to several doctors for this and tried multiple remedies with no relief. She reports she could not sleep last night due to pain and pus was draining from the site today.

## 2016-06-17 DIAGNOSIS — H9209 Otalgia, unspecified ear: Secondary | ICD-10-CM | POA: Diagnosis not present

## 2016-06-17 DIAGNOSIS — R0602 Shortness of breath: Secondary | ICD-10-CM | POA: Diagnosis not present

## 2016-06-17 DIAGNOSIS — E118 Type 2 diabetes mellitus with unspecified complications: Secondary | ICD-10-CM | POA: Diagnosis not present

## 2016-06-17 DIAGNOSIS — E789 Disorder of lipoprotein metabolism, unspecified: Secondary | ICD-10-CM | POA: Diagnosis not present

## 2016-06-18 DIAGNOSIS — I482 Chronic atrial fibrillation: Secondary | ICD-10-CM | POA: Diagnosis not present

## 2016-06-23 NOTE — H&P (Signed)
OFFICE VISIT NOTES COPIED TO EPIC FOR DOCUMENTATION  . History of Present Illness Laverda Page MD; 06/05/2016 1:45 PM) The patient is a 82 year old female who presents for a follow-up for Atrial fibrillation. 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  Last O/V 02/15/16; F/U 3 mnth for afib; pt states that she is having SOB when she is walking and mild chest pain. When she has her chest pain she sits down and holds her chest until it goes away. She was in the hospital from a fall that she had in April 2017 from syncope and was later taken to Rehab for 17 days.  Tolerating anticoagulation with Xarelto, although has occasionlly spit up blood. No reccurent fall or syncope.  She also has history of coronary artery disease with high-grade stenosis and small diagonal vessels and the mid LAD 50% stenosis by angiography in 2016. She has mild aortic stenosis. Other history includes FMD involving bilateral renal arteries but blood pressure has been well-controlled. Has right subclavian artery stenosis. No dizziness or vertebrobasilar insufficiency.  She denies symptoms suggestive of PND, no symptoms of TIA. She has had back pain and was recommended surgery and she refused and wants to be treated medically. No leg edema, no recent weight change.  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 Previously identified pulmonary nodules are not identified on today's exam. BIlateral lungs left worse than the right and stable and follows [facility]. Chronic atrial fibrillation (I48.2)  CHA2DS2-VASc Score is 6  with yearly risk of stroke of 9.8%. HAS-Bled score is 2 and estimated major bleeding in one year is 1.88-3.2% Atherosclerosis of native coronary artery of native heart without angina pectoris (I25.10)  Coronary angiogram for 04/20/2015: Normal LVEF. Mild on critical CAD. D1 and D2 have high-grade stenosis, very small and unchanged from 2007. Mid LAD 50% stenosis unchanged from 2007. Mild aortic stenosis with a 12 mm pressure gradient across the aortic valve. Shortness of breath on exertion (R06.02)  Echocardiogram 04/19/2015: Normal LV systolic function, moderately calcified aortic valve. Mild aortic stenosis. Aortic valve area 1.0 cm, peak gradient of 26 and mean gradient of 12 mmHg. Moderate pulmonary hypertension, PA pressure estimated at 41 mmHg. Essential hypertension, benign (I10)  Diabetes mellitus type 2, uncontrolled, without complications (A41.66)  Fibromuscular dysplasia of renal artery (I77.89)  Bilateral Fibromuscular dysplasia of renal arteries incidental finding during cardiac cath Sept 2007. Pure hypercholesterolemia (E78.00)  Long-term (current) use of anticoagulants (Z79.01)  Chronic fatigue and malaise (R53.82, R53.81)  Improvingon O2 supplimentation since Nov 2015 Pain in both lower extremities (M79.604, M79.605)  ABI 04/19/2015: Normal at hospital Itching (L29.9)  Labwork  Labs 03/22/2015: serum glucose 233, creatinine 1.19, CMP otherwise normal labs 02/16/2015: HbA1c 7.7%, total cholesterol 131, triglycerides 164, HDL 38, LDL 60 Labs to 02/24/2013: Blood sugar elevated at 205. BUN 9, serum creatinine 0.8. Electrolytes are within normal limits. History of cardioversion (A63.016) 05/20/2013 Unsuccessful cardioversion on 05/10/2013. Cardioversion 04/20/12 successful. Nocturnal hypoxemia (G47.34) 11/02/2014 On O2 therapy. Nocturnal oximetry 10/30/2014: Time less than 88%: 175.3 minutes, time less than 89%: 237.7 minutes, high SpO2 99%, low SpO2 82%, total desaturation events: 12,  average events per hour: 2.  It appears this patient qualifies for nocturnal oxygen per Medicare guidelines group 1  Allergies Theora Gianotti; 06/15/16 2:55 PM) Codeine Phosphate *ANALGESICS - OPIOID*  Anaphylaxis, Dizziness. Lightheadedness Flecainide Acetate *ANTIARRHYTHMICS*  Tremors  Family History Theora Gianotti; June 15, 2016 2:55 PM) Mother  Deceased. at age 39 from breast cancer Father  Deceased. at age 28 from a cerebral hemorrhage, h/o several MI's. Sister 1  Deceased. at age 13 from complications of diabetes Sister 2  1- has a pacemaker Siblings  6 siblings total.  Social History Theora Gianotti; 2016/06/15 2:55 PM) Non Drinker/No Alcohol Use  Non Smoker/No Tobacco Use  Marital status  Married. Number of Children  3. Living Situation  Lives with spouse.  Past Surgical History Theora Gianotti; June 15, 2016 2:55 PM) Right subclavian arteriogram 04/21/11: Complex high grade proximal subclavian artery stenosis proximal to large dominant right vertebral artery. left subclavian artery normal. Left vertebral small and occluded.  CAD: Mid LAD 50-60%, Diffuse distal LAD disease, D1 ostial 80%, mid 60% stenosis. Normal Circumflex and RCA by Cath 10/07.  Hysterectomy in the 1970's.  Benign left breast tumors removed in the 1980's  Benign tumors removed from back of head in the 1970's.  Bilateral Fibromuscular dysplasia of renal arteries incidental finding during cath.  Surgery on tear ducts (both eyes) 10/10/2013 Cataract Surgery 02/2014 Oct (2014) and Feb 2015.  Medication History Theora Gianotti; 15-Jun-2016 2:55 PM) Digoxin (125MCG Tablet, 1 (one) Tablet Tablet Oral daily, Taken starting 01/17/2016) Active. (d/c amiodarone) Metoprolol Tartrate ('50MG'$  Tablet, 1 (one) Tablet Tablet Tablet T Oral two times daily, Taken starting 11/29/2015) Active. (Discontinue Sotalol) Diltiazem HCl ER ('180MG'$  Capsule ER 24HR, 1 Capsule ER 24HR Capsule ER Oral daily, Taken starting 03/27/2015)  Active. Xarelto ('15MG'$  Tablet, 1 Tablet Tablet Tablet Tablet Oral daily, with supper, Taken starting 03/26/2015) Active. Atorvastatin Calcium ('40MG'$  Tablet, 1 (one) Tablet Tablet Tablet T Oral daily, Taken starting 09/11/2014) Active. (increased from '10mg'$  to '40mg'$  daily by Dr Wilson Singer) Nitrostat (0.'4MG'$  Tab Sublingual, 1 (one) Tab Sublingual Tab Sub Sublingual every 5 minutes as needed for chest pain., Taken starting 05/18/2013) Active. Sulfamethoxazole-Trimethoprim (800-'160MG'$  Tablet, 1 Oral two times daily, Taken starting 12/25/2015) Active. (for 10 days) Furosemide ('20MG'$  Tablet, 1 Oral daily as needed, Taken starting 07/19/2014) Active. Omeprazole ('20MG'$  Capsule DR, 1 Oral daily, Taken starting 07/19/2014) Active. Synthroid (150MCG Tablet, 1 Oral daily) Active. Meclizine HCl ('25MG'$  Tablet, 1 Oral four times daily, as needed for dizziness) Active. Acetaminophen ('500MG'$  Capsule, Oral as needed) Active. Welchol ('625MG'$  Tablet, Oral as needed) Active. Isosorbide Mononitrate ER ('60MG'$  Tablet ER 24HR, 1 Oral daily) Active. [facility] Packet, 1 Oral daily) Active. MetFORMIN HCl ER ('500MG'$  Tablet ER 24HR, 2 Oral daily) Active. Valsartan ('40MG'$  Tablet, 1 Oral daily) Active. Calcium + D (1 Oral daily) Specific dose unknown - Active.  Diagnostic Studies History Neldon Labella, [place]; Jun 15, 2016 3:57 PM) Synchronized direct current cardioversion on April 23rd 2013 A. fib to NSR. Started Flecainide.  Colonoscopy 2008 (benign polyp removed)  ECG 08/05/12: S. Bradycardia @ 58/min. Low voltage complexes. Normal intervals. QT 406 mSec. No ischemia. Comared to ECG 06/10/12: Sinus with V rate of 69/min. Low voltage. Ant infarct, old. No ischemia. Normal QT.  Lexiscan sestamibi 04/16/12: a. Fib with RVR. Normal perfusion. normal LVEF.  Heart cath 10/27/06 (diagram): Mid LAD 50%, D1 ostial 80% mid 60%. FMD bilat renal arteries (inciden  Cardioversion 05/10/2013 Unsuccessful cardioversion on 05/10/2013.  Cardioversion 04/20/12 successful. Echocardiogram 96/03/5408 Normal LV systolic function, moderate LVH. Cannot evaluate diastolic function due to atrial fibrillation.  Mildly calcified mildly restricted aortic valve with mild aortic stenosis. Aortic valve area calculated 1.04 cm, peak gradient 26 mmHg, mean gradient 10 mmHg. Moderate pulmonary hypertension. Mildly calcified mitral valve. Hand X-ray 04/2016 Left Echocardiogram   Other Problems Loletha Grayer [place]; 06/04/2016 2:55 PM) Pulmonary carcinoma rare form diagnosed 2008. Slowly growing and stable follows [facility].  Cardioversion 04/20/12-Indication symptomatic A. Fibrillation. Patient was delivered with 100J, 120 J x 3 then 150J Joules of electricity with success to NSR.  Hospital[facility] at [facility] from 3/16-3/19/13 for A. Fib. Echo 03/14/12.  Synchronized direct current cardioversion on April 23rd 2013 A. fib to NSR. Started Flecainide.  Was [facility] to [facility] hospital due to a [facility], had a concusion and two broken ribs and a fractured shoulder 09/2012    Review of Systems Laverda Page, MD; 06/05/2016 1:47 PM) General Present- Fatigue (chronic, improved on O2 supplimentation). Not Present- Unable to Sleep Lying Flat. Skin Present- Itching. HEENT Not Present- Blurred Vision. Respiratory Present- Difficulty Breathing on Exertion (worsening gradually). Not Present- Bloody sputum and Wakes up from Sleep Wheezing or Short of Breath. Cardiovascular Present- Edema (intermittent). Not Present- Chest Pain, Leg Cramps, Palpitations and Paroxysmal Nocturnal Dyspnea. Gastrointestinal Not Present- Black, Tarry Stool, Bloody Stool and Heartburn. Female Genitourinary Not Present- Difficulty Emptying Bladder and Discharge. Musculoskeletal Present- Backache (uses a brace all the times). Not Present- Claudication and Joint Pain. Neurological Not Present- Focal Neurological Symptoms. Psychiatric Not Present- Personality Changes and Suicidal  Ideation. Hematology Not Present- Blood Clots, Easy Bruising and Nose Bleed. All other systems negative  Vitals Loletha Grayer [place]; 06/04/2016 3:11 PM) 06/04/2016 2:59 PM Weight: 122.56 lb Height: 63in Body Surface Area: 1.57 m Body Mass Index: 21.71 kg/m  Pulse: 105 (Regular)  P.OX: 98% (Room air) BP: 142/68 (Sitting, Left Arm, Standard)       Physical Exam Laverda Page, MD; 06/05/2016 1:47 PM) General Mental Status-Alert. General Appearance-Cooperative, Appears stated age, Not in acute distress. Orientation-Oriented X3. Build & Nutrition-Well nourished and Moderately built.  Head and Neck Thyroid Gland Characteristics - no palpable nodules, no palpable enlargement.  Chest and Lung Exam Palpation Tender - No chest wall tenderness. Auscultation Breath sounds - Clear.  Cardiovascular Inspection Jugular vein - Right - No Distention. Auscultation Rhythm - Irregular and Tachycardic. Heart Sounds - S1 is variable, S2 WNL and No gallop present. Murmurs & Other Heart Sounds - Murmur - No murmur.  Abdomen Palpation/Percussion Normal exam - Non Tender and No hepatosplenomegaly. Auscultation Normal exam - Bowel sounds normal.  Peripheral Vascular Lower Extremity Inspection - Left - No Pigmentation, No Varicose veins. Right - No Pigmentation, No Varicose veins. Palpation - Edema - Left - No edema. Right - No edema. Femoral pulse - Left - Normal. Right - Normal. Popliteal pulse - Left - Normal. Right - Normal. Dorsalis pedis pulse - Bilateral - 1+. Posterior tibial pulse - Bilateral - 1+. Carotid arteries - Left-No Carotid bruit. Carotid arteries - Right-No Carotid bruit. Abdomen-No prominent abdominal aortic pulsation, No epigastric bruit.  Neurologic Motor-Grossly intact without any focal deficits.  Musculoskeletal Global Assessment Left Lower Extremity - normal range of motion without pain. Right Lower Extremity - normal range of motion without  pain.    Assessment & Plan Laverda Page MD; 06/05/2016 1:47 PM) Atherosclerosis of native coronary artery of native heart with angina pectoris Story: Coronary angiogram for 04/20/2015: Normal LVEF. Mild on critical CAD. D1 and D2 have high-grade stenosis, very small and unchanged from 2007. Mid LAD 50% stenosis unchanged  from 2007. Mild aortic stenosis with a 12 mm pressure gradient across the aortic valve. Impression: EKG 04/21/2014: Atrial fibrillation with controlled ventricular response at the rate of 88 bpm, poor with progression, cannot exclude anterior infarct, old. Diffuse nonspecific ST-T wave changes. Borderline low voltage complexes. No significant change from prior EKG Chronic atrial fibrillation (I48.2) Story: CHA2DS2-VASc Score is 6 with yearly risk of stroke of 9.8%. HAS-Bled score is 2 and estimated major bleeding in one year is 1.88-3.2% Impression: EKG 06/04/2016: Atrial fibrillation with controlled ventricular response at the rate of 81 bpm, normal axis, inferior and lateral ST segment depression with T-wave flattening, cannot exclude ischemia versus digitalis effect. Low-voltage complexes. No significant change from EKG 02/15/2016 Current Plans Complete electrocardiogram (93000) Future Plans 8/58/8502: METABOLIC PANEL, BASIC (77412) - one time 06/16/2016: CBC & PLATELETS (AUTO) (87867) - one time 06/16/2016: PT (PROTHROMBIN TIME) (67209) - one time Shortness of breath on exertion (R06.02) Story: CXR 04/07/16: No acute abnormality. Borderline cardiomegaly. Emphysema. Multiple old left rib fractures. Osteopenia.  CT Chest 04/03/16: No pulmonary embolus. No acute process. Multiple bilateral pulmonary nodules, unchanged dating back to 2013 and considered benign. Atherosclerosis and coronary artery calcifications. Aortic stenosis, mild (I35.0) Story: Echocardiogram 04/19/2015: Normal LV systolic function, moderately calcified aortic valve. Mild aortic stenosis. Aortic valve area  1.0 cm, peak gradient of 26 and mean gradient of 12 mmHg. Moderate pulmonary hypertension, PA pressure estimated at 41 mmHg. Labwork Story: CMP 04/09/2016: Serum potassium 5.0, serum glucose 219, BUN 15, serum creatinine 0.98. Albumin 3.0. CMP otherwise normal. Hemoglobin 9.2/hematocrit 27.9, platelets 121.  Labs 03/22/2015: serum glucose 233, creatinine 1.19, CMP otherwise normal labs  02/16/2015: HbA1c 7.7%, total cholesterol 131, triglycerides 164, HDL 38, LDL 60  Labs to 02/24/2013: Blood sugar elevated at 205. BUN 9, serum creatinine 0.8. Electrolytes are within normal limits.  Current Plans Mechanism of underlying disease process and action of medications discussed with the patient. I discussed primary/secondary prevention and also dietary counceling was done. Patient has had 2 recent hospitalizations, and with A. fib with RVR she has had elevation on serum troponin associated with chest pain suggestive of non-ST elevation MI. Due to marked worsening dyspnea disproportionate to her CT findings and progressive dyspnea, also associated chest pain, I recommended left and right heart catheterization.  Schedule for cardiac catheterization, and possible angioplasty. We discussed regarding risks, benefits, alternatives to this including stress testing, CTA and continued medical therapy. Patient wants to proceed. Understands <1-2% risk of death, stroke, MI, urgent CABG, bleeding, infection, renal failure but not limited to these. Office visit after the tests.  CC Dr. Gareth Eagle.  Addendum Note(Jagadeesh Carlynn Herald MD; 06/19/2016 9:49 PM) Labs 06/18/2016: Serum glucose 236, BUN 11, serum creatinine 0.90. Potassium 3.7. Hemoglobin 11.3/hematocrit 32.9. RDW 16.5. Platelets 95. Indicis normal. Pro time 16.3, mildly elevated, INR 1.5 (on Xarelto)  Signed by Laverda Page, MD (06/05/2016 1:48 PM)

## 2016-06-24 ENCOUNTER — Encounter (HOSPITAL_COMMUNITY)
Admission: RE | Disposition: A | Discharge status: Home / Self Care | Payer: Self-pay | Source: Ambulatory Visit | Attending: Cardiology

## 2016-06-24 ENCOUNTER — Ambulatory Visit (HOSPITAL_COMMUNITY)
Admission: RE | Admit: 2016-06-24 | Discharge: 2016-06-24 | Disposition: A | Discharge status: Home / Self Care | Payer: Medicare Other | Source: Ambulatory Visit | Attending: Cardiology | Admitting: Cardiology

## 2016-06-24 DIAGNOSIS — I2584 Coronary atherosclerosis due to calcified coronary lesion: Secondary | ICD-10-CM | POA: Diagnosis not present

## 2016-06-24 DIAGNOSIS — I10 Essential (primary) hypertension: Secondary | ICD-10-CM | POA: Insufficient documentation

## 2016-06-24 DIAGNOSIS — I350 Nonrheumatic aortic (valve) stenosis: Secondary | ICD-10-CM | POA: Diagnosis not present

## 2016-06-24 DIAGNOSIS — R0602 Shortness of breath: Secondary | ICD-10-CM | POA: Diagnosis not present

## 2016-06-24 DIAGNOSIS — R5382 Chronic fatigue, unspecified: Secondary | ICD-10-CM | POA: Insufficient documentation

## 2016-06-24 DIAGNOSIS — R0609 Other forms of dyspnea: Secondary | ICD-10-CM | POA: Diagnosis not present

## 2016-06-24 DIAGNOSIS — I272 Other secondary pulmonary hypertension: Secondary | ICD-10-CM | POA: Diagnosis not present

## 2016-06-24 DIAGNOSIS — M79604 Pain in right leg: Secondary | ICD-10-CM | POA: Insufficient documentation

## 2016-06-24 DIAGNOSIS — M79605 Pain in left leg: Secondary | ICD-10-CM | POA: Diagnosis not present

## 2016-06-24 DIAGNOSIS — E7800 Pure hypercholesterolemia, unspecified: Secondary | ICD-10-CM | POA: Insufficient documentation

## 2016-06-24 DIAGNOSIS — Z7901 Long term (current) use of anticoagulants: Secondary | ICD-10-CM | POA: Insufficient documentation

## 2016-06-24 DIAGNOSIS — I2510 Atherosclerotic heart disease of native coronary artery without angina pectoris: Secondary | ICD-10-CM | POA: Diagnosis not present

## 2016-06-24 DIAGNOSIS — C3490 Malignant neoplasm of unspecified part of unspecified bronchus or lung: Secondary | ICD-10-CM | POA: Diagnosis not present

## 2016-06-24 DIAGNOSIS — I482 Chronic atrial fibrillation: Secondary | ICD-10-CM | POA: Diagnosis not present

## 2016-06-24 DIAGNOSIS — E1165 Type 2 diabetes mellitus with hyperglycemia: Secondary | ICD-10-CM | POA: Insufficient documentation

## 2016-06-24 DIAGNOSIS — R0600 Dyspnea, unspecified: Secondary | ICD-10-CM | POA: Diagnosis present

## 2016-06-24 DIAGNOSIS — Z7984 Long term (current) use of oral hypoglycemic drugs: Secondary | ICD-10-CM | POA: Insufficient documentation

## 2016-06-24 LAB — POCT I-STAT 3, VENOUS BLOOD GAS (G3P V)
ACID-BASE EXCESS: 1 mmol/L (ref 0.0–2.0)
ACID-BASE EXCESS: 3 mmol/L — AB (ref 0.0–2.0)
BICARBONATE: 25.7 meq/L — AB (ref 20.0–24.0)
BICARBONATE: 30.9 meq/L — AB (ref 20.0–24.0)
O2 SAT: 49 %
O2 Saturation: 59 %
PCO2 VEN: 39.9 mmHg — AB (ref 45.0–50.0)
PO2 VEN: 30 mmHg — AB (ref 31.0–45.0)
PO2 VEN: 30 mmHg — AB (ref 31.0–45.0)
TCO2: 33 mmol/L (ref 0–100)
TCO2: 27 mmol/L (ref 0–100)
pCO2, Ven: 62.2 mmHg — ABNORMAL HIGH (ref 45.0–50.0)
pH, Ven: 7.304 — ABNORMAL HIGH (ref 7.250–7.300)
pH, Ven: 7.417 — ABNORMAL HIGH (ref 7.250–7.300)

## 2016-06-24 LAB — POCT I-STAT 3, ART BLOOD GAS (G3+)
ACID-BASE EXCESS: 1 mmol/L (ref 0.0–2.0)
Bicarbonate: 25 mEq/L — ABNORMAL HIGH (ref 20.0–24.0)
O2 SAT: 96 %
PCO2 ART: 34.8 mmHg — AB (ref 35.0–45.0)
TCO2: 26 mmol/L (ref 0–100)
pH, Arterial: 7.465 — ABNORMAL HIGH (ref 7.350–7.450)
pO2, Arterial: 79 mmHg — ABNORMAL LOW (ref 80.0–100.0)

## 2016-06-24 LAB — GLUCOSE, CAPILLARY
GLUCOSE-CAPILLARY: 308 mg/dL — AB (ref 65–99)
Glucose-Capillary: 225 mg/dL — ABNORMAL HIGH (ref 65–99)
Glucose-Capillary: 265 mg/dL — ABNORMAL HIGH (ref 65–99)

## 2016-06-24 SURGERY — RIGHT HEART CATH AND CORONARY ANGIOGRAPHY

## 2016-06-24 MED ORDER — HEPARIN (PORCINE) IN NACL 2-0.9 UNIT/ML-% IJ SOLN
INTRAMUSCULAR | Status: AC
  Filled 2016-06-24: qty 1500

## 2016-06-24 MED ORDER — METOPROLOL TARTRATE 5 MG/5ML IV SOLN
INTRAVENOUS | Status: AC
  Administered 2016-06-24: 5 mg
  Filled 2016-06-24: qty 5

## 2016-06-24 MED ORDER — SODIUM CHLORIDE 0.9% FLUSH: 3.0000 mL | Freq: Two times a day (BID) | INTRAVENOUS | Status: DC

## 2016-06-24 MED ORDER — FENTANYL CITRATE (PF) 100 MCG/2ML IJ SOLN
INTRAMUSCULAR | Status: AC
  Filled 2016-06-24: qty 2

## 2016-06-24 MED ORDER — ASPIRIN 81 MG PO CHEW: 81.0000 mg | CHEWABLE_TABLET | ORAL | Status: DC

## 2016-06-24 MED ORDER — ONDANSETRON HCL 4 MG/2ML IJ SOLN
4.0000 mg | Freq: Once | INTRAMUSCULAR | Status: AC
  Administered 2016-06-24: 4 mg via INTRAVENOUS

## 2016-06-24 MED ORDER — SODIUM CHLORIDE 0.9% FLUSH: 3.0000 mL | INTRAVENOUS | Status: DC | PRN

## 2016-06-24 MED ORDER — DIPHENHYDRAMINE HCL 50 MG/ML IJ SOLN
INTRAMUSCULAR | Status: AC
  Filled 2016-06-24: qty 1

## 2016-06-24 MED ORDER — FENTANYL CITRATE (PF) 100 MCG/2ML IJ SOLN
INTRAMUSCULAR | Status: DC | PRN
  Administered 2016-06-24: 50 ug via INTRAVENOUS
  Administered 2016-06-24 (×2): 25 ug via INTRAVENOUS

## 2016-06-24 MED ORDER — HYDRALAZINE HCL 20 MG/ML IJ SOLN
INTRAMUSCULAR | Status: DC | PRN
  Administered 2016-06-24: 10 mg via INTRAVENOUS

## 2016-06-24 MED ORDER — ASPIRIN 81 MG PO CHEW
CHEWABLE_TABLET | ORAL | Status: AC
  Administered 2016-06-24: 81 mg
  Filled 2016-06-24: qty 1

## 2016-06-24 MED ORDER — SODIUM CHLORIDE 0.9 % WEIGHT BASED INFUSION: 1.0000 mL/kg/h | INTRAVENOUS | Status: AC

## 2016-06-24 MED ORDER — HYDRALAZINE HCL 20 MG/ML IJ SOLN
INTRAMUSCULAR | Status: AC
  Filled 2016-06-24: qty 1

## 2016-06-24 MED ORDER — METFORMIN HCL 500 MG PO TABS: 500.0000 mg | ORAL_TABLET | Freq: Two times a day (BID) | ORAL | Status: DC

## 2016-06-24 MED ORDER — SODIUM CHLORIDE 0.9 % WEIGHT BASED INFUSION: 1.0000 mL/kg/h | INTRAVENOUS | Status: DC

## 2016-06-24 MED ORDER — LABETALOL HCL 5 MG/ML IV SOLN
INTRAVENOUS | Status: AC
  Filled 2016-06-24: qty 4

## 2016-06-24 MED ORDER — SODIUM CHLORIDE 0.9 % WEIGHT BASED INFUSION
3.0000 mL/kg/h | INTRAVENOUS | Status: DC
Start: 2016-06-25 — End: 2016-06-24
  Administered 2016-06-24: 3 mL/kg/h via INTRAVENOUS

## 2016-06-24 MED ORDER — SODIUM CHLORIDE 0.9 % IV SOLN: 250.0000 mL | INTRAVENOUS | Status: DC | PRN

## 2016-06-24 MED ORDER — DIPHENHYDRAMINE HCL 50 MG/ML IJ SOLN
12.5000 mg | Freq: Once | INTRAMUSCULAR | Status: AC
  Administered 2016-06-24: 12.5 mg via INTRAVENOUS

## 2016-06-24 MED ORDER — ONDANSETRON HCL 4 MG/2ML IJ SOLN
INTRAMUSCULAR | Status: AC
  Filled 2016-06-24: qty 2

## 2016-06-24 MED ORDER — SODIUM CHLORIDE 0.9 % IV SOLN
250.0000 mL | INTRAVENOUS | Status: DC | PRN
Start: 2016-06-24 — End: 2016-06-24

## 2016-06-24 MED ORDER — FENTANYL CITRATE (PF) 100 MCG/2ML IJ SOLN: 50.0000 ug | INTRAMUSCULAR | Status: DC | PRN

## 2016-06-24 MED ORDER — LABETALOL HCL 5 MG/ML IV SOLN
INTRAVENOUS | Status: DC | PRN
Start: 2016-06-24 — End: 2016-06-24
  Administered 2016-06-24: 15 mg via INTRAVENOUS

## 2016-06-24 MED ORDER — IOPAMIDOL (ISOVUE-370) INJECTION 76%
INTRAVENOUS | Status: AC
  Filled 2016-06-24: qty 100

## 2016-06-24 MED ORDER — LIDOCAINE HCL (PF) 1 % IJ SOLN
INTRAMUSCULAR | Status: AC
  Filled 2016-06-24: qty 30

## 2016-06-24 MED ORDER — MIDAZOLAM HCL 2 MG/2ML IJ SOLN
INTRAMUSCULAR | Status: AC
  Filled 2016-06-24: qty 2

## 2016-06-24 MED ORDER — HEPARIN (PORCINE) IN NACL 2-0.9 UNIT/ML-% IJ SOLN
INTRAMUSCULAR | Status: DC | PRN
  Administered 2016-06-24: 1500 mL

## 2016-06-24 MED ORDER — IOPAMIDOL (ISOVUE-370) INJECTION 76%
INTRAVENOUS | Status: DC | PRN
  Administered 2016-06-24: 50 mL via INTRA_ARTERIAL

## 2016-06-24 MED ORDER — MIDAZOLAM HCL 2 MG/2ML IJ SOLN
INTRAMUSCULAR | Status: DC | PRN
  Administered 2016-06-24: 1 mg via INTRAVENOUS

## 2016-06-24 MED ORDER — LIDOCAINE HCL (PF) 1 % IJ SOLN
INTRAMUSCULAR | Status: DC | PRN
  Administered 2016-06-24: 15 mL

## 2016-06-24 SURGICAL SUPPLY — 14 items
CATH INFINITI 5FR MULTPACK ANG (CATHETERS) ×2 IMPLANT
CATH SWAN GANZ 7F STRAIGHT (CATHETERS) ×2 IMPLANT
GLIDESHEATH SLEND A-KIT 6F 20G (SHEATH) IMPLANT
KIT HEART LEFT (KITS) ×3 IMPLANT
PACK CARDIAC CATHETERIZATION (CUSTOM PROCEDURE TRAY) ×3 IMPLANT
SET INTRODUCER MICROPUNCT 5F (INTRODUCER) ×2 IMPLANT
SHEATH PINNACLE 5F 10CM (SHEATH) ×3 IMPLANT
SHEATH PINNACLE 7F 10CM (SHEATH) ×2 IMPLANT
TRANSDUCER W/STOPCOCK (MISCELLANEOUS) ×3 IMPLANT
TUBING CIL FLEX 10 FLL-RA (TUBING) ×3 IMPLANT
WIRE EMERALD 3MM-J .025X260CM (WIRE) ×2 IMPLANT
WIRE EMERALD 3MM-J .035X150CM (WIRE) ×2 IMPLANT
WIRE HI TORQ VERSACORE-J 145CM (WIRE) ×2 IMPLANT
WIRE SAFE-T 1.5MM-J .035X260CM (WIRE) IMPLANT

## 2016-06-24 NOTE — CV Procedure (Signed)
Procedure performed: Left heart catheterization including left and right coronary arteriogram. Right heart catheterization and palpation of cardiac index and output by Fick.  Coronary angiogram: Mild to moderate diffuse coronary artery disease without high-grade stenosis. Proximal LAD with a 40-50% stenosis, in some views at most 50-60% stenosed and calcified. Small circumflex, mildly diseased large right coronary artery.  Aortic valve not crossed. Difficulty in crossing. Patient has history of mild aortic stenosis by echocardiogram April 2017.  Right heart catheterization revealing mild pulmonary hypertension, PA mean pressure of 26 mmHg, wedge mean was 13 mmHg. Cardiac output 3.7, cardiac index 2.3 by Fick. There was no significant shunt, QP: QS 0.79.  Recommendation: Patient main complaint is dyspnea, consideration for pulmonary vasodilator therapy for pulmonary hypertension may be given in view of low wedge pressure and high PA mean pressure. Patient will be discharged home with outpatient follow-up.

## 2016-06-24 NOTE — Discharge Instructions (Signed)
NO METFORMIN/GLUCOPHAGE FOR 2 DAYS   Angiogram, Care After These instructions give you information about caring for yourself after your procedure. Your doctor may also give you more specific instructions. Call your doctor if you have any problems or questions after your procedure.  HOME CARE  Take medicines only as told by your doctor.  Follow your doctor's instructions about:  Care of the area where the tube was inserted.  Bandage (dressing) changes and removal.  You may shower 24-48 hours after the procedure or as told by your doctor.  Do not take baths, swim, or use a hot tub until your doctor approves.  Every day, check the area where the tube was inserted. Watch for:  Redness, swelling, or pain.  Fluid, blood, or pus.  Do not apply powder or lotion to the site.  Do not lift anything that is heavier than 10 lb (4.5 kg) for 5 days or as told by your doctor.  Ask your doctor when you can:  Return to work or school.  Do physical activities or play sports.  Have sex.  Do not drive or operate heavy machinery for 24 hours or as told by your doctor.  Have someone with you for the first 24 hours after the procedure.  Keep all follow-up visits as told by your doctor. This is important. GET HELP IF:  You have a fever.   You have chills.   You have more bleeding from the area where the tube was inserted. Hold pressure on the area.  You have redness, swelling, or pain in the area where the tube was inserted.  You have fluid or pus coming from the area. GET HELP RIGHT AWAY IF:   You have a lot of pain in the area where the tube was inserted.  The area where the tube was inserted is bleeding, and the bleeding does not stop after 30 minutes of holding steady pressure on the area.  The area near or just beyond the insertion site becomes pale, cool, tingly, or numb.   This information is not intended to replace advice given to you by your health care provider. Make  sure you discuss any questions you have with your health care provider.   Document Released: 03/13/2009 Document Revised: 01/05/2015 Document Reviewed: 05/18/2013 Elsevier Interactive Patient Education [employer].

## 2016-06-24 NOTE — Progress Notes (Signed)
Client states feels better and feels like face is swollen; face does not look swollen; no c/o chest pain color pink now; Dr Einar Gip notified of complaint and v/s and no new orders and ok to d/c home; client states feels like going home; client up and walked to bathroom and tolerated well; right groin stable no bleeding or hematoma; right groin bruised

## 2016-06-24 NOTE — Interval H&P Note (Signed)
History and Physical Interval Note:  06/24/2016 1:03 PM  [name]  has presented today for surgery, with the diagnosis of CAD, SOB  The various methods of treatment have been discussed with the patient and family. After consideration of risks, benefits and other options for treatment, the patient has consented to  Procedure(s): Right/Left Heart Cath and Coronary Angiography (N/A)  And possible PCI as a surgical intervention .  The patient's history has been reviewed, patient examined, no change in status, stable for surgery.  I have reviewed the patient's chart and labs.  Questions were answered to the patient's satisfaction.   Ischemic Symptoms? CCS III (Marked limitation of ordinary activity) Anti-ischemic Medical Therapy? Maximal Medical Therapy (2 or more classes of medications) Non-invasive Test Results? No non-invasive testing performed Prior CABG? No Previous CABG   Patient Information:   1-2V CAD, no prox LAD  A (7)  Indication: 20; Score: 7   Patient Information:   1-2V-CAD with DS 50-60% With No FFR, No IVUS  I (3)  Indication: 21; Score: 3   Patient Information:   1-2V-CAD with DS 50-60% With FFR  A (7)  Indication: 22; Score: 7   Patient Information:   1-2V-CAD with DS 50-60% With FFR>0.8, IVUS not significant  I (2)  Indication: 23; Score: 2   Patient Information:   3V-CAD without LMCA With Abnormal LV systolic function  A (9)  Indication: 48; Score: 9   Patient Information:   LMCA-CAD  A (9)  Indication: 49; Score: 9   Patient Information:   2V-CAD with prox LAD PCI  A (7)  Indication: 62; Score: 7   Patient Information:   2V-CAD with prox LAD CABG  A (8)  Indication: 62; Score: 8   Patient Information:   3V-CAD without LMCA With Low CAD burden(i.e., 3 focal stenoses, low SYNTAX score) PCI  A (7)  Indication: 63; Score: 7   Patient Information:   3V-CAD without LMCA With Low CAD burden(i.e., 3 focal stenoses,  low SYNTAX score) CABG  A (9)  Indication: 63; Score: 9   Patient Information:   3V-CAD without LMCA E06c - Intermediate-high CAD burden (i.e., multiple diffuse lesions, presence of CTO, or high SYNTAX score) PCI  U (4)  Indication: 64; Score: 4   Patient Information:   3V-CAD without LMCA E06c - Intermediate-high CAD burden (i.e., multiple diffuse lesions, presence of CTO, or high SYNTAX score) CABG  A (9)  Indication: 64; Score: 9   Patient Information:   LMCA-CAD With Isolated LMCA stenosis  PCI  U (6)  Indication: 65; Score: 6   Patient Information:   LMCA-CAD With Isolated LMCA stenosis  CABG  A (9)  Indication: 65; Score: 9   Patient Information:   LMCA-CAD Additional CAD, low CAD burden (i.e., 1- to 2-vessel additional involvement, low SYNTAX score) PCI  U (5)  Indication: 66; Score: 5   Patient Information:   LMCA-CAD Additional CAD, low CAD burden (i.e., 1- to 2-vessel additional involvement, low SYNTAX score) CABG  A (9)  Indication: 66; Score: 9   Patient Information:   LMCA-CAD Additional CAD, intermediate-high CAD burden (i.e., 3-vessel involvement, presence of CTO, or high SYNTAX score) PCI  I (3)  Indication: 67; Score: 3   Patient Information:   LMCA-CAD Additional CAD, intermediate-high CAD burden (i.e., 3-vessel involvement, presence of CTO, or high SYNTAX score) CABG  A (9)  Indication: 67; Score: 9   Nicholas Ossa

## 2016-06-24 NOTE — Progress Notes (Signed)
In and out cath of 400 cc clear yellow urine and tolerated well and states feels better

## 2016-06-24 NOTE — Progress Notes (Signed)
Site area: rt groin Site Prior to Removal:  Level  1 Pressure Applied For:  20 minutes  Manual:   yes Patient Status During Pull:  stable Post Pull Site:  Level  0 Post Pull Instructions Given:  yes Post Pull Pulses Present: yes Dressing Applied:  tegaderm Bedrest begins @  1500 Comments:  Hematoma resolved. Faint bruising where drap was

## 2016-06-24 NOTE — Progress Notes (Signed)
On arrival from cath lab holding moaning and c/o "not feeling good" and nausea

## 2016-07-23 ENCOUNTER — Encounter (HOSPITAL_COMMUNITY): Payer: Self-pay | Admitting: Emergency Medicine

## 2016-07-23 ENCOUNTER — Emergency Department (HOSPITAL_COMMUNITY)
Admission: EM | Admit: 2016-07-23 | Discharge: 2016-07-23 | Disposition: A | Discharge status: Home / Self Care | Payer: Medicare Other | Attending: Emergency Medicine | Admitting: Emergency Medicine

## 2016-07-23 DIAGNOSIS — Z7984 Long term (current) use of oral hypoglycemic drugs: Secondary | ICD-10-CM | POA: Diagnosis not present

## 2016-07-23 DIAGNOSIS — E039 Hypothyroidism, unspecified: Secondary | ICD-10-CM | POA: Diagnosis not present

## 2016-07-23 DIAGNOSIS — Z85118 Personal history of other malignant neoplasm of bronchus and lung: Secondary | ICD-10-CM | POA: Diagnosis not present

## 2016-07-23 DIAGNOSIS — I2510 Atherosclerotic heart disease of native coronary artery without angina pectoris: Secondary | ICD-10-CM | POA: Insufficient documentation

## 2016-07-23 DIAGNOSIS — E1129 Type 2 diabetes mellitus with other diabetic kidney complication: Secondary | ICD-10-CM | POA: Insufficient documentation

## 2016-07-23 DIAGNOSIS — Z79899 Other long term (current) drug therapy: Secondary | ICD-10-CM | POA: Diagnosis not present

## 2016-07-23 DIAGNOSIS — I129 Hypertensive chronic kidney disease with stage 1 through stage 4 chronic kidney disease, or unspecified chronic kidney disease: Secondary | ICD-10-CM | POA: Insufficient documentation

## 2016-07-23 DIAGNOSIS — Z7901 Long term (current) use of anticoagulants: Secondary | ICD-10-CM | POA: Diagnosis not present

## 2016-07-23 DIAGNOSIS — E1165 Type 2 diabetes mellitus with hyperglycemia: Secondary | ICD-10-CM | POA: Insufficient documentation

## 2016-07-23 DIAGNOSIS — E1122 Type 2 diabetes mellitus with diabetic chronic kidney disease: Secondary | ICD-10-CM | POA: Diagnosis not present

## 2016-07-23 DIAGNOSIS — Z8673 Personal history of transient ischemic attack (TIA), and cerebral infarction without residual deficits: Secondary | ICD-10-CM | POA: Insufficient documentation

## 2016-07-23 DIAGNOSIS — N183 Chronic kidney disease, stage 3 (moderate): Secondary | ICD-10-CM | POA: Diagnosis not present

## 2016-07-23 DIAGNOSIS — M7989 Other specified soft tissue disorders: Secondary | ICD-10-CM | POA: Diagnosis present

## 2016-07-23 DIAGNOSIS — L03114 Cellulitis of left upper limb: Secondary | ICD-10-CM | POA: Diagnosis not present

## 2016-07-23 LAB — CBC WITH DIFFERENTIAL/PLATELET
BASOS ABS: 0 10*3/uL (ref 0.0–0.1)
BASOS PCT: 0 %
EOS ABS: 0.3 10*3/uL (ref 0.0–0.7)
EOS PCT: 4 %
HCT: 37.1 % (ref 36.0–46.0)
Hemoglobin: 12.3 g/dL (ref 12.0–15.0)
LYMPHS ABS: 3 10*3/uL (ref 0.7–4.0)
Lymphocytes Relative: 36 %
MCH: 29 pg (ref 26.0–34.0)
MCHC: 33.2 g/dL (ref 30.0–36.0)
MCV: 87.5 fL (ref 78.0–100.0)
Monocytes Absolute: 0.9 10*3/uL (ref 0.1–1.0)
Monocytes Relative: 11 %
Neutro Abs: 4 10*3/uL (ref 1.7–7.7)
Neutrophils Relative %: 49 %
PLATELETS: 115 10*3/uL — AB (ref 150–400)
RBC: 4.24 MIL/uL (ref 3.87–5.11)
RDW: 13.8 % (ref 11.5–15.5)
WBC: 8.2 10*3/uL (ref 4.0–10.5)

## 2016-07-23 LAB — BASIC METABOLIC PANEL
Anion gap: 14 (ref 5–15)
BUN: 13 mg/dL (ref 6–20)
CO2: 25 mmol/L (ref 22–32)
Calcium: 9.4 mg/dL (ref 8.9–10.3)
Chloride: 99 mmol/L — ABNORMAL LOW (ref 101–111)
Creatinine, Ser: 0.91 mg/dL (ref 0.44–1.00)
GFR, EST NON AFRICAN AMERICAN: 58 mL/min — AB (ref >60)
Glucose, Bld: 243 mg/dL — ABNORMAL HIGH (ref 65–99)
POTASSIUM: 3.8 mmol/L (ref 3.5–5.1)
SODIUM: 138 mmol/L (ref 135–145)

## 2016-07-23 LAB — I-STAT CG4 LACTIC ACID, ED: LACTIC ACID, VENOUS: 1.49 mmol/L (ref 0.5–1.9)

## 2016-07-23 MED ORDER — DIPHENHYDRAMINE HCL 50 MG/ML IJ SOLN
25.0000 mg | Freq: Once | INTRAMUSCULAR | Status: AC
  Administered 2016-07-23: 25 mg via INTRAVENOUS
  Filled 2016-07-23: qty 1

## 2016-07-23 MED ORDER — CLINDAMYCIN PHOSPHATE 600 MG/50ML IV SOLN
600.0000 mg | Freq: Once | INTRAVENOUS | Status: AC
  Administered 2016-07-23: 600 mg via INTRAVENOUS
  Filled 2016-07-23: qty 50

## 2016-07-23 MED ORDER — CLINDAMYCIN HCL 300 MG PO CAPS: 300.0000 mg | ORAL_CAPSULE | Freq: Three times a day (TID) | ORAL | 0 refills | Status: DC

## 2016-07-23 NOTE — ED Triage Notes (Signed)
Pt. stung by a wasp last Monday , presents with redness/swelling and itching at right hand extending to forearm , airway intact / respirations unlabored .

## 2016-07-23 NOTE — ED Notes (Signed)
Husband at bedside.  

## 2016-07-23 NOTE — Discharge Instructions (Signed)
Stay hydrated.   Take clindamycin three times daily for a week.   Avoid bee stings.   See your doctor   Return to ER if you have fever, worse redness or swelling, numbness in your arm, purulent discharge from wound.

## 2016-07-23 NOTE — ED Provider Notes (Signed)
[facility] DEPT Provider Note   CSN: [id number] Arrival date & time: 07/23/16  6195  First Provider Contact:  First MD Initiated Contact with Patient 07/23/16 [phone]        History   Chief Complaint Chief Complaint  Patient presents with  . Insect Bite    HPI [name] is a 82 y.o. female.  The history is provided by the patient.  [name] is a 82 y.o. female hx of DM, HTN, lung cancer, CAD, here with R hand swelling. Patient states that she was bitten by a wasp 3 days ago on the right hand. She noticed progressive swelling since yesterday and redness spreading from the hand to the forearm. Since last night, she has more itchiness and pain. Also noticed more swelling at spreading up her arm. Denies any fevers or chills. She is diabetic but denies any previous skin infections. He has been applying hydrocortisone cream as well as triamcinolone cream on it with no relief.   Past Medical History:  Diagnosis Date  . CAD (coronary artery disease) 03/13/2012  . Diabetes mellitus   . Dysrhythmia    ATRIAL FIB  . HTN (hypertension) 03/13/2012  . Hypothyroid 03/13/2012  . Lung cancer ([place])   . Shortness of breath   . Stroke ([place])   . Syncope and collapse 04/02/2016  . Type II or unspecified type diabetes mellitus without mention of complication, not stated as uncontrolled 03/13/2012    Patient Active Problem List   Diagnosis Date Noted  . Atrial fibrillation with RVR ([place])   . Diabetes mellitus with complication ([place])   . Faintness   . Thrombocytopenia ([place]) 04/07/2016  . Anemia 04/07/2016  . SOB (shortness of breath) on exertion   . Syncope and collapse   . Syncope 04/02/2016  . Physical deconditioning 04/20/2015  . Dyspnea 04/17/2015  . SOB (shortness of breath) 04/17/2015  . Acute renal failure superimposed on stage 3 chronic kidney disease ([place]) 04/17/2015  . Hypokalemia 04/17/2015  . Atrial fibrillation ([facility]) CHA2DS2-VASc Score 7 03/13/2012  . HTN  (hypertension) 03/13/2012  . Hypothyroid 03/13/2012  . DM (diabetes mellitus), type 2, uncontrolled, with renal complications ([place]) 67/11/4579  . CAD (coronary artery disease) 03/13/2012  . Carcinoid tumor of lung   . Stroke [facility])     Past Surgical History:  Procedure Laterality Date  . ABDOMINAL HYSTERECTOMY    . BREAST SURGERY     benign tumors removed in 1980  . CARDIAC CATHETERIZATION    . CARDIOVERSION  04/20/2012   Procedure: CARDIOVERSION;  Surgeon: Laverda Page, MD;  Location: [facility];  Service: Cardiovascular;  Laterality: N/A;  . CARDIOVERSION N/A 05/10/2013   Procedure: CARDIOVERSION;  Surgeon: Laverda Page, MD;  Location: [facility];  Service: Cardiovascular;  Laterality: N/A;  . CHOLECYSTECTOMY    . HEMORRHOID SURGERY    . jaw replacement    . LEFT HEART CATHETERIZATION WITH CORONARY ANGIOGRAM N/A 04/20/2015   Procedure: LEFT HEART CATHETERIZATION WITH CORONARY ANGIOGRAM;  Surgeon: Adrian Prows, MD;  Location: [facility] C[facility] LAB;  Service: Cardiovascular;  Laterality: N/A;  . tumors     back of head    OB History    No data available       Home Medications    Prior to Admission medications   Medication Sig Start Date End Date Taking? Authorizing Provider  acetaminophen (TYLENOL) 500 MG tablet Take 500 mg by mouth every 6 (six) hours as needed for moderate pain or headache.  Historical Provider, MD  atorvastatin (LIPITOR) 40 MG tablet Take 1 tablet (40 mg total) by mouth daily. 04/20/15   Janece Canterbury, MD  Calcium Carb-Cholecalciferol (CALCIUM-VITAMIN D3) 600-500 MG-UNIT CAPS Take 1 capsule by mouth daily.    Historical Provider, MD  digoxin (LANOXIN) 0.125 MG tablet Take 0.125 mg by mouth daily.    Historical Provider, MD  diltiazem (CARDIZEM CD) 180 MG 24 hr capsule Take 1 capsule (180 mg total) by mouth daily. 04/20/15   Janece Canterbury, MD  fluticasone (CUTIVATE) 0.05 % cream Apply topically 2 (two) times daily. 06/13/16   Billy Fischer, MD  hydrOXYzine  (VISTARIL) 25 MG capsule Take 25 mg by mouth at bedtime. 03/31/16   Historical Provider, MD  isosorbide mononitrate (IMDUR) 60 MG 24 hr tablet Take 1 tablet (60 mg total) by mouth daily. 04/20/15   Janece Canterbury, MD  levothyroxine (SYNTHROID, LEVOTHROID) 150 MCG tablet Take 150 mcg by mouth daily before breakfast.    Historical Provider, MD  metFORMIN (GLUCOPHAGE) 500 MG tablet Take 1 tablet (500 mg total) by mouth 2 (two) times daily with a meal. 06/26/16   Adrian Prows, MD  metoprolol (LOPRESSOR) 50 MG tablet Take 50 mg by mouth 2 (two) times daily.    Historical Provider, MD  omeprazole (PRILOSEC) 20 MG capsule Take 20 mg by mouth daily.    Historical Provider, MD  potassium chloride SA (K-DUR,KLOR-CON) 20 MEQ tablet Take 20 mEq by mouth daily.    Historical Provider, MD  Rivaroxaban (XARELTO) 15 MG TABS tablet Take 15 mg by mouth daily with supper. 03/16/12   Adrian Prows, MD    Family History Family History  Problem Relation Age of Onset  . Breast cancer Mother   . Aneurysm Father     Likely thoracic aneurysm per patient  . Melanoma Brother   . Heart disease Sister   . Diabetes type II Sister     Social History Social History  Substance Use Topics  . Smoking status: Never Smoker  . Smokeless tobacco: Never Used  . Alcohol use No     Allergies   Codeine and Hydrocodone-acetaminophen   Review of Systems Review of Systems  Skin: Positive for rash.  All other systems reviewed and are negative.    Physical Exam Updated Vital Signs BP 131/84   Pulse 106   Temp 97.9 F (36.6 C) (Oral)   Resp 18   Ht '5\' 3"'$  (1.6 m)   Wt 125 lb 4.8 oz (56.8 kg)   SpO2 100%   BMI 22.20 kg/m   Physical Exam  Constitutional:  Uncomfortable   HENT:  Head: Normocephalic.  Eyes: EOM are normal.  Neck: Normal range of motion.  Cardiovascular: Normal rate.   Pulmonary/Chest: Effort normal and breath sounds normal.  Abdominal: Soft. Bowel sounds are normal.  Musculoskeletal:  R hand and  forearm swelling with redness. No fluctuance at the bite site. There is redness involving entire forearm, no streaking up the upper arm or axillary tender lymph nodes. Neurovascular intact   Neurological: She is alert.  Skin: Skin is warm. There is erythema.  Nursing note and vitals reviewed.    ED Treatments / Results  Labs (all labs ordered are listed, but only abnormal results are displayed) Labs Reviewed  CBC WITH DIFFERENTIAL/PLATELET - Abnormal; Notable for the following:       Result Value   Platelets 115 (*)    All other components within normal limits  BASIC METABOLIC PANEL - Abnormal; Notable for  the following:    Chloride 99 (*)    Glucose, Bld 243 (*)    GFR calc non Af Amer 58 (*)    All other components within normal limits  I-STAT CG4 LACTIC ACID, ED    EKG  EKG Interpretation None       Radiology  No results found.  Procedures Procedures (including critical care time)  Medications Ordered in ED Medications  clindamycin (CLEOCIN) IVPB 600 mg (0 mg Intravenous Stopped 07/23/16 0959)  diphenhydrAMINE (BENADRYL) injection 25 mg (25 mg Intravenous Given 07/23/16 0836)     Initial Impression / Assessment and Plan / ED Course  I have reviewed the triage vital signs and the nursing notes.  Pertinent labs & imaging results that were available during my care of the patient were reviewed by me and considered in my medical decision making (see chart for details).  Clinical Course   [name] is a 82 y.o. female here with R arm cellulitis after bug bite. Afebrile, well appearing. Hx of DM and cellulitis extends to entire forearm. No crepitance or fluctuance. Will get labs, lactate. Will give clinda empirically    10:11 AM WBC and lactate nl. Given clinda. Gave strict return precautions.   Final Clinical Impressions(s) / ED Diagnoses   Final diagnoses:  None    New Prescriptions New Prescriptions   No medications on file     Drenda Freeze,  MD 07/23/16 1012

## 2016-07-24 ENCOUNTER — Telehealth (HOSPITAL_COMMUNITY): Payer: Self-pay

## 2016-11-17 ENCOUNTER — Ambulatory Visit (HOSPITAL_COMMUNITY)
Admission: EM | Admit: 2016-11-17 | Discharge: 2016-11-17 | Disposition: A | Discharge status: Home / Self Care | Payer: Medicare Other | Attending: Emergency Medicine | Admitting: Emergency Medicine

## 2016-11-17 ENCOUNTER — Encounter (HOSPITAL_COMMUNITY): Payer: Self-pay | Admitting: Emergency Medicine

## 2016-11-17 DIAGNOSIS — R21 Rash and other nonspecific skin eruption: Secondary | ICD-10-CM | POA: Diagnosis not present

## 2016-11-17 MED ORDER — PERMETHRIN 5 % EX CREA: TOPICAL_CREAM | CUTANEOUS | 0 refills | Status: DC

## 2016-11-17 NOTE — Discharge Instructions (Signed)
Given your duration of symptoms and the diffuse nature, I am going to treat you for something called scabies, just to make sure this is not causing all your itching.  Apply the cream from you neck down to your feet, leave on for 8-14 hours then wash off.  After that, continue to use the clobetasol daily. Try taking Zyrtec instead of loratadine for the itching. Apply the Bactroban to your buttocks to ensure that you are not going to get an infection from scratching.  Make an appointment to follow up with your primary care provider in the next 1-2 days.

## 2016-11-17 NOTE — ED Provider Notes (Signed)
CSN: [id number]     Arrival date & time 11/17/16  1301 History   First MD Initiated Contact with Patient 11/17/16 1343     Chief Complaint  Patient presents with  . 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  She notes it is diffuse, it has been on her back, then her neck/shoulders, and now it's on her chest and legs.  She notes she feels like "bugs are biting her."  She notes her scalp is very bothersoem now, she notes she has bumps from where she's been scratching so much. No issues between the digits on her hands or feet. She sometimes notes a "little pimple" and once she scratches, the pimples get more prominent. She denies that this started with a rash. No drainage, erythema, or warmth. She endorses subjective fevers and chills.   She's tried Sarna cream, clobetasol, and Bactroban without improvement. No one else in the home has these symptoms. She notes she had a blood transfusion back in May and wonders if they gave her the wrong blood. Denies any changes in lotions, laundry detergent, or soaps. She notes significant changes in her medications, but none to correlate temporally with onset of her rash.  Past Medical History:  Diagnosis Date  . CAD (coronary artery disease) 03/13/2012  . Diabetes mellitus   . Dysrhythmia    ATRIAL FIB  . HTN (hypertension) 03/13/2012  . Hypothyroid 03/13/2012  . Lung cancer ([place])   . Shortness of breath   . Stroke ([place])   . Syncope and collapse 04/02/2016  . Type II or unspecified type diabetes mellitus without mention of complication, not stated as uncontrolled 03/13/2012   Past Surgical History:  Procedure Laterality Date  . ABDOMINAL HYSTERECTOMY    .  BREAST SURGERY     benign tumors removed in 1980  . CARDIAC CATHETERIZATION    . CARDIOVERSION  04/20/2012   Procedure: CARDIOVERSION;  Surgeon: Laverda Page, MD;  Location: [place];  Service: Cardiovascular;  Laterality: N/A;  . CARDIOVERSION N/A 05/10/2013   Procedure: CARDIOVERSION;  Surgeon: Laverda Page, MD;  Location: [place];  Service: Cardiovascular;  Laterality: N/A;  . CHOLECYSTECTOMY    . HEMORRHOID SURGERY    . jaw replacement    . LEFT HEART CATHETERIZATION WITH CORONARY ANGIOGRAM N/A 04/20/2015   Procedure: LEFT HEART CATHETERIZATION WITH CORONARY ANGIOGRAM;  Surgeon: Adrian Prows, MD;  Location: [facility] C[facility] LAB;  Service: Cardiovascular;  Laterality: N/A;  . tumors     back of head   Family History  Problem Relation Age of Onset  . Breast cancer Mother   . Aneurysm Father     Likely thoracic aneurysm per patient  . Melanoma Brother   . Heart disease Sister   . Diabetes type II Sister    Social History  Substance Use Topics  . Smoking status: Never Smoker  . Smokeless tobacco: Never Used  . Alcohol use No   OB History    No data available     Review of Systems  Constitutional: Positive for chills and fever. Negative for activity change, appetite change, diaphoresis and fatigue.  HENT: Negative.   Eyes: Negative for photophobia and redness.  Respiratory: Negative for cough, chest tightness, shortness of breath and stridor.  Cardiovascular: Negative for chest pain and palpitations.  Gastrointestinal: Negative.   Endocrine: Negative.   Genitourinary: Negative.   Musculoskeletal: Negative for arthralgias, joint swelling, neck pain and neck stiffness.  Skin: Positive for rash.  Allergic/Immunologic: Negative for immunocompromised state.  Neurological: Negative.   Hematological: Does not bruise/bleed easily.  Psychiatric/Behavioral: Negative.     Allergies  Codeine and Hydrocodone-acetaminophen  Home Medications   Prior to Admission medications    Medication Sig Start Date End Date Taking? Authorizing Provider  diltiazem (CARDIZEM CD) 180 MG 24 hr capsule Take 1 capsule (180 mg total) by mouth daily. 04/20/15  Yes Janece Canterbury, MD  glimepiride (AMARYL) 2 MG tablet Take 2 mg by mouth daily with breakfast.   Yes Historical Provider, MD  levothyroxine (SYNTHROID, LEVOTHROID) 150 MCG tablet Take 150 mcg by mouth daily before breakfast.   Yes Historical Provider, MD  meclizine (ANTIVERT) 25 MG tablet Take 25 mg by mouth 3 (three) times daily as needed for dizziness.   Yes Historical Provider, MD  metoprolol (LOPRESSOR) 50 MG tablet Take 50 mg by mouth 2 (two) times daily.   Yes Historical Provider, MD  OVER THE COUNTER MEDICATION Multiple creams: sarna, clobetasol, triamcinolone, fluticasone cream   Yes Historical Provider, MD  Rivaroxaban (XARELTO) 15 MG TABS tablet Take 15 mg by mouth daily with supper. 03/16/12  Yes Adrian Prows, MD  acetaminophen (TYLENOL) 500 MG tablet Take 500 mg by mouth every 6 (six) hours as needed for moderate pain or headache.     Historical Provider, MD  atorvastatin (LIPITOR) 40 MG tablet Take 1 tablet (40 mg total) by mouth daily. Patient not taking: Reported on 11/17/2016 04/20/15   Janece Canterbury, MD  Calcium Carb-Cholecalciferol (CALCIUM-VITAMIN D3) 600-500 MG-UNIT CAPS Take 1 capsule by mouth daily.    Historical Provider, MD  clindamycin (CLEOCIN) 300 MG capsule Take 1 capsule (300 mg total) by mouth 3 (three) times daily. X 7 days Patient not taking: Reported on 11/17/2016 07/23/16   Drenda Freeze, MD  digoxin (LANOXIN) 0.125 MG tablet Take 0.125 mg by mouth daily.    Historical Provider, MD  fluticasone (CUTIVATE) 0.05 % cream Apply topically 2 (two) times daily. 06/13/16   Billy Fischer, MD  hydrOXYzine (VISTARIL) 25 MG capsule Take 25 mg by mouth at bedtime. 03/31/16   Historical Provider, MD  isosorbide mononitrate (IMDUR) 60 MG 24 hr tablet Take 1 tablet (60 mg total) by mouth daily. 04/20/15   Janece Canterbury, MD  metFORMIN (GLUCOPHAGE) 500 MG tablet Take 1 tablet (500 mg total) by mouth 2 (two) times daily with a meal. Patient not taking: Reported on 11/17/2016 06/26/16   Adrian Prows, MD  omeprazole (PRILOSEC) 20 MG capsule Take 20 mg by mouth daily.    Historical Provider, MD  permethrin (ACTICIN) 5 % cream Apply to entire body from neck down, leave on for 8-14 hours, then wash off 11/17/16   Archie Patten, MD  potassium chloride SA (K-DUR,KLOR-CON) 20 MEQ tablet Take 20 mEq by mouth daily.    Historical Provider, MD   Meds Ordered and Administered this Visit  Medications - No data to display  BP 134/82 (BP Location: Left Arm)   Pulse 95   Temp 97.8 F (36.6 C) (Oral)   Resp 18   SpO2 100% 5 No data found.   Physical Exam  Constitutional: She is oriented to person, place, and time. She appears well-developed and well-nourished. No distress.  HENT:  Head: Normocephalic.  Nose: Nose  normal.  Mouth/Throat: Oropharynx is clear and moist.  Eyes: Conjunctivae are normal. Right eye exhibits no discharge. Left eye exhibits no discharge. No scleral icterus.  Neck: Neck supple.  Cardiovascular: Normal rate and regular rhythm.  Exam reveals no gallop and no friction rub.   No murmur heard. Pulmonary/Chest: Effort normal and breath sounds normal. No respiratory distress. She has no wheezes. She has no rales.  Abdominal: Soft. She exhibits no distension.  Musculoskeletal: She exhibits no edema or deformity.  Neurological: She is alert and oriented to person, place, and time. She exhibits normal muscle tone.  Skin: Skin is warm and dry. She is not diaphoretic.  Several scabbed over areas on posterior aspect of neck and scalp. 1 small scab over anterior chest wall. Small erythematous scabbed over papules noted over the left gluteus without warmth or drainage    Urgent Care Course   Clinical Course     Procedures (including critical care time)  Labs Review Labs Reviewed - No data to  display  Imaging Review No results found.    MDM   1. Rash    This is an 82 year old female with past medical history of type 2 diabetes presenting with pruritus that has been intermittent for the last 6 months. She denies any temporal relationship to changes in remainder medications. She does have excoriations and scabbing secondary to scratching, however no evidence of underlying infection. Given the duration of time, for the intermittent use of her topical medications without improvement, will treat empirically for scabies. Her surgeon for permethrin cream with instructions provided. Advised patient to continue with clobetasol a scheduled basis in addition to Zyrtec instead of loratadine. Advised her that she could continue to use the Bactroban on her buttocks, to prevent superimposed infection from her excoriations. We discussed return precautions. The patient will follow up with her PCP within the next few days.    Archie Patten, MD 11/17/16 1500

## 2016-11-17 NOTE — ED Triage Notes (Signed)
Reports general itching that has been bothering her since 03/2016.  Itching is worse and spreading.

## 2016-12-12 ENCOUNTER — Inpatient Hospital Stay (HOSPITAL_COMMUNITY)
Admission: EM | Admit: 2016-12-12 | Discharge: 2016-12-17 | DRG: 310 | Disposition: A | Discharge status: Home Health | Payer: Medicare Other | Attending: Cardiovascular Disease | Admitting: Cardiovascular Disease

## 2016-12-12 ENCOUNTER — Encounter (HOSPITAL_COMMUNITY): Payer: Self-pay

## 2016-12-12 DIAGNOSIS — I10 Essential (primary) hypertension: Secondary | ICD-10-CM | POA: Diagnosis present

## 2016-12-12 DIAGNOSIS — T383X5A Adverse effect of insulin and oral hypoglycemic [antidiabetic] drugs, initial encounter: Secondary | ICD-10-CM | POA: Diagnosis present

## 2016-12-12 DIAGNOSIS — Z85118 Personal history of other malignant neoplasm of bronchus and lung: Secondary | ICD-10-CM

## 2016-12-12 DIAGNOSIS — I350 Nonrheumatic aortic (valve) stenosis: Secondary | ICD-10-CM | POA: Diagnosis present

## 2016-12-12 DIAGNOSIS — I2510 Atherosclerotic heart disease of native coronary artery without angina pectoris: Secondary | ICD-10-CM | POA: Diagnosis present

## 2016-12-12 DIAGNOSIS — R21 Rash and other nonspecific skin eruption: Secondary | ICD-10-CM

## 2016-12-12 DIAGNOSIS — I482 Chronic atrial fibrillation: Secondary | ICD-10-CM | POA: Diagnosis not present

## 2016-12-12 DIAGNOSIS — E119 Type 2 diabetes mellitus without complications: Secondary | ICD-10-CM | POA: Diagnosis present

## 2016-12-12 DIAGNOSIS — E039 Hypothyroidism, unspecified: Secondary | ICD-10-CM | POA: Diagnosis present

## 2016-12-12 DIAGNOSIS — I4891 Unspecified atrial fibrillation: Secondary | ICD-10-CM | POA: Diagnosis present

## 2016-12-12 DIAGNOSIS — L270 Generalized skin eruption due to drugs and medicaments taken internally: Secondary | ICD-10-CM | POA: Diagnosis present

## 2016-12-12 DIAGNOSIS — Z8673 Personal history of transient ischemic attack (TIA), and cerebral infarction without residual deficits: Secondary | ICD-10-CM

## 2016-12-12 DIAGNOSIS — F419 Anxiety disorder, unspecified: Secondary | ICD-10-CM | POA: Diagnosis present

## 2016-12-12 LAB — CBC WITH DIFFERENTIAL/PLATELET
BASOS ABS: 0 10*3/uL (ref 0.0–0.1)
Basophils Relative: 1 %
EOS ABS: 0.2 10*3/uL (ref 0.0–0.7)
Eosinophils Relative: 3 %
HCT: 35.8 % — ABNORMAL LOW (ref 36.0–46.0)
HEMOGLOBIN: 12.8 g/dL (ref 12.0–15.0)
LYMPHS ABS: 2.3 10*3/uL (ref 0.7–4.0)
LYMPHS PCT: 38 %
MCH: 30.8 pg (ref 26.0–34.0)
MCHC: 35.8 g/dL (ref 30.0–36.0)
MCV: 86.1 fL (ref 78.0–100.0)
Monocytes Absolute: 0.7 10*3/uL (ref 0.1–1.0)
Monocytes Relative: 11 %
NEUTROS PCT: 47 %
Neutro Abs: 2.8 10*3/uL (ref 1.7–7.7)
Platelets: 102 10*3/uL — ABNORMAL LOW (ref 150–400)
RBC: 4.16 MIL/uL (ref 3.87–5.11)
RDW: 12.9 % (ref 11.5–15.5)
WBC: 6.1 10*3/uL (ref 4.0–10.5)

## 2016-12-12 LAB — COMPREHENSIVE METABOLIC PANEL
ALK PHOS: 75 U/L (ref 38–126)
ALT: 16 U/L (ref 14–54)
AST: 21 U/L (ref 15–41)
Albumin: 3.9 g/dL (ref 3.5–5.0)
Anion gap: 9 (ref 5–15)
BUN: 11 mg/dL (ref 6–20)
CALCIUM: 9 mg/dL (ref 8.9–10.3)
CO2: 25 mmol/L (ref 22–32)
CREATININE: 0.88 mg/dL (ref 0.44–1.00)
Chloride: 108 mmol/L (ref 101–111)
GFR calc non Af Amer: >60 mL/min (ref >60)
GLUCOSE: 153 mg/dL — AB (ref 65–99)
Potassium: 2.9 mmol/L — ABNORMAL LOW (ref 3.5–5.1)
SODIUM: 142 mmol/L (ref 135–145)
Total Bilirubin: 0.9 mg/dL (ref 0.3–1.2)
Total Protein: 6.4 g/dL — ABNORMAL LOW (ref 6.5–8.1)

## 2016-12-12 LAB — I-STAT CHEM 8, ED
BUN: 11 mg/dL (ref 6–20)
CALCIUM ION: 1.11 mmol/L — AB (ref 1.15–1.40)
CREATININE: 0.9 mg/dL (ref 0.44–1.00)
Chloride: 105 mmol/L (ref 101–111)
GLUCOSE: 147 mg/dL — AB (ref 65–99)
HEMATOCRIT: 36 % (ref 36.0–46.0)
HEMOGLOBIN: 12.2 g/dL (ref 12.0–15.0)
Potassium: 2.9 mmol/L — ABNORMAL LOW (ref 3.5–5.1)
Sodium: 142 mmol/L (ref 135–145)
TCO2: 23 mmol/L (ref 0–100)

## 2016-12-12 LAB — MRSA PCR SCREENING: MRSA by PCR: NEGATIVE

## 2016-12-12 LAB — TSH: TSH: 0.04 u[IU]/mL — ABNORMAL LOW (ref 0.350–4.500)

## 2016-12-12 LAB — BRAIN NATRIURETIC PEPTIDE: B Natriuretic Peptide: 207.9 pg/mL — ABNORMAL HIGH (ref 0.0–100.0)

## 2016-12-12 MED ORDER — CALCIUM CARBONATE-VITAMIN D 500-200 MG-UNIT PO TABS
1.0000 | ORAL_TABLET | Freq: Every day | ORAL | Status: DC
  Administered 2016-12-13 – 2016-12-17 (×5): 1 via ORAL
  Filled 2016-12-12 (×6): qty 1

## 2016-12-12 MED ORDER — LEVOTHYROXINE SODIUM 150 MCG PO TABS
150.0000 ug | ORAL_TABLET | Freq: Every day | ORAL | Status: DC
  Filled 2016-12-12: qty 1
  Filled 2016-12-12: qty 2

## 2016-12-12 MED ORDER — HYDROXYZINE HCL 25 MG PO TABS
25.0000 mg | ORAL_TABLET | Freq: Every evening | ORAL | Status: DC | PRN
  Administered 2016-12-12: 25 mg via ORAL

## 2016-12-12 MED ORDER — DILTIAZEM HCL ER COATED BEADS 180 MG PO CP24
180.0000 mg | ORAL_CAPSULE | Freq: Every day | ORAL | Status: DC
  Administered 2016-12-12 – 2016-12-14 (×3): 180 mg via ORAL
  Filled 2016-12-12 (×3): qty 1

## 2016-12-12 MED ORDER — DOCUSATE SODIUM 100 MG PO CAPS
100.0000 mg | ORAL_CAPSULE | Freq: Two times a day (BID) | ORAL | Status: DC
  Administered 2016-12-13 – 2016-12-17 (×8): 100 mg via ORAL
  Filled 2016-12-12 (×9): qty 1

## 2016-12-12 MED ORDER — MECLIZINE HCL 25 MG PO TABS: 25.0000 mg | ORAL_TABLET | Freq: Three times a day (TID) | ORAL | Status: DC | PRN

## 2016-12-12 MED ORDER — GLIMEPIRIDE 2 MG PO TABS
2.0000 mg | ORAL_TABLET | Freq: Every day | ORAL | Status: DC
  Administered 2016-12-13 – 2016-12-16 (×4): 2 mg via ORAL
  Filled 2016-12-12: qty 1
  Filled 2016-12-12 (×3): qty 2
  Filled 2016-12-12 (×3): qty 1
  Filled 2016-12-12: qty 2

## 2016-12-12 MED ORDER — POTASSIUM CHLORIDE CRYS ER 20 MEQ PO TBCR
20.0000 meq | EXTENDED_RELEASE_TABLET | Freq: Three times a day (TID) | ORAL | Status: DC
  Administered 2016-12-12 – 2016-12-15 (×10): 20 meq via ORAL
  Filled 2016-12-12 (×10): qty 1

## 2016-12-12 MED ORDER — SODIUM CHLORIDE 0.9 % IV SOLN
INTRAVENOUS | Status: DC
  Administered 2016-12-13: 500 mL via INTRAVENOUS

## 2016-12-12 MED ORDER — ONDANSETRON HCL 4 MG PO TABS: 4.0000 mg | ORAL_TABLET | Freq: Four times a day (QID) | ORAL | Status: DC | PRN

## 2016-12-12 MED ORDER — METOPROLOL TARTRATE 25 MG PO TABS
50.0000 mg | ORAL_TABLET | Freq: Two times a day (BID) | ORAL | Status: DC
  Administered 2016-12-12 – 2016-12-15 (×5): 50 mg via ORAL
  Filled 2016-12-12 (×6): qty 2

## 2016-12-12 MED ORDER — PERMETHRIN 5 % EX CREA: TOPICAL_CREAM | CUTANEOUS | 0 refills | Status: DC

## 2016-12-12 MED ORDER — DILTIAZEM HCL 100 MG IV SOLR
5.0000 mg/h | Freq: Once | INTRAVENOUS | Status: AC
  Administered 2016-12-12: 5 mg/h via INTRAVENOUS
  Filled 2016-12-12: qty 100

## 2016-12-12 MED ORDER — DILTIAZEM HCL ER COATED BEADS 180 MG PO CP24
180.0000 mg | ORAL_CAPSULE | Freq: Once | ORAL | Status: AC
  Administered 2016-12-12: 180 mg via ORAL
  Filled 2016-12-12: qty 1

## 2016-12-12 MED ORDER — PANTOPRAZOLE SODIUM 40 MG PO TBEC
40.0000 mg | DELAYED_RELEASE_TABLET | Freq: Every day | ORAL | Status: DC
  Administered 2016-12-13 – 2016-12-17 (×5): 40 mg via ORAL
  Filled 2016-12-12 (×5): qty 1

## 2016-12-12 MED ORDER — CALCIUM-VITAMIN D3 600-500 MG-UNIT PO CAPS: 1.0000 | ORAL_CAPSULE | Freq: Every day | ORAL | Status: DC

## 2016-12-12 MED ORDER — RIVAROXABAN 15 MG PO TABS
15.0000 mg | ORAL_TABLET | Freq: Every day | ORAL | Status: DC
  Administered 2016-12-12 – 2016-12-16 (×5): 15 mg via ORAL
  Filled 2016-12-12 (×5): qty 1

## 2016-12-12 MED ORDER — ATORVASTATIN CALCIUM 40 MG PO TABS
40.0000 mg | ORAL_TABLET | Freq: Every day | ORAL | Status: DC
  Administered 2016-12-12 – 2016-12-16 (×5): 40 mg via ORAL
  Filled 2016-12-12 (×5): qty 1

## 2016-12-12 MED ORDER — SODIUM CHLORIDE 0.9 % IV BOLUS (SEPSIS)
250.0000 mL | Freq: Once | INTRAVENOUS | Status: AC
  Administered 2016-12-12: 250 mL via INTRAVENOUS

## 2016-12-12 MED ORDER — DILTIAZEM HCL 25 MG/5ML IV SOLN
10.0000 mg | Freq: Once | INTRAVENOUS | Status: AC
  Administered 2016-12-12: 10 mg via INTRAVENOUS
  Filled 2016-12-12: qty 5

## 2016-12-12 MED ORDER — ISOSORBIDE MONONITRATE ER 30 MG PO TB24
60.0000 mg | ORAL_TABLET | Freq: Every day | ORAL | Status: DC
  Administered 2016-12-13 – 2016-12-15 (×3): 60 mg via ORAL
  Filled 2016-12-12 (×3): qty 2

## 2016-12-12 MED ORDER — POTASSIUM CHLORIDE 2 MEQ/ML IV SOLN
30.0000 meq | Freq: Once | INTRAVENOUS | Status: AC
  Administered 2016-12-12: 30 meq via INTRAVENOUS
  Filled 2016-12-12: qty 15

## 2016-12-12 MED ORDER — ONDANSETRON HCL 4 MG/2ML IJ SOLN
4.0000 mg | Freq: Four times a day (QID) | INTRAMUSCULAR | Status: DC | PRN
Start: 2016-12-12 — End: 2016-12-17

## 2016-12-12 MED ORDER — FLUTICASONE PROPIONATE 0.05 % EX CREA: 1.0000 "application " | TOPICAL_CREAM | Freq: Two times a day (BID) | CUTANEOUS | Status: DC | PRN

## 2016-12-12 MED ORDER — DIGOXIN 125 MCG PO TABS
0.1250 mg | ORAL_TABLET | Freq: Every day | ORAL | Status: DC
  Administered 2016-12-12 – 2016-12-17 (×6): 0.125 mg via ORAL
  Filled 2016-12-12 (×6): qty 1

## 2016-12-12 MED ORDER — HYDROCORTISONE 1 % EX CREA
TOPICAL_CREAM | Freq: Two times a day (BID) | CUTANEOUS | Status: DC | PRN
  Administered 2016-12-12: 1 via TOPICAL
  Filled 2016-12-12 (×3): qty 28

## 2016-12-12 MED ORDER — HYDROXYZINE PAMOATE 25 MG PO CAPS
25.0000 mg | ORAL_CAPSULE | Freq: Every evening | ORAL | Status: DC | PRN
  Filled 2016-12-12: qty 1

## 2016-12-12 NOTE — ED Notes (Signed)
Patient HR remains elevated

## 2016-12-12 NOTE — H&P (Addendum)
Referring Physician:  [name] is an 82 y.o. female.                       Chief Complaint: Atrial fibrillation with RVR.  HPI: 82 year old female presented with rash over back and legs with itching. She was found to in atrial fibrillation with rapid ventricular response. IV diltiazem improved heart rate which was not controlled by her oral B-blocker and diltiazem. She has PMH of CAD, DM, II, Hypertension, hypothyroidism, lung cancer and stroke.  Past Medical History:  Diagnosis Date  . CAD (coronary artery disease) 03/13/2012  . Diabetes mellitus   . Dysrhythmia    ATRIAL FIB  . HTN (hypertension) 03/13/2012  . Hypothyroid 03/13/2012  . Lung cancer ([place])   . Shortness of breath   . Stroke ([place])   . Syncope and collapse 04/02/2016  . Type II or unspecified type diabetes mellitus without mention of complication, not stated as uncontrolled 03/13/2012      Past Surgical History:  Procedure Laterality Date  . ABDOMINAL HYSTERECTOMY    . BREAST SURGERY     benign tumors removed in 1980  . CARDIAC CATHETERIZATION    . CARDIOVERSION  04/20/2012   Procedure: CARDIOVERSION;  Surgeon: Deanna Page, MD;  Location: [place];  Service: Cardiovascular;  Laterality: N/A;  . CARDIOVERSION N/A 05/10/2013   Procedure: CARDIOVERSION;  Surgeon: Deanna Page, MD;  Location: [place];  Service: Cardiovascular;  Laterality: N/A;  . CHOLECYSTECTOMY    . HEMORRHOID SURGERY    . jaw replacement    . LEFT HEART CATHETERIZATION WITH CORONARY ANGIOGRAM N/A 04/20/2015   Procedure: LEFT HEART CATHETERIZATION WITH CORONARY ANGIOGRAM;  Surgeon: Deanna Prows, MD;  Location: [facility] CATH LAB;  Service: Cardiovascular;  Laterality: N/A;  . tumors     back of head    Family History  Problem Relation Age of Onset  . Breast cancer Mother   . Aneurysm Father     Likely thoracic aneurysm per patient  . Melanoma Brother   . Heart disease Sister   . Diabetes type II Sister    Social History:  reports that  she has never smoked. She has never used smokeless tobacco. She reports that she does not drink alcohol or use drugs.  Allergies:  Allergies  Allergen Reactions  . Codeine Nausea And Vomiting  . Hydrocodone-Acetaminophen Nausea And Vomiting     (Not in a hospital admission)  Results for orders placed or performed during the hospital encounter of 12/12/16 (from the past 48 hour(s))  CBC with Differential/Platelet     Status: Abnormal   Collection Time: 12/12/16  3:47 PM  Result Value Ref Range   WBC 6.1 4.0 - 10.5 K/uL   RBC 4.16 3.87 - 5.11 MIL/uL   Hemoglobin 12.8 12.0 - 15.0 g/dL   HCT 35.8 (L) 36.0 - 46.0 %   MCV 86.1 78.0 - 100.0 fL   MCH 30.8 26.0 - 34.0 pg   MCHC 35.8 30.0 - 36.0 g/dL   RDW 12.9 11.5 - 15.5 %   Platelets 102 (L) 150 - 400 K/uL    Comment: REPEATED TO VERIFY PLATELET COUNT CONFIRMED BY SMEAR    Neutrophils Relative % 47 %   Neutro Abs 2.8 1.7 - 7.7 K/uL   Lymphocytes Relative 38 %   Lymphs Abs 2.3 0.7 - 4.0 K/uL   Monocytes Relative 11 %   Monocytes Absolute 0.7 0.1 - 1.0 K/uL  Eosinophils Relative 3 %   Eosinophils Absolute 0.2 0.0 - 0.7 K/uL   Basophils Relative 1 %   Basophils Absolute 0.0 0.0 - 0.1 K/uL  Comprehensive metabolic panel     Status: Abnormal   Collection Time: 12/12/16  3:47 PM  Result Value Ref Range   Sodium 142 135 - 145 mmol/L   Potassium 2.9 (L) 3.5 - 5.1 mmol/L   Chloride 108 101 - 111 mmol/L   CO2 25 22 - 32 mmol/L   Glucose, Bld 153 (H) 65 - 99 mg/dL   BUN 11 6 - 20 mg/dL   Creatinine, Ser 0.88 0.44 - 1.00 mg/dL   Calcium 9.0 8.9 - 10.3 mg/dL   Total Protein 6.4 (L) 6.5 - 8.1 g/dL   Albumin 3.9 3.5 - 5.0 g/dL   AST 21 15 - 41 U/L   ALT 16 14 - 54 U/L   Alkaline Phosphatase 75 38 - 126 U/L   Total Bilirubin 0.9 0.3 - 1.2 mg/dL   GFR calc non Af Amer >60 >60 mL/min   GFR calc Af Amer >60 >60 mL/min    Comment: (NOTE) The eGFR has been calculated using the CKD EPI equation. This calculation has not been validated  in all clinical situations. eGFR's persistently <60 mL/min signify possible Chronic Kidney Disease.    Anion gap 9 5 - 15  I-stat Chem 8, ED     Status: Abnormal   Collection Time: 12/12/16  3:49 PM  Result Value Ref Range   Sodium 142 135 - 145 mmol/L   Potassium 2.9 (L) 3.5 - 5.1 mmol/L   Chloride 105 101 - 111 mmol/L   BUN 11 6 - 20 mg/dL   Creatinine, Ser 0.90 0.44 - 1.00 mg/dL   Glucose, Bld 147 (H) 65 - 99 mg/dL   Calcium, Ion 1.11 (L) 1.15 - 1.40 mmol/L   TCO2 23 0 - 100 mmol/L   Hemoglobin 12.2 12.0 - 15.0 g/dL   HCT 36.0 36.0 - 46.0 %   No results found.  Review Of Systems Constitutional: No fever, chills , weight loss or gain. Eyes: vision change, Wears glasses. Has bilateral cataract surgery, No discharge or pain.. Ears: No hearing loss, No tinnitus. Respiratory: No asthma, COPD, pneumonias or shortness of breath. No hemoptysis. Cardiovascular: No chest pain, Positive palpitation and ankle edema. Gastrointestinal: No nausea, vomiting or diarrhea or constipation. No GI bleed. No hepatitis. Genitourinary: No dysuria, hematuria or kidney stone. No incontinance. Neurological: No headache, positive stroke or seizures.  Psychiatry: No psych facility admission for anxiety, depression or suicide. No detox. Skin: Positive rash. Musculoskeletal: Positive joint pain. No fibromyalgia. No neck pain or back pain. Lymphadenopathy: No lymphadenopathy Hematology: No anemia or easy bruising.   Blood pressure 138/76, pulse 116, resp. rate 16, height _0  (1.6 m), weight 56.7 kg (125 lb), SpO2 96 %. Body mass index is 22.14 kg/m. General appearance: alert, cooperative, appears stated age and no distress Head: Normocephalic, atraumatic. Eyes: Blue-green eyes, pink conjunctivae/corneas clear. PERRL, EOM's intact.  Neck: no adenopathy, no carotid bruit, no JVD, supple, symmetrical, trachea midline and thyroid not enlarged. Resp: clear to auscultation bilaterally Cardio: Irregular  rate and rhythm, S1, S2 normal, II/VI systolic murmur, no click, rub or gallop GI: soft, non-tender; bowel sounds normal; no masses,  no organomegaly Extremities: extremities normal, atraumatic, no cyanosis or edema Skin: Warm and dry. Areas of erythema over back and legs. Neurologic: Alert and oriented X 3, normal strength and tone.   Assessment/Plan  Atrial fibrillation, chronic with RVR,[id number] score of 7/9 Hypertension DM, II Skin rash CAD Stroke Hypothyroidism  Place in observation/Check TSH/ Adjust medications/ Continue Xarelto.  Birdie Riddle, MD  12/12/2016, 7:55 PM

## 2016-12-12 NOTE — ED Provider Notes (Addendum)
[facility] DEPT Provider Note   CSN: [id number] Arrival date & time: 12/12/16  1001     History   Chief Complaint Chief Complaint  Patient presents with  . Rash    HPI [name] is a 82 y.o. female.  Patient been suffering with an itchy dermatitis type rash off for several months. Most recently in November seen at urgent care given a trial of permethrin. She used it appropriately but only applied it once. Patient is followed by Dr. Wilson Singer primary care. Patient has not seen dermatology for this. The rash is all over her body predominantly on the buttocks area and on face back and scalp. No fevers no systemic symptoms.      Past Medical History:  Diagnosis Date  . CAD (coronary artery disease) 03/13/2012  . Diabetes mellitus   . Dysrhythmia    ATRIAL FIB  . HTN (hypertension) 03/13/2012  . Hypothyroid 03/13/2012  . Lung cancer ([place])   . Shortness of breath   . Stroke ([place])   . Syncope and collapse 04/02/2016  . Type II or unspecified type diabetes mellitus without mention of complication, not stated as uncontrolled 03/13/2012    Patient Active Problem List   Diagnosis Date Noted  . Atrial fibrillation with RVR ([place])   . Diabetes mellitus with complication ([place])   . Faintness   . Thrombocytopenia ([place]) 04/07/2016  . Anemia 04/07/2016  . SOB (shortness of breath) on exertion   . Syncope and collapse   . Syncope 04/02/2016  . Physical deconditioning 04/20/2015  . Dyspnea 04/17/2015  . SOB (shortness of breath) 04/17/2015  . Acute renal failure superimposed on stage 3 chronic kidney disease ([place]) 04/17/2015  . Hypokalemia 04/17/2015  . Atrial fibrillation (HCC) CHA2DS2-VASc Score 7 03/13/2012  . HTN (hypertension) 03/13/2012  . Hypothyroid 03/13/2012  . DM (diabetes mellitus), type 2, uncontrolled, with renal complications ([place]) 02/40/9735  . CAD (coronary artery disease) 03/13/2012  . Carcinoid tumor of lung   . Stroke [facility])     Past Surgical History:   Procedure Laterality Date  . ABDOMINAL HYSTERECTOMY    . BREAST SURGERY     benign tumors removed in 1980  . CARDIAC CATHETERIZATION    . CARDIOVERSION  04/20/2012   Procedure: CARDIOVERSION;  Surgeon: Laverda Page, MD;  Location: [place];  Service: Cardiovascular;  Laterality: N/A;  . CARDIOVERSION N/A 05/10/2013   Procedure: CARDIOVERSION;  Surgeon: Laverda Page, MD;  Location: [place];  Service: Cardiovascular;  Laterality: N/A;  . CHOLECYSTECTOMY    . HEMORRHOID SURGERY    . jaw replacement    . LEFT HEART CATHETERIZATION WITH CORONARY ANGIOGRAM N/A 04/20/2015   Procedure: LEFT HEART CATHETERIZATION WITH CORONARY ANGIOGRAM;  Surgeon: Adrian Prows, MD;  Location: [facility] CATH LAB;  Service: Cardiovascular;  Laterality: N/A;  . tumors     back of head    OB History    No data available       Home Medications    Prior to Admission medications   Medication Sig Start Date End Date Taking? Authorizing Provider  acetaminophen (TYLENOL) 500 MG tablet Take 500 mg by mouth every 6 (six) hours as needed for moderate pain or headache.     Historical Provider, MD  atorvastatin (LIPITOR) 40 MG tablet Take 1 tablet (40 mg total) by mouth daily. Patient not taking: Reported on 11/17/2016 04/20/15   Janece Canterbury, MD  Calcium Carb-Cholecalciferol (CALCIUM-VITAMIN D3) 600-500 MG-UNIT CAPS Take 1 capsule by mouth daily.  Historical Provider, MD  clindamycin (CLEOCIN) 300 MG capsule Take 1 capsule (300 mg total) by mouth 3 (three) times daily. X 7 days Patient not taking: Reported on 11/17/2016 07/23/16   Drenda Freeze, MD  digoxin (LANOXIN) 0.125 MG tablet Take 0.125 mg by mouth daily.    Historical Provider, MD  diltiazem (CARDIZEM CD) 180 MG 24 hr capsule Take 1 capsule (180 mg total) by mouth daily. 04/20/15   Janece Canterbury, MD  fluticasone (CUTIVATE) 0.05 % cream Apply topically 2 (two) times daily. 06/13/16   Billy Fischer, MD  glimepiride (AMARYL) 2 MG tablet Take 2 mg by mouth  daily with breakfast.    Historical Provider, MD  hydrOXYzine (VISTARIL) 25 MG capsule Take 25 mg by mouth at bedtime. 03/31/16   Historical Provider, MD  isosorbide mononitrate (IMDUR) 60 MG 24 hr tablet Take 1 tablet (60 mg total) by mouth daily. 04/20/15   Janece Canterbury, MD  levothyroxine (SYNTHROID, LEVOTHROID) 150 MCG tablet Take 150 mcg by mouth daily before breakfast.    Historical Provider, MD  meclizine (ANTIVERT) 25 MG tablet Take 25 mg by mouth 3 (three) times daily as needed for dizziness.    Historical Provider, MD  metFORMIN (GLUCOPHAGE) 500 MG tablet Take 1 tablet (500 mg total) by mouth 2 (two) times daily with a meal. Patient not taking: Reported on 11/17/2016 06/26/16   Adrian Prows, MD  metoprolol (LOPRESSOR) 50 MG tablet Take 50 mg by mouth 2 (two) times daily.    Historical Provider, MD  omeprazole (PRILOSEC) 20 MG capsule Take 20 mg by mouth daily.    Historical Provider, MD  OVER THE COUNTER MEDICATION Multiple creams: sarna, clobetasol, triamcinolone, fluticasone cream    Historical Provider, MD  permethrin (ACTICIN) 5 % cream Apply to entire body from neck down, leave on for 8-14 hours, then wash off 11/17/16   Archie Patten, MD  permethrin (ELIMITE) 5 % cream Apply to entire body,wash off after 8-14 hours, THEN repeat in 1 week. 12/12/16   Fredia Sorrow, MD  potassium chloride SA (K-DUR,KLOR-CON) 20 MEQ tablet Take 20 mEq by mouth daily.    Historical Provider, MD  Rivaroxaban (XARELTO) 15 MG TABS tablet Take 15 mg by mouth daily with supper. 03/16/12   Adrian Prows, MD    Family History Family History  Problem Relation Age of Onset  . Breast cancer Mother   . Aneurysm Father     Likely thoracic aneurysm per patient  . Melanoma Brother   . Heart disease Sister   . Diabetes type II Sister     Social History Social History  Substance Use Topics  . Smoking status: Never Smoker  . Smokeless tobacco: Never Used  . Alcohol use No     Allergies   Codeine and  Hydrocodone-acetaminophen   Review of Systems Review of Systems  Constitutional: Negative for fever.  HENT: Negative for congestion.   Eyes: Negative for visual disturbance.  Respiratory: Negative for shortness of breath.   Cardiovascular: Negative for chest pain.  Gastrointestinal: Negative for abdominal pain.  Genitourinary: Negative for difficulty urinating.  Musculoskeletal: Negative for back pain.  Skin: Positive for rash.  Neurological: Negative for headaches.  Hematological: Bruises/bleeds easily.  Psychiatric/Behavioral: Negative for confusion.     Physical Exam Updated Vital Signs BP 132/80   Pulse (!) 140   Resp 15   Ht '5\' 3"'$  (1.6 m)   Wt 56.7 kg   SpO2 98%   BMI 22.14 kg/m  Physical Exam  Constitutional: She is oriented to person, place, and time. She appears well-developed and well-nourished.  Eyes: Conjunctivae and EOM are normal. Pupils are equal, round, and reactive to light.  Neck: Normal range of motion. Neck supple.  Cardiovascular: Normal rate and regular rhythm.   Pulmonary/Chest: Breath sounds normal. No respiratory distress.  Abdominal: Soft. Bowel sounds are normal. There is no tenderness.  Musculoskeletal: Normal range of motion. She exhibits no edema.  Neurological: She is alert and oriented to person, place, and time.  Skin: Rash noted.  On back face and scalp small spherical areas may be about 5 mm in size with some erythema no streaking. On the buttocks area bilaterally some excoriated skin may be due to scratching. No evidence of hives. No vesicles.  Nursing note and vitals reviewed.    ED Treatments / Results  Labs (all labs ordered are listed, but only abnormal results are displayed) Labs Reviewed - No data to display  EKG  EKG Interpretation None       Radiology No results found.  Procedures Procedures (including critical care time)  Medications Ordered in ED Medications - No data to display   Initial Impression /  Assessment and Plan / ED Course  I have reviewed the triage vital signs and the nursing notes.  Pertinent labs & imaging results that were available during my care of the patient were reviewed by me and considered in my medical decision making (see chart for details).  Clinical Course     Patient's dermatitis and itchy rash is not clearly related to scabies patient did have a treatment with permethrin but never did do a repeat in a week will going give trial of trying it again 21 week apart but really needs follow-up with dermatology. Patient nontoxic no acute distress.   Final Clinical Impressions(s) / ED Diagnoses   Final diagnoses:  Rash  Atrial fibrillation with RVR (HCC)    New Prescriptions New Prescriptions   PERMETHRIN (ELIMITE) 5 % CREAM    Apply to entire body,wash off after 8-14 hours, THEN repeat in 1 week.     Fredia Sorrow, MD 12/12/16 1258   Addendum:  It was noted at the discharge the patient's heart rate was in the 140s. EKG showed atrial fibrillation. Patient with known history of atrophic fibrillation. Patient normally on long-acting Cardizem. Patient did not take any of her medicines this morning. Patient given oral dose of her typical cardiac exam. Heart rate is not slowing down. Still around 130s. Patient in no acute distress.  Patient no longer on blood thinners.  We'll go ahead and give IV dose of Cardizem. I will also get basic labs. Patient may very well require admission for the atrial fibrillation with rapid ventricular rate.  Patient's cardiologist is Dr. Einar Gip.   Fredia Sorrow, MD 12/12/16 1524

## 2016-12-12 NOTE — ED Triage Notes (Signed)
Pt. Was seen at the Urgent care on 11/20 and diagnosed with scabies.  She used the medication and aafter 3 days she symptoms came back  She reports that she has patches all over her body .    She is using epsom salts.  The itching is severe

## 2016-12-12 NOTE — ED Notes (Signed)
Finger probe had been reading pulse inconsistently. Hooked up pt to cardiac monitor to verify vitals prior to d/c and HR confirmed as elevated. Attending MD notified.

## 2016-12-12 NOTE — ED Notes (Signed)
ED Provider at bedside. 

## 2016-12-12 NOTE — ED Notes (Signed)
Notified Pharmacy for medication.

## 2016-12-12 NOTE — ED Provider Notes (Signed)
I received this patient in signout from Dr. Helane Gunther, who had evaluated her for a rash and she had an incidental finding of A. fib with RVR. She was given her home medication as well as a bolus of IV diltiazem and we were observing for improvement. The patient's labs show potassium 2.9, gave IV potassium repletion. On reexamination, the patient's heart rate had not improved and continued to be in the 130s. She denies any complaints. Initiated diltiazem drip. The patient follows with Dr. Einar Gip. I discussed w/ Dr. Doylene Canard, who is covering for Dr. Einar Gip. He will evaluate pt and admit for management of A fib w/ RVR.  CRITICAL CARE Performed by: Wenda Overland Little   Total critical care time: 30 minutes  Critical care time was exclusive of separately billable procedures and treating other patients.  Critical care was necessary to treat or prevent imminent or life-threatening deterioration.  Critical care was time spent personally by me on the following activities: development of treatment plan with patient and/or surrogate as well as nursing, discussions with consultants, evaluation of patient's response to treatment, examination of patient, obtaining history from patient or surrogate, ordering and performing treatments and interventions, ordering and review of laboratory studies, ordering and review of radiographic studies, pulse oximetry and re-evaluation of patient's condition.    Sharlett Iles, MD 12/12/16 [phone]

## 2016-12-13 ENCOUNTER — Encounter (HOSPITAL_COMMUNITY): Payer: Self-pay | Admitting: *Deleted

## 2016-12-13 LAB — URINALYSIS, ROUTINE W REFLEX MICROSCOPIC
BACTERIA UA: NONE SEEN
BILIRUBIN URINE: NEGATIVE
Glucose, UA: NEGATIVE mg/dL
Hgb urine dipstick: NEGATIVE
KETONES UR: NEGATIVE mg/dL
Nitrite: NEGATIVE
PH: 6 (ref 5.0–8.0)
PROTEIN: NEGATIVE mg/dL
Specific Gravity, Urine: 1.011 (ref 1.005–1.030)

## 2016-12-13 LAB — CBC
HEMATOCRIT: 34.1 % — AB (ref 36.0–46.0)
HEMOGLOBIN: 11.9 g/dL — AB (ref 12.0–15.0)
MCH: 30.2 pg (ref 26.0–34.0)
MCHC: 34.9 g/dL (ref 30.0–36.0)
MCV: 86.5 fL (ref 78.0–100.0)
Platelets: 98 10*3/uL — ABNORMAL LOW (ref 150–400)
RBC: 3.94 MIL/uL (ref 3.87–5.11)
RDW: 12.6 % (ref 11.5–15.5)
WBC: 4.5 10*3/uL (ref 4.0–10.5)

## 2016-12-13 LAB — BASIC METABOLIC PANEL
Anion gap: 8 (ref 5–15)
BUN: 12 mg/dL (ref 6–20)
CALCIUM: 8.3 mg/dL — AB (ref 8.9–10.3)
CO2: 21 mmol/L — ABNORMAL LOW (ref 22–32)
Chloride: 107 mmol/L (ref 101–111)
Creatinine, Ser: 0.92 mg/dL (ref 0.44–1.00)
GFR calc non Af Amer: 57 mL/min — ABNORMAL LOW (ref >60)
Glucose, Bld: 297 mg/dL — ABNORMAL HIGH (ref 65–99)
POTASSIUM: 3.7 mmol/L (ref 3.5–5.1)
Sodium: 136 mmol/L (ref 135–145)

## 2016-12-13 LAB — GLUCOSE, CAPILLARY
GLUCOSE-CAPILLARY: 282 mg/dL — AB (ref 65–99)
GLUCOSE-CAPILLARY: 185 mg/dL — AB (ref 65–99)
Glucose-Capillary: 154 mg/dL — ABNORMAL HIGH (ref 65–99)

## 2016-12-13 MED ORDER — HYDROXYZINE HCL 25 MG PO TABS
25.0000 mg | ORAL_TABLET | Freq: Four times a day (QID) | ORAL | Status: DC | PRN
  Administered 2016-12-13 – 2016-12-16 (×8): 25 mg via ORAL
  Filled 2016-12-13 (×8): qty 1

## 2016-12-13 MED ORDER — METFORMIN HCL 500 MG PO TABS
500.0000 mg | ORAL_TABLET | Freq: Two times a day (BID) | ORAL | Status: DC
  Administered 2016-12-13 – 2016-12-16 (×7): 500 mg via ORAL
  Filled 2016-12-13 (×6): qty 1

## 2016-12-13 MED ORDER — HYDROCORTISONE 1 % EX CREA
TOPICAL_CREAM | Freq: Two times a day (BID) | CUTANEOUS | Status: DC | PRN
  Administered 2016-12-13: 1 via TOPICAL
  Administered 2016-12-15: 01:00:00 via TOPICAL
  Administered 2016-12-16 (×2): 1 via TOPICAL
  Filled 2016-12-13 (×3): qty 28

## 2016-12-13 MED ORDER — DEXTROSE 5 % IV SOLN
INTRAVENOUS | Status: AC
  Administered 2016-12-13: 03:00:00
  Filled 2016-12-13: qty 100

## 2016-12-13 MED ORDER — LEVOTHYROXINE SODIUM 100 MCG PO TABS
100.0000 ug | ORAL_TABLET | Freq: Every day | ORAL | Status: DC
  Administered 2016-12-13 – 2016-12-17 (×5): 100 ug via ORAL
  Filled 2016-12-13 (×4): qty 1

## 2016-12-13 MED ORDER — INSULIN ASPART 100 UNIT/ML ~~LOC~~ SOLN
0.0000 [IU] | Freq: Three times a day (TID) | SUBCUTANEOUS | Status: DC
Start: 2016-12-13 — End: 2016-12-17
  Administered 2016-12-13: 5 [IU] via SUBCUTANEOUS
  Administered 2016-12-13 – 2016-12-15 (×5): 2 [IU] via SUBCUTANEOUS
  Administered 2016-12-15: 1 [IU] via SUBCUTANEOUS
  Administered 2016-12-16 (×2): 2 [IU] via SUBCUTANEOUS
  Administered 2016-12-17: 1 [IU] via SUBCUTANEOUS

## 2016-12-13 MED ORDER — ALPRAZOLAM 0.25 MG PO TABS
0.2500 mg | ORAL_TABLET | Freq: Two times a day (BID) | ORAL | Status: DC
  Administered 2016-12-13 – 2016-12-14 (×2): 0.25 mg via ORAL
  Filled 2016-12-13 (×2): qty 1

## 2016-12-13 NOTE — Progress Notes (Signed)
Ref: Dwan Bolt, MD   Subjective:  Improving heart rate control. Patient lost 5 family member or close friends in 1 year. Admits to stress from that contributing to itching..  Objective:  Vital Signs in the last 24 hours: Temp:  [97.9 F (36.6 C)-98.3 F (36.8 C)] 98.3 F (36.8 C) (12/16 0430) Pulse Rate:  [60-142] 92 (12/16 0906) Cardiac Rhythm: Atrial fibrillation (12/16 0800) Resp:  [11-24] 16 (12/16 0430) BP: (97-152)/(64-104) 140/98 (12/16 0906) SpO2:  [92 %-100 %] 92 % (12/16 0430) Weight:  [56.7 kg (125 lb)-58.4 kg (128 lb 11.2 oz)] 57.7 kg (127 lb 3.2 oz) (12/16 0430)  Physical Exam: BP Readings from Last 1 Encounters:  12/13/16 (!) 140/98    Wt Readings from Last 1 Encounters:  12/13/16 57.7 kg (127 lb 3.2 oz)    Weight change:  Body mass index is 22.53 kg/m. HEENT: [place]/AT, Eyes-Blue-Green, PERL, EOMI, Conjunctiva-Pink, Sclera-Non-icteric Neck: No JVD, No bruit, Trachea midline. Lungs:  Clear, Bilateral. Cardiac:  Irregular rhythm, normal S1 and S2, no S3. II/VI systolic murmur. Abdomen:  Soft, non-tender. BS present. Extremities:  No edema present. No cyanosis. No clubbing. CNS: AxOx3, Cranial nerves grossly intact, moves all 4 extremities.  Skin: Warm and dry.   Intake/Output from previous day: 12/15 0701 - 12/16 0700 In: 1060 [P.O.:360; I.V.:450; IV Piggyback:250] Out: 901 [Urine:900; Stool:1]    Lab Results: BMET    Component Value Date/Time   NA 136 12/13/2016 0316   NA 142 12/12/2016 1549   NA 142 12/12/2016 1547   K 3.7 12/13/2016 0316   K 2.9 (L) 12/12/2016 1549   K 2.9 (L) 12/12/2016 1547   CL 107 12/13/2016 0316   CL 105 12/12/2016 1549   CL 108 12/12/2016 1547   CO2 21 (L) 12/13/2016 0316   CO2 25 12/12/2016 1547   CO2 25 07/23/2016 0744   GLUCOSE 297 (H) 12/13/2016 0316   GLUCOSE 147 (H) 12/12/2016 1549   GLUCOSE 153 (H) 12/12/2016 1547   BUN 12 12/13/2016 0316   BUN 11 12/12/2016 1549   BUN 11 12/12/2016 1547   CREATININE  0.92 12/13/2016 0316   CREATININE 0.90 12/12/2016 1549   CREATININE 0.88 12/12/2016 1547   CALCIUM 8.3 (L) 12/13/2016 0316   CALCIUM 9.0 12/12/2016 1547   CALCIUM 9.4 07/23/2016 0744   GFRNONAA 57 (L) 12/13/2016 0316   GFRNONAA >60 12/12/2016 1547   GFRNONAA 58 (L) 07/23/2016 0744   GFRAA >60 12/13/2016 0316   GFRAA >60 12/12/2016 1547   GFRAA >60 07/23/2016 0744   CBC    Component Value Date/Time   WBC 4.5 12/13/2016 0316   RBC 3.94 12/13/2016 0316   HGB 11.9 (L) 12/13/2016 0316   HCT 34.1 (L) 12/13/2016 0316   PLT 98 (L) 12/13/2016 0316   MCV 86.5 12/13/2016 0316   MCH 30.2 12/13/2016 0316   MCHC 34.9 12/13/2016 0316   RDW 12.6 12/13/2016 0316   LYMPHSABS 2.3 12/12/2016 1547   MONOABS 0.7 12/12/2016 1547   EOSABS 0.2 12/12/2016 1547   BASOSABS 0.0 12/12/2016 1547   HEPATIC Function Panel  Recent Labs  04/07/16 1620 04/09/16 0952 12/12/16 1547  PROT 5.8* 5.8* 6.4*   HEMOGLOBIN A1C No components found for: HGA1C,  MPG CARDIAC ENZYMES Lab Results  Component Value Date   CKTOTAL 66 03/14/2012   CKMB 2.0 03/14/2012   TROPONINI 0.10 (H) 04/08/2016   TROPONINI 0.14 (H) 04/07/2016   TROPONINI <0.30 03/14/2012   BNP No results for input(s): PROBNP in the  last 8760 hours. TSH  Recent Labs  04/07/16 1822 12/12/16 2241  TSH 0.031* 0.040*   CHOLESTEROL No results for input(s): CHOL in the last 8760 hours.  Scheduled Meds: . ALPRAZolam  0.25 mg Oral BID  . atorvastatin  40 mg Oral q1800  . calcium-vitamin D  1 tablet Oral Q breakfast  . digoxin  0.125 mg Oral Daily  . diltiazem  180 mg Oral Daily  . docusate sodium  100 mg Oral BID  . glimepiride  2 mg Oral Q breakfast  . insulin aspart  0-9 Units Subcutaneous TID WC  . isosorbide mononitrate  60 mg Oral Daily  . levothyroxine  100 mcg Oral QAC breakfast  . metFORMIN  500 mg Oral BID WC  . metoprolol  50 mg Oral BID  . pantoprazole  40 mg Oral Daily  . potassium chloride SA  20 mEq Oral TID  .  Rivaroxaban  15 mg Oral Q supper   Continuous Infusions: PRN Meds:.hydrocortisone cream, hydrOXYzine, meclizine, ondansetron **OR** ondansetron (ZOFRAN) IV  Assessment/Plan: Atrial fibrillation, chronic Hypertension DM, II Skin rash CAD Stroke Hypothyroidism Iatrogenic hyperthyroidism Anxiety  Decrease thyroid dose to 100 mcg. Add xanax for anxiety.   LOS: 0 days    Dixie Dials  MD  12/13/2016, 10:17 AM

## 2016-12-13 NOTE — Discharge Instructions (Addendum)
Highly recommend follow-up with dermatology. Contact your primary care doctor for referral information. Give another trial of the permethrin cream just in case it scabies.  Information on my medicine - XARELTO (Rivaroxaban)  This medication education was reviewed with me or my healthcare representative as part of my discharge preparation.  The pharmacist that spoke with me during my hospital stay was:  Einar Grad, [facility]  Why was Xarelto prescribed for you? Xarelto was prescribed for you to reduce the risk of a blood clot forming that can cause a stroke if you have a medical condition called atrial fibrillation (a type of irregular heartbeat).  What do you need to know about xarelto ? Take your Xarelto ONCE DAILY at the same time every day with your evening meal. If you have difficulty swallowing the tablet whole, you may crush it and mix in applesauce just prior to taking your dose.  Take Xarelto exactly as prescribed by your doctor and DO NOT stop taking Xarelto without talking to the doctor who prescribed the medication.  Stopping without other stroke prevention medication to take the place of Xarelto may increase your risk of developing a clot that causes a stroke.  Refill your prescription before you run out.  After discharge, you should have regular check-up appointments with your healthcare provider that is prescribing your Xarelto.  In the future your dose may need to be changed if your kidney function or weight changes by a significant amount.  What do you do if you miss a dose? If you are taking Xarelto ONCE DAILY and you miss a dose, take it as soon as you remember on the same day then continue your regularly scheduled once daily regimen the next day. Do not take two doses of Xarelto at the same time or on the same day.   Important Safety Information A possible side effect of Xarelto is bleeding. You should call your healthcare provider right away if you experience any  of the following: ? Bleeding from an injury or your nose that does not stop. ? Unusual colored urine (red or dark brown) or unusual colored stools (red or black). ? Unusual bruising for unknown reasons. ? A serious fall or if you hit your head (even if there is no bleeding).  Some medicines may interact with Xarelto and might increase your risk of bleeding while on Xarelto. To help avoid this, consult your healthcare provider or pharmacist prior to using any new prescription or non-prescription medications, including herbals, vitamins, non-steroidal anti-inflammatory drugs (NSAIDs) and supplements.  This website has more information on Xarelto: [email or web address]

## 2016-12-14 DIAGNOSIS — Z85118 Personal history of other malignant neoplasm of bronchus and lung: Secondary | ICD-10-CM | POA: Diagnosis not present

## 2016-12-14 DIAGNOSIS — F419 Anxiety disorder, unspecified: Secondary | ICD-10-CM | POA: Diagnosis present

## 2016-12-14 DIAGNOSIS — T383X5A Adverse effect of insulin and oral hypoglycemic [antidiabetic] drugs, initial encounter: Secondary | ICD-10-CM | POA: Diagnosis present

## 2016-12-14 DIAGNOSIS — L270 Generalized skin eruption due to drugs and medicaments taken internally: Secondary | ICD-10-CM | POA: Diagnosis present

## 2016-12-14 DIAGNOSIS — I10 Essential (primary) hypertension: Secondary | ICD-10-CM | POA: Diagnosis present

## 2016-12-14 DIAGNOSIS — I482 Chronic atrial fibrillation: Secondary | ICD-10-CM | POA: Diagnosis present

## 2016-12-14 DIAGNOSIS — I2510 Atherosclerotic heart disease of native coronary artery without angina pectoris: Secondary | ICD-10-CM | POA: Diagnosis present

## 2016-12-14 DIAGNOSIS — R21 Rash and other nonspecific skin eruption: Secondary | ICD-10-CM | POA: Diagnosis present

## 2016-12-14 DIAGNOSIS — E039 Hypothyroidism, unspecified: Secondary | ICD-10-CM | POA: Diagnosis present

## 2016-12-14 DIAGNOSIS — Z8673 Personal history of transient ischemic attack (TIA), and cerebral infarction without residual deficits: Secondary | ICD-10-CM | POA: Diagnosis not present

## 2016-12-14 DIAGNOSIS — E119 Type 2 diabetes mellitus without complications: Secondary | ICD-10-CM | POA: Diagnosis present

## 2016-12-14 DIAGNOSIS — I350 Nonrheumatic aortic (valve) stenosis: Secondary | ICD-10-CM | POA: Diagnosis present

## 2016-12-14 LAB — GLUCOSE, CAPILLARY
GLUCOSE-CAPILLARY: 177 mg/dL — AB (ref 65–99)
Glucose-Capillary: 193 mg/dL — ABNORMAL HIGH (ref 65–99)
Glucose-Capillary: 183 mg/dL — ABNORMAL HIGH (ref 65–99)
Glucose-Capillary: 212 mg/dL — ABNORMAL HIGH (ref 65–99)

## 2016-12-14 LAB — HEMOGLOBIN A1C
HEMOGLOBIN A1C: 8 % — AB (ref 4.8–5.6)
MEAN PLASMA GLUCOSE: 183 mg/dL

## 2016-12-14 MED ORDER — LINAGLIPTIN 5 MG PO TABS
5.0000 mg | ORAL_TABLET | Freq: Every day | ORAL | Status: DC
  Administered 2016-12-14 – 2016-12-15 (×2): 5 mg via ORAL
  Filled 2016-12-14 (×2): qty 1

## 2016-12-14 MED ORDER — AMIODARONE HCL 200 MG PO TABS
200.0000 mg | ORAL_TABLET | Freq: Every day | ORAL | Status: DC
  Administered 2016-12-14 – 2016-12-17 (×4): 200 mg via ORAL
  Filled 2016-12-14 (×4): qty 1

## 2016-12-14 MED ORDER — DILTIAZEM HCL 60 MG PO TABS
60.0000 mg | ORAL_TABLET | Freq: Once | ORAL | Status: AC
  Administered 2016-12-14: 60 mg via ORAL
  Filled 2016-12-14: qty 1

## 2016-12-14 MED ORDER — ALPRAZOLAM 0.25 MG PO TABS
0.2500 mg | ORAL_TABLET | Freq: Every day | ORAL | Status: DC
  Administered 2016-12-14 – 2016-12-16 (×3): 0.25 mg via ORAL
  Filled 2016-12-14 (×3): qty 1

## 2016-12-14 MED ORDER — DILTIAZEM HCL ER COATED BEADS 240 MG PO CP24
240.0000 mg | ORAL_CAPSULE | Freq: Every day | ORAL | Status: DC
  Administered 2016-12-15: 240 mg via ORAL
  Filled 2016-12-14: qty 1

## 2016-12-14 NOTE — Progress Notes (Signed)
Ref: Dwan Bolt, MD   Subjective:  Heart rate back up to 120's. No chest pain. Improved relaxation.  Objective:  Vital Signs in the last 24 hours: Temp:  [97.4 F (36.3 C)-98.2 F (36.8 C)] 98.2 F (36.8 C) (12/17 1119) Pulse Rate:  [88-126] 126 (12/17 1022) Cardiac Rhythm: Atrial fibrillation (12/17 1211) BP: (98-126)/(63-86) 120/86 (12/17 1233) SpO2:  [94 %-100 %] 96 % (12/17 1233) Weight:  [59.5 kg (131 lb 1.6 oz)] 59.5 kg (131 lb 1.6 oz) (12/17 0435)  Physical Exam: BP Readings from Last 1 Encounters:  12/14/16 120/86    Wt Readings from Last 1 Encounters:  12/14/16 59.5 kg (131 lb 1.6 oz)    Weight change: 2.767 kg (6 lb 1.6 oz) Body mass index is 23.22 kg/m. HEENT: [place]/AT, Eyes-Blue-Green,  PERL, EOMI, Conjunctiva-Pink, Sclera-Non-icteric Neck: No JVD, No bruit, Trachea midline. Lungs:  Clear, Bilateral. Cardiac:  Irregular rhythm, normal S1 and S2, no S3. II/VI systolic murmur. Abdomen:  Soft, non-tender. BS present. Extremities:  No edema present. No cyanosis. No clubbing. CNS: AxOx3, Cranial nerves grossly intact, moves all 4 extremities.  Skin: Warm and dry.   Intake/Output from previous day: 12/16 0701 - 12/17 0700 In: 480 [P.O.:480] Out: 4 [Urine:2; Stool:2]    Lab Results: BMET    Component Value Date/Time   NA 136 12/13/2016 0316   NA 142 12/12/2016 1549   NA 142 12/12/2016 1547   K 3.7 12/13/2016 0316   K 2.9 (L) 12/12/2016 1549   K 2.9 (L) 12/12/2016 1547   CL 107 12/13/2016 0316   CL 105 12/12/2016 1549   CL 108 12/12/2016 1547   CO2 21 (L) 12/13/2016 0316   CO2 25 12/12/2016 1547   CO2 25 07/23/2016 0744   GLUCOSE 297 (H) 12/13/2016 0316   GLUCOSE 147 (H) 12/12/2016 1549   GLUCOSE 153 (H) 12/12/2016 1547   BUN 12 12/13/2016 0316   BUN 11 12/12/2016 1549   BUN 11 12/12/2016 1547   CREATININE 0.92 12/13/2016 0316   CREATININE 0.90 12/12/2016 1549   CREATININE 0.88 12/12/2016 1547   CALCIUM 8.3 (L) 12/13/2016 0316   CALCIUM  9.0 12/12/2016 1547   CALCIUM 9.4 07/23/2016 0744   GFRNONAA 57 (L) 12/13/2016 0316   GFRNONAA >60 12/12/2016 1547   GFRNONAA 58 (L) 07/23/2016 0744   GFRAA >60 12/13/2016 0316   GFRAA >60 12/12/2016 1547   GFRAA >60 07/23/2016 0744   CBC    Component Value Date/Time   WBC 4.5 12/13/2016 0316   RBC 3.94 12/13/2016 0316   HGB 11.9 (L) 12/13/2016 0316   HCT 34.1 (L) 12/13/2016 0316   PLT 98 (L) 12/13/2016 0316   MCV 86.5 12/13/2016 0316   MCH 30.2 12/13/2016 0316   MCHC 34.9 12/13/2016 0316   RDW 12.6 12/13/2016 0316   LYMPHSABS 2.3 12/12/2016 1547   MONOABS 0.7 12/12/2016 1547   EOSABS 0.2 12/12/2016 1547   BASOSABS 0.0 12/12/2016 1547   HEPATIC Function Panel  Recent Labs  04/07/16 1620 04/09/16 0952 12/12/16 1547  PROT 5.8* 5.8* 6.4*   HEMOGLOBIN A1C No components found for: HGA1C,  MPG CARDIAC ENZYMES Lab Results  Component Value Date   CKTOTAL 66 03/14/2012   CKMB 2.0 03/14/2012   TROPONINI 0.10 (H) 04/08/2016   TROPONINI 0.14 (H) 04/07/2016   TROPONINI <0.30 03/14/2012   BNP No results for input(s): PROBNP in the last 8760 hours. TSH  Recent Labs  04/07/16 1822 12/12/16 2241  TSH 0.031* 0.040*   CHOLESTEROL  No results for input(s): CHOL in the last 8760 hours.  Scheduled Meds: . ALPRAZolam  0.25 mg Oral BID  . amiodarone  200 mg Oral Daily  . atorvastatin  40 mg Oral q1800  . calcium-vitamin D  1 tablet Oral Q breakfast  . digoxin  0.125 mg Oral Daily  . [START ON 12/15/2016] diltiazem  240 mg Oral Daily  . diltiazem  60 mg Oral Once  . docusate sodium  100 mg Oral BID  . glimepiride  2 mg Oral Q breakfast  . insulin aspart  0-9 Units Subcutaneous TID WC  . isosorbide mononitrate  60 mg Oral Daily  . levothyroxine  100 mcg Oral QAC breakfast  . linagliptin  5 mg Oral Daily  . metFORMIN  500 mg Oral BID WC  . metoprolol  50 mg Oral BID  . pantoprazole  40 mg Oral Daily  . potassium chloride SA  20 mEq Oral TID  . Rivaroxaban  15 mg Oral  Q supper   Continuous Infusions: PRN Meds:.hydrocortisone cream, hydrOXYzine, meclizine, ondansetron **OR** ondansetron (ZOFRAN) IV  Assessment/Plan: Atrial fibrillation with RVR Hypertension DM, II Skin rash CAD Stroke Hypothyroidism Anxiety  Increase diltiazem dose. Add low dose amiodarone.   LOS: 0 days    Dixie Dials  MD  12/14/2016, 3:26 PM

## 2016-12-15 ENCOUNTER — Inpatient Hospital Stay (HOSPITAL_COMMUNITY): Payer: Medicare Other

## 2016-12-15 LAB — GLUCOSE, CAPILLARY
GLUCOSE-CAPILLARY: 151 mg/dL — AB (ref 65–99)
GLUCOSE-CAPILLARY: 171 mg/dL — AB (ref 65–99)
Glucose-Capillary: 133 mg/dL — ABNORMAL HIGH (ref 65–99)
Glucose-Capillary: 148 mg/dL — ABNORMAL HIGH (ref 65–99)

## 2016-12-15 LAB — ECHOCARDIOGRAM COMPLETE
Height: 63 in
WEIGHTICAEL: 2088 [oz_av]

## 2016-12-15 MED ORDER — DILTIAZEM HCL ER COATED BEADS 180 MG PO CP24
300.0000 mg | ORAL_CAPSULE | Freq: Every day | ORAL | Status: DC
  Administered 2016-12-16 – 2016-12-17 (×2): 300 mg via ORAL
  Filled 2016-12-15 (×2): qty 1

## 2016-12-15 MED ORDER — DILTIAZEM HCL 60 MG PO TABS
60.0000 mg | ORAL_TABLET | Freq: Once | ORAL | Status: AC
  Administered 2016-12-15: 60 mg via ORAL
  Filled 2016-12-15: qty 1

## 2016-12-15 MED ORDER — METOPROLOL TARTRATE 50 MG PO TABS
50.0000 mg | ORAL_TABLET | Freq: Three times a day (TID) | ORAL | Status: DC
  Administered 2016-12-15: 50 mg via ORAL
  Filled 2016-12-15: qty 1

## 2016-12-15 NOTE — Plan of Care (Signed)
Problem: Safety: Goal: Ability to remain free from injury will improve Outcome: Progressing Pt has remained free from injury. Fall risk bundle in place, bed alarm on, non-skid foot wear on, call light within reach. Pt has no other needs or concerns at this time. Will continue to monitor and perform hourly rounding.   Problem: Skin Integrity: Goal: Risk for impaired skin integrity will decrease Outcome: Progressing Pt complained of itching at the beginning of shift on the lower extremities. Pt given hydrocortisone cream topically to involved areas, per order. Pt stated the cream relieved the itching. Pt has no other needs at this time. Will continue to follow up with pt and perform hourly rounding.

## 2016-12-15 NOTE — Progress Notes (Signed)
Ref: Deanna Bolt, MD   Subjective:  Heart rate in 100 to110/min. Feels better. No chest pain. Echocardiogram shows moderate AS, mild LVH and normal systolic function with moderately dilated LA and RA.  Objective:  Vital Signs in the last 24 hours: Temp:  [97.3 F (36.3 C)-97.8 F (36.6 C)] 97.8 F (36.6 C) (12/18 1337) Pulse Rate:  [95-107] 107 (12/18 1337) Cardiac Rhythm: Atrial fibrillation (12/18 0730) Resp:  [16-99] 16 (12/18 1337) BP: (98-124)/(57-76) 124/76 (12/18 1221) SpO2:  [95 %-99 %] 99 % (12/18 1337) Weight:  [59.2 kg (130 lb 8 oz)] 59.2 kg (130 lb 8 oz) (12/18 0554)  Physical Exam: BP Readings from Last 1 Encounters:  12/15/16 124/76    Wt Readings from Last 1 Encounters:  12/15/16 59.2 kg (130 lb 8 oz)    Weight change: -0.272 kg (-9.6 oz) Body mass index is 23.12 kg/m. HEENT: [place]/AT, Eyes-Blue Green, PERL, EOMI, Conjunctiva-Pink, Sclera-Non-icteric Neck: No JVD, No bruit, Trachea midline. Lungs:  Clear, Bilateral. Cardiac:  Irregular rhythm, normal S1 and S2, no S3. II/VI systolic and diastolic murmur. Abdomen:  Soft, non-tender. BS present. Extremities:  No edema present. No cyanosis. No clubbing. CNS: AxOx3, Cranial nerves grossly intact, moves all 4 extremities.  Skin: Warm and dry.   Intake/Output from previous day: 12/17 0701 - 12/18 0700 In: 582 [P.O.:582] Out: 1800 [Urine:1800]    Lab Results: BMET    Component Value Date/Time   NA 136 12/13/2016 0316   NA 142 12/12/2016 1549   NA 142 12/12/2016 1547   K 3.7 12/13/2016 0316   K 2.9 (L) 12/12/2016 1549   K 2.9 (L) 12/12/2016 1547   CL 107 12/13/2016 0316   CL 105 12/12/2016 1549   CL 108 12/12/2016 1547   CO2 21 (L) 12/13/2016 0316   CO2 25 12/12/2016 1547   CO2 25 07/23/2016 0744   GLUCOSE 297 (H) 12/13/2016 0316   GLUCOSE 147 (H) 12/12/2016 1549   GLUCOSE 153 (H) 12/12/2016 1547   BUN 12 12/13/2016 0316   BUN 11 12/12/2016 1549   BUN 11 12/12/2016 1547   CREATININE 0.92  12/13/2016 0316   CREATININE 0.90 12/12/2016 1549   CREATININE 0.88 12/12/2016 1547   CALCIUM 8.3 (L) 12/13/2016 0316   CALCIUM 9.0 12/12/2016 1547   CALCIUM 9.4 07/23/2016 0744   GFRNONAA 57 (L) 12/13/2016 0316   GFRNONAA >60 12/12/2016 1547   GFRNONAA 58 (L) 07/23/2016 0744   GFRAA >60 12/13/2016 0316   GFRAA >60 12/12/2016 1547   GFRAA >60 07/23/2016 0744   CBC    Component Value Date/Time   WBC 4.5 12/13/2016 0316   RBC 3.94 12/13/2016 0316   HGB 11.9 (L) 12/13/2016 0316   HCT 34.1 (L) 12/13/2016 0316   PLT 98 (L) 12/13/2016 0316   MCV 86.5 12/13/2016 0316   MCH 30.2 12/13/2016 0316   MCHC 34.9 12/13/2016 0316   RDW 12.6 12/13/2016 0316   LYMPHSABS 2.3 12/12/2016 1547   MONOABS 0.7 12/12/2016 1547   EOSABS 0.2 12/12/2016 1547   BASOSABS 0.0 12/12/2016 1547   HEPATIC Function Panel  Recent Labs  04/07/16 1620 04/09/16 0952 12/12/16 1547  PROT 5.8* 5.8* 6.4*   HEMOGLOBIN A1C No components found for: HGA1C,  MPG CARDIAC ENZYMES Lab Results  Component Value Date   CKTOTAL 66 03/14/2012   CKMB 2.0 03/14/2012   TROPONINI 0.10 (H) 04/08/2016   TROPONINI 0.14 (H) 04/07/2016   TROPONINI <0.30 03/14/2012   BNP No results for input(s): PROBNP in  the last 8760 hours. TSH  Recent Labs  04/07/16 1822 12/12/16 2241  TSH 0.031* 0.040*   CHOLESTEROL No results for input(s): CHOL in the last 8760 hours.  Scheduled Meds: . ALPRAZolam  0.25 mg Oral QHS  . amiodarone  200 mg Oral Daily  . atorvastatin  40 mg Oral q1800  . calcium-vitamin D  1 tablet Oral Q breakfast  . digoxin  0.125 mg Oral Daily  . [START ON 12/16/2016] diltiazem  300 mg Oral Daily  . docusate sodium  100 mg Oral BID  . glimepiride  2 mg Oral Q breakfast  . insulin aspart  0-9 Units Subcutaneous TID WC  . isosorbide mononitrate  60 mg Oral Daily  . levothyroxine  100 mcg Oral QAC breakfast  . linagliptin  5 mg Oral Daily  . metFORMIN  500 mg Oral BID WC  . metoprolol  50 mg Oral TID  .  pantoprazole  40 mg Oral Daily  . potassium chloride SA  20 mEq Oral TID  . Rivaroxaban  15 mg Oral Q supper   Continuous Infusions: PRN Meds:.hydrocortisone cream, hydrOXYzine, meclizine, ondansetron **OR** ondansetron (ZOFRAN) IV  Assessment/Plan: Atrial fibrillation(Chronic) with RVR-CHA2DS2VASc score of 7/9 Hypertension DM, II Skin rash CAD Stroke Hypothyroidism Anxiety Moderate aortic stenosis  Increase B-blocker dose. Increase activity.   LOS: 1 day    Dixie Dials  MD  12/15/2016, 6:57 PM

## 2016-12-15 NOTE — Progress Notes (Signed)
  Echocardiogram 2D Echocardiogram has been performed.  Tresa Res 12/15/2016, 4:51 PM

## 2016-12-16 LAB — BASIC METABOLIC PANEL
Anion gap: 11 (ref 5–15)
BUN: 19 mg/dL (ref 6–20)
CHLORIDE: 105 mmol/L (ref 101–111)
CO2: 20 mmol/L — ABNORMAL LOW (ref 22–32)
CREATININE: 1.1 mg/dL — AB (ref 0.44–1.00)
Calcium: 9.2 mg/dL (ref 8.9–10.3)
GFR calc Af Amer: 53 mL/min — ABNORMAL LOW (ref >60)
GFR calc non Af Amer: 46 mL/min — ABNORMAL LOW (ref >60)
GLUCOSE: 209 mg/dL — AB (ref 65–99)
POTASSIUM: 5.5 mmol/L — AB (ref 3.5–5.1)
Sodium: 136 mmol/L (ref 135–145)

## 2016-12-16 LAB — LIPID PANEL
CHOL/HDL RATIO: 3.8 ratio
Cholesterol: 120 mg/dL (ref 0–200)
HDL: 32 mg/dL — AB (ref >40)
LDL CALC: 62 mg/dL (ref 0–99)
Triglycerides: 130 mg/dL (ref <150)
VLDL: 26 mg/dL (ref 0–40)

## 2016-12-16 LAB — GLUCOSE, CAPILLARY
GLUCOSE-CAPILLARY: 181 mg/dL — AB (ref 65–99)
Glucose-Capillary: 173 mg/dL — ABNORMAL HIGH (ref 65–99)
Glucose-Capillary: 103 mg/dL — ABNORMAL HIGH (ref 65–99)
Glucose-Capillary: 136 mg/dL — ABNORMAL HIGH (ref 65–99)

## 2016-12-16 MED ORDER — HYDROXYZINE HCL 25 MG PO TABS
25.0000 mg | ORAL_TABLET | Freq: Three times a day (TID) | ORAL | Status: DC
  Administered 2016-12-16 – 2016-12-17 (×4): 25 mg via ORAL
  Filled 2016-12-16 (×4): qty 1

## 2016-12-16 MED ORDER — METOPROLOL TARTRATE 50 MG PO TABS
50.0000 mg | ORAL_TABLET | Freq: Two times a day (BID) | ORAL | Status: DC
  Administered 2016-12-16 – 2016-12-17 (×3): 50 mg via ORAL
  Filled 2016-12-16 (×3): qty 1

## 2016-12-16 MED ORDER — INSULIN DETEMIR 100 UNIT/ML ~~LOC~~ SOLN
10.0000 [IU] | Freq: Two times a day (BID) | SUBCUTANEOUS | Status: DC
  Administered 2016-12-16 – 2016-12-17 (×3): 10 [IU] via SUBCUTANEOUS
  Filled 2016-12-16 (×3): qty 0.1

## 2016-12-16 NOTE — Progress Notes (Signed)
Ref: Dwan Bolt, MD   Subjective:  Feeling better except itching all over. Had stopped metformin in past 4 months on her own. Used to take insulin shots before that with h/o weight gain.  Objective:  Vital Signs in the last 24 hours: Temp:  [97.5 F (36.4 C)-98.6 F (37 C)] 98.6 F (37 C) (12/19 0721) Pulse Rate:  [63-107] 67 (12/19 0721) Cardiac Rhythm: Atrial fibrillation (12/19 0700) Resp:  [16-18] 18 (12/19 0721) BP: (88-124)/(50-76) 88/50 (12/19 0721) SpO2:  [97 %-99 %] 97 % (12/19 0721) Weight:  [58.9 kg (129 lb 12.8 oz)] 58.9 kg (129 lb 12.8 oz) (12/19 0500)  Physical Exam: BP Readings from Last 1 Encounters:  12/16/16 (!) 88/50    Wt Readings from Last 1 Encounters:  12/16/16 58.9 kg (129 lb 12.8 oz)    Weight change: -0.318 kg (-11.2 oz) Body mass index is 22.99 kg/m. HEENT: [place]/AT, Eyes-Blue-Green, PERL, EOMI, Conjunctiva-Pink, Sclera-Non-icteric Neck: No JVD, No bruit, Trachea midline. Lungs:  Clear, Bilateral. Cardiac:  Irregular rhythm, normal S1 and S2, no S3. II/VI systolic murmur. Abdomen:  Soft, non-tender. BS present. Extremities:  No edema present. No cyanosis. No clubbing. CNS: AxOx3, Cranial nerves grossly intact, moves all 4 extremities.  Skin: Warm and dry. Multiple excoriations on legs and back.   Intake/Output from previous day: 12/18 0701 - 12/19 0700 In: 845 [P.O.:845] Out: 2350 [Urine:2350]    Lab Results: BMET    Component Value Date/Time   NA 136 12/16/2016 0312   NA 136 12/13/2016 0316   NA 142 12/12/2016 1549   K 5.5 (H) 12/16/2016 0312   K 3.7 12/13/2016 0316   K 2.9 (L) 12/12/2016 1549   CL 105 12/16/2016 0312   CL 107 12/13/2016 0316   CL 105 12/12/2016 1549   CO2 20 (L) 12/16/2016 0312   CO2 21 (L) 12/13/2016 0316   CO2 25 12/12/2016 1547   GLUCOSE 209 (H) 12/16/2016 0312   GLUCOSE 297 (H) 12/13/2016 0316   GLUCOSE 147 (H) 12/12/2016 1549   BUN 19 12/16/2016 0312   BUN 12 12/13/2016 0316   BUN 11 12/12/2016  1549   CREATININE 1.10 (H) 12/16/2016 0312   CREATININE 0.92 12/13/2016 0316   CREATININE 0.90 12/12/2016 1549   CALCIUM 9.2 12/16/2016 0312   CALCIUM 8.3 (L) 12/13/2016 0316   CALCIUM 9.0 12/12/2016 1547   GFRNONAA 46 (L) 12/16/2016 0312   GFRNONAA 57 (L) 12/13/2016 0316   GFRNONAA >60 12/12/2016 1547   GFRAA 53 (L) 12/16/2016 0312   GFRAA >60 12/13/2016 0316   GFRAA >60 12/12/2016 1547   CBC    Component Value Date/Time   WBC 4.5 12/13/2016 0316   RBC 3.94 12/13/2016 0316   HGB 11.9 (L) 12/13/2016 0316   HCT 34.1 (L) 12/13/2016 0316   PLT 98 (L) 12/13/2016 0316   MCV 86.5 12/13/2016 0316   MCH 30.2 12/13/2016 0316   MCHC 34.9 12/13/2016 0316   RDW 12.6 12/13/2016 0316   LYMPHSABS 2.3 12/12/2016 1547   MONOABS 0.7 12/12/2016 1547   EOSABS 0.2 12/12/2016 1547   BASOSABS 0.0 12/12/2016 1547   HEPATIC Function Panel  Recent Labs  04/07/16 1620 04/09/16 0952 12/12/16 1547  PROT 5.8* 5.8* 6.4*   HEMOGLOBIN A1C No components found for: HGA1C,  MPG CARDIAC ENZYMES Lab Results  Component Value Date   CKTOTAL 66 03/14/2012   CKMB 2.0 03/14/2012   TROPONINI 0.10 (H) 04/08/2016   TROPONINI 0.14 (H) 04/07/2016   TROPONINI <0.30 03/14/2012  BNP No results for input(s): PROBNP in the last 8760 hours. TSH  Recent Labs  04/07/16 1822 12/12/16 2241  TSH 0.031* 0.040*   CHOLESTEROL No results for input(s): CHOL in the last 8760 hours.  Scheduled Meds: . ALPRAZolam  0.25 mg Oral QHS  . amiodarone  200 mg Oral Daily  . atorvastatin  40 mg Oral q1800  . calcium-vitamin D  1 tablet Oral Q breakfast  . digoxin  0.125 mg Oral Daily  . diltiazem  300 mg Oral Daily  . docusate sodium  100 mg Oral BID  . hydrOXYzine  25 mg Oral TID  . insulin aspart  0-9 Units Subcutaneous TID WC  . insulin detemir  10 Units Subcutaneous BID  . levothyroxine  100 mcg Oral QAC breakfast  . metoprolol  50 mg Oral BID  . pantoprazole  40 mg Oral Daily  . Rivaroxaban  15 mg Oral Q  supper   Continuous Infusions: PRN Meds:.hydrocortisone cream, ondansetron **OR** ondansetron (ZOFRAN) IV  Assessment/Plan: Atrial fibrillation with RVR-improved Hypertension DM, II-uncontrolled Skin rash and excoriations CAD Stroke Hypothyroidism Anxiety Moderate aortic stenosis  Add Atarax. Discontinue oral hypoglycemic agents-possibly causing itching. Discontinue Isosorbide due to low blood pressure. Decrease Metoprolol to bid dosing. Levemir 10 units BID. Increase activity.   LOS: 2 days    Dixie Dials  MD  12/16/2016, 9:28 AM

## 2016-12-16 NOTE — Evaluation (Signed)
Physical Therapy Evaluation Patient Details Name: LEASIA SWANN MRN: [id number] DOB: 04/09/1934 Today's Date: 12/16/2016   History of Present Illness  Patient is a 82 y/o female with hx of lung ca, CVA, DM, CAD, HTN, A-fib presents with rash over back and legs with itching. She was found to be in atrial fibrillation with rapid ventricular response.  Clinical Impression  Patient presents with generalized weakness, deconditioning, impaired balance and impaired mobility s/p above. Tolerated gait training with Min A for balance/safety. HR stable in A-fib. Pt with bil knee instability but no overt buckling. Pt Mod I PTA, cooks, cleans but does not have help at home from spouse. Pt also has to negotiate 8 steps to get into home. May need some initial assist at home from son until strength improves. Will follow acutely to maximize independence and mobility prior to return home.     Follow Up Recommendations Home health PT;Supervision for mobility/OOB;Supervision/Assistance - 24 hour    Equipment Recommendations  None recommended by PT    Recommendations for Other Services OT consult     Precautions / Restrictions Precautions Precautions: Fall Precaution Comments: A-fib Restrictions Weight Bearing Restrictions: No      Mobility  Bed Mobility Overal bed mobility: Needs Assistance Bed Mobility: Supine to Sit     Supine to sit: Min guard;HOB elevated     General bed mobility comments: No assist needed. Use of rail for support.   Transfers Overall transfer level: Needs assistance Equipment used: Rolling walker (2 wheeled) Transfers: Sit to/from Stand Sit to Stand: Min guard         General transfer comment: Multiple attempts to stand pulling up on RW despite cues for hand placement/technique. Stood from [employer], from toilet x1. Transferred to chair post ambulation.   Ambulation/Gait Ambulation/Gait assistance: Min assist Ambulation Distance (Feet): 120 Feet Assistive  device: Rolling walker (2 wheeled) Gait Pattern/deviations: Step-through pattern;Decreased stride length;Trunk flexed Gait velocity: decreased   General Gait Details: Slow, unsteady gait with instability noted Bil knees. Cues for RW proximity. HR stable in A-fib 80-90s  Stairs            Wheelchair Mobility    Modified Rankin (Stroke Patients Only)       Balance Overall balance assessment: Needs assistance Sitting-balance support: Feet supported;No upper extremity supported Sitting balance-Leahy Scale: Fair Sitting balance - Comments: Able to perform pericare without assist.    Standing balance support: During functional activity Standing balance-Leahy Scale: Fair Standing balance comment: Requires at least 1 UE support during dynamic standing and walking, Able to stand at sink and wash hands without LOB.                             Pertinent Vitals/Pain Pain Assessment: No/denies pain    Home Living Family/patient expects to be discharged to:: Private residence Living Arrangements: Spouse/significant other Available Help at Discharge: Family;Available PRN/intermittently Type of Home: House Home Access: Stairs to enter Entrance Stairs-Rails: Left;Right;Can reach both Entrance Stairs-Number of Steps: 8 Home Layout: One level Home Equipment: Cane - single point;Shower seat;Bedside commode      Prior Function Level of Independence: Independent         Comments: she uses a SPC and RW as needed. Furniture walks. Drives. Cooks, cleans.      Hand Dominance        Extremity/Trunk Assessment   Upper Extremity Assessment Upper Extremity Assessment: Defer to OT evaluation  Lower Extremity Assessment Lower Extremity Assessment: Generalized weakness    Cervical / Trunk Assessment Cervical / Trunk Assessment: Kyphotic  Communication   Communication: No difficulties  Cognition Arousal/Alertness: Awake/alert Behavior During Therapy: WFL for  tasks assessed/performed Overall Cognitive Status: Within Functional Limits for tasks assessed                      General Comments      Exercises     Assessment/Plan    PT Assessment Patient needs continued PT services  PT Problem List Decreased strength;Decreased mobility;Decreased balance;Decreased activity tolerance;Decreased knowledge of use of DME          PT Treatment Interventions DME instruction;Therapeutic activities;Gait training;Therapeutic exercise;Patient/family education;Balance training;Stair training;Functional mobility training    PT Goals (Current goals can be found in the Care Plan section)  Acute Rehab PT Goals Patient Stated Goal: to go home PT Goal Formulation: With patient Time For Goal Achievement: 12/30/16 Potential to Achieve Goals: Good    Frequency Min 3X/week   Barriers to discharge Decreased caregiver support;Inaccessible home environment stairs to enter home and spouse not able to assist    Co-evaluation               End of Session Equipment Utilized During Treatment: Gait belt Activity Tolerance: Patient tolerated treatment well Patient left: in chair;with call bell/phone within reach Nurse Communication: Mobility status         Time: 8485-9276 PT Time Calculation (min) (ACUTE ONLY): 25 min   Charges:   PT Evaluation $PT Eval Low Complexity: 1 Procedure PT Treatments $Gait Training: 8-22 mins   PT G Codes:        Fianna Snowball A Hadden Steig 12/16/2016, 2:27 PM Wray Kearns, [place], DPT [phone]

## 2016-12-17 LAB — BASIC METABOLIC PANEL
Anion gap: 8 (ref 5–15)
BUN: 23 mg/dL — AB (ref 6–20)
CHLORIDE: 104 mmol/L (ref 101–111)
CO2: 24 mmol/L (ref 22–32)
Calcium: 9.2 mg/dL (ref 8.9–10.3)
Creatinine, Ser: 1.29 mg/dL — ABNORMAL HIGH (ref 0.44–1.00)
GFR calc Af Amer: 44 mL/min — ABNORMAL LOW (ref >60)
GFR calc non Af Amer: 38 mL/min — ABNORMAL LOW (ref >60)
Glucose, Bld: 205 mg/dL — ABNORMAL HIGH (ref 65–99)
Potassium: 5.2 mmol/L — ABNORMAL HIGH (ref 3.5–5.1)
SODIUM: 136 mmol/L (ref 135–145)

## 2016-12-17 LAB — GLUCOSE, CAPILLARY
Glucose-Capillary: 126 mg/dL — ABNORMAL HIGH (ref 65–99)
Glucose-Capillary: 178 mg/dL — ABNORMAL HIGH (ref 65–99)

## 2016-12-17 MED ORDER — LEVOTHYROXINE SODIUM 100 MCG PO TABS: 100.0000 ug | ORAL_TABLET | Freq: Every day | ORAL | 3 refills | Status: DC

## 2016-12-17 MED ORDER — AMIODARONE HCL 200 MG PO TABS: 200.0000 mg | ORAL_TABLET | Freq: Every day | ORAL | 1 refills | Status: DC

## 2016-12-17 MED ORDER — ALPRAZOLAM 0.25 MG PO TABS: 0.2500 mg | ORAL_TABLET | Freq: Every day | ORAL | 3 refills | Status: DC

## 2016-12-17 MED ORDER — PANTOPRAZOLE SODIUM 40 MG PO TBEC: 40.0000 mg | DELAYED_RELEASE_TABLET | Freq: Every day | ORAL | 3 refills | Status: DC

## 2016-12-17 MED ORDER — DILTIAZEM HCL ER COATED BEADS 240 MG PO CP24: 240.0000 mg | ORAL_CAPSULE | Freq: Every day | ORAL | 3 refills | Status: DC

## 2016-12-17 MED ORDER — INSULIN DETEMIR 100 UNIT/ML FLEXPEN: 20.0000 [IU] | PEN_INJECTOR | Freq: Every day | SUBCUTANEOUS | 11 refills | Status: DC

## 2016-12-17 MED ORDER — HYDROXYZINE HCL 25 MG PO TABS: 25.0000 mg | ORAL_TABLET | Freq: Three times a day (TID) | ORAL | 3 refills | Status: DC | PRN

## 2016-12-17 NOTE — Progress Notes (Signed)
Received diabetes coordinator consult. Spoke with patient about taking insulin at home. Staff RN already had talked with patient about giving insulin and patient was able to give an injection without problem.  Patient states that she has been on insulin about 5 years, but it had caused her to gain up to 40 pounds. Patient given information on treating hypoglycemia and hyperglycemia, encouraged patient to check blood sugars at home. States that she will need a new meter and strips for home. Concern that patient may have trouble with vision in drawing up insulin in syringe. Staff RN to check with case manager about what insurance will cover for insulin and if insulin pens could be used. Patient to see Dr. Wilson Singer in the next few weeks. Harvel Ricks RN BSN CDE

## 2016-12-17 NOTE — Progress Notes (Signed)
Patient going home on insulin instead of her oral diabetic medication, with AM meds did insulin teaching and demonstrated giving the injection, she was able to do it her self and stated " I was on insulin before" . Will continue to monitor.

## 2016-12-17 NOTE — Discharge Summary (Signed)
Physician Discharge Summary  Patient ID: [name] MRN: [id number] DOB/AGE: 05-Jun-1934 82 y.o.  Admit date: 12/12/2016 Discharge date: 12/17/2016  Admission Diagnoses: Atrial fibrillation with RVR Hypertension DM, II-uncontrolled CAD Stroke Hypothyroidism  Discharge Diagnoses:  Principle Problem: * Atrial fibrillation with rapid ventricular response (HCC) * - CHA2DSVASc score of 7/9 Active Problems:   Hypertension   DM, II   Skin rash-possible Metformin adverse effect   CAD   S/P stroke   Hypothyroidism   Anxiety   Moderate aortic stenosis   H/O benign multiple bilateral pulmonary nodules  Discharged Condition: fair  Hospital Course: 82 year old female presented with atrial fibrillation with RVR and skin rash over leg and back. She also has CAD, DM, II-uncontrolled, hypertension, hypothyroidism, lung cancer and h/o stroke. She responded to increase in diltiazem dose and adding amiodarone for heart rate control. She also felt better with addition of alprazolam for anxiety, Atarax for itching, decreasing levothyroxin dose for very low TSH and starting insulin for blood sugar control. Metformin and Glimepiride were discontinued to see if her rash and itching disappears. She will see primary care doctor in 1 week and Dr. Einar Gip in 3 months.   Consults: cardiology  Significant Diagnostic Studies: labs: Near normal CBC, Elevated A1C of 8.0. Normal lipid panel except low HDL of 32 mg/dL. Very low TSH of 0.040. BNP - 207.9, glucose 153 mg and Potassium of 2.9 meq.  Echocardiogram: Mild LVH with normal LV systolic function and mild diastolioc dysfunction. Moderate Aortic Stenosis.  Treatments: cardiac meds: metoprolol, diltiazem, amiodarone and Xarelto.  Discharge Exam: Blood pressure 108/64, pulse 76, temperature 97.5 F (36.4 C), temperature source Axillary, resp. rate 18, height '5\' 3"'$  (1.6 m), weight 58.5 kg (128 lb 14.4 oz), SpO2 96 %. General appearance: alert,  cooperative and appears stated age Head: Normocephalic, atraumatic. Eyes: Blue-Green eyes, pink conjunctivae/corneas clear. PERRL, EOM's intact.  Neck: No adenopathy, no carotid bruit, no JVD, supple, symmetrical, trachea midline and thyroid not enlarged. Resp: Clear to auscultation bilaterally. Cardio: regular rate and rhythm, S1, S2 normal, II/VI systolic murmur, no click, rub or gallop. GI: soft, non-tender; bowel sounds normal; no masses,  no organomegaly Extremities: No cyanosis or edema Skin: Warm and dry. Multiple excoriations over legs and back. Mild erythematous rash over legs, upper arms and back. Neurologic: Alert and oriented X 3, normal strength and tone. Normal coordination and slow gait.  Disposition: 01-Home or Self Care   Allergies as of 12/17/2016      Reactions   Codeine Nausea And Vomiting   Hydrocodone-acetaminophen Nausea And Vomiting      Medication List    STOP taking these medications   atorvastatin 40 MG tablet Commonly known as:  LIPITOR   fluticasone 0.05 % cream Commonly known as:  CUTIVATE   glimepiride 2 MG tablet Commonly known as:  AMARYL   isosorbide mononitrate 60 MG 24 hr tablet Commonly known as:  IMDUR   meclizine 25 MG tablet Commonly known as:  ANTIVERT   metFORMIN 500 MG tablet Commonly known as:  GLUCOPHAGE   omeprazole 20 MG capsule Commonly known as:  PRILOSEC Replaced by:  pantoprazole 40 MG tablet   permethrin 5 % cream Commonly known as:  ACTICIN   triamcinolone 0.025 % ointment Commonly known as:  KENALOG     TAKE these medications   acetaminophen 500 MG tablet Commonly known as:  TYLENOL Take 500 mg by mouth every 6 (six) hours as needed for moderate pain or headache.  ALPRAZolam 0.25 MG tablet Commonly known as:  XANAX Take 1 tablet (0.25 mg total) by mouth at bedtime.   amiodarone 200 MG tablet Commonly known as:  PACERONE Take 1 tablet (200 mg total) by mouth daily. Start taking on:  12/18/2016    CALCIUM 600-D PO Take 600 mg by mouth daily.   clobetasol cream 0.05 % Commonly known as:  TEMOVATE Apply 1 application topically 2 (two) times daily as needed (itching/ do not use on face). What changed:  Another medication with the same name was removed. Continue taking this medication, and follow the directions you see here.   colesevelam 625 MG tablet Commonly known as:  WELCHOL Take 312.5-625 mg by mouth daily as needed (diarrhea).   diltiazem 240 MG 24 hr capsule Commonly known as:  CARDIZEM CD Take 1 capsule (240 mg total) by mouth daily. Start taking on:  12/18/2016 What changed:  medication strength  how much to take  Another medication with the same name was removed. Continue taking this medication, and follow the directions you see here.   hydrOXYzine 25 MG tablet Commonly known as:  ATARAX/VISTARIL Take 1 tablet (25 mg total) by mouth every 8 (eight) hours as needed for itching.   Insulin Detemir 100 UNIT/ML Pen Commonly known as:  LEVEMIR Inject 20 Units into the skin daily at 10 pm.   levothyroxine 100 MCG tablet Commonly known as:  SYNTHROID, LEVOTHROID Take 1 tablet (100 mcg total) by mouth daily before breakfast. What changed:  medication strength  how much to take   loratadine 10 MG tablet Commonly known as:  CLARITIN Take 10 mg by mouth daily.   metoprolol 50 MG tablet Commonly known as:  LOPRESSOR Take 50 mg by mouth 2 (two) times daily.   mupirocin ointment 2 % Commonly known as:  BACTROBAN Apply 1 application topically daily as needed (itching).   pantoprazole 40 MG tablet Commonly known as:  PROTONIX Take 1 tablet (40 mg total) by mouth daily. Start taking on:  12/18/2016 Replaces:  omeprazole 20 MG capsule   Rivaroxaban 15 MG Tabs tablet Commonly known as:  XARELTO Take 15 mg by mouth daily with supper.   SARNA SENSITIVE 1 % Lotn Generic drug:  pramoxine Apply 1 application topically 3 (three) times daily as needed  (itching).      Follow-up Information    Dwan Bolt, MD Follow up.   Specialty:  Endocrinology Why:  As needed Contact information: [address] [place] [address] [phone]        Adrian Prows, MD Follow up in 4 month(s).   Specialty:  Cardiology Why:  Has appointment Contact information: [address] [place] [address] [phone]           Signed: Birdie Riddle 12/17/2016, 10:11 AM

## 2016-12-17 NOTE — Care Management Note (Signed)
Case Management Note  Patient Details  Name: [name] MRN: [id number] Date of Birth: September 25, 1934  Subjective/Objective:    Pt presented for Atrial Fib with RVR. Pt is from home with husband. Plan will be for dc today home with [facility]. Pt has used [facility] in the past and wanted to use again. Agency List was provided and pt wanted to go with [facility].               Action/Plan: CM did make referral with Liaison Angela with [facility]. SOC to begin within 24-48 hours post d/c. No further needs from CM at this time.   Expected Discharge Date:                  Expected Discharge Plan:  [place]  In-House Referral:  NA  Discharge planning Services  CM Consult  Post Acute Care Choice:  Home Health Choice offered to:  Patient  DME Arranged:  N/A DME Agency:  NA  HH Arranged:  RN, PT [place] Agency:  [facility]  Status of Service:  Completed, signed off  If discussed at [place] of Stay Meetings, dates discussed:    Additional Comments:  Bethena Roys, RN 12/17/2016, 11:54 AM

## 2016-12-17 NOTE — Evaluation (Signed)
Occupational Therapy Evaluation Patient Details Name: [name] MRN: [id number] DOB: 05-Mar-1934 Today's Date: 12/17/2016    History of Present Illness Patient is a 82 y/o female with hx of lung ca, CVA, DM, CAD, HTN, A-fib presents with rash over back and legs with itching. She was found to be in atrial fibrillation with rapid ventricular response.   Clinical Impression   Pt reports she was independent with ADL PTA. Currently pt overall min guard for ADL and functional mobility. Pt with increased SOB noted during activity; SpO2=96% on RA (RN notified). Pt planning to d/c home with supervision from her husband. Pt would benefit from continued skilled OT to address established goals.    Follow Up Recommendations  No OT follow up;Supervision/Assistance - 24 hour    Equipment Recommendations  None recommended by OT    Recommendations for Other Services       Precautions / Restrictions Precautions Precautions: Fall Precaution Comments: A-fib Restrictions Weight Bearing Restrictions: No      Mobility Bed Mobility Overal bed mobility: Modified Independent Bed Mobility: Supine to Sit;Sit to Supine           General bed mobility comments: HOB slightly elevated with use of bed rails. Increased time required but no physical assist  Transfers Overall transfer level: Needs assistance Equipment used: Rolling walker (2 wheeled) Transfers: Sit to/from Stand Sit to Stand: Min guard         General transfer comment: Cues for hand placement. Sit to stand from EOB x 1, toilet x1.    Balance Overall balance assessment: Needs assistance Sitting-balance support: Feet supported;No upper extremity supported Sitting balance-Leahy Scale: Good     Standing balance support: No upper extremity supported;During functional activity Standing balance-Leahy Scale: Fair Standing balance comment: Able to stand at sink and complete grooming and LB bathing.                             ADL Overall ADL's : Needs assistance/impaired Eating/Feeding: Set up;Sitting   Grooming: Min guard;Standing;Wash/dry hands;Wash/dry face   Upper Body Bathing: Set up;Supervision/ safety;Sitting   Lower Body Bathing: Min guard;Sit to/from stand   Upper Body Dressing : Set up;Supervision/safety;Sitting   Lower Body Dressing: Min guard;Sit to/from stand Lower Body Dressing Details (indicate cue type and reason): Min guard for cleaning bottom standing at the sink Toilet Transfer: Min guard;Ambulation;Regular Toilet;RW   Toileting- Clothing Manipulation and Hygiene: Supervision/safety;Sitting/lateral lean Toileting - Clothing Manipulation Details (indicate cue type and reason): for peri care only     Functional mobility during ADLs: Min guard;Rolling walker General ADL Comments: HR stable throughout. Pt with increased SOB during activity but SpO2=96%; RN notified.      Vision Vision Assessment?: No apparent visual deficits   Perception     Praxis      Pertinent Vitals/Pain Pain Assessment: No/denies pain     Hand Dominance     Extremity/Trunk Assessment Upper Extremity Assessment Upper Extremity Assessment: Overall WFL for tasks assessed   Lower Extremity Assessment Lower Extremity Assessment: Defer to PT evaluation   Cervical / Trunk Assessment Cervical / Trunk Assessment: Kyphotic   Communication Communication Communication: No difficulties   Cognition Arousal/Alertness: Awake/alert Behavior During Therapy: WFL for tasks assessed/performed Overall Cognitive Status: Within Functional Limits for tasks assessed                     General Comments       Exercises  Shoulder Instructions      Home Living Family/patient expects to be discharged to:: Private residence Living Arrangements: Spouse/significant other Available Help at Discharge: Family;Available PRN/intermittently Type of Home: House Home Access: Stairs to  enter [employer] of Steps: 8 Entrance Stairs-Rails: Left;Right;Can reach both Home Layout: One level     Bathroom Shower/Tub: Tub/shower unit Shower/tub characteristics: Door Biochemist, clinical: Standard     Home Equipment: Cane - single point;Shower seat;Bedside commode          Prior Functioning/Environment Level of Independence: Independent        Comments: she uses a SPC and RW as needed. Furniture walks. Drives. Cooks, cleans.         OT Problem List: Decreased strength;Decreased activity tolerance;Impaired balance (sitting and/or standing);Cardiopulmonary status limiting activity   OT Treatment/Interventions: Self-care/ADL training;Therapeutic exercise;Energy conservation;DME and/or AE instruction;Therapeutic activities;Patient/family education;Balance training    OT Goals(Current goals can be found in the care plan section) Acute Rehab OT Goals Patient Stated Goal: to go home OT Goal Formulation: With patient Time For Goal Achievement: 12/31/16 Potential to Achieve Goals: Good ADL Goals Pt Will Perform Tub/Shower Transfer: with supervision;Tub transfer;ambulating;rolling walker Pt/caregiver will Perform Home Exercise Program: Increased strength;Both right and left upper extremity;Independently;With written HEP provided;With theraband Additional ADL Goal #1: Pt will independently verbally recall 3 energy conservation strategies.  OT Frequency: Min 2X/week   Barriers to D/C:            Co-evaluation              End of Session Equipment Utilized During Treatment: Surveyor, mining Communication: Mobility status;Other (comment) (increased SOB with activity)  Activity Tolerance: Patient tolerated treatment well Patient left: in bed;with call bell/phone within reach;with bed alarm set   Time: 8682-5749 OT Time Calculation (min): 21 min Charges:  OT General Charges $OT Visit: 1 Procedure OT Evaluation $OT Eval Moderate Complexity: 1  Procedure G-Codes:     Binnie Kand M.S., OTR/L Pager: [phone]  12/17/2016, 10:55 AM

## 2017-01-02 ENCOUNTER — Emergency Department (HOSPITAL_COMMUNITY): Payer: Medicare Other

## 2017-01-02 ENCOUNTER — Encounter (HOSPITAL_COMMUNITY): Payer: Self-pay | Admitting: Nurse Practitioner

## 2017-01-02 ENCOUNTER — Observation Stay (HOSPITAL_COMMUNITY)
Admission: EM | Admit: 2017-01-02 | Discharge: 2017-01-04 | Disposition: A | Discharge status: Home / Self Care | Payer: Medicare Other | Attending: Family Medicine | Admitting: Family Medicine

## 2017-01-02 DIAGNOSIS — X58XXXA Exposure to other specified factors, initial encounter: Secondary | ICD-10-CM | POA: Diagnosis not present

## 2017-01-02 DIAGNOSIS — D696 Thrombocytopenia, unspecified: Secondary | ICD-10-CM | POA: Diagnosis present

## 2017-01-02 DIAGNOSIS — Z794 Long term (current) use of insulin: Secondary | ICD-10-CM | POA: Diagnosis not present

## 2017-01-02 DIAGNOSIS — E1122 Type 2 diabetes mellitus with diabetic chronic kidney disease: Secondary | ICD-10-CM | POA: Diagnosis not present

## 2017-01-02 DIAGNOSIS — Z85118 Personal history of other malignant neoplasm of bronchus and lung: Secondary | ICD-10-CM | POA: Diagnosis not present

## 2017-01-02 DIAGNOSIS — S76911A Strain of unspecified muscles, fascia and tendons at thigh level, right thigh, initial encounter: Principal | ICD-10-CM | POA: Insufficient documentation

## 2017-01-02 DIAGNOSIS — Z955 Presence of coronary angioplasty implant and graft: Secondary | ICD-10-CM | POA: Diagnosis not present

## 2017-01-02 DIAGNOSIS — E039 Hypothyroidism, unspecified: Secondary | ICD-10-CM | POA: Diagnosis not present

## 2017-01-02 DIAGNOSIS — I4891 Unspecified atrial fibrillation: Secondary | ICD-10-CM | POA: Diagnosis present

## 2017-01-02 DIAGNOSIS — Z8673 Personal history of transient ischemic attack (TIA), and cerebral infarction without residual deficits: Secondary | ICD-10-CM | POA: Insufficient documentation

## 2017-01-02 DIAGNOSIS — Y999 Unspecified external cause status: Secondary | ICD-10-CM | POA: Diagnosis not present

## 2017-01-02 DIAGNOSIS — N183 Chronic kidney disease, stage 3 (moderate): Secondary | ICD-10-CM | POA: Diagnosis not present

## 2017-01-02 DIAGNOSIS — M25551 Pain in right hip: Secondary | ICD-10-CM

## 2017-01-02 DIAGNOSIS — Y929 Unspecified place or not applicable: Secondary | ICD-10-CM | POA: Diagnosis not present

## 2017-01-02 DIAGNOSIS — I129 Hypertensive chronic kidney disease with stage 1 through stage 4 chronic kidney disease, or unspecified chronic kidney disease: Secondary | ICD-10-CM | POA: Diagnosis not present

## 2017-01-02 DIAGNOSIS — T148XXA Other injury of unspecified body region, initial encounter: Secondary | ICD-10-CM | POA: Diagnosis present

## 2017-01-02 DIAGNOSIS — I2510 Atherosclerotic heart disease of native coronary artery without angina pectoris: Secondary | ICD-10-CM | POA: Diagnosis not present

## 2017-01-02 DIAGNOSIS — S79921A Unspecified injury of right thigh, initial encounter: Secondary | ICD-10-CM | POA: Diagnosis present

## 2017-01-02 DIAGNOSIS — Z79899 Other long term (current) drug therapy: Secondary | ICD-10-CM | POA: Diagnosis not present

## 2017-01-02 DIAGNOSIS — Y939 Activity, unspecified: Secondary | ICD-10-CM | POA: Diagnosis not present

## 2017-01-02 DIAGNOSIS — D649 Anemia, unspecified: Secondary | ICD-10-CM | POA: Diagnosis present

## 2017-01-02 DIAGNOSIS — E876 Hypokalemia: Secondary | ICD-10-CM | POA: Diagnosis present

## 2017-01-02 DIAGNOSIS — E118 Type 2 diabetes mellitus with unspecified complications: Secondary | ICD-10-CM | POA: Diagnosis present

## 2017-01-02 LAB — CBC WITH DIFFERENTIAL/PLATELET
Basophils Absolute: 0 10*3/uL (ref 0.0–0.1)
Basophils Relative: 1 %
EOS ABS: 0 10*3/uL (ref 0.0–0.7)
EOS PCT: 1 %
HCT: 33.9 % — ABNORMAL LOW (ref 36.0–46.0)
Hemoglobin: 12.1 g/dL (ref 12.0–15.0)
LYMPHS ABS: 1.7 10*3/uL (ref 0.7–4.0)
Lymphocytes Relative: 33 %
MCH: 30.2 pg (ref 26.0–34.0)
MCHC: 35.7 g/dL (ref 30.0–36.0)
MCV: 84.5 fL (ref 78.0–100.0)
Monocytes Absolute: 0.9 10*3/uL (ref 0.1–1.0)
Monocytes Relative: 17 %
Neutro Abs: 2.6 10*3/uL (ref 1.7–7.7)
Neutrophils Relative %: 48 %
PLATELETS: 107 10*3/uL — AB (ref 150–400)
RBC: 4.01 MIL/uL (ref 3.87–5.11)
RDW: 12.5 % (ref 11.5–15.5)
WBC: 5.3 10*3/uL (ref 4.0–10.5)

## 2017-01-02 LAB — COMPREHENSIVE METABOLIC PANEL
ALT: 13 U/L — AB (ref 14–54)
AST: 14 U/L — ABNORMAL LOW (ref 15–41)
Albumin: 3.6 g/dL (ref 3.5–5.0)
Alkaline Phosphatase: 73 U/L (ref 38–126)
Anion gap: 12 (ref 5–15)
BUN: 13 mg/dL (ref 6–20)
CHLORIDE: 103 mmol/L (ref 101–111)
CO2: 23 mmol/L (ref 22–32)
CREATININE: 1.16 mg/dL — AB (ref 0.44–1.00)
Calcium: 8.7 mg/dL — ABNORMAL LOW (ref 8.9–10.3)
GFR calc non Af Amer: 43 mL/min — ABNORMAL LOW (ref >60)
GFR, EST AFRICAN AMERICAN: 50 mL/min — AB (ref >60)
Glucose, Bld: 161 mg/dL — ABNORMAL HIGH (ref 65–99)
Potassium: 3 mmol/L — ABNORMAL LOW (ref 3.5–5.1)
SODIUM: 138 mmol/L (ref 135–145)
Total Bilirubin: 1 mg/dL (ref 0.3–1.2)
Total Protein: 6.3 g/dL — ABNORMAL LOW (ref 6.5–8.1)

## 2017-01-02 MED ORDER — ONDANSETRON HCL 4 MG/2ML IJ SOLN
4.0000 mg | Freq: Once | INTRAMUSCULAR | Status: AC
  Administered 2017-01-02: 4 mg via INTRAVENOUS
  Filled 2017-01-02: qty 2

## 2017-01-02 MED ORDER — MORPHINE SULFATE (PF) 4 MG/ML IV SOLN
4.0000 mg | Freq: Once | INTRAVENOUS | Status: AC
  Administered 2017-01-02: 4 mg via INTRAVENOUS
  Filled 2017-01-02: qty 1

## 2017-01-02 NOTE — ED Notes (Signed)
Pt returned from radiology.

## 2017-01-02 NOTE — ED Triage Notes (Signed)
Pt presents with c/o R leg pain. The pain began on Monday. The pain radiates from her R anterior thigh to her knee. She denies any injuries, falls. She has been taking tylenol with no relief. She has had to ask for assistance from family members for ADLs because it is too painful to bear weight on the leg.

## 2017-01-02 NOTE — ED Notes (Signed)
Pt returned to room from x-ray, reports nausea and vomited in emesis bag. MD to be notified shortly.

## 2017-01-02 NOTE — ED Notes (Signed)
Pt returned to room from MRI. Placed on bedpan, she reports her leg is feeling better than it was before.

## 2017-01-02 NOTE — ED Notes (Signed)
Pt says pain is decreased but she is now noticing weakness on that right leg.  Pt states she is " having a hard time lifting the leg and it feels numb"

## 2017-01-02 NOTE — ED Notes (Signed)
Patient transported to MRI 

## 2017-01-02 NOTE — ED Notes (Signed)
Pt is c/o right sided leg pain that radiates from her hip to her knee.  Pt thinks this might be due to a change in her Mx. Pt able to ambulate with assistance.

## 2017-01-03 ENCOUNTER — Encounter (HOSPITAL_COMMUNITY): Payer: Self-pay | Admitting: General Practice

## 2017-01-03 DIAGNOSIS — E118 Type 2 diabetes mellitus with unspecified complications: Secondary | ICD-10-CM

## 2017-01-03 DIAGNOSIS — D649 Anemia, unspecified: Secondary | ICD-10-CM | POA: Diagnosis not present

## 2017-01-03 DIAGNOSIS — D696 Thrombocytopenia, unspecified: Secondary | ICD-10-CM

## 2017-01-03 DIAGNOSIS — I482 Chronic atrial fibrillation: Secondary | ICD-10-CM

## 2017-01-03 DIAGNOSIS — T148XXA Other injury of unspecified body region, initial encounter: Secondary | ICD-10-CM | POA: Diagnosis not present

## 2017-01-03 DIAGNOSIS — M25551 Pain in right hip: Secondary | ICD-10-CM | POA: Diagnosis not present

## 2017-01-03 DIAGNOSIS — E876 Hypokalemia: Secondary | ICD-10-CM

## 2017-01-03 LAB — GLUCOSE, CAPILLARY
GLUCOSE-CAPILLARY: 120 mg/dL — AB (ref 65–99)
GLUCOSE-CAPILLARY: 209 mg/dL — AB (ref 65–99)
Glucose-Capillary: 166 mg/dL — ABNORMAL HIGH (ref 65–99)
Glucose-Capillary: 198 mg/dL — ABNORMAL HIGH (ref 65–99)
Glucose-Capillary: 108 mg/dL — ABNORMAL HIGH (ref 65–99)

## 2017-01-03 LAB — CBC
HEMATOCRIT: 33 % — AB (ref 36.0–46.0)
Hemoglobin: 11.4 g/dL — ABNORMAL LOW (ref 12.0–15.0)
MCH: 29.8 pg (ref 26.0–34.0)
MCHC: 34.5 g/dL (ref 30.0–36.0)
MCV: 86.2 fL (ref 78.0–100.0)
PLATELETS: 101 10*3/uL — AB (ref 150–400)
RBC: 3.83 MIL/uL — ABNORMAL LOW (ref 3.87–5.11)
RDW: 13 % (ref 11.5–15.5)
WBC: 4.7 10*3/uL (ref 4.0–10.5)

## 2017-01-03 LAB — BASIC METABOLIC PANEL
Anion gap: 10 (ref 5–15)
BUN: 14 mg/dL (ref 6–20)
CO2: 24 mmol/L (ref 22–32)
CREATININE: 0.99 mg/dL (ref 0.44–1.00)
Calcium: 8.5 mg/dL — ABNORMAL LOW (ref 8.9–10.3)
Chloride: 106 mmol/L (ref 101–111)
GFR calc Af Amer: >60 mL/min (ref >60)
GFR, EST NON AFRICAN AMERICAN: 52 mL/min — AB (ref >60)
GLUCOSE: 155 mg/dL — AB (ref 65–99)
Potassium: 3.9 mmol/L (ref 3.5–5.1)
SODIUM: 140 mmol/L (ref 135–145)

## 2017-01-03 LAB — MAGNESIUM: MAGNESIUM: 1.2 mg/dL — AB (ref 1.7–2.4)

## 2017-01-03 MED ORDER — POTASSIUM CHLORIDE CRYS ER 20 MEQ PO TBCR
40.0000 meq | EXTENDED_RELEASE_TABLET | Freq: Once | ORAL | Status: AC
  Administered 2017-01-03: 40 meq via ORAL
  Filled 2017-01-03: qty 2

## 2017-01-03 MED ORDER — TRAMADOL HCL 50 MG PO TABS
50.0000 mg | ORAL_TABLET | Freq: Four times a day (QID) | ORAL | Status: DC | PRN
  Administered 2017-01-03: 100 mg via ORAL
  Filled 2017-01-03: qty 2

## 2017-01-03 MED ORDER — MORPHINE SULFATE (PF) 4 MG/ML IV SOLN: 2.0000 mg | INTRAVENOUS | Status: DC | PRN

## 2017-01-03 MED ORDER — RIVAROXABAN 15 MG PO TABS
15.0000 mg | ORAL_TABLET | Freq: Every day | ORAL | Status: DC
  Administered 2017-01-03 – 2017-01-04 (×2): 15 mg via ORAL
  Filled 2017-01-03 (×2): qty 1

## 2017-01-03 MED ORDER — ACETAMINOPHEN 500 MG PO TABS: 500.0000 mg | ORAL_TABLET | Freq: Four times a day (QID) | ORAL | Status: DC | PRN

## 2017-01-03 MED ORDER — MUPIROCIN 2 % EX OINT: 1.0000 "application " | TOPICAL_OINTMENT | Freq: Every day | CUTANEOUS | Status: DC | PRN

## 2017-01-03 MED ORDER — ONDANSETRON HCL 4 MG/2ML IJ SOLN: 4.0000 mg | Freq: Four times a day (QID) | INTRAMUSCULAR | Status: DC | PRN

## 2017-01-03 MED ORDER — ALBUTEROL SULFATE (2.5 MG/3ML) 0.083% IN NEBU: 2.5000 mg | INHALATION_SOLUTION | RESPIRATORY_TRACT | Status: DC | PRN

## 2017-01-03 MED ORDER — AMIODARONE HCL 200 MG PO TABS
200.0000 mg | ORAL_TABLET | Freq: Every day | ORAL | Status: DC
  Administered 2017-01-03 – 2017-01-04 (×2): 200 mg via ORAL
  Filled 2017-01-03 (×2): qty 1

## 2017-01-03 MED ORDER — HYDROXYZINE HCL 25 MG PO TABS: 25.0000 mg | ORAL_TABLET | Freq: Three times a day (TID) | ORAL | Status: DC | PRN

## 2017-01-03 MED ORDER — ONDANSETRON HCL 4 MG PO TABS: 4.0000 mg | ORAL_TABLET | Freq: Four times a day (QID) | ORAL | Status: DC | PRN

## 2017-01-03 MED ORDER — PANTOPRAZOLE SODIUM 40 MG PO TBEC
40.0000 mg | DELAYED_RELEASE_TABLET | Freq: Every day | ORAL | Status: DC
  Administered 2017-01-03 – 2017-01-04 (×2): 40 mg via ORAL
  Filled 2017-01-03 (×2): qty 1

## 2017-01-03 MED ORDER — DILTIAZEM HCL ER COATED BEADS 240 MG PO CP24
240.0000 mg | ORAL_CAPSULE | Freq: Every day | ORAL | Status: DC
  Administered 2017-01-03 – 2017-01-04 (×2): 240 mg via ORAL
  Filled 2017-01-03: qty 2
  Filled 2017-01-03: qty 1
  Filled 2017-01-03: qty 2
  Filled 2017-01-03: qty 1

## 2017-01-03 MED ORDER — INSULIN DETEMIR 100 UNIT/ML ~~LOC~~ SOLN
15.0000 [IU] | Freq: Every day | SUBCUTANEOUS | Status: DC
  Administered 2017-01-03 – 2017-01-04 (×2): 15 [IU] via SUBCUTANEOUS
  Filled 2017-01-03 (×2): qty 0.15

## 2017-01-03 MED ORDER — MORPHINE SULFATE (PF) 4 MG/ML IV SOLN
4.0000 mg | Freq: Once | INTRAVENOUS | Status: AC
  Administered 2017-01-03: 4 mg via INTRAVENOUS
  Filled 2017-01-03: qty 1

## 2017-01-03 MED ORDER — LEVOTHYROXINE SODIUM 100 MCG PO TABS
100.0000 ug | ORAL_TABLET | Freq: Every day | ORAL | Status: DC
  Administered 2017-01-03 – 2017-01-04 (×2): 100 ug via ORAL
  Filled 2017-01-03 (×2): qty 1

## 2017-01-03 MED ORDER — METOPROLOL TARTRATE 25 MG PO TABS
50.0000 mg | ORAL_TABLET | Freq: Two times a day (BID) | ORAL | Status: DC
  Administered 2017-01-03 – 2017-01-04 (×3): 50 mg via ORAL
  Filled 2017-01-03 (×3): qty 2

## 2017-01-03 MED ORDER — MECLIZINE HCL 25 MG PO TABS: 25.0000 mg | ORAL_TABLET | Freq: Four times a day (QID) | ORAL | Status: DC | PRN

## 2017-01-03 MED ORDER — TRAMADOL HCL 50 MG PO TABS
50.0000 mg | ORAL_TABLET | Freq: Four times a day (QID) | ORAL | Status: DC | PRN
  Administered 2017-01-03: 50 mg via ORAL
  Filled 2017-01-03: qty 1

## 2017-01-03 MED ORDER — INSULIN ASPART 100 UNIT/ML ~~LOC~~ SOLN
0.0000 [IU] | Freq: Three times a day (TID) | SUBCUTANEOUS | Status: DC
  Administered 2017-01-03 – 2017-01-04 (×3): 2 [IU] via SUBCUTANEOUS

## 2017-01-03 MED ORDER — ALPRAZOLAM 0.25 MG PO TABS
0.2500 mg | ORAL_TABLET | Freq: Every evening | ORAL | Status: DC | PRN
  Administered 2017-01-03: 0.25 mg via ORAL
  Filled 2017-01-03: qty 1

## 2017-01-03 NOTE — Care Management Obs Status (Signed)
[facility] NOTIFICATION   Patient Details  Name: [name] MRN: [id number] Date of Birth: [birth year]   Medicare Observation Status Notification Given:  Yes    Dellie Catholic, RN 01/03/2017, 5:18 PM

## 2017-01-03 NOTE — Progress Notes (Signed)
Patient seen and evaluated earlier this am by my associate. Plan will be to work on effective pain control. Pt has had tachycardia I think associated to poor pain control.  Will increase tramadol dose. If pain well controlled next am will plan on discharging patient.  [name], [employer]

## 2017-01-03 NOTE — H&P (Signed)
History and Physical    [name] EXB:[id number] DOB: [birth year] DOA: 01/02/2017  Referring MD/NP/PA: Dr. Venora Maples PCP: Dwan Bolt, MD  Patient coming from: Home  Chief Complaint: Right hip pain  HPI: [name] is a 83 y.o. female with medical history significant of HTN, afib, DM type II, CAD, lung Ca, and CVA; who presents with one-week history of right hip pain. Describes pain as awful and achy.Patient denies any recent trauma, fall, or strenuous activity to her knowledge. Only thing she can think of as a possibility is picking up a ham and that she uses her legs to put the recliner back down. Patient is normally able to ambulate without assistance of a walker or cane. However during this week she is needed to use a walker to ambulate. She is tried to take Tylenol for pain without relief of symptoms. Any weightbearing or movement of the leg causes significant   ED Course: On admission to the emergency department patient was found to be in A. fib with heart rates in 110's. X-ray showed no acute fracture. MRI of the head was ordered which showed signs of a muscle strain of the gluteus medius.  Review of Systems: As per HPI otherwise 10 point review of systems negative.   Past Medical History:  Diagnosis Date  . CAD (coronary artery disease) 03/13/2012  . Diabetes mellitus   . Dysrhythmia    ATRIAL FIB  . HTN (hypertension) 03/13/2012  . Hypothyroid 03/13/2012  . Lung cancer ([place])   . Shortness of breath   . Stroke ([place])   . Syncope and collapse 04/02/2016  . Type II or unspecified type diabetes mellitus without mention of complication, not stated as uncontrolled 03/13/2012    Past Surgical History:  Procedure Laterality Date  . ABDOMINAL HYSTERECTOMY    . BREAST SURGERY     benign tumors removed in 1980  . CARDIAC CATHETERIZATION    . CARDIOVERSION  04/20/2012   Procedure: CARDIOVERSION;  Surgeon: Laverda Page, MD;  Location: [place];  Service: Cardiovascular;   Laterality: N/A;  . CARDIOVERSION N/A 05/10/2013   Procedure: CARDIOVERSION;  Surgeon: Laverda Page, MD;  Location: [place];  Service: Cardiovascular;  Laterality: N/A;  . CHOLECYSTECTOMY    . HEMORRHOID SURGERY    . jaw replacement    . LEFT HEART CATHETERIZATION WITH CORONARY ANGIOGRAM N/A 04/20/2015   Procedure: LEFT HEART CATHETERIZATION WITH CORONARY ANGIOGRAM;  Surgeon: Adrian Prows, MD;  Location: [facility] C[facility];  Service: Cardiovascular;  Laterality: N/A;  . tumors     back of head     reports that she has never smoked. She has never used smokeless tobacco. She reports that she does not drink alcohol or use drugs.  Allergies  Allergen Reactions  . Codeine Nausea And Vomiting  . Hydrocodone-Acetaminophen Nausea And Vomiting    Family History  Problem Relation Age of Onset  . Breast cancer Mother   . Aneurysm Father     Likely thoracic aneurysm per patient  . Melanoma Brother   . Heart disease Sister   . Diabetes type II Sister     Prior to Admission medications   Medication Sig Start Date End Date Taking? Authorizing Provider  acetaminophen (TYLENOL) 500 MG tablet Take 500 mg by mouth every 6 (six) hours as needed for moderate pain or headache.    Yes Historical Provider, MD  ALPRAZolam (XANAX) 0.25 MG tablet Take 1 tablet (0.25 mg total) by mouth at bedtime. 12/17/16  Yes Dixie Dials, MD  amiodarone (PACERONE) 200 MG tablet Take 1 tablet (200 mg total) by mouth daily. 12/18/16  Yes Dixie Dials, MD  Calcium Carb-Cholecalciferol (CALCIUM 600-D PO) Take 600 mg by mouth daily.   Yes Historical Provider, MD  colesevelam [facility]) 625 MG tablet Take 312.5-625 mg by mouth daily as needed (diarrhea).   Yes Historical Provider, MD  diltiazem (CARDIZEM CD) 240 MG 24 hr capsule Take 1 capsule (240 mg total) by mouth daily. 12/18/16  Yes Dixie Dials, MD  hydrOXYzine (ATARAX/VISTARIL) 25 MG tablet Take 1 tablet (25 mg total) by mouth every 8 (eight) hours as needed for itching.  12/17/16  Yes Dixie Dials, MD  Insulin Detemir (LEVEMIR) 100 UNIT/ML Pen Inject 20 Units into the skin daily at 10 pm. Patient taking differently: Inject 20 Units into the skin every morning.  12/17/16  Yes Dixie Dials, MD  levothyroxine (SYNTHROID, LEVOTHROID) 100 MCG tablet Take 1 tablet (100 mcg total) by mouth daily before breakfast. Patient taking differently: Take 150 mcg by mouth daily before breakfast.  12/17/16  Yes Dixie Dials, MD  meclizine (ANTIVERT) 25 MG tablet Take 25 mg by mouth 4 (four) times daily as needed for dizziness.   Yes Historical Provider, MD  metoprolol (LOPRESSOR) 50 MG tablet Take 50 mg by mouth 2 (two) times daily.   Yes Historical Provider, MD  pantoprazole (PROTONIX) 40 MG tablet Take 1 tablet (40 mg total) by mouth daily. 12/18/16  Yes Dixie Dials, MD  Rivaroxaban (XARELTO) 15 MG TABS tablet Take 15 mg by mouth daily with supper. 03/16/12  Yes Adrian Prows, MD  clobetasol cream (TEMOVATE) 8.41 % Apply 1 application topically 2 (two) times daily as needed (itching/ do not use on face).    Historical Provider, MD  mupirocin ointment (BACTROBAN) 2 % Apply 1 application topically daily as needed (itching).     Historical Provider, MD  pramoxine (SARNA SENSITIVE) 1 % LOTN Apply 1 application topically 3 (three) times daily as needed (itching).    Historical Provider, MD    Physical Exam:   Constitutional:Elderly female who appears to be in moderate distress with any movement  Vitals:   01/02/17 2330 01/02/17 2345 01/03/17 0047 01/03/17 0052  BP: 124/83 129/79 139/88   Pulse:      Resp:   18   Temp:      TempSrc:      SpO2:    98%  Weight:      Height:       Eyes: PERRL, lids and conjunctivae normal ENMT: Mucous membranes are moist. Posterior pharynx clear of any exudate or lesions. Neck: normal, supple, no masses, no thyromegaly Respiratory: clear to auscultation bilaterally, no wheezing, no crackles. Normal respiratory effort. No accessory muscle use.    Cardiovascular: Irregular regular, no murmurs / rubs / gallops. No extremity edema. 2+ pedal pulses. No carotid bruits.  Abdomen: no tenderness, no masses palpated. No hepatosplenomegaly. Bowel sounds positive.  Musculoskeletal: no clubbing / cyanosis. No joint deformity upper and lower extremities. Tenderness to palpation of the right hip with decreased range of motion due to pain. Skin: no rashes, lesions, ulcers. No induration Neurologic: CN 2-12 grossly intact. Sensation intact, DTR normal. Strength 5/5 in all 4.  Psychiatric: Normal judgment and insight. Alert and oriented x 3. Normal mood.     Labs on Admission: I have personally reviewed following labs and imaging studies  CBC:  Recent Labs Lab 01/02/17 1745  WBC 5.3  NEUTROABS 2.6  HGB 12.1  HCT 33.9*  MCV 84.5  PLT 245*   Basic Metabolic Panel:  Recent Labs Lab 01/02/17 1745  NA 138  K 3.0*  CL 103  CO2 23  GLUCOSE 161*  BUN 13  CREATININE 1.16*  CALCIUM 8.7*   GFR: Estimated Creatinine Clearance: 32 mL/min (by C-G formula based on SCr of 1.16 mg/dL (H)). Liver Function Tests:  Recent Labs Lab 01/02/17 1745  AST 14*  ALT 13*  ALKPHOS 73  BILITOT 1.0  PROT 6.3*  ALBUMIN 3.6   No results for input(s): LIPASE, AMYLASE in the last 168 hours. No results for input(s): AMMONIA in the last 168 hours. Coagulation Profile: No results for input(s): INR, PROTIME in the last 168 hours. Cardiac Enzymes: No results for input(s): CKTOTAL, CKMB, CKMBINDEX, TROPONINI in the last 168 hours. BNP (last 3 results) No results for input(s): PROBNP in the last 8760 hours. HbA1C: No results for input(s): HGBA1C in the last 72 hours. CBG: No results for input(s): GLUCAP in the last 168 hours. Lipid Profile: No results for input(s): CHOL, HDL, LDLCALC, TRIG, CHOLHDL, LDLDIRECT in the last 72 hours. Thyroid Function Tests: No results for input(s): TSH, T4TOTAL, FREET4, T3FREE, THYROIDAB in the last 72 hours. Anemia  Panel: No results for input(s): VITAMINB12, FOLATE, FERRITIN, TIBC, IRON, RETICCTPCT in the last 72 hours. Urine analysis:    Component Value Date/Time   COLORURINE YELLOW 12/12/2016 2347   APPEARANCEUR CLEAR 12/12/2016 2347   LABSPEC 1.011 12/12/2016 2347   PHURINE 6.0 12/12/2016 2347   GLUCOSEU NEGATIVE 12/12/2016 2347   HGBUR NEGATIVE 12/12/2016 2347   BILIRUBINUR NEGATIVE 12/12/2016 2347   KETONESUR NEGATIVE 12/12/2016 2347   PROTEINUR NEGATIVE 12/12/2016 2347   UROBILINOGEN 1.0 04/17/2015 1838   NITRITE NEGATIVE 12/12/2016 2347   LEUKOCYTESUR MODERATE (A) 12/12/2016 2347   Sepsis Labs: No results found for this or any previous visit (from the past 240 hour(s)).   Radiological Exams on Admission: Mr Hip Right Wo Contrast  Result Date: 01/02/2017 CLINICAL DATA:  Severe right hip pain x1 week.  No known injury. EXAM: MR OF THE RIGHT HIP WITHOUT CONTRAST TECHNIQUE: Multiplanar, multisequence MR imaging was performed. No intravenous contrast was administered. COMPARISON:  Same date radiographs of the pelvis and hip. FINDINGS: Bones: No acute fracture bone destruction. Hip joints are maintained bilaterally. No sacral insufficiency fracture. Pubic rami are intact. Articular cartilage and labrum Articular cartilage: Mild partial thickness thinning of the articular cartilage of the right hip. Labrum:  No labral tear. Joint or bursal effusion Joint effusion: Small right hip joint effusion. Trace left hip joint effusion. Bursae:  None Muscles and tendons Muscles and tendons: Intramuscular edema of the gluteus minimus, obturator externus, adductor brevis and magnus muscles consistent with muscle strain. No full-thickness tear. No intramuscular hematoma. Other findings Miscellaneous: Tarlov cyst of the sacrum. Lower lumbar degenerative disc disease from L3 through S1. Unremarkable bladder. No pelvic adenopathy. IMPRESSION: No acute fracture of the pelvis nor right hip. Muscle strain of the gluteus  minimus, obturator externus and adductor muscles. Small right hip joint effusion. No labral tear. Partial thinning of right hip cartilage. Small Tarlov cyst of the sacrum. Electronically Signed   By: Ashley Royalty M.D.   On: 01/02/2017 22:07   Dg Hip Unilat W Or Wo Pelvis 2-3 Views Right  Result Date: 01/02/2017 CLINICAL DATA:  Initial evaluation for acute right hip pain. No injury. EXAM: DG HIP (WITH OR WITHOUT PELVIS) 2-3V RIGHT COMPARISON:  None available. FINDINGS: Bones are diffusely osteopenic, somewhat  limiting evaluation for subtle acute nondisplaced fractures. No acute fracture or dislocation. Femoral heads in normal alignment within the acetabula. Femoral head heights preserved. 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 Bony pelvis intact. SI joints approximated. Degenerative changes noted within the lower lumbar spine. No acute soft tissue abnormality. IMPRESSION: 1. No acute osseous abnormality about the hips. 2. 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 3. Prominent degenerative changes within the lower lumbar spine. 4. Osteopenia. Electronically Signed   By: Jeannine Boga M.D.   On: 01/02/2017 19:30    EKG: Independently reviewed. A. fib rate 102  Assessment/Plan Right hip pain secondary to muscle strain: Acute. The exact cause unknown at this time. Imaging studies show no fracture, but reveals muscle strain. - Admit to telemetry bed - Tramdol /morphine IV prn moderate to severe pain respectively - Physical therapy to eval and treat  Hypokalemia: Patient initially presents with a potassium of 3. Given 40 mEq potassium chloride in the ED. - Continue to monitor and replace potassium as needed - Check magnesium level  Hypothyroidism: Patient previously noted to have TSH of 0.04 on 12/12/2016. - Continued levothyroxine 100 mcg daily as prescribed. Patient has been taking 150 mcg - Question need of dose adjustment.  Atrial  fibrillation: Chadvasc= 7 - Continue metoprolol, amiodarone, diltiazem, and Xarelto  Diabetes mellitus type 2: Last hemoglobin A1c was noted to be 8 on 12/24/2016. - Hypoglycemic protocols - Reduced home Levemir dose from 20 down to 15 units subcutaneous while hospitalized. - CBG every before meals with sensitive SSI  Anxiety - Continue xanax prn  Jerrye Bushy  - continue protonix   Anemia and thrombocytopenia: Stable. Patient with no signs of bleeding Hbg 12.1 and platelet count 107. - Continue to monitor   DVT prophylaxis: Xarelto Code Status:Full Family Communication:  No family present at bedside Disposition Plan: Likely discharge home once medically stable Consults called: none Admission status: observation   Norval Morton MD [facility] Pager [phone]  If 7PM-7AM, please contact night-coverage [email or web address] Password [email or web address]  01/03/2017, 1:05 AM

## 2017-01-03 NOTE — Evaluation (Signed)
Physical Therapy Evaluation Patient Details Name: [name] MRN: [id number] DOB: Jan 26, 1934 Today's Date: 01/03/2017   History of Present Illness  [name] is a 83 y.o. female with medical history significant of HTN, afib, DM type II, CAD, lung Ca, and CVA; who presents with one-week history of right hip pain. Describes pain as awful and achy. Xray reavealed no fracture but MRI revealed strained R glut medius.  Clinical Impression  Pt admitted with above diagnosis. Pt currently with functional limitations due to the deficits listed below (see PT Problem List). Pt with R hip pain due to glut medius strain requiring assist for all transfers and ADLs at this time due to pain.  Pt will benefit from skilled PT to increase their independence and safety with mobility to allow discharge to the venue listed below.       Follow Up Recommendations Home health PT;Supervision for mobility/OOB;Supervision/Assistance - 24 hour    Equipment Recommendations  3in1 (PT)    Recommendations for Other Services       Precautions / Restrictions Precautions Precautions: Fall Restrictions Weight Bearing Restrictions: No (self WBAT on R LE due to pain)      Mobility  Bed Mobility Overal bed mobility: Needs Assistance Bed Mobility: Rolling;Sidelying to Sit Rolling: Modified independent (Device/Increase time) (used bed rail) Sidelying to sit: Min assist       General bed mobility comments: minA for trunk elevation  Transfers Overall transfer level: Needs assistance Equipment used: Rolling walker (2 wheeled) Transfers: Sit to/from Stand Sit to Stand: Min assist         General transfer comment: increased time, minA to steady pt during transition of hands from bed to RW  Ambulation/Gait Ambulation/Gait assistance: Min assist Ambulation Distance (Feet): 15 Feet (x2, to/from bathroom) Assistive device: Rolling walker (2 wheeled) Gait Pattern/deviations: Step-to  pattern;Antalgic;Decreased step length - left;Decreased stance time - right;Decreased weight shift to right Gait velocity: decreased   General Gait Details: slow, painful, unsteady  Stairs Stairs: Yes Stairs assistance: Min assist Stair Management: One rail Right;Sideways Number of Stairs: 3 General stair comments: educated on "up with the good, down with the bad" pt returned demonstrated ascneding with L LE and descending with R LE. minA as precaution and for v/c's for sequencing  Wheelchair Mobility    Modified Rankin (Stroke Patients Only)       Balance Overall balance assessment: Needs assistance Sitting-balance support: Feet supported;No upper extremity supported Sitting balance-Leahy Scale: Good     Standing balance support: During functional activity;No upper extremity supported Standing balance-Leahy Scale: Fair Standing balance comment: pt able to wash hands at sink without LOB or UE support                             Pertinent Vitals/Pain Pain Assessment: 0-10 Pain Score: 8  Pain Location: R hip down to knee Pain Descriptors / Indicators: Aching Pain Intervention(s): RN gave pain meds during session    Home Living Family/patient expects to be discharged to:: Private residence Living Arrangements: Spouse/significant other Available Help at Discharge: Family;Available 24 hours/day (elderly spouse available, kids out of town) Type of Home: House Home Access: Stairs to enter Entrance Stairs-Rails: Left;Right;Can reach both Technical brewer of Steps: 8 Home Layout: One level Home Equipment: Environmental consultant - 2 wheels;Cane - single point      Prior Function Level of Independence: Independent         Comments: was amb without AD  until this injury now uses RW     Hand Dominance        Extremity/Trunk Assessment   Upper Extremity Assessment Upper Extremity Assessment: Generalized weakness    Lower Extremity Assessment Lower Extremity  Assessment: RLE deficits/detail RLE Deficits / Details: able to complete LAQ but unable to tolerate resistance to pain    Cervical / Trunk Assessment Cervical / Trunk Assessment: Kyphotic  Communication   Communication: No difficulties  Cognition Arousal/Alertness: Awake/alert Behavior During Therapy: WFL for tasks assessed/performed Overall Cognitive Status: Within Functional Limits for tasks assessed                      General Comments General comments (skin integrity, edema, etc.): assist to bathroom, due to low surface height pt requierd modA for transfer and to pull up on handrail    Exercises General Exercises - Lower Extremity Long Arc Quad: AROM;Right;10 reps;Seated   Assessment/Plan    PT Assessment Patient needs continued PT services  PT Problem List Decreased strength;Decreased mobility;Decreased balance;Decreased activity tolerance;Decreased knowledge of use of DME          PT Treatment Interventions DME instruction;Therapeutic activities;Gait training;Therapeutic exercise;Patient/family education;Balance training;Stair training;Functional mobility training    PT Goals (Current goals can be found in the Care Plan section)  Acute Rehab PT Goals Patient Stated Goal: to go home PT Goal Formulation: With patient Time For Goal Achievement: 01/10/17 Potential to Achieve Goals: Good    Frequency Min 3X/week   Barriers to discharge   spouse is elderly    Co-evaluation               End of Session Equipment Utilized During Treatment: Gait belt Activity Tolerance: Patient tolerated treatment well Patient left: in chair;with call bell/phone within reach Nurse Communication: Mobility status    Functional Assessment Tool Used: clinical judgement Functional Limitation: Mobility: Walking and moving around Mobility: Walking and Moving Around Current Status [phone]): At least 40 percent but less than 60 percent impaired, limited or restricted Mobility:  Walking and Moving Around Goal Status [phone]): At least 1 percent but less than 20 percent impaired, limited or restricted    Time: 0739-0818 PT Time Calculation (min) (ACUTE ONLY): 39 min   Charges:   PT Evaluation $PT Eval Moderate Complexity: 1 Procedure PT Treatments $Gait Training: 23-37 mins   PT G Codes:   PT G-Codes **NOT FOR INPATIENT CLASS** Functional Assessment Tool Used: clinical judgement Functional Limitation: Mobility: Walking and moving around Mobility: Walking and Moving Around Current Status (Y5110): At least 40 percent but less than 60 percent impaired, limited or restricted Mobility: Walking and Moving Around Goal Status [phone]): At least 1 percent but less than 20 percent impaired, limited or restricted    Zalayah Pizzuto M Brystal Kild[place] 01/03/2017, 8:30 AM   Kittie Plater, PT, DPT Pager #: [phone] Office #: [phone]

## 2017-01-03 NOTE — ED Notes (Signed)
MD at bedside. 

## 2017-01-03 NOTE — ED Notes (Signed)
Called lab to add-on magnesium. 

## 2017-01-03 NOTE — ED Provider Notes (Addendum)
[facility] DEPT Provider Note   CSN: [id number] Arrival date & time: 01/02/17  1307     History   Chief Complaint Chief Complaint  Patient presents with  . Leg Pain    HPI [name] is a 83 y.o. female.  HPI Patient presents to emergency department complaining of worsening right hip pain over the past week. She's had difficulty ambulating and has been barely getting around with a walker because of the severe pain in the right hip. No injury or trauma. No falls. She'll history of lung cancer diagnosed many years ago but never received treatment for as far she knows she does not have any bony metastatic spread. She does have diabetes and atrial fibrillation. She is compliant with all of her medications. No chest pain or shortness of breath. No new rash. Denies numbness or weakness of the right lower extremity.   Past Medical History:  Diagnosis Date  . CAD (coronary artery disease) 03/13/2012  . Diabetes mellitus   . Dysrhythmia    ATRIAL FIB  . HTN (hypertension) 03/13/2012  . Hypothyroid 03/13/2012  . Lung cancer ([place])   . Shortness of breath   . Stroke ([place])   . Syncope and collapse 04/02/2016  . Type II or unspecified type diabetes mellitus without mention of complication, not stated as uncontrolled 03/13/2012    Patient Active Problem List   Diagnosis Date Noted  . Atrial fibrillation with rapid ventricular response ([place]) 12/12/2016  . Atrial fibrillation with RVR ([place])   . Diabetes mellitus with complication ([place])   . Faintness   . Thrombocytopenia ([place]) 04/07/2016  . Anemia 04/07/2016  . SOB (shortness of breath) on exertion   . Syncope and collapse   . Syncope 04/02/2016  . Physical deconditioning 04/20/2015  . Dyspnea 04/17/2015  . SOB (shortness of breath) 04/17/2015  . Acute renal failure superimposed on stage 3 chronic kidney disease ([place]) 04/17/2015  . Hypokalemia 04/17/2015  . Atrial fibrillation ([facility]) CHA2DS2-VASc Score 7 03/13/2012  . HTN  (hypertension) 03/13/2012  . Hypothyroid 03/13/2012  . DM (diabetes mellitus), type 2, uncontrolled, with renal complications ([place]) [id number]/23/[id number]  . CAD (coronary artery disease) 03/13/2012  . Carcinoid tumor of lung   . Stroke [facility])     Past Surgical History:  Procedure Laterality Date  . ABDOMINAL HYSTERECTOMY    . BREAST SURGERY     benign tumors removed in 1980  . CARDIAC CATHETERIZATION    . CARDIOVERSION  04/20/2012   Procedure: CARDIOVERSION;  Surgeon: Laverda Page, MD;  Location: [facility];  Service: Cardiovascular;  Laterality: N/A;  . CARDIOVERSION N/A 05/10/2013   Procedure: CARDIOVERSION;  Surgeon: Laverda Page, MD;  Location: [facility];  Service: Cardiovascular;  Laterality: N/A;  . CHOLECYSTECTOMY    . HEMORRHOID SURGERY    . jaw replacement    . LEFT HEART CATHETERIZATION WITH CORONARY ANGIOGRAM N/A 04/20/2015   Procedure: LEFT HEART CATHETERIZATION WITH CORONARY ANGIOGRAM;  Surgeon: Adrian Prows, MD;  Location[facility] CATH LAB;  Service: Cardiovascular;  Laterality: N/A;  . tumors     back of head    OB History    No data available       Home Medications    Prior to Admission medications   Medication Sig Start Date End Date Taking? Authorizing Provider  acetaminophen (TYLENOL) 500 MG tablet Take 500 mg by mouth every 6 (six) hours as needed for moderate pain or headache.    Yes Historical Provider, MD  ALPRAZolam Duanne Moron)  0.25 MG tablet Take 1 tablet (0.25 mg total) by mouth at bedtime. 12/17/16  Yes Dixie Dials, MD  amiodarone (PACERONE) 200 MG tablet Take 1 tablet (200 mg total) by mouth daily. 12/18/16  Yes Dixie Dials, MD  Calcium Carb-Cholecalciferol (CALCIUM 600-D PO) Take 600 mg by mouth daily.   Yes Historical Provider, MD  colesevelam [facility]) 625 MG tablet Take 312.5-625 mg by mouth daily as needed (diarrhea).   Yes Historical Provider, MD  diltiazem (CARDIZEM CD) 240 MG 24 hr capsule Take 1 capsule (240 mg total) by mouth daily. 12/18/16  Yes Dixie Dials, MD  hydrOXYzine (ATARAX/VISTARIL) 25 MG tablet Take 1 tablet (25 mg total) by mouth every 8 (eight) hours as needed for itching. 12/17/16  Yes Dixie Dials, MD  Insulin Detemir (LEVEMIR) 100 UNIT/ML Pen Inject 20 Units into the skin daily at 10 pm. Patient taking differently: Inject 20 Units into the skin every morning.  12/17/16  Yes Dixie Dials, MD  levothyroxine (SYNTHROID, LEVOTHROID) 100 MCG tablet Take 1 tablet (100 mcg total) by mouth daily before breakfast. Patient taking differently: Take 150 mcg by mouth daily before breakfast.  12/17/16  Yes Dixie Dials, MD  meclizine (ANTIVERT) 25 MG tablet Take 25 mg by mouth 4 (four) times daily as needed for dizziness.   Yes Historical Provider, MD  metoprolol (LOPRESSOR) 50 MG tablet Take 50 mg by mouth 2 (two) times daily.   Yes Historical Provider, MD  pantoprazole (PROTONIX) 40 MG tablet Take 1 tablet (40 mg total) by mouth daily. 12/18/16  Yes Dixie Dials, MD  Rivaroxaban (XARELTO) 15 MG TABS tablet Take 15 mg by mouth daily with supper. 03/16/12  Yes Adrian Prows, MD  clobetasol cream (TEMOVATE) 0.94 % Apply 1 application topically 2 (two) times daily as needed (itching/ do not use on face).    Historical Provider, MD  mupirocin ointment (BACTROBAN) 2 % Apply 1 application topically daily as needed (itching).     Historical Provider, MD  pramoxine (SARNA SENSITIVE) 1 % LOTN Apply 1 application topically 3 (three) times daily as needed (itching).    Historical Provider, MD    Family History Family History  Problem Relation Age of Onset  . Breast cancer Mother   . Aneurysm Father     Likely thoracic aneurysm per patient  . Melanoma Brother   . Heart disease Sister   . Diabetes type II Sister     Social History Social History  Substance Use Topics  . Smoking status: Never Smoker  . Smokeless tobacco: Never Used  . Alcohol use No     Allergies   Codeine and Hydrocodone-acetaminophen   Review of Systems Review of  Systems  All other systems reviewed and are negative.    Physical Exam Updated Vital Signs BP 129/79   Pulse 110   Temp 97.5 F (36.4 C) (Oral)   Resp 19   Ht '5\' 3"'$  (1.6 m)   Wt 127 lb (57.6 kg)   SpO2 99%   BMI 22.50 kg/m   Physical Exam  Constitutional: She is oriented to person, place, and time. She appears well-developed and well-nourished.  HENT:  Head: Normocephalic.  Eyes: EOM are normal.  Neck: Normal range of motion.  Pulmonary/Chest: Effort normal.  Abdominal: She exhibits no distension.  Musculoskeletal:  Normal perfusion of the right lower extremity with continued PT and DP pulses. Range of motion of the right knee. Limited range of motion right hip secondary to pain. No obvious deformity of the  right lower extremity. No swelling of the right lower treatment as compared to left. No rash noted. Tenderness of the right inguinal region as well as the right gluteal region. No inguinal lymphadenopathy noted.  Neurological: She is alert and oriented to person, place, and time.  Psychiatric: She has a normal mood and affect.  Nursing note and vitals reviewed.    ED Treatments / Results  Labs (all labs ordered are listed, but only abnormal results are displayed) Labs Reviewed  CBC WITH DIFFERENTIAL/PLATELET - Abnormal; Notable for the following:       Result Value   HCT 33.9 (*)    Platelets 107 (*)    All other components within normal limits  COMPREHENSIVE METABOLIC PANEL - Abnormal; Notable for the following:    Potassium 3.0 (*)    Glucose, Bld 161 (*)    Creatinine, Ser 1.16 (*)    Calcium 8.7 (*)    Total Protein 6.3 (*)    AST 14 (*)    ALT 13 (*)    GFR calc non Af Amer 43 (*)    GFR calc Af Amer 50 (*)    All other components within normal limits    EKG  EKG Interpretation  Date/Time:  Saturday January 03 2017 00:47:26 EST Ventricular Rate:  104 PR Interval:    QRS Duration: 90 QT Interval:  397 QTC Calculation: 523 R Axis:   79 Text  Interpretation:  Atrial flutter with predominant 3:1 AV block Anteroseptal infarct, age indeterminate Prolonged QT interval No significant change was found Confirmed by Yunus Stoklosa  MD, Lennette Bihari ([id number]) on 01/03/2017 12:55:38 AM       Radiology Mr Hip Right Wo Contrast  Result Date: 01/02/2017 CLINICAL DATA:  Severe right hip pain x1 week.  No known injury. EXAM: MR OF THE RIGHT HIP WITHOUT CONTRAST TECHNIQUE: Multiplanar, multisequence MR imaging was performed. No intravenous contrast was administered. COMPARISON:  Same date radiographs of the pelvis and hip. FINDINGS: Bones: No acute fracture bone destruction. Hip joints are maintained bilaterally. No sacral insufficiency fracture. Pubic rami are intact. Articular cartilage and labrum Articular cartilage: Mild partial thickness thinning of the articular cartilage of the right hip. Labrum:  No labral tear. Joint or bursal effusion Joint effusion: Small right hip joint effusion. Trace left hip joint effusion. Bursae:  None Muscles and tendons Muscles and tendons: Intramuscular edema of the gluteus minimus, obturator externus, adductor brevis and magnus muscles consistent with muscle strain. No full-thickness tear. No intramuscular hematoma. Other findings Miscellaneous: Tarlov cyst of the sacrum. Lower lumbar degenerative disc disease from L3 through S1. Unremarkable bladder. No pelvic adenopathy. IMPRESSION: No acute fracture of the pelvis nor right hip. Muscle strain of the gluteus minimus, obturator externus and adductor muscles. Small right hip joint effusion. No labral tear. Partial thinning of right hip cartilage. Small Tarlov cyst of the sacrum. Electronically Signed   By: Ashley Royalty M.D.   On: 01/02/2017 22:07   Dg Hip Unilat W Or Wo Pelvis 2-3 Views Right  Result Date: 01/02/2017 CLINICAL DATA:  Initial evaluation for acute right hip pain. No injury. EXAM: DG HIP (WITH OR WITHOUT PELVIS) 2-3V RIGHT COMPARISON:  None available. FINDINGS: Bones are diffusely  osteopenic, somewhat limiting evaluation for subtle acute nondisplaced fractures. No acute fracture or dislocation. Femoral heads in normal alignment within the acetabula. Femoral head heights preserved. 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 Bony pelvis intact. SI joints approximated. Degenerative changes noted within the lower lumbar spine. No  acute soft tissue abnormality. IMPRESSION: 1. No acute osseous abnormality about the hips. 2. 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 3. Prominent degenerative changes within the lower lumbar spine. 4. Osteopenia. Electronically Signed   By: Jeannine Boga M.D.   On: 01/02/2017 19:30    Procedures Procedures (including critical care time)  Medications Ordered in ED Medications  morphine 4 MG/ML injection 4 mg (4 mg Intravenous Given 01/02/17 1737)  ondansetron (ZOFRAN) injection 4 mg (4 mg Intravenous Given 01/02/17 1927)     Initial Impression / Assessment and Plan / ED Course  I have reviewed the triage vital signs and the nursing notes.  Pertinent labs & imaging results that were available during my care of the patient were reviewed by me and considered in my medical decision making (see chart for details).  Clinical Course   Initial x-ray was negative. MRI of the right hip performed which demonstrates a small joint effusion of the right hip as well as what appears to be radiographic evidence of muscle strain. Please see MRI report for complete details. Despite IV pain medication the patient continues to have significant pain in her right hip and is unable to ambulate without significant assistance secondary severe pain. I have asked the patient admitted to hospital under observational status for ongoing pain management and pain control and physical therapy consultation.  Final Clinical Impressions(s) / ED Diagnoses   Final diagnoses:  Right hip pain    New Prescriptions New  Prescriptions   No medications on file     Jola Schmidt, MD 01/03/17 [place], MD 01/03/17 [phone]

## 2017-01-03 NOTE — Discharge Instructions (Signed)
Information on my medicine - XARELTO (Rivaroxaban)  This medication education was reviewed with me or my healthcare representative as part of my discharge preparation.  The pharmacist that spoke with me during my hospital stay was:  Einar Grad, [facility]  Why was Xarelto prescribed for you? Xarelto was prescribed for you to reduce the risk of a blood clot forming that can cause a stroke if you have a medical condition called atrial fibrillation (a type of irregular heartbeat).  What do you need to know about xarelto ? Take your Xarelto ONCE DAILY at the same time every day with your evening meal. If you have difficulty swallowing the tablet whole, you may crush it and mix in applesauce just prior to taking your dose.  Take Xarelto exactly as prescribed by your doctor and DO NOT stop taking Xarelto without talking to the doctor who prescribed the medication.  Stopping without other stroke prevention medication to take the place of Xarelto may increase your risk of developing a clot that causes a stroke.  Refill your prescription before you run out.  After discharge, you should have regular check-up appointments with your healthcare provider that is prescribing your Xarelto.  In the future your dose may need to be changed if your kidney function or weight changes by a significant amount.  What do you do if you miss a dose? If you are taking Xarelto ONCE DAILY and you miss a dose, take it as soon as you remember on the same day then continue your regularly scheduled once daily regimen the next day. Do not take two doses of Xarelto at the same time or on the same day.   Important Safety Information A possible side effect of Xarelto is bleeding. You should call your healthcare provider right away if you experience any of the following: ? Bleeding from an injury or your nose that does not stop. ? Unusual colored urine (red or dark brown) or unusual colored stools (red or  black). ? Unusual bruising for unknown reasons. ? A serious fall or if you hit your head (even if there is no bleeding).  Some medicines may interact with Xarelto and might increase your risk of bleeding while on Xarelto. To help avoid this, consult your healthcare provider or pharmacist prior to using any new prescription or non-prescription medications, including herbals, vitamins, non-steroidal anti-inflammatory drugs (NSAIDs) and supplements.  This website has more information on Xarelto: [email or web address]

## 2017-01-04 DIAGNOSIS — M25551 Pain in right hip: Secondary | ICD-10-CM | POA: Diagnosis not present

## 2017-01-04 LAB — GLUCOSE, CAPILLARY
GLUCOSE-CAPILLARY: 171 mg/dL — AB (ref 65–99)
Glucose-Capillary: 159 mg/dL — ABNORMAL HIGH (ref 65–99)
Glucose-Capillary: 78 mg/dL (ref 65–99)

## 2017-01-04 MED ORDER — TRAMADOL HCL 50 MG PO TABS: 50.0000 mg | ORAL_TABLET | Freq: Four times a day (QID) | ORAL | 0 refills | Status: DC | PRN

## 2017-01-04 NOTE — Care Management Note (Addendum)
Case Management Note  Patient Details  Name: [name] MRN: [id number] Date of Birth: February 10, 1934  Subjective/Objective:                  one-week history of right hip pain Action/Plan: Discharge planning Expected Discharge Date:  01/04/17               Expected Discharge Plan:  [facility]  In-House Referral:     Discharge planning Services  CM Consult  Post Acute Care Choice:  Home Health Choice offered to:  Patient  DME Arranged:  3-N-1 DME Agency:  [place] Arranged:  RN, PT [facility] Agency:  [facility]  Status of Service:  In process, will continue to follow  If discussed at Long Length of Stay Meetings, dates discussed:    Additional Comments: CM faxed orders and face to face to pt's choice of home health agency, [facility].   CM met with pt in room to confirm she is active with [facility]; pt confirms.  CM has requested resumption orders for HHRN/PT.  CM notified [facility] rep, Reggie to please deliver the 3n1 to room prior to discharge.   Dellie Catholic, RN 01/04/2017, 9:45 AM

## 2017-01-04 NOTE — Discharge Summary (Signed)
Physician Discharge Summary  [name] CBJ:[id number] DOB: 02/19/1934 DOA: 01/02/2017  PCP: Dwan Bolt, MD  Admit date: 01/02/2017 Discharge date: 01/04/2017  Time spent: > 35 minutes  Recommendations for Outpatient Follow-up:  1. Please continue to ensure pain control  Discharge Diagnoses:  Principal Problem:   Right hip pain Active Problems:   Atrial fibrillation (HCC) CHA2DS2-VASc Score 7   Hypokalemia   Thrombocytopenia (HCC)   Anemia   Diabetes mellitus with complication (HCC)   Muscle strain   Discharge Condition: stable  Diet recommendation: heart healthy  Filed Weights   01/02/17 1342 01/03/17 0202 01/03/17 2036  Weight: 57.6 kg (127 lb) 58.7 kg (129 lb 4.8 oz) 58.9 kg (129 lb 13.6 oz)    History of present illness:  83 y.o. female with medical history significant of HTN, afib, DM type II, CAD, lung Ca, and CVA; who presents with one-week history of right hip pain  Diagnosed with sprain after MRI  Hospital Course:  Muscle Sprain - rest, physical therapy, and tramadol on d/c for pain control. Pt Tolerated tramadol well.  Her medical problems listed above will continue home medication regimen listed below.  Procedures:  None  Consultations:  None  Discharge Exam: Vitals:   01/04/17 0604 01/04/17 0956  BP: 112/70 (!) 110/55  Pulse: 80 75  Resp: 18 18  Temp: 98.6 F (37 C) 97 F (36.1 C)    General: Patient in no acute distress, alert and awake Cardiovascular: Regular rate and rhythm, no murmurs or rubs Respiratory: No increased work of breathing or wheezes  Discharge Instructions   Discharge Instructions    Call MD for:  severe uncontrolled pain    Complete by:  As directed    Call MD for:  temperature >100.4    Complete by:  As directed    Diet - low sodium heart healthy    Complete by:  As directed    Increase activity slowly    Complete by:  As directed      Current Discharge Medication List    START taking these  medications   Details  traMADol (ULTRAM) 50 MG tablet Take 1-2 tablets (50-100 mg total) by mouth every 6 (six) hours as needed for moderate pain. Qty: 30 tablet, Refills: 0      CONTINUE these medications which have NOT CHANGED   Details  acetaminophen (TYLENOL) 500 MG tablet Take 500 mg by mouth every 6 (six) hours as needed for moderate pain or headache.     ALPRAZolam (XANAX) 0.25 MG tablet Take 1 tablet (0.25 mg total) by mouth at bedtime. Qty: 30 tablet, Refills: 3    amiodarone (PACERONE) 200 MG tablet Take 1 tablet (200 mg total) by mouth daily. Qty: 30 tablet, Refills: 1    Calcium Carb-Cholecalciferol (CALCIUM 600-D PO) Take 600 mg by mouth daily.    colesevelam (WELCHOL) 625 MG tablet Take 312.5-625 mg by mouth daily as needed (diarrhea).    diltiazem (CARDIZEM CD) 240 MG 24 hr capsule Take 1 capsule (240 mg total) by mouth daily. Qty: 30 capsule, Refills: 3    hydrOXYzine (ATARAX/VISTARIL) 25 MG tablet Take 1 tablet (25 mg total) by mouth every 8 (eight) hours as needed for itching. Qty: 90 tablet, Refills: 3    Insulin Detemir (LEVEMIR) 100 UNIT/ML Pen Inject 20 Units into the skin daily at 10 pm. Qty: 15 mL, Refills: 11    levothyroxine (SYNTHROID, LEVOTHROID) 100 MCG tablet Take 1 tablet (100 mcg total) by mouth daily  before breakfast. Qty: 30 tablet, Refills: 3    meclizine (ANTIVERT) 25 MG tablet Take 25 mg by mouth 4 (four) times daily as needed for dizziness.    metoprolol (LOPRESSOR) 50 MG tablet Take 50 mg by mouth 2 (two) times daily.    pantoprazole (PROTONIX) 40 MG tablet Take 1 tablet (40 mg total) by mouth daily. Qty: 30 tablet, Refills: 3    Rivaroxaban (XARELTO) 15 MG TABS tablet Take 15 mg by mouth daily with supper.    clobetasol cream (TEMOVATE) 4.70 % Apply 1 application topically 2 (two) times daily as needed (itching/ do not use on face).    mupirocin ointment (BACTROBAN) 2 % Apply 1 application topically daily as needed (itching).      pramoxine (SARNA SENSITIVE) 1 % LOTN Apply 1 application topically 3 (three) times daily as needed (itching).       Allergies  Allergen Reactions  . Codeine Nausea And Vomiting  . Hydrocodone-Acetaminophen Nausea And Vomiting   Follow-up Information    [facility] .   Specialty:  Home [facility] Services Contact information: [address] [place] [address] [phone]            The results of significant diagnostics from this hospitalization (including imaging, microbiology, ancillary and laboratory) are listed below for reference.    Significant Diagnostic Studies: Mr Hip Right Wo Contrast  Result Date: 01/02/2017 CLINICAL DATA:  Severe right hip pain x1 week.  No known injury. EXAM: MR OF THE RIGHT HIP WITHOUT CONTRAST TECHNIQUE: Multiplanar, multisequence MR imaging was performed. No intravenous contrast was administered. COMPARISON:  Same date radiographs of the pelvis and hip. FINDINGS: Bones: No acute fracture bone destruction. Hip joints are maintained bilaterally. No sacral insufficiency fracture. Pubic rami are intact. Articular cartilage and labrum Articular cartilage: Mild partial thickness thinning of the articular cartilage of the right hip. Labrum:  No labral tear. Joint or bursal effusion Joint effusion: Small right hip joint effusion. Trace left hip joint effusion. Bursae:  None Muscles and tendons Muscles and tendons: Intramuscular edema of the gluteus minimus, obturator externus, adductor brevis and magnus muscles consistent with muscle strain. No full-thickness tear. No intramuscular hematoma. Other findings Miscellaneous: Tarlov cyst of the sacrum. Lower lumbar degenerative disc disease from L3 through S1. Unremarkable bladder. No pelvic adenopathy. IMPRESSION: No acute fracture of the pelvis nor right hip. Muscle strain of the gluteus minimus, obturator externus and adductor muscles. Small right hip joint effusion. No labral tear.  Partial thinning of right hip cartilage. Small Tarlov cyst of the sacrum. Electronically Signed   By: Ashley Royalty M.D.   On: 01/02/2017 22:07   Dg Hip Unilat W Or Wo Pelvis 2-3 Views Right  Result Date: 01/02/2017 CLINICAL DATA:  Initial evaluation for acute right hip pain. No injury. EXAM: DG HIP (WITH OR WITHOUT PELVIS) 2-3V RIGHT COMPARISON:  None available. FINDINGS: Bones are diffusely osteopenic, somewhat limiting evaluation for subtle acute nondisplaced fractures. No acute fracture or dislocation. Femoral heads in normal alignment within the acetabula. Femoral head heights preserved. 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 Bony pelvis intact. SI joints approximated. Degenerative changes noted within the lower lumbar spine. No acute soft tissue abnormality. IMPRESSION: 1. No acute osseous abnormality about the hips. 2. 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 3. Prominent degenerative changes within the lower lumbar spine. 4. Osteopenia. Electronically Signed   By: Jeannine Boga M.D.   On: 01/02/2017 19:30  Microbiology: No results found for this or any previous visit (from the past 240 hour(s)).   Labs: Basic Metabolic Panel:  Recent Labs Lab 01/02/17 1745 01/03/17 0621  NA 138 140  K 3.0* 3.9  CL 103 106  CO2 23 24  GLUCOSE 161* 155*  BUN 13 14  CREATININE 1.16* 0.99  CALCIUM 8.7* 8.5*  MG 1.2*  --    Liver Function Tests:  Recent Labs Lab 01/02/17 1745  AST 14*  ALT 13*  ALKPHOS 73  BILITOT 1.0  PROT 6.3*  ALBUMIN 3.6   No results for input(s): LIPASE, AMYLASE in the last 168 hours. No results for input(s): AMMONIA in the last 168 hours. CBC:  Recent Labs Lab 01/02/17 1745 01/03/17 0621  WBC 5.3 4.7  NEUTROABS 2.6  --   HGB 12.1 11.4*  HCT 33.9* 33.0*  MCV 84.5 86.2  PLT 107* 101*   Cardiac Enzymes: No results for input(s): CKTOTAL, CKMB, CKMBINDEX, TROPONINI in the last 168  hours. BNP: BNP (last 3 results)  Recent Labs  04/03/16 0038 12/12/16 2048  BNP 204.8* 207.9*    ProBNP (last 3 results) No results for input(s): PROBNP in the last 8760 hours.  CBG:  Recent Labs Lab 01/03/17 1204 01/03/17 1741 01/03/17 2031 01/04/17 0754 01/04/17 1147  GLUCAP 198* 108* 209* 159* 171*    Signed:  Velvet Bathe MD.  [facility] 01/04/2017, 3:14 PM

## 2017-02-13 ENCOUNTER — Other Ambulatory Visit: Payer: Self-pay | Admitting: Endocrinology

## 2017-02-13 DIAGNOSIS — Z1231 Encounter for screening mammogram for malignant neoplasm of breast: Secondary | ICD-10-CM

## 2017-02-26 DIAGNOSIS — E789 Disorder of lipoprotein metabolism, unspecified: Secondary | ICD-10-CM | POA: Diagnosis not present

## 2017-02-26 DIAGNOSIS — I10 Essential (primary) hypertension: Secondary | ICD-10-CM | POA: Diagnosis not present

## 2017-02-26 DIAGNOSIS — E118 Type 2 diabetes mellitus with unspecified complications: Secondary | ICD-10-CM | POA: Diagnosis not present

## 2017-02-26 DIAGNOSIS — E0789 Other specified disorders of thyroid: Secondary | ICD-10-CM | POA: Diagnosis not present

## 2017-02-26 DIAGNOSIS — E538 Deficiency of other specified B group vitamins: Secondary | ICD-10-CM | POA: Diagnosis not present

## 2017-03-02 DIAGNOSIS — R532 Functional quadriplegia: Secondary | ICD-10-CM | POA: Diagnosis not present

## 2017-03-02 DIAGNOSIS — I482 Chronic atrial fibrillation: Secondary | ICD-10-CM | POA: Diagnosis not present

## 2017-03-03 ENCOUNTER — Ambulatory Visit
Admission: RE | Admit: 2017-03-03 | Discharge: 2017-03-03 | Disposition: A | Discharge status: Home / Self Care | Payer: Medicare Other | Source: Ambulatory Visit | Attending: Endocrinology | Admitting: Endocrinology

## 2017-03-03 DIAGNOSIS — Z1231 Encounter for screening mammogram for malignant neoplasm of breast: Secondary | ICD-10-CM

## 2017-03-05 DIAGNOSIS — E789 Disorder of lipoprotein metabolism, unspecified: Secondary | ICD-10-CM | POA: Diagnosis not present

## 2017-03-05 DIAGNOSIS — E032 Hypothyroidism due to medicaments and other exogenous substances: Secondary | ICD-10-CM | POA: Diagnosis not present

## 2017-03-05 DIAGNOSIS — E118 Type 2 diabetes mellitus with unspecified complications: Secondary | ICD-10-CM | POA: Diagnosis not present

## 2017-03-05 DIAGNOSIS — I4891 Unspecified atrial fibrillation: Secondary | ICD-10-CM | POA: Diagnosis not present

## 2017-03-25 DIAGNOSIS — L308 Other specified dermatitis: Secondary | ICD-10-CM | POA: Diagnosis not present

## 2017-04-02 DIAGNOSIS — I482 Chronic atrial fibrillation: Secondary | ICD-10-CM | POA: Diagnosis not present

## 2017-04-02 DIAGNOSIS — R532 Functional quadriplegia: Secondary | ICD-10-CM | POA: Diagnosis not present

## 2017-04-23 DIAGNOSIS — R0602 Shortness of breath: Secondary | ICD-10-CM | POA: Diagnosis not present

## 2017-04-23 DIAGNOSIS — I482 Chronic atrial fibrillation: Secondary | ICD-10-CM | POA: Diagnosis not present

## 2017-04-23 DIAGNOSIS — I2510 Atherosclerotic heart disease of native coronary artery without angina pectoris: Secondary | ICD-10-CM | POA: Diagnosis not present

## 2017-04-23 DIAGNOSIS — L299 Pruritus, unspecified: Secondary | ICD-10-CM | POA: Diagnosis not present

## 2017-05-02 DIAGNOSIS — R532 Functional quadriplegia: Secondary | ICD-10-CM | POA: Diagnosis not present

## 2017-05-02 DIAGNOSIS — I482 Chronic atrial fibrillation: Secondary | ICD-10-CM | POA: Diagnosis not present

## 2017-05-28 DIAGNOSIS — E789 Disorder of lipoprotein metabolism, unspecified: Secondary | ICD-10-CM | POA: Diagnosis not present

## 2017-05-28 DIAGNOSIS — E118 Type 2 diabetes mellitus with unspecified complications: Secondary | ICD-10-CM | POA: Diagnosis not present

## 2017-06-28 DIAGNOSIS — D649 Anemia, unspecified: Secondary | ICD-10-CM

## 2017-06-28 DIAGNOSIS — K5792 Diverticulitis of intestine, part unspecified, without perforation or abscess without bleeding: Secondary | ICD-10-CM

## 2017-06-28 DIAGNOSIS — N179 Acute kidney failure, unspecified: Secondary | ICD-10-CM

## 2017-06-28 HISTORY — DX: Diverticulitis of intestine, part unspecified, without perforation or abscess without bleeding: K57.92

## 2017-06-28 HISTORY — DX: Acute kidney failure, unspecified: N17.9

## 2017-06-28 HISTORY — DX: Anemia, unspecified: D64.9

## 2017-07-07 ENCOUNTER — Emergency Department (HOSPITAL_COMMUNITY): Payer: Medicare Other

## 2017-07-07 ENCOUNTER — Observation Stay (HOSPITAL_COMMUNITY)
Admission: EM | Admit: 2017-07-07 | Discharge: 2017-07-09 | Disposition: A | Discharge status: Home / Self Care | Payer: Medicare Other | Attending: Internal Medicine | Admitting: Internal Medicine

## 2017-07-07 ENCOUNTER — Encounter (HOSPITAL_COMMUNITY): Payer: Self-pay | Admitting: *Deleted

## 2017-07-07 DIAGNOSIS — I2510 Atherosclerotic heart disease of native coronary artery without angina pectoris: Secondary | ICD-10-CM | POA: Insufficient documentation

## 2017-07-07 DIAGNOSIS — K5792 Diverticulitis of intestine, part unspecified, without perforation or abscess without bleeding: Secondary | ICD-10-CM

## 2017-07-07 DIAGNOSIS — S0990XA Unspecified injury of head, initial encounter: Secondary | ICD-10-CM | POA: Diagnosis present

## 2017-07-07 DIAGNOSIS — D649 Anemia, unspecified: Secondary | ICD-10-CM | POA: Diagnosis not present

## 2017-07-07 DIAGNOSIS — I4581 Long QT syndrome: Secondary | ICD-10-CM | POA: Diagnosis not present

## 2017-07-07 DIAGNOSIS — M160 Bilateral primary osteoarthritis of hip: Secondary | ICD-10-CM | POA: Diagnosis not present

## 2017-07-07 DIAGNOSIS — Z90710 Acquired absence of both cervix and uterus: Secondary | ICD-10-CM | POA: Diagnosis not present

## 2017-07-07 DIAGNOSIS — I10 Essential (primary) hypertension: Secondary | ICD-10-CM | POA: Insufficient documentation

## 2017-07-07 DIAGNOSIS — R079 Chest pain, unspecified: Secondary | ICD-10-CM | POA: Diagnosis not present

## 2017-07-07 DIAGNOSIS — I700 Atherosclerosis of aorta: Secondary | ICD-10-CM | POA: Diagnosis not present

## 2017-07-07 DIAGNOSIS — I480 Paroxysmal atrial fibrillation: Secondary | ICD-10-CM | POA: Insufficient documentation

## 2017-07-07 DIAGNOSIS — I083 Combined rheumatic disorders of mitral, aortic and tricuspid valves: Secondary | ICD-10-CM | POA: Diagnosis not present

## 2017-07-07 DIAGNOSIS — Z8673 Personal history of transient ischemic attack (TIA), and cerebral infarction without residual deficits: Secondary | ICD-10-CM | POA: Insufficient documentation

## 2017-07-07 DIAGNOSIS — W182XXA Fall in (into) shower or empty bathtub, initial encounter: Secondary | ICD-10-CM | POA: Insufficient documentation

## 2017-07-07 DIAGNOSIS — Y93E1 Activity, personal bathing and showering: Secondary | ICD-10-CM | POA: Diagnosis not present

## 2017-07-07 DIAGNOSIS — Z85118 Personal history of other malignant neoplasm of bronchus and lung: Secondary | ICD-10-CM | POA: Insufficient documentation

## 2017-07-07 DIAGNOSIS — Z9049 Acquired absence of other specified parts of digestive tract: Secondary | ICD-10-CM | POA: Insufficient documentation

## 2017-07-07 DIAGNOSIS — Z794 Long term (current) use of insulin: Secondary | ICD-10-CM | POA: Insufficient documentation

## 2017-07-07 DIAGNOSIS — R55 Syncope and collapse: Principal | ICD-10-CM | POA: Insufficient documentation

## 2017-07-07 DIAGNOSIS — E119 Type 2 diabetes mellitus without complications: Secondary | ICD-10-CM | POA: Insufficient documentation

## 2017-07-07 DIAGNOSIS — R918 Other nonspecific abnormal finding of lung field: Secondary | ICD-10-CM | POA: Insufficient documentation

## 2017-07-07 DIAGNOSIS — I959 Hypotension, unspecified: Secondary | ICD-10-CM

## 2017-07-07 DIAGNOSIS — S0003XA Contusion of scalp, initial encounter: Secondary | ICD-10-CM | POA: Insufficient documentation

## 2017-07-07 DIAGNOSIS — Z7901 Long term (current) use of anticoagulants: Secondary | ICD-10-CM | POA: Diagnosis not present

## 2017-07-07 DIAGNOSIS — N179 Acute kidney failure, unspecified: Secondary | ICD-10-CM | POA: Diagnosis not present

## 2017-07-07 DIAGNOSIS — E039 Hypothyroidism, unspecified: Secondary | ICD-10-CM | POA: Diagnosis not present

## 2017-07-07 DIAGNOSIS — Z885 Allergy status to narcotic agent status: Secondary | ICD-10-CM | POA: Insufficient documentation

## 2017-07-07 DIAGNOSIS — K5732 Diverticulitis of large intestine without perforation or abscess without bleeding: Secondary | ICD-10-CM | POA: Diagnosis not present

## 2017-07-07 DIAGNOSIS — Z79899 Other long term (current) drug therapy: Secondary | ICD-10-CM | POA: Diagnosis not present

## 2017-07-07 DIAGNOSIS — W19XXXA Unspecified fall, initial encounter: Secondary | ICD-10-CM

## 2017-07-07 DIAGNOSIS — I4891 Unspecified atrial fibrillation: Secondary | ICD-10-CM | POA: Diagnosis present

## 2017-07-07 HISTORY — DX: Unspecified fall, initial encounter: W19.XXXA

## 2017-07-07 HISTORY — DX: Acute kidney failure, unspecified: N17.9

## 2017-07-07 HISTORY — DX: Anemia, unspecified: D64.9

## 2017-07-07 HISTORY — DX: Diverticulitis of intestine, part unspecified, without perforation or abscess without bleeding: K57.92

## 2017-07-07 MED ORDER — ACETAMINOPHEN 500 MG PO TABS
1000.0000 mg | ORAL_TABLET | Freq: Once | ORAL | Status: AC
  Administered 2017-07-07: 1000 mg via ORAL

## 2017-07-07 MED ORDER — ACETAMINOPHEN 500 MG PO TABS
ORAL_TABLET | ORAL | Status: AC
  Filled 2017-07-07: qty 2

## 2017-07-07 NOTE — ED Notes (Signed)
Call to CT to expedite pt being picked up for exam

## 2017-07-07 NOTE — ED Triage Notes (Addendum)
Pt is on xarelto and she slipped and fell in the bathtub and struck the back of her head.  Pt reports LOC "for a few minutes" and currently she has a hematoma, a HA which radiates into ear and eye.  Pt is alert and oriented at this time. EDP notified and CT ordered.  Pt also reports right hip pain from fall

## 2017-07-08 ENCOUNTER — Other Ambulatory Visit: Payer: Self-pay

## 2017-07-08 ENCOUNTER — Other Ambulatory Visit (HOSPITAL_COMMUNITY): Payer: Medicare Other

## 2017-07-08 ENCOUNTER — Emergency Department (HOSPITAL_COMMUNITY): Payer: Medicare Other

## 2017-07-08 ENCOUNTER — Encounter (HOSPITAL_COMMUNITY): Payer: Self-pay | Admitting: Internal Medicine

## 2017-07-08 DIAGNOSIS — N179 Acute kidney failure, unspecified: Secondary | ICD-10-CM

## 2017-07-08 DIAGNOSIS — R079 Chest pain, unspecified: Secondary | ICD-10-CM | POA: Diagnosis present

## 2017-07-08 DIAGNOSIS — K5792 Diverticulitis of intestine, part unspecified, without perforation or abscess without bleeding: Secondary | ICD-10-CM | POA: Diagnosis not present

## 2017-07-08 DIAGNOSIS — S0990XA Unspecified injury of head, initial encounter: Secondary | ICD-10-CM | POA: Insufficient documentation

## 2017-07-08 DIAGNOSIS — I482 Chronic atrial fibrillation: Secondary | ICD-10-CM | POA: Diagnosis not present

## 2017-07-08 LAB — CBC
HCT: 32 % — ABNORMAL LOW (ref 36.0–46.0)
HEMOGLOBIN: 10.6 g/dL — AB (ref 12.0–15.0)
MCH: 29.4 pg (ref 26.0–34.0)
MCHC: 33.1 g/dL (ref 30.0–36.0)
MCV: 88.6 fL (ref 78.0–100.0)
Platelets: 94 10*3/uL — ABNORMAL LOW (ref 150–400)
RBC: 3.61 MIL/uL — ABNORMAL LOW (ref 3.87–5.11)
RDW: 13.6 % (ref 11.5–15.5)
WBC: 4.4 10*3/uL (ref 4.0–10.5)

## 2017-07-08 LAB — URINALYSIS, ROUTINE W REFLEX MICROSCOPIC
BILIRUBIN URINE: NEGATIVE
Bacteria, UA: NONE SEEN
GLUCOSE, UA: NEGATIVE mg/dL
Hgb urine dipstick: NEGATIVE
KETONES UR: NEGATIVE mg/dL
Nitrite: NEGATIVE
PH: 5 (ref 5.0–8.0)
PROTEIN: NEGATIVE mg/dL
RBC / HPF: NONE SEEN RBC/hpf (ref 0–5)
Specific Gravity, Urine: 1.024 (ref 1.005–1.030)

## 2017-07-08 LAB — COMPREHENSIVE METABOLIC PANEL
ALBUMIN: 3.1 g/dL — AB (ref 3.5–5.0)
ALK PHOS: 72 U/L (ref 38–126)
ALK PHOS: 64 U/L (ref 38–126)
ALT: 12 U/L — AB (ref 14–54)
ALT: 11 U/L — ABNORMAL LOW (ref 14–54)
ANION GAP: 10 (ref 5–15)
ANION GAP: 8 (ref 5–15)
AST: 15 U/L (ref 15–41)
AST: 15 U/L (ref 15–41)
Albumin: 3.5 g/dL (ref 3.5–5.0)
BUN: 21 mg/dL — AB (ref 6–20)
BUN: 15 mg/dL (ref 6–20)
CALCIUM: 7.8 mg/dL — AB (ref 8.9–10.3)
CHLORIDE: 104 mmol/L (ref 101–111)
CO2: 22 mmol/L (ref 22–32)
CO2: 23 mmol/L (ref 22–32)
Calcium: 8.4 mg/dL — ABNORMAL LOW (ref 8.9–10.3)
Chloride: 108 mmol/L (ref 101–111)
Creatinine, Ser: 1.49 mg/dL — ABNORMAL HIGH (ref 0.44–1.00)
Creatinine, Ser: 1.21 mg/dL — ABNORMAL HIGH (ref 0.44–1.00)
GFR calc Af Amer: 37 mL/min — ABNORMAL LOW (ref >60)
GFR calc Af Amer: 48 mL/min — ABNORMAL LOW (ref >60)
GFR calc non Af Amer: 32 mL/min — ABNORMAL LOW (ref >60)
GFR calc non Af Amer: 41 mL/min — ABNORMAL LOW (ref >60)
GLUCOSE: 211 mg/dL — AB (ref 65–99)
GLUCOSE: 179 mg/dL — AB (ref 65–99)
POTASSIUM: 3.8 mmol/L (ref 3.5–5.1)
Potassium: 3.7 mmol/L (ref 3.5–5.1)
SODIUM: 139 mmol/L (ref 135–145)
Sodium: 136 mmol/L (ref 135–145)
TOTAL PROTEIN: 5.7 g/dL — AB (ref 6.5–8.1)
Total Bilirubin: 0.7 mg/dL (ref 0.3–1.2)
Total Bilirubin: 0.7 mg/dL (ref 0.3–1.2)
Total Protein: 5.4 g/dL — ABNORMAL LOW (ref 6.5–8.1)

## 2017-07-08 LAB — GLUCOSE, CAPILLARY
GLUCOSE-CAPILLARY: 187 mg/dL — AB (ref 65–99)
GLUCOSE-CAPILLARY: 227 mg/dL — AB (ref 65–99)
GLUCOSE-CAPILLARY: 161 mg/dL — AB (ref 65–99)
Glucose-Capillary: 245 mg/dL — ABNORMAL HIGH (ref 65–99)

## 2017-07-08 LAB — CBC WITH DIFFERENTIAL/PLATELET
Basophils Absolute: 0 10*3/uL (ref 0.0–0.1)
Basophils Relative: 1 %
Eosinophils Absolute: 0.1 10*3/uL (ref 0.0–0.7)
Eosinophils Relative: 1 %
HCT: 33.5 % — ABNORMAL LOW (ref 36.0–46.0)
HEMOGLOBIN: 11.3 g/dL — AB (ref 12.0–15.0)
LYMPHS ABS: 2.5 10*3/uL (ref 0.7–4.0)
LYMPHS PCT: 43 %
MCH: 29.7 pg (ref 26.0–34.0)
MCHC: 33.7 g/dL (ref 30.0–36.0)
MCV: 88.2 fL (ref 78.0–100.0)
Monocytes Absolute: 0.7 10*3/uL (ref 0.1–1.0)
Monocytes Relative: 11 %
NEUTROS PCT: 44 %
Neutro Abs: 2.6 10*3/uL (ref 1.7–7.7)
Platelets: 104 10*3/uL — ABNORMAL LOW (ref 150–400)
RBC: 3.8 MIL/uL — AB (ref 3.87–5.11)
RDW: 13.7 % (ref 11.5–15.5)
WBC: 5.9 10*3/uL (ref 4.0–10.5)

## 2017-07-08 LAB — I-STAT TROPONIN, ED: Troponin i, poc: 0.01 ng/mL (ref 0.00–0.08)

## 2017-07-08 LAB — TROPONIN I
Troponin I: <0.03 ng/mL (ref <0.03)
Troponin I: <0.03 ng/mL (ref <0.03)

## 2017-07-08 MED ORDER — DILTIAZEM HCL ER COATED BEADS 240 MG PO CP24
240.0000 mg | ORAL_CAPSULE | Freq: Every day | ORAL | Status: DC
  Administered 2017-07-08 – 2017-07-09 (×2): 240 mg via ORAL
  Filled 2017-07-08 (×2): qty 1

## 2017-07-08 MED ORDER — HYDROXYZINE HCL 25 MG PO TABS: 25.0000 mg | ORAL_TABLET | Freq: Three times a day (TID) | ORAL | Status: DC | PRN

## 2017-07-08 MED ORDER — RIVAROXABAN 15 MG PO TABS
15.0000 mg | ORAL_TABLET | Freq: Every day | ORAL | Status: DC
  Administered 2017-07-08: 15 mg via ORAL
  Filled 2017-07-08: qty 1

## 2017-07-08 MED ORDER — ONDANSETRON HCL 4 MG/2ML IJ SOLN
4.0000 mg | Freq: Once | INTRAMUSCULAR | Status: AC
  Administered 2017-07-08: 4 mg via INTRAVENOUS
  Filled 2017-07-08: qty 2

## 2017-07-08 MED ORDER — CIPROFLOXACIN IN D5W 400 MG/200ML IV SOLN: 400.0000 mg | Freq: Two times a day (BID) | INTRAVENOUS | Status: DC

## 2017-07-08 MED ORDER — IOPAMIDOL (ISOVUE-300) INJECTION 61%
INTRAVENOUS | Status: AC
  Administered 2017-07-08: 75 mL
  Filled 2017-07-08: qty 75

## 2017-07-08 MED ORDER — MECLIZINE HCL 25 MG PO TABS: 25.0000 mg | ORAL_TABLET | Freq: Four times a day (QID) | ORAL | Status: DC | PRN

## 2017-07-08 MED ORDER — COLESEVELAM HCL 625 MG PO TABS
312.5000 mg | ORAL_TABLET | Freq: Every day | ORAL | Status: DC | PRN
Start: 2017-07-08 — End: 2017-07-08

## 2017-07-08 MED ORDER — PANTOPRAZOLE SODIUM 40 MG PO TBEC
40.0000 mg | DELAYED_RELEASE_TABLET | Freq: Every day | ORAL | Status: DC
  Administered 2017-07-08 – 2017-07-09 (×2): 40 mg via ORAL
  Filled 2017-07-08 (×3): qty 1

## 2017-07-08 MED ORDER — INSULIN ASPART 100 UNIT/ML ~~LOC~~ SOLN
0.0000 [IU] | Freq: Three times a day (TID) | SUBCUTANEOUS | Status: DC
  Administered 2017-07-08: 2 [IU] via SUBCUTANEOUS
  Administered 2017-07-08: 3 [IU] via SUBCUTANEOUS
  Administered 2017-07-09 (×2): 2 [IU] via SUBCUTANEOUS

## 2017-07-08 MED ORDER — LEVOTHYROXINE SODIUM 75 MCG PO TABS
150.0000 ug | ORAL_TABLET | Freq: Every day | ORAL | Status: DC
  Administered 2017-07-08 – 2017-07-09 (×2): 150 ug via ORAL
  Filled 2017-07-08 (×3): qty 2

## 2017-07-08 MED ORDER — SODIUM CHLORIDE 0.9 % IV SOLN: INTRAVENOUS | Status: AC

## 2017-07-08 MED ORDER — SODIUM CHLORIDE 0.9% FLUSH
3.0000 mL | Freq: Two times a day (BID) | INTRAVENOUS | Status: DC
  Administered 2017-07-09 (×2): 3 mL via INTRAVENOUS

## 2017-07-08 MED ORDER — AMIODARONE HCL 200 MG PO TABS
200.0000 mg | ORAL_TABLET | Freq: Every day | ORAL | Status: DC
  Administered 2017-07-08 – 2017-07-09 (×2): 200 mg via ORAL
  Filled 2017-07-08 (×3): qty 1

## 2017-07-08 MED ORDER — ACETAMINOPHEN 650 MG RE SUPP: 650.0000 mg | Freq: Four times a day (QID) | RECTAL | Status: DC | PRN

## 2017-07-08 MED ORDER — TRAMADOL HCL 50 MG PO TABS: 50.0000 mg | ORAL_TABLET | Freq: Four times a day (QID) | ORAL | Status: DC | PRN

## 2017-07-08 MED ORDER — METRONIDAZOLE IN NACL 5-0.79 MG/ML-% IV SOLN
500.0000 mg | Freq: Once | INTRAVENOUS | Status: AC
  Administered 2017-07-08: 500 mg via INTRAVENOUS
  Filled 2017-07-08: qty 100

## 2017-07-08 MED ORDER — METRONIDAZOLE IN NACL 5-0.79 MG/ML-% IV SOLN
500.0000 mg | Freq: Three times a day (TID) | INTRAVENOUS | Status: DC
  Administered 2017-07-08 – 2017-07-09 (×4): 500 mg via INTRAVENOUS
  Filled 2017-07-08 (×4): qty 100

## 2017-07-08 MED ORDER — INSULIN ASPART 100 UNIT/ML ~~LOC~~ SOLN
0.0000 [IU] | Freq: Every day | SUBCUTANEOUS | Status: DC
  Administered 2017-07-08: 2 [IU] via SUBCUTANEOUS

## 2017-07-08 MED ORDER — CIPROFLOXACIN IN D5W 400 MG/200ML IV SOLN
400.0000 mg | INTRAVENOUS | Status: DC
  Administered 2017-07-08: 400 mg via INTRAVENOUS
  Filled 2017-07-08: qty 200

## 2017-07-08 MED ORDER — ALPRAZOLAM 0.25 MG PO TABS
0.2500 mg | ORAL_TABLET | Freq: Every day | ORAL | Status: DC
  Filled 2017-07-08: qty 1

## 2017-07-08 MED ORDER — INSULIN DETEMIR 100 UNIT/ML FLEXPEN: 20.0000 [IU] | PEN_INJECTOR | SUBCUTANEOUS | Status: DC

## 2017-07-08 MED ORDER — ACETAMINOPHEN 500 MG PO TABS: 500.0000 mg | ORAL_TABLET | Freq: Four times a day (QID) | ORAL | Status: DC | PRN

## 2017-07-08 MED ORDER — ACETAMINOPHEN 325 MG PO TABS
650.0000 mg | ORAL_TABLET | Freq: Four times a day (QID) | ORAL | Status: DC | PRN
  Administered 2017-07-08 – 2017-07-09 (×3): 650 mg via ORAL
  Filled 2017-07-08 (×3): qty 2

## 2017-07-08 MED ORDER — SODIUM CHLORIDE 0.9 % IV SOLN
INTRAVENOUS | Status: DC
  Administered 2017-07-08: 04:00:00 via INTRAVENOUS

## 2017-07-08 MED ORDER — CIPROFLOXACIN IN D5W 400 MG/200ML IV SOLN
400.0000 mg | Freq: Once | INTRAVENOUS | Status: AC
  Administered 2017-07-08: 400 mg via INTRAVENOUS
  Filled 2017-07-08: qty 200

## 2017-07-08 MED ORDER — SODIUM CHLORIDE 0.9 % IV BOLUS (SEPSIS)
1000.0000 mL | Freq: Once | INTRAVENOUS | Status: AC
  Administered 2017-07-08: 1000 mL via INTRAVENOUS

## 2017-07-08 MED ORDER — METOPROLOL TARTRATE 50 MG PO TABS
50.0000 mg | ORAL_TABLET | Freq: Two times a day (BID) | ORAL | Status: DC
  Administered 2017-07-08 – 2017-07-09 (×2): 50 mg via ORAL
  Filled 2017-07-08 (×2): qty 1

## 2017-07-08 MED ORDER — FENTANYL CITRATE (PF) 100 MCG/2ML IJ SOLN
25.0000 ug | Freq: Once | INTRAMUSCULAR | Status: AC
  Administered 2017-07-08: 25 ug via INTRAVENOUS
  Filled 2017-07-08: qty 2

## 2017-07-08 NOTE — Progress Notes (Signed)
ANTICOAGULATION CONSULT NOTE - Initial Consult  Pharmacy Consult for Xarelto Indication: atrial fibrillation  Allergies  Allergen Reactions  . Codeine Nausea And Vomiting  . Hydrocodone-Acetaminophen Nausea And Vomiting    Patient Measurements: Weight: 128 lb (58.1 kg)  Assessment: 83 yo F presents on 7/10 after fall. Was on Xarelto PTA for Afib. Now to restart. SCr 1.49. Hgb 11.3, plts 104.   Goal of Therapy:  Monitor platelets by anticoagulation protocol: Yes   Plan:  Restart Xarelto 15mg  PO daily Monitor CBC, s/s of bleed   Adelyn Roscher J 07/08/2017,5:53 AM

## 2017-07-08 NOTE — Care Management Obs Status (Signed)
[place] NOTIFICATION   Patient Details  Name: [name] MRN: [id number] Date of Birth: 09/08/1934   Medicare Observation Status Notification Given:  Yes    Dawayne Patricia, RN 07/08/2017, 4:30 PM

## 2017-07-08 NOTE — ED Notes (Signed)
Patient transported to CT 

## 2017-07-08 NOTE — Progress Notes (Signed)
Pt arrived from ED, alert ox3, VSS, oriented to unit and routines, safety and bed alarm discussed, verbalized understanding

## 2017-07-08 NOTE — Discharge Instructions (Addendum)
Follow with Tamsen Roers, MD in 2-3 weeks  Please get a complete blood count and chemistry panel checked by your Primary MD at your next visit, and again as instructed by your Primary MD. Please get your medications reviewed and adjusted by your Primary MD.  Please request your Primary MD to go over all Hospital Tests and Procedure/Radiological results at the follow up, please get all Hospital records sent to your Prim MD by signing hospital release before you go home.  If you had Pneumonia of Lung problems at the Hospital: Please get a 2 view Chest X ray done in 6-8 weeks after hospital discharge or sooner if instructed by your Primary MD.  If you have Congestive Heart Failure: Please call your Cardiologist or Primary MD anytime you have any of the following symptoms:  1) 3 pound weight gain in 24 hours or 5 pounds in 1 week  2) shortness of breath, with or without a dry hacking cough  3) swelling in the hands, feet or stomach  4) if you have to sleep on extra pillows at night in order to breathe  Follow cardiac low salt diet and 1.5 lit/day fluid restriction.  If you have diabetes Accuchecks 4 times/day, Once in AM empty stomach and then before each meal. Log in all results and show them to your primary doctor at your next visit. If any glucose reading is under 80 or above 300 call your primary MD immediately.  If you have Seizure/Convulsions/Epilepsy: Please do not drive, operate heavy machinery, participate in activities at heights or participate in high speed sports until you have seen by Primary MD or a Neurologist and advised to do so again.  If you had Gastrointestinal Bleeding: Please ask your Primary MD to check a complete blood count within one week of discharge or at your next visit. Your endoscopic/colonoscopic biopsies that are pending at the time of discharge, will also need to followed by your Primary MD.  Get Medicines reviewed and adjusted. Please take all your  medications with you for your next visit with your Primary MD  Please request your Primary MD to go over all hospital tests and procedure/radiological results at the follow up, please ask your Primary MD to get all Hospital records sent to his/her office.  If you experience worsening of your admission symptoms, develop shortness of breath, life threatening emergency, suicidal or homicidal thoughts you must seek medical attention immediately by calling 911 or calling your MD immediately  if symptoms less severe.  You must read complete instructions/literature along with all the possible adverse reactions/side effects for all the Medicines you take and that have been prescribed to you. Take any new Medicines after you have completely understood and accpet all the possible adverse reactions/side effects.   Do not drive or operate heavy machinery when taking Pain medications.   Do not take more than prescribed Pain, Sleep and Anxiety Medications  Special Instructions: If you have smoked or chewed Tobacco  in the last 2 yrs please stop smoking, stop any regular Alcohol  and or any Recreational drug use.  Wear Seat belts while driving.  Please note You were cared for by a hospitalist during your hospital stay. If you have any questions about your discharge medications or the care you received while you were in the hospital after you are discharged, you can call the unit and asked to speak with the hospitalist on call if the hospitalist that took care of you is not available. Once  you are discharged, your primary care physician will handle any further medical issues. Please note that NO REFILLS for any discharge medications will be authorized once you are discharged, as it is imperative that you return to your primary care physician (or establish a relationship with a primary care physician if you do not have one) for your aftercare needs so that they can reassess your need for medications and monitor your  lab values.  You can reach the hospitalist office at phone [phone] or fax [phone]   If you do not have a primary care physician, you can call [phone] for a physician referral.  Activity: As tolerated with Full fall precautions use walker/cane & assistance as needed  Diet: regular  Disposition Home     Information on my medicine - XARELTO (Rivaroxaban)  This medication education was reviewed with me or my healthcare representative as part of my discharge preparation.  The pharmacist that spoke with me during my hospital stay was:  Leroy Libman, [facility]  Why was Xarelto prescribed for you? Xarelto was prescribed for you to reduce the risk of a blood clot forming that can cause a stroke if you have a medical condition called atrial fibrillation (a type of irregular heartbeat).  What do you need to know about xarelto ? Take your Xarelto ONCE DAILY at the same time every day with your evening meal. If you have difficulty swallowing the tablet whole, you may crush it and mix in applesauce just prior to taking your dose.  Take Xarelto exactly as prescribed by your doctor and DO NOT stop taking Xarelto without talking to the doctor who prescribed the medication.  Stopping without other stroke prevention medication to take the place of Xarelto may increase your risk of developing a clot that causes a stroke.  Refill your prescription before you run out.  After discharge, you should have regular check-up appointments with your healthcare provider that is prescribing your Xarelto.  In the future your dose may need to be changed if your kidney function or weight changes by a significant amount.  What do you do if you miss a dose? If you are taking Xarelto ONCE DAILY and you miss a dose, take it as soon as you remember on the same day then continue your regularly scheduled once daily regimen the next day. Do not take two doses of Xarelto at the same time or on the same day.    Important Safety Information A possible side effect of Xarelto is bleeding. You should call your healthcare provider right away if you experience any of the following: ? Bleeding from an injury or your nose that does not stop. ? Unusual colored urine (red or dark brown) or unusual colored stools (red or black). ? Unusual bruising for unknown reasons. ? A serious fall or if you hit your head (even if there is no bleeding).  Some medicines may interact with Xarelto and might increase your risk of bleeding while on Xarelto. To help avoid this, consult your healthcare provider or pharmacist prior to using any new prescription or non-prescription medications, including herbals, vitamins, non-steroidal anti-inflammatory drugs (NSAIDs) and supplements.  This website has more information on Xarelto: [email or web address]-benson.[email or web address]/.   Diverticulitis Diverticulitis is when small pockets in your large intestine (colon) get infected or swollen. This causes stomach pain and watery poop (diarrhea).  These pouches are called diverticula. They form in people who have a condition called diverticulosis. Follow these instructions at home: Medicines  Take over-the-counter and prescription  medicines only as told by your doctor. These include: ? Antibiotics. ? Pain medicines. ? Fiber pills. ? Probiotics. ? Stool softeners.  Do not drive or use heavy machinery while taking prescription pain medicine.  If you were prescribed an antibiotic, take it as told. Do not stop taking it even if you feel better. General instructions  Follow a diet as told by your doctor.  When you feel better, your doctor may tell you to change your diet. You may need to eat a lot of fiber. Fiber makes it easier to poop (have bowel movements). Healthy foods with fiber include: ? Berries. ? Beans. ? Lentils. ? Green vegetables.  Exercise 3 or more times a week. Aim for 30 minutes each time. Exercise enough to sweat and make your heart  beat faster.    Keep all follow-up visits as told. This is important. You may need to have an exam of the large intestine. This is called a colonoscopy. Contact a doctor if:  Your pain does not get better.  You have a hard time eating or drinking.  You are not pooping like normal. Get help right away if:  Your pain gets worse.  Your problems do not get better.  Your problems get worse very fast.  You have a fever.  You throw up (vomit) more than one time.  You have poop that is: ? Bloody. ? Black. ? Tarry. Summary  Diverticulitis is when small pockets in your large intestine (colon) get infected or swollen.  Take medicines only as told by your doctor.  Follow a diet as told by your doctor. This information is not intended to replace advice given to you by your health care provider. Make sure you discuss any questions you have with your health care provider. Document Released: 06/02/2008 Document Revised: 01/01/2017 Document Reviewed: 01/01/2017 Elsevier Interactive Patient Education  2017 [employer].

## 2017-07-08 NOTE — H&P (Addendum)
TRH H&P   Patient Demographics:    [name], is a 83 y.o. female  MRN: [id number]   DOB - 06/05/1934  Admit Date - 07/07/2017  Outpatient Primary MD for the patient is Anda Kraft, MD  Referring MD/NP/PA:  Wyona Almas  Outpatient Specialists:   Patient coming from: home  Chief Complaint  Patient presents with  . Fall    xarelto, struck head      HPI:    [name]  is a 83 y.o. female, w Diabetes , Pafib (chads2vasc=) , CAD apparently fell in the bathtub.  Her leg just gave out under her,  She grabbed the bar, but it didn't help.  Hit left side of head.  Blacked out for a few minutes.  Had to crawl out of the tub.  Stomach and side hurt.    In ED,  CT brain 7/11=> negative, CT chest/abd pelvis=> mild focal wall thickening suspicious for acute diverticulitis, no perforation or abscess.  10 mm exophytic indeterminate left renal lesion.  Pt will be admitted for chest pain, lower left rib,  likely musculoskeletal and diverticulitis.      Review of systems:    In addition to the HPI above,  No Fever-chills, No changes with Vision or hearing, No problems swallowing food or Liquids, No Cough or Shortness of Breath,  No Nausea or Vommitting, Bowel movements are regular, No Blood in stool or Urine, No dysuria, No new skin rashes or bruises, No new joints pains-aches,  No new weakness, tingling, numbness in any extremity, No recent weight gain or loss, No polyuria, polydypsia or polyphagia, No significant Mental Stressors.  A full 10 point Review of Systems was done, except as stated above, all other Review of Systems were negative.   With Past History of the following :    Past Medical History:  Diagnosis Date  . CAD (coronary artery disease) 03/13/2012  . Diabetes mellitus   . Dysrhythmia    ATRIAL FIB  . HTN (hypertension) 03/13/2012  . Hypothyroid  03/13/2012  . Lung cancer ([place])   . Shortness of breath   . Stroke ([place])   . Syncope and collapse 04/02/2016  . Type II or unspecified type diabetes mellitus without mention of complication, not stated as uncontrolled 03/13/2012      Past Surgical History:  Procedure Laterality Date  . ABDOMINAL HYSTERECTOMY    . BREAST SURGERY     benign tumors removed in 1980  . CARDIAC CATHETERIZATION    . CARDIOVERSION  04/20/2012   Procedure: CARDIOVERSION;  Surgeon: Laverda Page, MD;  Location: [place];  Service: Cardiovascular;  Laterality: N/A;  . CARDIOVERSION N/A 05/10/2013   Procedure: CARDIOVERSION;  Surgeon: Laverda Page, MD;  Location: [place];  Service: Cardiovascular;  Laterality: N/A;  . CHOLECYSTECTOMY    . HEMORRHOID SURGERY    . jaw replacement    .  LEFT HEART CATHETERIZATION WITH CORONARY ANGIOGRAM N/A 04/20/2015   Procedure: LEFT HEART CATHETERIZATION WITH CORONARY ANGIOGRAM;  Surgeon: Adrian Prows, MD;  Location: [facility] CATH LAB;  Service: Cardiovascular;  Laterality: N/A;  . tumors     back of head      Social History:     Social History  Substance Use Topics  . Smoking status: Never Smoker  . Smokeless tobacco: Never Used  . Alcohol use No     Lives - at home w husband  Mobility - walks by self   Family History :     Family History  Problem Relation Age of Onset  . Breast cancer Mother   . Aneurysm Father        Likely thoracic aneurysm per patient  . Melanoma Brother   . Heart disease Sister   . Diabetes type II Sister       Home Medications:   Prior to Admission medications   Medication Sig Start Date End Date Taking? Authorizing Provider  acetaminophen (TYLENOL) 500 MG tablet Take 500 mg by mouth every 6 (six) hours as needed for moderate pain or headache.    Yes [provider]  ALPRAZolam (XANAX) 0.25 MG tablet Take 1 tablet (0.25 mg total) by mouth at bedtime. 12/17/16  Yes Dixie Dials, MD  amiodarone (PACERONE) 200 MG tablet Take  1 tablet (200 mg total) by mouth daily. 12/18/16  Yes Dixie Dials, MD  Calcium Carb-Cholecalciferol (CALCIUM 600-D PO) Take 600 mg by mouth daily.   Yes [provider]  clobetasol cream (TEMOVATE) 0.34 % Apply 1 application topically 2 (two) times daily as needed (itching/ do not use on face).   Yes [provider]  colesevelam (WELCHOL) 625 MG tablet Take 312.5-625 mg by mouth daily as needed (diarrhea).   Yes [provider]  levothyroxine (SYNTHROID, LEVOTHROID) 100 MCG tablet Take 1 tablet (100 mcg total) by mouth daily before breakfast. Patient taking differently: Take 150 mcg by mouth daily before breakfast.  12/17/16  Yes Dixie Dials, MD  meclizine (ANTIVERT) 25 MG tablet Take 25 mg by mouth 4 (four) times daily as needed for dizziness.   Yes [provider]  Rivaroxaban (XARELTO) 15 MG TABS tablet Take 15 mg by mouth daily with supper. 03/16/12  Yes Adrian Prows, MD  traMADol (ULTRAM) 50 MG tablet Take 1-2 tablets (50-100 mg total) by mouth every 6 (six) hours as needed for moderate pain. 01/04/17  Yes Velvet Bathe, MD  diltiazem (CARDIZEM CD) 240 MG 24 hr capsule Take 1 capsule (240 mg total) by mouth daily. 12/18/16   Dixie Dials, MD  hydrOXYzine (ATARAX/VISTARIL) 25 MG tablet Take 1 tablet (25 mg total) by mouth every 8 (eight) hours as needed for itching. 12/17/16   Dixie Dials, MD  Insulin Detemir (LEVEMIR) 100 UNIT/ML Pen Inject 20 Units into the skin daily at 10 pm. Patient taking differently: Inject 20 Units into the skin every morning.  12/17/16   Dixie Dials, MD  metoprolol (LOPRESSOR) 50 MG tablet Take 50 mg by mouth 2 (two) times daily.    [provider]  mupirocin ointment (BACTROBAN) 2 % Apply 1 application topically daily as needed (itching).     [provider]  pantoprazole (PROTONIX) 40 MG tablet Take 1 tablet (40 mg total) by mouth daily. 12/18/16   Dixie Dials, MD  pramoxine (SARNA SENSITIVE) 1 % LOTN Apply 1  application topically 3 (three) times daily as needed (itching).    [provider]  Allergies:     Allergies  Allergen Reactions  . Codeine Nausea And Vomiting  . Hydrocodone-Acetaminophen Nausea And Vomiting     Physical Exam:   Vitals  Blood pressure 120/65, pulse 91, temperature 97.8 F (36.6 C), temperature source Oral, resp. rate 14, weight 58.1 kg (128 lb), SpO2 98 %.   1. General  lying in bed in NAD,    2. Normal affect and insight, Not Suicidal or Homicidal, Awake Alert, Oriented X 3.  3. No F.N deficits, ALL C.Nerves Intact, Strength 5/5 all 4 extremities, Sensation intact all 4 extremities, Plantars down going.  4. Ears and Eyes appear Normal, Conjunctivae clear, PERRLA. Moist Oral Mucosa.  5. Supple Neck, No JVD, No cervical lymphadenopathy appriciated, No Carotid Bruits.  6. Symmetrical Chest wall movement, Good air movement bilaterally, CTAB.  7.irr, irr, s1, s2 , No Parasternal Heave.  8. Positive Bowel Sounds, Abdomen Soft, No tenderness, No organomegaly appriciated,No rebound -guarding or rigidity.  9.  No Cyanosis, Normal Skin Turgor, No Skin Rash or Bruise.  10. Good muscle tone,  joints appear normal , no effusions, Normal ROM.  11. No Palpable Lymph Nodes in Neck or Axillae 3 cm hematoma back of left head, , tenderness over the left lower ribs.     Data Review:    CBC  Recent Labs Lab 07/08/17 0127  WBC 5.9  HGB 11.3*  HCT 33.5*  PLT 104*  MCV 88.2  MCH 29.7  MCHC 33.7  RDW 13.7  LYMPHSABS 2.5  MONOABS 0.7  EOSABS 0.1  BASOSABS 0.0   ------------------------------------------------------------------------------------------------------------------  Chemistries   Recent Labs Lab 07/08/17 0127  NA 136  K 3.8  CL 104  CO2 22  GLUCOSE 211*  BUN 21*  CREATININE 1.49*  CALCIUM 8.4*  AST 15  ALT 12*  ALKPHOS 72  BILITOT 0.7    ------------------------------------------------------------------------------------------------------------------ CrCl cannot be calculated (Unknown ideal weight.). ------------------------------------------------------------------------------------------------------------------ No results for input(s): TSH, T4TOTAL, T3FREE, THYROIDAB in the last 72 hours.  Invalid input(s): FREET3  Coagulation profile No results for input(s): INR, PROTIME in the last 168 hours. ------------------------------------------------------------------------------------------------------------------- No results for input(s): DDIMER in the last 72 hours. -------------------------------------------------------------------------------------------------------------------  Cardiac Enzymes No results for input(s): CKMB, TROPONINI, MYOGLOBIN in the last 168 hours.  Invalid input(s): CK ------------------------------------------------------------------------------------------------------------------    Component Value Date/Time   BNP 207.9 (H) 12/12/2016 2048     ---------------------------------------------------------------------------------------------------------------  Urinalysis    Component Value Date/Time   COLORURINE YELLOW 07/08/2017 0306   APPEARANCEUR CLEAR 07/08/2017 0306   LABSPEC 1.024 07/08/2017 0306   PHURINE 5.0 07/08/2017 0306   GLUCOSEU NEGATIVE 07/08/2017 0306   HGBUR NEGATIVE 07/08/2017 0306   BILIRUBINUR NEGATIVE 07/08/2017 0306   KETONESUR NEGATIVE 07/08/2017 0306   PROTEINUR NEGATIVE 07/08/2017 0306   UROBILINOGEN 1.0 04/17/2015 1838   NITRITE NEGATIVE 07/08/2017 0306   LEUKOCYTESUR TRACE (A) 07/08/2017 0306    ----------------------------------------------------------------------------------------------------------------   Imaging Results:    Ct Head Wo Contrast  Result Date: 07/07/2017 CLINICAL DATA:  Fall in the bathtub tonight striking head. Syncope. Left-sided head  pain radiating down neck. Hematoma, on anticoagulation. EXAM: CT HEAD WITHOUT CONTRAST CT CERVICAL SPINE WITHOUT CONTRAST TECHNIQUE: Multidetector CT imaging of the head and cervical spine was performed following the standard protocol without intravenous contrast. Multiplanar CT image reconstructions of the cervical spine were also generated. COMPARISON:  CT 04/02/2016 FINDINGS: CT HEAD FINDINGS Brain: No intracranial hemorrhage, mass effect, or midline shift. No hydrocephalus. The basilar cisterns are patent. No evidence of territorial infarct. No  intracranial fluid collection. Stable atrophy. Stable chronic small vessel ischemia. Vascular: Atherosclerosis of skullbase vasculature without hyperdense vessel or abnormal calcification. Skull: No skull fracture.  No focal lesion. Sinuses/Orbits: Paranasal sinuses and mastoid air cells are clear. The visualized orbits are unremarkable. Postsurgical change at the right zygoma. Other: Left parietal scalp hematoma. CT CERVICAL SPINE FINDINGS Alignment: Normal. Skull base and vertebrae: No acute fracture. 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 Bony fusion of posterior elements of C2-C3, and C4-C5, right greater than left on a degenerative basis. Prominent Schmorl's node in superior endplate of T2 is unchanged. Soft tissues and spinal canal: No prevertebral fluid or swelling. No visible canal hematoma. Disc levels: Diffuse disc space narrowing and endplate spurring. Multilevel facet arthropathy. Upper chest: No acute abnormality. Other: Carotid calcifications. IMPRESSION: 1. Left parietal scalp hematoma. No skull fracture or acute intracranial abnormality. 2. Multilevel degenerative change in the cervical spine without acute fracture or subluxation. Electronically Signed   By: Jeb Levering M.D.   On: 07/07/2017 20:45   Ct Chest W Contrast  Result Date: 07/08/2017 CLINICAL DATA:  Fall, left-sided chest and abdominal pain EXAM: CT CHEST, ABDOMEN,  AND PELVIS WITH CONTRAST TECHNIQUE: Multidetector CT imaging of the chest, abdomen and pelvis was performed following the standard protocol during bolus administration of intravenous contrast. CONTRAST:  52mL ISOVUE-300 IOPAMIDOL (ISOVUE-300) INJECTION 61% COMPARISON:  02/28/2016, 03/31/2015, 11/13/2003 FINDINGS: CT CHEST FINDINGS Cardiovascular: Aortic atherosclerosis. No aneurysm is seen. Coronary artery calcification. Nonenlarged heart. No pericardial effusion. Mediastinum/Nodes: Midline trachea. No thyroid mass. Stable mediastinal lymph nodes. No mediastinal hematoma. Esophagus unremarkable. Lungs/Pleura: Innumerable bilateral pulmonary nodules. 16 mm right middle lobe pulmonary nodule, series 4, image number 104 stable. Right lower lobe pulmonary nodule measuring 18 mm, series 4, image number 17. Multiple additional pulmonary nodules grossly unchanged. No pneumothorax. No pleural effusion. Musculoskeletal: No acute or suspicious bone lesion. CT ABDOMEN PELVIS FINDINGS Hepatobiliary: No focal liver abnormality is seen. Status post cholecystectomy. No biliary dilatation. Pancreas: Unremarkable. No pancreatic ductal dilatation or surrounding inflammatory changes. Spleen: Normal in size without focal abnormality. Adrenals/Urinary Tract: Adrenal glands are unremarkable. Kidneys show no perinephric hematoma or hydronephrosis. Stable 12 mm low-density lesion mid right kidney. Stable 10 mm exophytic lesion mid to lower left kidney since 2017. Bladder unremarkable. Stomach/Bowel: The stomach is nonenlarged. No dilated small bowel. Sigmoid colon diverticular disease. Mild wall thickening and surrounding inflammation in the left lower quadrant around the sigmoid colon. Vascular/Lymphatic: Aortic atherosclerosis. subcentimeter retroperitoneal lymph nodes. Reproductive: Status post hysterectomy. No adnexal masses. Other: No free air or free fluid. Musculoskeletal: No acute or suspicious bone lesion. IMPRESSION: 1. Negative  for acute thoracic injury. Negative for acute solid organ injury, free air or free fluid 2. Mild focal wall thickening and surrounding inflammation at the sigmoid colon suspicious for an acute diverticulitis. No perforation or abscess. 3. Stable multiple pulmonary nodules 4. 10 mm exophytic indeterminate left renal lesion, stable since 2017. Electronically Signed   By: Donavan Foil M.D.   On: 07/08/2017 03:39   Ct Cervical Spine Wo Contrast  Result Date: 07/07/2017 CLINICAL DATA:  Fall in the bathtub tonight striking head. Syncope. Left-sided head pain radiating down neck. Hematoma, on anticoagulation. EXAM: CT HEAD WITHOUT CONTRAST CT CERVICAL SPINE WITHOUT CONTRAST TECHNIQUE: Multidetector CT imaging of the head and cervical spine was performed following the standard protocol without intravenous contrast. Multiplanar CT image reconstructions of the cervical spine were also generated. COMPARISON:  CT 04/02/2016 FINDINGS: CT HEAD FINDINGS Brain: No intracranial hemorrhage,  mass effect, or midline shift. No hydrocephalus. The basilar cisterns are patent. No evidence of territorial infarct. No intracranial fluid collection. Stable atrophy. Stable chronic small vessel ischemia. Vascular: Atherosclerosis of skullbase vasculature without hyperdense vessel or abnormal calcification. Skull: No skull fracture.  No focal lesion. Sinuses/Orbits: Paranasal sinuses and mastoid air cells are clear. The visualized orbits are unremarkable. Postsurgical change at the right zygoma. Other: Left parietal scalp hematoma. CT CERVICAL SPINE FINDINGS Alignment: Normal. Skull base and vertebrae: No acute fracture. 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 Bony fusion of posterior elements of C2-C3, and C4-C5, right greater than left on a degenerative basis. Prominent Schmorl's node in superior endplate of T2 is unchanged. Soft tissues and spinal canal: No prevertebral fluid or swelling. No visible canal hematoma.  Disc levels: Diffuse disc space narrowing and endplate spurring. Multilevel facet arthropathy. Upper chest: No acute abnormality. Other: Carotid calcifications. IMPRESSION: 1. Left parietal scalp hematoma. No skull fracture or acute intracranial abnormality. 2. Multilevel degenerative change in the cervical spine without acute fracture or subluxation. Electronically Signed   By: Jeb Levering M.D.   On: 07/07/2017 20:45   Ct Abdomen Pelvis W Contrast  Result Date: 07/08/2017 CLINICAL DATA:  Fall, left-sided chest and abdominal pain EXAM: CT CHEST, ABDOMEN, AND PELVIS WITH CONTRAST TECHNIQUE: Multidetector CT imaging of the chest, abdomen and pelvis was performed following the standard protocol during bolus administration of intravenous contrast. CONTRAST:  78mL ISOVUE-300 IOPAMIDOL (ISOVUE-300) INJECTION 61% COMPARISON:  02/28/2016, 03/31/2015, 11/13/2003 FINDINGS: CT CHEST FINDINGS Cardiovascular: Aortic atherosclerosis. No aneurysm is seen. Coronary artery calcification. Nonenlarged heart. No pericardial effusion. Mediastinum/Nodes: Midline trachea. No thyroid mass. Stable mediastinal lymph nodes. No mediastinal hematoma. Esophagus unremarkable. Lungs/Pleura: Innumerable bilateral pulmonary nodules. 16 mm right middle lobe pulmonary nodule, series 4, image number 104 stable. Right lower lobe pulmonary nodule measuring 18 mm, series 4, image number 17. Multiple additional pulmonary nodules grossly unchanged. No pneumothorax. No pleural effusion. Musculoskeletal: No acute or suspicious bone lesion. CT ABDOMEN PELVIS FINDINGS Hepatobiliary: No focal liver abnormality is seen. Status post cholecystectomy. No biliary dilatation. Pancreas: Unremarkable. No pancreatic ductal dilatation or surrounding inflammatory changes. Spleen: Normal in size without focal abnormality. Adrenals/Urinary Tract: Adrenal glands are unremarkable. Kidneys show no perinephric hematoma or hydronephrosis. Stable 12 mm low-density lesion  mid right kidney. Stable 10 mm exophytic lesion mid to lower left kidney since 2017. Bladder unremarkable. Stomach/Bowel: The stomach is nonenlarged. No dilated small bowel. Sigmoid colon diverticular disease. Mild wall thickening and surrounding inflammation in the left lower quadrant around the sigmoid colon. Vascular/Lymphatic: Aortic atherosclerosis. subcentimeter retroperitoneal lymph nodes. Reproductive: Status post hysterectomy. No adnexal masses. Other: No free air or free fluid. Musculoskeletal: No acute or suspicious bone lesion. IMPRESSION: 1. Negative for acute thoracic injury. Negative for acute solid organ injury, free air or free fluid 2. Mild focal wall thickening and surrounding inflammation at the sigmoid colon suspicious for an acute diverticulitis. No perforation or abscess. 3. Stable multiple pulmonary nodules 4. 10 mm exophytic indeterminate left renal lesion, stable since 2017. Electronically Signed   By: Donavan Foil M.D.   On: 07/08/2017 03:39   Dg Hip Unilat  With Pelvis 2-3 Views Right  Result Date: 07/07/2017 CLINICAL DATA:  Fall with pain into the right posterior hip region EXAM: DG HIP (WITH OR WITHOUT PELVIS) 2-3V RIGHT COMPARISON:  01/02/2017 FINDINGS: Mild arthritis of the hips. Pubic symphysis is intact. Mild SI joint disease. No fracture or malalignment. IMPRESSION: No definite acute osseous abnormality. Electronically  Signed   By: Donavan Foil M.D.   On: 07/07/2017 23:50      Assessment & Plan:    Active Problems:   Atrial fibrillation (HCC) CHA2DS2-VASc Score 7   Syncope   Anemia   Chest pain   ARF (acute renal failure) (HCC)    Diverticulitis uncomplicated Cipro, flagyl iv  Fall (mechanical), chest pain,, syncope Tele Trop I q6h x3,    ARF Ns iv Check cmp in am  A nemia Check cbc in am  Dm2 fsbs ac and qhs iss  Pafib (chads2vasc=7) xarelto pharmacy to dose Cont cardizem, metoprolol, amiodarone     DVT Prophylaxis SCDs   AM Labs  Ordered, also please review Full Orders  Family Communication: Admission, patients condition and plan of care including tests being ordered have been discussed with the patient  who indicate understanding and agree with the plan and Code Status.  Code Status FULL CODE  Likely DC to  home  Condition GUARDED    Consults called:   Admission status: observation, likely d/c home on 7/12  Time spent in minutes : 45 minutes   Jani Gravel M.D on 07/08/2017 at 4:29 AM  Between 7am to 7pm - Pager - [phone] After 7pm go to [email or web address] - password [facility]  [facility] - Office  [phone]

## 2017-07-08 NOTE — Progress Notes (Signed)
Patient seen and examined this morning, admitted overnight by Dr. Maudie Mercury.  In brief, this is a pleasant 83-year-old female with diabetes, paroxysmal A. fib, coronary artery disease who slipped and fell in the bathtub, and blacked out for a few minutes.  CT scan of the brain was negative for intracranial bleed however showed left parietal scalp hematoma.  She also had a CT scan of the abdomen and pelvis which showed acute diverticulitis  Was evaluated by PT today, recommending a repeat evaluation if later tonight or tomorrow.  Continue ciprofloxacin and metronidazole for diverticulitis.  Continue her home medications for the rest of her problems.  May be able to go home tomorrow once she works again with PT  Costin M. Cruzita Lederer, MD [facility] [phone]  If 7PM-7AM, please contact night-coverage [email or web address] Password [email or web address]

## 2017-07-08 NOTE — ED Notes (Signed)
Pt is on the bed pan and expressing that it hurts when she pees

## 2017-07-08 NOTE — ED Notes (Signed)
Drinking ginger ale.  Dr [name] aware.

## 2017-07-08 NOTE — ED Provider Notes (Signed)
By signing my name below, I, [name], attest that this documentation has been prepared under the direction and in the presence of [name], [name], DO. Electronically Signed: [name], ED Scribe. 07/08/17. 12:58 AM.  TIME SEEN: 12:51 AM  CHIEF COMPLAINT:  Chief Complaint  Patient presents with  . Fall    xarelto, struck head   HPI:  HPI Comments: [name] is a 83 y.o. female with h/o CAD, DM, HTN, CVA, A-fib on Xarelto, and lung cancer, who presents to the Emergency Department complaining of an area of pain and swelling to her posterior head following mechanical fall occurring just PTA. She states the pain further radiates into her left ear and eye. Pt reports she slipped and fell in the bathtub, causing her to strike her head. She reports she lost consciousness for "a few minutes". Pt also has intermittent left-sided chest and abdominal pain secondary to the fall. This pain is reproducible with palpation. Pt denies back pain, focal numbness, additional injuries.  PCP: Deanna Kraft, MD Cardiologist: Deanna Prows, MD  ROS: See HPI Constitutional: no fever  Eyes: no drainage  ENT: no runny nose   Cardiovascular: chest pain  Resp: no SOB  GI: no vomiting GU: no dysuria Integumentary: no rash  Allergy: no hives  Musculoskeletal: no leg swelling  Neurological: no slurred speech ROS otherwise negative  PAST MEDICAL HISTORY/PAST SURGICAL HISTORY:  Past Medical History:  Diagnosis Date  . CAD (coronary artery disease) 03/13/2012  . Diabetes mellitus   . Dysrhythmia    ATRIAL FIB  . HTN (hypertension) 03/13/2012  . Hypothyroid 03/13/2012  . Lung cancer ([place])   . Shortness of breath   . Stroke ([place])   . Syncope and collapse 04/02/2016  . Type II or unspecified type diabetes mellitus without mention of complication, not stated as uncontrolled 03/13/2012    MEDICATIONS:  Prior to Admission medications   Medication Sig Start Date End Date Taking? Authorizing Provider   acetaminophen (TYLENOL) 500 MG tablet Take 500 mg by mouth every 6 (six) hours as needed for moderate pain or headache.     [provider]  ALPRAZolam Duanne Moron) 0.25 MG tablet Take 1 tablet (0.25 mg total) by mouth at bedtime. 12/17/16   Dixie Dials, MD  amiodarone (PACERONE) 200 MG tablet Take 1 tablet (200 mg total) by mouth daily. 12/18/16   Dixie Dials, MD  Calcium Carb-Cholecalciferol (CALCIUM 600-D PO) Take 600 mg by mouth daily.    [provider]  clobetasol cream (TEMOVATE) 0.34 % Apply 1 application topically 2 (two) times daily as needed (itching/ do not use on face).    [provider]  colesevelam (WELCHOL) 625 MG tablet Take 312.5-625 mg by mouth daily as needed (diarrhea).    [provider]  diltiazem (CARDIZEM CD) 240 MG 24 hr capsule Take 1 capsule (240 mg total) by mouth daily. 12/18/16   Dixie Dials, MD  hydrOXYzine (ATARAX/VISTARIL) 25 MG tablet Take 1 tablet (25 mg total) by mouth every 8 (eight) hours as needed for itching. 12/17/16   Dixie Dials, MD  Insulin Detemir (LEVEMIR) 100 UNIT/ML Pen Inject 20 Units into the skin daily at 10 pm. Patient taking differently: Inject 20 Units into the skin every morning.  12/17/16   Dixie Dials, MD  levothyroxine (SYNTHROID, LEVOTHROID) 100 MCG tablet Take 1 tablet (100 mcg total) by mouth daily before breakfast. Patient taking differently: Take 150 mcg by mouth daily before breakfast.  12/17/16   Dixie Dials, MD  meclizine (ANTIVERT) 25 MG tablet Take 25 mg by mouth 4 (four) times daily as needed for dizziness.    [provider]  metoprolol (LOPRESSOR) 50 MG tablet Take 50 mg by mouth 2 (two) times daily.    [provider]  mupirocin ointment (BACTROBAN) 2 % Apply 1 application topically daily as needed (itching).     [provider]  pantoprazole (PROTONIX) 40 MG tablet Take 1 tablet (40 mg total) by mouth daily. 12/18/16   Dixie Dials, MD  pramoxine (SARNA  SENSITIVE) 1 % LOTN Apply 1 application topically 3 (three) times daily as needed (itching).    [provider]  Rivaroxaban (XARELTO) 15 MG TABS tablet Take 15 mg by mouth daily with supper. 03/16/12   Deanna Prows, MD  traMADol (ULTRAM) 50 MG tablet Take 1-2 tablets (50-100 mg total) by mouth every 6 (six) hours as needed for moderate pain. 01/04/17   Velvet Bathe, MD    ALLERGIES:  Allergies  Allergen Reactions  . Codeine Nausea And Vomiting  . Hydrocodone-Acetaminophen Nausea And Vomiting    SOCIAL HISTORY:  Social History  Substance Use Topics  . Smoking status: Never Smoker  . Smokeless tobacco: Never Used  . Alcohol use No    FAMILY HISTORY: Family History  Problem Relation Age of Onset  . Breast cancer Mother   . Aneurysm Father        Likely thoracic aneurysm per patient  . Melanoma Brother   . Heart disease Sister   . Diabetes type II Sister     EXAM: BP 97/64   Pulse 100   Temp 97.8 F (36.6 C) (Oral)   Resp 13   Wt 128 lb (58.1 kg)   SpO2 96%   BMI 22.67 kg/m  CONSTITUTIONAL: Alert and oriented and responds appropriately to questions. Well-appearing; well-nourished; GCS 15 HEAD: Normocephalic; hematoma to the left posterior scalp EYES: Conjunctivae clear, PERRL, EOMI ENT: normal nose; no rhinorrhea; moist mucous membranes; pharynx without lesions noted; no dental injury; no septal hematoma NECK: Supple, no meningismus, no LAD; no midline spinal tenderness, step-off or deformity; trachea midline CARD: RRR; S1 and S2 appreciated; no murmurs, no clicks, no rubs, no gallops RESP: Normal chest excursion without splinting or tachypnea; breath sounds clear and equal bilaterally; no wheezes, no rhonchi, no rales; no hypoxia or respiratory distress CHEST: chest wall stable, no crepitus or ecchymosis or deformity, tender throughout left chest wall; no flail chest ABD/GI: Normal bowel sounds; non-distended; soft, left abdomen tender with voluntary guarding, no  rebound; no ecchymosis or other lesions noted PELVIS:  stable, Mild tender over the right hip without leg length discrepancy, full range of motion in this joint BACK:  The back appears normal and is non-tender to palpation, there is no CVA tenderness; no midline spinal tenderness, step-off or deformity EXT: Normal ROM in all joints; non-tender to palpation; no edema; normal capillary refill; no cyanosis, no bony tenderness or bony deformity of patient's extremities, no joint effusion, compartments are soft, extremities are warm and well-perfused, no ecchymosis SKIN: Normal color for age and race; warm NEURO: Moves all extremities equally PSYCH: The patient's mood and manner are appropriate. Grooming and personal hygiene are appropriate.  MEDICAL DECISION MAKING: Patient here after mechanical fall. States she slipped in the bathtub and fell. CT of her head and cervical spine obtained in triage which are unremarkable other than parietal hematoma. X-ray of the right hip is unremarkable. Patient is slightly hypotensive which is not her baseline. She  does not feel dizzy. She denies chest pain or shortness of breath. No recent vomiting or diarrhea. We'll obtain CTs of her chest and abdomen given she is tender over these areas. We'll give IV fluids.  ED PROGRESS: CT scan shows acute diverticulitis. No perforation or abscess. We'll give IV Cipro and IV Flagyl and continue IV hydration. No other acute traumatic abnormality. We'll discuss with medicine for admission.  3:58 AM Discussed patient's case with hospitalist, Dr. Maudie Mercury.  I have recommended admission and patient (and family if present) agree with this plan. Admitting physician will place admission orders.   I reviewed all nursing notes, vitals, pertinent previous records, EKGs, lab and urine results, imaging (as available).     Date: 07/08/2017  00:13   Rate: 90  Rhythm: A. fib  QRS Axis: normal  Intervals: Prolonged QT interval  ST/T Wave  abnormalities: normal  Conduction Disutrbances: none  Narrative Interpretation: A. fib, prolonged QT interval, low voltage QRS, unchanged compared to baseline     I personally performed the services described in this documentation, which was scribed in my presence. The recorded information has been reviewed and is accurate.     [name], [name], DO 07/08/17 [phone]

## 2017-07-08 NOTE — Evaluation (Signed)
Physical Therapy Evaluation Patient Details Name: [name] MRN: [id number] DOB: 1934-06-28 Today's Date: 07/08/2017   History of Present Illness  Pt is a 83 yo female with c/o of posterior head pain radiating to left ear and eye as well as R hip pain after mechanical fall in bath tub.  PMH for CAD, DM, HTN, CVA, A-fib on Xarelto, and lung cancer.  Clinical Impression  Pt admitted with above diagnosis. Pt currently with functional limitations due to the deficits listed below (see PT Problem List). Pt is min guard for bed mobility, minA for transfers and min guard for ambulation of 50 feet. Pt has 8 steps to get into house and pt was too painful in L LE and fatigued after ambulation to attempt stair training. Stair training should be completed before discharge to ensure safety. PT will attempt later this afternoon as able. Pt will benefit from skilled PT to increase their independence and safety with mobility to allow discharge to the venue listed below.       Follow Up Recommendations Home health PT;Supervision/Assistance - 24 hour    Equipment Recommendations  None recommended by PT       Precautions / Restrictions Precautions Precautions: Fall Restrictions Weight Bearing Restrictions: No      Mobility  Bed Mobility Overal bed mobility: Needs Assistance Bed Mobility: Supine to Sit     Supine to sit: Min guard     General bed mobility comments: min guard for safety requires bedrail to pull to EoB  Transfers Overall transfer level: Needs assistance Equipment used: Rolling walker (2 wheeled) Transfers: Sit to/from Stand Sit to Stand: Min assist         General transfer comment: minA for power up, vc for hand placement on bed instead of RW for power up   Ambulation/Gait Ambulation/Gait assistance: Min guard Ambulation Distance (Feet): 50 Feet Assistive device: Rolling walker (2 wheeled) Gait Pattern/deviations: Decreased weight shift to left;Decreased stance  time - left;Shuffle;Trunk flexed;Antalgic Gait velocity: slowed Gait velocity interpretation: Below normal speed for age/gender General Gait Details: min guard for safety, pt with progressively decreasing weightshift and stance on L side, as well as increased reliance on UE support with RW. Pt reports that her UE are "giving out"     Balance Overall balance assessment: Needs assistance Sitting-balance support: No upper extremity supported;Feet supported Sitting balance-Leahy Scale: Good     Standing balance support: No upper extremity supported;During functional activity Standing balance-Leahy Scale: Fair Standing balance comment: stable within her BoS with washing hands but requires single arm support to reach outside BoS                             Pertinent Vitals/Pain Pain Assessment: 0-10 Pain Score: 9  Pain Location: L side of head and eye Pain Descriptors / Indicators: Throbbing;Guarding;Constant  VSS    Home Living Family/patient expects to be discharged to:: Private residence Living Arrangements: Spouse/significant other Available Help at Discharge: Family;Available 24 hours/day Type of Home: House Home Access: Stairs to enter Entrance Stairs-Rails: Can reach both Entrance Stairs-Number of Steps: 8 Home Layout: Two level;Able to live on main level with bedroom/bathroom Home Equipment: Gilford Rile - 2 wheels;Cane - single point;Bedside commode;Tub bench      Prior Function Level of Independence: Independent with assistive device(s)         Comments: uses walker when she is sore or doesn't feel well, llimited community ambulator about every 3 weeks,  otherwise husband does shopping     Hand Dominance   Dominant Hand: Left    Extremity/Trunk Assessment        Lower Extremity Assessment Lower Extremity Assessment: LLE deficits/detail;RLE deficits/detail RLE Deficits / Details: strength grossly 4/5, ROM WFL RLE Sensation: decreased light touch (in  toes ) LLE Deficits / Details: L leg buckling suspected cause of fall, hip and knee strength grossly 3+/5, ROM WFL, plantar pain with weightbearing  LLE Sensation: decreased light touch (in toes)    Cervical / Trunk Assessment Cervical / Trunk Assessment: Kyphotic  Communication   Communication: No difficulties  Cognition Arousal/Alertness: Awake/alert Behavior During Therapy: WFL for tasks assessed/performed Overall Cognitive Status: Within Functional Limits for tasks assessed                                        General Comments General comments (skin integrity, edema, etc.): at entry pt on 2L O2 via nasal cannula and SaO2 is 97% O2 HR 112 bpm, pt reports she is not on O2 except to sleep at home, on RA at rest SaO2 94%O2 HR 117 bpm, with ambultion on RA SaO2 93% 122bpm         Assessment/Plan    PT Assessment Patient needs continued PT services  PT Problem List Decreased strength;Decreased activity tolerance;Decreased balance;Decreased mobility;Decreased knowledge of use of DME;Impaired sensation;Decreased safety awareness;Pain       PT Treatment Interventions DME instruction;Gait training;Stair training;Functional mobility training;Therapeutic activities;Therapeutic exercise;Balance training;Patient/family education    PT Goals (Current goals can be found in the Care Plan section)  Acute Rehab PT Goals Patient Stated Goal: go home PT Goal Formulation: With patient Time For Goal Achievement: 07/15/17 Potential to Achieve Goals: Fair    Frequency Min 3X/week   Barriers to discharge Inaccessible home environment         AM-PAC PT "6 Clicks" Daily Activity  Outcome Measure Difficulty turning over in bed (including adjusting bedclothes, sheets and blankets)?: A Lot Difficulty moving from lying on back to sitting on the side of the bed? : A Lot Difficulty sitting down on and standing up from a chair with arms (e.g., wheelchair, bedside commode, etc,.)?:  A Little Help needed moving to and from a bed to chair (including a wheelchair)?: A Little Help needed walking in hospital room?: A Little Help needed climbing 3-5 steps with a railing? : A Lot 6 Click Score: 15    End of Session Equipment Utilized During Treatment: Gait belt Activity Tolerance: Patient limited by fatigue;Patient limited by pain Patient left: in chair;with call bell/phone within reach;with family/visitor present Nurse Communication: Mobility status PT Visit Diagnosis: Unsteadiness on feet (R26.81);Other abnormalities of gait and mobility (R26.89);Muscle weakness (generalized) (M62.81);History of falling (Z91.81);Difficulty in walking, not elsewhere classified (R26.2);Pain Pain - Right/Left: Left Pain - part of body:  (head and eye)    Time: 4540-9811 PT Time Calculation (min) (ACUTE ONLY): 47 min   Charges:   PT Evaluation $PT Eval Moderate Complexity: 1 Procedure PT Treatments $Gait Training: 23-37 mins $Therapeutic Activity: 23-37 mins   PT G Codes:   PT G-Codes **NOT FOR INPATIENT CLASS** Functional Assessment Tool Used: AM-PAC 6 Clicks Basic Mobility Functional Limitation: Mobility: Walking and moving around Mobility: Walking and Moving Around Current Status (B1478): At least 40 percent but less than 60 percent impaired, limited or restricted Mobility: Walking and Moving Around Goal Status [phone]): At least  1 percent but less than 20 percent impaired, limited or restricted    Benjamine Mola B. Migdalia Dk PT, DPT Acute Rehabilitation  [phone] Pager [phone]    [place] 07/08/2017, 10:55 AM

## 2017-07-09 ENCOUNTER — Observation Stay (HOSPITAL_COMMUNITY): Payer: Medicare Other

## 2017-07-09 DIAGNOSIS — N179 Acute kidney failure, unspecified: Secondary | ICD-10-CM

## 2017-07-09 DIAGNOSIS — I480 Paroxysmal atrial fibrillation: Secondary | ICD-10-CM | POA: Diagnosis not present

## 2017-07-09 DIAGNOSIS — R55 Syncope and collapse: Secondary | ICD-10-CM

## 2017-07-09 DIAGNOSIS — K5792 Diverticulitis of intestine, part unspecified, without perforation or abscess without bleeding: Secondary | ICD-10-CM | POA: Diagnosis not present

## 2017-07-09 DIAGNOSIS — W19XXXA Unspecified fall, initial encounter: Secondary | ICD-10-CM | POA: Diagnosis not present

## 2017-07-09 LAB — ECHOCARDIOGRAM COMPLETE
Height: 63 in
Weight: 2222.24 oz

## 2017-07-09 LAB — GLUCOSE, CAPILLARY
Glucose-Capillary: 180 mg/dL — ABNORMAL HIGH (ref 65–99)
Glucose-Capillary: 197 mg/dL — ABNORMAL HIGH (ref 65–99)

## 2017-07-09 MED ORDER — CIPROFLOXACIN HCL 500 MG PO TABS: 500.0000 mg | ORAL_TABLET | Freq: Two times a day (BID) | ORAL | 0 refills | Status: AC

## 2017-07-09 MED ORDER — METRONIDAZOLE 500 MG PO TABS: 500.0000 mg | ORAL_TABLET | Freq: Three times a day (TID) | ORAL | 0 refills | Status: AC

## 2017-07-09 NOTE — Discharge Summary (Signed)
Physician Discharge Summary  [name] JJH:[id number] DOB: 1934-08-22 DOA: 07/07/2017  PCP: Deanna Roers, MD  Admit date: 07/07/2017 Discharge date: 07/09/2017  Admitted From: home Disposition:  home  Recommendations for Outpatient Follow-up:  1. Follow up with PCP in 1-2 weeks 2. Continue Ciprofloxacin and Metronidazole for 7 days   Home Health: PT Equipment/Devices: cane, walker  Discharge Condition: stable CODE STATUS: Full code Diet recommendation: regular  HPI: Per Dr. Maudie [name], [name]  is a 83 y.o. female, w Diabetes , Pafib (chads2vasc=) , CAD apparently fell in the bathtub.  Her leg just gave out under her,  She grabbed the bar, but it didn't help.  Hit left side of head.  Blacked out for a few minutes.  Had to crawl out of the tub.  Stomach and side hurt.   In ED,  CT brain 7/11=> negative, CT chest/abd pelvis=> mild focal wall thickening suspicious for acute diverticulitis, no perforation or abscess.  10 mm exophytic indeterminate left renal lesion.  Pt will be admitted for chest pain, lower left rib,  likely musculoskeletal and diverticulitis.    Hospital Course: Discharge Diagnoses:  Active Problems:   Atrial fibrillation (HCC) CHA2DS2-VASc Score 7   Syncope   Anemia   Chest pain   ARF (acute renal failure) (HCC)   Acute diverticulitis  Fall (mechanical) -patient was admitted to the hospital with a fall, without any preceding chest pain/palpitations/syncope.  CT scan of the brain on admission was negative for intracranial bleed however showed a small left parietal scalp hematoma.  Physical therapy evaluated patient and recommended home health PT, which was arranged prior to discharge.  Telemetry was without events, troponin was checked and remained negative.  She underwent a 2D echo which showed normal ejection fraction of 60-65% and grade 1 diastolic dysfunction.  She is moderate aortic stenosis. Diverticulitis uncomplicated -patient with mild abdominal  pain, resolved, responded well to antibiotics, able to tolerate a diet, will treat for 7 additional days with Cipro ofloxacin and metronidazole AKI -possibly due to slight dehydration, creatinine improved following IV fluids Anemia -mild anemia of chronic disease, stable Dm2 -continue home regimen  Pafib (chads2vasc=7) - xarelto pharmacy to dose, cont cardizem, metoprolol, amiodarone  Discharge Instructions   Allergies as of 07/09/2017      Reactions   Codeine Nausea And Vomiting   Hydrocodone-acetaminophen Nausea And Vomiting      Medication List    TAKE these medications   acetaminophen 500 MG tablet Commonly known as:  TYLENOL Take 500 mg by mouth every 6 (six) hours as needed for moderate pain or headache.   ALPRAZolam 0.25 MG tablet Commonly known as:  XANAX Take 1 tablet (0.25 mg total) by mouth at bedtime.   amiodarone 200 MG tablet Commonly known as:  PACERONE Take 1 tablet (200 mg total) by mouth daily.   CALCIUM 600-D PO Take 600 mg by mouth daily.   ciprofloxacin 500 MG tablet Commonly known as:  CIPRO Take 1 tablet (500 mg total) by mouth 2 (two) times daily.   clobetasol cream 0.05 % Commonly known as:  TEMOVATE Apply 1 application topically 2 (two) times daily as needed (itching/ do not use on face).   colesevelam 625 MG tablet Commonly known as:  WELCHOL Take 312.5-625 mg by mouth daily as needed (diarrhea).   diltiazem 240 MG 24 hr capsule Commonly known as:  CARDIZEM CD Take 1 capsule (240 mg total) by mouth daily.   Insulin Detemir 100 UNIT/ML Pen Commonly  known as:  LEVEMIR Inject 20 Units into the skin daily at 10 pm. What changed:  when to take this   levothyroxine 100 MCG tablet Commonly known as:  SYNTHROID, LEVOTHROID Take 1 tablet (100 mcg total) by mouth daily before breakfast. What changed:  how much to take   meclizine 25 MG tablet Commonly known as:  ANTIVERT Take 25 mg by mouth 4 (four) times daily as needed for dizziness.     metoprolol tartrate 50 MG tablet Commonly known as:  LOPRESSOR Take 50 mg by mouth 2 (two) times daily.   metroNIDAZOLE 500 MG tablet Commonly known as:  FLAGYL Take 1 tablet (500 mg total) by mouth 3 (three) times daily.   mupirocin ointment 2 % Commonly known as:  BACTROBAN Apply 1 application topically daily as needed (itching).   pantoprazole 40 MG tablet Commonly known as:  PROTONIX Take 1 tablet (40 mg total) by mouth daily.   Rivaroxaban 15 MG Tabs tablet Commonly known as:  XARELTO Take 15 mg by mouth daily with supper.   SARNA SENSITIVE 1 % Lotn Generic drug:  pramoxine Apply 1 application topically 3 (three) times daily as needed (itching).            Durable Medical Equipment        Start     Ordered   07/09/17 1441  For home use only DME Cane  Once     07/09/17 1440     Follow-up Information    [name], Deanna Rinks, MD. Schedule an appointment as soon as possible for a visit in 3 week(s).   Specialty:  Family Medicine Contact information: [address] [place] [address] [phone]        [name], [name] Follow up.   Specialty:  Home Health Services Why:  HHPT arranged- please allow 24-48 hr post discharge for them to call and set up home visits Contact information: [place] [address] [place] Follow up.   Why:  cane arranged- to be delivered to room prior to discharge Contact information: [place] [address] [phone]          Allergies  Allergen Reactions  . Codeine Nausea And Vomiting  . Hydrocodone-Acetaminophen Nausea And Vomiting    Consultations:  None   Procedures/Studies:  2D echo  Study Conclusions - Left ventricle: The cavity size was normal. Mildly increased relative wall thickness. There was hypertrophy of the posterior wall. Systolic function was normal. The estimated ejection fraction was in the  range of 60% to 65%. Doppler parameters are consistent with abnormal left ventricular relaxation (grade 1 diastolic dysfunction). - Aortic valve: Transvalvular velocity was increased, due to stenosis. There was moderate stenosis. There was mild regurgitation. Valve area (VTI): 0.89 cm^2. Valve area (Vmax): 0.84 cm^2. Valve area (Vmean): 0.8 cm^2. - Aorta: The aorta was moderately calcified and moderately diseased. - Mitral valve: Calcified annulus. Mildly thickened leaflets. Mobility of the posterior leaflet was severely restricted. The findings are consistent with mild stenosis. There was moderate regurgitation. Valve area by continuity equation (using LVOT flow): 1.59 cm^2. - Left atrium: The atrium was moderately dilated. - Right atrium: The atrium was mildly dilated. - Tricuspid valve: There was moderate regurgitation. - Pulmonary arteries: Systolic pressure was mildly to moderately increased. PA peak pressure: 46 mm Hg (S).  Ct Head Wo Contrast  Result Date: 07/07/2017 CLINICAL  DATA:  Fall in the bathtub tonight striking head. Syncope. Left-sided head pain radiating down neck. Hematoma, on anticoagulation. EXAM: CT HEAD WITHOUT CONTRAST CT CERVICAL SPINE WITHOUT CONTRAST TECHNIQUE: Multidetector CT imaging of the head and cervical spine was performed following the standard protocol without intravenous contrast. Multiplanar CT image reconstructions of the cervical spine were also generated. COMPARISON:  CT 04/02/2016 FINDINGS: CT HEAD FINDINGS Brain: No intracranial hemorrhage, mass effect, or midline shift. No hydrocephalus. The basilar cisterns are patent. No evidence of territorial infarct. No intracranial fluid collection. Stable atrophy. Stable chronic small vessel ischemia. Vascular: Atherosclerosis of skullbase vasculature without hyperdense vessel or abnormal calcification. Skull: No skull fracture.  No focal lesion. Sinuses/Orbits: Paranasal sinuses and mastoid air cells are clear. The  visualized orbits are unremarkable. Postsurgical change at the right zygoma. Other: Left parietal scalp hematoma. CT CERVICAL SPINE FINDINGS Alignment: Normal. Skull base and vertebrae: No acute fracture. 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 Bony fusion of posterior elements of C2-C3, and C4-C5, right greater than left on a degenerative basis. Prominent Schmorl's node in superior endplate of T2 is unchanged. Soft tissues and spinal canal: No prevertebral fluid or swelling. No visible canal hematoma. Disc levels: Diffuse disc space narrowing and endplate spurring. Multilevel facet arthropathy. Upper chest: No acute abnormality. Other: Carotid calcifications. IMPRESSION: 1. Left parietal scalp hematoma. No skull fracture or acute intracranial abnormality. 2. Multilevel degenerative change in the cervical spine without acute fracture or subluxation. Electronically Signed   By: Jeb Levering M.D.   On: 07/07/2017 20:45   Ct Chest W Contrast  Result Date: 07/08/2017 CLINICAL DATA:  Fall, left-sided chest and abdominal pain EXAM: CT CHEST, ABDOMEN, AND PELVIS WITH CONTRAST TECHNIQUE: Multidetector CT imaging of the chest, abdomen and pelvis was performed following the standard protocol during bolus administration of intravenous contrast. CONTRAST:  56mL ISOVUE-300 IOPAMIDOL (ISOVUE-300) INJECTION 61% COMPARISON:  02/28/2016, 03/31/2015, 11/13/2003 FINDINGS: CT CHEST FINDINGS Cardiovascular: Aortic atherosclerosis. No aneurysm is seen. Coronary artery calcification. Nonenlarged heart. No pericardial effusion. Mediastinum/Nodes: Midline trachea. No thyroid mass. Stable mediastinal lymph nodes. No mediastinal hematoma. Esophagus unremarkable. Lungs/Pleura: Innumerable bilateral pulmonary nodules. 16 mm right middle lobe pulmonary nodule, series 4, image number 104 stable. Right lower lobe pulmonary nodule measuring 18 mm, series 4, image number 17. Multiple additional pulmonary nodules grossly  unchanged. No pneumothorax. No pleural effusion. Musculoskeletal: No acute or suspicious bone lesion. CT ABDOMEN PELVIS FINDINGS Hepatobiliary: No focal liver abnormality is seen. Status post cholecystectomy. No biliary dilatation. Pancreas: Unremarkable. No pancreatic ductal dilatation or surrounding inflammatory changes. Spleen: Normal in size without focal abnormality. Adrenals/Urinary Tract: Adrenal glands are unremarkable. Kidneys show no perinephric hematoma or hydronephrosis. Stable 12 mm low-density lesion mid right kidney. Stable 10 mm exophytic lesion mid to lower left kidney since 2017. Bladder unremarkable. Stomach/Bowel: The stomach is nonenlarged. No dilated small bowel. Sigmoid colon diverticular disease. Mild wall thickening and surrounding inflammation in the left lower quadrant around the sigmoid colon. Vascular/Lymphatic: Aortic atherosclerosis. subcentimeter retroperitoneal lymph nodes. Reproductive: Status post hysterectomy. No adnexal masses. Other: No free air or free fluid. Musculoskeletal: No acute or suspicious bone lesion. IMPRESSION: 1. Negative for acute thoracic injury. Negative for acute solid organ injury, free air or free fluid 2. Mild focal wall thickening and surrounding inflammation at the sigmoid colon suspicious for an acute diverticulitis. No perforation or abscess. 3. Stable multiple pulmonary nodules 4. 10 mm exophytic indeterminate left renal lesion, stable since 2017. Electronically Signed   By: Donavan Foil  M.D.   On: 07/08/2017 03:39   Ct Cervical Spine Wo Contrast  Result Date: 07/07/2017 CLINICAL DATA:  Fall in the bathtub tonight striking head. Syncope. Left-sided head pain radiating down neck. Hematoma, on anticoagulation. EXAM: CT HEAD WITHOUT CONTRAST CT CERVICAL SPINE WITHOUT CONTRAST TECHNIQUE: Multidetector CT imaging of the head and cervical spine was performed following the standard protocol without intravenous contrast. Multiplanar CT image  reconstructions of the cervical spine were also generated. COMPARISON:  CT 04/02/2016 FINDINGS: CT HEAD FINDINGS Brain: No intracranial hemorrhage, mass effect, or midline shift. No hydrocephalus. The basilar cisterns are patent. No evidence of territorial infarct. No intracranial fluid collection. Stable atrophy. Stable chronic small vessel ischemia. Vascular: Atherosclerosis of skullbase vasculature without hyperdense vessel or abnormal calcification. Skull: No skull fracture.  No focal lesion. Sinuses/Orbits: Paranasal sinuses and mastoid air cells are clear. The visualized orbits are unremarkable. Postsurgical change at the right zygoma. Other: Left parietal scalp hematoma. CT CERVICAL SPINE FINDINGS Alignment: Normal. Skull base and vertebrae: No acute fracture. 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 Bony fusion of posterior elements of C2-C3, and C4-C5, right greater than left on a degenerative basis. Prominent Schmorl's node in superior endplate of T2 is unchanged. Soft tissues and spinal canal: No prevertebral fluid or swelling. No visible canal hematoma. Disc levels: Diffuse disc space narrowing and endplate spurring. Multilevel facet arthropathy. Upper chest: No acute abnormality. Other: Carotid calcifications. IMPRESSION: 1. Left parietal scalp hematoma. No skull fracture or acute intracranial abnormality. 2. Multilevel degenerative change in the cervical spine without acute fracture or subluxation. Electronically Signed   By: Jeb Levering M.D.   On: 07/07/2017 20:45   Ct Abdomen Pelvis W Contrast  Result Date: 07/08/2017 CLINICAL DATA:  Fall, left-sided chest and abdominal pain EXAM: CT CHEST, ABDOMEN, AND PELVIS WITH CONTRAST TECHNIQUE: Multidetector CT imaging of the chest, abdomen and pelvis was performed following the standard protocol during bolus administration of intravenous contrast. CONTRAST:  10mL ISOVUE-300 IOPAMIDOL (ISOVUE-300) INJECTION 61% COMPARISON:   02/28/2016, 03/31/2015, 11/13/2003 FINDINGS: CT CHEST FINDINGS Cardiovascular: Aortic atherosclerosis. No aneurysm is seen. Coronary artery calcification. Nonenlarged heart. No pericardial effusion. Mediastinum/Nodes: Midline trachea. No thyroid mass. Stable mediastinal lymph nodes. No mediastinal hematoma. Esophagus unremarkable. Lungs/Pleura: Innumerable bilateral pulmonary nodules. 16 mm right middle lobe pulmonary nodule, series 4, image number 104 stable. Right lower lobe pulmonary nodule measuring 18 mm, series 4, image number 17. Multiple additional pulmonary nodules grossly unchanged. No pneumothorax. No pleural effusion. Musculoskeletal: No acute or suspicious bone lesion. CT ABDOMEN PELVIS FINDINGS Hepatobiliary: No focal liver abnormality is seen. Status post cholecystectomy. No biliary dilatation. Pancreas: Unremarkable. No pancreatic ductal dilatation or surrounding inflammatory changes. Spleen: Normal in size without focal abnormality. Adrenals/Urinary Tract: Adrenal glands are unremarkable. Kidneys show no perinephric hematoma or hydronephrosis. Stable 12 mm low-density lesion mid right kidney. Stable 10 mm exophytic lesion mid to lower left kidney since 2017. Bladder unremarkable. Stomach/Bowel: The stomach is nonenlarged. No dilated small bowel. Sigmoid colon diverticular disease. Mild wall thickening and surrounding inflammation in the left lower quadrant around the sigmoid colon. Vascular/Lymphatic: Aortic atherosclerosis. subcentimeter retroperitoneal lymph nodes. Reproductive: Status post hysterectomy. No adnexal masses. Other: No free air or free fluid. Musculoskeletal: No acute or suspicious bone lesion. IMPRESSION: 1. Negative for acute thoracic injury. Negative for acute solid organ injury, free air or free fluid 2. Mild focal wall thickening and surrounding inflammation at the sigmoid colon suspicious for an acute diverticulitis. No perforation or abscess. 3. Stable multiple pulmonary  nodules 4. 10 mm exophytic indeterminate left renal lesion, stable since 2017. Electronically Signed   By: Donavan Foil M.D.   On: 07/08/2017 03:39   Dg Hip Unilat  With Pelvis 2-3 Views Right  Result Date: 07/07/2017 CLINICAL DATA:  Fall with pain into the right posterior hip region EXAM: DG HIP (WITH OR WITHOUT PELVIS) 2-3V RIGHT COMPARISON:  01/02/2017 FINDINGS: Mild arthritis of the hips. Pubic symphysis is intact. Mild SI joint disease. No fracture or malalignment. IMPRESSION: No definite acute osseous abnormality. Electronically Signed   By: Donavan Foil M.D.   On: 07/07/2017 23:50      Subjective: - no chest pain, shortness of breath, no abdominal pain, nausea or vomiting.   Discharge Exam: Vitals:   07/09/17 0500 07/09/17 1350  BP: 96/61 133/75  Pulse: 82 92  Resp: 18 17  Temp:  (!) 97.4 F (36.3 C)   Vitals:   07/08/17 2130 07/09/17 0455 07/09/17 0500 07/09/17 1350  BP: (!) 101/52  96/61 133/75  Pulse: 90  82 92  Resp: (!) 21  18 17   Temp:  98.6 F (37 C)  (!) 97.4 F (36.3 C)  TempSrc:  Oral  Oral  SpO2: 96%  94% 97%  Weight:      Height:        General: Pt is alert, awake, not in acute distress Cardiovascular: RRR, S1/S2 +, no rubs, no gallops Respiratory: CTA bilaterally, no wheezing, no rhonchi Abdominal: Soft, NT, ND, bowel sounds + Extremities: no edema, no cyanosis    The results of significant diagnostics from this hospitalization (including imaging, microbiology, ancillary and laboratory) are listed below for reference.     Microbiology: No results found for this or any previous visit (from the past 240 hour(s)).   Labs: BNP (last 3 results)  Recent Labs  12/12/16 2048  BNP 163.8*   Basic Metabolic Panel:  Recent Labs Lab 07/08/17 0127 07/08/17 0617  NA 136 139  K 3.8 3.7  CL 104 108  CO2 22 23  GLUCOSE 211* 179*  BUN 21* 15  CREATININE 1.49* 1.21*  CALCIUM 8.4* 7.8*   Liver Function Tests:  Recent Labs Lab 07/08/17 0127  07/08/17 0617  AST 15 15  ALT 12* 11*  ALKPHOS 72 64  BILITOT 0.7 0.7  PROT 5.7* 5.4*  ALBUMIN 3.5 3.1*   No results for input(s): LIPASE, AMYLASE in the last 168 hours. No results for input(s): AMMONIA in the last 168 hours. CBC:  Recent Labs Lab 07/08/17 0127 07/08/17 0617  WBC 5.9 4.4  NEUTROABS 2.6  --   HGB 11.3* 10.6*  HCT 33.5* 32.0*  MCV 88.2 88.6  PLT 104* 94*   Cardiac Enzymes:  Recent Labs Lab 07/08/17 0516 07/08/17 0929 07/08/17 1837  TROPONINI <0.03 <0.03 <0.03   BNP: Invalid input(s): POCBNP CBG:  Recent Labs Lab 07/08/17 1211 07/08/17 1622 07/08/17 2318 07/09/17 0633 07/09/17 1148  GLUCAP 227* 161* 245* 180* 197*   D-Dimer No results for input(s): DDIMER in the last 72 hours. Hgb A1c No results for input(s): HGBA1C in the last 72 hours. Lipid Profile No results for input(s): CHOL, HDL, LDLCALC, TRIG, CHOLHDL, LDLDIRECT in the last 72 hours. Thyroid function studies No results for input(s): TSH, T4TOTAL, T3FREE, THYROIDAB in the last 72 hours.  Invalid input(s): FREET3 Anemia work up No results for input(s): VITAMINB12, FOLATE, FERRITIN, TIBC, IRON, RETICCTPCT in the last 72 hours. Urinalysis    Component Value Date/Time   COLORURINE YELLOW 07/08/2017 0306  APPEARANCEUR CLEAR 07/08/2017 0306   LABSPEC 1.024 07/08/2017 0306   PHURINE 5.0 07/08/2017 0306   GLUCOSEU NEGATIVE 07/08/2017 0306   HGBUR NEGATIVE 07/08/2017 0306   BILIRUBINUR NEGATIVE 07/08/2017 0306   KETONESUR NEGATIVE 07/08/2017 0306   PROTEINUR NEGATIVE 07/08/2017 0306   UROBILINOGEN 1.0 04/17/2015 1838   NITRITE NEGATIVE 07/08/2017 0306   LEUKOCYTESUR TRACE (A) 07/08/2017 0306   Sepsis Labs Invalid input(s): PROCALCITONIN,  WBC,  LACTICIDVEN Microbiology No results found for this or any previous visit (from the past 240 hour(s)).   Time coordinating discharge: 25 minutes  SIGNED:  Marzetta Board, MD  [facility] 07/09/2017, 4:56 PM Pager  [phone]  If 7PM-7AM, please contact night-coverage [email or web address] Password [email or web address]

## 2017-07-09 NOTE — Progress Notes (Signed)
Physical Therapy Treatment Patient Details Name: [name] MRN: [id number] DOB: 03/09/1934 Today's Date: 07/09/2017    History of Present Illness Pt is a 83 yo female with c/o of posterior head pain radiating to left ear and eye as well as R hip pain after mechanical fall in bath tub.  PMH for CAD, DM, HTN, CVA, A-fib on Xarelto, and lung cancer.    PT Comments    Pt is making good progress towards her goals. Pt is currently supervision for bed mobility, transfers and ambulation of 75 feet with RW, and min guard for ascent/descent of 10 steps with bilateral rails. Pt is much steadier with gait and has better endurance than yesterday. Pt able to ascend 10 steps safely but experiences 3/4 DoE and increase in HR to 143 bpm. Pt reports that the climbing the stairs to get in the house typically take her some time to catch her breath. PT suggested possibility of getting a ramp placed to make it easier for her and her husband in the future. PT will benefit from HHPT to improve her LE strength to aid in climbing stairs to safely get in and out of her home.     Follow Up Recommendations  Home health PT;Supervision/Assistance - 24 hour     Equipment Recommendations  None recommended by PT    Recommendations for Other Services       Precautions / Restrictions Precautions Precautions: Fall Restrictions Weight Bearing Restrictions: No    Mobility  Bed Mobility Overal bed mobility: Needs Assistance Bed Mobility: Supine to Sit     Supine to sit: Supervision;HOB elevated     General bed mobility comments: supervision for safety, pt used bedrails to pull to EoB  Transfers Overall transfer level: Needs assistance Equipment used: Rolling walker (2 wheeled) Transfers: Sit to/from Stand Sit to Stand: Supervision         General transfer comment: pt with good hand placement and powerup  Ambulation/Gait Ambulation/Gait assistance: Supervision Ambulation Distance (Feet): 75  Feet Assistive device: Rolling walker (2 wheeled) Gait Pattern/deviations: Shuffle;Trunk flexed;Step-through pattern Gait velocity: slowed Gait velocity interpretation: Below normal speed for age/gender General Gait Details: supervision for safety, pt with better endurance today and no complaints of L LE pain   Stairs Stairs: Yes   Stair Management: Two rails;Alternating pattern;Forwards Number of Stairs: 10 General stair comments: pt with slow, steady ascent/descent of 10 stairs however after ascent of stairs pt with 3/4 DoE, HR 143 bpm, SaO2 on RA 97%O2, pt requires 2 minute standing rest break to descend stairs         Balance Overall balance assessment: Needs assistance Sitting-balance support: No upper extremity supported;Feet supported Sitting balance-Leahy Scale: Good     Standing balance support: No upper extremity supported;During functional activity Standing balance-Leahy Scale: Good Standing balance comment: able to attain standing balance before reaching for walker                            Cognition Arousal/Alertness: Awake/alert Behavior During Therapy: WFL for tasks assessed/performed Overall Cognitive Status: Within Functional Limits for tasks assessed                                           General Comments General comments (skin integrity, edema, etc.): after ambulation HR 113 bpm, and SaO2 on RA 93%  Pertinent Vitals/Pain Pain Assessment: 0-10 Pain Score: 7  Pain Location: L side of head and eye Pain Descriptors / Indicators: Throbbing;Guarding;Constant Pain Intervention(s): Monitored during session;Limited activity within patient's tolerance           PT Goals (current goals can now be found in the care plan section) Acute Rehab PT Goals Patient Stated Goal: go home PT Goal Formulation: With patient Time For Goal Achievement: 07/15/17 Potential to Achieve Goals: Fair Progress towards PT goals: Progressing  toward goals    Frequency    Min 3X/week      PT Plan Current plan remains appropriate       AM-PAC PT "6 Clicks" Daily Activity  Outcome Measure  Difficulty turning over in bed (including adjusting bedclothes, sheets and blankets)?: A Lot Difficulty moving from lying on back to sitting on the side of the bed? : A Lot Difficulty sitting down on and standing up from a chair with arms (e.g., wheelchair, bedside commode, etc,.)?: A Little Help needed moving to and from a bed to chair (including a wheelchair)?: A Little Help needed walking in hospital room?: A Little Help needed climbing 3-5 steps with a railing? : A Little 6 Click Score: 16    End of Session Equipment Utilized During Treatment: Gait belt Activity Tolerance: Patient limited by fatigue;Patient limited by pain Patient left: in chair;with call bell/phone within reach;with family/visitor present Nurse Communication: Mobility status PT Visit Diagnosis: Unsteadiness on feet (R26.81);Other abnormalities of gait and mobility (R26.89);Muscle weakness (generalized) (M62.81);History of falling (Z91.81);Difficulty in walking, not elsewhere classified (R26.2);Pain Pain - part of body:  (head and eye)     Time: 8768-1157 PT Time Calculation (min) (ACUTE ONLY): 21 min  Charges:  $Gait Training: 8-22 mins                    G Codes:  Functional Assessment Tool Used: AM-PAC 6 Clicks Basic Mobility Functional Limitation: Mobility: Walking and moving around Mobility: Walking and Moving Around Current Status (W6203): At least 40 percent but less than 60 percent impaired, limited or restricted Mobility: Walking and Moving Around Goal Status [phone]): At least 1 percent but less than 20 percent impaired, limited or restricted    Benjamine Mola B. Migdalia Dk PT, DPT Acute Rehabilitation  [phone] Pager [phone]     [place] 07/09/2017, 3:11 PM

## 2017-07-09 NOTE — Progress Notes (Signed)
  Echocardiogram 2D Echocardiogram has been performed.  Jennette Dubin 07/09/2017, 9:55 AM

## 2017-07-09 NOTE — Progress Notes (Signed)
Discharged to home with spouse. Physical Therapy worked with patient before discharge and recommended HH. A cane was delivered to room before discharge. HH has been set up by CM.; verbalized understanding of all written and verbal discharge instructions. Prescriptions given for two antibiotics. Left unit via wheelchair, accompanied by spouse and patient care technician.

## 2017-07-09 NOTE — Care Management Note (Signed)
Case Management Note Marvetta Gibbons RN, BSN Unit 4E-Case Manager [phone]  Patient Details  Name: [name] MRN: [id number] Date of Birth: 1934/02/07  Subjective/Objective:   Pt admitted s/p fall at home with chest pain                 Action/Plan: PTA pt lived at home with spouse- plan to return home- per PT eval recommendation for [facility] order has been placed for HHPT and cane- spoke with pt at bedside- choice offered for [facility] services in [place]- per pt she would like to use [facility]- has used them in past- referral called to [facility] with [facility]- referral accepted for HHPT- call also made to Santiago Glad with [facility] for DME-cane- which will be delivered to room prior to discharge.   Expected Discharge Date:  07/09/17               Expected Discharge Plan:  [place]  In-House Referral:  NA  Discharge planning Services  CM Consult  Post Acute Care Choice:  Home Health, Durable Medical Equipment Choice offered to:  Patient  DME Arranged:  Kasandra Knudsen DME Agency:  [place] Arranged:  PT [facility] Agency:  [place]  Status of Service:  Completed, signed off  If discussed at [place] of Stay Meetings, dates discussed:    Discharge Disposition: home with home health   Additional Comments:  Dawayne Patricia, RN 07/09/2017, 2:48 PM

## 2018-02-08 ENCOUNTER — Other Ambulatory Visit: Payer: Self-pay | Admitting: Family Medicine

## 2018-02-08 DIAGNOSIS — Z1231 Encounter for screening mammogram for malignant neoplasm of breast: Secondary | ICD-10-CM

## 2018-02-22 IMAGING — CT CT ANGIO CHEST
3 of 8 series · 16 of 46 positions shown · IV contrast (OMNI)
Comparison: Chest radiograph yesterday. Chest CT 04/18/2015,
03/13/2012

CLINICAL DATA: Syncope and shortness of breath.  Elevated D-dimer.

EXAM:
CT ANGIOGRAPHY CHEST WITH CONTRAST
TECHNIQUE: Multidetector CT imaging of the chest was performed using the
standard protocol during bolus administration of intravenous
contrast. Multiplanar CT image reconstructions and MIPs were
obtained to evaluate the vascular anatomy.
CONTRAST:  100 mL Isovue 370 IV

[Series 4: pe 3.0 i30f 1 · axial · 0.64mm/px · z∈[-196,-19]mm · 5 of 89 slices shown]
[im 15/89  lung]
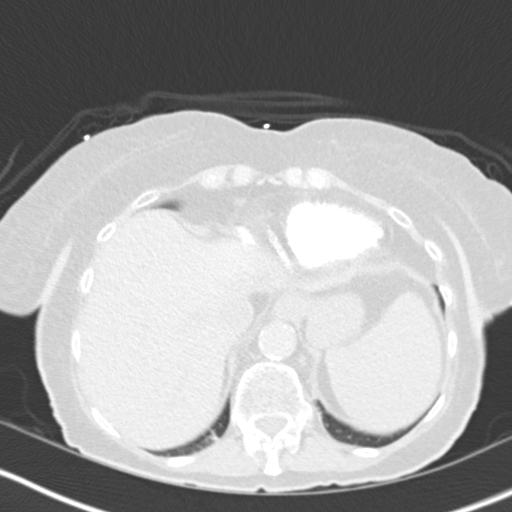
[im 30/89  soft-tissue]
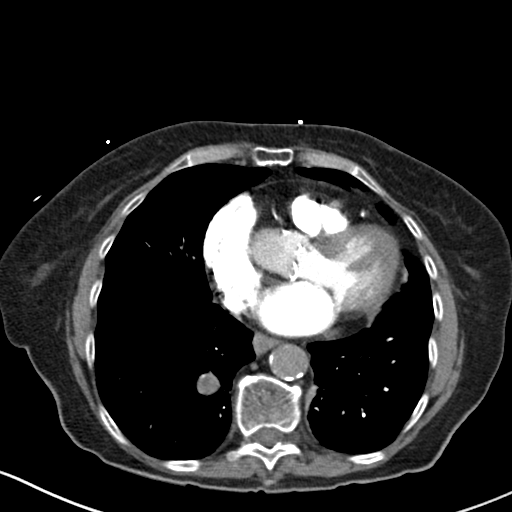
[im 45/89  lung]
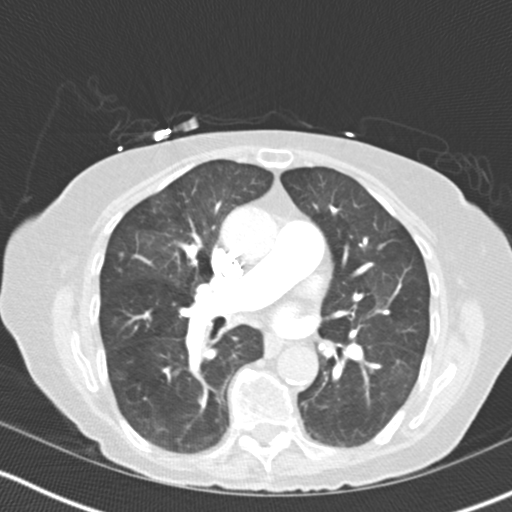
[im 59/89  soft-tissue]
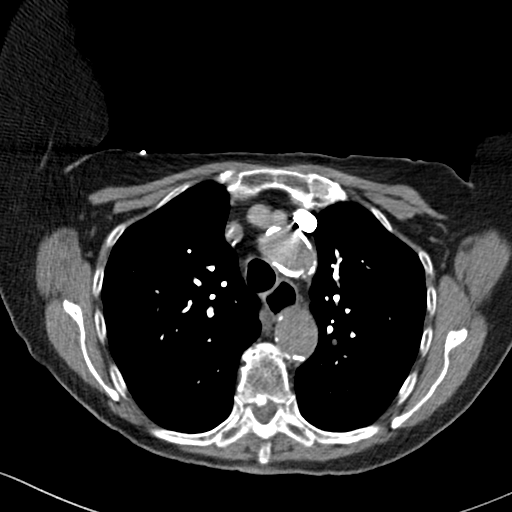
[im 74/89  lung]
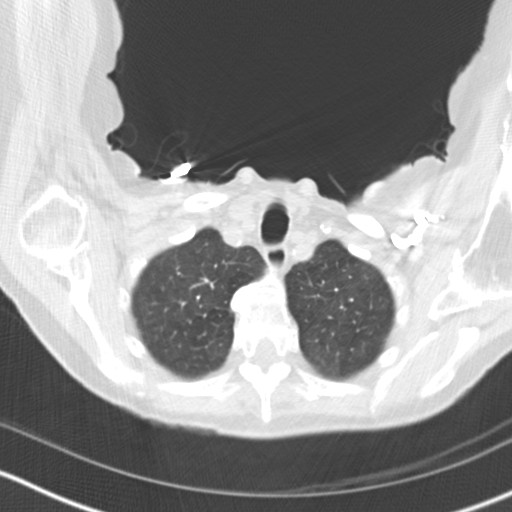

[Series 5: thins · axial · 0.64mm/px · z∈[-204,+3]mm · 8 of 254 slices shown]
[im 24/254  lung]
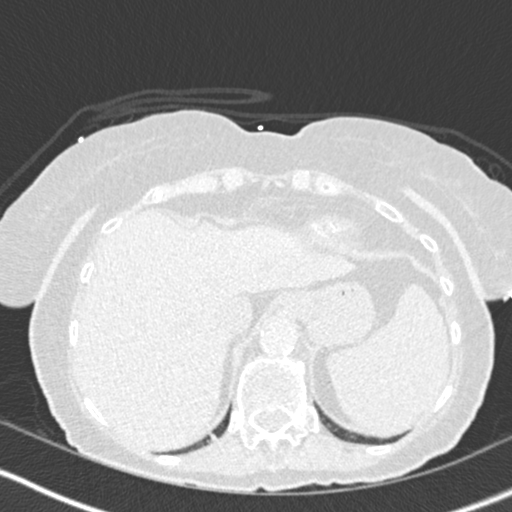
[im 47/254  lung]
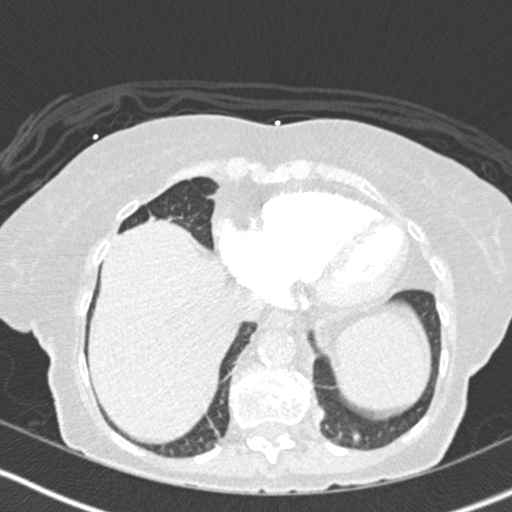
[im 81/254  lung]
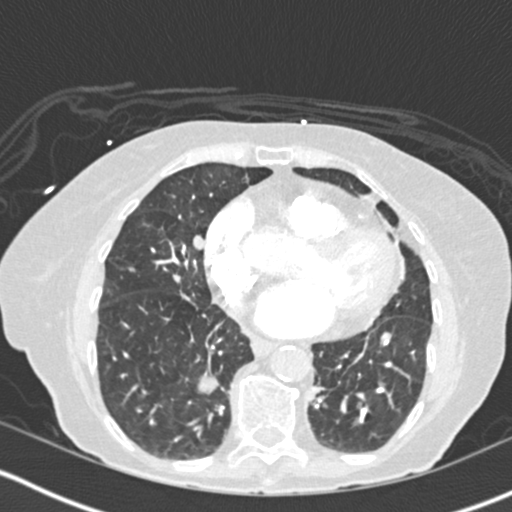
[im 116/254  lung]
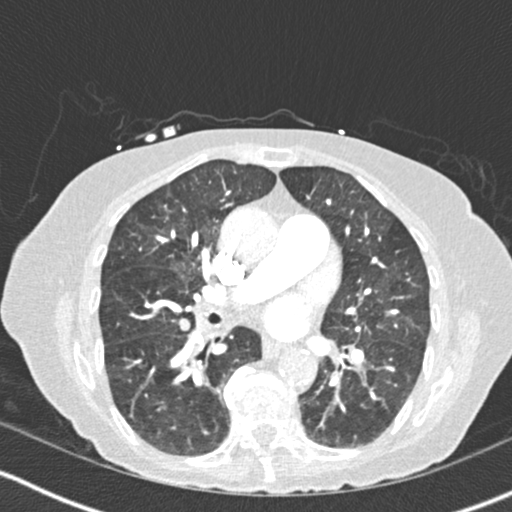
[im 139/254  lung]
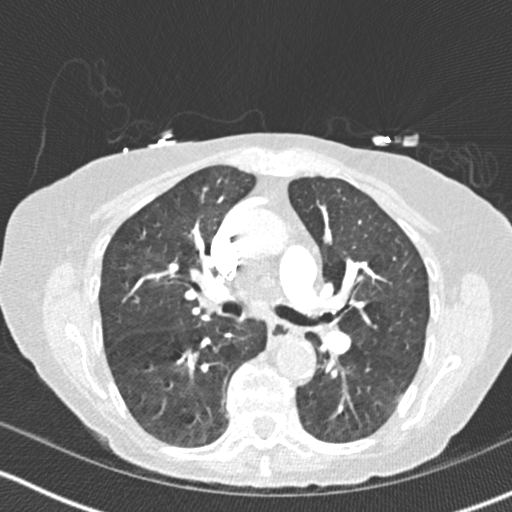
[im 173/254  lung]
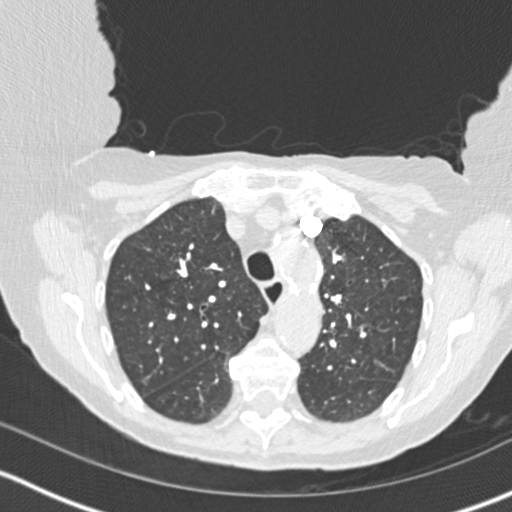
[im 208/254  lung]
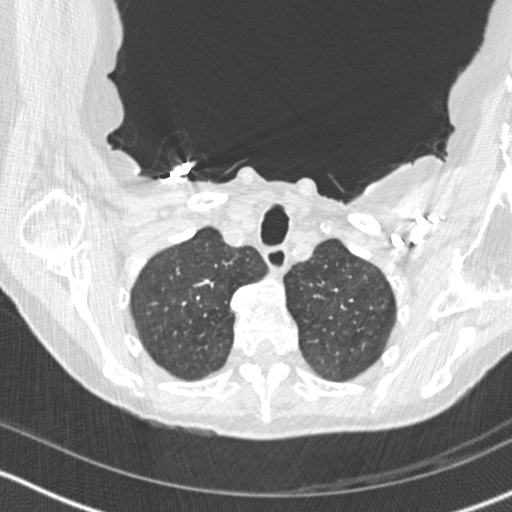
[im 231/254  lung]
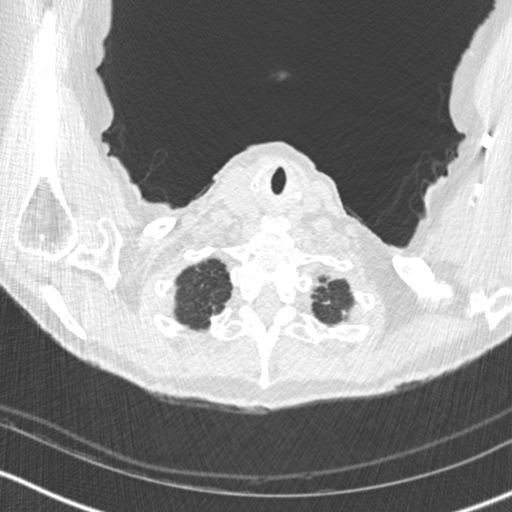

[Series 7: coronal mpr · coronal · 0.59mm/px · 3 of 115 slices shown]
[im 29/115  soft-tissue]
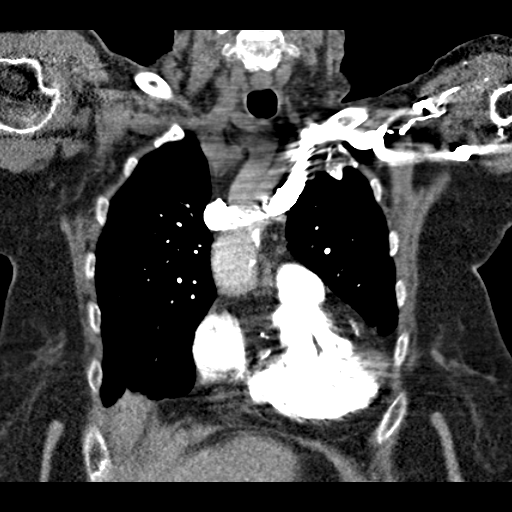
[im 58/115  soft-tissue]
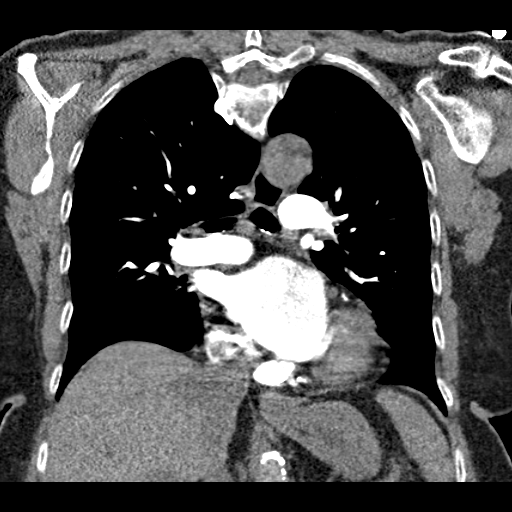
[im 86/115  soft-tissue]
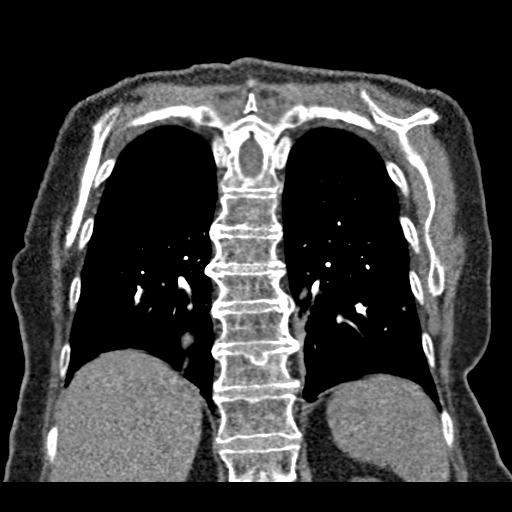

[16 of 46 positions shown; findings below may reference images not displayed]

FINDINGS: Mediastinum/Lymph Nodes: No pulmonary emboli. Tortuous
atherosclerotic aorta without aneurysm. Cannot assess for dissection
given phase of contrast. No masses or pathologically enlarged lymph
nodes identified. No pericardial effusion. Coronary artery
calcifications.

Lungs/Pleura: Mild emphysema. No consolidation. Multiple pulmonary
nodules. Right middle lobe pulmonary nodule measures 1.4 x 1.1 cm
image 62 series 6 unchanged adjacent 9 mm right middle lobe nodule
image 58. Dominant right lower lobe nodule measures 1.7 x 1.6 cm
image 62. Additional smaller right lower lobe pulmonary nodules are
stable. Multiple small left lower lobe pulmonary nodules are
unchanged. No new nodule. No pleural effusion.

Upper abdomen: No acute findings.

Musculoskeletal: No chest wall mass or suspicious bone lesions
identified. Prominent Schmorl's node lower thoracic vertebra.

Review of the MIP images confirms the above findings.
IMPRESSION: 1. No pulmonary embolus.
2. No acute process.
3. Multiple bilateral pulmonary nodules, unchanged dating back to
2619 and considered benign.
4. Atherosclerosis and coronary artery calcifications.

## 2018-02-24 ENCOUNTER — Ambulatory Visit
Admission: RE | Admit: 2018-02-24 | Discharge: 2018-02-24 | Disposition: A | Discharge status: Home / Self Care | Payer: Medicare Other | Source: Ambulatory Visit | Attending: Family Medicine | Admitting: Family Medicine

## 2018-02-24 DIAGNOSIS — Z1231 Encounter for screening mammogram for malignant neoplasm of breast: Secondary | ICD-10-CM

## 2018-05-05 ENCOUNTER — Other Ambulatory Visit: Payer: Self-pay

## 2018-05-05 ENCOUNTER — Encounter (HOSPITAL_COMMUNITY): Payer: Self-pay

## 2018-05-05 ENCOUNTER — Emergency Department (HOSPITAL_COMMUNITY): Payer: Medicare Other

## 2018-05-05 ENCOUNTER — Emergency Department (HOSPITAL_COMMUNITY)
Admission: EM | Admit: 2018-05-05 | Discharge: 2018-05-05 | Disposition: A | Discharge status: Home / Self Care | Payer: Medicare Other | Attending: Emergency Medicine | Admitting: Emergency Medicine

## 2018-05-05 DIAGNOSIS — M79605 Pain in left leg: Secondary | ICD-10-CM | POA: Insufficient documentation

## 2018-05-05 DIAGNOSIS — E039 Hypothyroidism, unspecified: Secondary | ICD-10-CM | POA: Diagnosis not present

## 2018-05-05 DIAGNOSIS — I4891 Unspecified atrial fibrillation: Secondary | ICD-10-CM | POA: Diagnosis not present

## 2018-05-05 DIAGNOSIS — M25552 Pain in left hip: Secondary | ICD-10-CM | POA: Insufficient documentation

## 2018-05-05 DIAGNOSIS — Z7901 Long term (current) use of anticoagulants: Secondary | ICD-10-CM | POA: Diagnosis not present

## 2018-05-05 DIAGNOSIS — Z79899 Other long term (current) drug therapy: Secondary | ICD-10-CM | POA: Diagnosis not present

## 2018-05-05 DIAGNOSIS — Z8673 Personal history of transient ischemic attack (TIA), and cerebral infarction without residual deficits: Secondary | ICD-10-CM | POA: Diagnosis not present

## 2018-05-05 DIAGNOSIS — E119 Type 2 diabetes mellitus without complications: Secondary | ICD-10-CM | POA: Insufficient documentation

## 2018-05-05 DIAGNOSIS — I2510 Atherosclerotic heart disease of native coronary artery without angina pectoris: Secondary | ICD-10-CM | POA: Diagnosis not present

## 2018-05-05 NOTE — ED Notes (Signed)
Patient transported to X-ray 

## 2018-05-05 NOTE — ED Notes (Signed)
Ambulated to bathroom without difficulty

## 2018-05-05 NOTE — Discharge Instructions (Signed)
Please stop taking ibuprofen and switch back to Tylenol.  Please use caution at home to avoid further falls.  Please follow-up with Dr. Rex Kras this week.

## 2018-05-05 NOTE — ED Notes (Signed)
Patient transported to CT 

## 2018-05-05 NOTE — ED Provider Notes (Signed)
[facility] EMERGENCY DEPARTMENT Provider Note   CSN: [id number] Arrival date & time: 05/05/18  0630     History   Chief Complaint Chief Complaint  Patient presents with  . Leg Pain  . Fall    HPI [name] is a 84 y.o. female with a past medical history of CAD, DM, hypertension, stroke, lung cancer, who presents today for evaluation of left leg pain.  She reports that approximately 3 weeks ago she was sitting on a three-foot stepladder when she fell off landing on her bottom.  She denies striking her head or loss of consciousness.  She reports that she has been "attempting to Dr. it" herself at home, and has not had any x-rays.  She is concerned that her pain is persisting.  She denies any numbness, weakness, or tingling in the leg.  She has been taking ibuprofen at home for her pain.    HPI  Past Medical History:  Diagnosis Date  . Acute diverticulitis 06/2017  . Anemia 06/2017  . ARF (acute renal failure) ([place]) 06/2017  . CAD (coronary artery disease) 03/13/2012  . Diabetes mellitus   . Dysrhythmia    ATRIAL FIB  . Fall 07/07/2017  . HTN (hypertension) 03/13/2012  . Hypothyroid 03/13/2012  . Lung cancer ([place])   . Shortness of breath   . Stroke ([place])   . Syncope and collapse 04/02/2016  . Type II or unspecified type diabetes mellitus without mention of complication, not stated as uncontrolled 03/13/2012    Patient Active Problem List   Diagnosis Date Noted  . Chest pain 07/08/2017  . ARF (acute renal failure) ([place]) 07/08/2017  . Acute diverticulitis 07/08/2017  . Head injury   . Right hip pain 01/03/2017  . Muscle strain 01/03/2017  . Atrial fibrillation with rapid ventricular response ([place]) 12/12/2016  . Atrial fibrillation with RVR ([place])   . Diabetes mellitus with complication ([place])   . Faintness   . Thrombocytopenia ([place]) 04/07/2016  . Anemia 04/07/2016  . SOB (shortness of breath) on exertion   . Syncope and collapse   . Syncope  04/02/2016  . Physical deconditioning 04/20/2015  . Dyspnea 04/17/2015  . SOB (shortness of breath) 04/17/2015  . Acute renal failure superimposed on stage 3 chronic kidney disease ([place]) 04/17/2015  . Hypokalemia 04/17/2015  . Atrial fibrillation (HCC) CHA2DS2-VASc Score 7 03/13/2012  . HTN (hypertension) 03/13/2012  . Hypothyroid 03/13/2012  . DM (diabetes mellitus), type 2, uncontrolled, with renal complications ([place]) 66/59/9357  . CAD (coronary artery disease) 03/13/2012  . Carcinoid tumor of lung   . Stroke [facility])     Past Surgical History:  Procedure Laterality Date  . ABDOMINAL HYSTERECTOMY    . BREAST EXCISIONAL BIOPSY Left   . BREAST SURGERY     benign tumors removed in 1980  . CARDIAC CATHETERIZATION    . CARDIOVERSION  04/20/2012   Procedure: CARDIOVERSION;  Surgeon: Laverda Page, MD;  Location: [facility];  Service: Cardiovascular;  Laterality: N/A;  . CARDIOVERSION N/A 05/10/2013   Procedure: CARDIOVERSION;  Surgeon: Laverda Page, MD;  Location: [facility];  Service: Cardiovascular;  Laterality: N/A;  . CHOLECYSTECTOMY    . HEMORRHOID SURGERY    . jaw replacement    . LEFT HEART CATHETERIZATION WITH CORONARY ANGIOGRAM N/A 04/20/2015   Procedure: LEFT HEART CATHETERIZATION WITH CORONARY ANGIOGRAM;  Surgeon: Adrian Prows, MD;  Location: [facility] CATH LAB;  Service: Cardiovascular;  Laterality: N/A;  . tumors  back of head     OB History   None      Home Medications    Prior to Admission medications   Medication Sig Start Date End Date Taking? Authorizing Provider  acetaminophen (TYLENOL) 500 MG tablet Take 500 mg by mouth every 6 (six) hours as needed for moderate pain or headache.     [provider]  ALPRAZolam Duanne Moron) 0.25 MG tablet Take 1 tablet (0.25 mg total) by mouth at bedtime. 12/17/16   Dixie Dials, MD  amiodarone (PACERONE) 200 MG tablet Take 1 tablet (200 mg total) by mouth daily. 12/18/16   Dixie Dials, MD  Calcium Carb-Cholecalciferol  (CALCIUM 600-D PO) Take 600 mg by mouth daily.    [provider]  clobetasol cream (TEMOVATE) 6.83 % Apply 1 application topically 2 (two) times daily as needed (itching/ do not use on face).    [provider]  colesevelam (WELCHOL) 625 MG tablet Take 312.5-625 mg by mouth daily as needed (diarrhea).    [provider]  diltiazem (CARDIZEM CD) 240 MG 24 hr capsule Take 1 capsule (240 mg total) by mouth daily. 12/18/16   Dixie Dials, MD  Insulin Detemir (LEVEMIR) 100 UNIT/ML Pen Inject 20 Units into the skin daily at 10 pm. Patient taking differently: Inject 20 Units into the skin every morning.  12/17/16   Dixie Dials, MD  levothyroxine (SYNTHROID, LEVOTHROID) 100 MCG tablet Take 1 tablet (100 mcg total) by mouth daily before breakfast. Patient taking differently: Take 150 mcg by mouth daily before breakfast.  12/17/16   Dixie Dials, MD  meclizine (ANTIVERT) 25 MG tablet Take 25 mg by mouth 4 (four) times daily as needed for dizziness.    [provider]  metoprolol (LOPRESSOR) 50 MG tablet Take 50 mg by mouth 2 (two) times daily.    [provider]  mupirocin ointment (BACTROBAN) 2 % Apply 1 application topically daily as needed (itching).     [provider]  pantoprazole (PROTONIX) 40 MG tablet Take 1 tablet (40 mg total) by mouth daily. 12/18/16   Dixie Dials, MD  pramoxine (SARNA SENSITIVE) 1 % LOTN Apply 1 application topically 3 (three) times daily as needed (itching).    [provider]  Rivaroxaban (XARELTO) 15 MG TABS tablet Take 15 mg by mouth daily with supper. 03/16/12   Adrian Prows, MD    Family History Family History  Problem Relation Age of Onset  . Breast cancer Mother   . Aneurysm Father        Likely thoracic aneurysm per patient  . Melanoma Brother   . Heart disease Sister   . Diabetes type II Sister     Social History Social History   Tobacco Use  . Smoking status: Never Smoker  . Smokeless  tobacco: Never Used  Substance Use Topics  . Alcohol use: No  . Drug use: No     Allergies   Codeine and Hydrocodone-acetaminophen   Review of Systems Review of Systems  Constitutional: Negative for chills and fever.  HENT: Negative for ear pain and sore throat.   Eyes: Negative for pain and visual disturbance.  Respiratory: Negative for cough and shortness of breath.   Cardiovascular: Negative for chest pain and palpitations.  Gastrointestinal: Negative for abdominal pain and vomiting.  Genitourinary: Negative for dysuria and hematuria.  Musculoskeletal: Positive for back pain. Negative for arthralgias.       Pain in left leg from hip to knee.  Skin: Negative for color  change and rash.  Neurological: Negative for seizures and syncope.  All other systems reviewed and are negative.    Physical Exam Updated Vital Signs BP (!) 148/82 (BP Location: Right Arm)   Pulse 80   Temp 98 F (36.7 C) (Oral)   Resp 18   SpO2 99%   Physical Exam  Constitutional: She appears well-developed and well-nourished. No distress.  HENT:  Head: Normocephalic and atraumatic.  Eyes: Conjunctivae are normal.  Neck: Neck supple.  Cardiovascular: Normal rate, regular rhythm and intact distal pulses.  No murmur heard. 2+ DP/PT pulses bilaterally. Bilateral legs are without edema.  Pulmonary/Chest: Effort normal and breath sounds normal. No respiratory distress.  Abdominal: Soft. There is no tenderness.  Musculoskeletal: She exhibits no edema or deformity.  Patient has 5/5 strength in bilateral lower extremities through ankle dorsiflexion and plantar flexion. Left leg: There is no pain, tenderness, or deformities over the leg below the knee.  There is diffuse tenderness in the left hip, worse on the outside, and on patient's buttocks.  Left hip pain is worsened with flexion, and external rotation.  No pain in left knee with range of motion.  No pain in left with range of motion.  Lumbar spine  has no midline tenderness to palpation.  Neurological: She is alert.  Sensation intact to bilateral lower extremities.  Skin: Skin is warm and dry.  Psychiatric: She has a normal mood and affect.  Nursing note and vitals reviewed.    ED Treatments / Results  Labs (all labs ordered are listed, but only abnormal results are displayed) Labs Reviewed - No data to display  EKG None  Radiology Dg Lumbar Spine Complete  Result Date: 05/05/2018 CLINICAL DATA:  84 year old female status post fall three weeks ago landing on buttocks. Left side lumbar back pain radiating to the coccyx, hip, and left leg. EXAM: LUMBAR SPINE - COMPLETE 4+ VIEW COMPARISON:  CT Abdomen and Pelvis 07/08/2017 and earlier. FINDINGS: Normal lumbar segmentation. Osteopenia peer stable since lumbar radiographs of 2016. Stable lumbar vertebral height and alignment with relatively preserved disc spacing spaces. Chronic anterior endplate spurring most pronounced at L4. Chronic superior endplate Schmorl's node at L3 is stable. No pars fracture. Mild to moderate lower lumbar facet hypertrophy from L3-S1 appears stable. The sacral ala and SI joints appear stable. No visible pelvis fracture. Chronic extensive Aortoiliac calcified atherosclerosis. Stable cholecystectomy clips. Negative visible bowel gas pattern. IMPRESSION: 1.  No acute osseous abnormality identified in the lumbar spine. 2.  Aortic Atherosclerosis (ICD10-I70.0). Electronically Signed   By: Genevie Ann M.D.   On: 05/05/2018 10:39   Dg Ankle Complete Left  Result Date: 05/05/2018 CLINICAL DATA:  Fall 3 weeks ago with persistent ankle pain, initial encounter EXAM: LEFT ANKLE COMPLETE - 3+ VIEW COMPARISON:  None. FINDINGS: There is no evidence of fracture, dislocation, or joint effusion. There is no evidence of arthropathy or other focal bone abnormality. Soft tissues are unremarkable. IMPRESSION: No acute abnormality is noted. Electronically Signed   By: Inez Catalina M.D.   On:  05/05/2018 07:38   Ct Pelvis Wo Contrast  Result Date: 05/05/2018 CLINICAL DATA:  Pelvic fracture, known or suspected. Fall 3 days ago with continued left hip pain EXAM: CT PELVIS WITHOUT CONTRAST TECHNIQUE: Multidetector CT imaging of the pelvis was performed following the standard protocol without intravenous contrast. COMPARISON:  07/08/2017 CT of the abdomen FINDINGS: Urinary Tract:  Negative Bowel:  Extensive distal colonic diverticulosis. Vascular/Lymphatic: Diffuse atherosclerotic calcification. No evidence of injury  Reproductive:  Hysterectomy with negative adnexae. Other:  Negative Musculoskeletal: Negative for hip fracture or dislocation. No pelvic ring fracture or diastasis. Subtle angulation of the lower sacrum on sagittal reformats is chronic when compared to July 2018 abdominal CT. No evidence of muscular or ligamentous injury. Lower lumbar facet degeneration and disc bulge with advanced spinal stenosis at L4-5. IMPRESSION: No evidence of injury.  Negative for fracture. Electronically Signed   By: Monte Fantasia M.D.   On: 05/05/2018 11:54   Dg Knee Complete 4 Views Left  Result Date: 05/05/2018 CLINICAL DATA:  Left leg pain following fall several weeks ago, initial encounter EXAM: LEFT KNEE - COMPLETE 4+ VIEW COMPARISON:  07/14/2013 FINDINGS: Distal calcifications are again identified. No findings to suggest acute fracture or dislocation are seen. No joint effusion is noted. IMPRESSION: No acute abnormality noted. Electronically Signed   By: Inez Catalina M.D.   On: 05/05/2018 07:37   Dg Hip Unilat With Pelvis 2-3 Views Left  Result Date: 05/05/2018 CLINICAL DATA:  Left leg pain following fall 3 weeks ago, initial encounter EXAM: DG HIP (WITH OR WITHOUT PELVIS) 2-3V LEFT COMPARISON:  07/07/2017 FINDINGS: There is no evidence of hip fracture or dislocation. There is no evidence of arthropathy or other focal bone abnormality. IMPRESSION: No acute abnormality noted. Electronically Signed   By:  Inez Catalina M.D.   On: 05/05/2018 07:37    Procedures Procedures (including critical care time)  Medications Ordered in ED Medications - No data to display   Initial Impression / Assessment and Plan / ED Course  I have reviewed the triage vital signs and the nursing notes.  Pertinent labs & imaging results that were available during my care of the patient were reviewed by me and considered in my medical decision making (see chart for details).    She presents today for evaluation of 3 weeks of left hip pain after she fell off a ladder 3 weeks ago landing on her buttocks.  Based on mechanism of injury plain films of left hip, pelvis, knee, and ankle were obtained prior to my seeing the patient, all of which came back normal.  Based on patient's age and continued pain concern for occult pelvic fracture.  Pain lumbar spine films were added based on mechanism of injury without acute abnormalities.  CT pelvis was obtained which did not show any occult fractures or acute processes.  Patient was informed that she does have extensive colonic diverticuli and states that she is aware of this is a known problem.  Return precautions were discussed with patient.  She is not having any bowel or bladder dysfunction, not concern for cauda equina syndrome.  PCP follow-up.  Recommended the patient switch from ibuprofen back to Tylenol as she is anticoagulated.  No other injuries.  Patient discharged home.  This patient was seen as a shared visit with Dr. Wilson Singer who evaluated the patient and agreed with my plan.   Final Clinical Impressions(s) / ED Diagnoses   Final diagnoses:  Left leg pain    ED Discharge Orders    None       Ollen Gross 05/06/18 1417    Virgel Manifold, MD 05/07/18 [phone]

## 2018-05-05 NOTE — ED Triage Notes (Signed)
Pt states that for the past three weeks her L leg has been hurting her from her hip to her knee after a mechanical fall.

## 2018-05-05 NOTE — ED Notes (Signed)
Pt verbalized understanding of discharge instructions and denies any further questions at this time.   

## 2018-05-05 NOTE — ED Notes (Signed)
ED Provider at bedside. 

## 2018-05-05 NOTE — ED Notes (Signed)
Patient returned from CT

## 2018-08-16 ENCOUNTER — Observation Stay (HOSPITAL_COMMUNITY)
Admission: EM | Admit: 2018-08-16 | Discharge: 2018-08-18 | Disposition: A | Discharge status: Home / Self Care | Payer: Medicare Other | Attending: Cardiology | Admitting: Cardiology

## 2018-08-16 ENCOUNTER — Emergency Department (HOSPITAL_COMMUNITY): Payer: Medicare Other

## 2018-08-16 ENCOUNTER — Encounter (HOSPITAL_COMMUNITY): Payer: Self-pay

## 2018-08-16 ENCOUNTER — Other Ambulatory Visit: Payer: Self-pay

## 2018-08-16 DIAGNOSIS — Z7901 Long term (current) use of anticoagulants: Secondary | ICD-10-CM | POA: Diagnosis not present

## 2018-08-16 DIAGNOSIS — N179 Acute kidney failure, unspecified: Secondary | ICD-10-CM | POA: Diagnosis not present

## 2018-08-16 DIAGNOSIS — Z9889 Other specified postprocedural states: Secondary | ICD-10-CM | POA: Diagnosis not present

## 2018-08-16 DIAGNOSIS — J984 Other disorders of lung: Secondary | ICD-10-CM | POA: Diagnosis not present

## 2018-08-16 DIAGNOSIS — R0789 Other chest pain: Secondary | ICD-10-CM | POA: Diagnosis present

## 2018-08-16 DIAGNOSIS — E039 Hypothyroidism, unspecified: Secondary | ICD-10-CM | POA: Diagnosis not present

## 2018-08-16 DIAGNOSIS — Z885 Allergy status to narcotic agent status: Secondary | ICD-10-CM | POA: Insufficient documentation

## 2018-08-16 DIAGNOSIS — M6281 Muscle weakness (generalized): Secondary | ICD-10-CM | POA: Insufficient documentation

## 2018-08-16 DIAGNOSIS — R0602 Shortness of breath: Secondary | ICD-10-CM | POA: Insufficient documentation

## 2018-08-16 DIAGNOSIS — E785 Hyperlipidemia, unspecified: Secondary | ICD-10-CM | POA: Diagnosis not present

## 2018-08-16 DIAGNOSIS — I5032 Chronic diastolic (congestive) heart failure: Secondary | ICD-10-CM | POA: Diagnosis not present

## 2018-08-16 DIAGNOSIS — E1122 Type 2 diabetes mellitus with diabetic chronic kidney disease: Secondary | ICD-10-CM | POA: Diagnosis not present

## 2018-08-16 DIAGNOSIS — Z955 Presence of coronary angioplasty implant and graft: Secondary | ICD-10-CM | POA: Insufficient documentation

## 2018-08-16 DIAGNOSIS — N183 Chronic kidney disease, stage 3 (moderate): Secondary | ICD-10-CM | POA: Diagnosis not present

## 2018-08-16 DIAGNOSIS — I482 Chronic atrial fibrillation, unspecified: Secondary | ICD-10-CM | POA: Diagnosis present

## 2018-08-16 DIAGNOSIS — Z803 Family history of malignant neoplasm of breast: Secondary | ICD-10-CM | POA: Insufficient documentation

## 2018-08-16 DIAGNOSIS — D638 Anemia in other chronic diseases classified elsewhere: Secondary | ICD-10-CM | POA: Diagnosis not present

## 2018-08-16 DIAGNOSIS — I130 Hypertensive heart and chronic kidney disease with heart failure and stage 1 through stage 4 chronic kidney disease, or unspecified chronic kidney disease: Secondary | ICD-10-CM | POA: Diagnosis not present

## 2018-08-16 DIAGNOSIS — I272 Pulmonary hypertension, unspecified: Secondary | ICD-10-CM | POA: Diagnosis not present

## 2018-08-16 DIAGNOSIS — I200 Unstable angina: Secondary | ICD-10-CM

## 2018-08-16 DIAGNOSIS — Z8673 Personal history of transient ischemic attack (TIA), and cerebral infarction without residual deficits: Secondary | ICD-10-CM | POA: Diagnosis not present

## 2018-08-16 DIAGNOSIS — R2681 Unsteadiness on feet: Secondary | ICD-10-CM | POA: Insufficient documentation

## 2018-08-16 DIAGNOSIS — Z85118 Personal history of other malignant neoplasm of bronchus and lung: Secondary | ICD-10-CM | POA: Diagnosis not present

## 2018-08-16 DIAGNOSIS — Z90710 Acquired absence of both cervix and uterus: Secondary | ICD-10-CM | POA: Insufficient documentation

## 2018-08-16 DIAGNOSIS — Z833 Family history of diabetes mellitus: Secondary | ICD-10-CM | POA: Insufficient documentation

## 2018-08-16 DIAGNOSIS — D696 Thrombocytopenia, unspecified: Secondary | ICD-10-CM | POA: Diagnosis not present

## 2018-08-16 DIAGNOSIS — Z8249 Family history of ischemic heart disease and other diseases of the circulatory system: Secondary | ICD-10-CM | POA: Insufficient documentation

## 2018-08-16 DIAGNOSIS — Z808 Family history of malignant neoplasm of other organs or systems: Secondary | ICD-10-CM | POA: Insufficient documentation

## 2018-08-16 DIAGNOSIS — Z9049 Acquired absence of other specified parts of digestive tract: Secondary | ICD-10-CM | POA: Diagnosis not present

## 2018-08-16 DIAGNOSIS — I4891 Unspecified atrial fibrillation: Secondary | ICD-10-CM

## 2018-08-16 LAB — BASIC METABOLIC PANEL
ANION GAP: 8 (ref 5–15)
BUN: 9 mg/dL (ref 8–23)
CHLORIDE: 110 mmol/L (ref 98–111)
CO2: 24 mmol/L (ref 22–32)
Calcium: 9 mg/dL (ref 8.9–10.3)
Creatinine, Ser: 0.93 mg/dL (ref 0.44–1.00)
GFR, EST NON AFRICAN AMERICAN: 56 mL/min — AB (ref >60)
Glucose, Bld: 169 mg/dL — ABNORMAL HIGH (ref 70–99)
POTASSIUM: 3.6 mmol/L (ref 3.5–5.1)
SODIUM: 142 mmol/L (ref 135–145)

## 2018-08-16 LAB — CBC
HEMATOCRIT: 32.9 % — AB (ref 36.0–46.0)
Hemoglobin: 10.7 g/dL — ABNORMAL LOW (ref 12.0–15.0)
MCH: 29.2 pg (ref 26.0–34.0)
MCHC: 32.5 g/dL (ref 30.0–36.0)
MCV: 89.9 fL (ref 78.0–100.0)
Platelets: 139 10*3/uL — ABNORMAL LOW (ref 150–400)
RBC: 3.66 MIL/uL — AB (ref 3.87–5.11)
RDW: 12.9 % (ref 11.5–15.5)
WBC: 4.7 10*3/uL (ref 4.0–10.5)

## 2018-08-16 LAB — TROPONIN I: Troponin I: <0.03 ng/mL (ref <0.03)

## 2018-08-16 LAB — I-STAT TROPONIN, ED: Troponin i, poc: 0.01 ng/mL (ref 0.00–0.08)

## 2018-08-16 LAB — BRAIN NATRIURETIC PEPTIDE: B NATRIURETIC PEPTIDE 5: 308.6 pg/mL — AB (ref 0.0–100.0)

## 2018-08-16 LAB — GLUCOSE, CAPILLARY: GLUCOSE-CAPILLARY: 96 mg/dL (ref 70–99)

## 2018-08-16 LAB — TSH: TSH: 0.012 u[IU]/mL — AB (ref 0.350–4.500)

## 2018-08-16 LAB — T4, FREE: Free T4: 1.67 ng/dL (ref 0.82–1.77)

## 2018-08-16 MED ORDER — ONDANSETRON HCL 4 MG/2ML IJ SOLN: 4.0000 mg | Freq: Four times a day (QID) | INTRAMUSCULAR | Status: DC | PRN

## 2018-08-16 MED ORDER — METFORMIN HCL 500 MG PO TABS
500.0000 mg | ORAL_TABLET | Freq: Every day | ORAL | Status: DC
  Administered 2018-08-16 – 2018-08-17 (×2): 500 mg via ORAL
  Filled 2018-08-16 (×2): qty 1

## 2018-08-16 MED ORDER — PANTOPRAZOLE SODIUM 40 MG PO TBEC
40.0000 mg | DELAYED_RELEASE_TABLET | Freq: Every day | ORAL | Status: DC
  Administered 2018-08-17 – 2018-08-18 (×2): 40 mg via ORAL
  Filled 2018-08-16 (×2): qty 1

## 2018-08-16 MED ORDER — METOPROLOL TARTRATE 5 MG/5ML IV SOLN
5.0000 mg | Freq: Once | INTRAVENOUS | Status: AC
  Administered 2018-08-16: 5 mg via INTRAVENOUS
  Filled 2018-08-16: qty 5

## 2018-08-16 MED ORDER — FENTANYL CITRATE (PF) 100 MCG/2ML IJ SOLN
50.0000 ug | INTRAMUSCULAR | Status: DC | PRN
  Administered 2018-08-17: 50 ug via INTRAVENOUS
  Filled 2018-08-16 (×2): qty 2

## 2018-08-16 MED ORDER — LORATADINE 10 MG PO TABS
10.0000 mg | ORAL_TABLET | Freq: Every day | ORAL | Status: DC
  Administered 2018-08-17 – 2018-08-18 (×2): 10 mg via ORAL
  Filled 2018-08-16 (×2): qty 1

## 2018-08-16 MED ORDER — LEVOTHYROXINE SODIUM 150 MCG PO TABS
150.0000 ug | ORAL_TABLET | Freq: Every day | ORAL | Status: DC
  Administered 2018-08-17 (×2): 150 ug via ORAL
  Filled 2018-08-16: qty 1
  Filled 2018-08-16 (×2): qty 2

## 2018-08-16 MED ORDER — MECLIZINE HCL 25 MG PO TABS: 25.0000 mg | ORAL_TABLET | Freq: Four times a day (QID) | ORAL | Status: DC | PRN

## 2018-08-16 MED ORDER — GLIMEPIRIDE 2 MG PO TABS
2.0000 mg | ORAL_TABLET | Freq: Every day | ORAL | Status: DC
  Administered 2018-08-17 – 2018-08-18 (×2): 2 mg via ORAL
  Filled 2018-08-16 (×2): qty 1

## 2018-08-16 MED ORDER — DILTIAZEM HCL ER COATED BEADS 240 MG PO CP24
240.0000 mg | ORAL_CAPSULE | Freq: Every day | ORAL | Status: DC
  Administered 2018-08-17 – 2018-08-18 (×2): 240 mg via ORAL
  Filled 2018-08-16 (×2): qty 1

## 2018-08-16 MED ORDER — METOPROLOL TARTRATE 50 MG PO TABS
50.0000 mg | ORAL_TABLET | Freq: Two times a day (BID) | ORAL | Status: DC
Start: 2018-08-16 — End: 2018-08-17
  Administered 2018-08-16 – 2018-08-17 (×2): 50 mg via ORAL
  Filled 2018-08-16 (×2): qty 1

## 2018-08-16 MED ORDER — MUPIROCIN 2 % EX OINT: 1.0000 "application " | TOPICAL_OINTMENT | Freq: Every day | CUTANEOUS | Status: DC | PRN

## 2018-08-16 MED ORDER — RIVAROXABAN 15 MG PO TABS
15.0000 mg | ORAL_TABLET | Freq: Every day | ORAL | Status: DC
  Administered 2018-08-16 – 2018-08-17 (×2): 15 mg via ORAL
  Filled 2018-08-16 (×2): qty 1

## 2018-08-16 NOTE — ED Notes (Signed)
Pt oob to [facility] for 300 cc urine

## 2018-08-16 NOTE — ED Provider Notes (Signed)
[facility] EMERGENCY DEPARTMENT Provider Note   CSN: [id number] Arrival date & time: 08/16/18  1303     History   Chief Complaint Chief Complaint  Patient presents with  . Chest Pain    HPI [name] is a 84 y.o. female.  Patient is an 84 year old female with a history of diverticulitis, coronary artery disease, diabetes, chronic atrial fibrillation on Xarelto, stroke who is presenting today with chest pain from her doctor's office.  Patient states symptoms started on Saturday.  She noticed that she would have episodes of chest discomfort which she described as a sharp heavy feeling in the left side of her chest.  These episodes usually would occur with exertion.  She states the pain would come on and she become short of breath and hot.  Usually she would have to sit down and rest and pain would resolve within about 20 minutes.  She had several episodes on Saturday several on Sunday but has had multiple episodes today.  She did have a appointment with her doctor so decided to go to her doctor's appointment.  When she got there she had an episode coming in the door which then resolved.  She denies any chest pain at this time but states that she does not recall the last time she had symptoms like this.  She does see Dr. Einar Gip with cardiology.  She has been taking all of her medications as prescribed.  She states she does have changes in her medication but nothing in the last month that she can remember.  She had noticed some intermittent swelling of her legs but none today.  She denies any abdominal pain, nausea or vomiting.  No fever.  She has a chronic cough but it is unchanged from baseline.  Patient did have a catheterization in 2017 and at that time was found to have diffuse nonobstructive disease.  40 to 45% in the LAD specifically.  Patient had an echo last year that showed an EF of 60 to 65%.  The history is provided by the patient.    Past Medical History:    Diagnosis Date  . Acute diverticulitis 06/2017  . Anemia 06/2017  . ARF (acute renal failure) ([place]) 06/2017  . CAD (coronary artery disease) 03/13/2012  . Diabetes mellitus   . Dysrhythmia    ATRIAL FIB  . Fall 07/07/2017  . HTN (hypertension) 03/13/2012  . Hypothyroid 03/13/2012  . Lung cancer ([place])   . Shortness of breath   . Stroke ([place])   . Syncope and collapse 04/02/2016  . Type II or unspecified type diabetes mellitus without mention of complication, not stated as uncontrolled 03/13/2012    Patient Active Problem List   Diagnosis Date Noted  . Chest pain 07/08/2017  . ARF (acute renal failure) ([place]) 07/08/2017  . Acute diverticulitis 07/08/2017  . Head injury   . Right hip pain 01/03/2017  . Muscle strain 01/03/2017  . Atrial fibrillation with rapid ventricular response ([place]) 12/12/2016  . Atrial fibrillation with RVR ([place])   . Diabetes mellitus with complication ([place])   . Faintness   . Thrombocytopenia ([place]) 04/07/2016  . Anemia 04/07/2016  . SOB (shortness of breath) on exertion   . Syncope and collapse   . Syncope 04/02/2016  . Physical deconditioning 04/20/2015  . Dyspnea 04/17/2015  . SOB (shortness of breath) 04/17/2015  . Acute renal failure superimposed on stage 3 chronic kidney disease ([place]) 04/17/2015  . Hypokalemia 04/17/2015  . Atrial fibrillation (  HCC) CHA2DS2-VASc Score 7 03/13/2012  . HTN (hypertension) 03/13/2012  . Hypothyroid 03/13/2012  . DM (diabetes mellitus), type 2, uncontrolled, with renal complications ([place]) 16/38/4665  . CAD (coronary artery disease) 03/13/2012  . Carcinoid tumor of lung   . Stroke [facility])     Past Surgical History:  Procedure Laterality Date  . ABDOMINAL HYSTERECTOMY    . BREAST EXCISIONAL BIOPSY Left   . BREAST SURGERY     benign tumors removed in 1980  . CARDIAC CATHETERIZATION    . CARDIOVERSION  04/20/2012   Procedure: CARDIOVERSION;  Surgeon: Laverda Page, MD;  Location: [place];  Service: Cardiovascular;   Laterality: N/A;  . CARDIOVERSION N/A 05/10/2013   Procedure: CARDIOVERSION;  Surgeon: Laverda Page, MD;  Location: [place];  Service: Cardiovascular;  Laterality: N/A;  . CHOLECYSTECTOMY    . HEMORRHOID SURGERY    . jaw replacement    . LEFT HEART CATHETERIZATION WITH CORONARY ANGIOGRAM N/A 04/20/2015   Procedure: LEFT HEART CATHETERIZATION WITH CORONARY ANGIOGRAM;  Surgeon: Adrian Prows, MD;  Location: [facility] C[facility] LAB;  Service: Cardiovascular;  Laterality: N/A;  . tumors     back of head     OB History   None      Home Medications    Prior to Admission medications   Medication Sig Start Date End Date Taking? Authorizing Provider  acetaminophen (TYLENOL) 500 MG tablet Take 500 mg by mouth every 6 (six) hours as needed for moderate pain or headache.     [provider]  ALPRAZolam Duanne Moron) 0.25 MG tablet Take 1 tablet (0.25 mg total) by mouth at bedtime. 12/17/16   Dixie Dials, MD  amiodarone (PACERONE) 200 MG tablet Take 1 tablet (200 mg total) by mouth daily. 12/18/16   Dixie Dials, MD  Calcium Carb-Cholecalciferol (CALCIUM 600-D PO) Take 600 mg by mouth daily.    [provider]  clobetasol cream (TEMOVATE) 9.93 % Apply 1 application topically 2 (two) times daily as needed (itching/ do not use on face).    [provider]  colesevelam (WELCHOL) 625 MG tablet Take 312.5-625 mg by mouth daily as needed (diarrhea).    [provider]  diltiazem (CARDIZEM CD) 240 MG 24 hr capsule Take 1 capsule (240 mg total) by mouth daily. 12/18/16   Dixie Dials, MD  Insulin Detemir (LEVEMIR) 100 UNIT/ML Pen Inject 20 Units into the skin daily at 10 pm. Patient taking differently: Inject 20 Units into the skin every morning.  12/17/16   Dixie Dials, MD  levothyroxine (SYNTHROID, LEVOTHROID) 100 MCG tablet Take 1 tablet (100 mcg total) by mouth daily before breakfast. Patient taking differently: Take 150 mcg by mouth daily before breakfast.  12/17/16    Dixie Dials, MD  meclizine (ANTIVERT) 25 MG tablet Take 25 mg by mouth 4 (four) times daily as needed for dizziness.    [provider]  metoprolol (LOPRESSOR) 50 MG tablet Take 50 mg by mouth 2 (two) times daily.    [provider]  mupirocin ointment (BACTROBAN) 2 % Apply 1 application topically daily as needed (itching).     [provider]  pantoprazole (PROTONIX) 40 MG tablet Take 1 tablet (40 mg total) by mouth daily. 12/18/16   Dixie Dials, MD  pramoxine (SARNA SENSITIVE) 1 % LOTN Apply 1 application topically 3 (three) times daily as needed (itching).    [provider]  Rivaroxaban (XARELTO) 15 MG TABS tablet Take 15 mg by mouth daily with supper. 03/16/12  Adrian Prows, MD    Family History Family History  Problem Relation Age of Onset  . Breast cancer Mother   . Aneurysm Father        Likely thoracic aneurysm per patient  . Melanoma Brother   . Heart disease Sister   . Diabetes type II Sister     Social History Social History   Tobacco Use  . Smoking status: Never Smoker  . Smokeless tobacco: Never Used  Substance Use Topics  . Alcohol use: No  . Drug use: No     Allergies   Codeine and Hydrocodone-acetaminophen   Review of Systems Review of Systems  All other systems reviewed and are negative.    Physical Exam Updated Vital Signs BP (!) 132/94 (BP Location: Right Arm)   Pulse (!) 122   Temp 98 F (36.7 C) (Oral)   Resp 18   Ht 5\' 3"  (1.6 m)   Wt 61.2 kg   SpO2 100%   BMI 23.91 kg/m   Physical Exam  Constitutional: She is oriented to person, place, and time. She appears well-developed and well-nourished. No distress.  HENT:  Head: Normocephalic and atraumatic.  Mouth/Throat: Oropharynx is clear and moist.  Eyes: Pupils are equal, round, and reactive to light. Conjunctivae and EOM are normal.  Neck: Normal range of motion. Neck supple.  Cardiovascular: Intact distal pulses. An irregularly irregular rhythm  present. Tachycardia present.  No murmur heard.  No systolic murmur is present. Pulmonary/Chest: Effort normal and breath sounds normal. No respiratory distress. She has no wheezes. She has no rales.  Abdominal: Soft. She exhibits no distension. There is no tenderness. There is no rebound and no guarding.  Musculoskeletal: Normal range of motion. She exhibits no edema or tenderness.  Neurological: She is alert and oriented to person, place, and time.  Skin: Skin is warm and dry. No rash noted. No erythema.  Psychiatric: She has a normal mood and affect. Her behavior is normal.  Nursing note and vitals reviewed.    ED Treatments / Results  Labs (all labs ordered are listed, but only abnormal results are displayed) Labs Reviewed  BASIC METABOLIC PANEL - Abnormal; Notable for the following components:      Result Value   Glucose, Bld 169 (*)    GFR calc non Af Amer 56 (*)    All other components within normal limits  CBC - Abnormal; Notable for the following components:   RBC 3.66 (*)    Hemoglobin 10.7 (*)    HCT 32.9 (*)    Platelets 139 (*)    All other components within normal limits  BRAIN NATRIURETIC PEPTIDE  TROPONIN I  TROPONIN I  TROPONIN I  TSH  I-STAT TROPONIN, ED    EKG EKG Interpretation  Date/Time:  Monday August 16 2018 13:13:18 EDT Ventricular Rate:  109 PR Interval:    QRS Duration: 80 QT Interval:  331 QTC Calculation: 446 R Axis:   99 Text Interpretation:  Atrial fibrillation Ventricular premature complex Anterior infarct, old No significant change since last tracing Confirmed by Blanchie Dessert [phone]) on 08/16/2018 1:16:42 PM   Radiology Dg Chest 2 View  Result Date: 08/16/2018 CLINICAL DATA:  Chest pain, atrial fibrillation, shortness of breath. EXAM: CHEST - 2 VIEW COMPARISON:  Chest x-ray dated 02/03/2017. FINDINGS: Heart size and mediastinal contours are not significantly changed. Atherosclerotic changes noted at the aortic arch. No new  confluent opacity to suggest a developing pneumonia. No evidence of active CHF or pulmonary  edema. Blunting at the LEFT costophrenic angle, consistent with atelectasis and/or small pleural effusion, new compared to the previous chest x-ray. Suspect small RIGHT pleural effusion as well. Nodular density is again seen overlying the lower margin of the heart shadow on the lateral view, corresponding to 1 of the pulmonary nodules demonstrated on interval chest CT of 07/08/2017, described as not significantly changed on chest CT report dating back to 2004 indicating benignity. No new nodule seen. Mild degenerative change throughout the kyphotic thoracic spine. No acute appearing osseous abnormality. IMPRESSION: 1. Small LEFT pleural effusion and/or atelectasis. Probable small RIGHT pleural effusion. Lungs otherwise clear with no evidence of pneumonia or pulmonary edema. 2. Aortic atherosclerosis. 3. Multiple pulmonary nodules nodules described on earlier chest CT report, better seen on earlier chest CTs. Electronically Signed   By: Franki Cabot M.D.   On: 08/16/2018 13:53    Procedures Procedures (including critical care time)  Medications Ordered in ED Medications  metoprolol tartrate (LOPRESSOR) injection 5 mg (has no administration in time range)     Initial Impression / Assessment and Plan / ED Course  I have reviewed the triage vital signs and the nursing notes.  Pertinent labs & imaging results that were available during my care of the patient were reviewed by me and considered in my medical decision making (see chart for details).     Elderly female with a history of chronic atrial fibrillation and coronary artery disease presenting today with symptoms concerning for worsening stable angina.  She has been having intermittent chest pain since Saturday usually the only presents with exertion.  Patient is found to be in atrial fibrillation today with RVR.  Rate is between 110-120.  She is  complaining of distinct episodes of pain that typically lasts 20 minutes because of shortness of breath and for her to feel hot.  They are usually related to exertion and resolve when she rests.  Patient denies any change in her medication over the last few weeks.  She has noted intermittent swelling in her legs but none today.  She has no unilateral calf pain.  Shortness of breath is only when she is having pain but not at baseline.  She denies any shortness of breath or chest pain currently.  Patient's EKG shows atrial fibrillation with RVR with nonspecific T wave changes.  Patient's BMP without acute findings, CBC with no evidence of new anemia, initial troponin within normal limits.  Will speak with Dr. Einar Gip the patient's cardiologist for further care.  Patient does take Xarelto.  Patient is tachycardic here and was given 5 mg of metoprolol.  She denies any infectious symptoms, chest x-ray does show some fluid overload.  BNP pending.  Final Clinical Impressions(s) / ED Diagnoses   Final diagnoses:  Unstable angina [facility])  Atrial fibrillation with RVR [facility])    ED Discharge Orders    None       Blanchie Dessert, MD 08/16/18 1458

## 2018-08-16 NOTE — ED Notes (Signed)
Patient transported to X-ray 

## 2018-08-16 NOTE — ED Notes (Signed)
Paged Einar Gip to [place], RN

## 2018-08-16 NOTE — ED Notes (Signed)
Margot of flow manager states pt is going to [facility]

## 2018-08-16 NOTE — ED Notes (Signed)
Pt oob to bathroom via wc.

## 2018-08-16 NOTE — H&P (Signed)
[name] is an 84 y.o. female.   Chief Complaint: Chest pain HPI: [name]  is a 84 y.o. female  With chronic atrial fibrillation, low-grade lung carcinoid tumor, moderate coronary artery disease by coronary angiography in 2017 and mild to moderate pulmonary hypertension, diabetes mellitus type 2 controlled, mild hyperlipidemia who is being evaluated in the emergency room when she went to see her PCP and complained of severe left-sided chest pain.  Chest pain started on Friday, has been constant with waxing and waning nature over the past 3 days.  She did not take any sublingual nitroglycerin.  Chest pain is described as sharp pain to heaviness in the left upper side of the chest, radiating to the back and also to her jaw and is worse when she takes a deep breath and also when she moves her left arm.  She has been on anticoagulation with Xarelto and has been compliant.  No recent weight changes, denies palpitations.  She has noticed slight worsening chronic dyspnea.  No PND or orthopnea.  Past Medical History:  Diagnosis Date  . Acute diverticulitis 06/2017  . Anemia 06/2017  . ARF (acute renal failure) ([place]) 06/2017  . CAD (coronary artery disease) 03/13/2012  . Diabetes mellitus   . Dysrhythmia    ATRIAL FIB  . Fall 07/07/2017  . HTN (hypertension) 03/13/2012  . Hypothyroid 03/13/2012  . Lung cancer ([place])   . Shortness of breath   . Stroke ([place])   . Syncope and collapse 04/02/2016  . Type II or unspecified type diabetes mellitus without mention of complication, not stated as uncontrolled 03/13/2012    Past Surgical History:  Procedure Laterality Date  . ABDOMINAL HYSTERECTOMY    . BREAST EXCISIONAL BIOPSY Left   . BREAST SURGERY     benign tumors removed in 1980  . CARDIAC CATHETERIZATION    . CARDIOVERSION  04/20/2012   Procedure: CARDIOVERSION;  Surgeon: Laverda Page, MD;  Location: [place];  Service: Cardiovascular;  Laterality: N/A;  . CARDIOVERSION N/A 05/10/2013    Procedure: CARDIOVERSION;  Surgeon: Laverda Page, MD;  Location: [place];  Service: Cardiovascular;  Laterality: N/A;  . CHOLECYSTECTOMY    . HEMORRHOID SURGERY    . jaw replacement    . LEFT HEART CATHETERIZATION WITH CORONARY ANGIOGRAM N/A 04/20/2015   Procedure: LEFT HEART CATHETERIZATION WITH CORONARY ANGIOGRAM;  Surgeon: Adrian Prows, MD;  Location: [facility] CATH LAB;  Service: Cardiovascular;  Laterality: N/A;  . tumors     back of head    Family History  Problem Relation Age of Onset  . Breast cancer Mother   . Aneurysm Father        Likely thoracic aneurysm per patient  . Melanoma Brother   . Heart disease Sister   . Diabetes type II Sister    Social History:  reports that she has never smoked. She has never used smokeless tobacco. She reports that she does not drink alcohol or use drugs.  Allergies:  Allergies  Allergen Reactions  . Codeine Nausea And Vomiting  . Hydrocodone-Acetaminophen Nausea And Vomiting   Review of Systems  Constitutional: Negative for chills, fever and weight loss.  HENT: Negative.   Eyes: Negative.   Respiratory: Positive for shortness of breath. Negative for hemoptysis and sputum production.   Cardiovascular: Positive for chest pain. Negative for palpitations, claudication, leg swelling and PND.  Gastrointestinal: Negative.   Genitourinary: Negative.   Musculoskeletal: Negative.   Skin: Negative.   Neurological: Negative.  Endo/Heme/Allergies: Negative.   Psychiatric/Behavioral: Negative.   All other systems reviewed and are negative.   Blood pressure 125/79, pulse (!) 109, temperature 98 F (36.7 C), temperature source Oral, resp. rate 16, height 5\' 3"  (1.6 m), weight 61.2 kg, SpO2 (!) 89 %. Body mass index is 23.91 kg/m. Body mass index is 23.91 kg/m. Physical Exam  Constitutional: She is oriented to person, place, and time.  She is moderately built and well-nourished in no acute distress, petite.  HENT:  Head: Atraumatic.   Eyes: Conjunctivae are normal.  Neck: Neck supple. No JVD present. No tracheal deviation present. No thyromegaly present.  Cardiovascular:  S1 is variable, S2 is normal.  Distant heart sounds.  Midsystolic murmur 2/6 in intensity.  No gallop appreciated.  Pulmonary/Chest: Effort normal and breath sounds normal.  Abdominal: Soft. Bowel sounds are normal.  Musculoskeletal: Normal range of motion. She exhibits no edema or deformity.  Neurological: She is alert and oriented to person, place, and time.  Skin: Skin is warm and dry.  Psychiatric: She has a normal mood and affect.   Labs:   Lab Results  Component Value Date   WBC 4.7 08/16/2018   HGB 10.7 (L) 08/16/2018   HCT 32.9 (L) 08/16/2018   MCV 89.9 08/16/2018   PLT 139 (L) 08/16/2018   Recent Labs  Lab 08/16/18 1313  NA 142  K 3.6  CL 110  CO2 24  BUN 9  CREATININE 0.93  CALCIUM 9.0  GLUCOSE 169*    Lipid Panel     Component Value Date/Time   CHOL 120 12/16/2016 0312   TRIG 130 12/16/2016 0312   HDL 32 (L) 12/16/2016 0312   CHOLHDL 3.8 12/16/2016 0312   VLDL 26 12/16/2016 0312   LDLCALC 62 12/16/2016 0312    BNP (last 3 results) Recent Labs    08/16/18 1413  BNP 308.6*    HEMOGLOBIN A1C Lab Results  Component Value Date   HGBA1C 8.0 (H) 12/13/2016   MPG 183 12/13/2016    Cardiac Panel (last 3 results) Recent Labs    08/16/18 1456  TROPONINI <0.03    Lab Results  Component Value Date   CKTOTAL 66 03/14/2012   CKMB 2.0 03/14/2012   TROPONINI <0.03 08/16/2018     TSH Recent Labs    08/16/18 1457  TSH 0.012*   Scheduled Meds: . diltiazem  240 mg Oral Daily  . glimepiride  2 mg Oral Daily  . [START ON 08/17/2018] levothyroxine  150 mcg Oral QAC breakfast  . loratadine  10 mg Oral Daily  . metFORMIN  500 mg Oral Q supper  . metoprolol tartrate  50 mg Oral BID  . pantoprazole  40 mg Oral Daily  . rivaroxaban  15 mg Oral Q supper   Continuous Infusions: PRN Meds:.fentaNYL (SUBLIMAZE)  injection, meclizine, mupirocin ointment, ondansetron (ZOFRAN) IV  CARDIAC STUDIES:  EKG 08/16/2018: Atrial fibrillation with RVR, nonspecific T abnormality.  No evidence of ischemia.  Echocardiogram 07/09/2017:  Normal LV systolic function, EF 60 to 65% with grade 1 diastolic dysfunction.  Moderate aortic stenosis.  Mild aortic regurgitation.  Peak gradient 31, mean gradient 19 mmHg, valve area 0.84 cm.  Moderate MR, mild MS with mitral valve peak gradient of 10 and mean gradient of 4 mmHg.  Calcified mitral annulus.  Moderate left atrial enlargement and mild right atrial enlargement, moderate pulmonary hypertension, PA pressure 46 mmHg.  Coronary angiogram 06/24/2016: Mild to moderate diffuse coronary artery disease without high-grade stenosis. Proximal LAD  with a 40-50% stenosis, in some views at most 50-60% stenosed and calcified. Small circumflex, mildly diseased large right coronary artery.  Aortic valve not crossed. Difficulty in crossing. Patient has history of mild aortic stenosis by echocardiogram April 2017.  Right heart catheterization revealing mild pulmonary hypertension, PA mean pressure of 26 mmHg, wedge mean was 13 mmHg. Cardiac output 3.7, cardiac index 2.3 by Fick. There was no significant shunt, QP: QS 0.79. RA mean 3 mmHg, RA saturation 59%.; RV 34/0, EDP 3 mmHg.; PTA 36/17, mean 26 mmHg. PA saturation 49%.; Pulmonary wedge mean of 13 mmHg.  Assessment/Plan 1.  Chest pain appears to be clearly musculoskeletal and is easily reproduced with cough and also moving her left arm.  Some features of radiation to the left neck may indicate atypical angina, will need to be admitted to rule out ACS/MI. 2.  Atrial fibrillation with rapid ventricular response 3.  Shortness of breath and dyspnea on exertion, suspect probably mild diastolic heart failure and also atrial fibrillation may be contributing.  She has underlying lung disease with chronically scarred lungs and "carcinoid lung  disease". 4.  Chronic atrial fibrillation CHA2DS2-VASCScore: Risk Score  6,  Yearly risk of stroke  9.8, presently on Xarelto 5.  Chronic stage III kidney disease  secondar to hypertension and diabetes mellites 6.  Hypertension 7.  Hyperlipidemia  Recommendation: Patient will be admitted to the hospital for ACS rule out.  Pain control with fentanyl, continue anticoagulation, unless she shows positive troponins or worsening chest pain, or EKG changes, then she will need coronary angiography for reevaluation of moderate CAD in the LAD territory. She does have hyperlipidemia, however stents have been discontinued, reasons not known.  I will follow-up on this.  She has mild hyperlipidemia.  Blood pressure is well controlled.  Addendum: Serum troponins negative, TSH markedly reduced.  We will repeat T3-T4.  Symptoms of A. fib with RVR may be related to iatrogenic hyperthyroidism,  Amiodarone may also be contributing.  Adrian Prows, MD 08/16/2018, 5:17 PM [facility] [place] Pager: [phone] Office: [phone] If no answer: Cell:  [phone]

## 2018-08-16 NOTE — ED Notes (Signed)
Attempted to call report

## 2018-08-16 NOTE — ED Notes (Addendum)
Dr. Einar Gip contacted and informed pt c/o pain at left chest with movement of head and left shoulder. States he is treating musculoskelatal pain. New orders resulting

## 2018-08-16 NOTE — ED Triage Notes (Signed)
Pt arrives EMS from MD office with c/o chest pain Given Aspirin 324 mg and nitro x 1 PTA now pain free. Pt has hx of Afib and currently same.

## 2018-08-16 NOTE — ED Notes (Signed)
Pt denies chest pain or shortness of breath. Returns to stretcher with minimal assist.

## 2018-08-16 NOTE — ED Notes (Signed)
ED Provider at bedside. 

## 2018-08-16 NOTE — ED Notes (Signed)
Pt now denies chest pain.

## 2018-08-17 ENCOUNTER — Other Ambulatory Visit: Payer: Self-pay

## 2018-08-17 DIAGNOSIS — I5032 Chronic diastolic (congestive) heart failure: Secondary | ICD-10-CM | POA: Diagnosis not present

## 2018-08-17 DIAGNOSIS — R0789 Other chest pain: Secondary | ICD-10-CM | POA: Diagnosis not present

## 2018-08-17 DIAGNOSIS — I130 Hypertensive heart and chronic kidney disease with heart failure and stage 1 through stage 4 chronic kidney disease, or unspecified chronic kidney disease: Secondary | ICD-10-CM | POA: Diagnosis not present

## 2018-08-17 DIAGNOSIS — E1122 Type 2 diabetes mellitus with diabetic chronic kidney disease: Secondary | ICD-10-CM | POA: Diagnosis not present

## 2018-08-17 LAB — GLUCOSE, CAPILLARY
GLUCOSE-CAPILLARY: 127 mg/dL — AB (ref 70–99)
GLUCOSE-CAPILLARY: 107 mg/dL — AB (ref 70–99)
GLUCOSE-CAPILLARY: 135 mg/dL — AB (ref 70–99)
GLUCOSE-CAPILLARY: 135 mg/dL — AB (ref 70–99)

## 2018-08-17 LAB — TROPONIN I: Troponin I: <0.03 ng/mL (ref <0.03)

## 2018-08-17 MED ORDER — PROPRANOLOL HCL ER 120 MG PO CP24
120.0000 mg | ORAL_CAPSULE | Freq: Every day | ORAL | Status: DC
  Administered 2018-08-17 – 2018-08-18 (×2): 120 mg via ORAL
  Filled 2018-08-17 (×2): qty 1

## 2018-08-17 MED ORDER — LEVOTHYROXINE SODIUM 100 MCG PO TABS: 100.0000 ug | ORAL_TABLET | Freq: Every day | ORAL | Status: DC

## 2018-08-17 NOTE — Progress Notes (Signed)
Subjective:  Doing well. Still has chest pian if she moves her arm, no dyspnea, PND or orthopnea.   Objective:  Vital Signs in the last 24 hours: Temp:  [97.7 F (36.5 C)-98.9 F (37.2 C)] 98.9 F (37.2 C) (08/20 1233) Pulse Rate:  [70-155] 94 (08/20 1233) Resp:  [16-27] 18 (08/20 1233) BP: (100-170)/(56-121) 100/60 (08/20 1233) SpO2:  [87 %-99 %] 95 % (08/20 1233) Weight:  [60.6 kg-60.9 kg] 60.6 kg (08/20 0310)  Intake/Output from previous day: 08/19 0701 - 08/20 0700 In: 240 [P.O.:240] Out: 750 [Urine:750]  Physical Exam: Blood pressure 100/60, pulse 94, temperature 98.9 F (37.2 C), temperature source Oral, resp. rate 18, height 5\' 3"  (1.6 m), weight 60.6 kg, SpO2 95 %.  Physical Exam  Constitutional: Vital signs are normal. She appears well-nourished.  Moderately built  Eyes: Conjunctivae are normal.  Neck: Neck supple. No JVD present. No thyromegaly present.  Cardiovascular: S2 normal and intact distal pulses. An irregularly irregular rhythm present. Tachycardia present. Exam reveals no S3.  No murmur heard. Pulmonary/Chest: Effort normal and breath sounds normal.  Abdominal: Soft. Bowel sounds are normal.  Lymphadenopathy:    She has no cervical adenopathy.  Neurological: She is alert.  Skin: Skin is warm and dry.  Psychiatric: She has a normal mood and affect.    Lab Results: BMP Recent Labs    08/16/18 1313  NA 142  K 3.6  CL 110  CO2 24  GLUCOSE 169*  BUN 9  CREATININE 0.93  CALCIUM 9.0  GFRNONAA 56*  GFRAA >60    CBC Recent Labs  Lab 08/16/18 1313  WBC 4.7  RBC 3.66*  HGB 10.7*  HCT 32.9*  PLT 139*  MCV 89.9  MCH 29.2  MCHC 32.5  RDW 12.9   Cardiac Panel (last 3 results) Recent Labs    08/16/18 1456 08/16/18 2134 08/17/18 0427  TROPONINI <0.03 <0.03 <0.03   TSH Recent Labs    08/16/18 1457  TSH 0.012*    Lipid Panel     Component Value Date/Time   CHOL 120 12/16/2016 0312   TRIG 130 12/16/2016 0312   HDL 32 (L)  12/16/2016 0312   CHOLHDL 3.8 12/16/2016 0312   VLDL 26 12/16/2016 0312   LDLCALC 62 12/16/2016 0312   Imaging: Dg Chest 2 View  Result Date: 08/16/2018 CLINICAL DATA:  Chest pain, atrial fibrillation, shortness of breath. EXAM: CHEST - 2 VIEW COMPARISON:  Chest x-ray dated 02/03/2017. FINDINGS: Heart size and mediastinal contours are not significantly changed. Atherosclerotic changes noted at the aortic arch. No new confluent opacity to suggest a developing pneumonia. No evidence of active CHF or pulmonary edema. Blunting at the LEFT costophrenic angle, consistent with atelectasis and/or small pleural effusion, new compared to the previous chest x-ray. Suspect small RIGHT pleural effusion as well. Nodular density is again seen overlying the lower margin of the heart shadow on the lateral view, corresponding to 1 of the pulmonary nodules demonstrated on interval chest CT of 07/08/2017, described as not significantly changed on chest CT report dating back to 2004 indicating benignity. No new nodule seen. Mild degenerative change throughout the kyphotic thoracic spine. No acute appearing osseous abnormality. IMPRESSION: 1. Small LEFT pleural effusion and/or atelectasis. Probable small RIGHT pleural effusion. Lungs otherwise clear with no evidence of pneumonia or pulmonary edema. 2. Aortic atherosclerosis. 3. Multiple pulmonary nodules nodules described on earlier chest CT report, better seen on earlier chest CTs. Electronically Signed   By: Roxy Horseman.D.  On: 08/16/2018 13:53    Cardiac Studies:  EKG 08/17/2018: A. Fibrillation with controlled V response. No ST T changes of ischemia. Normal QT interval.  .  Echocardiogram 07/09/2017:  Normal LV systolic function, EF 60 to 65% with grade 1 diastolic dysfunction.  Moderate aortic stenosis.  Mild aortic regurgitation.  Peak gradient 31, mean gradient 19 mmHg, valve area 0.84 cm.  Moderate MR, mild MS with mitral valve peak gradient of 10 and mean  gradient of 4 mmHg.  Calcified mitral annulus.  Moderate left atrial enlargement and mild right atrial enlargement, moderate pulmonary hypertension, PA pressure 46 mmHg.  Coronary angiogram 06/24/2016: Mild to moderate diffuse coronary artery disease without high-grade stenosis. Proximal LAD with a 40-50% stenosis, in some views at most 50-60% stenosed and calcified. Small circumflex, mildly diseased large right coronary artery.  Aortic valve not crossed. Difficulty in crossing. Patient has history of mild aortic stenosis by echocardiogram April 2017.  Right heart catheterizationrevealing mild pulmonary hypertension, PA mean pressure of 26 mmHg, wedge mean was 13 mmHg. Cardiac output 3.7, cardiac index 2.3 by Fick. There was no significant shunt, QP: QS 0.79. RA mean 3 mmHg, RA saturation 59%.; RV 34/0, EDP 3 mmHg.; PTA 36/17, mean 26 mmHg. PA saturation 49%.; Pulmonary wedge mean of 13 mmHg.  Assessment/Plan:  1. Musculoskeletal Chest Pain 2. Stage  3 CKD due to DM, controlled 3. A. Fib with RVR on telemetry 4. Iatrogenic hyperthyroidism. Patient received 300 mcg Synthyroid today. 5. Chronic dyspnea stable.  Chronic diastolic heart failure.  Rec: Doing well from cardiac stand point. On tele, A. Fib remains uncontrolled rate and she received 300 mcg of synthyroid today. Keep in house to observe for HR variability and d/c tomorrow. Amiodarone may be playing a role for hyperthyroidism as well. Needs close monitoring. Renal function is stable. Continue Xarelto.  Adrian Prows, M.D. 08/17/2018, 1:37 PM [facility], [place] Pager: [phone] Office: [phone] If no answer: [phone]

## 2018-08-17 NOTE — Plan of Care (Signed)
  Problem: Education: Goal: Knowledge of General Education information will improve Description Including pain rating scale, medication(s)/side effects and non-pharmacologic comfort measures Outcome: Progressing   Problem: Activity: Goal: Risk for activity intolerance will decrease Outcome: Progressing Note:  Patient Right knee gives out and has expressed concerns for safety at home with husband that is forgetful and unable to help with house keeping.    Problem: Coping: Goal: Level of anxiety will decrease Outcome: Not Progressing Note:  Patient needs help at home. Stated that she did not want to tell her sons that her and dad are sick

## 2018-08-17 NOTE — Progress Notes (Signed)
Patient had a urgent bowel incontinence episode with, after bending to clean was experiencing chest pain at 8. No radiating gave PRN Obtained EKG. Chest pain subsided quickly. Patient became nausea and hot, gave a cool drink and patient is comfortable in bed with no distress. Cardiac labs drawn prior to [facility].

## 2018-08-17 NOTE — Plan of Care (Signed)
  Problem: Education: Goal: Knowledge of General Education information will improve Description Including pain rating scale, medication(s)/side effects and non-pharmacologic comfort measures Outcome: Adequate for Discharge   Problem: Health Behavior/Discharge Planning: Goal: Ability to manage health-related needs will improve Outcome: Adequate for Discharge   

## 2018-08-17 NOTE — Progress Notes (Signed)
Paged dr. On call to make aware of patient experience.

## 2018-08-17 NOTE — Evaluation (Signed)
Physical Therapy Evaluation Patient Details Name: [name] MRN: [id number] DOB: 09/24/1934 Today's Date: 08/17/2018   History of Present Illness  [name]  is a 84 y.o. female  With chronic atrial fibrillation, low-grade lung carcinoid tumor, moderate coronary artery disease by coronary angiography in 2017 and mild to moderate pulmonary hypertension, DM2 controlled, mild hyperlipidemia who came to ED from PCP due to c/o of severe left-sided chest pain.  Clinical Impression  Pt admitted with above. Noted MD feel that chest pain is musculoskeletal related however when pt amb pt with SOB and HR into 130s bpm, s/p 50' of amb. Pt recovers within a minute however becomes very SOB, increased HR and increased chest tightness with all mobility. Acute PT to cont to follow.    Follow Up Recommendations Supervision/Assistance - 24 hour;Home health PT    Equipment Recommendations  None recommended by PT    Recommendations for Other Services       Precautions / Restrictions Precautions Precautions: Fall Precaution Comments: watch HR and O2 during activity Restrictions Weight Bearing Restrictions: No      Mobility  Bed Mobility Overal bed mobility: Needs Assistance Bed Mobility: Rolling;Sidelying to Sit Rolling: Min guard Sidelying to sit: Min guard       General bed mobility comments: max directional verbal cues, had pt hold blanket over chest however didn't improve pain, used R arm to pull up on bed rail  Transfers Overall transfer level: Needs assistance Equipment used: None Transfers: Sit to/from Stand Sit to Stand: Min guard         General transfer comment: pt pushed up from bed and used rail in bathroom, slow and guided with wide base of support  Ambulation/Gait Ambulation/Gait assistance: Min assist Gait Distance (Feet): 50 Feet(x2) Assistive device: Rolling walker (2 wheeled);None Gait Pattern/deviations: Step-through pattern;Decreased stride  length;Narrow base of support Gait velocity: slow Gait velocity interpretation: <1.31 ft/sec, indicative of household ambulator General Gait Details: pt asked to ambulate without RW, pt held onto hallway rail with R UE and PTs  hand with L UE. Pt with noted SOB. SpO2 88% on RA and HR 133 s/p amb of 50'. Pt with instability of R LE as well requiring minA for safe ambulation. Pt given RW to amb back. Pt with improved stability, increased step height and lenght and no R LE instability. Pt cont to have SOB and HR into 130s. Pt recovered to 78bpm within a minute of rest. Pt c/o chest tightness  Stairs            Wheelchair Mobility    Modified Rankin (Stroke Patients Only)       Balance Overall balance assessment: Needs assistance Sitting-balance support: Feet supported;No upper extremity supported Sitting balance-Leahy Scale: Good     Standing balance support: No upper extremity supported;During functional activity Standing balance-Leahy Scale: Poor Standing balance comment: pt stood to pull up underwear with wide base of support, pt washed hands at sink while leaning up against sink                             Pertinent Vitals/Pain Pain Assessment: 0-10 Pain Score: 5  Pain Location: chest pain Pain Descriptors / Indicators: Tightness Pain Intervention(s): Monitored during session    Home Living Family/patient expects to be discharged to:: Private residence Living Arrangements: Spouse/significant other Available Help at Discharge: Family;Available 24 hours/day(unsure of state of spouse) Type of Home: House Home Access: Stairs to  enter Entrance Stairs-Rails: Can reach both Entrance Stairs-Number of Steps: 8 Home Layout: Able to live on main level with bedroom/bathroom;Multi-level Home Equipment: Walker - 2 wheels;Cane - single point;Bedside commode;Shower seat;Grab bars - tub/shower Additional Comments: per RN pt concerned about spouses decline in memory     Prior Function Level of Independence: Independent         Comments: pt reports using cane and walker 3-4x/day. She also states she only leaves the house 1x/month for grocery shopping, more if she had MD appointments. Pt states she does the laundry and makes the meals     Hand Dominance   Dominant Hand: Left    Extremity/Trunk Assessment   Upper Extremity Assessment Upper Extremity Assessment: Generalized weakness    Lower Extremity Assessment Lower Extremity Assessment: Generalized weakness    Cervical / Trunk Assessment Cervical / Trunk Assessment: Kyphotic  Communication   Communication: HOH  Cognition Arousal/Alertness: Awake/alert Behavior During Therapy: WFL for tasks assessed/performed Overall Cognitive Status: No family/caregiver present to determine baseline cognitive functioning                                 General Comments: pt with decreased insight to deficits, tangental speech, decreased safety awareness      General Comments General comments (skin integrity, edema, etc.): pt supervision for hygiene s/p tolieting    Exercises     Assessment/Plan    PT Assessment Patient needs continued PT services  PT Problem List Decreased strength;Decreased range of motion;Decreased activity tolerance;Decreased balance;Decreased mobility;Decreased cognition;Decreased knowledge of use of DME;Decreased safety awareness;Decreased knowledge of precautions;Pain       PT Treatment Interventions DME instruction;Gait training;Stair training;Functional mobility training;Therapeutic activities;Therapeutic exercise;Balance training;Neuromuscular re-education    PT Goals (Current goals can be found in the Care Plan section)  Acute Rehab PT Goals Patient Stated Goal: home and be active PT Goal Formulation: With patient Time For Goal Achievement: 08/31/18 Potential to Achieve Goals: Good    Frequency Min 3X/week   Barriers to discharge Decreased caregiver  support unsure of cognitive status of spouse, per RN spouse gets lost    Co-evaluation               AM-PAC PT "6 Clicks" Daily Activity  Outcome Measure Difficulty turning over in bed (including adjusting bedclothes, sheets and blankets)?: Unable Difficulty moving from lying on back to sitting on the side of the bed? : Unable Difficulty sitting down on and standing up from a chair with arms (e.g., wheelchair, bedside commode, etc,.)?: Unable Help needed moving to and from a bed to chair (including a wheelchair)?: A Little Help needed walking in hospital room?: A Little Help needed climbing 3-5 steps with a railing? : A Little 6 Click Score: 12    End of Session Equipment Utilized During Treatment: Gait belt Activity Tolerance: Patient limited by pain Patient left: in chair;with call bell/phone within reach;with chair alarm set Nurse Communication: Mobility status(increased HR into 130s during amb) PT Visit Diagnosis: Unsteadiness on feet (R26.81);Muscle weakness (generalized) (M62.81)    Time: 8315-1761 PT Time Calculation (min) (ACUTE ONLY): 35 min   Charges:   PT Evaluation $PT Eval Moderate Complexity: 1 Mod PT Treatments $Gait Training: 8-22 mins        Kittie Plater, PT, DPT Pager #: [phone] Office #: [phone]   [place] 08/17/2018, 8:39 AM

## 2018-08-17 NOTE — Discharge Instructions (Signed)

## 2018-08-18 LAB — THYROID PEROXIDASE ANTIBODY: THYROID PEROXIDASE ANTIBODY: 34 [IU]/mL (ref 0–34)

## 2018-08-18 LAB — GLUCOSE, CAPILLARY: GLUCOSE-CAPILLARY: 140 mg/dL — AB (ref 70–99)

## 2018-08-18 LAB — T3, FREE: T3 FREE: 3.6 pg/mL (ref 2.0–4.4)

## 2018-08-18 MED ORDER — LEVOTHYROXINE SODIUM 100 MCG PO TABS: 100.0000 ug | ORAL_TABLET | Freq: Every day | ORAL | 3 refills | Status: DC

## 2018-08-18 MED ORDER — AMIODARONE HCL 200 MG PO TABS: 100.0000 mg | ORAL_TABLET | Freq: Every day | ORAL | 1 refills | Status: DC

## 2018-08-18 MED ORDER — LEVOTHYROXINE SODIUM 100 MCG PO TABS
100.0000 ug | ORAL_TABLET | Freq: Every day | ORAL | Status: DC
  Administered 2018-08-18: 100 ug via ORAL
  Filled 2018-08-18: qty 1

## 2018-08-18 MED ORDER — PROPRANOLOL HCL ER 120 MG PO CP24: 120.0000 mg | ORAL_CAPSULE | Freq: Every day | ORAL | 2 refills | Status: DC

## 2018-08-18 NOTE — Discharge Summary (Signed)
Physician Discharge Summary  Patient ID: [name] MRN: [id number] DOB/AGE: 82-May-1935 82 y.o.  Admit date: 08/16/2018 Discharge date: 08/18/2018  Primary Discharge Diagnosis 1.  Musculoskeletal chest pain 2.  Atrial fibrillation with rapid ventricular response  Secondary Discharge Diagnosis Hypertension Iatrogenic hyperthyroidism, probably due to increased Synthroid dose, cannot exclude amiodarone interaction. Chronic diastolic heart failure CAD, moderate disease by angiography on 06/24/2016. Anemia of chronic disease, chronic thrombocytopenia without bleeding disorder. Diabetes mellitus type 2 controlled without hyperglycemia with stage III chronic kidney disease. Patient not using any insulin:    Hospital Course:  Patient admitted on 08/16/2018 and discharged home on 08/18/2018 with chest pain associated with shortness of breath.  Chest pain was clearly felt to be musculoskeletal, she was ruled out for myocardial infarction.  She was also found to be in atrial fibrillation with rapid ventricular response, hence kept in the hospital until heart rate was controlled.  During hospitalization she was also found to have markedly reduced TSH, her Synthroid dose was reduced from 150 mcg to 100 mcg daily, metoprolol was discontinued and switched to propranolol LA 120 mg daily and amiodarone was reduced from 200 mg to 100 g daily.  She is recommended to follow-up with her PCP with regard to further management of hyperthyroidism appears to be fairly complex.  She will see me back in the office in 2 weeks.  Continue anticoagulation with Xarelto for chronic atrial fibrillation.  Cardiac studies:  EKG 08/17/2018: A. Fibrillation with controlled V response. No ST T changes of ischemia. Normal QT interval.  .  Echocardiogram 07/09/2017:  Normal LV systolic function, EF 60 to 65% with grade 1 diastolic dysfunction. Moderate aortic stenosis. Mild aortic regurgitation. Peak gradient 31, mean  gradient 19 mmHg, valve area 0.84 cm. Moderate MR, mild MS with mitral valve peak gradient of 10 and mean gradient of 4 mmHg. Calcified mitral annulus. Moderate left atrial enlargement and mild right atrial enlargement, moderate pulmonary hypertension, PA pressure 46 mmHg.  Coronary angiogram 06/24/2016: Mild to moderate diffuse coronary artery disease without high-grade stenosis. Proximal LAD with a 40-50% stenosis, in some views at most 50-60% stenosed and calcified. Small circumflex, mildly diseased large right coronary artery.  Aortic valve not crossed. Difficulty in crossing. Patient has history of mild aortic stenosis by echocardiogram April 2017.  Right heart catheterizationrevealing mild pulmonary hypertension, PA mean pressure of 26 mmHg, wedge mean was 13 mmHg. Cardiac output 3.7, cardiac index 2.3 by Fick. There was no significant shunt, QP: QS 0.79. RA mean 3 mmHg, RA saturation 59%.; RV 34/0, EDP 3 mmHg.; PTA 36/17, mean 26 mmHg. PA saturation 49%.; Pulmonary wedge mean of 13 mmHg. Discharge Exam: Blood pressure 101/64, pulse 92, temperature 97.7 F (36.5 C), temperature source Oral, resp. rate 18, height 5\' 3"  (1.6 m), weight 61.6 kg, SpO2 92 %.  Constitutional: Vital signs are normal. She appears well-nourished.  Moderately built  Eyes: Conjunctivae are normal.  Neck: Neck supple. No JVD present. No thyromegaly present.  Cardiovascular: S1 variable, S2 normal and intact distal pulses. An irregularly irregular rhythm present.  Exam reveals no S3. No murmur heard. Pulmonary/Chest: Effort normal and breath sounds normal.  Abdominal: Soft. Bowel sounds are normal.  Lymphadenopathy:    She has no cervical adenopathy.  Neurological: She is alert.  Skin: Skin is warm and dry.  Psychiatric: She has a normal mood and affect.   Labs:   Lab Results  Component Value Date   WBC 4.7 08/16/2018   HGB 10.7 (L)  08/16/2018   HCT 32.9 (L) 08/16/2018   MCV 89.9 08/16/2018   PLT  139 (L) 08/16/2018    Recent Labs  Lab 08/16/18 1313  NA 142  K 3.6  CL 110  CO2 24  BUN 9  CREATININE 0.93  CALCIUM 9.0  GLUCOSE 169*   BNP (last 3 results) Recent Labs    08/16/18 1413  BNP 308.6*   Cardiac Panel (last 3 results) Recent Labs    08/16/18 1456 08/16/18 2134 08/17/18 0427  TROPONINI <0.03 <0.03 <0.03  TSH Recent Labs    08/16/18 1457  TSH 0.012*   Free T4 0.82 - 1.77 ng/dL 1.67    T3, Free 2.0 - 4.4 pg/mL 3.6    Thyroperoxidase Ab SerPl-aCnc 0 - 34 IU/mL 34    Radiology: Dg Chest 2 View  Result Date: 08/16/2018 CLINICAL DATA:  Chest pain, atrial fibrillation, shortness of breath. EXAM: CHEST - 2 VIEW COMPARISON:  Chest x-ray dated 02/03/2017. FINDINGS: Heart size and mediastinal contours are not significantly changed. Atherosclerotic changes noted at the aortic arch. No new confluent opacity to suggest a developing pneumonia. No evidence of active CHF or pulmonary edema. Blunting at the LEFT costophrenic angle, consistent with atelectasis and/or small pleural effusion, new compared to the previous chest x-ray. Suspect small RIGHT pleural effusion as well. Nodular density is again seen overlying the lower margin of the heart shadow on the lateral view, corresponding to 1 of the pulmonary nodules demonstrated on interval chest CT of 07/08/2017, described as not significantly changed on chest CT report dating back to 2004 indicating benignity. No new nodule seen. Mild degenerative change throughout the kyphotic thoracic spine. No acute appearing osseous abnormality. IMPRESSION: 1. Small LEFT pleural effusion and/or atelectasis. Probable small RIGHT pleural effusion. Lungs otherwise clear with no evidence of pneumonia or pulmonary edema. 2. Aortic atherosclerosis. 3. Multiple pulmonary nodules nodules described on earlier chest CT report, better seen on earlier chest CTs. Electronically Signed   By: Franki Cabot M.D.   On: 08/16/2018 13:53   FOLLOW UP PLANS  AND APPOINTMENTS  Allergies as of 08/18/2018      Reactions   Codeine Nausea And Vomiting   Hydrocodone-acetaminophen Nausea And Vomiting      Medication List    STOP taking these medications   Insulin Detemir 100 UNIT/ML Pen Commonly known as:  LEVEMIR   metoprolol tartrate 25 MG tablet Commonly known as:  LOPRESSOR     TAKE these medications   acetaminophen 500 MG tablet Commonly known as:  TYLENOL Take 500 mg by mouth every 6 (six) hours as needed for moderate pain or headache.   ALPRAZolam 0.25 MG tablet Commonly known as:  XANAX Take 1 tablet (0.25 mg total) by mouth at bedtime.   amiodarone 200 MG tablet Commonly known as:  PACERONE Take 0.5 tablets (100 mg total) by mouth daily. What changed:  how much to take   CALCIUM 600-D PO Take 600 mg by mouth daily.   clobetasol cream 0.05 % Commonly known as:  TEMOVATE Apply 1 application topically 2 (two) times daily as needed (itching/ do not use on face).   diltiazem 240 MG 24 hr capsule Commonly known as:  CARDIZEM CD Take 1 capsule (240 mg total) by mouth daily.   glimepiride 2 MG tablet Commonly known as:  AMARYL Take 2 mg by mouth daily.   hydrocortisone cream 1 % Apply 1 application topically 2 (two) times daily.   levothyroxine 100 MCG tablet Commonly known as:  SYNTHROID, LEVOTHROID Take 1 tablet (100 mcg total) by mouth daily before breakfast. What changed:  how much to take   loratadine 10 MG tablet Commonly known as:  CLARITIN Take 10 mg by mouth daily.   meclizine 25 MG tablet Commonly known as:  ANTIVERT Take 25 mg by mouth 4 (four) times daily as needed for dizziness.   metFORMIN 500 MG tablet Commonly known as:  GLUCOPHAGE Take 500 mg by mouth daily. After supper   mupirocin ointment 2 % Commonly known as:  BACTROBAN Apply 1 application topically daily as needed (itching).   OXYGEN Inhale 2 L into the lungs.   pantoprazole 40 MG tablet Commonly known as:  PROTONIX Take 1 tablet  (40 mg total) by mouth daily.   propranolol ER 120 MG 24 hr capsule Commonly known as:  INDERAL LA Take 1 capsule (120 mg total) by mouth daily.   Rivaroxaban 15 MG Tabs tablet Commonly known as:  XARELTO Take 15 mg by mouth daily with supper.   SARNA SENSITIVE 1 % Lotn Generic drug:  pramoxine Apply 1 application topically 3 (three) times daily as needed (itching).      Follow-up Information    Tamsen Roers, MD. Go on 08/19/2018.   Specialty:  Family Medicine Why:  @12 :30pm Contact information: [place] [address] [phone]        Adrian Prows, MD. Call.   Specialty:  Cardiology Why:  To be seen in 2 weeks Contact information: [place] [address] [phone]           Adrian Prows, MD 08/18/2018, 8:50 AM  Pager: [phone] Office: [phone] If no answer: [phone]

## 2018-08-18 NOTE — Progress Notes (Signed)
Physical Therapy Treatment Patient Details Name: [name] MRN: [id number] DOB: October 08, 1934 Today's Date: 08/18/2018    History of Present Illness [name]  is a 84 y.o. female  With chronic atrial fibrillation, low-grade lung carcinoid tumor, moderate coronary artery disease by coronary angiography in 2017 and mild to moderate pulmonary hypertension, DM2 controlled, mild hyperlipidemia who came to ED from PCP due to c/o of severe left-sided chest pain.    PT Comments    Patient progressing with therapy today, chest pain less severe this morning, increasing ambulation distance with supervision and HRmax of 99 this visit, resting was 88 upon entry. Agree with HHPT recs and discussed benefits of cont therapy with patient, she is denying at this time. Eager to return home.      Follow Up Recommendations  Supervision/Assistance - 24 hour;Home health PT     Equipment Recommendations  None recommended by PT    Recommendations for Other Services       Precautions / Restrictions Precautions Precautions: Fall Restrictions Weight Bearing Restrictions: No    Mobility  Bed Mobility Overal bed mobility: Modified Independent                Transfers Overall transfer level: Needs assistance Equipment used: Rolling walker (2 wheeled) Transfers: Sit to/from Stand Sit to Stand: Supervision            Ambulation/Gait Ambulation/Gait assistance: Supervision Gait Distance (Feet): 120 Feet Assistive device: Rolling walker (2 wheeled);None Gait Pattern/deviations: Step-through pattern;Decreased stride length;Narrow base of support Gait velocity: slow   General Gait Details: pt ambulating with RW and supervision. HR resting 88, HRmax 99, DOE 1/4. no balance issues with BUE support.    Stairs             Wheelchair Mobility    Modified Rankin (Stroke Patients Only)       Balance Overall balance assessment: Needs assistance Sitting-balance support:  Feet supported;No upper extremity supported Sitting balance-Leahy Scale: Good     Standing balance support: No upper extremity supported;During functional activity Standing balance-Leahy Scale: Poor                              Cognition Arousal/Alertness: Awake/alert Behavior During Therapy: WFL for tasks assessed/performed Overall Cognitive Status: No family/caregiver present to determine baseline cognitive functioning                                        Exercises      General Comments        Pertinent Vitals/Pain Pain Assessment: Faces Faces Pain Scale: Hurts a little bit Pain Location: chest pain Pain Descriptors / Indicators: Tightness Pain Intervention(s): Limited activity within patient's tolerance;Monitored during session    Home Living                      Prior Function            PT Goals (current goals can now be found in the care plan section) Acute Rehab PT Goals Patient Stated Goal: home and be active PT Goal Formulation: With patient Time For Goal Achievement: 08/31/18 Potential to Achieve Goals: Good Progress towards PT goals: Progressing toward goals    Frequency    Min 3X/week      PT Plan Current plan remains appropriate    Co-evaluation  AM-PAC PT "6 Clicks" Daily Activity  Outcome Measure  Difficulty turning over in bed (including adjusting bedclothes, sheets and blankets)?: Unable Difficulty moving from lying on back to sitting on the side of the bed? : Unable Difficulty sitting down on and standing up from a chair with arms (e.g., wheelchair, bedside commode, etc,.)?: Unable Help needed moving to and from a bed to chair (including a wheelchair)?: A Little Help needed walking in hospital room?: A Little Help needed climbing 3-5 steps with a railing? : A Little 6 Click Score: 12    End of Session Equipment Utilized During Treatment: Gait belt Activity Tolerance: Patient  tolerated treatment well Patient left: in bed;with call bell/phone within reach Nurse Communication: Mobility status PT Visit Diagnosis: Unsteadiness on feet (R26.81);Muscle weakness (generalized) (M62.81)     Time: 1100-3496 PT Time Calculation (min) (ACUTE ONLY): 15 min  Charges:  $Gait Training: 8-22 mins                     Reinaldo Berber, PT, DPT Acute Rehab Services Pager: [phone]    Reinaldo Berber 08/18/2018, 9:16 AM

## 2018-08-18 NOTE — Discharge Summary (Deleted)
Physician Discharge Summary  Patient ID: [name] MRN: [id number] DOB/AGE: Oct 07, 1934 84 y.o.  Admit date: 08/16/2018 Discharge date: 08/18/2018  Primary Discharge Diagnosis 1.  Musculoskeletal chest pain 2.  Atrial fibrillation with rapid ventricular response  Secondary Discharge Diagnosis Hypertension Iatrogenic hyperthyroidism, probably due to increased Synthroid dose, cannot exclude amiodarone interaction. Chronic diastolic heart failure CAD, moderate disease by angiography on 06/24/2016. Anemia of chronic disease, chronic thrombocytopenia without bleeding disorder. Diabetes mellitus type 2 controlled without hyperglycemia with stage III chronic kidney disease. Patient not using any insulin:    Hospital Course:  Patient admitted on 08/16/2018 and discharged home on 08/18/2018 with chest pain associated with shortness of breath.  Chest pain was clearly felt to be musculoskeletal, she was ruled out for myocardial infarction.  She was also found to be in atrial fibrillation with rapid ventricular response, hence kept in the hospital until heart rate was controlled.  During hospitalization she was also found to have markedly reduced TSH, her Synthroid dose was reduced from 150 mcg to 100 mcg daily, metoprolol was discontinued and switched to propranolol LA 120 mg daily and amiodarone was reduced from 200 mg to 100 g daily.  She is recommended to follow-up with her PCP with regard to further management of hyperthyroidism appears to be fairly complex.  She will see me back in the office in 2 weeks.  Continue anticoagulation with Xarelto for chronic atrial fibrillation.  Cardiac studies:  EKG 08/17/2018: A. Fibrillation with controlled V response. No ST T changes of ischemia. Normal QT interval.  .  Echocardiogram 07/09/2017:  Normal LV systolic function, EF 60 to 65% with grade 1 diastolic dysfunction. Moderate aortic stenosis. Mild aortic regurgitation. Peak gradient 31, mean  gradient 19 mmHg, valve area 0.84 cm. Moderate MR, mild MS with mitral valve peak gradient of 10 and mean gradient of 4 mmHg. Calcified mitral annulus. Moderate left atrial enlargement and mild right atrial enlargement, moderate pulmonary hypertension, PA pressure 46 mmHg.  Coronary angiogram 06/24/2016: Mild to moderate diffuse coronary artery disease without high-grade stenosis. Proximal LAD with a 40-50% stenosis, in some views at most 50-60% stenosed and calcified. Small circumflex, mildly diseased large right coronary artery.  Aortic valve not crossed. Difficulty in crossing. Patient has history of mild aortic stenosis by echocardiogram April 2017.  Right heart catheterizationrevealing mild pulmonary hypertension, PA mean pressure of 26 mmHg, wedge mean was 13 mmHg. Cardiac output 3.7, cardiac index 2.3 by Fick. There was no significant shunt, QP: QS 0.79. RA mean 3 mmHg, RA saturation 59%.; RV 34/0, EDP 3 mmHg.; PTA 36/17, mean 26 mmHg. PA saturation 49%.; Pulmonary wedge mean of 13 mmHg. Discharge Exam: Blood pressure 101/64, pulse 92, temperature 97.7 F (36.5 C), temperature source Oral, resp. rate 18, height 5\' 3"  (1.6 m), weight 61.6 kg, SpO2 92 %.  Constitutional: Vital signs are normal. She appears well-nourished.  Moderately built  Eyes: Conjunctivae are normal.  Neck: Neck supple. No JVD present. No thyromegaly present.  Cardiovascular: S1 variable, S2 normal and intact distal pulses. An irregularly irregular rhythm present.  Exam reveals no S3. No murmur heard. Pulmonary/Chest: Effort normal and breath sounds normal.  Abdominal: Soft. Bowel sounds are normal.  Lymphadenopathy:    She has no cervical adenopathy.  Neurological: She is alert.  Skin: Skin is warm and dry.  Psychiatric: She has a normal mood and affect.   Labs:   Lab Results  Component Value Date   WBC 4.7 08/16/2018   HGB 10.7 (L)  08/16/2018   HCT 32.9 (L) 08/16/2018   MCV 89.9 08/16/2018   PLT  139 (L) 08/16/2018    Recent Labs  Lab 08/16/18 1313  NA 142  K 3.6  CL 110  CO2 24  BUN 9  CREATININE 0.93  CALCIUM 9.0  GLUCOSE 169*   BNP (last 3 results) Recent Labs    08/16/18 1413  BNP 308.6*   Cardiac Panel (last 3 results) Recent Labs    08/16/18 1456 08/16/18 2134 08/17/18 0427  TROPONINI <0.03 <0.03 <0.03  TSH Recent Labs    08/16/18 1457  TSH 0.012*   Free T4 0.82 - 1.77 ng/dL 1.67    T3, Free 2.0 - 4.4 pg/mL 3.6    Thyroperoxidase Ab SerPl-aCnc 0 - 34 IU/mL 34    Radiology: Dg Chest 2 View  Result Date: 08/16/2018 CLINICAL DATA:  Chest pain, atrial fibrillation, shortness of breath. EXAM: CHEST - 2 VIEW COMPARISON:  Chest x-ray dated 02/03/2017. FINDINGS: Heart size and mediastinal contours are not significantly changed. Atherosclerotic changes noted at the aortic arch. No new confluent opacity to suggest a developing pneumonia. No evidence of active CHF or pulmonary edema. Blunting at the LEFT costophrenic angle, consistent with atelectasis and/or small pleural effusion, new compared to the previous chest x-ray. Suspect small RIGHT pleural effusion as well. Nodular density is again seen overlying the lower margin of the heart shadow on the lateral view, corresponding to 1 of the pulmonary nodules demonstrated on interval chest CT of 07/08/2017, described as not significantly changed on chest CT report dating back to 2004 indicating benignity. No new nodule seen. Mild degenerative change throughout the kyphotic thoracic spine. No acute appearing osseous abnormality. IMPRESSION: 1. Small LEFT pleural effusion and/or atelectasis. Probable small RIGHT pleural effusion. Lungs otherwise clear with no evidence of pneumonia or pulmonary edema. 2. Aortic atherosclerosis. 3. Multiple pulmonary nodules nodules described on earlier chest CT report, better seen on earlier chest CTs. Electronically Signed   By: Franki Cabot M.D.   On: 08/16/2018 13:53   FOLLOW UP PLANS  AND APPOINTMENTS  Allergies as of 08/18/2018      Reactions   Codeine Nausea And Vomiting   Hydrocodone-acetaminophen Nausea And Vomiting      Medication List    STOP taking these medications   Insulin Detemir 100 UNIT/ML Pen Commonly known as:  LEVEMIR   metoprolol tartrate 25 MG tablet Commonly known as:  LOPRESSOR     TAKE these medications   acetaminophen 500 MG tablet Commonly known as:  TYLENOL Take 500 mg by mouth every 6 (six) hours as needed for moderate pain or headache.   ALPRAZolam 0.25 MG tablet Commonly known as:  XANAX Take 1 tablet (0.25 mg total) by mouth at bedtime.   amiodarone 200 MG tablet Commonly known as:  PACERONE Take 0.5 tablets (100 mg total) by mouth daily. What changed:  how much to take   CALCIUM 600-D PO Take 600 mg by mouth daily.   clobetasol cream 0.05 % Commonly known as:  TEMOVATE Apply 1 application topically 2 (two) times daily as needed (itching/ do not use on face).   diltiazem 240 MG 24 hr capsule Commonly known as:  CARDIZEM CD Take 1 capsule (240 mg total) by mouth daily.   glimepiride 2 MG tablet Commonly known as:  AMARYL Take 2 mg by mouth daily.   hydrocortisone cream 1 % Apply 1 application topically 2 (two) times daily.   levothyroxine 100 MCG tablet Commonly known as:  SYNTHROID, LEVOTHROID Take 1 tablet (100 mcg total) by mouth daily before breakfast. What changed:  how much to take   loratadine 10 MG tablet Commonly known as:  CLARITIN Take 10 mg by mouth daily.   meclizine 25 MG tablet Commonly known as:  ANTIVERT Take 25 mg by mouth 4 (four) times daily as needed for dizziness.   metFORMIN 500 MG tablet Commonly known as:  GLUCOPHAGE Take 500 mg by mouth daily. After supper   mupirocin ointment 2 % Commonly known as:  BACTROBAN Apply 1 application topically daily as needed (itching).   OXYGEN Inhale 2 L into the lungs.   pantoprazole 40 MG tablet Commonly known as:  PROTONIX Take 1 tablet  (40 mg total) by mouth daily.   propranolol ER 120 MG 24 hr capsule Commonly known as:  INDERAL LA Take 1 capsule (120 mg total) by mouth daily.   Rivaroxaban 15 MG Tabs tablet Commonly known as:  XARELTO Take 15 mg by mouth daily with supper.   SARNA SENSITIVE 1 % Lotn Generic drug:  pramoxine Apply 1 application topically 3 (three) times daily as needed (itching).      Follow-up Information    Tamsen Roers, MD. Go on 08/19/2018.   Specialty:  Family Medicine Why:  @12 :30pm Contact information: [place] [address] [phone]        Adrian Prows, MD. Call.   Specialty:  Cardiology Why:  To be seen in 2 weeks Contact information: [place] [address] [phone]           Adrian Prows, MD 08/18/2018, 9:08 AM  Pager: [phone] Office: [phone] If no answer: [phone]

## 2018-08-18 NOTE — Discharge Summary (Deleted)
Admit date: 08/16/2018 Discharge date: 08/18/2018  Primary Discharge Diagnosis 1.  Musculoskeletal chest pain 2.  Atrial fibrillation with rapid ventricular response  Secondary Discharge Diagnosis Hypertension Iatrogenic hyperthyroidism, probably due to increased Synthroid dose, cannot exclude amiodarone interaction. Chronic diastolic heart failure CAD, moderate disease by angiography on 06/24/2016. Anemia of chronic disease, chronic thrombocytopenia without bleeding disorder. Diabetes mellitus type 2 controlled without hyperglycemia with stage III chronic kidney disease. Patient not using any insulin:    Hospital Course:  Patient admitted on 08/16/2018 and discharged home on 08/18/2018 with chest pain associated with shortness of breath.  Chest pain was clearly felt to be musculoskeletal, she was ruled out for myocardial infarction.  She was also found to be in atrial fibrillation with rapid ventricular response, hence kept in the hospital until heart rate was controlled.  During hospitalization she was also found to have markedly reduced TSH, her Synthroid dose was reduced from 150 mcg to 100 mcg daily, metoprolol was discontinued and switched to propranolol LA 120 mg daily and amiodarone was reduced from 200 mg to 100 g daily.  She is recommended to follow-up with her PCP with regard to further management of hyperthyroidism appears to be fairly complex.  She will see me back in the office in 2 weeks.  Continue anticoagulation with Xarelto for chronic atrial fibrillation.  Cardiac studies:  EKG 08/17/2018: A. Fibrillation with controlled V response. No ST T changes of ischemia. Normal QT interval.  .  Echocardiogram 07/09/2017:  Normal LV systolic function, EF 60 to 65% with grade 1 diastolic dysfunction. Moderate aortic stenosis. Mild aortic regurgitation. Peak gradient 31, mean gradient 19 mmHg, valve area 0.84 cm. Moderate MR, mild MS with mitral valve peak gradient of 10 and mean  gradient of 4 mmHg. Calcified mitral annulus. Moderate left atrial enlargement and mild right atrial enlargement, moderate pulmonary hypertension, PA pressure 46 mmHg.  Coronary angiogram 06/24/2016: Mild to moderate diffuse coronary artery disease without high-grade stenosis. Proximal LAD with a 40-50% stenosis, in some views at most 50-60% stenosed and calcified. Small circumflex, mildly diseased large right coronary artery.  Aortic valve not crossed. Difficulty in crossing. Patient has history of mild aortic stenosis by echocardiogram April 2017.  Right heart catheterizationrevealing mild pulmonary hypertension, PA mean pressure of 26 mmHg, wedge mean was 13 mmHg. Cardiac output 3.7, cardiac index 2.3 by Fick. There was no significant shunt, QP: QS 0.79. RA mean 3 mmHg, RA saturation 59%.; RV 34/0, EDP 3 mmHg.; PTA 36/17, mean 26 mmHg. PA saturation 49%.; Pulmonary wedge mean of 13 mmHg. Discharge Exam: Blood pressure 101/64, pulse 92, temperature 97.7 F (36.5 C), temperature source Oral, resp. rate 18, height 5\' 3"  (1.6 m), weight 61.6 kg, SpO2 92 %.  Constitutional: Vital signs are normal. She appears well-nourished.  Moderately built  Eyes: Conjunctivae are normal.  Neck: Neck supple. No JVD present. No thyromegaly present.  Cardiovascular: S1 variable, S2 normal and intact distal pulses. An irregularly irregular rhythm present.  Exam reveals no S3. No murmur heard. Pulmonary/Chest: Effort normal and breath sounds normal.  Abdominal: Soft. Bowel sounds are normal.  Lymphadenopathy:    She has no cervical adenopathy.  Neurological: She is alert.  Skin: Skin is warm and dry.  Psychiatric: She has a normal mood and affect.   Labs:   Lab Results  Component Value Date   WBC 4.7 08/16/2018   HGB 10.7 (L) 08/16/2018   HCT 32.9 (L) 08/16/2018   MCV 89.9 08/16/2018   PLT 139 (  L) 08/16/2018    Recent Labs  Lab 08/16/18 1313  NA 142  K 3.6  CL 110  CO2 24  BUN 9   CREATININE 0.93  CALCIUM 9.0  GLUCOSE 169*   BNP (last 3 results) Recent Labs    08/16/18 1413  BNP 308.6*   Cardiac Panel (last 3 results) Recent Labs    08/16/18 1456 08/16/18 2134 08/17/18 0427  TROPONINI <0.03 <0.03 <0.03  TSH Recent Labs    08/16/18 1457  TSH 0.012*   Free T4 0.82 - 1.77 ng/dL 1.67    T3, Free 2.0 - 4.4 pg/mL 3.6    Thyroperoxidase Ab SerPl-aCnc 0 - 34 IU/mL 34    Radiology: Dg Chest 2 View  Result Date: 08/16/2018 CLINICAL DATA:  Chest pain, atrial fibrillation, shortness of breath. EXAM: CHEST - 2 VIEW COMPARISON:  Chest x-ray dated 02/03/2017. FINDINGS: Heart size and mediastinal contours are not significantly changed. Atherosclerotic changes noted at the aortic arch. No new confluent opacity to suggest a developing pneumonia. No evidence of active CHF or pulmonary edema. Blunting at the LEFT costophrenic angle, consistent with atelectasis and/or small pleural effusion, new compared to the previous chest x-ray. Suspect small RIGHT pleural effusion as well. Nodular density is again seen overlying the lower margin of the heart shadow on the lateral view, corresponding to 1 of the pulmonary nodules demonstrated on interval chest CT of 07/08/2017, described as not significantly changed on chest CT report dating back to 2004 indicating benignity. No new nodule seen. Mild degenerative change throughout the kyphotic thoracic spine. No acute appearing osseous abnormality. IMPRESSION: 1. Small LEFT pleural effusion and/or atelectasis. Probable small RIGHT pleural effusion. Lungs otherwise clear with no evidence of pneumonia or pulmonary edema. 2. Aortic atherosclerosis. 3. Multiple pulmonary nodules nodules described on earlier chest CT report, better seen on earlier chest CTs. Electronically Signed   By: Franki Cabot M.D.   On: 08/16/2018 13:53   FOLLOW UP PLANS AND APPOINTMENTS  Allergies as of 08/18/2018      Reactions   Codeine Nausea And Vomiting    Hydrocodone-acetaminophen Nausea And Vomiting      Medication List    STOP taking these medications   Insulin Detemir 100 UNIT/ML Pen Commonly known as:  LEVEMIR   metoprolol tartrate 25 MG tablet Commonly known as:  LOPRESSOR     TAKE these medications   acetaminophen 500 MG tablet Commonly known as:  TYLENOL Take 500 mg by mouth every 6 (six) hours as needed for moderate pain or headache.   ALPRAZolam 0.25 MG tablet Commonly known as:  XANAX Take 1 tablet (0.25 mg total) by mouth at bedtime.   amiodarone 200 MG tablet Commonly known as:  PACERONE Take 0.5 tablets (100 mg total) by mouth daily. What changed:  how much to take   CALCIUM 600-D PO Take 600 mg by mouth daily.   clobetasol cream 0.05 % Commonly known as:  TEMOVATE Apply 1 application topically 2 (two) times daily as needed (itching/ do not use on face).   diltiazem 240 MG 24 hr capsule Commonly known as:  CARDIZEM CD Take 1 capsule (240 mg total) by mouth daily.   glimepiride 2 MG tablet Commonly known as:  AMARYL Take 2 mg by mouth daily.   hydrocortisone cream 1 % Apply 1 application topically 2 (two) times daily.   levothyroxine 100 MCG tablet Commonly known as:  SYNTHROID, LEVOTHROID Take 1 tablet (100 mcg total) by mouth daily before breakfast. What changed:  how much to take   loratadine 10 MG tablet Commonly known as:  CLARITIN Take 10 mg by mouth daily.   meclizine 25 MG tablet Commonly known as:  ANTIVERT Take 25 mg by mouth 4 (four) times daily as needed for dizziness.   metFORMIN 500 MG tablet Commonly known as:  GLUCOPHAGE Take 500 mg by mouth daily. After supper   mupirocin ointment 2 % Commonly known as:  BACTROBAN Apply 1 application topically daily as needed (itching).   OXYGEN Inhale 2 L into the lungs.   pantoprazole 40 MG tablet Commonly known as:  PROTONIX Take 1 tablet (40 mg total) by mouth daily.   propranolol ER 120 MG 24 hr capsule Commonly known as:   INDERAL LA Take 1 capsule (120 mg total) by mouth daily.   Rivaroxaban 15 MG Tabs tablet Commonly known as:  XARELTO Take 15 mg by mouth daily with supper.   SARNA SENSITIVE 1 % Lotn Generic drug:  pramoxine Apply 1 application topically 3 (three) times daily as needed (itching).      Follow-up Information    Tamsen Roers, MD. Go on 08/19/2018.   Specialty:  Family Medicine Why:  @12 :30pm Contact information: [place] [address] [phone]        Adrian Prows, MD. Call.   Specialty:  Cardiology Why:  To be seen in 2 weeks Contact information: [place] [address] [phone]           Adrian Prows, MD 08/18/2018, 9:08 AM  Pager: [phone] Office: [phone] If no answer: [phone]

## 2019-02-14 ENCOUNTER — Other Ambulatory Visit: Payer: Self-pay | Admitting: Family Medicine

## 2019-02-14 DIAGNOSIS — Z1231 Encounter for screening mammogram for malignant neoplasm of breast: Secondary | ICD-10-CM

## 2019-02-28 ENCOUNTER — Other Ambulatory Visit: Payer: Self-pay

## 2019-02-28 MED ORDER — RIVAROXABAN 15 MG PO TABS: 15.0000 mg | ORAL_TABLET | Freq: Every day | ORAL | 6 refills | Status: DC

## 2019-03-01 ENCOUNTER — Other Ambulatory Visit: Payer: Self-pay

## 2019-03-02 ENCOUNTER — Other Ambulatory Visit: Payer: Self-pay

## 2019-03-02 DIAGNOSIS — I2510 Atherosclerotic heart disease of native coronary artery without angina pectoris: Secondary | ICD-10-CM

## 2019-03-02 MED ORDER — RIVAROXABAN 15 MG PO TABS: 15.0000 mg | ORAL_TABLET | Freq: Every day | ORAL | 6 refills | Status: DC

## 2019-03-17 ENCOUNTER — Inpatient Hospital Stay: Admission: RE | Admit: 2019-03-17 | Payer: Medicare Other | Source: Ambulatory Visit

## 2019-05-24 ENCOUNTER — Other Ambulatory Visit: Payer: Self-pay

## 2019-05-24 MED ORDER — AMIODARONE HCL 200 MG PO TABS: 100.0000 mg | ORAL_TABLET | Freq: Every day | ORAL | 1 refills | Status: DC

## 2019-05-26 ENCOUNTER — Other Ambulatory Visit: Payer: Self-pay | Admitting: Cardiology

## 2019-05-26 MED ORDER — AMIODARONE HCL 200 MG PO TABS: 100.0000 mg | ORAL_TABLET | Freq: Every day | ORAL | 1 refills | Status: DC

## 2019-05-30 ENCOUNTER — Ambulatory Visit: Payer: Medicare Other

## 2019-06-17 ENCOUNTER — Encounter: Payer: Self-pay | Admitting: Cardiology

## 2019-06-18 ENCOUNTER — Ambulatory Visit
Admission: RE | Admit: 2019-06-18 | Discharge: 2019-06-18 | Disposition: A | Discharge status: Home / Self Care | Payer: Medicare Other | Source: Ambulatory Visit | Attending: Family Medicine | Admitting: Family Medicine

## 2019-06-18 ENCOUNTER — Other Ambulatory Visit: Payer: Self-pay

## 2019-06-18 DIAGNOSIS — Z1231 Encounter for screening mammogram for malignant neoplasm of breast: Secondary | ICD-10-CM

## 2019-06-20 ENCOUNTER — Other Ambulatory Visit: Payer: Self-pay

## 2019-06-20 ENCOUNTER — Encounter: Payer: Self-pay | Admitting: Cardiology

## 2019-06-20 ENCOUNTER — Ambulatory Visit (INDEPENDENT_AMBULATORY_CARE_PROVIDER_SITE_OTHER): Payer: Medicare Other | Admitting: Cardiology

## 2019-06-20 VITALS — BP 125/80 | HR 99 | Ht 63.0 in | Wt 144.0 lb

## 2019-06-20 DIAGNOSIS — I4821 Permanent atrial fibrillation: Secondary | ICD-10-CM | POA: Diagnosis not present

## 2019-06-20 DIAGNOSIS — I2510 Atherosclerotic heart disease of native coronary artery without angina pectoris: Secondary | ICD-10-CM

## 2019-06-20 DIAGNOSIS — I350 Nonrheumatic aortic (valve) stenosis: Secondary | ICD-10-CM

## 2019-06-20 DIAGNOSIS — R0609 Other forms of dyspnea: Secondary | ICD-10-CM

## 2019-06-20 DIAGNOSIS — E7800 Pure hypercholesterolemia, unspecified: Secondary | ICD-10-CM

## 2019-06-20 DIAGNOSIS — I10 Essential (primary) hypertension: Secondary | ICD-10-CM

## 2019-06-20 DIAGNOSIS — G4734 Idiopathic sleep related nonobstructive alveolar hypoventilation: Secondary | ICD-10-CM

## 2019-06-20 DIAGNOSIS — E118 Type 2 diabetes mellitus with unspecified complications: Secondary | ICD-10-CM

## 2019-06-20 NOTE — Progress Notes (Addendum)
Primary Physician/Referring:  Tamsen Roers, MD  Patient ID: [name], female    DOB: [birth year], 85 y.o.   MRN: [id number]  Chief Complaint  Patient presents with  . Hypertension  . Coronary Artery Disease  . Follow-up   HPI: [name]  is a 85 y.o. female Caucasian patient with rare form of low-grade lung carcinoid, permanent atrial fibrillation, moderate diffuse coronary artery disease, asymptomatic right subclavian artery stenosis, hypertension, type II diabetes mellitus, hyperlipidemia. She is statin intolerant.   Patient presents here for six-month follow-up visit, recently diagnosed with left ear infection, still has pain in the ears and has some difficulty hearing.  Otherwise today except for occasional dizziness states that she has not had any further chest pain and states that her dyspnea has remained stable and she is feeling the best she has but is stressed out about coronavirus and social distancing.  She is also worried about her husband getting more advanced dementia.  Past Medical History:  Diagnosis Date  . Acute diverticulitis 06/2017  . Anemia 06/2017  . ARF (acute renal failure) ([place]) 06/2017  . CAD (coronary artery disease) 03/13/2012  . Diabetes mellitus   . Dysrhythmia    ATRIAL FIB  . Fall 07/07/2017  . HTN (hypertension) 03/13/2012  . Hypothyroid 03/13/2012  . Lung cancer ([place])   . Shortness of breath   . Stroke ([place])   . Syncope and collapse 04/02/2016  . Type II or unspecified type diabetes mellitus without mention of complication, not stated as uncontrolled 03/13/2012    Past Surgical History:  Procedure Laterality Date  . ABDOMINAL HYSTERECTOMY    . BREAST EXCISIONAL BIOPSY Left   . BREAST SURGERY     benign tumors removed in 1980  . CARDIAC CATHETERIZATION    . CARDIOVERSION  04/20/2012   Procedure: CARDIOVERSION;  Surgeon: Laverda Page, MD;  Location: [place];  Service: Cardiovascular;  Laterality: N/A;  . CARDIOVERSION N/A  05/10/2013   Procedure: CARDIOVERSION;  Surgeon: Laverda Page, MD;  Location: [place];  Service: Cardiovascular;  Laterality: N/A;  . CHOLECYSTECTOMY    . HEMORRHOID SURGERY    . jaw replacement    . LEFT HEART CATHETERIZATION WITH CORONARY ANGIOGRAM N/A 04/20/2015   Procedure: LEFT HEART CATHETERIZATION WITH CORONARY ANGIOGRAM;  Surgeon: Adrian Prows, MD;  Location[facility] CATH LAB;  Service: Cardiovascular;  Laterality: N/A;  . tumors     back of head    Social History   Socioeconomic History  . Marital status: Married    Spouse name: Not on file  . Number of children: 3  . Years of education: Not on file  . Highest education level: Not on file  Occupational History  . Not on file  Social Needs  . Financial resource strain: Not on file  . Food insecurity    Worry: Not on file    Inability: Not on file  . Transportation needs    Medical: Not on file    Non-medical: Not on file  Tobacco Use  . Smoking status: Never Smoker  . Smokeless tobacco: Never Used  Substance and Sexual Activity  . Alcohol use: No  . Drug use: No  . Sexual activity: Not on file  Lifestyle  . Physical activity    Days per week: Not on file    Minutes per session: Not on file  . Stress: Not on file  Relationships  . Social connections    Talks on phone: Not on  file    Gets together: Not on file    Attends religious service: Not on file    Active member of club or organization: Not on file    Attends meetings of clubs or organizations: Not on file    Relationship status: Not on file  . Intimate partner violence    Fear of current or ex partner: Not on file    Emotionally abused: Not on file    Physically abused: Not on file    Forced sexual activity: Not on file  Other Topics Concern  . Not on file  Social History Narrative  . Not on file    Review of Systems  Constitution: Positive for malaise/fatigue. Negative for chills, decreased appetite and weight gain.  HENT: Positive for ear  pain (left).   Cardiovascular: Positive for dyspnea on exertion. Negative for leg swelling and syncope.  Endocrine: Negative for cold intolerance.  Hematologic/Lymphatic: Does not bruise/bleed easily.  Musculoskeletal: Negative for joint swelling.  Gastrointestinal: Negative for abdominal pain, anorexia, change in bowel habit, hematochezia and melena.  Neurological: Positive for dizziness. Negative for headaches.  Psychiatric/Behavioral: Negative for depression and substance abuse.  All other systems reviewed and are negative.     Objective  Blood pressure 125/80, pulse 99, height '5\' 3"'  (1.6 m), weight 144 lb (65.3 kg), SpO2 97 %. Body mass index is 25.51 kg/m.   Physical Exam  Constitutional: She appears well-nourished. No distress.  Moderately built  HENT:  Head: Atraumatic.  Eyes: Conjunctivae are normal.  Neck: Neck supple. No JVD present. No thyromegaly present.  Cardiovascular: Intact distal pulses and normal pulses. An irregularly irregular rhythm present. Exam reveals distant heart sounds. Exam reveals no gallop, no S3 and no S4.  Murmur heard. High-pitched midsystolic murmur is present with a grade of 2/6 at the upper right sternal border. Pulses:      Carotid pulses are 2+ on the right side and 2+ on the left side. S1 is variable, S2 is normal. No edema, No JVD.   Pulmonary/Chest: Effort normal and breath sounds normal.  Abdominal: Soft. Bowel sounds are normal.  Musculoskeletal: Normal range of motion.        General: No edema.  Neurological: She is alert.  Skin: Skin is warm and dry.  Psychiatric: She has a normal mood and affect.   Radiology: No results found.  Laboratory examination:   CMP Latest Ref Rng & Units 06/20/2019 08/16/2018 07/08/2017  Glucose 65 - 99 mg/dL 114(H) 169(H) 179(H)  BUN 8 - 27 mg/dL '19 9 15  ' Creatinine 0.57 - 1.00 mg/dL 1.77(H) 0.93 1.21(H)  Sodium 134 - 144 mmol/L 141 142 139  Potassium 3.5 - 5.2 mmol/L 4.1 3.6 3.7  Chloride 96 - 106  mmol/L 103 110 108  CO2 20 - 29 mmol/L '22 24 23  ' Calcium 8.7 - 10.3 mg/dL 8.8 9.0 7.8(L)  Total Protein 6.0 - 8.5 g/dL 6.6 - 5.4(L)  Total Bilirubin 0.0 - 1.2 mg/dL 0.5 - 0.7  Alkaline Phos 39 - 117 IU/L 66 - 64  AST 0 - 40 IU/L 21 - 15  ALT 0 - 32 IU/L 14 - 11(L)   CBC Latest Ref Rng & Units 06/20/2019 08/16/2018 07/08/2017  WBC 3.4 - 10.8 x10E3/uL 5.4 4.7 4.4  Hemoglobin 11.1 - 15.9 g/dL 12.0 10.7(L) 10.6(L)  Hematocrit 34.0 - 46.6 % 36.1 32.9(L) 32.0(L)  Platelets 150 - 450 x10E3/uL 130(L) 139(L) 94(L)   Lipid Panel   Labs 08/16/2018: Serum glucose 169 mg,  BUN 9, creatinine 0.93, eGFR 56 mL, HB 10.7/HCT 32.9, platelets 139.  TSH markedly reduced at 0.012.  Total cholesterol 120, triglycerides 130, HDL 32, LDL 62.  HEMOGLOBIN A1C Lab Results  Component Value Date   HGBA1C 7.8 (H) 06/20/2019   MPG 183 12/13/2016   TSH Recent Labs    08/16/18 1457 06/20/19 0935  TSH 0.012* 15.720*     Medications   Medications Discontinued During This Encounter  Medication Reason  . ALPRAZolam (XANAX) 0.25 MG tablet   . loratadine (CLARITIN) 10 MG tablet Error  . metFORMIN (GLUCOPHAGE) 500 MG tablet Error  . OXYGEN Error  . pantoprazole (PROTONIX) 40 MG tablet Error  . propranolol ER (INDERAL LA) 120 MG 24 hr capsule Error  . diltiazem (CARDIZEM CD) 240 MG 24 hr capsule Error   Current Meds  Medication Sig  . acetaminophen (TYLENOL) 500 MG tablet Take 500 mg by mouth every 6 (six) hours as needed for moderate pain or headache.   Marland Kitchen amiodarone (PACERONE) 200 MG tablet Take 0.5 tablets (100 mg total) by mouth daily.  Marland Kitchen b complex vitamins tablet Take 1 tablet by mouth daily.  . Calcium Carb-Cholecalciferol (CALCIUM 600-D PO) Take 600 mg by mouth daily.  . cefdinir (OMNICEF) 300 MG capsule Take 300 mg by mouth 2 (two) times daily.  . Cholecalciferol (VITAMIN D3) 25 MCG (1000 UT) CHEW Chew by mouth daily.  . clobetasol cream (TEMOVATE) 8.58 % Apply 1 application topically 2 (two) times  daily as needed (itching/ do not use on face).  Marland Kitchen glimepiride (AMARYL) 2 MG tablet Take 2 mg by mouth daily.  . hydrochlorothiazide (MICROZIDE) 12.5 MG capsule Take 12.5 mg by mouth daily.  . hydrocortisone cream 1 % Apply 1 application topically 2 (two) times daily.  Marland Kitchen levocetirizine (XYZAL) 5 MG tablet Take 5 mg by mouth every evening.  Marland Kitchen levothyroxine (SYNTHROID, LEVOTHROID) 100 MCG tablet Take 1 tablet (100 mcg total) by mouth daily before breakfast. (Patient taking differently: Take 150 mcg by mouth daily before breakfast. )  . meclizine (ANTIVERT) 25 MG tablet Take 25 mg by mouth 4 (four) times daily as needed for dizziness.  . mupirocin ointment (BACTROBAN) 2 % Apply 1 application topically daily as needed (itching).   . pramoxine (SARNA SENSITIVE) 1 % LOTN Apply 1 application topically 3 (three) times daily as needed (itching).  . Rivaroxaban (XARELTO) 15 MG TABS tablet Take 1 tablet (15 mg total) by mouth daily with supper.  . [DISCONTINUED] diltiazem (CARDIZEM CD) 240 MG 24 hr capsule Take 1 capsule (240 mg total) by mouth daily.  . [DISCONTINUED] OXYGEN Inhale 2 L into the lungs.    Cardiac Studies:   Lexiscan myoview stress test 04/26/2018:  1. Lexiscan stress test performed. Exercise capacity not assessed. Stress symptoms included dizziness and chest pain/. Normal blood pressure. The resting electrocardiogram demonstrated atrial fibrillation with rapid ventricular rate, normal resting conduction and nonspecific ST-T changes.  Stress EKG is non diagnostic for ischemia as it is a pharmacologic stress.  2. The overall quality of the study is excellent. There is no evidence of abnormal lung activity. Stress and rest SPECT images demonstrate homogeneous tracer distribution throughout the myocardium. Gated SPECT imaging reveals normal myocardial thickening and wall motion. The left ventricular ejection fraction was normal (65%).   3. Low risk study.   Coronary angiogram  06/24/2016 Mild  pulmonary hypertension suggestive of primary pulmonary hypertension, mean PA pressure of 26 mmHg.  proximal LAD revealing a 50% to 60% stenosis  and calcified, No significant change from 04/20/2015.  Mild aortic stenosis with a 12 mm pressure gradient across the aortic valve.  Echocardiogram 04/19/2015: Normal LV systolic function, moderately calcified aortic valve.  Mild aortic stenosis.  Aortic valve area 1.0 cm, peak gradient of 26 and mean gradient of 12 mmHg.  Moderate pulmonary hypertension, PA pressure estimated at 41 mmHg.  Assessment   Permanent atrial fibrillation - CHA2DS2-VASc Score is 6 with yearly risk of stroke of 9.8%. - Plan: EKG 12-Lead, CBC, TSH+T4F+T3Free,   Coronary artery disease involving native coronary artery of native heart without angina pectoris - Prox LAD 50-60% 2017   Mild aortic stenosis   Hypercholesteremia - Plan: Lipid Panel With LDL/HDL Ratio  Dyspnea on exertion   Nocturnal hypoxemia - On O2 suppliment 10/30/14   Essential hypertension - Plan: CMP14+EGFR  Diabetes mellitus with complication ([place]) - Plan: Hgb A1c w/o eAG  EKG 06/20/2019: Atrial fibrillation with controlled ventricular response at the rate of 89 bpm, normal axis, poor R-wave progression, cannot exclude anteroseptal infarct old.  Nonspecific T abnormality.  Low-voltage complexes, pulmonary disease pattern. No significant change from  EKG 12/17/2018, rate controlled.  Recommendations:   Patient is presently doing well, she has not had any recurrence of angina pectoris and dyspnea has remained stable and on physical exam aortic stenosis does not appear to be any severe.  She is now tolerating amiodarone with good control of atrial fibrillation heart rate.  Previously without amiodarone she had rapid ventricular response on the EKG.  However I am concerned about her abnormal TSH related to amiodarone hence I will obtain labs today, lipids, CMP, CBC and TSH will be obtained along with A1c.  I will  send a copy to her PCP as well.  Her lipids were previously normal.  She is using oxygen at night for nocturnal hypoxemia and does appear to help her with regard to feeling much more energetic during the daytime.  I will see her back in 6 months or sooner if problems.  Adrian Prows, MD, [facility] 06/21/2019, 8:18 AM [facility] [place] Pager: [phone] Office: [phone] If no answer Cell [phone]  Addendum: Lipids are now uncontrolled with elevated LDL, she has developed new renal insufficiency, stage IV, CBC normal and hemoglobin is improved from 10.7 previously.  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  Diabetes is uncontrolled.  She is not on any nephrotoxic agents. We will forward a copy to PCP, will discuss with the patient regarding statins. Patient had discontinued most of her medications on her last office visit.  I d/w patient and it is hard to explain over telephone and she also has hearing loss. I will repeat BMP in 2-3 weeks and see her back in one month. Will address statins.

## 2019-06-21 LAB — CMP14+EGFR
ALT: 14 IU/L (ref 0–32)
AST: 21 IU/L (ref 0–40)
Albumin/Globulin Ratio: 2.3 — ABNORMAL HIGH (ref 1.2–2.2)
Albumin: 4.6 g/dL (ref 3.6–4.6)
Alkaline Phosphatase: 66 IU/L (ref 39–117)
BUN/Creatinine Ratio: 11 — ABNORMAL LOW (ref 12–28)
BUN: 19 mg/dL (ref 8–27)
Bilirubin Total: 0.5 mg/dL (ref 0.0–1.2)
CO2: 22 mmol/L (ref 20–29)
Calcium: 8.8 mg/dL (ref 8.7–10.3)
Chloride: 103 mmol/L (ref 96–106)
Creatinine, Ser: 1.77 mg/dL — ABNORMAL HIGH (ref 0.57–1.00)
GFR calc Af Amer: 30 mL/min/{1.73_m2} — ABNORMAL LOW (ref >59)
GFR calc non Af Amer: 26 mL/min/{1.73_m2} — ABNORMAL LOW (ref >59)
Globulin, Total: 2 g/dL (ref 1.5–4.5)
Glucose: 114 mg/dL — ABNORMAL HIGH (ref 65–99)
Potassium: 4.1 mmol/L (ref 3.5–5.2)
Sodium: 141 mmol/L (ref 134–144)
Total Protein: 6.6 g/dL (ref 6.0–8.5)

## 2019-06-21 LAB — LIPID PANEL WITH LDL/HDL RATIO
Cholesterol, Total: 189 mg/dL (ref 100–199)
HDL: 43 mg/dL (ref >39)
LDL Calculated: 124 mg/dL — ABNORMAL HIGH (ref 0–99)
LDl/HDL Ratio: 2.9 ratio (ref 0.0–3.2)
Triglycerides: 112 mg/dL (ref 0–149)
VLDL Cholesterol Cal: 22 mg/dL (ref 5–40)

## 2019-06-21 LAB — CBC
Hematocrit: 36.1 % (ref 34.0–46.6)
Hemoglobin: 12 g/dL (ref 11.1–15.9)
MCH: 31.1 pg (ref 26.6–33.0)
MCHC: 33.2 g/dL (ref 31.5–35.7)
MCV: 94 fL (ref 79–97)
Platelets: 130 10*3/uL — ABNORMAL LOW (ref 150–450)
RBC: 3.86 x10E6/uL (ref 3.77–5.28)
RDW: 13.9 % (ref 11.7–15.4)
WBC: 5.4 10*3/uL (ref 3.4–10.8)

## 2019-06-21 LAB — HGB A1C W/O EAG: Hgb A1c MFr Bld: 7.8 % — ABNORMAL HIGH (ref 4.8–5.6)

## 2019-06-21 LAB — TSH+T4F+T3FREE
Free T4: 1.6 ng/dL (ref 0.82–1.77)
T3, Free: 2.1 pg/mL (ref 2.0–4.4)
TSH: 15.72 u[IU]/mL — ABNORMAL HIGH (ref 0.450–4.500)

## 2019-06-21 NOTE — Addendum Note (Signed)
Addended by: Kela Millin on: 06/21/2019 08:29 AM   Modules accepted: Orders

## 2019-07-05 ENCOUNTER — Other Ambulatory Visit: Payer: Self-pay | Admitting: Cardiology

## 2019-07-06 LAB — BASIC METABOLIC PANEL (7)
BUN/Creatinine Ratio: 16 (ref 12–28)
BUN: 25 mg/dL (ref 8–27)
CO2: 22 mmol/L (ref 20–29)
Chloride: 104 mmol/L (ref 96–106)
Creatinine, Ser: 1.53 mg/dL — ABNORMAL HIGH (ref 0.57–1.00)
GFR calc Af Amer: 36 mL/min/{1.73_m2} — ABNORMAL LOW (ref >59)
GFR calc non Af Amer: 31 mL/min/{1.73_m2} — ABNORMAL LOW (ref >59)
Glucose: 116 mg/dL — ABNORMAL HIGH (ref 65–99)
Potassium: 4.8 mmol/L (ref 3.5–5.2)
Sodium: 142 mmol/L (ref 134–144)

## 2019-08-02 ENCOUNTER — Encounter: Payer: Self-pay | Admitting: Cardiology

## 2019-08-02 ENCOUNTER — Other Ambulatory Visit: Payer: Self-pay

## 2019-08-02 ENCOUNTER — Ambulatory Visit: Payer: Medicare Other | Admitting: Cardiology

## 2019-08-02 VITALS — BP 146/82 | Temp 81.0°F | Ht 63.0 in | Wt 144.0 lb

## 2019-08-02 DIAGNOSIS — R0789 Other chest pain: Secondary | ICD-10-CM

## 2019-08-02 DIAGNOSIS — I2510 Atherosclerotic heart disease of native coronary artery without angina pectoris: Secondary | ICD-10-CM | POA: Diagnosis not present

## 2019-08-02 DIAGNOSIS — N183 Chronic kidney disease, stage 3 unspecified: Secondary | ICD-10-CM

## 2019-08-02 DIAGNOSIS — E7800 Pure hypercholesterolemia, unspecified: Secondary | ICD-10-CM | POA: Diagnosis not present

## 2019-08-02 DIAGNOSIS — I4821 Permanent atrial fibrillation: Secondary | ICD-10-CM | POA: Diagnosis not present

## 2019-08-02 MED ORDER — RIVAROXABAN 15 MG PO TABS: 15.0000 mg | ORAL_TABLET | Freq: Every day | ORAL | 1 refills | Status: DC

## 2019-08-02 NOTE — Progress Notes (Signed)
Primary Physician/Referring:  Tamsen Roers, MD  Patient ID: [name], female    DOB: 29-Jun-1934, 85 y.o.   MRN: [id number]  Chief Complaint  Patient presents with  . Atrial Fibrillation    f/u labs    HPI: [name]  is a 85 y.o. female Caucasian patient with rare form of low-grade lung carcinoid, permanent atrial fibrillation, moderate diffuse coronary artery disease, asymptomatic right subclavian artery stenosis, hypertension, type II diabetes mellitus, hyperlipidemia.   Patient was last seen 6 weeks ago, noted to have worsening kidney function and abnormal lipids. She is not on any nephrotoxic agents. She had labs rechecked and now presents for follow up.  She is doing well, no specific complaints today. Has occasional sharp chest pain on occasion. Not associated with activity. Dyspnea has remained stable. She has been push mowing her yard, which she tolerates well. Her husband is not in good health, that does concern her.   BP ranging from 970-263 systolic. HR ranging from 70 to low 100's. She was previously on Lipitor, but had myalgia. She is currently not on statin.  Past Medical History:  Diagnosis Date  . Acute diverticulitis 06/2017  . Anemia 06/2017  . ARF (acute renal failure) ([place]) 06/2017  . CAD (coronary artery disease) 03/13/2012  . Diabetes mellitus   . Dysrhythmia    ATRIAL FIB  . Fall 07/07/2017  . HTN (hypertension) 03/13/2012  . Hypothyroid 03/13/2012  . Lung cancer ([place])   . Shortness of breath   . Stroke ([place])   . Syncope and collapse 04/02/2016  . Type II or unspecified type diabetes mellitus without mention of complication, not stated as uncontrolled 03/13/2012    Past Surgical History:  Procedure Laterality Date  . ABDOMINAL HYSTERECTOMY    . BREAST EXCISIONAL BIOPSY Left   . BREAST SURGERY     benign tumors removed in 1980  . CARDIAC CATHETERIZATION    . CARDIOVERSION  04/20/2012   Procedure: CARDIOVERSION;  Surgeon: Laverda Page,  MD;  Location: [place];  Service: Cardiovascular;  Laterality: N/A;  . CARDIOVERSION N/A 05/10/2013   Procedure: CARDIOVERSION;  Surgeon: Laverda Page, MD;  Location: [place];  Service: Cardiovascular;  Laterality: N/A;  . CHOLECYSTECTOMY    . HEMORRHOID SURGERY    . jaw replacement    . LEFT HEART CATHETERIZATION WITH CORONARY ANGIOGRAM N/A 04/20/2015   Procedure: LEFT HEART CATHETERIZATION WITH CORONARY ANGIOGRAM;  Surgeon: Adrian Prows, MD;  Location: [facility] CATH LAB;  Service: Cardiovascular;  Laterality: N/A;  . tumors     back of head    Social History   Socioeconomic History  . Marital status: Married    Spouse name: Not on file  . Number of children: 3  . Years of education: Not on file  . Highest education level: Not on file  Occupational History  . Not on file  Social Needs  . Financial resource strain: Not on file  . Food insecurity    Worry: Not on file    Inability: Not on file  . Transportation needs    Medical: Not on file    Non-medical: Not on file  Tobacco Use  . Smoking status: Never Smoker  . Smokeless tobacco: Never Used  Substance and Sexual Activity  . Alcohol use: No  . Drug use: No  . Sexual activity: Not on file  Lifestyle  . Physical activity    Days per week: Not on file    Minutes per  session: Not on file  . Stress: Not on file  Relationships  . Social Herbalist on phone: Not on file    Gets together: Not on file    Attends religious service: Not on file    Active member of club or organization: Not on file    Attends meetings of clubs or organizations: Not on file    Relationship status: Not on file  . Intimate partner violence    Fear of current or ex partner: Not on file    Emotionally abused: Not on file    Physically abused: Not on file    Forced sexual activity: Not on file  Other Topics Concern  . Not on file  Social History Narrative  . Not on file    Review of Systems  Constitution: Positive for  malaise/fatigue. Negative for chills, decreased appetite and weight gain.  HENT: Negative for ear pain (left).   Cardiovascular: Positive for chest pain, dyspnea on exertion and palpitations (occasional). Negative for leg swelling and syncope.  Endocrine: Negative for cold intolerance.  Hematologic/Lymphatic: Does not bruise/bleed easily.  Musculoskeletal: Negative for joint swelling.  Gastrointestinal: Negative for abdominal pain, anorexia, change in bowel habit, hematochezia and melena.  Neurological: Negative for dizziness and headaches.  Psychiatric/Behavioral: Negative for depression and substance abuse.  All other systems reviewed and are negative.     Objective  Blood pressure (!) 146/82, temperature (!) 81 F (27.2 C), height _0  (1.6 m), weight 144 lb (65.3 kg), SpO2 96 %. Body mass index is 25.51 kg/m.      Physical Exam  Constitutional: She appears well-nourished. No distress.  Moderately built  HENT:  Head: Atraumatic.  Eyes: Conjunctivae are normal.  Neck: Neck supple. No JVD present. No thyromegaly present.  Cardiovascular: Intact distal pulses and normal pulses. An irregularly irregular rhythm present. Exam reveals distant heart sounds. Exam reveals no gallop, no S3 and no S4.  Murmur heard. High-pitched midsystolic murmur is present with a grade of 2/6 at the upper right sternal border. Pulses:      Carotid pulses are 2+ on the right side and 2+ on the left side. S1 is variable, S2 is normal. No edema, No JVD.   Pulmonary/Chest: Effort normal and breath sounds normal.  Abdominal: Soft. Bowel sounds are normal.  Musculoskeletal: Normal range of motion.        General: No edema.  Neurological: She is alert.  Skin: Skin is warm and dry.  Psychiatric: She has a normal mood and affect.   Radiology: No results found.  Laboratory examination:   CMP Latest Ref Rng & Units 07/05/2019 06/20/2019 08/16/2018  Glucose 65 - 99 mg/dL 116(H) 114(H) 169(H)  BUN 8 - 27  mg/dL _1 Creatinine 0.57 - 1.00 mg/dL 1.53(H) 1.77(H) 0.93  Sodium 134 - 144 mmol/L 142 141 142  Potassium 3.5 - 5.2 mmol/L 4.8 4.1 3.6  Chloride 96 - 106 mmol/L 104 103 110  CO2 20 - 29 mmol/L _2 Calcium 8.7 - 10.3 mg/dL - 8.8 9.0  Total Protein 6.0 - 8.5 g/dL - 6.6 -  Total Bilirubin 0.0 - 1.2 mg/dL - 0.5 -  Alkaline Phos 39 - 117 IU/L - 66 -  AST 0 - 40 IU/L - 21 -  ALT 0 - 32 IU/L - 14 -   CBC Latest Ref Rng & Units 06/20/2019 08/16/2018 07/08/2017  WBC 3.4 - 10.8 x10E3/uL 5.4 4.7 4.4  Hemoglobin 11.1 -  15.9 g/dL 12.0 10.7(L) 10.6(L)  Hematocrit 34.0 - 46.6 % 36.1 32.9(L) 32.0(L)  Platelets 150 - 450 x10E3/uL 130(L) 139(L) 94(L)   Lipid Panel  Lipid Panel     Component Value Date/Time   CHOL 189 06/20/2019 0935   TRIG 112 06/20/2019 0935   HDL 43 06/20/2019 0935   CHOLHDL 3.8 12/16/2016 0312   VLDL 26 12/16/2016 0312   LDLCALC 124 (H) 06/20/2019 0935   Labs 08/16/2018: Serum glucose 169 mg, BUN 9, creatinine 0.93, eGFR 56 mL, HB 10.7/HCT 32.9, platelets 139.  TSH markedly reduced at 0.012.  Total cholesterol 120, triglycerides 130, HDL 32, LDL 62.  HEMOGLOBIN A1C Lab Results  Component Value Date   HGBA1C 7.8 (H) 06/20/2019   MPG 183 12/13/2016   TSH Recent Labs    08/16/18 1457 06/20/19 0935  TSH 0.012* 15.720*     Medications   Medications Discontinued During This Encounter  Medication Reason  . DILT-XR 180 MG 24 hr capsule No longer needed (for PRN medications)  . cefdinir (OMNICEF) 300 MG capsule Error   Current Meds  Medication Sig  . acetaminophen (TYLENOL) 500 MG tablet Take 500 mg by mouth every 6 (six) hours as needed for moderate pain or headache.   Marland Kitchen amiodarone (PACERONE) 200 MG tablet Take 0.5 tablets (100 mg total) by mouth daily.  Marland Kitchen b complex vitamins tablet Take 1 tablet by mouth daily.  . Calcium Carb-Cholecalciferol (CALCIUM 600-D PO) Take 600 mg by mouth daily.  . Cholecalciferol (VITAMIN D3) 25 MCG (1000 UT) CHEW Chew by  mouth daily.  . clobetasol cream (TEMOVATE) 2.53 % Apply 1 application topically 2 (two) times daily as needed (itching/ do not use on face).  Marland Kitchen diltiazem (DILACOR XR) 180 MG 24 hr capsule Take 180 mg by mouth daily.  Marland Kitchen glimepiride (AMARYL) 2 MG tablet Take 2 mg by mouth daily.  . hydrochlorothiazide (MICROZIDE) 12.5 MG capsule Take 12.5 mg by mouth daily.  Marland Kitchen levocetirizine (XYZAL) 5 MG tablet Take 5 mg by mouth every evening.  Marland Kitchen levothyroxine (SYNTHROID, LEVOTHROID) 100 MCG tablet Take 1 tablet (100 mcg total) by mouth daily before breakfast. (Patient taking differently: Take 150 mcg by mouth daily before breakfast. )  . meclizine (ANTIVERT) 25 MG tablet Take 25 mg by mouth 4 (four) times daily as needed for dizziness.  . mupirocin ointment (BACTROBAN) 2 % Apply 1 application topically daily as needed (itching).   . pramoxine (SARNA SENSITIVE) 1 % LOTN Apply 1 application topically 3 (three) times daily as needed (itching).  . Rivaroxaban (XARELTO) 15 MG TABS tablet Take 1 tablet (15 mg total) by mouth daily with supper.  . [DISCONTINUED] Rivaroxaban (XARELTO) 15 MG TABS tablet Take 1 tablet (15 mg total) by mouth daily with supper.    Cardiac Studies:   Lexiscan myoview stress test 04/26/2018:  1. Lexiscan stress test performed. Exercise capacity not assessed. Stress symptoms included dizziness and chest pain/. Normal blood pressure. The resting electrocardiogram demonstrated atrial fibrillation with rapid ventricular rate, normal resting conduction and nonspecific ST-T changes.  Stress EKG is non diagnostic for ischemia as it is a pharmacologic stress.  2. The overall quality of the study is excellent. There is no evidence of abnormal lung activity. Stress and rest SPECT images demonstrate homogeneous tracer distribution throughout the myocardium. Gated SPECT imaging reveals normal myocardial thickening and wall motion. The left ventricular ejection fraction was normal (65%).   3. Low risk  study.   Coronary angiogram  06/24/2016 Mild pulmonary  hypertension suggestive of primary pulmonary hypertension, mean PA pressure of 26 mmHg.  proximal LAD revealing a 50% to 60% stenosis and calcified, No significant change from 04/20/2015.  Mild aortic stenosis with a 12 mm pressure gradient across the aortic valve.  Echocardiogram 04/19/2015: Normal LV systolic function, moderately calcified aortic valve.  Mild aortic stenosis.  Aortic valve area 1.0 cm, peak gradient of 26 and mean gradient of 12 mmHg.  Moderate pulmonary hypertension, PA pressure estimated at 41 mmHg.  Assessment   Permanent atrial fibrillation - CHA2DS2-VASc Score is 6 with yearly risk of stroke of 9.8%. - Plan: EKG 12-Lead, CBC, TSH+T4F+T3Free,   Coronary artery disease involving native coronary artery of native heart without angina pectoris - Prox LAD 50-60% 2017   Mild aortic stenosis   Hypercholesteremia - Plan: Lipid Panel With LDL/HDL Ratio  Dyspnea on exertion   Nocturnal hypoxemia - On O2 suppliment 10/30/14   Essential hypertension - Plan: CMP14+EGFR  Diabetes mellitus with complication ([place]) - Plan: Hgb A1c w/o eAG  EKG 06/20/2019: Atrial fibrillation with controlled ventricular response at the rate of 89 bpm, normal axis, poor R-wave progression, cannot exclude anteroseptal infarct old.  Nonspecific T abnormality.  Low-voltage complexes, pulmonary disease pattern. No significant change from  EKG 12/17/2018, rate controlled.  Recommendations:   Patient seen today to discuss lab results. Kidney function appears to be improving. She will need repeat labs in the next few weeks, not on any nephrotoxic agents. She is on low dose amiodarone, with newly noted TSH elevation. Labs have also been forwarded to PCP. Will continue to monitor. Heart rate has been much better controlled with amiodarone.  She has occasional, atypical chest pain that is not associated with exertion. She has had negative nuclear stress  test in 2019. Dyspnea has been stable. She has been push mowing her yard and tolerates this well. Will continue to monitor.   In regards to lipids, she has previously not tolerated Lipitor due to myalgia. It does not appear that she has tried crestor. Will start low dose and recheck lipids in 8 weeks. If unable to tolerate, will try Livalo. She will benefit from statin therapy in view of CAD history. Will see her back after labs in 8 weeks.  Miquel Dunn, MSN, APRN, FNP-C [facility]. [place] Office: [phone] Fax: [phone]

## 2019-08-03 MED ORDER — ROSUVASTATIN CALCIUM 20 MG PO TABS: 20.0000 mg | ORAL_TABLET | Freq: Every day | ORAL | 2 refills | Status: DC

## 2019-08-06 ENCOUNTER — Encounter: Payer: Self-pay | Admitting: Cardiology

## 2019-09-12 ENCOUNTER — Other Ambulatory Visit: Payer: Self-pay | Admitting: Cardiology

## 2019-09-13 LAB — LIPID PANEL
Chol/HDL Ratio: 2.3 ratio (ref 0.0–4.4)
Cholesterol, Total: 116 mg/dL (ref 100–199)
HDL: 50 mg/dL (ref >39)
LDL Chol Calc (NIH): 46 mg/dL (ref 0–99)
Triglycerides: 109 mg/dL (ref 0–149)
VLDL Cholesterol Cal: 20 mg/dL (ref 5–40)

## 2019-09-13 LAB — COMPREHENSIVE METABOLIC PANEL
ALT: 23 IU/L (ref 0–32)
AST: 24 IU/L (ref 0–40)
Albumin/Globulin Ratio: 2.3 — ABNORMAL HIGH (ref 1.2–2.2)
Albumin: 4.8 g/dL — ABNORMAL HIGH (ref 3.6–4.6)
Alkaline Phosphatase: 60 IU/L (ref 39–117)
BUN/Creatinine Ratio: 11 — ABNORMAL LOW (ref 12–28)
BUN: 16 mg/dL (ref 8–27)
Bilirubin Total: 0.5 mg/dL (ref 0.0–1.2)
CO2: 22 mmol/L (ref 20–29)
Calcium: 9.1 mg/dL (ref 8.7–10.3)
Chloride: 102 mmol/L (ref 96–106)
Creatinine, Ser: 1.49 mg/dL — ABNORMAL HIGH (ref 0.57–1.00)
GFR calc Af Amer: 37 mL/min/{1.73_m2} — ABNORMAL LOW (ref >59)
GFR calc non Af Amer: 32 mL/min/{1.73_m2} — ABNORMAL LOW (ref >59)
Globulin, Total: 2.1 g/dL (ref 1.5–4.5)
Glucose: 128 mg/dL — ABNORMAL HIGH (ref 65–99)
Potassium: 3.5 mmol/L (ref 3.5–5.2)
Sodium: 143 mmol/L (ref 134–144)
Total Protein: 6.9 g/dL (ref 6.0–8.5)

## 2019-09-14 NOTE — Progress Notes (Signed)
Called pt to inform her about her lab results. Pt understood

## 2019-09-28 ENCOUNTER — Other Ambulatory Visit: Payer: Self-pay

## 2019-09-28 ENCOUNTER — Ambulatory Visit: Payer: Medicare Other | Admitting: Cardiology

## 2019-09-28 ENCOUNTER — Encounter: Payer: Self-pay | Admitting: Cardiology

## 2019-09-28 VITALS — BP 130/80 | HR 70 | Ht 63.0 in | Wt 146.6 lb

## 2019-09-28 DIAGNOSIS — R0609 Other forms of dyspnea: Secondary | ICD-10-CM

## 2019-09-28 DIAGNOSIS — I2510 Atherosclerotic heart disease of native coronary artery without angina pectoris: Secondary | ICD-10-CM | POA: Diagnosis not present

## 2019-09-28 DIAGNOSIS — I4821 Permanent atrial fibrillation: Secondary | ICD-10-CM

## 2019-09-28 DIAGNOSIS — E7800 Pure hypercholesterolemia, unspecified: Secondary | ICD-10-CM

## 2019-09-28 DIAGNOSIS — G4734 Idiopathic sleep related nonobstructive alveolar hypoventilation: Secondary | ICD-10-CM | POA: Diagnosis not present

## 2019-09-28 MED ORDER — ATORVASTATIN CALCIUM 10 MG PO TABS: 10.0000 mg | ORAL_TABLET | Freq: Every day | ORAL | 3 refills | Status: DC

## 2019-09-28 NOTE — Progress Notes (Signed)
 Primary Physician/Referring:  Little, James, MD  Patient ID: [name], female    DOB: 09/30/1934, 84 y.o.   MRN: [id number]  Chief Complaint  Patient presents with  . Hyperlipidemia    Labs  . Atrial Fibrillation  . Chronic Kidney Disease  . Follow-up    8wk   HPI: [name]  is a 84 y.o. female Caucasian patient with rare form of low-grade lung carcinoid, permanent atrial fibrillation, moderate diffuse coronary artery disease, asymptomatic right subclavian artery stenosis, hypertension, type II diabetes mellitus, hyperlipidemia. She is statin intolerant, but due to marked elevation LDL, 3 months ago, she was started on Crestor 20 mg which she is tolerating.  She now presents here for follow-up.   Except for chronic dyspnea, states that she is doing well.  Due to uncontrolled diabetes mellitus, she was recently started on Tradjenta by her endocrinologist and also due to markedly abnormal TSH which I felt was probably related to amiodarone, her Synthroid dose was readjusted.  She has a follow-up visit with her soon.  Past Medical History:  Diagnosis Date  . Acute diverticulitis 06/2017  . Anemia 06/2017  . ARF (acute renal failure) (HCC) 06/2017  . CAD (coronary artery disease) 03/13/2012  . Diabetes mellitus   . Dysrhythmia    ATRIAL FIB  . Fall 07/07/2017  . HTN (hypertension) 03/13/2012  . Hypothyroid 03/13/2012  . Lung cancer (HCC)   . Shortness of breath   . Stroke (HCC)   . Syncope and collapse 04/02/2016  . Type II or unspecified type diabetes mellitus without mention of complication, not stated as uncontrolled 03/13/2012    Past Surgical History:  Procedure Laterality Date  . ABDOMINAL HYSTERECTOMY    . BREAST EXCISIONAL BIOPSY Left   . BREAST SURGERY     benign tumors removed in 1980  . CARDIAC CATHETERIZATION    . CARDIOVERSION  04/20/2012   Procedure: CARDIOVERSION;  Surgeon: Jagadeesh R Ganji, MD;  Location: [facility] OR;  Service: Cardiovascular;   Laterality: N/A;  . CARDIOVERSION N/A 05/10/2013   Procedure: CARDIOVERSION;  Surgeon: Jagadeesh R Ganji, MD;  Location: [facility] ENDOSCOPY;  Service: Cardiovascular;  Laterality: N/A;  . CHOLECYSTECTOMY    . HEMORRHOID SURGERY    . jaw replacement    . LEFT HEART CATHETERIZATION WITH CORONARY ANGIOGRAM N/A 04/20/2015   Procedure: LEFT HEART CATHETERIZATION WITH CORONARY ANGIOGRAM;  Surgeon: Jay Ganji, MD;  Location: [facility] CATH LAB;  Service: Cardiovascular;  Laterality: N/A;  . tumors     back of head    Social History   Socioeconomic History  . Marital status: Married    Spouse name: Not on file  . Number of children: 3  . Years of education: Not on file  . Highest education level: Not on file  Occupational History  . Not on file  Social Needs  . Financial resource strain: Not on file  . Food insecurity    Worry: Not on file    Inability: Not on file  . Transportation needs    Medical: Not on file    Non-medical: Not on file  Tobacco Use  . Smoking status: Never Smoker  . Smokeless tobacco: Never Used  Substance and Sexual Activity  . Alcohol use: No  . Drug use: No  . Sexual activity: Not on file  Lifestyle  . Physical activity    Days per week: Not on file    Minutes per session: Not on file  . Stress: Not on   file  Relationships  . Social connections    Talks on phone: Not on file    Gets together: Not on file    Attends religious service: Not on file    Active member of club or organization: Not on file    Attends meetings of clubs or organizations: Not on file    Relationship status: Not on file  . Intimate partner violence    Fear of current or ex partner: Not on file    Emotionally abused: Not on file    Physically abused: Not on file    Forced sexual activity: Not on file  Other Topics Concern  . Not on file  Social History Narrative  . Not on file   Review of Systems  Constitution: Positive for malaise/fatigue. Negative for chills, decreased appetite and  weight gain.  Cardiovascular: Positive for dyspnea on exertion. Negative for leg swelling and syncope.  Endocrine: Negative for cold intolerance.  Hematologic/Lymphatic: Does not bruise/bleed easily.  Musculoskeletal: Negative for joint swelling.  Gastrointestinal: Negative for abdominal pain, anorexia, change in bowel habit, hematochezia and melena.  Neurological: Positive for dizziness. Negative for headaches.  Psychiatric/Behavioral: Negative for depression and substance abuse.  All other systems reviewed and are negative.  Objective  Height 5' 3" (1.6 m), weight 146 lb 9.6 oz (66.5 kg). Body mass index is 25.97 kg/m.   Physical Exam  Constitutional: She appears well-nourished. No distress.  Moderately built  HENT:  Head: Atraumatic.  Eyes: Conjunctivae are normal.  Neck: Neck supple. No JVD present. No thyromegaly present.  Cardiovascular: An irregularly irregular rhythm present. Exam reveals distant heart sounds. Exam reveals no gallop, no S3 and no S4.  Murmur heard. High-pitched midsystolic murmur is present with a grade of 2/6 at the upper right sternal border. Pulses:      Carotid pulses are 2+ on the right side and 2+ on the left side.      Femoral pulses are 2+ on the right side and 2+ on the left side.      Dorsalis pedis pulses are 1+ on the right side and 1+ on the left side.       Posterior tibial pulses are 1+ on the right side and 1+ on the left side.  S1 is variable, S2 is normal. 1-2+ bilateral ankle edema. Varicose veins left > right noted with mild venostasis dermatitis, No JVD.   Pulmonary/Chest: Effort normal and breath sounds normal.  Abdominal: Soft. Bowel sounds are normal.  Musculoskeletal: Normal range of motion.        General: No edema.  Neurological: She is alert.  Skin: Skin is warm and dry.  Psychiatric: She has a normal mood and affect.   Radiology: No results found.  Laboratory examination:   CMP Latest Ref Rng & Units 09/12/2019 07/05/2019  06/20/2019  Glucose 65 - 99 mg/dL 128(H) 116(H) 114(H)  BUN 8 - 27 mg/dL 16 25 19  Creatinine 0.57 - 1.00 mg/dL 1.49(H) 1.53(H) 1.77(H)  Sodium 134 - 144 mmol/L 143 142 141  Potassium 3.5 - 5.2 mmol/L 3.5 4.8 4.1  Chloride 96 - 106 mmol/L 102 104 103  CO2 20 - 29 mmol/L 22 22 22  Calcium 8.7 - 10.3 mg/dL 9.1 - 8.8  Total Protein 6.0 - 8.5 g/dL 6.9 - 6.6  Total Bilirubin 0.0 - 1.2 mg/dL 0.5 - 0.5  Alkaline Phos 39 - 117 IU/L 60 - 66  AST 0 - 40 IU/L 24 - 21  ALT 0 -   32 IU/L 23 - 14   CBC Latest Ref Rng & Units 06/20/2019 08/16/2018 07/08/2017  WBC 3.4 - 10.8 x10E3/uL 5.4 4.7 4.4  Hemoglobin 11.1 - 15.9 g/dL 12.0 10.7(L) 10.6(L)  Hematocrit 34.0 - 46.6 % 36.1 32.9(L) 32.0(L)  Platelets 150 - 450 x10E3/uL 130(L) 139(L) 94(L)   Lipid Panel     Component Value Date/Time   CHOL 116 09/12/2019 0957   TRIG 109 09/12/2019 0957   HDL 50 09/12/2019 0957   CHOLHDL 2.3 09/12/2019 0957   CHOLHDL 3.8 12/16/2016 0312   VLDL 26 12/16/2016 0312   LDLCALC 46 09/12/2019 0957   LABVLDL 20 09/12/2019 0957     Labs 08/16/2018: Serum glucose 169 mg, BUN 9, creatinine 0.93, eGFR 56 mL, HB 10.7/HCT 32.9, platelets 139.  TSH markedly reduced at 0.012.  Total cholesterol 120, triglycerides 130, HDL 32, LDL 62.  HEMOGLOBIN A1C Lab Results  Component Value Date   HGBA1C 7.8 (H) 06/20/2019   MPG 183 12/13/2016   TSH Recent Labs    06/20/19 0935  TSH 15.720*   Allergies  Allergen Reactions  . Codeine Nausea And Vomiting  . Hydrocodone-Acetaminophen Nausea And Vomiting     Medications   Medications Discontinued During This Encounter  Medication Reason  . atorvastatin (LIPITOR) 10 MG tablet Duplicate   Current Meds  Medication Sig  . acetaminophen (TYLENOL) 500 MG tablet Take 500 mg by mouth every 6 (six) hours as needed for moderate pain or headache.   . amiodarone (PACERONE) 200 MG tablet Take 0.5 tablets (100 mg total) by mouth daily.  . b complex vitamins tablet Take 1 tablet by mouth  daily.  . Calcium Carb-Cholecalciferol (CALCIUM 600-D PO) Take 600 mg by mouth daily.  . Cholecalciferol (VITAMIN D3) 25 MCG (1000 UT) CHEW Chew by mouth daily.  . clobetasol cream (TEMOVATE) 0.05 % Apply 1 application topically 2 (two) times daily as needed (itching/ do not use on face).  . diltiazem (DILACOR XR) 180 MG 24 hr capsule Take 180 mg by mouth daily.  . glimepiride (AMARYL) 2 MG tablet Take 2 mg by mouth daily.  . hydrochlorothiazide (MICROZIDE) 12.5 MG capsule Take 12.5 mg by mouth daily.  . hydrocortisone cream 1 % Apply 1 application topically 2 (two) times daily.  . levocetirizine (XYZAL) 5 MG tablet Take 5 mg by mouth every evening.  . levothyroxine (SYNTHROID, LEVOTHROID) 100 MCG tablet Take 1 tablet (100 mcg total) by mouth daily before breakfast.  . meclizine (ANTIVERT) 25 MG tablet Take 25 mg by mouth 4 (four) times daily as needed for dizziness.  . mupirocin ointment (BACTROBAN) 2 % Apply 1 application topically daily as needed (itching).   . pramoxine (SARNA SENSITIVE) 1 % LOTN Apply 1 application topically 3 (three) times daily as needed (itching).  . Rivaroxaban (XARELTO) 15 MG TABS tablet Take 1 tablet (15 mg total) by mouth daily with supper.  . rosuvastatin (CRESTOR) 20 MG tablet Take 1 tablet (20 mg total) by mouth daily.  . TRADJENTA 5 MG TABS tablet Take 1 tablet by mouth daily after breakfast.    Cardiac Studies:   Echocardiogram 04/19/2015: Normal LV systolic function, moderately calcified aortic valve.  Mild aortic stenosis.  Aortic valve area 1.0 cm, peak gradient of 26 and mean gradient of 12 mmHg.  Moderate pulmonary hypertension, PA pressure estimated at 41 mmHg.  Coronary angiogram  06/24/2016 Mild pulmonary hypertension suggestive of primary pulmonary hypertension, mean PA pressure of 26 mmHg.  proximal LAD revealing a 50% to   60% stenosis and calcified, No significant change from 04/20/2015.  Mild aortic stenosis with a 12 mm pressure gradient across the  aortic valve.  Lexiscan myoview stress test 04/26/2018:  1. Lexiscan stress test performed. Exercise capacity not assessed. Stress symptoms included dizziness and chest pain/. Normal blood pressure. The resting electrocardiogram demonstrated atrial fibrillation with rapid ventricular rate, normal resting conduction and nonspecific ST-T changes.  Stress EKG is non diagnostic for ischemia as it is a pharmacologic stress.  2. The overall quality of the study is excellent. There is no evidence of abnormal lung activity. Stress and rest SPECT images demonstrate homogeneous tracer distribution throughout the myocardium. Gated SPECT imaging reveals normal myocardial thickening and wall motion. The left ventricular ejection fraction was normal (65%).   3. Low risk study.   Assessment     ICD-10-CM   1. Permanent atrial fibrillation (HCC)  I48.21 EKG 12-Lead   CHA2DS2-VASc Score is 6 with yearly risk of stroke of 9.8%.  2. Coronary artery disease involving native coronary artery of native heart without angina pectoris  I25.10 DISCONTINUED: atorvastatin (LIPITOR) 10 MG tablet  3. Dyspnea on exertion  R06.09   4. Nocturnal hypoxemia  G47.34   5. Hypercholesteremia  E78.00     EKG 09/28/2019: Atrial fibrillation with controlled ventricular response at the rate of 100 bpm, poor R-wave progression, cannot exclude anteroseptal infarct old.  Low-voltage complexes.  PVCs.  Normal QT interval. No significant change from  EKG 06/20/2019: Atrial fibrillation with controlled ventricular response at the rate of 89 bpm  Recommendations:   Patient is presently doing well, she has not had any recurrence of angina pectoris and dyspnea has remained stable and on physical exam aortic stenosis does not appear to be any severe.    She is now tolerating amiodarone with good control of atrial fibrillation heart rate.  Previously without amiodarone she had rapid ventricular response on the EKG.  However I am concerned about  her abnormal TSH related to amiodarone but this has been now addressed by her endocrinologist Dr. Madelin Rear.   Due to nocturnal hypoxemia, I placed her on oxygen therapy, which she has discontinued this stating that she cannot afford $18 a month.  She is worried about her medical expenses.  She also has underlying lung scoring and CAD, she would benefit from oxygen supplementation.  She is now tolerating Crestor 20 mg and lipids are now normalized, if she were to develop any arthralgia, we could decrease dose to 5-10 mg daily as she only has moderate diffuse CAD. She indeed has now developed stage 3 CKD due to DM.   Adrian Prows, MD, [facility] 09/28/2019, 10:22 PM [facility] Pager: [phone] Office: [phone] If no answer Cell [phone]  CC: Madelin Rear, MD (Endocrinology)

## 2019-12-07 ENCOUNTER — Other Ambulatory Visit: Payer: Self-pay

## 2019-12-07 DIAGNOSIS — I2510 Atherosclerotic heart disease of native coronary artery without angina pectoris: Secondary | ICD-10-CM

## 2019-12-07 MED ORDER — RIVAROXABAN 15 MG PO TABS: 15.0000 mg | ORAL_TABLET | Freq: Every day | ORAL | 1 refills | Status: DC

## 2019-12-15 ENCOUNTER — Other Ambulatory Visit: Payer: Self-pay

## 2019-12-15 DIAGNOSIS — I2510 Atherosclerotic heart disease of native coronary artery without angina pectoris: Secondary | ICD-10-CM

## 2019-12-15 MED ORDER — AMIODARONE HCL 200 MG PO TABS: 100.0000 mg | ORAL_TABLET | Freq: Every day | ORAL | 3 refills | Status: DC

## 2019-12-28 ENCOUNTER — Other Ambulatory Visit: Payer: Self-pay

## 2019-12-28 DIAGNOSIS — I4821 Permanent atrial fibrillation: Secondary | ICD-10-CM

## 2019-12-28 MED ORDER — DILTIAZEM HCL ER 180 MG PO CP24: 180.0000 mg | ORAL_CAPSULE | Freq: Every day | ORAL | 3 refills | Status: DC

## 2020-02-22 ENCOUNTER — Other Ambulatory Visit: Payer: Self-pay | Admitting: Cardiology

## 2020-02-22 DIAGNOSIS — I2510 Atherosclerotic heart disease of native coronary artery without angina pectoris: Secondary | ICD-10-CM

## 2020-03-06 ENCOUNTER — Ambulatory Visit (HOSPITAL_COMMUNITY)
Admission: EM | Admit: 2020-03-06 | Discharge: 2020-03-06 | Disposition: A | Discharge status: Home / Self Care | Payer: Medicare Other | Attending: Family Medicine | Admitting: Family Medicine

## 2020-03-06 ENCOUNTER — Encounter (HOSPITAL_COMMUNITY): Payer: Self-pay

## 2020-03-06 ENCOUNTER — Other Ambulatory Visit: Payer: Self-pay

## 2020-03-06 DIAGNOSIS — H9202 Otalgia, left ear: Secondary | ICD-10-CM | POA: Diagnosis not present

## 2020-03-06 MED ORDER — TRIAMCINOLONE ACETONIDE 0.1 % EX CREA: 1.0000 "application " | TOPICAL_CREAM | Freq: Two times a day (BID) | CUTANEOUS | 0 refills | Status: DC

## 2020-03-06 NOTE — ED Provider Notes (Signed)
[place]   [id number] 03/06/20 Arrival Time: 2297  ASSESSMENT & PLAN:  1. Pain of left ear structure     Meds ordered this encounter  Medications  . triamcinolone cream (KENALOG) 0.1 %    Sig: Apply 1 application topically 2 (two) times daily.    Dispense:  15 g    Refill:  0    Recommend: Follow-up Information    Schedule an appointment as soon as possible for a visit  with [facility].   Contact information: [address] [place] [address] [phone]          I see no signs of foreign body of ear or infection. Attempted to reassure but she does not seem very reassured. Will try trial of steroid cream.  Reviewed expectations re: course of current medical issues. Questions answered. Outlined signs and symptoms indicating need for more acute intervention. Patient verbalized understanding. After Visit Summary given.   SUBJECTIVE: History from: patient.  [name] is a 86 y.o. female who presents with complaint of "area on my ear that hurts". Chronic; > 1 year. Has seen PCP several times. "Putting salve on it seems to help a little". No active drainage. Reports removing tick from this area over a year ago and questions if part of tick could be under skin. Normal hearing. Afebrile.   Social History   Tobacco Use  Smoking Status Never Smoker  Smokeless Tobacco Never Used      OBJECTIVE:  Vitals:   03/06/20 1401 03/06/20 1404  BP:  (!) 143/78  Pulse:  (!) 103  Resp:  17  Temp:  98.2 F (36.8 C)  TempSrc:  Oral  SpO2:  100%  Weight: 61.7 kg     Slight tachycardia noted. Regular. Recheck 92.  General appearance: alert; NAD Ear Canal: normal bilaterally External ear: area under crus of helix on left ear with apparent dried and cracked skin; no drainage or bleeding Neck: supple without LAD Lungs: unlabored respirations, speaks full sentences without difficulty Skin: warm and  dry Psychological: alert and cooperative; normal mood and affect  Allergies  Allergen Reactions  . Codeine Nausea And Vomiting  . Hydrocodone-Acetaminophen Nausea And Vomiting    Past Medical History:  Diagnosis Date  . Acute diverticulitis 06/2017  . Anemia 06/2017  . ARF (acute renal failure) ([place]) 06/2017  . CAD (coronary artery disease) 03/13/2012  . Diabetes mellitus   . Dysrhythmia    ATRIAL FIB  . Fall 07/07/2017  . HTN (hypertension) 03/13/2012  . Hypothyroid 03/13/2012  . Lung cancer ([place])   . Shortness of breath   . Stroke ([place])   . Syncope and collapse 04/02/2016  . Type II or unspecified type diabetes mellitus without mention of complication, not stated as uncontrolled 03/13/2012   Family History  Problem Relation Age of Onset  . Breast cancer Mother   . Aneurysm Father        Likely thoracic aneurysm per patient  . Melanoma Brother   . Heart disease Sister   . Diabetes type II Sister    Social History   Socioeconomic History  . Marital status: Married    Spouse name: Not on file  . Number of children: 3  . Years of education: Not on file  . Highest education level: Not on file  Occupational History  . Not on file  Tobacco Use  . Smoking status: Never Smoker  . Smokeless tobacco: Never Used  Substance and Sexual Activity  . Alcohol use: No  . Drug use: No  . Sexual activity: Yes    Birth control/protection: None  Other Topics Concern  . Not on file  Social History Narrative  . Not on file   Social Determinants of Health   Financial Resource Strain:   . Difficulty of Paying Living Expenses: Not on file  Food Insecurity:   . Worried About Charity fundraiser in the Last Year: Not on file  . Ran Out of Food in the Last Year: Not on file  Transportation Needs:   . Lack of Transportation (Medical): Not on file  . Lack of Transportation (Non-Medical): Not on file  Physical Activity:   . Days of Exercise per Week: Not on file  . Minutes of  Exercise per Session: Not on file  Stress:   . Feeling of Stress : Not on file  Social Connections:   . Frequency of Communication with Friends and Family: Not on file  . Frequency of Social Gatherings with Friends and Family: Not on file  . Attends Religious Services: Not on file  . Active Member of Clubs or Organizations: Not on file  . Attends Archivist Meetings: Not on file  . Marital Status: Not on file  Intimate Partner Violence:   . Fear of Current or Ex-Partner: Not on file  . Emotionally Abused: Not on file  . Physically Abused: Not on file  . Sexually Abused: Not on file            Vanessa Kick, MD 03/06/20 1620

## 2020-03-06 NOTE — ED Triage Notes (Signed)
Pt is here with left ear pain that started a month ago, states she had a tick in her ear 2 years ago, think its still there.

## 2020-03-19 ENCOUNTER — Telehealth: Payer: Self-pay

## 2020-03-19 NOTE — Telephone Encounter (Signed)
LMOM for pt to r/s her 03/23/2020 f/u appt due to Dr. Einar Gip having a procedure at [place].

## 2020-03-22 ENCOUNTER — Ambulatory Visit: Payer: Medicare Other | Admitting: Cardiology

## 2020-03-22 NOTE — Progress Notes (Deleted)
Primary Physician/Referring:  Deanna Roers, MD  Patient ID: [name], female    DOB: March 26, 1934, 86 y.o.   MRN: [id number]  No chief complaint on file.  HPI: [name]  is a 86 y.o. female Caucasian patient with rare form of low-grade lung carcinoid, permanent atrial fibrillation, moderate diffuse coronary artery disease, asymptomatic right subclavian artery stenosis, hypertension, type II diabetes mellitus, hyperlipidemia. She is statin intolerant, but due to marked elevation LDL, 3 months ago, she was started on Crestor 20 mg which she is tolerating.  She now presents here for follow-up.   Except for chronic dyspnea, states that she is doing well.  Due to uncontrolled diabetes mellitus, she was recently started on Tradjenta by her endocrinologist and also due to markedly abnormal TSH which I felt was probably related to amiodarone, her Synthroid dose was readjusted.  She has a follow-up visit with her soon.  Past Medical History:  Diagnosis Date  . Acute diverticulitis 06/2017  . Anemia 06/2017  . ARF (acute renal failure) ([place]) 06/2017  . CAD (coronary artery disease) 03/13/2012  . Diabetes mellitus   . Dysrhythmia    ATRIAL FIB  . Fall 07/07/2017  . HTN (hypertension) 03/13/2012  . Hypothyroid 03/13/2012  . Lung cancer ([place])   . Shortness of breath   . Stroke ([place])   . Syncope and collapse 04/02/2016  . Type II or unspecified type diabetes mellitus without mention of complication, not stated as uncontrolled 03/13/2012    Past Surgical History:  Procedure Laterality Date  . ABDOMINAL HYSTERECTOMY    . BREAST EXCISIONAL BIOPSY Left   . BREAST SURGERY     benign tumors removed in 1980  . CARDIAC CATHETERIZATION    . CARDIOVERSION  04/20/2012   Procedure: CARDIOVERSION;  Surgeon: Laverda Page, MD;  Location: [place];  Service: Cardiovascular;  Laterality: N/A;  . CARDIOVERSION N/A 05/10/2013   Procedure: CARDIOVERSION;  Surgeon: Laverda Page, MD;  Location:  [place];  Service: Cardiovascular;  Laterality: N/A;  . CHOLECYSTECTOMY    . HEMORRHOID SURGERY    . jaw replacement    . LEFT HEART CATHETERIZATION WITH CORONARY ANGIOGRAM N/A 04/20/2015   Procedure: LEFT HEART CATHETERIZATION WITH CORONARY ANGIOGRAM;  Surgeon: Adrian Prows, MD;  Location: [facility] C[facility] LAB;  Service: Cardiovascular;  Laterality: N/A;  . tumors     back of head   Social History   Tobacco Use  . Smoking status: Never Smoker  . Smokeless tobacco: Never Used  Substance Use Topics  . Alcohol use: No    Review of Systems  Constitution: Positive for malaise/fatigue.  Cardiovascular: Positive for dyspnea on exertion (chronic). Negative for chest pain and leg swelling.  Gastrointestinal: Negative for melena.   Objective  There were no vitals taken for this visit. There is no height or weight on file to calculate BMI.   Vitals with BMI 03/06/2020 09/28/2019 08/02/2019  Height - _0  _1   Weight 136 lbs 146 lbs 10 oz 144 lbs  BMI - 52.77 82.42  Systolic 353 614 431  Diastolic 78 80 82  Pulse 540 70 -      Physical Exam  Constitutional: She appears well-nourished. No distress.  Moderately built  Cardiovascular: An irregularly irregular rhythm present. Exam reveals distant heart sounds. Exam reveals no gallop, no S3 and no S4.  Murmur heard. High-pitched midsystolic murmur is present with a grade of 2/6 at the upper right sternal border. Pulses:  Carotid pulses are 2+ on the right side and 2+ on the left side.      Femoral pulses are 2+ on the right side and 2+ on the left side.      Dorsalis pedis pulses are 1+ on the right side and 1+ on the left side.       Posterior tibial pulses are 1+ on the right side and 1+ on the left side.  S1 is variable, S2 is normal. 1-2+ bilateral ankle edema. Varicose veins left > right noted with mild venostasis dermatitis. No JVD.   Pulmonary/Chest: Effort normal and breath sounds normal.  Abdominal: Soft. Bowel sounds are normal.     Radiology: No results found.  Laboratory examination:   CMP Latest Ref Rng & Units 09/12/2019 07/05/2019 06/20/2019  Glucose 65 - 99 mg/dL 128(H) 116(H) 114(H)  BUN 8 - 27 mg/dL _0 Creatinine 0.57 - 1.00 mg/dL 1.49(H) 1.53(H) 1.77(H)  Sodium 134 - 144 mmol/L 143 142 141  Potassium 3.5 - 5.2 mmol/L 3.5 4.8 4.1  Chloride 96 - 106 mmol/L 102 104 103  CO2 20 - 29 mmol/L _1 Calcium 8.7 - 10.3 mg/dL 9.1 - 8.8  Total Protein 6.0 - 8.5 g/dL 6.9 - 6.6  Total Bilirubin 0.0 - 1.2 mg/dL 0.5 - 0.5  Alkaline Phos 39 - 117 IU/L 60 - 66  AST 0 - 40 IU/L 24 - 21  ALT 0 - 32 IU/L 23 - 14   CBC Latest Ref Rng & Units 06/20/2019 08/16/2018 07/08/2017  WBC 3.4 - 10.8 x10E3/uL 5.4 4.7 4.4  Hemoglobin 11.1 - 15.9 g/dL 12.0 10.7(L) 10.6(L)  Hematocrit 34.0 - 46.6 % 36.1 32.9(L) 32.0(L)  Platelets 150 - 450 x10E3/uL 130(L) 139(L) 94(L)    External labs:  Cholesterol, total 116.000 09/12/2019 HDL 50.000 09/12/2019 LDL  45 Triglycerides 109.000 09/12/2019  A1C 8.800 % 11/16/2019; TSH 6.340 11/16/2019  Hemoglobin 12.000 G/ 06/20/2019; Platelets 130.000 06/20/2019  Creatinine, Serum 1.420 MG/ 02/08/2020 Potassium 3.500 09/12/2019 ALT (SGPT) 18.000 IU/ 02/08/2020  Labs 08/16/2018: Serum glucose 169 mg, BUN 9, creatinine 0.93, eGFR 56 mL, HB 10.7/HCT 32.9, platelets 139.  TSH markedly reduced at 0.012.  Total cholesterol 120, triglycerides 130, HDL 32, LDL 62.  Allergies  Allergen Reactions  . Codeine Nausea And Vomiting  . Hydrocodone-Acetaminophen Nausea And Vomiting     Medications   There are no discontinued medications. No outpatient medications have been marked as taking for the 03/22/20 encounter (Appointment) with Adrian Prows, MD.    Cardiac Studies:   Echocardiogram 04/19/2015: Normal LV systolic function, moderately calcified aortic valve.  Mild aortic stenosis.  Aortic valve area 1.0 cm, peak gradient of 26 and mean gradient of 12 mmHg.  Moderate pulmonary hypertension, PA  pressure estimated at 41 mmHg.  Coronary angiogram  06/24/2016 Mild pulmonary hypertension suggestive of primary pulmonary hypertension, mean PA pressure of 26 mmHg.  proximal LAD revealing a 50% to 60% stenosis and calcified, No significant change from 04/20/2015.  Mild aortic stenosis with a 12 mm pressure gradient across the aortic valve.  Lexiscan myoview stress test 04/26/2018:  1. Lexiscan stress test performed. Exercise capacity not assessed. Stress symptoms included dizziness and chest pain/. Normal blood pressure. The resting electrocardiogram demonstrated atrial fibrillation with rapid ventricular rate, normal resting conduction and nonspecific ST-T changes.  Stress EKG is non diagnostic for ischemia as it is a pharmacologic stress.  2. The overall quality of the study is excellent. There is no evidence  of abnormal lung activity. Stress and rest SPECT images demonstrate homogeneous tracer distribution throughout the myocardium. Gated SPECT imaging reveals normal myocardial thickening and wall motion. The left ventricular ejection fraction was normal (65%).   3. Low risk study.   Assessment     ICD-10-CM   1. Permanent atrial fibrillation ([place]). CHA2DS2-VASc Score is 6 (A, F, HTN, Vasc, DM). Yearly risk of stroke of 9.8%.  I48.21   2. Coronary artery disease involving native coronary artery of native heart without angina pectoris  I25.10   3. Dyspnea on exertion  R06.00   4. Nocturnal hypoxemia  G47.34   5. Essential hypertension  I10   6. Mild aortic stenosis  I35.0     EKG 09/28/2019: Atrial fibrillation with controlled ventricular response at the rate of 100 bpm, poor R-wave progression, cannot exclude anteroseptal infarct old.  Low-voltage complexes.  PVCs.  Normal QT interval. No significant change from  EKG 06/20/2019: Atrial fibrillation with controlled ventricular response at the rate of 89 bpm  Recommendations:   KADISHA GOODINE  is a 86 y.o. female Caucasian patient with rare  form of low-grade lung carcinoid, permanent atrial fibrillation, moderate diffuse coronary artery disease, asymptomatic right subclavian artery stenosis, hypertension, type II diabetes mellitus, hyperlipidemia. She is statin intolerant, but tolerating Crestor 20 mg, her lipids are under excellent control   Except for chronic dyspnea, states that she is doing well, no clinical evidence of heart failure.    Diabetes continues to be uncontrolled, TSH is mildly abnormal but improved from previous, probably amiodarone contributing to her abnormal TSH.  This is being managed by her endocrinologist.  Overall atrial fibrillation remains rate controlled on present medical therapy.  Without amiodarone her heart rate control was extremely difficult.  CBC and serum creatinine is remained stable, she does have underlying diabetes mellitus associated with stage III chronic kidney disease.  No changes in the medications were done today.  I will see her back on an annual basis.  With regard to nocturnal hypoxemia, I prefer her to be on oxygen therapy at night especially in view of underlying lung disease, however patient is concerned about the expense associated with this.  With regard to aortic stenosis, heart murmur appears to be stable I do not suspect progression.  Adrian Prows, MD, [facility] 03/22/2020, 7:02 AM [facility] [place] Office: [phone]  CC: Madelin Rear, MD (Endocrinology)

## 2020-03-23 ENCOUNTER — Encounter: Payer: Self-pay | Admitting: Cardiology

## 2020-03-23 ENCOUNTER — Ambulatory Visit: Payer: Medicare Other | Admitting: Cardiology

## 2020-03-23 ENCOUNTER — Other Ambulatory Visit: Payer: Self-pay

## 2020-03-23 ENCOUNTER — Telehealth: Payer: Self-pay

## 2020-03-23 VITALS — BP 134/80 | HR 82 | Temp 98.0°F | Resp 16 | Ht 63.0 in | Wt 137.0 lb

## 2020-03-23 DIAGNOSIS — I350 Nonrheumatic aortic (valve) stenosis: Secondary | ICD-10-CM

## 2020-03-23 DIAGNOSIS — I2510 Atherosclerotic heart disease of native coronary artery without angina pectoris: Secondary | ICD-10-CM

## 2020-03-23 DIAGNOSIS — G4734 Idiopathic sleep related nonobstructive alveolar hypoventilation: Secondary | ICD-10-CM

## 2020-03-23 DIAGNOSIS — E7800 Pure hypercholesterolemia, unspecified: Secondary | ICD-10-CM

## 2020-03-23 DIAGNOSIS — R0609 Other forms of dyspnea: Secondary | ICD-10-CM

## 2020-03-23 DIAGNOSIS — I4821 Permanent atrial fibrillation: Secondary | ICD-10-CM

## 2020-03-23 NOTE — Progress Notes (Addendum)
Primary Physician/Referring:  Tamsen Roers, MD  Patient ID: [name], female    DOB: 07-13-1934, 86 y.o.   MRN: [id number]  Chief Complaint  Patient presents with  . Atrial Fibrillation  . Follow-up    6 month   HPI: [name]  is a 86 y.o. female Caucasian patient with rare form of low-grade lung carcinoid, permanent atrial fibrillation, moderate diffuse coronary artery disease, asymptomatic right subclavian artery stenosis, hypertension, type II diabetes mellitus, hyperlipidemia. She is statin intolerant, but due to marked elevation LDL, 3 months ago, she was started on Crestor 20 mg which she is tolerating.  She now presents here for follow-up.   Except for chronic dyspnea, states that she is doing well. Has been having problems with back pain and also feet pain.   Due to markedly abnormal TSH which I felt was probably related to amiodarone, her Synthroid dose was readjusted.    Past Medical History:  Diagnosis Date  . Acute diverticulitis 06/2017  . Anemia 06/2017  . ARF (acute renal failure) ([place]) 06/2017  . CAD (coronary artery disease) 03/13/2012  . Diabetes mellitus   . Dysrhythmia    ATRIAL FIB  . Fall 07/07/2017  . HTN (hypertension) 03/13/2012  . Hypothyroid 03/13/2012  . Lung cancer ([place])   . Shortness of breath   . Stroke ([place])   . Syncope and collapse 04/02/2016  . Type II or unspecified type diabetes mellitus without mention of complication, not stated as uncontrolled 03/13/2012    Past Surgical History:  Procedure Laterality Date  . ABDOMINAL HYSTERECTOMY    . BREAST EXCISIONAL BIOPSY Left   . BREAST SURGERY     benign tumors removed in 1980  . CARDIAC CATHETERIZATION    . CARDIOVERSION  04/20/2012   Procedure: CARDIOVERSION;  Surgeon: Laverda Page, MD;  Location: [place];  Service: Cardiovascular;  Laterality: N/A;  . CARDIOVERSION N/A 05/10/2013   Procedure: CARDIOVERSION;  Surgeon: Laverda Page, MD;  Location: [place];  Service:  Cardiovascular;  Laterality: N/A;  . CHOLECYSTECTOMY    . HEMORRHOID SURGERY    . jaw replacement    . LEFT HEART CATHETERIZATION WITH CORONARY ANGIOGRAM N/A 04/20/2015   Procedure: LEFT HEART CATHETERIZATION WITH CORONARY ANGIOGRAM;  Surgeon: Adrian Prows, MD;  Location: [facility] CATH LAB;  Service: Cardiovascular;  Laterality: N/A;  . tumors     back of head   Social History   Tobacco Use  . Smoking status: Never Smoker  . Smokeless tobacco: Never Used  Substance Use Topics  . Alcohol use: No    Review of Systems  Constitution: Positive for malaise/fatigue.  Cardiovascular: Positive for dyspnea on exertion (chronic). Negative for chest pain and leg swelling.  Musculoskeletal: Positive for back pain.  Gastrointestinal: Negative for melena.   Objective  Blood pressure 134/80, pulse 82, temperature 98 F (36.7 C), temperature source Temporal, resp. rate 16, height '5\' 3"'  (1.6 m), weight 137 lb (62.1 kg), SpO2 95 %. Body mass index is 24.27 kg/m.   Vitals with BMI 03/23/2020 03/06/2020 09/28/2019  Height '5\' 3"'  - '5\' 3"'   Weight 137 lbs 136 lbs 146 lbs 10 oz  BMI 74.08 - 14.48  Systolic 185 631 497  Diastolic 80 78 80  Pulse 82 103 70      Physical Exam  Constitutional: She appears well-nourished. No distress.  Moderately built  Cardiovascular: An irregularly irregular rhythm present. Exam reveals distant heart sounds. Exam reveals no gallop, no S3 and no  S4.  Murmur heard. High-pitched midsystolic murmur is present with a grade of 2/6 at the upper right sternal border. Pulses:      Carotid pulses are 2+ on the right side and 2+ on the left side.      Femoral pulses are 2+ on the right side and 2+ on the left side.      Popliteal pulses are 2+ on the right side and 2+ on the left side.       Dorsalis pedis pulses are 1+ on the right side and 1+ on the left side.       Posterior tibial pulses are 1+ on the right side and 1+ on the left side.  S1 is variable, S2 is normal. No edema.  Superficial varicose veins left > right noted with mild venostasis dermatitis. No JVD.   Pulmonary/Chest: Effort normal and breath sounds normal.  Abdominal: Soft. Bowel sounds are normal.   Radiology: No results found.  Laboratory examination:   CMP Latest Ref Rng & Units 09/12/2019 07/05/2019 06/20/2019  Glucose 65 - 99 mg/dL 128(H) 116(H) 114(H)  BUN 8 - 27 mg/dL '16 25 19  ' Creatinine 0.57 - 1.00 mg/dL 1.49(H) 1.53(H) 1.77(H)  Sodium 134 - 144 mmol/L 143 142 141  Potassium 3.5 - 5.2 mmol/L 3.5 4.8 4.1  Chloride 96 - 106 mmol/L 102 104 103  CO2 20 - 29 mmol/L '22 22 22  ' Calcium 8.7 - 10.3 mg/dL 9.1 - 8.8  Total Protein 6.0 - 8.5 g/dL 6.9 - 6.6  Total Bilirubin 0.0 - 1.2 mg/dL 0.5 - 0.5  Alkaline Phos 39 - 117 IU/L 60 - 66  AST 0 - 40 IU/L 24 - 21  ALT 0 - 32 IU/L 23 - 14   CBC Latest Ref Rng & Units 06/20/2019 08/16/2018 07/08/2017  WBC 3.4 - 10.8 x10E3/uL 5.4 4.7 4.4  Hemoglobin 11.1 - 15.9 g/dL 12.0 10.7(L) 10.6(L)  Hematocrit 34.0 - 46.6 % 36.1 32.9(L) 32.0(L)  Platelets 150 - 450 x10E3/uL 130(L) 139(L) 94(L)    External labs:  Cholesterol, total 116.000 09/12/2019 HDL 50.000 09/12/2019 LDL  45 Triglycerides 109.000 09/12/2019  A1C 8.800 % 11/16/2019; TSH 6.340 11/16/2019  Hemoglobin 12.000 G/ 06/20/2019; Platelets 130.000 06/20/2019  Creatinine, Serum 1.420 MG/ 02/08/2020 Potassium 3.500 09/12/2019 ALT (SGPT) 18.000 IU/ 02/08/2020  Labs 08/16/2018: Serum glucose 169 mg, BUN 9, creatinine 0.93, eGFR 56 mL, HB 10.7/HCT 32.9, platelets 139.  TSH markedly reduced at 0.012.  Total cholesterol 120, triglycerides 130, HDL 32, LDL 62.  Allergies  Allergen Reactions  . Codeine Nausea And Vomiting  . Hydrocodone-Acetaminophen Nausea And Vomiting     Medications   Medications Discontinued During This Encounter  Medication Reason  . glimepiride (AMARYL) 2 MG tablet Patient has not taken in last 30 days  . TRADJENTA 5 MG TABS tablet Patient has not taken in last 30 days  .  traMADol (ULTRAM) 50 MG tablet Patient has not taken in last 30 days   Current Meds  Medication Sig  . amiodarone (PACERONE) 200 MG tablet Take 0.5 tablets (100 mg total) by mouth daily.  Marland Kitchen b complex vitamins tablet Take 1 tablet by mouth daily.  . Calcium Carb-Cholecalciferol (CALCIUM 600-D PO) Take 600 mg by mouth daily.  . Cholecalciferol (VITAMIN D3) 25 MCG (1000 UT) CHEW Chew by mouth daily.  . clobetasol cream (TEMOVATE) 6.80 % Apply 1 application topically 2 (two) times daily as needed (itching/ do not use on face).  Marland Kitchen diltiazem (DILACOR XR) 180 MG  24 hr capsule Take 1 capsule (180 mg total) by mouth daily.  . hydrochlorothiazide (MICROZIDE) 12.5 MG capsule Take 12.5 mg by mouth daily.  . hydrocortisone cream 1 % Apply 1 application topically 2 (two) times daily.  Marland Kitchen LANTUS SOLOSTAR 100 UNIT/ML Solostar Pen   . levocetirizine (XYZAL) 5 MG tablet Take 5 mg by mouth every evening.  Marland Kitchen levothyroxine (SYNTHROID, LEVOTHROID) 100 MCG tablet Take 1 tablet (100 mcg total) by mouth daily before breakfast.  . meclizine (ANTIVERT) 25 MG tablet Take 25 mg by mouth 4 (four) times daily as needed for dizziness.  . mupirocin ointment (BACTROBAN) 2 % Apply 1 application topically daily as needed (itching).   [name] ULTRA test strip   . permethrin (ELIMITE) 5 % cream APPLY EXTERNALLY TO THE AFFECTED AREA 1 TIME. LEAVE ON OVER NIGHT THEN RINSE OFF THE FOLLOWING DAY. MAY REPEAT IN 2 WEEKS  . pramoxine (SARNA SENSITIVE) 1 % LOTN Apply 1 application topically 3 (three) times daily as needed (itching).  . rosuvastatin (CRESTOR) 20 MG tablet Take 1 tablet (20 mg total) by mouth daily.  Marland Kitchen triamcinolone cream (KENALOG) 0.1 % Apply 1 application topically 2 (two) times daily.  Alveda Reasons 15 MG TABS tablet TAKE 1 TABLET BY MOUTH DAILY WITH SUPPER    Cardiac Studies:   Coronary angiogram  06/24/2016 Mild pulmonary hypertension suggestive of primary pulmonary hypertension, mean PA pressure of 26 mmHg.  proximal  LAD revealing a 50% to 60% stenosis and calcified, No significant change from 04/20/2015.  Mild aortic stenosis with a 12 mm pressure gradient across the aortic valve.  Echocardiogram 07/09/2017:   - Left ventricle: The cavity size was normal. Mildly increased  relative wall thickness. There was hypertrophy of the posterior  wall. Systolic function was normal. The estimated ejection  fraction was in the range of 60% to 65%. Doppler parameters are  consistent with abnormal left ventricular relaxation (grade 1  diastolic dysfunction).  - Aortic valve: Transvalvular velocity was increased, due to  stenosis. There was moderate stenosis. There was mild  regurgitation. Valve area (VTI): 0.89 cm^2. Valve area (Vmax):  0.84 cm^2. Valve area (Vmean): 0.8 cm^2.  - Aorta: The aorta was moderately calcified and moderately  diseased.  - Mitral valve: Calcified annulus. Mildly thickened leaflets .  Mobility of the posterior leaflet was severely restricted. The  findings are consistent with mild stenosis. There was moderate  regurgitation. Valve area by continuity equation (using LVOT  flow): 1.59 cm^2.  - Left atrium: The atrium was moderately dilated.  - Right atrium: The atrium was mildly dilated.  - Tricuspid valve: There was moderate regurgitation.  - Pulmonary arteries: Systolic pressure was mildly to moderately  increased. PA peak pressure: 46 mm Hg (S).  Lexiscan myoview stress test 04/26/2018:  1. Lexiscan stress test performed. Exercise capacity not assessed. Stress symptoms included dizziness and chest pain/. Normal blood pressure. The resting electrocardiogram demonstrated atrial fibrillation with rapid ventricular rate, normal resting conduction and nonspecific ST-T changes.  Stress EKG is non diagnostic for ischemia as it is a pharmacologic stress.  2. The overall quality of the study is excellent. There is no evidence of abnormal lung activity. Stress and rest  SPECT images demonstrate homogeneous tracer distribution throughout the myocardium. Gated SPECT imaging reveals normal myocardial thickening and wall motion. The left ventricular ejection fraction was normal (65%).   3. Low risk study.   EKG:  EKG 03/23/2020: Atrial fibrillation with rapid ventricular response at the rate of 111  bpm, normal axis, poor R wave progression.  Low-voltage complexes, pulmonary disease pattern.  Nonspecific ST-T abnormality. No significant change from EKG 09/28/2019: Atrial fibrillation with controlled ventricular response at the rate of 100 bpm   Assessment     ICD-10-CM   1. Permanent atrial fibrillation ([facility])  I48.21 EKG 12-Lead    PCV ECHOCARDIOGRAM COMPLETE    Pulse oximetry, overnight  2. Coronary artery disease involving native coronary artery of native heart without angina pectoris  I25.10   3. Nonrheumatic aortic valve stenosis  I35.0 PCV ECHOCARDIOGRAM COMPLETE  4. Dyspnea on exertion  R06.00 Pulse oximetry, overnight  5. Hypercholesteremia  E78.00   6. Nocturnal hypoxemia  G47.34 Pulse oximetry, overnight    Recommendations:   JAMIAYA BINA  is a 86 y.o. female Caucasian patient with rare form of low-grade lung carcinoid, permanent atrial fibrillation, moderate diffuse coronary artery disease, asymptomatic right subclavian artery stenosis, hypertension, type II diabetes mellitus, hyperlipidemia. She is statin intolerant, but tolerating Crestor 20 mg, her lipids are under excellent control   Except for chronic dyspnea, states that she is doing well, no clinical evidence of heart failure.    Diabetes continues to be uncontrolled, TSH is mildly abnormal but improved from previous, probably amiodarone contributing to her abnormal TSH.  This is being managed by her endocrinologist.  Overall atrial fibrillation remains rate controlled on present medical therapy.  Without amiodarone her heart rate control was extremely difficult.  CBC and serum creatinine is  remained stable, she does have underlying diabetes mellitus associated with stage III chronic kidney disease.  No changes in the medications were done today.  I will see her back on an annual basis.  With regard to nocturnal hypoxemia, I prefer her to be on oxygen therapy at night especially in view of underlying lung disease, however patient is concerned about the expense associated with this ($20/month).  With regard to aortic stenosis, heart murmur appears to be stable I do not suspect progression, however it has been greater than 2 years since last echocardiogram, in view of dyspnea and A. fib, will obtain an repeat echocardiogram.  Unless abnormal I will see her back in 6 months.   I will find out if her insurance would cover for her oxygen, she greatly benefit from being on home oxygen in view of underlying lung disease and also cardiac conditions including chronic diastolic heart failure, atrial fibrillation and aortic stenosis. I will repeat oxymetry and see if the cost can be reduced.  Adrian Prows, MD, [facility] 04/12/2020, 5:04 PM [facility] [place] Office: [phone]  CC: Madelin Rear, MD (Endocrinology)

## 2020-03-28 ENCOUNTER — Ambulatory Visit: Payer: Medicare Other

## 2020-03-28 ENCOUNTER — Other Ambulatory Visit: Payer: Self-pay

## 2020-03-28 DIAGNOSIS — I350 Nonrheumatic aortic (valve) stenosis: Secondary | ICD-10-CM

## 2020-03-28 DIAGNOSIS — I4821 Permanent atrial fibrillation: Secondary | ICD-10-CM

## 2020-04-04 ENCOUNTER — Telehealth: Payer: Self-pay

## 2020-04-04 NOTE — Telephone Encounter (Signed)
Discussed results with patient she verbalized understanding.

## 2020-04-12 NOTE — Addendum Note (Signed)
Addended by: Kela Millin on: 04/12/2020 05:04 PM   Modules accepted: Orders

## 2020-04-23 ENCOUNTER — Ambulatory Visit: Payer: Medicare Other | Admitting: Cardiology

## 2020-08-14 ENCOUNTER — Other Ambulatory Visit: Payer: Self-pay | Admitting: Cardiology

## 2020-08-14 DIAGNOSIS — I2510 Atherosclerotic heart disease of native coronary artery without angina pectoris: Secondary | ICD-10-CM

## 2020-08-14 NOTE — Telephone Encounter (Signed)
Medication refill

## 2020-08-23 ENCOUNTER — Other Ambulatory Visit: Payer: Self-pay | Admitting: Family Medicine

## 2020-08-23 ENCOUNTER — Other Ambulatory Visit: Payer: Self-pay

## 2020-08-23 ENCOUNTER — Ambulatory Visit
Admission: RE | Admit: 2020-08-23 | Discharge: 2020-08-23 | Disposition: A | Discharge status: Home / Self Care | Payer: Medicare Other | Source: Ambulatory Visit | Attending: Family Medicine | Admitting: Family Medicine

## 2020-08-23 DIAGNOSIS — Z1231 Encounter for screening mammogram for malignant neoplasm of breast: Secondary | ICD-10-CM

## 2020-09-24 ENCOUNTER — Ambulatory Visit: Payer: Medicare Other | Admitting: Cardiology

## 2020-09-24 ENCOUNTER — Other Ambulatory Visit: Payer: Self-pay

## 2020-09-24 ENCOUNTER — Encounter: Payer: Self-pay | Admitting: Cardiology

## 2020-09-24 VITALS — BP 132/87 | HR 101 | Resp 16 | Ht 63.0 in | Wt 131.8 lb

## 2020-09-24 DIAGNOSIS — I350 Nonrheumatic aortic (valve) stenosis: Secondary | ICD-10-CM

## 2020-09-24 DIAGNOSIS — I4821 Permanent atrial fibrillation: Secondary | ICD-10-CM

## 2020-09-24 DIAGNOSIS — G4734 Idiopathic sleep related nonobstructive alveolar hypoventilation: Secondary | ICD-10-CM

## 2020-09-24 DIAGNOSIS — I2510 Atherosclerotic heart disease of native coronary artery without angina pectoris: Secondary | ICD-10-CM

## 2020-09-24 DIAGNOSIS — R0609 Other forms of dyspnea: Secondary | ICD-10-CM

## 2020-09-24 NOTE — Progress Notes (Signed)
Primary Physician/Referring:  Tamsen Roers, MD  Patient ID: [name], female    DOB: 06-28-1934, 86 y.o.   MRN: [id number]  Chief Complaint  Patient presents with  . Atrial Fibrillation  . Aortic Stenosis  . Follow-up    6 month   HPI: [name]  is a 86 y.o. female Caucasian patient with rare form of low-grade lung carcinoid, permanent atrial fibrillation, moderate diffuse coronary artery disease, asymptomatic right subclavian artery stenosis, hypertension, type II diabetes mellitus, hyperlipidemia. She is statin intolerant, but due to marked elevation LDL, she was started on Crestor 20 mg which she is tolerating.  She now presents here for 25-monthfollow-up.   She has chronic fatigue and also chronic dyspnea on exertion but denies PND or orthopnea or leg edema.  No chest pain or palpitations.  Past Medical History:  Diagnosis Date  . Acute diverticulitis 06/2017  . Anemia 06/2017  . [place] [place] [place]  . CAD (coronary artery disease) 03/13/2012  . Diabetes mellitus   . Dysrhythmia    ATRIAL FIB  . Fall 07/07/2017  . HTN (hypertension) 03/13/2012  . Hypothyroid 03/13/2012  . Lung cancer ([facility]   . Shortness of breath   . [place]roke ([facility]   . Syncope and collapse 04/02/2016  . Type II or unspecified type diabetes mellitus without mention of complication, not stated as uncontrolled 03/13/2012    Past Surgical History:  Procedure Laterality Date  . ABDOMINAL HYSTERECTOMY    . BREAST EXCISIONAL BIOPSY Left   . BREAST SURGERY     benign tumors removed in 1980  . CARDIAC CATHETERIZATION    . CARDIOVERSION  04/20/2012   Procedure: CARDIOVERSION;  Surgeon: JLaverda Page MD;  [facility]  Service: Cardiovascular;  Laterality: N/A;  . CARDIOVERSION N/A 05/10/2013   Procedure: CARDIOVERSION;  Surgeon: JLaverda Page MD;  [facility] [facility]  Service: Cardiovascular;  Laterality: N/A;  . CHOLECYSTECTOMY    . HEMORRHOID SURGERY    . jaw  replacement    . LEFT HEART CATHETERIZATION WITH CORONARY ANGIOGRAM N/A 04/20/2015   Procedure: LEFT HEART CATHETERIZATION WITH CORONARY ANGIOGRAM;  Surgeon: JAdrian Prows MD;  [facility];  Service: Cardiovascular;  Laterality: N/A;  . tumors     back of head   Social History   Tobacco Use  . Smoking status: Never Smoker  . Smokeless tobacco: Never Used  Substance Use Topics  . Alcohol use: No    Review of Systems  Constitutional: Positive for malaise/fatigue.  Cardiovascular: Positive for dyspnea on exertion (chronic). Negative for chest pain and leg swelling.  Musculoskeletal: Positive for back pain.  Gastrointestinal: Negative for melena.   Objective  Blood pressure 132/87, pulse (!) 101, resp. rate 16, height _0  (1.6 m), weight 131 lb 12.8 oz (59.8 kg), SpO2 99 %. Body mass index is 23.35 kg/m.   Vitals with BMI 09/24/2020 03/23/2020 03/06/2020  Height _1  _2  -  Weight 131 lbs 13 oz 137 lbs 136 lbs  BMI 277.41228.78-  Systolic [id number] Diastolic 87 80 78  Pulse [id number] 103      Physical Exam Constitutional:      General: She is not in acute distress.    Comments: Moderately built  Cardiovascular:     Rate and Rhythm: Rhythm irregular.     Pulses:          Carotid pulses are 2+ on the right side  and 2+ on the left side with bruit.      Femoral pulses are 2+ on the right side and 2+ on the left side.      Popliteal pulses are 0 on the right side and 2+ on the left side.       Dorsalis pedis pulses are 1+ on the right side and 2+ on the left side.       Posterior tibial pulses are 0 on the right side and 0 on the left side.     Heart sounds: Heart sounds are distant. Murmur heard. High-pitched midsystolic murmur is present with a grade of 2/6 at the upper right sternal border.  No gallop. No S3 or S4 sounds.      Comments: S1 is variable, S2 is normal. No edema. No JVD.  Left leg traumatic superficial cellulitis like change noted.  Pulmonary:      Effort: Pulmonary effort is normal.     Breath sounds: Normal breath sounds.  Abdominal:     General: Bowel sounds are normal.     Palpations: Abdomen is soft.    Radiology: No results found.  Laboratory examination:   CMP Latest Ref Rng & Units 09/12/2019 07/05/2019 06/20/2019  Glucose 65 - 99 mg/dL 128(H) 116(H) 114(H)  BUN 8 - 27 mg/dL _0 Creatinine 0.57 - 1.00 mg/dL 1.49(H) 1.53(H) 1.77(H)  Sodium 134 - 144 mmol/L 143 142 141  Potassium 3.5 - 5.2 mmol/L 3.5 4.8 4.1  Chloride 96 - 106 mmol/L 102 104 103  CO2 20 - 29 mmol/L _1 Calcium 8.7 - 10.3 mg/dL 9.1 - 8.8  Total Protein 6.0 - 8.5 g/dL 6.9 - 6.6  Total Bilirubin 0.0 - 1.2 mg/dL 0.5 - 0.5  Alkaline Phos 39 - 117 IU/L 60 - 66  AST 0 - 40 IU/L 24 - 21  ALT 0 - 32 IU/L 23 - 14   CBC Latest Ref Rng & Units 06/20/2019 08/16/2018 07/08/2017  WBC 3.4 - 10.8 x10E3/uL 5.4 4.7 4.4  Hemoglobin 11.1 - 15.9 g/dL 12.0 10.7(L) 10.6(L)  Hematocrit 34.0 - 46.6 % 36.1 32.9(L) 32.0(L)  Platelets 150 - 450 x10E3/uL 130(L) 139(L) 94(L)    Lipid Panel     Component Value Date/Time   CHOL 116 09/12/2019 0957   TRIG 109 09/12/2019 0957   HDL 50 09/12/2019 0957   CHOLHDL 2.3 09/12/2019 0957   CHOLHDL 3.8 12/16/2016 0312   VLDL 26 12/16/2016 0312   LDLCALC 46 09/12/2019 0957   LABVLDL 20 09/12/2019 0957    External labs:  A1C 13.000 % 02/08/2020 TSH 2.420 02/08/2020  Creatinine, Serum 1.420 MG/ 02/08/2020 ALT (SGPT) 18.000 IU/ 02/08/2020  Cholesterol, total 116.000 09/12/2019 HDL 50.000 09/12/2019 LDL  45 Triglycerides 109.000 09/12/2019  A1C 8.800 % 11/16/2019; TSH 6.340 11/16/2019  Hemoglobin 12.000 G/ 06/20/2019; Platelets 130.000 06/20/2019  Creatinine, Serum 1.420 MG/ 02/08/2020 Potassium 3.500 09/12/2019 ALT (SGPT) 18.000 IU/ 02/08/2020  Labs 08/16/2018: Serum glucose 169 mg, BUN 9, creatinine 0.93, eGFR 56 mL, HB 10.7/HCT 32.9, platelets 139.  TSH markedly reduced at 0.012.  Total cholesterol 120, triglycerides 130,  HDL 32, LDL 62.  Allergies  Allergen Reactions  . Codeine Nausea And Vomiting  . Hydrocodone-Acetaminophen Nausea And Vomiting     Medications   Medications Discontinued During This Encounter  Medication Reason  . pramoxine (SARNA SENSITIVE) 1 % LOTN Patient Preference   Current Meds  Medication Sig  . acetaminophen (TYLENOL) 500 MG tablet Take 500 mg  by mouth every 6 (six) hours as needed for moderate pain or headache.   Marland Kitchen amiodarone (PACERONE) 200 MG tablet TAKE 1/2 TABLET(100 MG) BY MOUTH DAILY  . b complex vitamins tablet Take 1 tablet by mouth daily.  . Calcium Carb-Cholecalciferol (CALCIUM 600-D PO) Take 600 mg by mouth daily.  . Cholecalciferol (VITAMIN D3) 25 MCG (1000 UT) CHEW Chew by mouth daily.  . clobetasol cream (TEMOVATE) 3.76 % Apply 1 application topically 2 (two) times daily as needed (itching/ do not use on face).  Marland Kitchen diltiazem (DILACOR XR) 180 MG 24 hr capsule Take 1 capsule (180 mg total) by mouth daily.  . hydrochlorothiazide (MICROZIDE) 12.5 MG capsule Take 12.5 mg by mouth daily.  . hydrocortisone cream 1 % Apply 1 application topically 2 (two) times daily.  Marland Kitchen LANTUS SOLOSTAR 100 UNIT/ML Solostar Pen   . levocetirizine (XYZAL) 5 MG tablet Take 5 mg by mouth every evening.  Marland Kitchen levothyroxine (SYNTHROID, LEVOTHROID) 100 MCG tablet Take 1 tablet (100 mcg total) by mouth daily before breakfast.  . meclizine (ANTIVERT) 25 MG tablet Take 25 mg by mouth 4 (four) times daily as needed for dizziness.  . mupirocin ointment (BACTROBAN) 2 % Apply 1 application topically daily as needed (itching).   Glory Rosebush ULTRA test strip   . permethrin (ELIMITE) 5 % cream APPLY EXTERNALLY TO THE AFFECTED AREA 1 TIME. LEAVE ON OVER NIGHT THEN RINSE OFF THE FOLLOWING DAY. MAY REPEAT IN 2 WEEKS  . rosuvastatin (CRESTOR) 20 MG tablet Take 1 tablet (20 mg total) by mouth daily.  . TRADJENTA 5 MG TABS tablet Take 5 mg by mouth daily.  Marland Kitchen triamcinolone cream (KENALOG) 0.1 % Apply 1 application  topically 2 (two) times daily.  Alveda Reasons 15 MG TABS tablet TAKE 1 TABLET BY MOUTH DAILY WITH SUPPER   Cardiac Studies:   Coronary angiogram  06/24/2016 Mild pulmonary hypertension suggestive of primary pulmonary hypertension, mean PA pressure of 26 mmHg.  proximal LAD revealing a 50% to 60% stenosis and calcified, No significant change from 04/20/2015.  Mild aortic stenosis with a 12 mm pressure gradient across the aortic valve.  Lexiscan myoview stress test 04/26/2018:  1. Lexiscan stress test performed. Exercise capacity not assessed. Stress symptoms included dizziness and chest pain/. Normal blood pressure. The resting electrocardiogram demonstrated atrial fibrillation with rapid ventricular rate, normal resting conduction and nonspecific ST-T changes.  Stress EKG is non diagnostic for ischemia as it is a pharmacologic stress.  2. The overall quality of the study is excellent. There is no evidence of abnormal lung activity. Stress and rest SPECT images demonstrate homogeneous tracer distribution throughout the myocardium. Gated SPECT imaging reveals normal myocardial thickening and wall motion. The left ventricular ejection fraction was normal (65%).   3. Low risk study.   Echocardiogram 03/28/2020: Left ventricle cavity is normal in size. Mild concentric hypertrophy of the left ventricle. Normal global wall motion. Normal LV systolic function with EF 55%. Unable to evaluate diastolic function due to atrial fibrillation. Left atrial cavity is moderately dilated. Trileaflet aortic valve with moderate aortic valve leaflet calcification. At least moderate aortic stenosis. Aortic valve mean gradient of 15 mmHg, Vmax of 2.6  m/s. Calculated aortic valve area by continuity equation is 0.8 cm. Dimensionless index of 0.23. Significant discrepancy between AVA and mean PG, Vmax. Consider alternate noninvasive/ invasive modality for aortic stenosis assessment, as clinically indicated.  Moderate (Grade II)  aortic regurgitation. Moderate (Grade III) mitral regurgitation. Moderate tricuspid regurgitation. Estimated pulmonary artery systolic pressure  is 27 mmHg. Mild pulmonic regurgitation. Compared to previous study in 2018, no signifciant changes noted in aortic valve. PASP reduced from 46 mmHg then to 27 mmHg now.   EKG:  EKG 03/23/2020: Atrial fibrillation with rapid ventricular response at the rate of 111 bpm, normal axis, poor R wave progression.  Low-voltage complexes, pulmonary disease pattern.  Nonspecific ST-T abnormality. No significant change from EKG 09/28/2019: Atrial fibrillation with controlled ventricular response at the rate of 100 bpm   Assessment     ICD-10-CM   1. Permanent atrial fibrillation (HCC)  I48.21   2. Coronary artery disease involving native coronary artery of native heart without angina pectoris  I25.10 Pulse oximetry, overnight  3. Dyspnea on exertion  R06.00 Pulse oximetry, overnight  4. Nocturnal hypoxemia  G47.34 Pulse oximetry, overnight  5. Nonrheumatic aortic valve stenosis  I35.0     Recommendations:   [name]  is a 86 y.o. female Caucasian patient with rare form of low-grade lung carcinoid, permanent atrial fibrillation, moderate diffuse coronary artery disease, asymptomatic right subclavian artery stenosis, hypertension, type II diabetes mellitus, hyperlipidemia. Except for chronic dyspnea, and chronic fatigue states that she is doing well, no clinical evidence of heart failure.    Diabetes continues to be uncontrolled.  Atrial fibrillation remains rate controlled on present medical therapy.  Without amiodarone her heart rate control was extremely difficult.  On her last OV due to dyspnea, fatigue, I had ordered nocturnal oxymetry, but patient did not do it. Will repeat the test and I have discussed with the patient that she may benefit from O2 therapy and may also help with DM control and heart failure and arrhythmias.  Lipids are well  controlled on Crestor.  Aortic stenosis appears stable and will continue to monitor. Do not suspect severe AS although calculated AVA is revealing severe AS.    Adrian Prows, MD, [facility] 09/24/2020, 5:23 PM Office: [phone]

## 2020-10-03 ENCOUNTER — Telehealth: Payer: Self-pay | Admitting: Student

## 2020-10-03 ENCOUNTER — Other Ambulatory Visit: Payer: Self-pay | Admitting: Cardiology

## 2020-10-03 NOTE — Telephone Encounter (Signed)
Error

## 2020-10-05 ENCOUNTER — Telehealth: Payer: Self-pay

## 2020-10-05 NOTE — Telephone Encounter (Signed)
Received message from patient to cb about monitor. Called patient back, patient stated she spoke with another staff member and "everything is taken care of" No further concerns or questions.

## 2020-10-12 NOTE — Progress Notes (Signed)
I spoke to patient and let her know that her levels didn't drop very much. She said she didn't want the o2 right now then.

## 2020-10-12 NOTE — Progress Notes (Signed)
Nocturnal oximetry 10/04/2020: SPO2 <88% was 5.4 minutes and <89% was 9.3 minutes, consecutive <88% 1 minute.  Awake SPO2 92%. Oxygen desaturation events 24, index 30. Lowest SPO2 80%, highest SpO2 99%.  Impression: Very mild abnormality with SPO2 less than 88% for 5 minutes.  Awake SPO2 greater than or equal to 92%.  Although patient qualifies for nocturnal oxygen supplementation, not convinced it would be helpful.   Adrian Prows, MD, [facility] 10/12/2020, 7:26 AM Office: [phone]

## 2020-10-12 NOTE — Progress Notes (Signed)
Let patient know. Read above. If she still wants to try O2, I can try to get this

## 2020-10-24 ENCOUNTER — Other Ambulatory Visit: Payer: Self-pay

## 2020-10-24 ENCOUNTER — Ambulatory Visit: Payer: Medicare Other | Admitting: Podiatry

## 2020-10-24 DIAGNOSIS — M722 Plantar fascial fibromatosis: Secondary | ICD-10-CM | POA: Diagnosis not present

## 2020-10-24 NOTE — Progress Notes (Signed)
   HPI: 86 y.o. female presenting today for evaluation of pain to the left arch of the foot.  She has noticed a lump that is been slowly growing to the plantar arch of the left foot over the last 2-3 years.  It is increasingly becoming symptomatic and painful.  She would like to have it evaluated.  She presents for further treatment and evaluation  Past Medical History:  Diagnosis Date  . Acute diverticulitis 06/2017  . Anemia 06/2017  . ARF (acute renal failure) ([place]) 06/2017  . CAD (coronary artery disease) 03/13/2012  . Diabetes mellitus   . Dysrhythmia    ATRIAL FIB  . Fall 07/07/2017  . HTN (hypertension) 03/13/2012  . Hypothyroid 03/13/2012  . Lung cancer ([place])   . Shortness of breath   . Stroke ([place])   . Syncope and collapse 04/02/2016  . Type II or unspecified type diabetes mellitus without mention of complication, not stated as uncontrolled 03/13/2012     Physical Exam: General: The patient is alert and oriented x3 in no acute distress.  Dermatology: Skin is warm, dry and supple bilateral lower extremities. Negative for open lesions or macerations.  Vascular: Palpable pedal pulses bilaterally. No edema or erythema noted. Capillary refill within normal limits.  Neurological: Epicritic and protective threshold grossly intact bilaterally.   Musculoskeletal Exam: Range of motion within normal limits to all pedal and ankle joints bilateral. Muscle strength 5/5 in all groups bilateral.  Nonadhered soft tissue mass noted along the medial longitudinal arch of the plantar fascia left foot.  Findings consistent with plantar fibroma.  There is associated tenderness to palpation  Assessment: 1.  Plantar fibroma left foot  Plan of Care:  1. Patient evaluated.  2.  Today we discussed surgical versus conservative treatment and management of the plantar fibroma.  It is symptomatic so we will pursue some conservative treatment options prior to surgical excision. 3.  Injection of 0.5 cc  Celestone Soluspan injected into the plantar fibroma lesion left foot 4.  Continue wearing good supportive soft shoes that do not irritate the area 5.  Return to clinic in 4 weeks, if she is still symptomatic we may need to proceed with surgical excision of the plantar fibroma.  She will require medical clearance prior to surgery      Edrick Kins, DPM [facility]  Dr. Edrick Kins, DPM    [address], [place] [address]                Office [phone]  Fax [phone]

## 2020-11-26 ENCOUNTER — Ambulatory Visit: Payer: Medicare Other | Admitting: Podiatry

## 2020-12-05 ENCOUNTER — Ambulatory Visit: Payer: Medicare Other | Admitting: Podiatry

## 2020-12-05 ENCOUNTER — Other Ambulatory Visit: Payer: Self-pay

## 2020-12-05 DIAGNOSIS — M722 Plantar fascial fibromatosis: Secondary | ICD-10-CM | POA: Diagnosis not present

## 2020-12-05 MED ORDER — BETAMETHASONE SOD PHOS & ACET 6 (3-3) MG/ML IJ SUSP
3.0000 mg | Freq: Once | INTRAMUSCULAR | Status: AC
  Administered 2020-12-05: 3 mg via INTRAMUSCULAR

## 2020-12-05 NOTE — Progress Notes (Signed)
   HPI: 86 y.o. female presenting today for follow-up evaluation of a plantar fibroma to the left foot.  Patient states she is doing much better.  She states that the injection helped significantly with the size of the fibroma as well as with the pain.  She presents for further treatment and evaluation.  No new complaints at this time  Past Medical History:  Diagnosis Date  . Acute diverticulitis 06/2017  . Anemia 06/2017  . ARF (acute renal failure) ([place]) 06/2017  . CAD (coronary artery disease) 03/13/2012  . Diabetes mellitus   . Dysrhythmia    ATRIAL FIB  . Fall 07/07/2017  . HTN (hypertension) 03/13/2012  . Hypothyroid 03/13/2012  . Lung cancer ([place])   . Shortness of breath   . Stroke ([place])   . Syncope and collapse 04/02/2016  . Type II or unspecified type diabetes mellitus without mention of complication, not stated as uncontrolled 03/13/2012     Physical Exam: General: The patient is alert and oriented x3 in no acute distress.  Dermatology: Skin is warm, dry and supple bilateral lower extremities. Negative for open lesions or macerations.  Vascular: Palpable pedal pulses bilaterally. No edema or erythema noted. Capillary refill within normal limits.  Neurological: Epicritic and protective threshold grossly intact bilaterally.   Musculoskeletal Exam: Range of motion within normal limits to all pedal and ankle joints bilateral. Muscle strength 5/5 in all groups bilateral.  Nonadhered soft tissue mass noted along the medial longitudinal arch of the plantar fascia left foot.  Findings consistent with plantar fibroma.  There is associated tenderness to palpation  Assessment: 1.  Plantar fibroma left foot  Plan of Care:  1. Patient evaluated.  2.  Today we discussed surgical versus conservative treatment and management of the plantar fibroma.  It is symptomatic so we will pursue some conservative treatment options prior to surgical excision. 3.  Injection of 0.5 cc Celestone  Soluspan injected into the plantar fibroma lesion left foot 4.  Continue wearing good supportive soft shoes that do not irritate the area 5.  The patient informed me today that she is coded multiple times during different operative procedures.  Today we will pursue only conservative treatment.  It sounds like surgical intervention under anesthesia is not a viable option for the patient 6.  Return to clinic as needed      Edrick Kins, DPM [facility]  Dr. Edrick Kins, DPM    [address], [place] [address]                Office [phone]  Fax [phone]

## 2020-12-14 ENCOUNTER — Other Ambulatory Visit: Payer: Self-pay | Admitting: Cardiology

## 2020-12-14 DIAGNOSIS — I4821 Permanent atrial fibrillation: Secondary | ICD-10-CM

## 2021-03-25 ENCOUNTER — Ambulatory Visit: Payer: Medicare Other | Admitting: Cardiology

## 2021-03-25 ENCOUNTER — Other Ambulatory Visit: Payer: Self-pay

## 2021-03-25 ENCOUNTER — Encounter: Payer: Self-pay | Admitting: Cardiology

## 2021-03-25 VITALS — BP 120/79 | HR 97 | Temp 98.3°F | Resp 16 | Ht 63.0 in | Wt 129.2 lb

## 2021-03-25 DIAGNOSIS — I4821 Permanent atrial fibrillation: Secondary | ICD-10-CM

## 2021-03-25 DIAGNOSIS — E7800 Pure hypercholesterolemia, unspecified: Secondary | ICD-10-CM

## 2021-03-25 DIAGNOSIS — I2510 Atherosclerotic heart disease of native coronary artery without angina pectoris: Secondary | ICD-10-CM

## 2021-03-25 DIAGNOSIS — I10 Essential (primary) hypertension: Secondary | ICD-10-CM

## 2021-03-25 MED ORDER — METOPROLOL SUCCINATE ER 25 MG PO TB24
25.0000 mg | ORAL_TABLET | Freq: Every day | ORAL | 2 refills | Status: DC
Start: 2021-03-25 — End: 2021-10-15

## 2021-03-25 NOTE — Progress Notes (Signed)
Primary Physician/Referring:  Tamsen Roers, MD  Patient ID: [name], female    DOB: [birth year], 87 y.o.   MRN: [id number]  Chief Complaint  Patient presents with  . Atrial Fibrillation  . Aortic Stenosis  . Follow-up    6 month   HPI: [name]  is a 87 y.o. female Caucasian patient with rare form of low-grade lung carcinoid, permanent atrial fibrillation, moderate diffuse coronary artery disease, asymptomatic right subclavian artery stenosis, hypertension, type II diabetes mellitus, hyperlipidemia. She now presents here for 33-monthfollow-up.   She has chronic fatigue and also chronic dyspnea on exertion but denies PND or orthopnea or leg edema.  No chest pain or palpitations. She had an accidental fall in Jan 2022.   Past Medical History:  Diagnosis Date  . Acute diverticulitis 06/2017  . Anemia 06/2017  . [place]  . CAD (coronary artery disease) 03/13/2012  . Diabetes mellitus   . Dysrhythmia    ATRIAL FIB  . Fall 07/07/2017  . HTN (hypertension) 03/13/2012  . Hypothyroid 03/13/2012  . [place]   . Shortness of breath   . [place] [place]   . Syncope and collapse 04/02/2016  . Type II or unspecified type diabetes mellitus without mention of complication, not stated as uncontrolled 03/13/2012   Past Surgical History:  Procedure Laterality Date  . ABDOMINAL HYSTERECTOMY    . BREAST EXCISIONAL BIOPSY Left   . BREAST SURGERY     benign tumors removed in 1980  . CARDIAC CATHETERIZATION    . CARDIOVERSION  04/20/2012   Procedure: CARDIOVERSION;  Surgeon: JLaverda Page MD;  [facility]  Service: Cardiovascular;  Laterality: N/A;  . CARDIOVERSION N/A 05/10/2013   Procedure: CARDIOVERSION;  Surgeon: JLaverda Page MD;  [facility] [facility]  Service: Cardiovascular;  Laterality: N/A;  . CHOLECYSTECTOMY    . HEMORRHOID SURGERY    . jaw replacement    . LEFT HEART CATHETERIZATION WITH CORONARY ANGIOGRAM N/A  04/20/2015   Procedure: LEFT HEART CATHETERIZATION WITH CORONARY ANGIOGRAM;  Surgeon: JAdrian Prows MD;  [facility];  Service: Cardiovascular;  Laterality: N/A;  . tumors     back of head   Social History   Tobacco Use  . Smoking status: Never Smoker  . Smokeless tobacco: Never Used  Substance Use Topics  . Alcohol use: No    Review of Systems  Constitutional: Positive for malaise/fatigue.  Cardiovascular: Positive for dyspnea on exertion (chronic). Negative for chest pain and leg swelling.  Musculoskeletal: Positive for back pain.  Gastrointestinal: Negative for melena.  Neurological: Positive for tremors.   Objective  Blood pressure 120/79, pulse 97, temperature 98.3 F (36.8 C), temperature source Temporal, resp. rate 16, height '5\' 3"'  (1.6 m), weight 129 lb 3.2 oz (58.6 kg), SpO2 95 %. Body mass index is 22.89 kg/m.   Vitals with BMI 03/25/2021 09/24/2020 03/23/2020  Height '5\' 3"'  '5\' 3"'  '5\' 3"'   Weight 129 lbs 3 oz 131 lbs 13 oz 137 lbs  BMI 22.89 [id number] Systolic [id number] Diastolic 79 87 80  Pulse 97 101 82      Physical Exam Constitutional:      General: She is not in acute distress.    Comments: Moderately built  Cardiovascular:     Rate and Rhythm: Tachycardia present. Rhythm irregular.     Pulses:          Carotid pulses are 2+ on  the right side and 2+ on the left side with bruit.      Femoral pulses are 2+ on the right side and 2+ on the left side.      Popliteal pulses are 0 on the right side and 2+ on the left side.       Dorsalis pedis pulses are 1+ on the right side and 2+ on the left side.       Posterior tibial pulses are 0 on the right side and 0 on the left side.     Heart sounds: Heart sounds are distant. Murmur heard.  High-pitched midsystolic murmur is present with a grade of 2/6 at the upper right sternal border. No gallop. No S3 or S4 sounds.      Comments: S1 is variable, S2 is normal. No edema. No JVD.  Left leg traumatic superficial  cellulitis like change noted.  Pulmonary:     Effort: Pulmonary effort is normal.     Breath sounds: Normal breath sounds.  Abdominal:     General: Bowel sounds are normal.     Palpations: Abdomen is soft.    Radiology: No results found.  Laboratory examination:   CMP Latest Ref Rng & Units 09/12/2019 07/05/2019 06/20/2019  Glucose 65 - 99 mg/dL 128(H) 116(H) 114(H)  BUN 8 - 27 mg/dL '16 25 19  ' Creatinine 0.57 - 1.00 mg/dL 1.49(H) 1.53(H) 1.77(H)  Sodium 134 - 144 mmol/L 143 142 141  Potassium 3.5 - 5.2 mmol/L 3.5 4.8 4.1  Chloride 96 - 106 mmol/L 102 104 103  CO2 20 - 29 mmol/L '22 22 22  ' Calcium 8.7 - 10.3 mg/dL 9.1 - 8.8  Total Protein 6.0 - 8.5 g/dL 6.9 - 6.6  Total Bilirubin 0.0 - 1.2 mg/dL 0.5 - 0.5  Alkaline Phos 39 - 117 IU/L 60 - 66  AST 0 - 40 IU/L 24 - 21  ALT 0 - 32 IU/L 23 - 14   CBC Latest Ref Rng & Units 06/20/2019 08/16/2018 07/08/2017  WBC 3.4 - 10.8 x10E3/uL 5.4 4.7 4.4  Hemoglobin 11.1 - 15.9 g/dL 12.0 10.7(L) 10.6(L)  Hematocrit 34.0 - 46.6 % 36.1 32.9(L) 32.0(L)  Platelets 150 - 450 x10E3/uL 130(L) 139(L) 94(L)    Lipid Panel     Component Value Date/Time   CHOL 116 09/12/2019 0957   TRIG 109 09/12/2019 0957   HDL 50 09/12/2019 0957   CHOLHDL 2.3 09/12/2019 0957   CHOLHDL 3.8 12/16/2016 0312   VLDL 26 12/16/2016 0312   LDLCALC 46 09/12/2019 0957   LABVLDL 20 09/12/2019 0957    External labs:    A1C 9.100 % 09/28/2020  Creatinine, Serum 1.610 MG/ 09/28/2020  A1C 13.000 % 02/08/2020 TSH 2.420 02/08/2020  Creatinine, Serum 1.420 MG/ 02/08/2020 ALT (SGPT) 18.000 IU/ 02/08/2020  Cholesterol, total 116.000 09/12/2019 HDL 50.000 09/12/2019 LDL  45 Triglycerides 109.000 09/12/2019  A1C 8.800 % 11/16/2019; TSH 6.340 11/16/2019  Hemoglobin 12.000 G/ 06/20/2019; Platelets 130.000 06/20/2019  Creatinine, Serum 1.420 MG/ 02/08/2020 Potassium 3.500 09/12/2019 ALT (SGPT) 18.000 IU/ 02/08/2020  Labs 08/16/2018: Serum glucose 169 mg, BUN 9, creatinine 0.93,  eGFR 56 mL, HB 10.7/HCT 32.9, platelets 139.  TSH markedly reduced at 0.012.  Total cholesterol 120, triglycerides 130, HDL 32, LDL 62.  Allergies  Allergen Reactions  . Codeine Nausea And Vomiting  . Hydrocodone-Acetaminophen Nausea And Vomiting    Medications   Current Outpatient Medications on File Prior to Visit  Medication Sig Dispense Refill  . amiodarone (PACERONE) 200 MG tablet  TAKE 1/2 TABLET(100 MG) BY MOUTH DAILY 90 tablet 1  . b complex vitamins tablet Take 1 tablet by mouth daily.    . betamethasone dipropionate 0.05 % lotion Apply topically 2 (two) times daily.    . Calcium Carb-Cholecalciferol (CALCIUM 600-D PO) Take 600 mg by mouth daily.    . clobetasol cream (TEMOVATE) 7.32 % Apply 1 application topically 2 (two) times daily as needed (itching/ do not use on face).    Marland Kitchen DILT-XR 180 MG 24 hr capsule TAKE 1 CAPSULE(180 MG) BY MOUTH DAILY 90 capsule 3  . hydrochlorothiazide (MICROZIDE) 12.5 MG capsule Take 12.5 mg by mouth daily.    . hydrocortisone cream 1 % Apply 1 application topically 2 (two) times daily.  2  . levocetirizine (XYZAL) 5 MG tablet Take 5 mg by mouth every evening.    Marland Kitchen levothyroxine (SYNTHROID, LEVOTHROID) 100 MCG tablet Take 1 tablet (100 mcg total) by mouth daily before breakfast. 30 tablet 3  . meclizine (ANTIVERT) 25 MG tablet Take 25 mg by mouth 4 (four) times daily as needed for dizziness.    . mupirocin ointment (BACTROBAN) 2 % Apply 1 application topically daily as needed (itching).     Glory Rosebush ULTRA test strip     . permethrin (ELIMITE) 5 % cream APPLY EXTERNALLY TO THE AFFECTED AREA 1 TIME. LEAVE ON OVER NIGHT THEN RINSE OFF THE FOLLOWING DAY. MAY REPEAT IN 2 WEEKS    . rosuvastatin (CRESTOR) 20 MG tablet Take 1 tablet (20 mg total) by mouth daily. 30 tablet 2  . TRADJENTA 5 MG TABS tablet Take 5 mg by mouth daily.    Marland Kitchen triamcinolone cream (KENALOG) 0.1 % Apply 1 application topically 2 (two) times daily. 15 g 0  . XARELTO 15 MG TABS tablet  TAKE 1 TABLET BY MOUTH DAILY WITH SUPPER 90 tablet 1  . acetaminophen (TYLENOL) 500 MG tablet Take 500 mg by mouth every 6 (six) hours as needed for moderate pain or headache.     Marland Kitchen LANTUS SOLOSTAR 100 UNIT/ML Solostar Pen  (Patient not taking: Reported on 10/24/2020)     No current facility-administered medications on file prior to visit.    Cardiac Studies:   Coronary angiogram  06/24/2016 Mild pulmonary hypertension suggestive of primary pulmonary hypertension, mean PA pressure of 26 mmHg.  proximal LAD revealing a 50% to 60% stenosis and calcified, No significant change from 04/20/2015.  Mild aortic stenosis with a 12 mm pressure gradient across the aortic valve.  Lexiscan myoview stress test 04/26/2018:  1. Lexiscan stress test performed. Exercise capacity not assessed. Stress symptoms included dizziness and chest pain/. Normal blood pressure. The resting electrocardiogram demonstrated atrial fibrillation with rapid ventricular rate, normal resting conduction and nonspecific ST-T changes.  Stress EKG is non diagnostic for ischemia as it is a pharmacologic stress.  2. The overall quality of the study is excellent. There is no evidence of abnormal lung activity. Stress and rest SPECT images demonstrate homogeneous tracer distribution throughout the myocardium. Gated SPECT imaging reveals normal myocardial thickening and wall motion. The left ventricular ejection fraction was normal (65%).   3. Low risk study.   Echocardiogram 03/28/2020: Left ventricle cavity is normal in size. Mild concentric hypertrophy of the left ventricle. Normal global wall motion. Normal LV systolic function with EF 55%. Unable to evaluate diastolic function due to atrial fibrillation. Left atrial cavity is moderately dilated. Trileaflet aortic valve with moderate aortic valve leaflet calcification. At least moderate aortic stenosis. Aortic valve mean gradient  of 15 mmHg, Vmax of 2.6  m/s. Calculated aortic valve area by  continuity equation is 0.8 cm. Dimensionless index of 0.23. Significant discrepancy between AVA and mean PG, Vmax. Consider alternate noninvasive/ invasive modality for aortic stenosis assessment, as clinically indicated.  Moderate (Grade II) aortic regurgitation. Moderate (Grade III) mitral regurgitation. Moderate tricuspid regurgitation. Estimated pulmonary artery systolic pressure is 27 mmHg. Mild pulmonic regurgitation. Compared to previous study in 2018, no signifciant changes noted in aortic valve. PASP reduced from 46 mmHg then to 27 mmHg now.   Nocturnal oximetry 10/04/2020: SPO2 <88% was 5.4 minutes and <89% was 9.3 minutes, consecutive <88% 1 minute.  Awake SPO2 92%. Oxygen desaturation events 24, index 30. Lowest SPO2 80%, highest SpO2 99%.  Impression: Very mild abnormality with SPO2 less than 88% for 5 minutes.  Awake SPO2 greater than or equal to 92%.  Although patient qualifies for nocturnal oxygen supplementation, not convinced it would be helpful.  EKG:   EKG 03/25/2021: Atrial fibrillation with rapid ventricular response at rate of 108 bpm, normal axis, poor R wave progression, cannot exclude anteroseptal infarct old.  Low-voltage complexes -possible pulmonary disease.  No significant change from 03/23/2020.     Assessment     ICD-10-CM   1. Permanent atrial fibrillation (HCC)  I48.21 EKG 12-Lead    CBC    CMP14+EGFR    metoprolol succinate (TOPROL-XL) 25 MG 24 hr tablet  2. Coronary artery disease involving native coronary artery of native heart without angina pectoris  I25.10   3. Primary hypertension  I10 TSH+T4F+T3Free  4. Hypercholesteremia  E78.00 Lipid Panel With LDL/HDL Ratio    CHA2DS2-VASc Score is 6.  Yearly risk of stroke: 9.8% (F, A, HTN, DM, Vasc Dz).  Score of 1=0.6; 2=2.2; 3=3.2; 4=4.8; 5=7.2; 6=9.8; 7=>9.8) -(CHF; HTN; vasc disease DM,  Female = 1; Age <65 =0; 65-74 = 1,  >75 =2; stroke/embolism= 2).    Meds ordered this encounter  Medications  .  metoprolol succinate (TOPROL-XL) 25 MG 24 hr tablet    Sig: Take 1 tablet (25 mg total) by mouth daily. Take with or immediately following a meal.    Dispense:  30 tablet    Refill:  2    Medications Discontinued During This Encounter  Medication Reason  . Cholecalciferol (VITAMIN D3) 25 MCG (1000 UT) CHEW Error    Orders Placed This Encounter  Procedures  . CBC  . CMP14+EGFR  . Lipid Panel With LDL/HDL Ratio  . TSH+T4F+T3Free  . EKG 12-Lead    Recommendations:   [name]  is a 87 y.o. female Caucasian patient with rare form of low-grade lung carcinoid, permanent atrial fibrillation, moderate diffuse coronary artery disease, asymptomatic right subclavian artery stenosis, hypertension, type II diabetes mellitus, hyperlipidemia.   This is a 75-monthoffice visit.  States that she is doing well except she does get tired easily and no significant change in chronic dyspnea.  No leg edema, no palpitations.  No chest pain.  She is tolerating anticoagulation with Xarelto and also amiodarone for atrial fibrillation without any side effects. Without amiodarone her heart rate control was extremely difficult.  Would like to try metoprolol succinate 25 mg daily to improve her heart rate control.  I would like to see him back in 4 to 6 weeks for follow-up.  She needs surveillance of her lipids, electrolytes in view of being on amiodarone along with TSH and T3 and T4.  Fortunately no clinical evidence of heart failure, no dizziness or  syncope, blood pressure has been well controlled.    Adrian Prows, MD, [facility] 03/25/2021, 1:41 PM Office: [phone]

## 2021-03-26 LAB — CMP14+EGFR
ALT: 22 IU/L (ref 0–32)
AST: 22 IU/L (ref 0–40)
Albumin/Globulin Ratio: 1.7 (ref 1.2–2.2)
Albumin: 4.1 g/dL (ref 3.6–4.6)
Alkaline Phosphatase: 91 IU/L (ref 44–121)
BUN/Creatinine Ratio: 21 (ref 12–28)
BUN: 29 mg/dL — ABNORMAL HIGH (ref 8–27)
Bilirubin Total: 0.5 mg/dL (ref 0.0–1.2)
CO2: 23 mmol/L (ref 20–29)
Calcium: 9.6 mg/dL (ref 8.7–10.3)
Chloride: 98 mmol/L (ref 96–106)
Creatinine, Ser: 1.4 mg/dL — ABNORMAL HIGH (ref 0.57–1.00)
Globulin, Total: 2.4 g/dL (ref 1.5–4.5)
Glucose: 173 mg/dL — ABNORMAL HIGH (ref 65–99)
Potassium: 5 mmol/L (ref 3.5–5.2)
Sodium: 137 mmol/L (ref 134–144)
Total Protein: 6.5 g/dL (ref 6.0–8.5)
eGFR: 37 mL/min/{1.73_m2} — ABNORMAL LOW (ref >59)

## 2021-03-26 LAB — LIPID PANEL WITH LDL/HDL RATIO
Cholesterol, Total: 102 mg/dL (ref 100–199)
HDL: 41 mg/dL (ref >39)
LDL Chol Calc (NIH): 41 mg/dL (ref 0–99)
LDL/HDL Ratio: 1 ratio (ref 0.0–3.2)
Triglycerides: 104 mg/dL (ref 0–149)
VLDL Cholesterol Cal: 20 mg/dL (ref 5–40)

## 2021-03-26 LAB — CBC
Hematocrit: 34.6 % (ref 34.0–46.6)
Hemoglobin: 11.3 g/dL (ref 11.1–15.9)
MCH: 28.9 pg (ref 26.6–33.0)
MCHC: 32.7 g/dL (ref 31.5–35.7)
MCV: 89 fL (ref 79–97)
Platelets: 134 10*3/uL — ABNORMAL LOW (ref 150–450)
RBC: 3.91 x10E6/uL (ref 3.77–5.28)
RDW: 13.9 % (ref 11.7–15.4)
WBC: 5.8 10*3/uL (ref 3.4–10.8)

## 2021-03-26 LAB — TSH+T4F+T3FREE
Free T4: 2.27 ng/dL — ABNORMAL HIGH (ref 0.82–1.77)
T3, Free: 2.1 pg/mL (ref 2.0–4.4)
TSH: 0.288 u[IU]/mL — ABNORMAL LOW (ref 0.450–4.500)

## 2021-03-26 NOTE — Progress Notes (Signed)
Normal CBC except for mild thrombocytopenia that is chronic but stable.  CMP reveals chronic stage III kidney disease again there is stable from 2 to 3 years ago.  Cholesterol is normal.  I will forward a copy to PCP.

## 2021-03-26 NOTE — Progress Notes (Signed)
Called patient, NA, no Vmbox set up to leave a message.

## 2021-03-27 NOTE — Progress Notes (Signed)
Called and spoke with patient regarding her lab results.

## 2021-05-06 ENCOUNTER — Ambulatory Visit: Payer: Medicare Other | Admitting: Cardiology

## 2021-05-08 ENCOUNTER — Other Ambulatory Visit: Payer: Self-pay

## 2021-05-08 ENCOUNTER — Ambulatory Visit (HOSPITAL_COMMUNITY)
Admission: EM | Admit: 2021-05-08 | Discharge: 2021-05-08 | Disposition: A | Discharge status: Home / Self Care | Payer: Medicare Other | Attending: Family Medicine | Admitting: Family Medicine

## 2021-05-08 ENCOUNTER — Encounter (HOSPITAL_COMMUNITY): Payer: Self-pay

## 2021-05-08 ENCOUNTER — Telehealth (HOSPITAL_COMMUNITY): Payer: Self-pay

## 2021-05-08 DIAGNOSIS — L299 Pruritus, unspecified: Secondary | ICD-10-CM | POA: Diagnosis not present

## 2021-05-08 DIAGNOSIS — H6193 Disorder of external ear, unspecified, bilateral: Secondary | ICD-10-CM

## 2021-05-08 MED ORDER — TRIAMCINOLONE ACETONIDE 0.1 % EX CREA: 1.0000 "application " | TOPICAL_CREAM | Freq: Two times a day (BID) | CUTANEOUS | 0 refills | Status: DC

## 2021-05-08 MED ORDER — MUPIROCIN 2 % EX OINT: 1.0000 "application " | TOPICAL_OINTMENT | Freq: Two times a day (BID) | CUTANEOUS | 0 refills | Status: DC

## 2021-05-08 MED ORDER — MUPIROCIN 2 % EX OINT
1.0000 | TOPICAL_OINTMENT | Freq: Two times a day (BID) | CUTANEOUS | 0 refills | Status: DC
Start: 2021-05-08 — End: 2021-05-08

## 2021-05-08 NOTE — ED Triage Notes (Signed)
Pt in with c/o bilateral ear pain that she noticed a few days ago  Pt states she stuck her finger in her right ear and she noticed blood  Pt states that everything sounds muffled

## 2021-05-08 NOTE — Discharge Instructions (Addendum)
Continue your daily antihistamine regimen for allergies in addition to these new topical creams as needed. Follow up with your Primary Physician for re-check of your symptoms within the next week or two.

## 2021-05-08 NOTE — ED Provider Notes (Signed)
[place]    CSN: [id number] Arrival date & time: 05/08/21  1124      History   Chief Complaint Chief Complaint  Patient presents with  . Otalgia    HPI [name] is a 87 y.o. female.   Presenting today with acute on chronic bilateral outer ear itching, sores, thick drainage from one area on upper left auricle.  She states this has been going on for over a year now and has seen multiple doctors about this with no diagnosis or significant resolution of symptoms.  She is constantly scratching at her ears because of the intense itching and states she feels like bugs are crawling around in her ear canals.  Was itching in the right ear canal yesterday and noticed some blood in her fingertip afterwards.  Of note, she is on Xarelto for atrial fibrillation.  She sometimes puts some "sav" of some sort on the open areas without benefit.  States this issue makes her very anxious and she is very frustrated by it at this point.       Past Medical History:  Diagnosis Date  . Acute diverticulitis 06/2017  . Anemia 06/2017  . ARF (acute renal failure) ([place]) 06/2017  . CAD (coronary artery disease) 03/13/2012  . Diabetes mellitus   . Dysrhythmia    ATRIAL FIB  . Fall 07/07/2017  . HTN (hypertension) 03/13/2012  . Hypothyroid 03/13/2012  . Lung cancer ([place])   . Shortness of breath   . Stroke ([place])   . Syncope and collapse 04/02/2016  . Type II or unspecified type diabetes mellitus without mention of complication, not stated as uncontrolled 03/13/2012    Patient Active Problem List   Diagnosis Date Noted  . Chronic atrial fibrillation with rapid ventricular response ([place]) 08/16/2018  . Chest pain 07/08/2017  . ARF (acute renal failure) ([place]) 07/08/2017  . Acute diverticulitis 07/08/2017  . Head injury   . Right hip pain 01/03/2017  . Muscle strain 01/03/2017  . Atrial fibrillation with rapid ventricular response ([place]) 12/12/2016  . Atrial fibrillation with RVR ([place])    . Diabetes mellitus with complication ([place])   . Faintness   . Thrombocytopenia ([place]) 04/07/2016  . Anemia 04/07/2016  . SOB (shortness of breath) on exertion   . Syncope and collapse   . Syncope 04/02/2016  . Physical deconditioning 04/20/2015  . Dyspnea 04/17/2015  . SOB (shortness of breath) 04/17/2015  . Acute renal failure superimposed on stage 3 chronic kidney disease ([place]) 04/17/2015  . Hypokalemia 04/17/2015  . Atrial fibrillation ([facility]) CHA2DS2-VASc Score 7 03/13/2012  . HTN (hypertension) 03/13/2012  . Hypothyroid 03/13/2012  . DM (diabetes mellitus), type 2, uncontrolled, with renal complications ([place]) 44/12/270  . CAD (coronary artery disease) 03/13/2012  . Carcinoid tumor of lung   . Stroke [facility])     Past Surgical History:  Procedure Laterality Date  . ABDOMINAL HYSTERECTOMY    . BREAST EXCISIONAL BIOPSY Left   . BREAST SURGERY     benign tumors removed in 1980  . CARDIAC CATHETERIZATION    . CARDIOVERSION  04/20/2012   Procedure: CARDIOVERSION;  Surgeon: Laverda Page, MD;  Location: [facility];  Service: Cardiovascular;  Laterality: N/A;  . CARDIOVERSION N/A 05/10/2013   Procedure: CARDIOVERSION;  Surgeon: Laverda Page, MD;  Location: [facility];  Service: Cardiovascular;  Laterality: N/A;  . CHOLECYSTECTOMY    . HEMORRHOID SURGERY    . jaw replacement    . LEFT HEART CATHETERIZATION WITH  CORONARY ANGIOGRAM N/A 04/20/2015   Procedure: LEFT HEART CATHETERIZATION WITH CORONARY ANGIOGRAM;  Surgeon: Adrian Prows, MD;  Location[facility] C[facility] LAB;  Service: Cardiovascular;  Laterality: N/A;  . tumors     back of head    OB History   No obstetric history on file.      Home Medications    Prior to Admission medications   Medication Sig Start Date End Date Taking? Authorizing Provider  acetaminophen (TYLENOL) 500 MG tablet Take 500 mg by mouth every 6 (six) hours as needed for moderate pain or headache.     [provider]  amiodarone (PACERONE) 200 MG  tablet TAKE 1/2 TABLET(100 MG) BY MOUTH DAILY 08/15/20   Adrian Prows, MD  b complex vitamins tablet Take 1 tablet by mouth daily.    [provider]  betamethasone dipropionate 0.05 % lotion Apply topically 2 (two) times daily. 09/27/20   [provider]  Calcium Carb-Cholecalciferol (CALCIUM 600-D PO) Take 600 mg by mouth daily.    [provider]  clobetasol cream (TEMOVATE) 2.42 % Apply 1 application topically 2 (two) times daily as needed (itching/ do not use on face).    [provider]  DILT-XR 180 MG 24 hr capsule TAKE 1 CAPSULE(180 MG) BY MOUTH DAILY 12/14/20   Adrian Prows, MD  hydrochlorothiazide (MICROZIDE) 12.5 MG capsule Take 12.5 mg by mouth daily.    [provider]  hydrocortisone cream 1 % Apply 1 application topically 2 (two) times daily. 07/14/18   [provider]  LANTUS SOLOSTAR 100 UNIT/ML Solostar Pen  02/21/20   [provider]  levocetirizine (XYZAL) 5 MG tablet Take 5 mg by mouth every evening.    [provider]  levothyroxine (SYNTHROID, LEVOTHROID) 100 MCG tablet Take 1 tablet (100 mcg total) by mouth daily before breakfast. 08/18/18   Adrian Prows, MD  meclizine (ANTIVERT) 25 MG tablet Take 25 mg by mouth 4 (four) times daily as needed for dizziness.    [provider]  metoprolol succinate (TOPROL-XL) 25 MG 24 hr tablet Take 1 tablet (25 mg total) by mouth daily. Take with or immediately following a meal. 03/25/21 06/23/21  Adrian Prows, MD  mupirocin ointment (BACTROBAN) 2 % Apply 1 application topically 2 (two) times daily. Apply to sores on outer ears 05/08/21   Volney American, PA-C  [facility] ULTRA test strip  02/14/20   [provider]  permethrin (ELIMITE) 5 % cream APPLY EXTERNALLY TO THE AFFECTED AREA 1 TIME. LEAVE ON OVER NIGHT THEN RINSE OFF THE FOLLOWING DAY. MAY REPEAT IN 2 WEEKS 01/26/20   [provider]  rosuvastatin (CRESTOR) 20 MG tablet Take 1 tablet (20 mg total)  by mouth daily. 08/03/19 09/24/20  Miquel Dunn, NP  TRADJENTA 5 MG TABS tablet Take 5 mg by mouth daily. 09/06/20   [provider]  triamcinolone cream (KENALOG) 0.1 % Apply 1 application topically 2 (two) times daily. 03/06/20   Vanessa Kick, MD  triamcinolone cream (KENALOG) 0.1 % Apply 1 application topically 2 (two) times daily. May place on a qtip and gently swirl in ear canals for itching twice daily 05/08/21   Volney American, PA-C  XARELTO 15 MG TABS tablet TAKE 1 TABLET BY MOUTH DAILY WITH SUPPER 02/22/20   Adrian Prows, MD    Family History Family History  Problem Relation Age of Onset  . Breast cancer Mother   . Aneurysm Father        Likely thoracic aneurysm  per patient  . Melanoma Brother   . Heart disease Sister   . Diabetes type II Sister     Social History Social History   Tobacco Use  . Smoking status: Never Smoker  . Smokeless tobacco: Never Used  Vaping Use  . Vaping Use: Never used  Substance Use Topics  . Alcohol use: No  . Drug use: No     Allergies   Codeine and Hydrocodone-acetaminophen   Review of Systems Review of Systems Per HPI  Physical Exam Triage Vital Signs ED Triage Vitals  Enc Vitals Group     BP 05/08/21 1319 (!) 144/74     Pulse Rate 05/08/21 1319 (!) 102     Resp 05/08/21 1319 18     Temp 05/08/21 1319 97.6 F (36.4 C)     Temp src --      SpO2 05/08/21 1319 98 %     Weight --      Height --      Head Circumference --      Peak Flow --      Pain Score 05/08/21 1317 7     Pain Loc --      Pain Edu? --      Excl. in [place]? --    No data found.  Updated Vital Signs BP (!) 144/74   Pulse (!) 102   Temp 97.6 F (36.4 C)   Resp 18   SpO2 98%   Visual Acuity Right Eye Distance:   Left Eye Distance:   Bilateral Distance:    Right Eye Near:   Left Eye Near:    Bilateral Near:     Physical Exam Vitals and nursing note reviewed.  Constitutional:      Appearance: Normal appearance. She is not  ill-appearing.  HENT:     Head: Atraumatic.     Ears:     Comments: Bilateral TMs benign, bilateral ear canals mildly erythematous and dry, no significant lesions to EACs bilaterally.  Multiple scabbed over lesions to outer ear, worst at superior portion bilaterally.  No active drainage from any of these areas.    Nose: Rhinorrhea present.     Mouth/Throat:     Mouth: Mucous membranes are moist.     Pharynx: Oropharynx is clear.  Eyes:     Extraocular Movements: Extraocular movements intact.     Conjunctiva/sclera: Conjunctivae normal.     Comments: Clear drainage from bilateral eyes which patient states is ongoing issue  Cardiovascular:     Rate and Rhythm: Normal rate.  Pulmonary:     Effort: Pulmonary effort is normal.     Breath sounds: Normal breath sounds.  Musculoskeletal:        General: Normal range of motion.     Cervical back: Normal range of motion and neck supple.  Skin:    General: Skin is warm and dry.  Neurological:     Mental Status: She is alert and oriented to person, place, and time.  Psychiatric:        Mood and Affect: Mood normal.        Thought Content: Thought content normal.        Judgment: Judgment normal.      UC Treatments / Results  Labs (all labs ordered are listed, but only abnormal results are displayed) Labs Reviewed - No data to display  EKG   Radiology No results found.  Procedures Procedures (including critical care time)  Medications Ordered in UC Medications -  No data to display  Initial Impression / Assessment and Plan / UC Course  I have reviewed the triage vital signs and the nursing notes.  Pertinent labs & imaging results that were available during my care of the patient were reviewed by me and considered in my medical decision making (see chart for details).     Suspect eczema to the outer ear is causing her chronic itching and the lesions to be from constant picking and scratching at the areas.  We will treat with  triamcinolone cream topically, mupirocin to the open areas.  She also has significant seasonal allergies, continue antihistamines daily for this.  Follow-up with primary care as soon as possible for recheck of symptoms.  Final Clinical Impressions(s) / UC Diagnoses   Final diagnoses:  Itching of ear  Lesion of both external ears     Discharge Instructions     Continue your daily antihistamine regimen for allergies in addition to these new topical creams as needed. Follow up with your Primary Physician for re-check of your symptoms within the next week or two.     ED Prescriptions    Medication Sig Dispense Auth. Provider   mupirocin ointment (BACTROBAN) 2 % Apply 1 application topically 2 (two) times daily. Apply to sores on outer ears 22 g Volney American, PA-C   triamcinolone cream (KENALOG) 0.1 % Apply 1 application topically 2 (two) times daily. May place on a qtip and gently swirl in ear canals for itching twice daily 30 g Volney American, [place]     PDMP not reviewed this encounter.   Volney American, [place] 05/08/21 1455

## 2021-05-28 ENCOUNTER — Encounter: Payer: Self-pay | Admitting: Cardiology

## 2021-05-28 ENCOUNTER — Other Ambulatory Visit: Payer: Self-pay

## 2021-05-28 ENCOUNTER — Ambulatory Visit: Payer: Medicare Other | Admitting: Cardiology

## 2021-05-28 VITALS — BP 106/71 | HR 120 | Temp 97.0°F | Resp 16 | Ht 63.0 in | Wt 122.0 lb

## 2021-05-28 DIAGNOSIS — I2510 Atherosclerotic heart disease of native coronary artery without angina pectoris: Secondary | ICD-10-CM

## 2021-05-28 DIAGNOSIS — I4821 Permanent atrial fibrillation: Secondary | ICD-10-CM

## 2021-05-28 DIAGNOSIS — K921 Melena: Secondary | ICD-10-CM

## 2021-05-28 DIAGNOSIS — I10 Essential (primary) hypertension: Secondary | ICD-10-CM

## 2021-05-28 NOTE — Progress Notes (Signed)
Primary Physician/Referring:  Tamsen Roers, MD  Patient ID: [name], female    DOB: 1934-12-08, 87 y.o.   MRN: [id number]  Chief Complaint  Patient presents with  . Permanent atrial fibrillation   . Hypertension  . Follow-up   HPI: [name]  is a 87 y.o. female Caucasian patient with rare form of low-grade lung carcinoid, permanent atrial fibrillation, moderate diffuse coronary artery disease, asymptomatic right subclavian artery stenosis, hypertension, type II diabetes mellitus, hyperlipidemia. She now presents here for 6-week follow-up.  Due to A. fib with RVR, I started her on metoprolol succinate 25 mg daily.  States that she is tolerating this and has not had any new abnormalities with regard to dyspnea, leg edema, dizziness.  She endorses that over the past 2 weeks she has been having bloody stools and also dark-colored stools.  Chronic dyspnea is unchanged.  Denies dizziness or syncope.  She is still recuperating from the accidental fall she had in January 2022 and feels generally weak and still has some back pain.   Past Medical History:  Diagnosis Date  . Acute diverticulitis 06/2017  . Anemia 06/2017  . ARF (acute renal failure) ([facility]) 06/2017  . CAD (coronary artery disease) 03/13/2012  . Diabetes mellitus   . Dysrhythmia    ATRIAL FIB  . Fall 07/07/2017  . HTN (hypertension) 03/13/2012  . Hypothyroid 03/13/2012  . Lung cancer ([facility])   . Shortness of breath   . Stroke ([facility])   . Syncope and collapse 04/02/2016  . Type II or unspecified type diabetes mellitus without mention of complication, not stated as uncontrolled 03/13/2012   Past Surgical History:  Procedure Laterality Date  . ABDOMINAL HYSTERECTOMY    . BREAST EXCISIONAL BIOPSY Left   . BREAST SURGERY     benign tumors removed in 1980  . CARDIAC CATHETERIZATION    . CARDIOVERSION  04/20/2012   Procedure: CARDIOVERSION;  Surgeon: Laverda Page, MD;  Location: [facility];  Service: Cardiovascular;   Laterality: N/A;  . CARDIOVERSION N/A 05/10/2013   Procedure: CARDIOVERSION;  Surgeon: Laverda Page, MD;  Location: [facility];  Service: Cardiovascular;  Laterality: N/A;  . CHOLECYSTECTOMY    . HEMORRHOID SURGERY    . jaw replacement    . LEFT HEART CATHETERIZATION WITH CORONARY ANGIOGRAM N/A 04/20/2015   Procedure: LEFT HEART CATHETERIZATION WITH CORONARY ANGIOGRAM;  Surgeon: Adrian Prows, MD;  Location: [facility] CATH LAB;  Service: Cardiovascular;  Laterality: N/A;  . tumors     back of head   Social History   Tobacco Use  . Smoking status: Never Smoker  . Smokeless tobacco: Never Used  Substance Use Topics  . Alcohol use: No    Review of Systems  Constitutional: Positive for malaise/fatigue.  Cardiovascular: Positive for dyspnea on exertion (chronic). Negative for chest pain and leg swelling.  Musculoskeletal: Positive for back pain.  Gastrointestinal: Positive for hematochezia and melena.  Neurological: Positive for tremors.   Objective  Blood pressure 106/71, pulse (!) 120, temperature (!) 97 F (36.1 C), temperature source Temporal, resp. rate 16, height _0  (1.6 m), weight 122 lb (55.3 kg), SpO2 97 %. Body mass index is 21.61 kg/m.   Vitals with BMI 05/28/2021 05/08/2021 03/25/2021  Height _1  - _2   Weight 122 lbs - 129 lbs 3 oz  BMI 21.11 - 73.56  Systolic 701 410 301  Diastolic 71 74 79  Pulse 314 102 97    Orthostatic VS for the past  72 hrs (Last 3 readings):  Orthostatic BP Patient Position BP Location Cuff Size Orthostatic Pulse  05/28/21 1619 117/52 Standing Left Arm Normal 108  05/28/21 1618 122/73 Sitting Left Arm Normal 107  05/28/21 1617 120/75 Supine Left Arm Normal 102     Physical Exam Constitutional:      General: She is not in acute distress.    Comments: Moderately built  Neck:     Vascular: Carotid bruit (left carotid) present. No JVD.  Cardiovascular:     Rate and Rhythm: Tachycardia present. Rhythm irregular.     Pulses:           Femoral pulses are 2+ on the right side and 2+ on the left side.      Popliteal pulses are 0 on the right side and 2+ on the left side.       Dorsalis pedis pulses are 1+ on the right side and 2+ on the left side.       Posterior tibial pulses are 0 on the right side and 0 on the left side.     Heart sounds: Heart sounds are distant. Murmur heard.  High-pitched midsystolic murmur is present with a grade of 2/6 at the upper right sternal border and apex. No gallop. No S3 or S4 sounds.   Pulmonary:     Effort: Pulmonary effort is normal.     Breath sounds: Normal breath sounds.  Abdominal:     General: Bowel sounds are normal.     Palpations: Abdomen is soft.  Musculoskeletal:        General: No swelling.  Skin:    Capillary Refill: Capillary refill takes less than 2 seconds.  Neurological:     General: No focal deficit present.     Mental Status: She is oriented to person, place, and time.  Psychiatric:        Mood and Affect: Mood normal.    Radiology: No results found.  Laboratory examination:   CMP Latest Ref Rng & Units 03/25/2021 09/12/2019 07/05/2019  Glucose 65 - 99 mg/dL 173(H) 128(H) 116(H)  BUN 8 - 27 mg/dL 29(H) 16 25  Creatinine 0.57 - 1.00 mg/dL 1.40(H) 1.49(H) 1.53(H)  Sodium 134 - 144 mmol/L 137 143 142  Potassium 3.5 - 5.2 mmol/L 5.0 3.5 4.8  Chloride 96 - 106 mmol/L 98 102 104  CO2 20 - 29 mmol/L _0 Calcium 8.7 - 10.3 mg/dL 9.6 9.1 -  Total Protein 6.0 - 8.5 g/dL 6.5 6.9 -  Total Bilirubin 0.0 - 1.2 mg/dL 0.5 0.5 -  Alkaline Phos 44 - 121 IU/L 91 60 -  AST 0 - 40 IU/L 22 24 -  ALT 0 - 32 IU/L 22 23 -   CBC Latest Ref Rng & Units 03/25/2021 06/20/2019 08/16/2018  WBC 3.4 - 10.8 x10E3/uL 5.8 5.4 4.7  Hemoglobin 11.1 - 15.9 g/dL 11.3 12.0 10.7(L)  Hematocrit 34.0 - 46.6 % 34.6 36.1 32.9(L)  Platelets 150 - 450 x10E3/uL 134(L) 130(L) 139(L)   Lipid Panel Recent Labs    03/25/21 1419  CHOL 102  TRIG 104  LDLCALC 41  HDL 41     External  labs:    A1C 9.100 % 09/28/2020  Creatinine, Serum 1.610 MG/ 09/28/2020  A1C 13.000 % 02/08/2020 TSH 2.420 02/08/2020  Creatinine, Serum 1.420 MG/ 02/08/2020 ALT (SGPT) 18.000 IU/ 02/08/2020  Cholesterol, total 116.000 09/12/2019 HDL 50.000 09/12/2019 LDL  45 Triglycerides 109.000 09/12/2019  A1C 8.800 % 11/16/2019; TSH 6.340 11/16/2019  Hemoglobin 12.000 G/ 06/20/2019; Platelets 130.000 06/20/2019  Creatinine, Serum 1.420 MG/ 02/08/2020 Potassium 3.500 09/12/2019 ALT (SGPT) 18.000 IU/ 02/08/2020  Labs 08/16/2018: Serum glucose 169 mg, BUN 9, creatinine 0.93, eGFR 56 mL, HB 10.7/HCT 32.9, platelets 139.  TSH markedly reduced at 0.012.  Total cholesterol 120, triglycerides 130, HDL 32, LDL 62.  Allergies  Allergen Reactions  . Codeine Nausea And Vomiting  . Hydrocodone-Acetaminophen Nausea And Vomiting    Medications   Current Outpatient Medications on File Prior to Visit  Medication Sig Dispense Refill  . acetaminophen (TYLENOL) 500 MG tablet Take 500 mg by mouth every 6 (six) hours as needed for moderate pain or headache.    Marland Kitchen amiodarone (PACERONE) 200 MG tablet TAKE 1/2 TABLET(100 MG) BY MOUTH DAILY 90 tablet 1  . b complex vitamins tablet Take 1 tablet by mouth daily.    . Calcium Carb-Cholecalciferol (CALCIUM 600-D PO) Take 600 mg by mouth daily.    Marland Kitchen DILT-XR 180 MG 24 hr capsule TAKE 1 CAPSULE(180 MG) BY MOUTH DAILY 90 capsule 3  . hydrochlorothiazide (MICROZIDE) 12.5 MG capsule Take 12.5 mg by mouth daily.    Marland Kitchen LANTUS SOLOSTAR 100 UNIT/ML Solostar Pen     . levocetirizine (XYZAL) 5 MG tablet Take 5 mg by mouth every evening.    Marland Kitchen levothyroxine (SYNTHROID, LEVOTHROID) 100 MCG tablet Take 1 tablet (100 mcg total) by mouth daily before breakfast. 30 tablet 3  . meclizine (ANTIVERT) 25 MG tablet Take 25 mg by mouth 4 (four) times daily as needed for dizziness.    . metoprolol succinate (TOPROL-XL) 25 MG 24 hr tablet Take 1 tablet (25 mg total) by mouth daily. Take with or  immediately following a meal. 30 tablet 2  . ONETOUCH ULTRA test strip     . rosuvastatin (CRESTOR) 20 MG tablet Take 1 tablet (20 mg total) by mouth daily. 30 tablet 2  . TRADJENTA 5 MG TABS tablet Take 5 mg by mouth daily.     No current facility-administered medications on file prior to visit.    Cardiac Studies:   Coronary angiogram  06/24/2016 Mild pulmonary hypertension suggestive of primary pulmonary hypertension, mean PA pressure of 26 mmHg.  proximal LAD revealing a 50% to 60% stenosis and calcified, No significant change from 04/20/2015.  Mild aortic stenosis with a 12 mm pressure gradient across the aortic valve.  Lexiscan myoview stress test 04/26/2018:  1. Lexiscan stress test performed. Exercise capacity not assessed. Stress symptoms included dizziness and chest pain/. Normal blood pressure. The resting electrocardiogram demonstrated atrial fibrillation with rapid ventricular rate, normal resting conduction and nonspecific ST-T changes.  Stress EKG is non diagnostic for ischemia as it is a pharmacologic stress.  2. The overall quality of the study is excellent. There is no evidence of abnormal lung activity. Stress and rest SPECT images demonstrate homogeneous tracer distribution throughout the myocardium. Gated SPECT imaging reveals normal myocardial thickening and wall motion. The left ventricular ejection fraction was normal (65%).   3. Low risk study.   Echocardiogram 03/28/2020: Left ventricle cavity is normal in size. Mild concentric hypertrophy of the left ventricle. Normal global wall motion. Normal LV systolic function with EF 55%. Unable to evaluate diastolic function due to atrial fibrillation. Left atrial cavity is moderately dilated. Trileaflet aortic valve with moderate aortic valve leaflet calcification. At least moderate aortic stenosis. Aortic valve mean gradient of 15 mmHg, Vmax of 2.6  m/s. Calculated aortic valve area by continuity equation is 0.8 cm. Dimensionless  index  of 0.23. Significant discrepancy between AVA and mean PG, Vmax. Consider alternate noninvasive/ invasive modality for aortic stenosis assessment, as clinically indicated.  Moderate (Grade II) aortic regurgitation. Moderate (Grade III) mitral regurgitation. Moderate tricuspid regurgitation. Estimated pulmonary artery systolic pressure is 27 mmHg. Mild pulmonic regurgitation. Compared to previous study in 2018, no signifciant changes noted in aortic valve. PASP reduced from 46 mmHg then to 27 mmHg now.   Nocturnal oximetry 10/04/2020: SPO2 <88% was 5.4 minutes and <89% was 9.3 minutes, consecutive <88% 1 minute.  Awake SPO2 92%. Oxygen desaturation events 24, index 30. Lowest SPO2 80%, highest SpO2 99%.  Impression: Very mild abnormality with SPO2 less than 88% for 5 minutes.  Awake SPO2 greater than or equal to 92%.  Although patient qualifies for nocturnal oxygen supplementation, not convinced it would be helpful.  EKG:   EKG 03/25/2021: Atrial fibrillation with rapid ventricular response at rate of 108 bpm, normal axis, poor R wave progression, cannot exclude anteroseptal infarct old.  Low-voltage complexes -possible pulmonary disease.  No significant change from 03/23/2020.     Assessment     ICD-10-CM   1. Permanent atrial fibrillation (HCC)  I48.21 CBC    Fecal Occult Blood, Guaiac    Rivaroxaban (XARELTO) 15 MG TABS tablet    CANCELED: EKG 12-Lead  2. Coronary artery disease involving native coronary artery of native heart without angina pectoris  I25.10   3. Bloody stool  K92.1 Fecal Occult Blood, Guaiac  4. Primary hypertension  I10     CHA2DS2-VASc Score is 6.  Yearly risk of stroke: 9.8% (F, A, HTN, DM, Vasc Dz).  Score of 1=0.6; 2=2.2; 3=3.2; 4=4.8; 5=7.2; 6=9.8; 7=>9.8) -(CHF; HTN; vasc disease DM,  Female = 1; Age <65 =0; 65-74 = 1,  >75 =2; stroke/embolism= 2).    Meds ordered this encounter  Medications  . Rivaroxaban (XARELTO) 15 MG TABS tablet    Sig: Take 1  tablet (15 mg total) by mouth daily with supper. Hold as of 05/28/2021 until stool exam and blood count is evaluated in the lab    Dispense:  90 tablet    Refill:  1    Medications Discontinued During This Encounter  Medication Reason  . triamcinolone cream (KENALOG) 0.1 % Error  . triamcinolone cream (KENALOG) 0.1 % Error  . mupirocin ointment (BACTROBAN) 2 % Error  . permethrin (ELIMITE) 5 % cream Error  . hydrocortisone cream 1 % Error  . clobetasol cream (TEMOVATE) 0.05 % Error  . betamethasone dipropionate 0.05 % lotion Error  . XARELTO 15 MG TABS tablet     Orders Placed This Encounter  Procedures  . CBC  . Fecal Occult Blood, Guaiac    Current Outpatient Medications  Medication Instructions  . acetaminophen (TYLENOL) 500 mg, Oral, Every 6 hours PRN  . amiodarone (PACERONE) 200 MG tablet TAKE 1/2 TABLET(100 MG) BY MOUTH DAILY  . b complex vitamins tablet 1 tablet, Oral, Daily  . Calcium Carb-Cholecalciferol (CALCIUM 600-D PO) 600 mg, Oral, Daily  . DILT-XR 180 MG 24 hr capsule TAKE 1 CAPSULE(180 MG) BY MOUTH DAILY  . hydrochlorothiazide (MICROZIDE) 12.5 mg, Oral, Daily  . LANTUS SOLOSTAR 100 UNIT/ML Solostar Pen No dose, route, or frequency recorded.  Marland Kitchen levocetirizine (XYZAL) 5 mg, Oral, Every evening  . levothyroxine (SYNTHROID) 100 mcg, Oral, Daily before breakfast  . meclizine (ANTIVERT) 25 mg, Oral, 4 times daily PRN  . metoprolol succinate (TOPROL-XL) 25 mg, Oral, Daily, Take with or immediately following a meal.  .  ONETOUCH ULTRA test strip No dose, route, or frequency recorded.  . Rivaroxaban (XARELTO) 15 mg, Oral, Daily with supper, Hold as of 05/28/2021 until stool exam and blood count is evaluated in the lab  . rosuvastatin (CRESTOR) 20 mg, Oral, Daily  . Tradjenta 5 mg, Oral, Daily    Recommendations:   [name]  is a 87 y.o. female Caucasian patient with rare form of low-grade lung carcinoid, permanent atrial fibrillation, moderate diffuse coronary  artery disease, asymptomatic right subclavian artery stenosis, hypertension, type II diabetes mellitus, hyperlipidemia.   This is a 6-week office visit.  No significant change in chronic dyspnea.  No leg edema, no palpitations.  No chest pain.  She is tolerating anticoagulation with Xarelto and also amiodarone for atrial fibrillation without any side effects. Without amiodarone her heart rate control was extremely difficult.  Her last office visit I had started her on metoprolol succinate 25 mg daily for heart rate control which she is tolerating.  Her main complaint for the past 2 weeks has been bloody stools and also dark stools.  She has not had any dizziness or syncope.  I will discontinue Xarelto for now, will obtain CBC and Hemoccult stool and if positive for bleeding, may need GI evaluation.  Fortunately no clinical evidence of heart failure, no dizziness or syncope, blood pressure has been well controlled.  She is presently not taking Tradjenta, states that it makes her feel tired and dizzy.  Previously she had not tolerated any of the diabetic medications as well. She is compliant with insulin.   Adrian Prows, MD, [facility] 05/29/2021, 3:14 AM Office: [phone]

## 2021-05-29 ENCOUNTER — Other Ambulatory Visit: Payer: Self-pay | Admitting: Cardiology

## 2021-05-29 DIAGNOSIS — K921 Melena: Secondary | ICD-10-CM

## 2021-05-29 LAB — CBC
Hematocrit: 27.7 % — ABNORMAL LOW (ref 34.0–46.6)
Hemoglobin: 8.9 g/dL — ABNORMAL LOW (ref 11.1–15.9)
MCH: 27.8 pg (ref 26.6–33.0)
MCHC: 32.1 g/dL (ref 31.5–35.7)
MCV: 87 fL (ref 79–97)
Platelets: 149 10*3/uL — ABNORMAL LOW (ref 150–450)
RBC: 3.2 x10E6/uL — ABNORMAL LOW (ref 3.77–5.28)
RDW: 15.8 % — ABNORMAL HIGH (ref 11.7–15.4)
WBC: 4.8 10*3/uL (ref 3.4–10.8)

## 2021-05-29 MED ORDER — RIVAROXABAN 15 MG PO TABS
15.0000 mg | ORAL_TABLET | Freq: Every day | ORAL | 1 refills | Status: DC
Start: 2021-05-29 — End: 2021-09-12

## 2021-05-30 ENCOUNTER — Other Ambulatory Visit: Payer: Self-pay | Admitting: Cardiology

## 2021-05-30 ENCOUNTER — Telehealth: Payer: Self-pay

## 2021-05-30 ENCOUNTER — Other Ambulatory Visit: Payer: Self-pay

## 2021-05-30 NOTE — Progress Notes (Addendum)
Recent drop in Hb due to active GI bleed. Xarelto discontinued     ICD-10-CM   1. Bloody stool  K92.1 Ambulatory referral to Gastroenterology   Orders Placed This Encounter  Procedures  . CBC  . Ambulatory referral to Gastroenterology    Referral Priority:   Routine    Referral Type:   Consultation    Referral Reason:   Specialty Services Required    Number of Visits Requested:   [place], MD, [facility] 05/30/2021, 6:39 PM Office: [phone] Fax: [phone] Pager: [phone]

## 2021-05-30 NOTE — Addendum Note (Signed)
Addended by: Kela Millin on: 05/30/2021 06:39 PM   Modules accepted: Orders

## 2021-05-30 NOTE — Telephone Encounter (Signed)
Please inform her that her hemoglobin has dropped, I have made a referral for gastroenterologist to see her, patient referred to the [facility] and they should be contacting her soon.  If they do not call her to call us back.  Please also let the front staff know to call their office and let them know that consult is ASAP.

## 2021-05-30 NOTE — Telephone Encounter (Signed)
Patient called and asked if you wanted her to stay on Xarelto, she could not remember if you told her stop taking it or to continue it. Please advise.

## 2021-05-31 ENCOUNTER — Telehealth: Payer: Self-pay

## 2021-05-31 NOTE — Telephone Encounter (Signed)
     Called patient, NA, no VM box to leave a message. Called spouse, number disconnected.

## 2021-06-03 LAB — FECAL OCCULT BLOOD, IMMUNOCHEMICAL: Fecal Occult Bld: POSITIVE — AB

## 2021-06-03 NOTE — Telephone Encounter (Signed)
Called and spoke with patient regarding her lab results.   [facility] desk staff : Please [facility] about scheduling an appointment for patient to be seen ASAP.  Please and thank you.

## 2021-06-05 ENCOUNTER — Other Ambulatory Visit: Payer: Self-pay | Admitting: Gastroenterology

## 2021-06-05 DIAGNOSIS — R634 Abnormal weight loss: Secondary | ICD-10-CM

## 2021-06-10 NOTE — Progress Notes (Signed)
Attempted to obtain medical history via telephone, unable to reach at this time.

## 2021-06-12 NOTE — Anesthesia Preprocedure Evaluation (Addendum)
Anesthesia Evaluation  Patient identified by MRN, date of birth, ID band Patient awake    Reviewed: Allergy & Precautions, NPO status , Patient's Chart, lab work & pertinent test results  Airway Mallampati: II  TM Distance: >3 FB Neck ROM: Full    Dental no notable dental hx. (+) Dental Advisory Given, Partial Upper   Pulmonary shortness of breath,    Pulmonary exam normal breath sounds clear to auscultation       Cardiovascular hypertension, Pt. on medications + CAD  Normal cardiovascular exam+ dysrhythmias Atrial Fibrillation  Rhythm:Regular Rate:Normal     Neuro/Psych negative neurological ROS     GI/Hepatic Neg liver ROS, Low grade carcinoid   Endo/Other  diabetes, Type 2Hypothyroidism   Renal/GU Renal disease     Musculoskeletal   Abdominal   Peds  Hematology  (+) anemia ,   Anesthesia Other Findings   Reproductive/Obstetrics                            Anesthesia Physical Anesthesia Plan  ASA: 3  Anesthesia Plan: MAC   Post-op Pain Management:    Induction:   PONV Risk Score and Plan: Treatment may vary due to age or medical condition  Airway Management Planned: Natural Airway  Additional Equipment: None  Intra-op Plan:   Post-operative Plan:   Informed Consent: I have reviewed the patients History and Physical, chart, labs and discussed the procedure including the risks, benefits and alternatives for the proposed anesthesia with the patient or authorized representative who has indicated his/her understanding and acceptance.     Dental advisory given  Plan Discussed with: CRNA and Anesthesiologist  Anesthesia Plan Comments: (EGD colon for Melena)       Anesthesia Quick Evaluation

## 2021-06-12 NOTE — H&P (Signed)
HPI:   87 year old female,history of A. fib, CAD, HTN, type 2 diabetes, hyperlipidemia, low-grade lung carcinoid, presents for evaluation of GI bleed.  Patient was referred here by cardiologist Dr. Einar Gip for GI bleed.  Recent CBC 05/29/21 showed microcytic anemia with hemoglobin of 8.9. Xarelto was discontinued.        CBC in March 2022 showed hemoglobin of 11.3.        she has been having 3 weeks of bloody stools and dark-colored stools. she states she was having strings of blood on the tissue paper and in the toilet as well as streaks of bright red blood in her stool. Also having dark stools almost black. States that she has not had bright red blood in her stool since last Tuesday after discontinuing Xarelto. However, she continues to have dark/black stools. Denies dizziness, denies weakness. Stools are normal, soft, and formed. denies diarrhea/constipation        Also endorses unintentional weight loss of 14 pounds over the period of 8 months. States nothing has changed and she eats normal but she has gone from 136 pounds 122 pounds.        patient also endorses heartburn that occurs almost daily. In endorses having to wake up in the middle the night choking on phlegm.        colonoscopy in 2017 showed benign colonic mucosa with focal lymphoid aggregate. No repeat due to age.        EGD 2017 showed mild gastritis        history of diverticulitis March 2017. CT abdomen and pelvis showed diverticulitis.        denies nausea/vomiting        Denies dysphagia        Denies abdominal pain        Denies loss of appetite.      ROS:      GI PROCEDURE:          Pacemaker/ AICD no.  Artificial heart valves no.  MI/heart attack no.  Abnormal heart rhythm YES, palpations .  Angina no.  CVA no.  Hypertension YES.  Hypotension no.  Asthma, COPD YES.  Sleep apnea no.  Seizure disorders no.  Artificial joints no.  Severe DJD YES.  Diabetes YES, type II.  Significant headaches YES, migraines .  Vertigo no.   Depression/anxiety no.  Abnormal bleeding no.  Kidney Disease no.  Liver disease no.  Chance of pregnancy no.  Blood transfusion no.            Medical History:  Hypothyroidism, Hypertension, Diabetes mellitus, Reflux, Esophagitis, Hemorrhoids, internal, Diverticulosis, Atrial Fibrillation.   Surgical History: EGD 09/22/2002,03/16/2009,06/2014, colonoscopy 09/22/2002,03/16/2009,06/2014, gall bladder removed perhaps 40 years ago , cataracts removed , R. side jaw implant , R. wrist surgery , Hemorrhoids removed X2 , Left breast nodules removed .      Hospitalization/Major Diagnostic Procedure: not in past yr 05/2021.       Family History:  Brother 1: deceased.  Sister 1: deceased.  Son(s): middle son - colon polyps.  Father: diagnosed with Coronary artery disease.  Mother: diagnosed with Breast cancer.  2 brother(s) , 4 sister(s) . 3 son(s) . .   neg family hx for liver Dz and colon cancer.      Social History:      General: Tobacco use  cigarettes:  Never smoked, Tobacco history last updated  06/05/2021. no Alcohol. no Recreational drug use.       Medications: Taking  Amiodarone HCl  200 MG Tablet 1/2 tablet Orally Once a day Calcium 600 MG Tablet 1 tablet with meals Orally Once a day DilTIAZem HCl ER 180 MG Capsule Extended Release 24 Hour 1 capsule Orally Once a day, Hydrochlorothiazide-12.5 mg 12.5 mg Tablet one tablet Orally once a day, Rosuvastatin Calcium 20 MG Tablet 1 tablet Orally Once a day Synthroid(L-Thyroxine Sodium) 100 MCG Tablet 1 tablet Orally Once a day, Vitamin B Complex - Tablet as directed Orally  Vitamin D3 20 MCG (800 UNIT) Tablet 1 tablet Orally Once a day,  Medication List reviewed and reconciled with the patient      Allergies: Codeine (for allergy): unknown - Allergy, Norco: unknown - Allergy.      Objective:   Vitals: Wt 122.4, Wt change -6.6 lb, Ht 63, BMI 21.68, Temp 97.5, Pulse sitting 115, BP sitting 127/66.      Examination:      Gastroenterology  Exam:         GENERAL APPEARANCE: Well developed, well nourished, no active distress, pleasant, no acute distress . EYES: Lids and conjunctiva normal. Sclera normal, pupils equal and reactive. SCLERA: anicteric. RESPIRATORY Breath sounds normal. Respiration even and unlabored. CARDIOVASCULAR irregularly irregular with normal rhythm and systolic grade 3 murmur. No peripheral edema. ABDOMEN No masses palpated. Liver and spleen not palpated, normal. Bowel sounds normal, Abdomen not distended. RECTAL: normal sphincter tone. No evidence of external or internal hemorrhoids., flecks of brown stool that were heme negative. EXTREMITIES: No edema, pulses intact. SKIN Warm and dry. PSYCHIATRIC Alert and oriented x3, mood and affect appear normal..        Assessment:      Assessment:   1. Melena - K92.1 (Primary)   2. Anemia, unspecified type - D64.9   3. Blood in stool, frank - K92.1   4. Gastroesophageal reflux disease, unspecified whether esophagitis present - K21.9   5. Weight loss - R63.4     Attendance to Dr. Michail Sermon in the office, available for questions.   Plan:       1. Melena  Start Pantoprazole Sodium Tablet Delayed Release, 40 MG, 1 tablet, Orally, twice a day, 90 days, 180 Tablet, Refills 1 .          LAB: CBC with Diff       LAB: Comp Metabolic Panel       LAB: Ferritin       LAB: Iron Panel       Imaging: Colon/EGD               Clinical Notes: 2.5 week hx of melena. HGB 8.9 05/29/21. Cardiologist discontinued xarelto. Stool was brown and heme negative on DRE, however, patient insists stools have been black. Reccomend EGD/Colon for further evaluation of melena with anemia. Recommend it be done at hospital with comorbities. PPI twice daily.  The procedure EGD was thoroughly explained to the patient, including the risks, benefits and alternatives. We discussed the complications including Bleeding and perforation, sedation problems and missing something. The patient verbalized  understanding and agree. recommend  colonoscopy. I thoroughly discussed the procedure with the patient including but not limited to nature, alternatives, benefits, and risks (including but not limited to bleeding, infection, perforation, anesthesia/cardiopulmonary complications). All questions were answered and the patient acknowledges these risks and wishes to proceed with colonoscopy.       2. Anemia, unspecified type  Clinical Notes: microcytic anemia, recent CBC hemoglobin of 8.9. CBC prior to that showed hemoglobin of 11.3. Anemia due to blood loss with  the patient complaining of black stools. Recommended EGD/colon to further evaluate source of bleeding. Recommend pantoprazole twice daily.  Strict ED precautions given.       3. Blood in stool, frank  Clinical Notes: One-week history. Has not seen since last Tuesday. Was possibly due to hemorrhoids. Colonoscopy for melena to further evaluate and also evaluate for frank blood.       4. Gastroesophageal reflux disease, unspecified whether esophagitis present  Clinical Notes: PPI. Educated patient on lifestyle modifications.       5. Weight loss        Imaging: CT ABD-PELV W/O CM Clinical Notes: 136 pounds 122 pounds over 8 month period. Recommend CT abdomen and pelvis to further evaluate. Will have to be without contrast due to GFR of 37 on last CMP reviewed through Epic.

## 2021-06-13 ENCOUNTER — Ambulatory Visit (HOSPITAL_COMMUNITY): Payer: Medicare Other | Admitting: Anesthesiology

## 2021-06-13 ENCOUNTER — Other Ambulatory Visit: Payer: Self-pay

## 2021-06-13 ENCOUNTER — Encounter (HOSPITAL_COMMUNITY): Payer: Self-pay | Admitting: Gastroenterology

## 2021-06-13 ENCOUNTER — Ambulatory Visit (HOSPITAL_COMMUNITY)
Admission: RE | Admit: 2021-06-13 | Discharge: 2021-06-13 | Disposition: A | Discharge status: Home / Self Care | Payer: Medicare Other | Source: Ambulatory Visit | Attending: Gastroenterology | Admitting: Gastroenterology

## 2021-06-13 ENCOUNTER — Encounter (HOSPITAL_COMMUNITY)
Admission: RE | Disposition: A | Discharge status: Home / Self Care | Payer: Self-pay | Source: Ambulatory Visit | Attending: Gastroenterology

## 2021-06-13 DIAGNOSIS — K219 Gastro-esophageal reflux disease without esophagitis: Secondary | ICD-10-CM | POA: Diagnosis not present

## 2021-06-13 DIAGNOSIS — D122 Benign neoplasm of ascending colon: Secondary | ICD-10-CM | POA: Diagnosis not present

## 2021-06-13 DIAGNOSIS — I10 Essential (primary) hypertension: Secondary | ICD-10-CM | POA: Diagnosis not present

## 2021-06-13 DIAGNOSIS — E785 Hyperlipidemia, unspecified: Secondary | ICD-10-CM | POA: Diagnosis not present

## 2021-06-13 DIAGNOSIS — K648 Other hemorrhoids: Secondary | ICD-10-CM | POA: Diagnosis not present

## 2021-06-13 DIAGNOSIS — I4891 Unspecified atrial fibrillation: Secondary | ICD-10-CM | POA: Insufficient documentation

## 2021-06-13 DIAGNOSIS — Z79899 Other long term (current) drug therapy: Secondary | ICD-10-CM | POA: Insufficient documentation

## 2021-06-13 DIAGNOSIS — Z6821 Body mass index (BMI) 21.0-21.9, adult: Secondary | ICD-10-CM | POA: Insufficient documentation

## 2021-06-13 DIAGNOSIS — K5730 Diverticulosis of large intestine without perforation or abscess without bleeding: Secondary | ICD-10-CM | POA: Diagnosis not present

## 2021-06-13 DIAGNOSIS — K3189 Other diseases of stomach and duodenum: Secondary | ICD-10-CM | POA: Insufficient documentation

## 2021-06-13 DIAGNOSIS — C7A090 Malignant carcinoid tumor of the bronchus and lung: Secondary | ICD-10-CM | POA: Diagnosis not present

## 2021-06-13 DIAGNOSIS — Z7901 Long term (current) use of anticoagulants: Secondary | ICD-10-CM | POA: Insufficient documentation

## 2021-06-13 DIAGNOSIS — Z79890 Hormone replacement therapy (postmenopausal): Secondary | ICD-10-CM | POA: Insufficient documentation

## 2021-06-13 DIAGNOSIS — K921 Melena: Secondary | ICD-10-CM | POA: Diagnosis present

## 2021-06-13 DIAGNOSIS — Z885 Allergy status to narcotic agent status: Secondary | ICD-10-CM | POA: Insufficient documentation

## 2021-06-13 DIAGNOSIS — R634 Abnormal weight loss: Secondary | ICD-10-CM | POA: Insufficient documentation

## 2021-06-13 DIAGNOSIS — D500 Iron deficiency anemia secondary to blood loss (chronic): Secondary | ICD-10-CM | POA: Insufficient documentation

## 2021-06-13 DIAGNOSIS — I2510 Atherosclerotic heart disease of native coronary artery without angina pectoris: Secondary | ICD-10-CM | POA: Diagnosis not present

## 2021-06-13 DIAGNOSIS — E119 Type 2 diabetes mellitus without complications: Secondary | ICD-10-CM | POA: Diagnosis not present

## 2021-06-13 HISTORY — PX: COLONOSCOPY WITH PROPOFOL: SHX5780

## 2021-06-13 HISTORY — PX: ESOPHAGOGASTRODUODENOSCOPY (EGD) WITH PROPOFOL: SHX5813

## 2021-06-13 HISTORY — PX: BIOPSY: SHX5522

## 2021-06-13 LAB — GLUCOSE, CAPILLARY: Glucose-Capillary: 129 mg/dL — ABNORMAL HIGH (ref 70–99)

## 2021-06-13 SURGERY — ESOPHAGOGASTRODUODENOSCOPY (EGD) WITH PROPOFOL
Anesthesia: Monitor Anesthesia Care

## 2021-06-13 MED ORDER — PROPOFOL 500 MG/50ML IV EMUL
INTRAVENOUS | Status: DC | PRN
  Administered 2021-06-13: 150 ug/kg/min via INTRAVENOUS

## 2021-06-13 MED ORDER — LACTATED RINGERS IV SOLN: INTRAVENOUS | Status: DC | PRN

## 2021-06-13 MED ORDER — LIDOCAINE 2% (20 MG/ML) 5 ML SYRINGE
INTRAMUSCULAR | Status: DC | PRN
  Administered 2021-06-13: 60 mg via INTRAVENOUS

## 2021-06-13 MED ORDER — LACTATED RINGERS IV SOLN
INTRAVENOUS | Status: DC
  Administered 2021-06-13: 1000 mL via INTRAVENOUS

## 2021-06-13 MED ORDER — PROPOFOL 10 MG/ML IV BOLUS
INTRAVENOUS | Status: DC | PRN
  Administered 2021-06-13 (×2): 10 mg via INTRAVENOUS

## 2021-06-13 MED ORDER — SODIUM CHLORIDE 0.9 % IV SOLN: INTRAVENOUS | Status: DC

## 2021-06-13 SURGICAL SUPPLY — 25 items

## 2021-06-13 NOTE — Transfer of Care (Signed)
Immediate Anesthesia Transfer of Care Note  Patient: [name]  Procedure(s) Performed: ESOPHAGOGASTRODUODENOSCOPY (EGD) WITH PROPOFOL COLONOSCOPY WITH PROPOFOL BIOPSY  Patient Location: PACU and Endoscopy Unit  Anesthesia Type:MAC  Level of Consciousness: awake, alert  and oriented  Airway & Oxygen Therapy: Patient Spontanous Breathing and Patient connected to face mask  Post-op Assessment: Report given to RN and Post -op Vital signs reviewed and stable  Post vital signs: Reviewed and stable  Last Vitals:  Vitals Value Taken Time  BP 121/64 06/13/21 1410  Temp    Pulse 99 06/13/21 1410  Resp 19 06/13/21 1410  SpO2 100 % 06/13/21 1410  Vitals shown include unvalidated device data.  Last Pain:  Vitals:   06/13/21 1314  TempSrc: Oral  PainSc: 0-No pain         Complications: No notable events documented.

## 2021-06-13 NOTE — Op Note (Signed)
[facility] Patient Name: [name] Procedure Date: 06/13/2021 MRN: [id number] Attending MD: Ronnette Juniper , MD Date of Birth: 01/13/1934 CSN: [id number] Age: 87 Admit Type: Outpatient Procedure:                Upper GI endoscopy Indications:              Iron deficiency anemia secondary to chronic blood                            loss, Melena Providers:                Ronnette Juniper, MD, Nelia Shi, RN, Fransico Setters                            Mbumina, Technician Referring MD:             Tamsen Roers, MD, Dr.Ganji-Cardiology Medicines:                Monitored Anesthesia Care Complications:            No immediate complications. Estimated Blood Loss:     Estimated blood loss: none. Procedure:                Pre-Anesthesia Assessment:                           - Prior to the procedure, a History and Physical                            was performed, and patient medications and                            allergies were reviewed. The patient's tolerance of                            previous anesthesia was also reviewed. The risks                            and benefits of the procedure and the sedation                            options and risks were discussed with the patient.                            All questions were answered, and informed consent                            was obtained. Prior Anticoagulants: The patient has                            taken no previous anticoagulant or antiplatelet                            agents. ASA Grade Assessment: III - A patient with  severe systemic disease. After reviewing the risks                            and benefits, the patient was deemed in                            satisfactory condition to undergo the procedure.                           After obtaining informed consent, the endoscope was                            passed under direct vision. Throughout the                             procedure, the patient's blood pressure, pulse, and                            oxygen saturations were monitored continuously. The                            [id number] Olympus ([id number]-[id number]) was introduced through                            the mouth, and advanced to the second part of                            duodenum. The upper GI endoscopy was accomplished                            without difficulty. The patient tolerated the                            procedure well. Scope In: Scope Out: 1:43:56 PM Findings:      The examined esophagus was normal.      The Z-line was regular.      Diffuse mildly erythematous mucosa without bleeding was found in the       gastric fundus, in the gastric body and in the gastric antrum.      The cardia and gastric fundus were normal on retroflexion.      The examined duodenum was normal.      No evidence of active or recent bleeding. Impression:               - Normal esophagus.                           - Z-line regular.                           - Erythematous mucosa in the gastric fundus,                            gastric body and antrum.                           -  Normal examined duodenum.                           - No specimens collected. Moderate Sedation:      Patient did not receive moderate sedation for this procedure, but       instead received monitored anesthesia care. Recommendation:           - Patient has a contact number available for                            emergencies. The signs and symptoms of potential                            delayed complications were discussed with the                            patient. Return to normal activities tomorrow.                            Written discharge instructions were provided to the                            patient.                           - Resume regular diet.                           - Continue present medications.                           - Perform a colonoscopy  today. Procedure Code(s):        --- Professional ---                           [phone], Esophagogastroduodenoscopy, flexible,                            transoral; diagnostic, including collection of                            specimen(s) by brushing or washing, when performed                            (separate procedure) Diagnosis Code(s):        --- Professional ---                           K31.89, Other diseases of stomach and duodenum                           D50.0, Iron deficiency anemia secondary to blood                            loss (chronic)  K92.1, Melena (includes Hematochezia) CPT copyright 2019 American [employer]. All rights reserved. The codes documented in this report are preliminary and upon coder review may  be revised to meet current compliance requirements. Ronnette Juniper, MD 06/13/2021 2:07:26 PM This report has been signed electronically. Number of Addenda: 0

## 2021-06-13 NOTE — Op Note (Signed)
[facility] Patient Name: [name] Procedure Date: 06/13/2021 MRN: [id number] Attending MD: Ronnette Juniper , MD Date of Birth: December 12, 1934 CSN: [id number] Age: 87 Admit Type: Outpatient Procedure:                Colonoscopy Indications:              Last colonoscopy: 2015, Hematochezia Providers:                Ronnette Juniper, MD, Nelia Shi, RN, Janee Morn, Technician Referring MD:             Tamsen Roers, MD, Dr.Ganji-Cardiology Medicines:                Monitored Anesthesia Care Complications:            No immediate complications. Estimated Blood Loss:     Estimated blood loss was minimal. Procedure:                Pre-Anesthesia Assessment:                           - Prior to the procedure, a History and Physical                            was performed, and patient medications and                            allergies were reviewed. The patient's tolerance of                            previous anesthesia was also reviewed. The risks                            and benefits of the procedure and the sedation                            options and risks were discussed with the patient.                            All questions were answered, and informed consent                            was obtained. Prior Anticoagulants: The patient has                            taken no previous anticoagulant or antiplatelet                            agents. ASA Grade Assessment: III - A patient with                            severe systemic disease. After reviewing the risks  and benefits, the patient was deemed in                            satisfactory condition to undergo the procedure.                           - Prior to the procedure, a History and Physical                            was performed, and patient medications and                            allergies were reviewed. The patient's tolerance of                             previous anesthesia was also reviewed. The risks                            and benefits of the procedure and the sedation                            options and risks were discussed with the patient.                            All questions were answered, and informed consent                            was obtained. Prior Anticoagulants: The patient has                            taken no previous anticoagulant or antiplatelet                            agents. ASA Grade Assessment: III - A patient with                            severe systemic disease. After reviewing the risks                            and benefits, the patient was deemed in                            satisfactory condition to undergo the procedure.                           After obtaining informed consent, the colonoscope                            was passed under direct vision. Throughout the                            procedure, the patient's blood pressure, pulse, and  oxygen saturations were monitored continuously. The                            [id number]-H190DL ([id number] Olympus pediatric colonscope                            was introduced through the anus and advanced to the                            the terminal ileum. The colonoscopy was performed                            without difficulty. The patient tolerated the                            procedure well. The quality of the bowel                            preparation was good. Scope In: 1:45:15 PM Scope Out: 2:01:50 PM Scope Withdrawal Time: 0 hours 10 minutes 37 seconds  Total Procedure Duration: 0 hours 16 minutes 35 seconds  Findings:      The perianal and digital rectal examinations were normal.      The terminal ileum appeared normal.      Two sessile polyps were found in the ascending colon. The polyps were 4       to 5 mm in size. These polyps were removed with a piecemeal technique       using  a cold biopsy forceps. Resection and retrieval were complete.      Multiple small and large-mouthed diverticula were found in the sigmoid       colon, descending colon and transverse colon.      Non-bleeding internal hemorrhoids were found during retroflexion.      Small clots were noted in some areas of diverticulosis, which could be       washed with no associated active bleeding noted. Impression:               - The examined portion of the ileum was normal.                           - Two 4 to 5 mm polyps in the ascending colon,                            removed piecemeal using a cold biopsy forceps.                            Resected and retrieved.                           - Diverticulosis in the sigmoid colon, in the                            descending colon and in the transverse colon.                           -  Non-bleeding internal hemorrhoids. Moderate Sedation:      Patient did not receive moderate sedation for this procedure, but       instead received monitored anesthesia care. Recommendation:           - Patient has a contact number available for                            emergencies. The signs and symptoms of potential                            delayed complications were discussed with the                            patient. Return to normal activities tomorrow.                            Written discharge instructions were provided to the                            patient.                           - High fiber diet, bulk forming agents like                            metamucil/benefiber.                           - Continue present medications.                           - Await pathology results.                           - Repeat colonoscopy is not recommended. Procedure Code(s):        --- Professional ---                           [phone], Colonoscopy, flexible; with biopsy, single                            or multiple Diagnosis Code(s):        ---  Professional ---                           K64.8, Other hemorrhoids                           K63.5, Polyp of colon                           K92.1, Melena (includes Hematochezia)                           K57.30, Diverticulosis of large intestine without  perforation or abscess without bleeding CPT copyright 2019 American Medical Association. All rights reserved. The codes documented in this report are preliminary and upon coder review may  be revised to meet current compliance requirements. Ronnette Juniper, MD 06/13/2021 2:12:04 PM This report has been signed electronically. Number of Addenda: 0

## 2021-06-13 NOTE — Discharge Instructions (Addendum)
YOU HAD AN ENDOSCOPIC PROCEDURE TODAY: Refer to the procedure report and other information in the discharge instructions given to you for any specific questions about what was found during the examination. If this information does not answer your questions, please call [facility] office at [phone] to clarify.   YOU SHOULD EXPECT: Some feelings of bloating in the abdomen. Passage of more gas than usual. Walking can help get rid of the air that was put into your GI tract during the procedure and reduce the bloating. If you had a lower endoscopy (such as a colonoscopy or flexible sigmoidoscopy) you may notice spotting of blood in your stool or on the toilet paper. Some abdominal soreness may be present for a day or two, also.  DIET: Your first meal following the procedure should be a light meal and then it is ok to progress to your normal diet. A half-sandwich or bowl of soup is an example of a good first meal. Heavy or fried foods are harder to digest and may make you feel nauseous or bloated. Drink plenty of fluids but you should avoid alcoholic beverages for 24 hours. If you had a esophageal dilation, please see attached instructions for diet.    Take Benefiber, 1 tablespoon in 8 oz.of water, two times per day.  ACTIVITY: Your care partner should take you home directly after the procedure. You should plan to take it easy, moving slowly for the rest of the day. You can resume normal activity the day after the procedure however YOU SHOULD NOT DRIVE, use power tools, machinery or perform tasks that involve climbing or major physical exertion for 24 hours (because of the sedation medicines used during the test).   SYMPTOMS TO REPORT IMMEDIATELY: A gastroenterologist can be reached at any hour. Please call [phone]  for any of the following symptoms:  Following lower endoscopy (colonoscopy, flexible sigmoidoscopy) Excessive amounts of blood in the stool  Significant tenderness, worsening of abdominal  pains  Swelling of the abdomen that is new, acute  Fever of 100 or higher  Following upper endoscopy (EGD, EUS, ERCP, esophageal dilation) Vomiting of blood or coffee ground material  New, significant abdominal pain  New, significant chest pain or pain under the shoulder blades  Painful or persistently difficult swallowing  New shortness of breath  Black, tarry-looking or red, bloody stools  FOLLOW UP:  If any biopsies were taken you will be contacted by phone or by letter within the next 1-3 weeks. Call [phone]  if you have not heard about the biopsies in 3 weeks.  Please also call with any specific questions about appointments or follow up tests. YOU HAD AN ENDOSCOPIC PROCEDURE TODAY: Refer to the procedure report and other information in the discharge instructions given to you for any specific questions about what was found during the examination. If this information does not answer your questions, please call [facility] office at [phone] to clarify.

## 2021-06-14 ENCOUNTER — Encounter (HOSPITAL_COMMUNITY): Payer: Self-pay | Admitting: Gastroenterology

## 2021-06-14 LAB — SURGICAL PATHOLOGY

## 2021-06-14 NOTE — Anesthesia Postprocedure Evaluation (Signed)
Anesthesia Post Note  Patient: [name]  Procedure(s) Performed: ESOPHAGOGASTRODUODENOSCOPY (EGD) WITH PROPOFOL COLONOSCOPY WITH PROPOFOL BIOPSY     Patient location during evaluation: Endoscopy Anesthesia Type: MAC Level of consciousness: awake and alert Pain management: pain level controlled Vital Signs Assessment: post-procedure vital signs reviewed and stable Respiratory status: spontaneous breathing, nonlabored ventilation, respiratory function stable and patient connected to nasal cannula oxygen Cardiovascular status: blood pressure returned to baseline and stable Postop Assessment: no apparent nausea or vomiting Anesthetic complications: no   No notable events documented.  Last Vitals:  Vitals:   06/13/21 1430 06/13/21 1440  BP: 129/83 (!) 142/85  Pulse: (!) 104 (!) 122  Resp: (!) 22 18  Temp:    SpO2: 100% 100%    Last Pain:  Vitals:   06/13/21 1440  TempSrc:   PainSc: 7                  Barnet Glasgow

## 2021-06-18 ENCOUNTER — Ambulatory Visit
Admission: RE | Admit: 2021-06-18 | Discharge: 2021-06-18 | Disposition: A | Discharge status: Home / Self Care | Payer: Medicare Other | Source: Ambulatory Visit | Attending: Gastroenterology | Admitting: Gastroenterology

## 2021-06-18 DIAGNOSIS — R634 Abnormal weight loss: Secondary | ICD-10-CM

## 2021-08-28 ENCOUNTER — Other Ambulatory Visit: Payer: Self-pay | Admitting: Gastroenterology

## 2021-08-28 ENCOUNTER — Ambulatory Visit
Admission: RE | Admit: 2021-08-28 | Discharge: 2021-08-28 | Disposition: A | Discharge status: Home / Self Care | Payer: Medicare Other | Source: Ambulatory Visit | Attending: Gastroenterology | Admitting: Gastroenterology

## 2021-08-28 DIAGNOSIS — R0781 Pleurodynia: Secondary | ICD-10-CM

## 2021-09-04 ENCOUNTER — Ambulatory Visit: Payer: Medicare Other | Admitting: Cardiology

## 2021-09-10 ENCOUNTER — Ambulatory Visit: Payer: Medicare Other | Admitting: Cardiology

## 2021-09-11 ENCOUNTER — Telehealth: Payer: Self-pay

## 2021-09-11 ENCOUNTER — Other Ambulatory Visit: Payer: Self-pay | Admitting: Cardiology

## 2021-09-11 DIAGNOSIS — I2510 Atherosclerotic heart disease of native coronary artery without angina pectoris: Secondary | ICD-10-CM

## 2021-09-11 DIAGNOSIS — I4821 Permanent atrial fibrillation: Secondary | ICD-10-CM

## 2021-09-11 MED ORDER — AMIODARONE HCL 200 MG PO TABS: 100.0000 mg | ORAL_TABLET | Freq: Every day | ORAL | 1 refills | Status: DC

## 2021-09-11 NOTE — Telephone Encounter (Signed)
Pt called requesting a refill for her amiodarone 200mg . She has an appointment tomorrow but said she does not want to wait until after her appointment to pick it up.

## 2021-09-12 ENCOUNTER — Other Ambulatory Visit: Payer: Self-pay

## 2021-09-12 ENCOUNTER — Encounter: Payer: Self-pay | Admitting: Student

## 2021-09-12 ENCOUNTER — Ambulatory Visit: Payer: Medicare Other | Admitting: Student

## 2021-09-12 VITALS — BP 148/77 | HR 81 | Temp 97.8°F | Resp 16 | Ht 63.0 in | Wt 131.0 lb

## 2021-09-12 DIAGNOSIS — I10 Essential (primary) hypertension: Secondary | ICD-10-CM

## 2021-09-12 DIAGNOSIS — I4821 Permanent atrial fibrillation: Secondary | ICD-10-CM

## 2021-09-12 DIAGNOSIS — I2510 Atherosclerotic heart disease of native coronary artery without angina pectoris: Secondary | ICD-10-CM

## 2021-09-12 NOTE — Progress Notes (Signed)
Primary Physician/Referring:  Tamsen Roers, MD  Patient ID: [name], female    DOB: 1934/01/01, 87 y.o.   MRN: [id number]  Chief Complaint  Patient presents with   Atrial Fibrillation   Hypertension   Coronary Artery Disease   Follow-up    3 month   HPI: [name]  is a 87 y.o. female Caucasian patient with rare form of low-grade lung carcinoid, permanent atrial fibrillation, moderate diffuse coronary artery disease, asymptomatic right subclavian artery stenosis, hypertension, type II diabetes mellitus, hyperlipidemia.   Patient presents for 42-monthfollow-up for atrial fibrillation, hypertension, and CAD.  Last office visit patient's primary concern was dark stools and hemoglobin had dropped.  Patient subsequently underwent GI evaluation which revealed no evidence of GI bleed.  Patient has had no recurrence of dark stools.  She has been holding Xarelto since her last office visit. Her primary concern today is chronic back pain for which she has been evaluated by PCP and orthopedics reportedly. Denies chest pain, palpitations, dyspnea, syncope, near syncope, dizziness.  Past Medical History:  Diagnosis Date   Acute diverticulitis 06/2017   Anemia 06/2017   ARF (acute renal failure) ([facility] 06/2017   CAD (coronary artery disease) 03/13/2012   Diabetes mellitus    Dysrhythmia    ATRIAL FIB   Fall 07/07/2017   HTN (hypertension) 03/13/2012   Hypothyroid 03/13/2012   Lung cancer ([facility]    Shortness of breath    Stroke ([facility]    Syncope and collapse 04/02/2016   Type II or unspecified type diabetes mellitus without mention of complication, not stated as uncontrolled 03/13/2012   Past Surgical History:  Procedure Laterality Date   ABDOMINAL HYSTERECTOMY     BIOPSY  06/13/2021   Procedure: BIOPSY;  Surgeon: KRonnette Juniper MD;  Location: [facility];  Service: Gastroenterology;;   BREAST EXCISIONAL BIOPSY Left    BREAST SURGERY     benign tumors removed in [facility] 04/20/2012   Procedure: CARDIOVERSION;  Surgeon: JLaverda Page MD;  [place] [facility]  Service: Cardiovascular;  Laterality: N/A;   CARDIOVERSION N/A 05/10/2013   Procedure: CARDIOVERSION;  Surgeon: JLaverda Page MD;  [place] [facility]  Service: Cardiovascular;  Laterality: N/A;   CHOLECYSTECTOMY     COLONOSCOPY WITH PROPOFOL N/A 06/13/2021   Procedure: COLONOSCOPY WITH PROPOFOL;  Surgeon: KRonnette Juniper MD;  Location: [facility];  Service: Gastroenterology;  Laterality: N/A;   ESOPHAGOGASTRODUODENOSCOPY (EGD) WITH PROPOFOL N/A 06/13/2021   Procedure: ESOPHAGOGASTRODUODENOSCOPY (EGD) WITH PROPOFOL;  Surgeon: KRonnette Juniper MD;  Location: [facility];  Service: Gastroenterology;  Laterality: N/A;   HEMORRHOID SURGERY     jaw replacement     LEFT HEART CATHETERIZATION WITH CORONARY ANGIOGRAM N/A 04/20/2015   Procedure: LEFT HEART CATHETERIZATION WITH CORONARY ANGIOGRAM;  Surgeon: JAdrian Prows MD;  [facility];  Service: Cardiovascular;  Laterality: N/A;   tumors     back of head   Family History  Problem Relation Age of Onset   Breast cancer Mother    Aneurysm Father        Likely thoracic aneurysm per patient   Melanoma Brother    Heart disease Sister    Diabetes type II Sister    Social History   Tobacco Use   Smoking status: Never   Smokeless tobacco: Never  Substance Use Topics   Alcohol use: No    Review of Systems  Constitutional: Negative for malaise/fatigue and  weight gain.  Cardiovascular:  Positive for dyspnea on exertion (chronic, stable). Negative for chest pain, claudication, leg swelling, near-syncope, orthopnea, palpitations, paroxysmal nocturnal dyspnea and syncope.  Respiratory:  Negative for shortness of breath.   Musculoskeletal:  Positive for back pain.  Gastrointestinal:  Negative for hematochezia and melena.  Neurological:  Positive for tremors. Negative for dizziness.   Objective  Blood pressure (!)  148/77, pulse 81, temperature 97.8 F (36.6 C), resp. rate 16, height '5\' 3"'  (1.6 m), weight 131 lb (59.4 kg), SpO2 96 %. Body mass index is 23.21 kg/m.   Vitals with BMI 09/12/2021 06/13/2021 06/13/2021  Height '5\' 3"'  - -  Weight 131 lbs - -  BMI 79.89 - -  Systolic [id number]  Diastolic 77 85 83  Pulse 81 122 104    No data found.   Physical Exam Vitals reviewed.  Constitutional:      General: She is not in acute distress.    Comments: Moderately built  HENT:     Head: Normocephalic and atraumatic.  Neck:     Vascular: Carotid bruit (left carotid) present. No JVD.  Cardiovascular:     Rate and Rhythm: Normal rate. Rhythm irregular.     Pulses:          Femoral pulses are 2+ on the right side and 2+ on the left side.      Popliteal pulses are 0 on the right side and 2+ on the left side.       Dorsalis pedis pulses are 1+ on the right side and 2+ on the left side.       Posterior tibial pulses are 0 on the right side and 0 on the left side.     Heart sounds: S1 normal and S2 normal. Heart sounds are distant. Murmur heard.  High-pitched midsystolic murmur is present with a grade of 2/6 at the upper right sternal border and apex.    No gallop. No S3 or S4 sounds.  Pulmonary:     Effort: Pulmonary effort is normal. No respiratory distress.     Breath sounds: Normal breath sounds. No wheezing, rhonchi or rales.  Musculoskeletal:     Right lower leg: No edema.     Left lower leg: No edema.  Skin:    Capillary Refill: Capillary refill takes less than 2 seconds.  Neurological:     General: No focal deficit present.     Mental Status: She is alert and oriented to person, place, and time.   Laboratory examination:   CMP Latest Ref Rng & Units 03/25/2021 09/12/2019 07/05/2019  Glucose 65 - 99 mg/dL 173(H) 128(H) 116(H)  BUN 8 - 27 mg/dL 29(H) 16 25  Creatinine 0.57 - 1.00 mg/dL 1.40(H) 1.49(H) 1.53(H)  Sodium 134 - 144 mmol/L 137 143 142  Potassium 3.5 - 5.2 mmol/L 5.0 3.5 4.8   Chloride 96 - 106 mmol/L 98 102 104  CO2 20 - 29 mmol/L '23 22 22  ' Calcium 8.7 - 10.3 mg/dL 9.6 9.1 -  Total Protein 6.0 - 8.5 g/dL 6.5 6.9 -  Total Bilirubin 0.0 - 1.2 mg/dL 0.5 0.5 -  Alkaline Phos 44 - 121 IU/L 91 60 -  AST 0 - 40 IU/L 22 24 -  ALT 0 - 32 IU/L 22 23 -   CBC Latest Ref Rng & Units 05/29/2021 03/25/2021 06/20/2019  WBC 3.4 - 10.8 x10E3/uL 4.8 5.8 5.4  Hemoglobin 11.1 - 15.9 g/dL 8.9(L) 11.3 12.0  Hematocrit 34.0 -  46.6 % 27.7(L) 34.6 36.1  Platelets 150 - 450 x10E3/uL 149(L) 134(L) 130(L)   Lipid Panel Recent Labs    03/25/21 1419  CHOL 102  TRIG 104  LDLCALC 41  HDL 41     External labs:  A1C 9.100 % 09/28/2020  Creatinine, Serum 1.610 MG/ 09/28/2020  A1C 13.000 % 02/08/2020 TSH 2.420 02/08/2020  Creatinine, Serum 1.420 MG/ 02/08/2020 ALT (SGPT) 18.000 IU/ 02/08/2020  Cholesterol, total 116.000 09/12/2019 HDL 50.000 09/12/2019 LDL  45 Triglycerides 109.000 09/12/2019  A1C 8.800 % 11/16/2019; TSH 6.340 11/16/2019  Hemoglobin 12.000 G/ 06/20/2019; Platelets 130.000 06/20/2019  Creatinine, Serum 1.420 MG/ 02/08/2020 Potassium 3.500 09/12/2019 ALT (SGPT) 18.000 IU/ 02/08/2020  Labs 08/16/2018: Serum glucose 169 mg, BUN 9, creatinine 0.93, eGFR 56 mL, HB 10.7/HCT 32.9, platelets 139.  TSH markedly reduced at 0.012.  Total cholesterol 120, triglycerides 130, HDL 32, LDL 62.  Allergies   Allergies  Allergen Reactions   Codeine Nausea And Vomiting   Hydrocodone-Acetaminophen Nausea And Vomiting      Medications Prior to Visit:   Outpatient Medications Prior to Visit  Medication Sig Dispense Refill   acetaminophen (TYLENOL) 500 MG tablet Take 500-1,000 mg by mouth every 6 (six) hours as needed for moderate pain.     amiodarone (PACERONE) 200 MG tablet Take 0.5 tablets (100 mg total) by mouth daily. 90 tablet 1   B Complex-C (B-COMPLEX WITH VITAMIN C) tablet Take 1 tablet by mouth daily.     betamethasone dipropionate 0.05 % lotion Apply 1 application  topically 2 (two) times daily as needed (rash).     Calcium Carb-Cholecalciferol (CALCIUM 600+D) 600-800 MG-UNIT TABS Take 1 tablet by mouth daily.     diclofenac Sodium (VOLTAREN) 1 % GEL Apply 1 application topically 4 (four) times daily as needed (knee pain).     DILT-XR 180 MG 24 hr capsule TAKE 1 CAPSULE(180 MG) BY MOUTH DAILY (Patient taking differently: Take 180 mg by mouth daily.) 90 capsule 3   hydrochlorothiazide (MICROZIDE) 12.5 MG capsule Take 12.5 mg by mouth daily.     Ichthammol 10 % OINT Apply 1 application topically daily as needed (boils).     levothyroxine (SYNTHROID, LEVOTHROID) 100 MCG tablet Take 1 tablet (100 mcg total) by mouth daily before breakfast. (Patient taking differently: Take 150 mcg by mouth daily before breakfast.) 30 tablet 3   metoprolol succinate (TOPROL-XL) 25 MG 24 hr tablet Take 1 tablet (25 mg total) by mouth daily. Take with or immediately following a meal. 30 tablet 2   mupirocin ointment (BACTROBAN) 2 % Apply 1 application topically 2 (two) times daily as needed (sores).     pantoprazole (PROTONIX) 40 MG tablet Take 40 mg by mouth 2 (two) times daily before a meal.     rosuvastatin (CRESTOR) 20 MG tablet Take 1 tablet (20 mg total) by mouth daily. 30 tablet 2   TRADJENTA 5 MG TABS tablet Take 5 mg by mouth daily.     ONETOUCH ULTRA test strip      Rivaroxaban (XARELTO) 15 MG TABS tablet Take 1 tablet (15 mg total) by mouth daily with supper. Hold as of 05/28/2021 until stool exam and blood count is evaluated in the lab 90 tablet 1   No facility-administered medications prior to visit.     Final Medications at End of Visit    Current Meds  Medication Sig   acetaminophen (TYLENOL) 500 MG tablet Take 500-1,000 mg by mouth every 6 (six) hours as needed for moderate pain.  amiodarone (PACERONE) 200 MG tablet Take 0.5 tablets (100 mg total) by mouth daily.   B Complex-C (B-COMPLEX WITH VITAMIN C) tablet Take 1 tablet by mouth daily.   betamethasone  dipropionate 0.05 % lotion Apply 1 application topically 2 (two) times daily as needed (rash).   Calcium Carb-Cholecalciferol (CALCIUM 600+D) 600-800 MG-UNIT TABS Take 1 tablet by mouth daily.   diclofenac Sodium (VOLTAREN) 1 % GEL Apply 1 application topically 4 (four) times daily as needed (knee pain).   DILT-XR 180 MG 24 hr capsule TAKE 1 CAPSULE(180 MG) BY MOUTH DAILY (Patient taking differently: Take 180 mg by mouth daily.)   hydrochlorothiazide (MICROZIDE) 12.5 MG capsule Take 12.5 mg by mouth daily.   Ichthammol 10 % OINT Apply 1 application topically daily as needed (boils).   levothyroxine (SYNTHROID, LEVOTHROID) 100 MCG tablet Take 1 tablet (100 mcg total) by mouth daily before breakfast. (Patient taking differently: Take 150 mcg by mouth daily before breakfast.)   metoprolol succinate (TOPROL-XL) 25 MG 24 hr tablet Take 1 tablet (25 mg total) by mouth daily. Take with or immediately following a meal.   mupirocin ointment (BACTROBAN) 2 % Apply 1 application topically 2 (two) times daily as needed (sores).   pantoprazole (PROTONIX) 40 MG tablet Take 40 mg by mouth 2 (two) times daily before a meal.   rosuvastatin (CRESTOR) 20 MG tablet Take 1 tablet (20 mg total) by mouth daily.   TRADJENTA 5 MG TABS tablet Take 5 mg by mouth daily.   Radiology  No results found.  Cardiac Studies:   Coronary angiogram  06/24/2016 Mild pulmonary hypertension suggestive of primary pulmonary hypertension, mean PA pressure of 26 mmHg.  proximal LAD revealing a 50% to 60% stenosis and calcified, No significant change from 04/20/2015.  Mild aortic stenosis with a 12 mm pressure gradient across the aortic valve.  Lexiscan myoview stress test 04/26/2018:  1. Lexiscan stress test performed. Exercise capacity not assessed. Stress symptoms included dizziness and chest pain/. Normal blood pressure. The resting electrocardiogram demonstrated atrial fibrillation with rapid ventricular rate, normal resting conduction  and nonspecific ST-T changes.  Stress EKG is non diagnostic for ischemia as it is a pharmacologic stress.  2. The overall quality of the study is excellent. There is no evidence of abnormal lung activity. Stress and rest SPECT images demonstrate homogeneous tracer distribution throughout the myocardium. Gated SPECT imaging reveals normal myocardial thickening and wall motion. The left ventricular ejection fraction was normal (65%).   3. Low risk study.   Echocardiogram 03/28/2020: Left ventricle cavity is normal in size. Mild concentric hypertrophy of the left ventricle. Normal global wall motion. Normal LV systolic function with EF 55%. Unable to evaluate diastolic function due to atrial fibrillation. Left atrial cavity is moderately dilated. Trileaflet aortic valve with moderate aortic valve leaflet calcification. At least moderate aortic stenosis. Aortic valve mean gradient of 15 mmHg, Vmax of 2.6  m/s. Calculated aortic valve area by continuity equation is 0.8 cm. Dimensionless index of 0.23. Significant discrepancy between AVA and mean PG, Vmax. Consider alternate noninvasive/ invasive modality for aortic stenosis assessment, as clinically indicated.  Moderate (Grade II) aortic regurgitation. Moderate (Grade III) mitral regurgitation. Moderate tricuspid regurgitation. Estimated pulmonary artery systolic pressure is 27 mmHg. Mild pulmonic regurgitation. Compared to previous study in 2018, no signifciant changes noted in aortic valve. PASP reduced from 46 mmHg then to 27 mmHg now.   Nocturnal oximetry 10/04/2020: SPO2 <88% was 5.4 minutes and <89% was 9.3 minutes, consecutive <88% 1 minute.  Awake SPO2 92%. Oxygen desaturation events 24, index 30. Lowest SPO2 80%, highest SpO2 99%.  Impression: Very mild abnormality with SPO2 less than 88% for 5 minutes.  Awake SPO2 greater than or equal to 92%.  Although patient qualifies for nocturnal oxygen supplementation, not convinced it would be  helpful.   EKG  09/12/2021: Atrial fibrillation with controlled ventricular response at a rate of 90 bpm.  Normal axis.  Poor R wave progression, cannot exclude anteroseptal infarct old.  Low voltage complexes, consider pulmonary disease pattern.  03/25/2021: Atrial fibrillation with rapid ventricular response at rate of 108 bpm, normal axis, poor R wave progression, cannot exclude anteroseptal infarct old.  Low-voltage complexes -possible pulmonary disease.  No significant change from 03/23/2020.     Assessment     ICD-10-CM   1. Permanent atrial fibrillation (HCC)  I48.21 EKG 12-Lead    CBC    2. Primary hypertension  I10     3. Coronary artery disease involving native coronary artery of native heart without angina pectoris  I25.10       CHA2DS2-VASc Score is 6.  Yearly risk of stroke: 9.8% (F, A, HTN, DM, Vasc Dz).  Score of 1=0.6; 2=2.2; 3=3.2; 4=4.8; 5=7.2; 6=9.8; 7=>9.8) -(CHF; HTN; vasc disease DM,  Female = 1; Age <65 =0; 65-74 = 1,  >75 =2; stroke/embolism= 2).    No orders of the defined types were placed in this encounter.   Medications Discontinued During This Encounter  Medication Reason   ONETOUCH ULTRA test strip Error   Rivaroxaban (XARELTO) 15 MG TABS tablet Error    Orders Placed This Encounter  Procedures   CBC   EKG 12-Lead    Current Outpatient Medications  Medication Instructions   acetaminophen (TYLENOL) 500-1,000 mg, Oral, Every 6 hours PRN   amiodarone (PACERONE) 100 mg, Oral, Daily   B Complex-C (B-COMPLEX WITH VITAMIN C) tablet 1 tablet, Oral, Daily   betamethasone dipropionate 6.22 % lotion 1 application, Topical, 2 times daily PRN   Calcium Carb-Cholecalciferol (CALCIUM 600+D) 600-800 MG-UNIT TABS 1 tablet, Oral, Daily   diclofenac Sodium (VOLTAREN) 1 % GEL 1 application, Topical, 4 times daily PRN   DILT-XR 180 MG 24 hr capsule TAKE 1 CAPSULE(180 MG) BY MOUTH DAILY   hydrochlorothiazide (MICROZIDE) 12.5 mg, Oral, Daily   Ichthammol 10 % OINT 1  application, Apply externally, Daily PRN   levothyroxine (SYNTHROID) 100 mcg, Oral, Daily before breakfast   metoprolol succinate (TOPROL-XL) 25 mg, Oral, Daily, Take with or immediately following a meal.   mupirocin ointment (BACTROBAN) 2 % 1 application, Topical, 2 times daily PRN   pantoprazole (PROTONIX) 40 mg, Oral, 2 times daily before meals   rosuvastatin (CRESTOR) 20 mg, Oral, Daily   Tradjenta 5 mg, Oral, Daily    Recommendations:   DEVONA HOLMES  is a 87 y.o. female Caucasian patient with rare form of low-grade lung carcinoid, permanent atrial fibrillation, moderate diffuse coronary artery disease, asymptomatic right subclavian artery stenosis, hypertension, type II diabetes mellitus, hyperlipidemia.   Patient presents for 10-monthfollow-up for atrial fibrillation, hypertension, and CAD.  Last office visit patient's primary concern was dark stools and hemoglobin had dropped.  Patient subsequently underwent GI evaluation which revealed no evidence of GI bleed.  Patient has had no recurrence of dark stools.  Given CHA2DS2-VASc score of 6 would recommend anticoagulation and resumption of Xarelto, however we will first obtain repeat CBC to reevaluate hemoglobin.  Patient's blood pressure is mildly elevated in the office, however patient  reports it is well controlled at home.  Advised patient to continue to monitor blood pressure at home on a daily basis and notify our office if it is >130/80 mmHg.  Patient does have chronic dyspnea, which is stable.  Otherwise she is relatively asymptomatic from a cardiovascular standpoint.  There is no clinical evidence of heart failure.  Follow-up in 3 months, sooner if needed, for atrial fibrillation and hypertension.   Alethia Berthold, PA-C 09/17/2021, 10:39 AM Office: [phone]

## 2021-09-17 ENCOUNTER — Other Ambulatory Visit: Payer: Self-pay | Admitting: Student

## 2021-09-18 LAB — CBC
Hematocrit: 36.7 % (ref 34.0–46.6)
Hemoglobin: 11.7 g/dL (ref 11.1–15.9)
MCH: 27.3 pg (ref 26.6–33.0)
MCHC: 31.9 g/dL (ref 31.5–35.7)
MCV: 86 fL (ref 79–97)
Platelets: 128 10*3/uL — ABNORMAL LOW (ref 150–450)
RBC: 4.29 x10E6/uL (ref 3.77–5.28)
RDW: 16 % — ABNORMAL HIGH (ref 11.7–15.4)
WBC: 5.1 10*3/uL (ref 3.4–10.8)

## 2021-09-25 ENCOUNTER — Ambulatory Visit: Payer: Medicare Other | Admitting: Podiatry

## 2021-09-25 ENCOUNTER — Other Ambulatory Visit: Payer: Self-pay

## 2021-09-25 DIAGNOSIS — S90521A Blister (nonthermal), right ankle, initial encounter: Secondary | ICD-10-CM

## 2021-09-25 DIAGNOSIS — M722 Plantar fascial fibromatosis: Secondary | ICD-10-CM

## 2021-09-25 MED ORDER — CEPHALEXIN 500 MG PO CAPS: 500.0000 mg | ORAL_CAPSULE | Freq: Three times a day (TID) | ORAL | 0 refills | Status: AC

## 2021-09-25 MED ORDER — CEPHALEXIN 500 MG PO CAPS: 500.0000 mg | ORAL_CAPSULE | Freq: Three times a day (TID) | ORAL | 0 refills | Status: DC

## 2021-09-25 NOTE — Progress Notes (Signed)
  Subjective:  Patient ID: [name], female    DOB: 03/15/1934,  MRN: [id number]  Chief Complaint  Patient presents with   Blister     pain in feet, blister on r foot   Diabetes    A1C  7.8    87 y.o. female presents with the above complaint. History confirmed with patient.  She previously had an injection in the mass in the left foot last year with Dr. Amalia Hailey and got better for a while but not track down completely.  She noticed a blister forming on the outside of the ankle recently its very painful and has been draining.  She feels well denies fevers chills nausea vomiting  Objective:  Physical Exam: warm, good capillary refill, no trophic changes or ulcerative lesions, and normal DP and PT pulses. Left Foot: Plantar fibroma mid arch Right Foot: Serous blister lateral ankle no signs of infection or cellulitis  Assessment:  No diagnosis found.   Plan:  Patient was evaluated and treated and all questions answered.  Following sterile prep with Betadine and lanced the blister so that can drain and left the roof intact.  She will apply Betadine ointment to this at home and allow to dry out.  I advised to offload all pressure of this.  I did place her on Keflex for 7 days as a precaution to prevent infection  Evaluated the mass at next visit if not improving can consider injection at that point  Return in about 2 weeks (around 10/09/2021) for blister check, possible injection .

## 2021-10-15 ENCOUNTER — Ambulatory Visit: Payer: Medicare Other | Admitting: Podiatry

## 2021-10-15 ENCOUNTER — Other Ambulatory Visit: Payer: Self-pay

## 2021-10-15 ENCOUNTER — Other Ambulatory Visit: Payer: Self-pay | Admitting: Cardiology

## 2021-10-15 DIAGNOSIS — S90521A Blister (nonthermal), right ankle, initial encounter: Secondary | ICD-10-CM

## 2021-10-15 DIAGNOSIS — M722 Plantar fascial fibromatosis: Secondary | ICD-10-CM

## 2021-10-15 DIAGNOSIS — I4821 Permanent atrial fibrillation: Secondary | ICD-10-CM

## 2021-10-16 NOTE — Progress Notes (Signed)
  Subjective:  Patient ID: [name], female    DOB: 11-30-1934,  MRN: [id number]  Chief Complaint  Patient presents with   Blister     blister check, possible injection    87 y.o. female presents with the above complaint. History confirmed with patient.  The blister is doing much better, she is been applying Betadine.  The mass in the arch is still quite painful and she is interested in trying an injection again Objective:  Physical Exam: warm, good capillary refill, no trophic changes or ulcerative lesions, and normal DP and PT pulses. Left Foot: Plantar fibroma mid arch painful palpation Right Foot: Blister has healed with some dry skin, there is no open ulceration deep to this  Assessment:   1. Blister (nonthermal), right ankle, initial encounter   2. Plantar fibromatosis      Plan:  Patient was evaluated and treated and all questions answered.  Blister is doing much better and is fully healed she can resume regular bathing and does not need Betadine anymore anymore.  Advised to continue to watch for pressure reduction that may cause this to occur  Discussed risks and benefits of injection for plantar fibroma.  Following sterile prep with Betadine and and the using ethyl chloride spray as a topical anesthetic I injected 10 mg of Kenalog and 2 mg of dexamethasone into the mass directly.  She tolerated this well and was dressed with a Band-Aid.  Return if symptoms worsen or fail to improve.

## 2021-10-28 DIAGNOSIS — E042 Nontoxic multinodular goiter: Secondary | ICD-10-CM | POA: Diagnosis not present

## 2021-10-28 DIAGNOSIS — I10 Essential (primary) hypertension: Secondary | ICD-10-CM | POA: Diagnosis not present

## 2021-10-28 DIAGNOSIS — E1169 Type 2 diabetes mellitus with other specified complication: Secondary | ICD-10-CM | POA: Diagnosis not present

## 2021-10-28 DIAGNOSIS — Z8639 Personal history of other endocrine, nutritional and metabolic disease: Secondary | ICD-10-CM | POA: Diagnosis not present

## 2021-10-28 DIAGNOSIS — Z23 Encounter for immunization: Secondary | ICD-10-CM | POA: Diagnosis not present

## 2021-10-28 DIAGNOSIS — Z6822 Body mass index (BMI) 22.0-22.9, adult: Secondary | ICD-10-CM | POA: Diagnosis not present

## 2021-10-28 DIAGNOSIS — E559 Vitamin D deficiency, unspecified: Secondary | ICD-10-CM | POA: Diagnosis not present

## 2021-10-28 DIAGNOSIS — E039 Hypothyroidism, unspecified: Secondary | ICD-10-CM | POA: Diagnosis not present

## 2021-11-05 DIAGNOSIS — H40033 Anatomical narrow angle, bilateral: Secondary | ICD-10-CM | POA: Diagnosis not present

## 2021-11-05 DIAGNOSIS — E119 Type 2 diabetes mellitus without complications: Secondary | ICD-10-CM | POA: Diagnosis not present

## 2021-12-12 ENCOUNTER — Encounter: Payer: Self-pay | Admitting: Cardiology

## 2021-12-12 ENCOUNTER — Ambulatory Visit: Payer: Medicare Other | Admitting: Cardiology

## 2021-12-12 ENCOUNTER — Other Ambulatory Visit: Payer: Self-pay

## 2021-12-12 VITALS — BP 112/77 | HR 116 | Temp 98.0°F | Resp 17 | Ht 63.0 in | Wt 125.0 lb

## 2021-12-12 DIAGNOSIS — R0609 Other forms of dyspnea: Secondary | ICD-10-CM

## 2021-12-12 DIAGNOSIS — I10 Essential (primary) hypertension: Secondary | ICD-10-CM

## 2021-12-12 DIAGNOSIS — I2510 Atherosclerotic heart disease of native coronary artery without angina pectoris: Secondary | ICD-10-CM | POA: Diagnosis not present

## 2021-12-12 DIAGNOSIS — I4821 Permanent atrial fibrillation: Secondary | ICD-10-CM

## 2021-12-12 NOTE — Progress Notes (Signed)
Primary Physician/Referring:  Tamsen Roers, MD  Patient ID: [name], female    DOB: 1934/10/14, 87 y.o.   MRN: [id number]  Chief Complaint  Patient presents with   PAF   Hypertension    3 MONTH   HPI: [name]  is a 87 y.o. female female Caucasian patient with rare form of low-grade lung carcinoid, permanent atrial fibrillation, moderate diffuse coronary artery disease, asymptomatic right subclavian artery stenosis, hypertension, type II diabetes mellitus, hyperlipidemia.   Patient presents for 7-month follow-up for atrial fibrillation, hypertension, and CAD.  Her main complaint today is marked dyspnea on exertion. She has chronic back pain, walks with walker, and has had prior falls. Tolerating Xarelto.   Past Medical History:  Diagnosis Date   Acute diverticulitis 06/2017   Anemia 06/2017   ARF (acute renal failure) ([name]) 06/2017   CAD (coronary artery disease) 03/13/2012   Diabetes mellitus    Dysrhythmia    ATRIAL FIB   Fall 07/07/2017   HTN (hypertension) 03/13/2012   Hypothyroid 03/13/2012   Lung cancer ([place])    Shortness of breath    Stroke ([place])    Syncope and collapse 04/02/2016   Type II or unspecified type diabetes mellitus without mention of complication, not stated as uncontrolled 03/13/2012   Past Surgical History:  Procedure Laterality Date   ABDOMINAL HYSTERECTOMY     BIOPSY  06/13/2021   Procedure: BIOPSY;  Surgeon: Ronnette Juniper, MD;  Location: [facility];  Service: Gastroenterology;;   BREAST EXCISIONAL BIOPSY Left    BREAST SURGERY     benign tumors removed in [place]  04/20/2012   Procedure: CARDIOVERSION;  Surgeon: Laverda Page, MD;  Location: [place];  Service: Cardiovascular;  Laterality: N/A;   CARDIOVERSION N/A 05/10/2013   Procedure: CARDIOVERSION;  Surgeon: Laverda Page, MD;  Location: [place];  Service: Cardiovascular;  Laterality: N/A;   CHOLECYSTECTOMY     COLONOSCOPY WITH  PROPOFOL N/A 06/13/2021   Procedure: COLONOSCOPY WITH PROPOFOL;  Surgeon: Ronnette Juniper, MD;  [facility];  Service: Gastroenterology;  Laterality: N/A;   ESOPHAGOGASTRODUODENOSCOPY (EGD) WITH PROPOFOL N/A 06/13/2021   Procedure: ESOPHAGOGASTRODUODENOSCOPY (EGD) WITH PROPOFOL;  Surgeon: Ronnette Juniper, MD;  [facility];  Service: Gastroenterology;  Laterality: N/A;   HEMORRHOID SURGERY     jaw replacement     LEFT HEART CATHETERIZATION WITH CORONARY ANGIOGRAM N/A 04/20/2015   Procedure: LEFT HEART CATHETERIZATION WITH CORONARY ANGIOGRAM;  Surgeon: Adrian Prows, MD;  Location[facility];  Service: Cardiovascular;  Laterality: N/A;   tumors     back of head   Family History  Problem Relation Age of Onset   Breast cancer Mother    Aneurysm Father        Likely thoracic aneurysm per patient   Melanoma Brother    Heart disease Sister    Diabetes type II Sister    Social History   Tobacco Use   Smoking status: Never   Smokeless tobacco: Never  Substance Use Topics   Alcohol use: No    Review of Systems  Constitutional: Positive for malaise/fatigue.  Cardiovascular:  Positive for dyspnea on exertion (chronic, stable). Negative for chest pain and leg swelling.  Musculoskeletal:  Positive for back pain and falls.  Gastrointestinal:  Negative for hematochezia and melena.   Objective  Blood pressure 112/77, pulse (!) 116, temperature 98 F (36.7 C), temperature source Temporal, resp. rate 17, height 5\' 3"  (  1.6 m), weight 125 lb (56.7 kg), SpO2 96 %. Body mass index is 22.14 kg/m.   Vitals with BMI 12/12/2021 09/12/2021 06/13/2021  Height 5\' 3"  5\' 3"  -  Weight 125 lbs 131 lbs -  BMI 22.48 25.00 -  Systolic 370 488 891  Diastolic 77 77 85  Pulse 694 81 122    Orthostatic VS for the past 72 hrs (Last 3 readings):  Orthostatic BP Patient Position BP Location Cuff Size Orthostatic Pulse  12/12/21 1420 105/67 Standing Left Arm Normal 130  12/12/21 1419 118/79 Sitting Left  Arm Normal 106  12/12/21 1418 121/77 Supine Left Arm Normal 106     Physical Exam Vitals reviewed.  Constitutional:      General: She is not in acute distress.    Comments: Moderately built  Neck:     Vascular: Carotid bruit (left carotid) present. No JVD.  Cardiovascular:     Rate and Rhythm: Normal rate. Rhythm irregular.     Pulses:          Femoral pulses are 2+ on the right side and 2+ on the left side.      Popliteal pulses are 0 on the right side and 2+ on the left side.       Dorsalis pedis pulses are 1+ on the right side and 2+ on the left side.       Posterior tibial pulses are 0 on the right side and 0 on the left side.     Heart sounds: S1 normal and S2 normal. Heart sounds are distant. Murmur heard.  High-pitched midsystolic murmur is present with a grade of 2/6 at the upper right sternal border and apex.    No gallop. No S3 or S4 sounds.  Pulmonary:     Effort: Pulmonary effort is normal. No respiratory distress.     Breath sounds: Wheezing (bilateral bases) present. No rhonchi or rales.  Musculoskeletal:     Right lower leg: No edema.     Left lower leg: No edema.  Skin:    Capillary Refill: Capillary refill takes less than 2 seconds.   Laboratory examination:   CMP Latest Ref Rng & Units 03/25/2021 09/12/2019 07/05/2019  Glucose 65 - 99 mg/dL 173(H) 128(H) 116(H)  BUN 8 - 27 mg/dL 29(H) 16 25  Creatinine 0.57 - 1.00 mg/dL 1.40(H) 1.49(H) 1.53(H)  Sodium 134 - 144 mmol/L 137 143 142  Potassium 3.5 - 5.2 mmol/L 5.0 3.5 4.8  Chloride 96 - 106 mmol/L 98 102 104  CO2 20 - 29 mmol/L 23 22 22   Calcium 8.7 - 10.3 mg/dL 9.6 9.1 -  Total Protein 6.0 - 8.5 g/dL 6.5 6.9 -  Total Bilirubin 0.0 - 1.2 mg/dL 0.5 0.5 -  Alkaline Phos 44 - 121 IU/L 91 60 -  AST 0 - 40 IU/L 22 24 -  ALT 0 - 32 IU/L 22 23 -   CBC Latest Ref Rng & Units 09/17/2021 05/29/2021 03/25/2021  WBC 3.4 - 10.8 x10E3/uL 5.1 4.8 5.8  Hemoglobin 11.1 - 15.9 g/dL 11.7 8.9(L) 11.3  Hematocrit 34.0 - 46.6 %  36.7 27.7(L) 34.6  Platelets 150 - 450 x10E3/uL 128(L) 149(L) 134(L)   Lipid Panel Recent Labs    03/25/21 1419  CHOL 102  TRIG 104  LDLCALC 41  HDL 41   External labs:  Hemoglobin 11.600 g/d 08/28/2021 Creatinine, Serum 1.600 mg/ 06/05/2021 Potassium 3.800 mm 06/05/2021 Magnesium N/D ALT (SGPT) 19.000 U/L 06/05/2021  TSH 1.250 10/28/2021   Allergies  Allergies  Allergen Reactions   Codeine Nausea And Vomiting   Hydrocodone-Acetaminophen Nausea And Vomiting      Medications Prior to Visit:   Outpatient Medications Prior to Visit  Medication Sig Dispense Refill   acetaminophen (TYLENOL) 500 MG tablet Take 500-1,000 mg by mouth every 6 (six) hours as needed for moderate pain.     amiodarone (PACERONE) 200 MG tablet Take 0.5 tablets (100 mg total) by mouth daily. 90 tablet 1   B Complex-C (B-COMPLEX WITH VITAMIN C) tablet Take 1 tablet by mouth daily.     betamethasone dipropionate 0.05 % lotion Apply 1 application topically 2 (two) times daily as needed (rash).     Calcium Carb-Cholecalciferol (CALCIUM 600+D) 600-800 MG-UNIT TABS Take 1 tablet by mouth daily.     diclofenac Sodium (VOLTAREN) 1 % GEL Apply 1 application topically 4 (four) times daily as needed (knee pain).     DILT-XR 180 MG 24 hr capsule TAKE 1 CAPSULE(180 MG) BY MOUTH DAILY (Patient taking differently: Take 180 mg by mouth daily.) 90 capsule 3   hydrochlorothiazide (MICROZIDE) 12.5 MG capsule Take 12.5 mg by mouth daily.     Ichthammol 10 % OINT Apply 1 application topically daily as needed (boils).     levothyroxine (SYNTHROID, LEVOTHROID) 100 MCG tablet Take 1 tablet (100 mcg total) by mouth daily before breakfast. (Patient taking differently: Take 150 mcg by mouth daily before breakfast.) 30 tablet 3   metoprolol succinate (TOPROL-XL) 25 MG 24 hr tablet TAKE 1 TABLET(25 MG) BY MOUTH DAILY WITH OR IMMEDIATELY FOLLOWING A MEAL 30 tablet 2   mupirocin ointment (BACTROBAN) 2 % Apply 1 application topically 2  (two) times daily as needed (sores).     pantoprazole (PROTONIX) 40 MG tablet Take 40 mg by mouth 2 (two) times daily before a meal.     Rivaroxaban (XARELTO) 15 MG TABS tablet Take 15 mg by mouth 2 (two) times daily with a meal.     rosuvastatin (CRESTOR) 20 MG tablet Take 1 tablet (20 mg total) by mouth daily. 30 tablet 2   TRADJENTA 5 MG TABS tablet Take 5 mg by mouth daily.     No facility-administered medications prior to visit.     Final Medications at End of Visit    Outpatient Encounter Medications as of 12/12/2021  Medication Sig   acetaminophen (TYLENOL) 500 MG tablet Take 500-1,000 mg by mouth every 6 (six) hours as needed for moderate pain.   amiodarone (PACERONE) 200 MG tablet Take 0.5 tablets (100 mg total) by mouth daily.   B Complex-C (B-COMPLEX WITH VITAMIN C) tablet Take 1 tablet by mouth daily.   betamethasone dipropionate 0.05 % lotion Apply 1 application topically 2 (two) times daily as needed (rash).   Calcium Carb-Cholecalciferol (CALCIUM 600+D) 600-800 MG-UNIT TABS Take 1 tablet by mouth daily.   diclofenac Sodium (VOLTAREN) 1 % GEL Apply 1 application topically 4 (four) times daily as needed (knee pain).   DILT-XR 180 MG 24 hr capsule TAKE 1 CAPSULE(180 MG) BY MOUTH DAILY (Patient taking differently: Take 180 mg by mouth daily.)   hydrochlorothiazide (MICROZIDE) 12.5 MG capsule Take 12.5 mg by mouth daily.   Ichthammol 10 % OINT Apply 1 application topically daily as needed (boils).   levothyroxine (SYNTHROID, LEVOTHROID) 100 MCG tablet Take 1 tablet (100 mcg total) by mouth daily before breakfast. (Patient taking differently: Take 150 mcg by mouth daily before breakfast.)   metoprolol succinate (TOPROL-XL) 25 MG 24 hr tablet TAKE 1 TABLET(25 MG)  BY MOUTH DAILY WITH OR IMMEDIATELY FOLLOWING A MEAL   mupirocin ointment (BACTROBAN) 2 % Apply 1 application topically 2 (two) times daily as needed (sores).   pantoprazole (PROTONIX) 40 MG tablet Take 40 mg by mouth 2  (two) times daily before a meal.   Rivaroxaban (XARELTO) 15 MG TABS tablet Take 15 mg by mouth 2 (two) times daily with a meal.   rosuvastatin (CRESTOR) 20 MG tablet Take 1 tablet (20 mg total) by mouth daily.   [DISCONTINUED] TRADJENTA 5 MG TABS tablet Take 5 mg by mouth daily.   No facility-administered encounter medications on file as of 12/12/2021.    Radiology   Bilateral ribs and chest 4 view 08/28/2021: No fracture or other bone lesions are seen involving the ribs. There is no evidence of pneumothorax.  Chronic interstitial changes. Trace right pleural effusion. Heart size and mediastinal contours are within normal limits.  Cardiac Studies:   Coronary angiogram  06/24/2016 Mild pulmonary hypertension suggestive of primary pulmonary hypertension, mean PA pressure of 26 mmHg.  proximal LAD revealing a 50% to 60% stenosis and calcified, No significant change from 04/20/2015.  Mild aortic stenosis with a 12 mm pressure gradient across the aortic valve.  Lexiscan myoview stress test 04/26/2018:  1. Lexiscan stress test performed. Exercise capacity not assessed. Stress symptoms included dizziness and chest pain/. Normal blood pressure. The resting electrocardiogram demonstrated atrial fibrillation with rapid ventricular rate, normal resting conduction and nonspecific ST-T changes.  Stress EKG is non diagnostic for ischemia as it is a pharmacologic stress.  2. The overall quality of the study is excellent. There is no evidence of abnormal lung activity. Stress and rest SPECT images demonstrate homogeneous tracer distribution throughout the myocardium. Gated SPECT imaging reveals normal myocardial thickening and wall motion. The left ventricular ejection fraction was normal (65%).   3. Low risk study.   Echocardiogram 03/28/2020: Left ventricle cavity is normal in size. Mild concentric hypertrophy of the left ventricle. Normal global wall motion. Normal LV systolic function with EF 55%. Unable to  evaluate diastolic function due to atrial fibrillation. Left atrial cavity is moderately dilated. Trileaflet aortic valve with moderate aortic valve leaflet calcification. At least moderate aortic stenosis. Aortic valve mean gradient of 15 mmHg, Vmax of 2.6  m/s. Calculated aortic valve area by continuity equation is 0.8 cm. Dimensionless index of 0.23. Significant discrepancy between AVA and mean PG, Vmax. Consider alternate noninvasive/ invasive modality for aortic stenosis assessment, as clinically indicated.  Moderate (Grade II) aortic regurgitation. Moderate (Grade III) mitral regurgitation. Moderate tricuspid regurgitation. Estimated pulmonary artery systolic pressure is 27 mmHg. Mild pulmonic regurgitation. Compared to previous study in 2018, no signifciant changes noted in aortic valve. PASP reduced from 46 mmHg then to 27 mmHg now.   Nocturnal oximetry 10/04/2020: SPO2 <88% was 5.4 minutes and <89% was 9.3 minutes, consecutive <88% 1 minute.  Awake SPO2 92%. Oxygen desaturation events 24, index 30. Lowest SPO2 80%, highest SpO2 99%.  Impression: Very mild abnormality with SPO2 less than 88% for 5 minutes.  Awake SPO2 greater than or equal to 92%.  Although patient qualifies for nocturnal oxygen supplementation, not convinced it would be helpful.   EKG   EKG 12/12/2021: Atrial fibrillation with rapid ventricular sponsor rate of 103 bpm, normal axis, incomplete right bundle branch block.  No evidence of ischemia.  No significant change from 09/12/2021.  Assessment     ICD-10-CM   1. Permanent atrial fibrillation (HCC)  I48.21 EKG 12-Lead    2.  Primary hypertension  I10     3. Coronary artery disease involving native coronary artery of native heart without angina pectoris  I25.10     4. Dyspnea on exertion  R06.09 PCV ECHOCARDIOGRAM COMPLETE    Ambulatory referral to Pulmonology      CHA2DS2-VASc Score is 6.  Yearly risk of stroke: 9.8% (F, A, HTN, DM, Vasc Dz).  Score of 1=0.6;  2=2.2; 3=3.2; 4=4.8; 5=7.2; 6=9.8; 7=>9.8) -(CHF; HTN; vasc disease DM,  Female = 1; Age <65 =0; 65-74 = 1,  >75 =2; stroke/embolism= 2).    No orders of the defined types were placed in this encounter.   Medications Discontinued During This Encounter  Medication Reason   TRADJENTA 5 MG TABS tablet     Orders Placed This Encounter  Procedures   Ambulatory referral to Pulmonology    Referral Priority:   Routine    Referral Type:   Consultation    Referral Reason:   Specialty Services Required    Requested Specialty:   Pulmonary Disease    Number of Visits Requested:   1   EKG 12-Lead   PCV ECHOCARDIOGRAM COMPLETE    Standing Status:   Future    Standing Expiration Date:   12/12/2022    Recommendations:   [name]  is a 87 y.o. female Caucasian patient with rare form of low-grade lung carcinoid, permanent atrial fibrillation, moderate diffuse coronary artery disease, asymptomatic right subclavian artery stenosis, hypertension, type II diabetes mellitus, hyperlipidemia.   Patient presents for 42-month follow-up for atrial fibrillation, hypertension, and CAD.  Her main complaint today is marked dyspnea on exertion. There is no clinical evidence of heart failure.  Atrial fibrillation is permanent and he has been asymptomatic from this.  Repeat echocardiogram to monitor diastolic function).  Interval for low-grade carcinoid tumor lungs, abnormal chest imaging, negative for pulmonary embolism  On her last office she had complained of dark stools, GI evaluation was negative and Hb is stable.   OV in 3 months. BP is controlled. I also suspect deconditioning. No angina pectoris. EKG no ischemic change.   Adrian Prows, PA-C 12/12/2021, 2:50 PM Office: [phone]

## 2021-12-30 ENCOUNTER — Ambulatory Visit (HOSPITAL_COMMUNITY)
Admission: EM | Admit: 2021-12-30 | Discharge: 2021-12-30 | Disposition: A | Discharge status: Home / Self Care | Payer: Medicare Other | Attending: Family Medicine | Admitting: Family Medicine

## 2021-12-30 ENCOUNTER — Encounter (HOSPITAL_COMMUNITY): Payer: Self-pay | Admitting: Emergency Medicine

## 2021-12-30 ENCOUNTER — Ambulatory Visit (INDEPENDENT_AMBULATORY_CARE_PROVIDER_SITE_OTHER): Payer: Medicare Other

## 2021-12-30 ENCOUNTER — Other Ambulatory Visit: Payer: Self-pay

## 2021-12-30 DIAGNOSIS — R0602 Shortness of breath: Secondary | ICD-10-CM

## 2021-12-30 DIAGNOSIS — U071 COVID-19: Secondary | ICD-10-CM

## 2021-12-30 DIAGNOSIS — R051 Acute cough: Secondary | ICD-10-CM

## 2021-12-30 MED ORDER — BENZONATATE 100 MG PO CAPS: ORAL_CAPSULE | ORAL | 0 refills | Status: DC

## 2021-12-30 NOTE — ED Triage Notes (Signed)
Pt presents with c/o SOB, fatigue, cough. Pt states she has had these symptoms the past 2 weeks. Tested positive for Covid with home test on Friday.

## 2022-01-01 NOTE — ED Provider Notes (Signed)
[facility]   [id number] 12/30/21 Arrival Time: 1200  ASSESSMENT & PLAN:  1. COVID-19 virus infection   2. Acute cough    She is 4-5 days into illness precluding anti-viral tx initiation; discussed. No resp distress. Discussed typical duration of viral illnesses. OTC symptom care as needed. I have personally viewed the imaging studies ordered this visit. No signs of PNA. Previously known findings persist. See radiology report.  Begin: Discharge Medication List as of 12/30/2021  1:57 PM     START taking these medications   Details  benzonatate (TESSALON) 100 MG capsule Take 1 capsule by mouth every 8 (eight) hours for cough., Normal         Follow-up Information     Tamsen Roers, MD .   Specialty: Family Medicine Why: If worsening or failing to improve as anticipated. Contact information: [address] [place] [address] [phone]         [facility] EMERGENCY DEPARTMENT .   Specialty: Emergency Medicine Why: If worsening or failing to improve as anticipated. Contact information: [address] [id number] [place] [phone]                Reviewed expectations re: course of current medical issues. Questions answered. Outlined signs and symptoms indicating need for more acute intervention. Understanding verbalized. After Visit Summary given.   SUBJECTIVE: History from: patient and caregiver. Makya ARYONNA GUNNERSON is a 88 y.o. female who reports: SOB, fatigue, cough; >5 days she thisks; home COVID test + 5 d ago. Denies: fever. Normal PO intake without n/v/d.  OBJECTIVE:  Vitals:   12/30/21 1206 12/30/21 1210  BP: 122/74   Pulse: 99   Resp: 16   Temp: (!) 97.3 F (36.3 C)   TempSrc: Oral   SpO2: 99% 98%  Weight:  56.7 kg  Height:  5\' 3"  (1.6 m)    General appearance: alert; no distress Eyes: PERRLA; EOMI; conjunctiva normal HENT: [place]; AT; with nasal congestion Neck: supple   Lungs: speaks full sentences without difficulty; unlabored Extremities: no edema Skin: warm and dry Neurologic: normal gait Psychological: alert and cooperative; normal mood and affect   Imaging: DG Chest 2 View  Result Date: 12/30/2021 CLINICAL DATA:  Shortness of breath. EXAM: CHEST - 2 VIEW COMPARISON:  August 28, 2021.  June 18, 2021. FINDINGS: The heart size and mediastinal contours are within normal limits. Nodular density is noted medially in right lung base consistent with metastatic disease as noted on recent CT scan. No acute pulmonary consolidation is noted. The visualized skeletal structures are unremarkable. IMPRESSION: Nodular density seen medially and right lung base consistent with metastatic disease. No definite consolidative process is noted. Aortic Atherosclerosis (ICD10-I70.0). Electronically Signed   By: Marijo Conception M.D.   On: 12/30/2021 13:29    Allergies  Allergen Reactions   Codeine Nausea And Vomiting   Hydrocodone-Acetaminophen Nausea And Vomiting    Past Medical History:  Diagnosis Date   Acute diverticulitis 06/2017   Anemia 06/2017   ARF (acute renal failure) ([place]) 06/2017   CAD (coronary artery disease) 03/13/2012   Diabetes mellitus    Dysrhythmia    ATRIAL FIB   Fall 07/07/2017   HTN (hypertension) 03/13/2012   Hypothyroid 03/13/2012   Lung cancer ([place])    Shortness of breath    Stroke ([place])    Syncope and collapse 04/02/2016   Type II or unspecified type diabetes mellitus without mention of complication, not  stated as uncontrolled 03/13/2012   Social History   Socioeconomic History   Marital status: Married    Spouse name: Not on file   Number of children: 3   Years of education: Not on file   Highest education level: Not on file  Occupational History   Not on file  Tobacco Use   Smoking status: Never   Smokeless tobacco: Never  Vaping Use   Vaping Use: Never used  Substance and Sexual Activity   Alcohol use: No   Drug use: No    Sexual activity: Yes    Birth control/protection: None  Other Topics Concern   Not on file  Social History Narrative   Not on file   Social Determinants of Health   Financial Resource Strain: Not on file  Food Insecurity: Not on file  Transportation Needs: Not on file  Physical Activity: Not on file  Stress: Not on file  Social Connections: Not on file  Intimate Partner Violence: Not on file   Family History  Problem Relation Age of Onset   Breast cancer Mother    Aneurysm Father        Likely thoracic aneurysm per patient   Melanoma Brother    Heart disease Sister    Diabetes type II Sister    Past Surgical History:  Procedure Laterality Date   ABDOMINAL HYSTERECTOMY     BIOPSY  06/13/2021   Procedure: BIOPSY;  Surgeon: Ronnette Juniper, MD;  [facility]: [facility];  Service: Gastroenterology;;   BREAST EXCISIONAL BIOPSY Left    BREAST SURGERY     benign tumors removed in [place]  04/20/2012   Procedure: CARDIOVERSION;  Surgeon: Laverda Page, MD;  Location: [place];  Service: Cardiovascular;  Laterality: N/A;   CARDIOVERSION N/A 05/10/2013   Procedure: CARDIOVERSION;  Surgeon: Laverda Page, MD;  Location: [place];  Service: Cardiovascular;  Laterality: N/A;   CHOLECYSTECTOMY     COLONOSCOPY WITH PROPOFOL N/A 06/13/2021   Procedure: COLONOSCOPY WITH PROPOFOL;  Surgeon: Ronnette Juniper, MD;  [facility];  Service: Gastroenterology;  Laterality: N/A;   ESOPHAGOGASTRODUODENOSCOPY (EGD) WITH PROPOFOL N/A 06/13/2021   Procedure: ESOPHAGOGASTRODUODENOSCOPY (EGD) WITH PROPOFOL;  Surgeon: Ronnette Juniper, MD;  [facility];  Service: Gastroenterology;  Laterality: N/A;   HEMORRHOID SURGERY     jaw replacement     LEFT HEART CATHETERIZATION WITH CORONARY ANGIOGRAM N/A 04/20/2015   Procedure: LEFT HEART CATHETERIZATION WITH CORONARY ANGIOGRAM;  Surgeon: Adrian Prows, MD;  Location: [facility] C[facility];  Service: Cardiovascular;   Laterality: N/A;   tumors     back of head     Vanessa Kick, MD 01/01/22 [phone]

## 2022-01-06 ENCOUNTER — Institutional Professional Consult (permissible substitution): Payer: Medicare Other | Admitting: Pulmonary Disease

## 2022-01-13 ENCOUNTER — Other Ambulatory Visit: Payer: Self-pay | Admitting: Cardiology

## 2022-01-13 DIAGNOSIS — I4821 Permanent atrial fibrillation: Secondary | ICD-10-CM

## 2022-01-15 ENCOUNTER — Ambulatory Visit: Payer: Medicare Other

## 2022-01-15 ENCOUNTER — Other Ambulatory Visit: Payer: Self-pay

## 2022-01-15 DIAGNOSIS — R0609 Other forms of dyspnea: Secondary | ICD-10-CM | POA: Diagnosis not present

## 2022-01-20 NOTE — Progress Notes (Signed)
How much of the gradient is from LVOT obstruction if there is??

## 2022-01-21 ENCOUNTER — Other Ambulatory Visit: Payer: Self-pay | Admitting: Student

## 2022-01-21 DIAGNOSIS — I4821 Permanent atrial fibrillation: Secondary | ICD-10-CM

## 2022-01-24 ENCOUNTER — Ambulatory Visit: Payer: Medicare Other | Admitting: Pulmonary Disease

## 2022-01-24 ENCOUNTER — Other Ambulatory Visit: Payer: Self-pay

## 2022-01-24 ENCOUNTER — Encounter: Payer: Self-pay | Admitting: Pulmonary Disease

## 2022-01-24 VITALS — BP 128/68 | HR 102 | Ht 63.0 in | Wt 123.4 lb

## 2022-01-24 DIAGNOSIS — R0602 Shortness of breath: Secondary | ICD-10-CM

## 2022-01-24 DIAGNOSIS — K219 Gastro-esophageal reflux disease without esophagitis: Secondary | ICD-10-CM

## 2022-01-24 DIAGNOSIS — D3A090 Benign carcinoid tumor of the bronchus and lung: Secondary | ICD-10-CM | POA: Diagnosis not present

## 2022-01-24 MED ORDER — STIOLTO RESPIMAT 2.5-2.5 MCG/ACT IN AERS: 2.0000 | INHALATION_SPRAY | Freq: Every day | RESPIRATORY_TRACT | 0 refills | Status: DC

## 2022-01-24 MED ORDER — FAMOTIDINE 20 MG PO TABS: 20.0000 mg | ORAL_TABLET | Freq: Every day | ORAL | 11 refills | Status: DC

## 2022-01-24 NOTE — Progress Notes (Signed)
Patient seen in the office today and instructed on use of Stiolto.  Patient expressed understanding and demonstrated technique.  Benetta Spar [facility] 01/21/2022

## 2022-01-24 NOTE — Patient Instructions (Addendum)
Start stiolto inhaler 2 puffs daily and monitor for improvement in your breathing  We will order you an overnight oxygen test   We will check pulmonary function tests at follow up in 4-6 weeks

## 2022-01-24 NOTE — Progress Notes (Signed)
Synopsis: Referred in January 2023 for dyspnea on exertion by Dr. Tamsen Roers, MD  Subjective:   PATIENT ID: Deanna Claude GENDER: female DOB: 1934-10-02, MRN: [id number]  HPI  Chief Complaint  Patient presents with   Consult    Referred by Dr. Einar Gip for DOE for the past year. States she was on oxygen at night but took her off of it after the DME kept increasing the copay for no reason. Will also get SOB after bending over for long periods of time.    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   She reports progressive shortness of breath over the past year. She is followed by Dr. Einar Gip of cardiology, note from 12/12/21 reviewed, with low concern for heart failure leading to her dyspnea. She uses a walker to ambulate and is now short of breath walking short distances. She denies wheezing with the shortness of breath. She does have a cough with sputum production that can be thick/stringy at times. She was previously using nocturnal oxygen per Dr. Maryjean Ka but self-discontinued this due to rising costs.   She is a never smoker but does have second hand smoke exposure from her father. She is accompanied by her son [name] today. She has not tried any inhalers for her shortness of breath. She lives alone. Denies history of occupational dust or chemical exposures.  She has history of GERD which is currently active with symptoms. She was previously on pepcid for acid suppression which she tolerated well.  Note from Dr. Sharlet Salina at [facility] 02/07/2013: She had a chronic intermittent cough for three to four years with some dyspnea on exertion. A chest x-ray showed small nodules in both lungs. Subsequent CT scan was performed on 11-06-03, and this showed multiple subcentimeter and 1.0 to 2.0 cm  nodules with two in the right middle lobe, three in the right lower lobe measuring roughly 1.8 cm and three nodules on the left side as well. All total there were almost a dozen nodules in both lungs combined. There were no other signs of disease within the chest, abdomen or pelvis. CT guided biopsy of the largest lesion in the right lower lobe was performed on 09-25-03. This caused hemoptysis which quickly resolved. Pathology returned as an epithelial tumor with neuroendocrine features but could not differentiate between atypical carcinoid or typical carcinoid . Her pathology was reviewed here and felt to be a low grade neuroendocrine neoplasm favoring carcinoid tumor ,TTF1 + . She has been followed without treatment with stable nodules.  Past Medical History:  Diagnosis Date   Acute diverticulitis 06/2017   Anemia 06/2017   ARF (acute renal failure) ([place]) 06/2017   CAD (coronary artery disease) 03/13/2012   Diabetes mellitus    Dysrhythmia    ATRIAL FIB   Fall 07/07/2017   HTN (hypertension) 03/13/2012   Hypothyroid 03/13/2012   Lung cancer ([facility])    Shortness of breath    Stroke ([place])    Syncope and collapse 04/02/2016   Type II or unspecified type diabetes mellitus without mention of complication, not stated as uncontrolled 03/13/2012     Family History  Problem Relation Age of Onset   Breast cancer Mother    Aneurysm Father        Likely thoracic aneurysm per patient   Melanoma Brother  Heart disease Sister    Diabetes type II Sister      Social History   Socioeconomic History   Marital status: Married    Spouse name: Not on file   Number of children: 3   Years of education: Not on file   Highest education level: Not on file  Occupational History   Not on file  Tobacco Use   Smoking status: Never   Smokeless tobacco: Never  Vaping Use   Vaping Use: Never used  Substance and Sexual Activity   Alcohol use: No   Drug use: No   Sexual activity: Yes    Birth  control/protection: None  Other Topics Concern   Not on file  Social History Narrative   Not on file   Social Determinants of Health   Financial Resource Strain: Not on file  Food Insecurity: Not on file  Transportation Needs: Not on file  Physical Activity: Not on file  Stress: Not on file  Social Connections: Not on file  Intimate Partner Violence: Not on file     Allergies  Allergen Reactions   Codeine Nausea And Vomiting   Hydrocodone-Acetaminophen Nausea And Vomiting     Outpatient Medications Prior to Visit  Medication Sig Dispense Refill   acetaminophen (TYLENOL) 500 MG tablet Take 500-1,000 mg by mouth every 6 (six) hours as needed for moderate pain.     amiodarone (PACERONE) 200 MG tablet Take 0.5 tablets (100 mg total) by mouth daily. 90 tablet 1   B Complex-C (B-COMPLEX WITH VITAMIN C) tablet Take 1 tablet by mouth daily.     benzonatate (TESSALON) 100 MG capsule Take 1 capsule by mouth every 8 (eight) hours for cough. 21 capsule 0   betamethasone dipropionate 0.05 % lotion Apply 1 application topically 2 (two) times daily as needed (rash).     Calcium Carb-Cholecalciferol (CALCIUM 600+D) 600-800 MG-UNIT TABS Take 1 tablet by mouth daily.     diclofenac Sodium (VOLTAREN) 1 % GEL Apply 1 application topically 4 (four) times daily as needed (knee pain).     DILT-XR 180 MG 24 hr capsule TAKE 1 CAPSULE(180 MG) BY MOUTH DAILY 90 capsule 3   hydrochlorothiazide (MICROZIDE) 12.5 MG capsule Take 12.5 mg by mouth daily.     Ichthammol 10 % OINT Apply 1 application topically daily as needed (boils).     levothyroxine (SYNTHROID, LEVOTHROID) 100 MCG tablet Take 1 tablet (100 mcg total) by mouth daily before breakfast. (Patient taking differently: Take 150 mcg by mouth daily before breakfast.) 30 tablet 3   metoprolol succinate (TOPROL-XL) 25 MG 24 hr tablet TAKE 1 TABLET(25 MG) BY MOUTH DAILY WITH OR IMMEDIATELY FOLLOWING A MEAL 30 tablet 2   mupirocin ointment (BACTROBAN) 2 %  Apply 1 application topically 2 (two) times daily as needed (sores).     pantoprazole (PROTONIX) 40 MG tablet Take 40 mg by mouth 2 (two) times daily before a meal.     Rivaroxaban (XARELTO) 15 MG TABS tablet Take 15 mg by mouth 2 (two) times daily with a meal.     rosuvastatin (CRESTOR) 20 MG tablet Take 1 tablet (20 mg total) by mouth daily. 30 tablet 2   No facility-administered medications prior to visit.   Review of Systems  Constitutional:  Negative for chills, fever, malaise/fatigue and weight loss.  HENT:  Negative for congestion, sinus pain and sore throat.   Eyes: Negative.   Respiratory:  Positive for cough and shortness of breath. Negative for hemoptysis, sputum production  and wheezing.   Cardiovascular:  Negative for chest pain, palpitations, orthopnea, claudication and leg swelling.  Gastrointestinal:  Negative for abdominal pain, heartburn, nausea and vomiting.  Genitourinary: Negative.   Musculoskeletal:  Negative for joint pain and myalgias.  Skin:  Negative for rash.  Neurological:  Negative for weakness.  Endo/Heme/Allergies: Negative.   Psychiatric/Behavioral: Negative.     Objective:   Vitals:   01/24/22 1026  BP: 128/68  Pulse: (!) 102  SpO2: 99%  Weight: 123 lb 6.4 oz (56 kg)  Height: 5\' 3"  (1.6 m)    Physical Exam Constitutional:      General: She is not in acute distress.    Appearance: She is not ill-appearing.  HENT:     Head: Normocephalic and atraumatic.  Eyes:     General: No scleral icterus.    Conjunctiva/sclera: Conjunctivae normal.     Pupils: Pupils are equal, round, and reactive to light.  Cardiovascular:     Rate and Rhythm: Normal rate and regular rhythm.     Pulses: Normal pulses.     Heart sounds: Normal heart sounds. No murmur heard. Pulmonary:     Effort: Pulmonary effort is normal.     Breath sounds: Normal breath sounds. Decreased air movement present. No wheezing, rhonchi or rales.  Abdominal:     General: Bowel sounds are  normal.     Palpations: Abdomen is soft.  Musculoskeletal:     Right lower leg: No edema.     Left lower leg: No edema.  Lymphadenopathy:     Cervical: No cervical adenopathy.  Skin:    General: Skin is warm and dry.  Neurological:     General: No focal deficit present.     Mental Status: She is alert.  Psychiatric:        Mood and Affect: Mood normal.        Behavior: Behavior normal.        Thought Content: Thought content normal.        Judgment: Judgment normal.   CBC    Component Value Date/Time   WBC 5.1 09/17/2021 1316   WBC 4.7 08/16/2018 1313   RBC 4.29 09/17/2021 1316   RBC 3.66 (L) 08/16/2018 1313   HGB 11.7 09/17/2021 1316   HCT 36.7 09/17/2021 1316   PLT 128 (L) 09/17/2021 1316   MCV 86 09/17/2021 1316   MCH 27.3 09/17/2021 1316   MCH 29.2 08/16/2018 1313   MCHC 31.9 09/17/2021 1316   MCHC 32.5 08/16/2018 1313   RDW 16.0 (H) 09/17/2021 1316   LYMPHSABS 2.5 07/08/2017 0127   MONOABS 0.7 07/08/2017 0127   EOSABS 0.1 07/08/2017 0127   BASOSABS 0.0 07/08/2017 0127   BMP Latest Ref Rng & Units 03/25/2021 09/12/2019 07/05/2019  Glucose 65 - 99 mg/dL 173(H) 128(H) 116(H)  BUN 8 - 27 mg/dL 29(H) 16 25  Creatinine 0.57 - 1.00 mg/dL 1.40(H) 1.49(H) 1.53(H)  BUN/Creat Ratio 12 - 28 21 11(L) 16  Sodium 134 - 144 mmol/L 137 143 142  Potassium 3.5 - 5.2 mmol/L 5.0 3.5 4.8  Chloride 96 - 106 mmol/L 98 102 104  CO2 20 - 29 mmol/L 23 22 22   Calcium 8.7 - 10.3 mg/dL 9.6 9.1 -   Chest imaging: CXR 12/30/21 The heart size and mediastinal contours are within normal limits. Nodular density is noted medially in right lung base consistent with metastatic disease as noted on recent CT scan. No acute pulmonary consolidation is noted. The visualized skeletal structures are  unremarkable.  PFT: No flowsheet data found.  Labs:  Path:  Echo: 01/15/22 LV EF 59%. Moderate aortic stenosis. Mild aortic regurge, mild mitral regurge. Mild tricuspid regurge. PASP 101mmHg.   Heart  Catheterization 05/2016: Coronary angiogram: Mild to moderate diffuse coronary artery disease without high-grade stenosis. Proximal LAD with a 40-50% stenosis, in some views at most 50-60% stenosed and calcified. Small circumflex, mildly diseased large right coronary artery.   Aortic valve not crossed. Difficulty in crossing. Patient has history of mild aortic stenosis by echocardiogram April 2017.   Right heart catheterization revealing mild pulmonary hypertension, PA mean pressure of 26 mmHg, wedge mean was 13 mmHg. Cardiac output 3.7, cardiac index 2.3 by Fick. There was no significant shunt, QP: QS 0.79. RA mean 3 mmHg, RA saturation 59%.; RV 34/0, EDP 3 mmHg.; PTA 36/17, mean 26 mmHg. PA saturation 49%.; Pulmonary wedge mean of 13 mmHg.   Recommendation: Patient main complaint is dyspnea, consideration for pulmonary vasodilator therapy for pulmonary hypertension may be given in view of low wedge pressure and high PA mean pressure. Patient will be discharged home with outpatient follow-up.  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   Her shortness of breath could be multifactorial in setting of deconditioning vs obstructive lung disease vs reactive airways disease in setting of neuroendocrine tumor involvement of the lung. She also has cough with sputum production in setting of active GERD.   She is to start famotidine 20mg  at bedtime for GERD. We will trial her on stiolto respimat 2 puffs daily and monitor for improvement in her shortness of breath.  We will  also schedule her for nocturnal oximetry test.   Return in 4-6 weeks for PFTs and follow up visit.  Freda Jackson, MD [facility] Office: [phone]   Current Outpatient Medications:    acetaminophen (TYLENOL) 500 MG tablet, Take 500-1,000 mg by mouth every 6 (six) hours as needed for moderate pain., Disp: , Rfl:    amiodarone (PACERONE) 200 MG tablet, Take 0.5 tablets (100 mg total) by mouth daily., Disp: 90 tablet, Rfl: 1   B Complex-C (B-COMPLEX WITH VITAMIN C) tablet, Take 1 tablet by mouth daily., Disp: , Rfl:    benzonatate (TESSALON) 100 MG capsule, Take 1 capsule by mouth every 8 (eight) hours for cough., Disp: 21 capsule, Rfl: 0   betamethasone dipropionate 0.05 % lotion, Apply 1 application topically 2 (two) times daily as needed (rash)., Disp: , Rfl:    Calcium Carb-Cholecalciferol (CALCIUM 600+D) 600-800 MG-UNIT TABS, Take 1 tablet by mouth daily., Disp: , Rfl:    diclofenac Sodium (VOLTAREN) 1 % GEL, Apply 1 application topically 4 (four) times daily as needed (knee pain)., Disp: , Rfl:    DILT-XR 180 MG 24 hr capsule, TAKE 1 CAPSULE(180 MG) BY MOUTH DAILY, Disp: 90 capsule, Rfl: 3   famotidine (PEPCID) 20 MG tablet, Take 1 tablet (20 mg total) by mouth at bedtime., Disp: 30 tablet, Rfl: 11   hydrochlorothiazide (MICROZIDE) 12.5 MG capsule, Take 12.5 mg by mouth daily., Disp: , Rfl:    Ichthammol 10 % OINT, Apply 1 application  topically daily as needed (boils)., Disp: , Rfl:    levothyroxine (SYNTHROID, LEVOTHROID) 100 MCG tablet, Take 1 tablet (100 mcg total) by mouth daily before breakfast. (Patient taking differently: Take 150 mcg by mouth daily before breakfast.), Disp: 30 tablet, Rfl: 3   metoprolol succinate (TOPROL-XL) 25 MG 24 hr tablet, TAKE 1 TABLET(25 MG) BY MOUTH DAILY WITH OR IMMEDIATELY FOLLOWING A MEAL, Disp: 30 tablet, Rfl: 2   mupirocin ointment (BACTROBAN) 2 %, Apply 1 application topically 2 (two) times daily as needed (sores)., Disp: ,  Rfl:    pantoprazole (PROTONIX) 40 MG tablet, Take 40 mg by mouth 2 (two) times daily before a meal., Disp: , Rfl:    Rivaroxaban (XARELTO) 15 MG TABS tablet, Take 15 mg by mouth 2 (two) times daily with a meal., Disp: , Rfl:    rosuvastatin (CRESTOR) 20 MG tablet, Take 1 tablet (20 mg total) by mouth daily., Disp: 30 tablet, Rfl: 2

## 2022-01-25 ENCOUNTER — Encounter: Payer: Self-pay | Admitting: Pulmonary Disease

## 2022-01-30 DIAGNOSIS — R0609 Other forms of dyspnea: Secondary | ICD-10-CM | POA: Diagnosis not present

## 2022-02-25 DIAGNOSIS — E039 Hypothyroidism, unspecified: Secondary | ICD-10-CM | POA: Diagnosis not present

## 2022-02-25 DIAGNOSIS — E119 Type 2 diabetes mellitus without complications: Secondary | ICD-10-CM | POA: Diagnosis not present

## 2022-02-27 ENCOUNTER — Other Ambulatory Visit: Payer: Self-pay

## 2022-02-27 ENCOUNTER — Ambulatory Visit: Payer: Medicare Other | Admitting: Pulmonary Disease

## 2022-02-27 ENCOUNTER — Encounter: Payer: Self-pay | Admitting: Pulmonary Disease

## 2022-02-27 ENCOUNTER — Ambulatory Visit (INDEPENDENT_AMBULATORY_CARE_PROVIDER_SITE_OTHER): Payer: Medicare Other | Admitting: Pulmonary Disease

## 2022-02-27 VITALS — BP 120/72 | HR 85 | Ht 62.0 in | Wt 120.0 lb

## 2022-02-27 DIAGNOSIS — D3A090 Benign carcinoid tumor of the bronchus and lung: Secondary | ICD-10-CM

## 2022-02-27 DIAGNOSIS — R0602 Shortness of breath: Secondary | ICD-10-CM | POA: Diagnosis not present

## 2022-02-27 LAB — PULMONARY FUNCTION TEST
DL/VA % pred: 64 %
DL/VA: 2.64 ml/min/mmHg/L
DLCO cor % pred: 54 %
DLCO cor: 9.34 ml/min/mmHg
DLCO unc % pred: 54 %
DLCO unc: 9.34 ml/min/mmHg
FEF 25-75 Post: 0.86 L/sec
FEF 25-75 Pre: 0.81 L/sec
FEF2575-%Change-Post: 5 %
FEF2575-%Pred-Post: 82 %
FEF2575-%Pred-Pre: 78 %
FEV1-%Change-Post: 1 %
FEV1-%Pred-Post: 74 %
FEV1-%Pred-Pre: 73 %
FEV1-Post: 1.17 L
FEV1-Pre: 1.15 L
FEV1FVC-%Change-Post: 3 %
FEV1FVC-%Pred-Pre: 102 %
FEV6-%Change-Post: -6 %
FEV6-%Pred-Post: 71 %
FEV6-%Pred-Pre: 76 %
FEV6-Post: 1.44 L
FEV6-Pre: 1.54 L
FEV6FVC-%Change-Post: -4 %
FEV6FVC-%Pred-Post: 102 %
FEV6FVC-%Pred-Pre: 106 %
FVC-%Change-Post: -2 %
FVC-%Pred-Post: 70 %
FVC-%Pred-Pre: 71 %
FVC-Post: 1.51 L
FVC-Pre: 1.54 L
Post FEV1/FVC ratio: 77 %
Post FEV6/FVC ratio: 96 %
Pre FEV1/FVC ratio: 75 %
Pre FEV6/FVC Ratio: 100 %
RV % pred: 102 %
RV: 2.45 L
TLC % pred: 91 %
TLC: 4.33 L

## 2022-02-27 MED ORDER — STIOLTO RESPIMAT 2.5-2.5 MCG/ACT IN AERS: 2.0000 | INHALATION_SPRAY | Freq: Every day | RESPIRATORY_TRACT | 11 refills | Status: DC

## 2022-02-27 NOTE — Patient Instructions (Addendum)
Use stiolto 2 puffs daily ? ?Use famotidine (pepcid) at bed time for reflux ? ?We will check your oxygen levels today while walking ? ?We will follow up about the overnight oximetry test results ? ?Follow up in 6 months ?

## 2022-02-27 NOTE — Progress Notes (Signed)
Synopsis: Referred in January 2023 for dyspnea on exertion by Dr. Tamsen Roers, MD  Subjective:   PATIENT ID: [name] GENDER: female DOB: [name], [name]  HPI  Chief Complaint  Patient presents with   Follow-up    F/U after PFT. States the Stiolto samples did help with her SOB.    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   She reports improvement in her breathing with stiolto since last visit. She is having a productive cough.   PFTs today show moderate diffusion defect.   OV 01/24/22 She reports progressive shortness of breath over the past year. She is followed by Dr. Einar Gip of cardiology, note from 12/12/21 reviewed, with low concern for heart failure leading to her dyspnea. She uses a walker to ambulate and is now short of breath walking short distances. She denies wheezing with the shortness of breath. She does have a cough with sputum production that can be thick/stringy at times. She was previously using nocturnal oxygen per Dr. Maryjean Ka but self-discontinued this due to rising costs.   She is a never smoker but does have second hand smoke exposure from her father. She is accompanied by her son [name] today. She has not tried any inhalers for her shortness of breath. She lives alone. Denies history of occupational dust or chemical exposures.  She has history of GERD which is currently active with symptoms. She was previously on pepcid for acid suppression which she tolerated well.  Note from Dr. Sharlet Salina at [facility] 02/07/2013: She had a chronic intermittent cough for three to four years with some dyspnea on exertion. A chest x-ray showed small nodules in both lungs. Subsequent CT scan was performed on 11-06-03, and this showed multiple subcentimeter and 1.0 to 2.0  cm nodules with two in the right middle lobe, three in the right lower lobe measuring roughly 1.8 cm and three nodules on the left side as well. All total there were almost a dozen nodules in both lungs combined. There were no other signs of disease within the chest, abdomen or pelvis. CT guided biopsy of the largest lesion in the right lower lobe was performed on 09-25-03. This caused hemoptysis which quickly resolved. Pathology returned as an epithelial tumor with neuroendocrine features but could not differentiate between atypical carcinoid or typical carcinoid . Her pathology was reviewed here and felt to be a low grade neuroendocrine neoplasm favoring carcinoid tumor ,TTF1 + . She has been followed without treatment with stable nodules.  Past Medical History:  Diagnosis Date   Acute diverticulitis 06/2017   Anemia 06/2017   ARF (acute renal failure) ([place]) 06/2017   CAD (coronary artery disease) 03/13/2012   Diabetes mellitus    Dysrhythmia    ATRIAL FIB   Fall 07/07/2017   HTN (hypertension) 03/13/2012   Hypothyroid 03/13/2012   Lung cancer ([facility])    Shortness of breath    Stroke ([place])    Syncope and collapse 04/02/2016   Type II or unspecified type diabetes mellitus without mention of complication, not stated as uncontrolled 03/13/2012     Family History  Problem Relation Age of Onset   Breast cancer Mother    Aneurysm Father        Likely thoracic aneurysm per patient   Melanoma Brother    Heart disease  Sister    Diabetes type II Sister      Social History   Socioeconomic History   Marital status: Married    Spouse name: Not on file   Number of children: 3   Years of education: Not on file   Highest education level: Not on file  Occupational History   Not on file  Tobacco Use   Smoking status: Never   Smokeless tobacco: Never  Vaping Use   Vaping Use: Never used  Substance and Sexual Activity   Alcohol use: No   Drug use: No   Sexual activity: Yes    Birth  control/protection: None  Other Topics Concern   Not on file  Social History Narrative   Not on file   Social Determinants of Health   Financial Resource Strain: Not on file  Food Insecurity: Not on file  Transportation Needs: Not on file  Physical Activity: Not on file  Stress: Not on file  Social Connections: Not on file  Intimate Partner Violence: Not on file     Allergies  Allergen Reactions   Codeine Nausea And Vomiting   Hydrocodone-Acetaminophen Nausea And Vomiting     Outpatient Medications Prior to Visit  Medication Sig Dispense Refill   acetaminophen (TYLENOL) 500 MG tablet Take 500-1,000 mg by mouth every 6 (six) hours as needed for moderate pain.     amiodarone (PACERONE) 200 MG tablet Take 0.5 tablets (100 mg total) by mouth daily. 90 tablet 1   B Complex-C (B-COMPLEX WITH VITAMIN C) tablet Take 1 tablet by mouth daily.     benzonatate (TESSALON) 100 MG capsule Take 1 capsule by mouth every 8 (eight) hours for cough. 21 capsule 0   betamethasone dipropionate 0.05 % lotion Apply 1 application topically 2 (two) times daily as needed (rash).     Calcium Carb-Cholecalciferol (CALCIUM 600+D) 600-800 MG-UNIT TABS Take 1 tablet by mouth daily.     diclofenac Sodium (VOLTAREN) 1 % GEL Apply 1 application topically 4 (four) times daily as needed (knee pain).     DILT-XR 180 MG 24 hr capsule TAKE 1 CAPSULE(180 MG) BY MOUTH DAILY 90 capsule 3   famotidine (PEPCID) 20 MG tablet Take 1 tablet (20 mg total) by mouth at bedtime. 30 tablet 11   hydrochlorothiazide (MICROZIDE) 12.5 MG capsule Take 12.5 mg by mouth daily.     Ichthammol 10 % OINT Apply 1 application topically daily as needed (boils).     levothyroxine (SYNTHROID, LEVOTHROID) 100 MCG tablet Take 1 tablet (100 mcg total) by mouth daily before breakfast. (Patient taking differently: Take 150 mcg by mouth daily before breakfast.) 30 tablet 3   metoprolol succinate (TOPROL-XL) 25 MG 24 hr tablet TAKE 1 TABLET(25 MG) BY  MOUTH DAILY WITH OR IMMEDIATELY FOLLOWING A MEAL 30 tablet 2   mupirocin ointment (BACTROBAN) 2 % Apply 1 application topically 2 (two) times daily as needed (sores).     pantoprazole (PROTONIX) 40 MG tablet Take 40 mg by mouth 2 (two) times daily before a meal.     Rivaroxaban (XARELTO) 15 MG TABS tablet Take 15 mg by mouth 2 (two) times daily with a meal.     Tiotropium Bromide-Olodaterol (STIOLTO RESPIMAT) 2.5-2.5 MCG/ACT AERS Inhale 2 puffs into the lungs daily. 8 g 0   rosuvastatin (CRESTOR) 20 MG tablet Take 1 tablet (20 mg total) by mouth daily. 30 tablet 2   No facility-administered medications prior to visit.   Review of Systems  Constitutional:  Negative for  chills, fever, malaise/fatigue and weight loss.  HENT:  Negative for congestion, sinus pain and sore throat.   Eyes: Negative.   Respiratory:  Positive for cough and shortness of breath. Negative for hemoptysis, sputum production and wheezing.   Cardiovascular:  Negative for chest pain, palpitations, orthopnea, claudication and leg swelling.  Gastrointestinal:  Negative for abdominal pain, heartburn, nausea and vomiting.  Genitourinary: Negative.   Musculoskeletal:  Negative for joint pain and myalgias.  Skin:  Negative for rash.  Neurological:  Negative for weakness.  Endo/Heme/Allergies: Negative.   Psychiatric/Behavioral: Negative.     Objective:   Vitals:   02/27/22 1112  BP: 120/72  Pulse: 85  SpO2: 98%  Weight: 120 lb (54.4 kg)  Height: 5\' 2"  (1.575 m)    Physical Exam Constitutional:      General: She is not in acute distress.    Appearance: She is not ill-appearing.  HENT:     Head: Normocephalic and atraumatic.  Eyes:     General: No scleral icterus.    Conjunctiva/sclera: Conjunctivae normal.  Cardiovascular:     Rate and Rhythm: Normal rate and regular rhythm.     Pulses: Normal pulses.     Heart sounds: Normal heart sounds. No murmur heard. Pulmonary:     Effort: Pulmonary effort is normal.      Breath sounds: Normal breath sounds. Decreased air movement present. No wheezing, rhonchi or rales.  Musculoskeletal:     Right lower leg: No edema.     Left lower leg: No edema.  Skin:    General: Skin is warm and dry.  Neurological:     General: No focal deficit present.     Mental Status: She is alert.  Psychiatric:        Mood and Affect: Mood normal.        Behavior: Behavior normal.        Thought Content: Thought content normal.        Judgment: Judgment normal.   CBC    Component Value Date/Time   WBC 5.1 09/17/2021 1316   WBC 4.7 08/16/2018 1313   RBC 4.29 09/17/2021 1316   RBC 3.66 (L) 08/16/2018 1313   HGB 11.7 09/17/2021 1316   HCT 36.7 09/17/2021 1316   PLT 128 (L) 09/17/2021 1316   MCV 86 09/17/2021 1316   MCH 27.3 09/17/2021 1316   MCH 29.2 08/16/2018 1313   MCHC 31.9 09/17/2021 1316   MCHC 32.5 08/16/2018 1313   RDW 16.0 (H) 09/17/2021 1316   LYMPHSABS 2.5 07/08/2017 0127   MONOABS 0.7 07/08/2017 0127   EOSABS 0.1 07/08/2017 0127   BASOSABS 0.0 07/08/2017 0127   BMP Latest Ref Rng & Units 03/25/2021 09/12/2019 07/05/2019  Glucose 65 - 99 mg/dL 173(H) 128(H) 116(H)  BUN 8 - 27 mg/dL 29(H) 16 25  Creatinine 0.57 - 1.00 mg/dL 1.40(H) 1.49(H) 1.53(H)  BUN/Creat Ratio 12 - 28 21 11(L) 16  Sodium 134 - 144 mmol/L 137 143 142  Potassium 3.5 - 5.2 mmol/L 5.0 3.5 4.8  Chloride 96 - 106 mmol/L 98 102 104  CO2 20 - 29 mmol/L 23 22 22   Calcium 8.7 - 10.3 mg/dL 9.6 9.1 -   Chest imaging: CXR 12/30/21 The heart size and mediastinal contours are within normal limits. Nodular density is noted medially in right lung base consistent with metastatic disease as noted on recent CT scan. No acute pulmonary consolidation is noted. The visualized skeletal structures are unremarkable.  PFT: PFT Results Latest Ref Rng &  Units 02/27/2022  FVC-Pre L 1.54  FVC-Predicted Pre % 71  FVC-Post L 1.51  FVC-Predicted Post % 70  Pre FEV1/FVC % % 75  Post FEV1/FCV % % 77  FEV1-Pre  L 1.15  FEV1-Predicted Pre % 73  FEV1-Post L 1.17  DLCO uncorrected ml/min/mmHg 9.34  DLCO UNC% % 54  DLCO corrected ml/min/mmHg 9.34  DLCO COR %Predicted % 54  DLVA Predicted % 64  TLC L 4.33  TLC % Predicted % 91  RV % Predicted % 102    Labs:  Path:  Echo: 01/15/22 LV EF 59%. Moderate aortic stenosis. Mild aortic regurge, mild mitral regurge. Mild tricuspid regurge. PASP 48mmHg.   Heart Catheterization 05/2016: Coronary angiogram: Mild to moderate diffuse coronary artery disease without high-grade stenosis. Proximal LAD with a 40-50% stenosis, in some views at most 50-60% stenosed and calcified. Small circumflex, mildly diseased large right coronary artery.   Aortic valve not crossed. Difficulty in crossing. Patient has history of mild aortic stenosis by echocardiogram April 2017.   Right heart catheterization revealing mild pulmonary hypertension, PA mean pressure of 26 mmHg, wedge mean was 13 mmHg. Cardiac output 3.7, cardiac index 2.3 by Fick. There was no significant shunt, QP: QS 0.79. RA mean 3 mmHg, RA saturation 59%.; RV 34/0, EDP 3 mmHg.; PTA 36/17, mean 26 mmHg. PA saturation 49%.; Pulmonary wedge mean of 13 mmHg.   Recommendation: Patient main complaint is dyspnea, consideration for pulmonary vasodilator therapy for pulmonary hypertension may be given in view of low wedge pressure and high PA mean pressure. Patient will be discharged home with outpatient follow-up.  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   She has nonspecific PFT pattern with moderate diffusion defect. She did not require on simple walk in  clinic today. She reports having overnight oximetry test completed but we do not have the results yet.   She is to continue stiolto inhaler 2 puffs daily.   Follow up in 6 months.  Freda Jackson, MD [facility] Office: [phone]   Current Outpatient Medications:    acetaminophen (TYLENOL) 500 MG tablet, Take 500-1,000 mg by mouth every 6 (six) hours as needed for moderate pain., Disp: , Rfl:    amiodarone (PACERONE) 200 MG tablet, Take 0.5 tablets (100 mg total) by mouth daily., Disp: 90 tablet, Rfl: 1   B Complex-C (B-COMPLEX WITH VITAMIN C) tablet, Take 1 tablet by mouth daily., Disp: , Rfl:    benzonatate (TESSALON) 100 MG capsule, Take 1 capsule by mouth every 8 (eight) hours for cough., Disp: 21 capsule, Rfl: 0   betamethasone dipropionate 0.05 % lotion, Apply 1 application topically 2 (two) times daily as needed (rash)., Disp: , Rfl:    Calcium Carb-Cholecalciferol (CALCIUM 600+D) 600-800 MG-UNIT TABS, Take 1 tablet by mouth daily., Disp: , Rfl:    diclofenac Sodium (VOLTAREN) 1 % GEL, Apply 1 application topically 4 (four) times daily as needed (knee pain)., Disp: , Rfl:    DILT-XR 180 MG 24 hr capsule, TAKE 1 CAPSULE(180 MG) BY MOUTH DAILY, Disp: 90 capsule, Rfl: 3   famotidine (PEPCID) 20 MG tablet, Take 1 tablet (20 mg total) by mouth at bedtime., Disp: 30 tablet,  Rfl: 11   hydrochlorothiazide (MICROZIDE) 12.5 MG capsule, Take 12.5 mg by mouth daily., Disp: , Rfl:    Ichthammol 10 % OINT, Apply 1 application topically daily as needed (boils)., Disp: , Rfl:    levothyroxine (SYNTHROID, LEVOTHROID) 100 MCG tablet, Take 1 tablet (100 mcg total) by mouth daily before breakfast. (Patient taking differently: Take 150 mcg by mouth daily before breakfast.), Disp: 30 tablet, Rfl: 3   metoprolol succinate (TOPROL-XL) 25 MG 24 hr tablet, TAKE 1 TABLET(25 MG) BY MOUTH DAILY WITH OR IMMEDIATELY FOLLOWING A MEAL, Disp: 30 tablet, Rfl: 2   mupirocin ointment (BACTROBAN)  2 %, Apply 1 application topically 2 (two) times daily as needed (sores)., Disp: , Rfl:    pantoprazole (PROTONIX) 40 MG tablet, Take 40 mg by mouth 2 (two) times daily before a meal., Disp: , Rfl:    Rivaroxaban (XARELTO) 15 MG TABS tablet, Take 15 mg by mouth 2 (two) times daily with a meal., Disp: , Rfl:    Tiotropium Bromide-Olodaterol (STIOLTO RESPIMAT) 2.5-2.5 MCG/ACT AERS, Inhale 2 puffs into the lungs daily., Disp: 4 g, Rfl: 11   rosuvastatin (CRESTOR) 20 MG tablet, Take 1 tablet (20 mg total) by mouth daily., Disp: 30 tablet, Rfl: 2

## 2022-02-27 NOTE — Progress Notes (Signed)
PFT done today. 

## 2022-03-01 ENCOUNTER — Encounter: Payer: Self-pay | Admitting: Pulmonary Disease

## 2022-03-01 ENCOUNTER — Telehealth: Payer: Self-pay | Admitting: Pulmonary Disease

## 2022-03-01 NOTE — Telephone Encounter (Signed)
Please let patient and her son know that about the [facility] at [facility] as they were looking for a new primary care doctor. This is about 10 minutes from her home.  ? ?Office Address: ?[address], [place], [place] [address] ? ?Office Phone Number: ?[phone] ? ?Thanks,  ?JD ?

## 2022-03-10 NOTE — Telephone Encounter (Signed)
Called patient but she did not answer. Phone rang 3 times before I received the busy tone. Will attempt to call back tomorrow.  ?

## 2022-03-24 DIAGNOSIS — Z8639 Personal history of other endocrine, nutritional and metabolic disease: Secondary | ICD-10-CM | POA: Diagnosis not present

## 2022-03-24 DIAGNOSIS — I10 Essential (primary) hypertension: Secondary | ICD-10-CM | POA: Diagnosis not present

## 2022-03-24 DIAGNOSIS — Z6822 Body mass index (BMI) 22.0-22.9, adult: Secondary | ICD-10-CM | POA: Diagnosis not present

## 2022-03-24 DIAGNOSIS — E042 Nontoxic multinodular goiter: Secondary | ICD-10-CM | POA: Diagnosis not present

## 2022-03-24 DIAGNOSIS — E1169 Type 2 diabetes mellitus with other specified complication: Secondary | ICD-10-CM | POA: Diagnosis not present

## 2022-03-24 DIAGNOSIS — N189 Chronic kidney disease, unspecified: Secondary | ICD-10-CM | POA: Diagnosis not present

## 2022-03-24 DIAGNOSIS — E039 Hypothyroidism, unspecified: Secondary | ICD-10-CM | POA: Diagnosis not present

## 2022-04-23 DIAGNOSIS — I10 Essential (primary) hypertension: Secondary | ICD-10-CM | POA: Diagnosis not present

## 2022-04-23 DIAGNOSIS — E039 Hypothyroidism, unspecified: Secondary | ICD-10-CM | POA: Diagnosis not present

## 2022-04-23 DIAGNOSIS — E1169 Type 2 diabetes mellitus with other specified complication: Secondary | ICD-10-CM | POA: Diagnosis not present

## 2022-04-23 DIAGNOSIS — Z6822 Body mass index (BMI) 22.0-22.9, adult: Secondary | ICD-10-CM | POA: Diagnosis not present

## 2022-04-23 DIAGNOSIS — E042 Nontoxic multinodular goiter: Secondary | ICD-10-CM | POA: Diagnosis not present

## 2022-04-23 DIAGNOSIS — N189 Chronic kidney disease, unspecified: Secondary | ICD-10-CM | POA: Diagnosis not present

## 2022-04-23 DIAGNOSIS — Z8639 Personal history of other endocrine, nutritional and metabolic disease: Secondary | ICD-10-CM | POA: Diagnosis not present

## 2022-05-01 ENCOUNTER — Other Ambulatory Visit: Payer: Self-pay | Admitting: Cardiology

## 2022-05-01 DIAGNOSIS — I4821 Permanent atrial fibrillation: Secondary | ICD-10-CM

## 2022-06-12 ENCOUNTER — Ambulatory Visit: Payer: Medicare Other | Admitting: Cardiology

## 2022-06-18 DIAGNOSIS — H903 Sensorineural hearing loss, bilateral: Secondary | ICD-10-CM | POA: Diagnosis not present

## 2022-06-19 DIAGNOSIS — L299 Pruritus, unspecified: Secondary | ICD-10-CM | POA: Diagnosis not present

## 2022-06-19 DIAGNOSIS — H903 Sensorineural hearing loss, bilateral: Secondary | ICD-10-CM | POA: Diagnosis not present

## 2022-06-20 ENCOUNTER — Encounter: Payer: Self-pay | Admitting: Cardiology

## 2022-06-20 ENCOUNTER — Ambulatory Visit: Payer: Medicare Other | Admitting: Cardiology

## 2022-06-20 VITALS — BP 121/75 | HR 102 | Temp 97.7°F | Resp 16 | Ht 62.0 in | Wt 120.2 lb

## 2022-06-20 DIAGNOSIS — I2510 Atherosclerotic heart disease of native coronary artery without angina pectoris: Secondary | ICD-10-CM

## 2022-06-20 DIAGNOSIS — I10 Essential (primary) hypertension: Secondary | ICD-10-CM

## 2022-06-20 DIAGNOSIS — E7800 Pure hypercholesterolemia, unspecified: Secondary | ICD-10-CM | POA: Diagnosis not present

## 2022-06-20 DIAGNOSIS — I4821 Permanent atrial fibrillation: Secondary | ICD-10-CM | POA: Diagnosis not present

## 2022-06-20 MED ORDER — METOPROLOL TARTRATE 50 MG PO TABS: 25.0000 mg | ORAL_TABLET | Freq: Every day | ORAL | 1 refills | Status: DC

## 2022-06-20 MED ORDER — METOPROLOL SUCCINATE ER 50 MG PO TB24: 50.0000 mg | ORAL_TABLET | Freq: Every day | ORAL | 3 refills | Status: DC

## 2022-06-20 MED ORDER — ROSUVASTATIN CALCIUM 20 MG PO TABS: 20.0000 mg | ORAL_TABLET | Freq: Every day | ORAL | 1 refills | Status: DC

## 2022-06-21 LAB — LIPID PANEL WITH LDL/HDL RATIO
Cholesterol, Total: 93 mg/dL — ABNORMAL LOW (ref 100–199)
HDL: 43 mg/dL (ref >39)
LDL Chol Calc (NIH): 32 mg/dL (ref 0–99)
LDL/HDL Ratio: 0.7 ratio (ref 0.0–3.2)
Triglycerides: 90 mg/dL (ref 0–149)
VLDL Cholesterol Cal: 18 mg/dL (ref 5–40)

## 2022-06-21 LAB — LDL CHOLESTEROL, DIRECT: LDL Direct: 33 mg/dL (ref 0–99)

## 2022-07-23 DIAGNOSIS — E1169 Type 2 diabetes mellitus with other specified complication: Secondary | ICD-10-CM | POA: Diagnosis not present

## 2022-07-23 DIAGNOSIS — I10 Essential (primary) hypertension: Secondary | ICD-10-CM | POA: Diagnosis not present

## 2022-07-23 DIAGNOSIS — E1165 Type 2 diabetes mellitus with hyperglycemia: Secondary | ICD-10-CM | POA: Diagnosis not present

## 2022-07-23 DIAGNOSIS — N189 Chronic kidney disease, unspecified: Secondary | ICD-10-CM | POA: Diagnosis not present

## 2022-07-23 DIAGNOSIS — E039 Hypothyroidism, unspecified: Secondary | ICD-10-CM | POA: Diagnosis not present

## 2022-07-31 ENCOUNTER — Other Ambulatory Visit: Payer: Self-pay | Admitting: *Deleted

## 2022-07-31 DIAGNOSIS — D3A090 Benign carcinoid tumor of the bronchus and lung: Secondary | ICD-10-CM

## 2022-07-31 MED ORDER — STIOLTO RESPIMAT 2.5-2.5 MCG/ACT IN AERS: 2.0000 | INHALATION_SPRAY | Freq: Every day | RESPIRATORY_TRACT | 3 refills | Status: DC

## 2022-08-04 ENCOUNTER — Encounter: Payer: Self-pay | Admitting: Cardiology

## 2022-08-04 ENCOUNTER — Ambulatory Visit: Payer: Medicare Other | Admitting: Cardiology

## 2022-08-04 VITALS — BP 120/75 | HR 85 | Temp 98.8°F | Resp 18 | Ht 62.0 in | Wt 125.2 lb

## 2022-08-04 DIAGNOSIS — I2510 Atherosclerotic heart disease of native coronary artery without angina pectoris: Secondary | ICD-10-CM

## 2022-08-04 DIAGNOSIS — Z006 Encounter for examination for normal comparison and control in clinical research program: Secondary | ICD-10-CM | POA: Diagnosis not present

## 2022-08-04 DIAGNOSIS — I10 Essential (primary) hypertension: Secondary | ICD-10-CM

## 2022-08-04 DIAGNOSIS — I4821 Permanent atrial fibrillation: Secondary | ICD-10-CM | POA: Diagnosis not present

## 2022-08-04 MED ORDER — APIXABAN 2.5 MG PO TABS: 2.5000 mg | ORAL_TABLET | Freq: Two times a day (BID) | ORAL | 0 refills | Status: DC

## 2022-08-04 NOTE — Progress Notes (Signed)
Primary Physician/Referring:  [name], FNP  Patient ID: [name], female    DOB: Sep 16, 1934, 88 y.o.   MRN: [id number]  No chief complaint on file.  HPI: [name]  is a 88 y.o. Caucasian female patient with rare form of low-grade lung carcinoid, permanent atrial fibrillation, moderate diffuse coronary artery disease, asymptomatic right subclavian artery stenosis, hypertension, type II diabetes mellitus, hyperlipidemia.   Patient presents for 6-week follow-up for atrial fibrillation, hypertension, and CAD.  She is accompanied by her son today.  Medication reconciliation was extremely difficult and I spent personal time looking at each of the medications again.  On her last office visit I have advised her to bring all her medications.    She denies chest pain or palpitations.  She does have mild chronic dyspnea that is unchanged.  No PND or orthopnea or leg edema.  She is complaining of difficulty swallowing.     Past Medical History:  Diagnosis Date   Acute diverticulitis 06/2017   Anemia 06/2017   ARF (acute renal failure) ([place]) 06/2017   CAD (coronary artery disease) 03/13/2012   Diabetes mellitus    Dysrhythmia    ATRIAL FIB   Fall 07/07/2017   HTN (hypertension) 03/13/2012   Hypothyroid 03/13/2012   Lung cancer ([place])    Shortness of breath    Stroke ([place])    Syncope and collapse 04/02/2016   Type II or unspecified type diabetes mellitus without mention of complication, not stated as uncontrolled 03/13/2012   Past Surgical History:  Procedure Laterality Date   ABDOMINAL HYSTERECTOMY     BIOPSY  06/13/2021   Procedure: BIOPSY;  Surgeon: Ronnette Juniper, MD;  Location: [facility];  Service: Gastroenterology;;   BREAST EXCISIONAL BIOPSY Left    BREAST SURGERY     benign tumors removed in [place]  04/20/2012   Procedure: CARDIOVERSION;  Surgeon: Laverda Page, MD;  Location: [place];  Service: Cardiovascular;   Laterality: N/A;   CARDIOVERSION N/A 05/10/2013   Procedure: CARDIOVERSION;  Surgeon: Laverda Page, MD;  Location: [place];  Service: Cardiovascular;  Laterality: N/A;   CHOLECYSTECTOMY     COLONOSCOPY WITH PROPOFOL N/A 06/13/2021   Procedure: COLONOSCOPY WITH PROPOFOL;  Surgeon: Ronnette Juniper, MD;  Location: [facility];  Service: Gastroenterology;  Laterality: N/A;   ESOPHAGOGASTRODUODENOSCOPY (EGD) WITH PROPOFOL N/A 06/13/2021   Procedure: ESOPHAGOGASTRODUODENOSCOPY (EGD) WITH PROPOFOL;  Surgeon: Ronnette Juniper, MD;  Location: [facility];  Service: Gastroenterology;  Laterality: N/A;   HEMORRHOID SURGERY     jaw replacement     LEFT HEART CATHETERIZATION WITH CORONARY ANGIOGRAM N/A 04/20/2015   Procedure: LEFT HEART CATHETERIZATION WITH CORONARY ANGIOGRAM;  Surgeon: Adrian Prows, MD;  Location[facility];  Service: Cardiovascular;  Laterality: N/A;   tumors     back of head   Family History  Problem Relation Age of Onset   Breast cancer Mother    Aneurysm Father        Likely thoracic aneurysm per patient   Melanoma Brother    Heart disease Sister    Diabetes type II Sister    Social History   Tobacco Use   Smoking status: Never   Smokeless tobacco: Never  Substance Use Topics   Alcohol use: No    Review of Systems  Cardiovascular:  Positive for dyspnea on exertion (chronic, stable). Negative for chest pain and leg swelling.  Musculoskeletal:  Positive for back pain  and falls.  Gastrointestinal:  Negative for hematochezia and melena.    Objective  There were no vitals taken for this visit. There is no height or weight on file to calculate BMI.      06/20/2022    1:34 PM 02/27/2022   11:12 AM 01/24/2022   10:26 AM  Vitals with BMI  Height _0  _1  _2   Weight 120 lbs 3 oz 120 lbs 123 lbs 6 oz  BMI 21.98 94.17 40.81  Systolic 448 185 631  Diastolic 75 72 68  Pulse 497 85 102    No data found.    Physical Exam Vitals reviewed.  Constitutional:       General: She is not in acute distress.    Comments: Moderately built  Neck:     Vascular: Carotid bruit (left carotid) present. No JVD.  Cardiovascular:     Rate and Rhythm: Normal rate. Rhythm irregular.     Pulses:          Femoral pulses are 2+ on the right side and 2+ on the left side.      Popliteal pulses are 0 on the right side and 2+ on the left side.       Dorsalis pedis pulses are 1+ on the right side and 2+ on the left side.       Posterior tibial pulses are 0 on the right side and 0 on the left side.     Heart sounds: S1 normal and S2 normal. Heart sounds are distant. Murmur heard.     High-pitched midsystolic murmur is present with a grade of 2/6 at the upper right sternal border and apex.     No gallop. No S3 or S4 sounds.  Pulmonary:     Effort: Pulmonary effort is normal. No respiratory distress.     Breath sounds: Wheezing (bilateral bases) present. No rhonchi or rales.  Musculoskeletal:     Right lower leg: No edema.     Left lower leg: No edema.  Skin:    Capillary Refill: Capillary refill takes less than 2 seconds.    Laboratory examination:    Lipid Panel Recent Labs    06/20/22 1451  CHOL 93*  TRIG 90  LDLCALC 32  HDL 43  LDLDIRECT 33     External labs:  Labs 02/25/2022:  A1c 12.5%.  TSH 2.1, normal.  BUN 28, creatinine 1.57, EGFR 30 mL, potassium 4.4.  Labs 07/17/2021:  Hb 10.5/HCT 33.0, platelets 113.  Normal indicis.  Hemoglobin 11.600 g/d 08/28/2021 Creatinine, Serum 1.600 mg/ 06/05/2021 Potassium 3.800 mm 06/05/2021 Magnesium N/D ALT (SGPT) 19.000 U/L 06/05/2021  TSH 1.250 10/28/2021   Allergies   Allergies  Allergen Reactions   Codeine Nausea And Vomiting   Hydrocodone-Acetaminophen Nausea And Vomiting     Final Medications at End of Visit     Current Outpatient Medications:    acetaminophen (TYLENOL) 500 MG tablet, Take 500-1,000 mg by mouth every 6 (six) hours as needed for moderate pain., Disp: , Rfl:    B Complex-C  (B-COMPLEX WITH VITAMIN C) tablet, Take 1 tablet by mouth daily., Disp: , Rfl:    benzonatate (TESSALON) 100 MG capsule, Take 1 capsule by mouth every 8 (eight) hours for cough., Disp: 21 capsule, Rfl: 0   betamethasone dipropionate 0.05 % lotion, Apply 1 application topically 2 (two) times daily as needed (rash)., Disp: , Rfl:    Calcium Carb-Cholecalciferol (CALCIUM 600+D) 600-800 MG-UNIT TABS, Take 1 tablet by mouth daily.,  Disp: , Rfl:    Cholecalciferol (D3-50 PO), Take 12.5 mg by mouth daily., Disp: , Rfl:    diclofenac Sodium (VOLTAREN) 1 % GEL, Apply 1 application topically 4 (four) times daily as needed (knee pain)., Disp: , Rfl:    DILT-XR 180 MG 24 hr capsule, TAKE 1 CAPSULE(180 MG) BY MOUTH DAILY, Disp: 90 capsule, Rfl: 3   famotidine (PEPCID) 20 MG tablet, Take 1 tablet (20 mg total) by mouth at bedtime., Disp: 30 tablet, Rfl: 11   hydrochlorothiazide (MICROZIDE) 12.5 MG capsule, Take 12.5 mg by mouth daily., Disp: , Rfl:    Ichthammol 10 % OINT, Apply 1 application topically daily as needed (boils)., Disp: , Rfl:    insulin degludec (TRESIBA FLEXTOUCH) 100 UNIT/ML FlexTouch Pen, daily., Disp: , Rfl:    Insulin Pen Needle (NOVOFINE PLUS PEN NEEDLE) 32G X 4 MM MISC, ONE, Disp: , Rfl:    levothyroxine (SYNTHROID, LEVOTHROID) 100 MCG tablet, Take 1 tablet (100 mcg total) by mouth daily before breakfast. (Patient taking differently: Take 150 mcg by mouth daily before breakfast.), Disp: 30 tablet, Rfl: 3   metoprolol succinate (TOPROL-XL) 50 MG 24 hr tablet, Take 1 tablet (50 mg total) by mouth daily., Disp: 90 tablet, Rfl: 3   mupirocin ointment (BACTROBAN) 2 %, Apply 1 application topically 2 (two) times daily as needed (sores)., Disp: , Rfl:    pantoprazole (PROTONIX) 40 MG tablet, Take 40 mg by mouth 2 (two) times daily before a meal., Disp: , Rfl:    Rivaroxaban (XARELTO) 15 MG TABS tablet, Take 15 mg by mouth 2 (two) times daily with a meal., Disp: , Rfl:    rosuvastatin (CRESTOR) 20  MG tablet, Take 1 tablet (20 mg total) by mouth daily., Disp: 90 tablet, Rfl: 1   Tiotropium Bromide-Olodaterol (STIOLTO RESPIMAT) 2.5-2.5 MCG/ACT AERS, Inhale 2 puffs into the lungs daily., Disp: 12 g, Rfl: 3   Radiology   Bilateral ribs and chest 4 view 08/28/2021: No fracture or other bone lesions are seen involving the ribs. There is no evidence of pneumothorax.  Chronic interstitial changes. Trace right pleural effusion. Heart size and mediastinal contours are within normal limits.  Cardiac Studies:   Coronary angiogram  06/24/2016 Mild pulmonary hypertension suggestive of primary pulmonary hypertension, mean PA pressure of 26 mmHg.  proximal LAD revealing a 50% to 60% stenosis and calcified, No significant change from 04/20/2015.  Mild aortic stenosis with a 12 mm pressure gradient across the aortic valve.  Lexiscan myoview stress test 04/26/2018:  1. Lexiscan stress test performed. Exercise capacity not assessed. Stress symptoms included dizziness and chest pain/. Normal blood pressure. The resting electrocardiogram demonstrated atrial fibrillation with rapid ventricular rate, normal resting conduction and nonspecific ST-T changes.  Stress EKG is non diagnostic for ischemia as it is a pharmacologic stress.  2. The overall quality of the study is excellent. There is no evidence of abnormal lung activity. Stress and rest SPECT images demonstrate homogeneous tracer distribution throughout the myocardium. Gated SPECT imaging reveals normal myocardial thickening and wall motion. The left ventricular ejection fraction was normal (65%).   3. Low risk study.   Echocardiogram 03/28/2020: Left ventricle cavity is normal in size. Mild concentric hypertrophy of the left ventricle. Normal global wall motion. Normal LV systolic function with EF 55%. Unable to evaluate diastolic function due to atrial fibrillation. Left atrial cavity is moderately dilated. Trileaflet aortic valve with moderate aortic valve  leaflet calcification. At least moderate aortic stenosis. Aortic valve mean gradient of 15  mmHg, Vmax of 2.6  m/s. Calculated aortic valve area by continuity equation is 0.8 cm. Dimensionless index of 0.23. Significant discrepancy between AVA and mean PG, Vmax. Consider alternate noninvasive/ invasive modality for aortic stenosis assessment, as clinically indicated.  Moderate (Grade II) aortic regurgitation. Moderate (Grade III) mitral regurgitation. Moderate tricuspid regurgitation. Estimated pulmonary artery systolic pressure is 27 mmHg. Mild pulmonic regurgitation. Compared to previous study in 2018, no signifciant changes noted in aortic valve. PASP reduced from 46 mmHg then to 27 mmHg now.   Nocturnal oximetry 10/04/2020: SPO2 <88% was 5.4 minutes and <89% was 9.3 minutes, consecutive <88% 1 minute.  Awake SPO2 92%. Oxygen desaturation events 24, index 30. Lowest SPO2 80%, highest SpO2 99%.  Impression: Very mild abnormality with SPO2 less than 88% for 5 minutes.  Awake SPO2 greater than or equal to 92%.  Although patient qualifies for nocturnal oxygen supplementation, not convinced it would be helpful.   EKG   EKG 08/04/2022: Atrial fibrillation with controlled ventricular response at rate of 94 bpm, right axis deviation, left posterior fascicular block.  Poor R wave progression, cannot exclude anteroseptal infarct old.  Low-voltage complexes.  Pulmonary disease pattern.  Compared to prior EKG, right axis deviation is new.  Assessment     ICD-10-CM   1. Coronary artery disease involving native coronary artery of native heart without angina pectoris  I25.10     2. Permanent atrial fibrillation (HCC)  I48.21     3. Primary hypertension  I10       CHA2DS2-VASc Score is 6.  Yearly risk of stroke: 9.8% (F, A, HTN, DM, Vasc Dz).  Score of 1=0.6; 2=2.2; 3=3.2; 4=4.8; 5=7.2; 6=9.8; 7=>9.8) -(CHF; HTN; vasc disease DM,  Female = 1; Age <65 =0; 65-74 = 1,  >75 =2; stroke/embolism= 2).    No  orders of the defined types were placed in this encounter.   There are no discontinued medications.   No orders of the defined types were placed in this encounter.   Recommendations:   [name]  is a 88 y.o. female Caucasian patient with rare form of low-grade lung carcinoid, permanent atrial fibrillation, moderate diffuse coronary artery disease, asymptomatic right subclavian artery stenosis, hypertension, type II diabetes mellitus, hyperlipidemia.   Patient presents for 6-week follow-up for atrial fibrillation, hypertension, and CAD.  She is accompanied by her son today.  Medication reconciliation was extremely difficult and I spent personal time looking at each of the medications again.  On her last office visit I have advised her to bring all her medications.  I have also given her clear-cut instructions of increasing the metoprolol succinate to 50 mg and stopping the amiodarone to see whether her tremors will improve.  However patient has discontinued Xarelto in spite of son being there and clear-cut instructions given to the patient.  Today I have started her on Eliquis, samples given, patient will be screened into OCEANIC-AF (Asundexian - factor XIa inhibitor PO BID vs Apixaban PO BID in patients with A. Fib for stroke prevention.   Blood pressure is well controlled.  In view of difficulty with medication reconciliation, I counseled her and her son regarding maintaining medication list and to bring all her oral medications as well.  I will be seeing her back in 2 weeks with repeat EKG.  As long as the heart rate is controlled I would like to permanently discontinue amiodarone as she has some issues with interaction with her thyroid problems as well.  She has some  difficulty swallowing and advised them to discuss this with her PCP.   Adrian Prows, MD, [facility] 08/04/2022, 8:32 AM Office: [phone] Fax: [phone] Pager: [phone]

## 2022-08-05 DIAGNOSIS — E1169 Type 2 diabetes mellitus with other specified complication: Secondary | ICD-10-CM | POA: Diagnosis not present

## 2022-08-05 DIAGNOSIS — I25759 Atherosclerosis of native coronary artery of transplanted heart with unspecified angina pectoris: Secondary | ICD-10-CM | POA: Diagnosis not present

## 2022-08-05 DIAGNOSIS — Z8639 Personal history of other endocrine, nutritional and metabolic disease: Secondary | ICD-10-CM | POA: Diagnosis not present

## 2022-08-05 DIAGNOSIS — I10 Essential (primary) hypertension: Secondary | ICD-10-CM | POA: Diagnosis not present

## 2022-08-05 DIAGNOSIS — I4891 Unspecified atrial fibrillation: Secondary | ICD-10-CM | POA: Diagnosis not present

## 2022-08-05 DIAGNOSIS — E039 Hypothyroidism, unspecified: Secondary | ICD-10-CM | POA: Diagnosis not present

## 2022-08-05 DIAGNOSIS — E7800 Pure hypercholesterolemia, unspecified: Secondary | ICD-10-CM | POA: Diagnosis not present

## 2022-08-20 ENCOUNTER — Ambulatory Visit: Payer: Medicare Other | Admitting: Cardiology

## 2022-08-20 ENCOUNTER — Encounter: Payer: Self-pay | Admitting: Cardiology

## 2022-08-20 VITALS — BP 132/80 | HR 81 | Temp 97.3°F | Resp 16 | Ht 62.0 in | Wt 121.1 lb

## 2022-08-20 DIAGNOSIS — Z006 Encounter for examination for normal comparison and control in clinical research program: Secondary | ICD-10-CM | POA: Diagnosis not present

## 2022-08-20 DIAGNOSIS — I10 Essential (primary) hypertension: Secondary | ICD-10-CM | POA: Diagnosis not present

## 2022-08-20 DIAGNOSIS — I2510 Atherosclerotic heart disease of native coronary artery without angina pectoris: Secondary | ICD-10-CM

## 2022-08-20 DIAGNOSIS — I4821 Permanent atrial fibrillation: Secondary | ICD-10-CM

## 2022-08-20 NOTE — Progress Notes (Signed)
Primary Physician/Referring:  [name], FNP  Patient ID: [name], female    DOB: 1934-07-20, 88 y.o.   MRN: [id number]  Chief Complaint  Patient presents with   Coronary Artery Disease   Atrial Fibrillation   HPI: [name]  is a 88 y.o. Caucasian female patient with rare form of low-grade lung carcinoid, permanent atrial fibrillation, moderate diffuse coronary artery disease, asymptomatic right subclavian artery stenosis, hypertension, type II diabetes mellitus, hyperlipidemia.   Patient presents for 6-week follow-up for atrial fibrillation, hypertension, and CAD.  She is accompanied by her son today.  Medication reconciliation was done today finally.   She denies chest pain or palpitations.  She does have mild chronic dyspnea that is unchanged.  No PND or orthopnea or leg edema.  She is complaining of difficulty swallowing.     On her last office visit, I discussed with her regarding [employer]-AF trial which she is interested in.  She is presently on Eliquis.  Xarelto was held.  No new complaints.  Past Medical History:  Diagnosis Date   Acute diverticulitis 06/2017   Anemia 06/2017   ARF (acute renal failure) ([place]) 06/2017   CAD (coronary artery disease) 03/13/2012   Diabetes mellitus    Dysrhythmia    ATRIAL FIB   Fall 07/07/2017   HTN (hypertension) 03/13/2012   Hypothyroid 03/13/2012   Lung cancer ([place])    Shortness of breath    Stroke ([place])    Syncope and collapse 04/02/2016   Type II or unspecified type diabetes mellitus without mention of complication, not stated as uncontrolled 03/13/2012   Past Surgical History:  Procedure Laterality Date   ABDOMINAL HYSTERECTOMY     BIOPSY  06/13/2021   Procedure: BIOPSY;  Surgeon: Ronnette Juniper, MD;  Location: [facility];  Service: Gastroenterology;;   BREAST EXCISIONAL BIOPSY Left    BREAST SURGERY     benign tumors removed in [place]  04/20/2012   Procedure:  CARDIOVERSION;  Surgeon: Laverda Page, MD;  Location: [facility];  Service: Cardiovascular;  Laterality: N/A;   CARDIOVERSION N/A 05/10/2013   Procedure: CARDIOVERSION;  Surgeon: Laverda Page, MD;  Location: [facility];  Service: Cardiovascular;  Laterality: N/A;   CHOLECYSTECTOMY     COLONOSCOPY WITH PROPOFOL N/A 06/13/2021   Procedure: COLONOSCOPY WITH PROPOFOL;  Surgeon: Ronnette Juniper, MD;  Location: [facility];  Service: Gastroenterology;  Laterality: N/A;   ESOPHAGOGASTRODUODENOSCOPY (EGD) WITH PROPOFOL N/A 06/13/2021   Procedure: ESOPHAGOGASTRODUODENOSCOPY (EGD) WITH PROPOFOL;  Surgeon: Ronnette Juniper, MD;  Location: [facility];  Service: Gastroenterology;  Laterality: N/A;   HEMORRHOID SURGERY     jaw replacement     LEFT HEART CATHETERIZATION WITH CORONARY ANGIOGRAM N/A 04/20/2015   Procedure: LEFT HEART CATHETERIZATION WITH CORONARY ANGIOGRAM;  Surgeon: Adrian Prows, MD;  Location: [facility];  Service: Cardiovascular;  Laterality: N/A;   tumors     back of head   Family History  Problem Relation Age of Onset   Breast cancer Mother    Aneurysm Father        Likely thoracic aneurysm per patient   Hypertension Sister    Heart disease Sister    Diabetes type II Sister    Diabetes type II Sister    Diverticulitis Sister    Diabetes type II Sister    Stroke Sister    Heart disease Brother    Melanoma Brother    Melanoma Brother  Social History   Tobacco Use   Smoking status: Never   Smokeless tobacco: Never  Substance Use Topics   Alcohol use: No    Review of Systems  Cardiovascular:  Positive for dyspnea on exertion (chronic, stable). Negative for chest pain and leg swelling.  Musculoskeletal:  Positive for back pain and falls.  Gastrointestinal:  Negative for hematochezia and melena.    Objective  Blood pressure 132/80, pulse 81, temperature (!) 97.3 F (36.3 C), temperature source Temporal, resp. rate 16, height $RemoveBe'5\' 2"'tmpbhuAUI$  (1.575 m), weight 121 lb 1.6 oz (54.9 kg),  SpO2 95 %. Body mass index is 22.15 kg/m.      08/20/2022    9:50 AM 08/04/2022    8:40 AM 06/20/2022    1:34 PM  Vitals with BMI  Height $Remov'5\' 2"'sYnvrg$  $Remove'5\' 2"'CXYHxRb$  $RemoveB'5\' 2"'VpRHUFKd$   Weight 121 lbs 2 oz 125 lbs 3 oz 120 lbs 3 oz  BMI 22.14 85.46 27.03  Systolic 500 938 182  Diastolic 80 75 75  Pulse 81 85 102    Orthostatic VS for the past 72 hrs (Last 3 readings):  Patient Position BP Location Cuff Size  08/20/22 0950 Sitting Left Arm Normal     Physical Exam Vitals reviewed.  Constitutional:      General: She is not in acute distress.    Comments: Moderately built  Neck:     Vascular: Carotid bruit (left carotid) present. No JVD.  Cardiovascular:     Rate and Rhythm: Normal rate. Rhythm irregular.     Pulses:          Femoral pulses are 2+ on the right side and 2+ on the left side.      Popliteal pulses are 0 on the right side and 2+ on the left side.       Dorsalis pedis pulses are 1+ on the right side and 2+ on the left side.       Posterior tibial pulses are 0 on the right side and 0 on the left side.     Heart sounds: S1 normal and S2 normal. Heart sounds are distant. Murmur heard.     High-pitched midsystolic murmur is present with a grade of 2/6 at the upper right sternal border and apex.     No gallop. No S3 or S4 sounds.  Pulmonary:     Effort: Pulmonary effort is normal. No respiratory distress.     Breath sounds: Wheezing (bilateral bases) present. No rhonchi or rales.  Musculoskeletal:     Right lower leg: No edema.     Left lower leg: No edema.  Skin:    Capillary Refill: Capillary refill takes less than 2 seconds.    Laboratory examination:    Lipid Panel Recent Labs    06/20/22 1451  CHOL 93*  TRIG 90  LDLCALC 32  HDL 43  LDLDIRECT 33    External labs:  Labs 02/25/2022:  A1c 12.5%.  TSH 2.1, normal.  BUN 28, creatinine 1.57, EGFR 30 mL, potassium 4.4.  Labs 07/17/2021:  Hb 10.5/HCT 33.0, platelets 113.  Normal indicis.  Hemoglobin 11.600 g/d  08/28/2021 Creatinine, Serum 1.600 mg/ 06/05/2021 Potassium 3.800 mm 06/05/2021 Magnesium N/D ALT (SGPT) 19.000 U/L 06/05/2021  TSH 1.250 10/28/2021   Allergies   Allergies  Allergen Reactions   Codeine Nausea And Vomiting   Hydrocodone-Acetaminophen Nausea And Vomiting     Final Medications at End of Visit     Current Outpatient Medications:    acetaminophen (TYLENOL) 500  MG tablet, Take 500-1,000 mg by mouth every 6 (six) hours as needed for moderate pain., Disp: , Rfl:    apixaban (ELIQUIS) 2.5 MG TABS tablet, Take 1 tablet (2.5 mg total) by mouth 2 (two) times daily., Disp: 28 tablet, Rfl: 0   B Complex-C (B-COMPLEX WITH VITAMIN C) tablet, Take 1 tablet by mouth daily., Disp: , Rfl:    calcium carbonate (TUMS) 500 MG chewable tablet, Chew 1 tablet by mouth daily., Disp: , Rfl:    Calcium Citrate-Vitamin D ([employer] CALCIUM CITRATE +D3 MINI PO), Take 1 capsule by mouth daily., Disp: , Rfl:    DILT-XR 180 MG 24 hr capsule, TAKE 1 CAPSULE(180 MG) BY MOUTH DAILY, Disp: 90 capsule, Rfl: 3   Fluocinolone Acetonide 0.01 % OIL, Place 5 drops in ear(s) as needed., Disp: , Rfl:    insulin degludec (TRESIBA FLEXTOUCH) 100 UNIT/ML FlexTouch Pen, daily., Disp: , Rfl:    Insulin Pen Needle (NOVOFINE PLUS PEN NEEDLE) 32G X 4 MM MISC, ONE, Disp: , Rfl:    levothyroxine (SYNTHROID) 88 MCG tablet, Take 88 mcg by mouth daily., Disp: , Rfl:    metoprolol succinate (TOPROL-XL) 50 MG 24 hr tablet, Take 1 tablet (50 mg total) by mouth daily., Disp: 90 tablet, Rfl: 3   mupirocin ointment (BACTROBAN) 2 %, Apply 1 application topically 2 (two) times daily as needed (sores)., Disp: , Rfl:    rosuvastatin (CRESTOR) 20 MG tablet, Take 1 tablet (20 mg total) by mouth daily., Disp: 90 tablet, Rfl: 1   Tiotropium Bromide-Olodaterol (STIOLTO RESPIMAT) 2.5-2.5 MCG/ACT AERS, Inhale 2 puffs into the lungs daily., Disp: 12 g, Rfl: 3   Radiology   Bilateral ribs and chest 4 view 08/28/2021: No fracture or other bone lesions  are seen involving the ribs. There is no evidence of pneumothorax.  Chronic interstitial changes. Trace right pleural effusion. Heart size and mediastinal contours are within normal limits.  Cardiac Studies:   Coronary angiogram  06/24/2016 Mild pulmonary hypertension suggestive of primary pulmonary hypertension, mean PA pressure of 26 mmHg.  proximal LAD revealing a 50% to 60% stenosis and calcified, No significant change from 04/20/2015.  Mild aortic stenosis with a 12 mm pressure gradient across the aortic valve.  Lexiscan myoview stress test 04/26/2018:  1. Lexiscan stress test performed. Exercise capacity not assessed. Stress symptoms included dizziness and chest pain/. Normal blood pressure. The resting electrocardiogram demonstrated atrial fibrillation with rapid ventricular rate, normal resting conduction and nonspecific ST-T changes.  Stress EKG is non diagnostic for ischemia as it is a pharmacologic stress.  2. The overall quality of the study is excellent. There is no evidence of abnormal lung activity. Stress and rest SPECT images demonstrate homogeneous tracer distribution throughout the myocardium. Gated SPECT imaging reveals normal myocardial thickening and wall motion. The left ventricular ejection fraction was normal (65%).   3. Low risk study.   Echocardiogram 03/28/2020: Left ventricle cavity is normal in size. Mild concentric hypertrophy of the left ventricle. Normal global wall motion. Normal LV systolic function with EF 55%. Unable to evaluate diastolic function due to atrial fibrillation. Left atrial cavity is moderately dilated. Trileaflet aortic valve with moderate aortic valve leaflet calcification. At least moderate aortic stenosis. Aortic valve mean gradient of 15 mmHg, Vmax of 2.6  m/s. Calculated aortic valve area by continuity equation is 0.8 cm. Dimensionless index of 0.23. Significant discrepancy between AVA and mean PG, Vmax. Consider alternate noninvasive/ invasive  modality for aortic stenosis assessment, as clinically indicated.  Moderate (Grade  II) aortic regurgitation. Moderate (Grade III) mitral regurgitation. Moderate tricuspid regurgitation. Estimated pulmonary artery systolic pressure is 27 mmHg. Mild pulmonic regurgitation. Compared to previous study in 2018, no signifciant changes noted in aortic valve. PASP reduced from 46 mmHg then to 27 mmHg now.   Nocturnal oximetry 10/04/2020: SPO2 <88% was 5.4 minutes and <89% was 9.3 minutes, consecutive <88% 1 minute.  Awake SPO2 92%. Oxygen desaturation events 24, index 30. Lowest SPO2 80%, highest SpO2 99%.  Impression: Very mild abnormality with SPO2 less than 88% for 5 minutes.  Awake SPO2 greater than or equal to 92%.  Although patient qualifies for nocturnal oxygen supplementation, not convinced it would be helpful.   EKG  EKG 08/20/2022: Atrial fibrillation with controlled ventricular response at rate of 89 bpm, right axis deviation, left posterior fascicular block.  Incomplete right bundle branch block.  Poor R wave progression, cannot exclude anteroseptal infarct old.  Nonspecific T abnormality.  Compared to 08/04/2022, no significant change.   Assessment     ICD-10-CM   1. Coronary artery disease involving native coronary artery of native heart without angina pectoris  I25.10 EKG 12-Lead    2. Permanent atrial fibrillation (HCC)  I48.21     3. Primary hypertension  I10     4. Research subject OCEANIC-AF (Asundexian - factor XIa inhibitor PO BID vs Apixaban PO BID in patients with A. Fib for stroke prevention.   Z00.6       CHA2DS2-VASc Score is 6.  Yearly risk of stroke: 9.8% (F, A, HTN, DM, Vasc Dz).  Score of 1=0.6; 2=2.2; 3=3.2; 4=4.8; 5=7.2; 6=9.8; 7=>9.8) -(CHF; HTN; vasc disease DM,  Female = 1; Age <65 =0; 65-74 = 1,  >75 =2; stroke/embolism= 2).    No orders of the defined types were placed in this encounter.   Medications Discontinued During This Encounter  Medication Reason    Calcium Carb-Cholecalciferol (CALCIUM 600+D) 600-800 MG-UNIT TABS     Orders Placed This Encounter  Procedures   EKG 12-Lead    Recommendations:   [name]  is a 88 y.o. female Caucasian patient with rare form of low-grade lung carcinoid, permanent atrial fibrillation, moderate diffuse coronary artery disease, asymptomatic right subclavian artery stenosis, hypertension, type II diabetes mellitus, hyperlipidemia.   Patient presents for 6-week follow-up for atrial fibrillation, hypertension, and CAD.  She is accompanied by her son today.    Medication reconciliation done today. She is not no amiodarone and HR is well controlled. Hence will discontinue this.  BP is controlled and no CHF on exam.  Presently on Eliquis, patient will be screened into OCEANIC-AF (Asundexian - factor XIa inhibitor PO BID vs Apixaban PO BID in patients with A. Fib for stroke prevention.   She has some difficulty swallowing and advised them to discuss this with her PCP.  I will see her back in 6 months for follow-up.   Adrian Prows, MD, [facility] 08/20/2022, 11:08 AM Office: [phone] Fax: [phone] Pager: [phone]

## 2022-08-26 ENCOUNTER — Encounter: Payer: Self-pay | Admitting: Pulmonary Disease

## 2022-08-26 ENCOUNTER — Telehealth: Payer: Self-pay | Admitting: Pulmonary Disease

## 2022-08-26 ENCOUNTER — Ambulatory Visit (INDEPENDENT_AMBULATORY_CARE_PROVIDER_SITE_OTHER): Payer: Medicare Other | Admitting: Pulmonary Disease

## 2022-08-26 VITALS — BP 126/76 | HR 107 | Temp 79.9°F | Ht 62.0 in | Wt 120.6 lb

## 2022-08-26 DIAGNOSIS — D3A090 Benign carcinoid tumor of the bronchus and lung: Secondary | ICD-10-CM

## 2022-08-26 DIAGNOSIS — K219 Gastro-esophageal reflux disease without esophagitis: Secondary | ICD-10-CM

## 2022-08-26 MED ORDER — PANTOPRAZOLE SODIUM 40 MG PO TBEC: 40.0000 mg | DELAYED_RELEASE_TABLET | Freq: Every day | ORAL | 1 refills | Status: DC

## 2022-08-26 MED ORDER — STIOLTO RESPIMAT 2.5-2.5 MCG/ACT IN AERS: 2.0000 | INHALATION_SPRAY | Freq: Every day | RESPIRATORY_TRACT | 0 refills | Status: DC

## 2022-08-26 NOTE — Progress Notes (Signed)
Synopsis: Referred in January 2023 for dyspnea on exertion by Dr. Tamsen Roers, MD  Subjective:   PATIENT ID: [name] GENDER: female DOB: January 05, 1934, MRN: [id number]  HPI  Chief Complaint  Patient presents with   Follow-up    Breathing has improved some since the last visit.    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   She continues to have improvement in her breathing with stiolto. We reviewed how to use the inhaler today.   She complains of watery eyes and nose. She has issues with cough, and spitting up phlegm at night. She has issues with GERD and is taking TUMs as needed. She is sleeping upright in recliner at night and is eating well before bedtime.   OV 02/27/22 She reports improvement in her breathing with stiolto since last visit. She is having a productive cough.   PFTs today show moderate diffusion defect.   OV 01/24/22 She reports progressive shortness of breath over the past year. She is followed by Dr. Einar Gip of cardiology, note from 12/12/21 reviewed, with low concern for heart failure leading to her dyspnea. She uses a walker to ambulate and is now short of breath walking short distances. She denies wheezing with the shortness of breath. She does have a cough with sputum production that can be thick/stringy at times. She was previously using nocturnal oxygen per Dr. Maryjean Ka but self-discontinued this due to rising costs.   She is a never smoker but does have second hand smoke exposure from her father. She is accompanied by her son [name] today. She has not tried any inhalers for her shortness of breath. She lives alone. Denies history of occupational dust or chemical exposures.  She has history of GERD which is currently active with symptoms. She was previously on pepcid for  acid suppression which she tolerated well.  Note from Dr. Sharlet Salina at [facility] 02/07/2013: She had a chronic intermittent cough for three to four years with some dyspnea on exertion. A chest x-ray showed small nodules in both lungs. Subsequent CT scan was performed on 11-06-03, and this showed multiple subcentimeter and 1.0 to 2.0 cm nodules with two in the right middle lobe, three in the right lower lobe measuring roughly 1.8 cm and three nodules on the left side as well. All total there were almost a dozen nodules in both lungs combined. There were no other signs of disease within the chest, abdomen or pelvis. CT guided biopsy of the largest lesion in the right lower lobe was performed on 09-25-03. This caused hemoptysis which quickly resolved. Pathology returned as an epithelial tumor with neuroendocrine features but could not differentiate between atypical carcinoid or typical carcinoid . Her pathology was reviewed here and felt to be a low grade neuroendocrine neoplasm favoring carcinoid tumor ,TTF1 + . She has been followed without treatment with stable nodules.  Past Medical History:  Diagnosis Date   Acute diverticulitis 06/2017   Anemia 06/2017   ARF (acute renal failure) ([place]) 06/2017   CAD (coronary artery disease) 03/13/2012   Diabetes mellitus    Dysrhythmia    ATRIAL FIB   Fall 07/07/2017   HTN (hypertension) 03/13/2012   Hypothyroid 03/13/2012   Lung cancer ([place])    Shortness of breath    Stroke [facility])    Syncope and  collapse 04/02/2016   Type II or unspecified type diabetes mellitus without mention of complication, not stated as uncontrolled 03/13/2012     Family History  Problem Relation Age of Onset   Breast cancer Mother    Aneurysm Father        Likely thoracic aneurysm per patient   Hypertension Sister    Heart disease Sister    Diabetes type II Sister    Diabetes type II Sister    Diverticulitis Sister    Diabetes type II Sister    Stroke Sister    Heart  disease Brother    Melanoma Brother    Melanoma Brother      Social History   Socioeconomic History   Marital status: Widowed    Spouse name: Not on file   Number of children: 3   Years of education: Not on file   Highest education level: Not on file  Occupational History   Not on file  Tobacco Use   Smoking status: Never   Smokeless tobacco: Never  Vaping Use   Vaping Use: Never used  Substance and Sexual Activity   Alcohol use: No   Drug use: No   Sexual activity: Yes    Birth control/protection: None  Other Topics Concern   Not on file  Social History Narrative   Not on file   Social Determinants of Health   Financial Resource Strain: Not on file  Food Insecurity: Not on file  Transportation Needs: Not on file  Physical Activity: Not on file  Stress: Not on file  Social Connections: Not on file  Intimate Partner Violence: Not on file     Allergies  Allergen Reactions   Codeine Nausea And Vomiting   Hydrocodone-Acetaminophen Nausea And Vomiting     Outpatient Medications Prior to Visit  Medication Sig Dispense Refill   acetaminophen (TYLENOL) 500 MG tablet Take 500-1,000 mg by mouth every 6 (six) hours as needed for moderate pain.     B Complex-C (B-COMPLEX WITH VITAMIN C) tablet Take 1 tablet by mouth daily.     calcium carbonate (TUMS) 500 MG chewable tablet Chew 1 tablet by mouth daily.     Calcium Citrate-Vitamin D ([employer] CALCIUM CITRATE +D3 MINI PO) Take 1 capsule by mouth daily.     DILT-XR 180 MG 24 hr capsule TAKE 1 CAPSULE(180 MG) BY MOUTH DAILY 90 capsule 3   Fluocinolone Acetonide 0.01 % OIL Place 5 drops in ear(s) as needed.     insulin degludec (TRESIBA FLEXTOUCH) 100 UNIT/ML FlexTouch Pen daily.     Insulin Pen Needle (NOVOFINE PLUS PEN NEEDLE) 32G X 4 MM MISC ONE     levothyroxine (SYNTHROID) 88 MCG tablet Take 88 mcg by mouth daily.     metoprolol succinate (TOPROL-XL) 50 MG 24 hr tablet Take 1 tablet (50 mg total) by mouth daily. 90 tablet 3    mupirocin ointment (BACTROBAN) 2 % Apply 1 application topically 2 (two) times daily as needed (sores).     rosuvastatin (CRESTOR) 20 MG tablet Take 1 tablet (20 mg total) by mouth daily. 90 tablet 1   Tiotropium Bromide-Olodaterol (STIOLTO RESPIMAT) 2.5-2.5 MCG/ACT AERS Inhale 2 puffs into the lungs daily. 12 g 3   apixaban (ELIQUIS) 2.5 MG TABS tablet Take 1 tablet (2.5 mg total) by mouth 2 (two) times daily. 28 tablet 0   No facility-administered medications prior to visit.   Review of Systems  Constitutional:  Negative for chills, fever, malaise/fatigue and weight loss.  HENT:  Negative for congestion, sinus pain and sore throat.   Eyes: Negative.   Respiratory:  Positive for cough and shortness of breath. Negative for hemoptysis, sputum production and wheezing.   Cardiovascular:  Negative for chest pain, palpitations, orthopnea, claudication and leg swelling.  Gastrointestinal:  Negative for abdominal pain, heartburn, nausea and vomiting.  Genitourinary: Negative.   Musculoskeletal:  Negative for joint pain and myalgias.  Skin:  Negative for rash.  Neurological:  Negative for weakness.  Endo/Heme/Allergies: Negative.   Psychiatric/Behavioral: Negative.     Objective:   Vitals:   08/26/22 1037  BP: 126/76  Pulse: (!) 107  Temp: (!) 79.9 F (26.6 C)  TempSrc: Oral  SpO2: 99%  Weight: 120 lb 9.6 oz (54.7 kg)  Height: 5\' 2"  (1.575 m)   Physical Exam Constitutional:      General: She is not in acute distress.    Appearance: She is not ill-appearing.  HENT:     Head: Normocephalic and atraumatic.  Eyes:     General: No scleral icterus.    Conjunctiva/sclera: Conjunctivae normal.  Cardiovascular:     Rate and Rhythm: Normal rate and regular rhythm.     Pulses: Normal pulses.     Heart sounds: Normal heart sounds. No murmur heard. Pulmonary:     Effort: Pulmonary effort is normal.     Breath sounds: Normal breath sounds. Decreased air movement present. No wheezing,  rhonchi or rales.  Musculoskeletal:     Right lower leg: No edema.     Left lower leg: No edema.  Skin:    General: Skin is warm and dry.  Neurological:     General: No focal deficit present.     Mental Status: She is alert.  Psychiatric:        Mood and Affect: Mood normal.        Behavior: Behavior normal.        Thought Content: Thought content normal.        Judgment: Judgment normal.    CBC    Component Value Date/Time   WBC 5.1 09/17/2021 1316   WBC 4.7 08/16/2018 1313   RBC 4.29 09/17/2021 1316   RBC 3.66 (L) 08/16/2018 1313   HGB 11.7 09/17/2021 1316   HCT 36.7 09/17/2021 1316   PLT 128 (L) 09/17/2021 1316   MCV 86 09/17/2021 1316   MCH 27.3 09/17/2021 1316   MCH 29.2 08/16/2018 1313   MCHC 31.9 09/17/2021 1316   MCHC 32.5 08/16/2018 1313   RDW 16.0 (H) 09/17/2021 1316   LYMPHSABS 2.5 07/08/2017 0127   MONOABS 0.7 07/08/2017 0127   EOSABS 0.1 07/08/2017 0127   BASOSABS 0.0 07/08/2017 0127      Latest Ref Rng & Units 03/25/2021    2:19 PM 09/12/2019    9:57 AM 07/05/2019    8:27 AM  BMP  Glucose 65 - 99 mg/dL 173  128  116   BUN 8 - 27 mg/dL 29  16  25    Creatinine 0.57 - 1.00 mg/dL 1.40  1.49  1.53   BUN/Creat Ratio 12 - 28 21  11  16    Sodium 134 - 144 mmol/L 137  143  142   Potassium 3.5 - 5.2 mmol/L 5.0  3.5  4.8   Chloride 96 - 106 mmol/L 98  102  104   CO2 20 - 29 mmol/L 23  22  22    Calcium 8.7 - 10.3 mg/dL 9.6  9.1     Chest  imaging: CXR 12/30/21 The heart size and mediastinal contours are within normal limits. Nodular density is noted medially in right lung base consistent with metastatic disease as noted on recent CT scan. No acute pulmonary consolidation is noted. The visualized skeletal structures are unremarkable.  PFT:    Latest Ref Rng & Units 02/27/2022    9:40 AM  PFT Results  FVC-Pre L 1.54   FVC-Predicted Pre % 71   FVC-Post L 1.51   FVC-Predicted Post % 70   Pre FEV1/FVC % % 75   Post FEV1/FCV % % 77   FEV1-Pre L 1.15    FEV1-Predicted Pre % 73   FEV1-Post L 1.17   DLCO uncorrected ml/min/mmHg 9.34   DLCO UNC% % 54   DLCO corrected ml/min/mmHg 9.34   DLCO COR %Predicted % 54   DLVA Predicted % 64   TLC L 4.33   TLC % Predicted % 91   RV % Predicted % 102     Labs:  Path:  Echo: 01/15/22 LV EF 59%. Moderate aortic stenosis. Mild aortic regurge, mild mitral regurge. Mild tricuspid regurge. PASP 62mmHg.   Heart Catheterization 05/2016: Coronary angiogram: Mild to moderate diffuse coronary artery disease without high-grade stenosis. Proximal LAD with a 40-50% stenosis, in some views at most 50-60% stenosed and calcified. Small circumflex, mildly diseased large right coronary artery.   Aortic valve not crossed. Difficulty in crossing. Patient has history of mild aortic stenosis by echocardiogram April 2017.   Right heart catheterization revealing mild pulmonary hypertension, PA mean pressure of 26 mmHg, wedge mean was 13 mmHg. Cardiac output 3.7, cardiac index 2.3 by Fick. There was no significant shunt, QP: QS 0.79. RA mean 3 mmHg, RA saturation 59%.; RV 34/0, EDP 3 mmHg.; PTA 36/17, mean 26 mmHg. PA saturation 49%.; Pulmonary wedge mean of 13 mmHg.   Recommendation: Patient main complaint is dyspnea, consideration for pulmonary vasodilator therapy for pulmonary hypertension may be given in view of low wedge pressure and high PA mean pressure. Patient will be discharged home with outpatient follow-up.  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   She has nonspecific PFT  pattern with moderate diffusion defect. She is being treated for obstructive lung disease with Stiolto as she has noted benefit from this inhaler.   She has productive cough that could be related to GERD. She is to start pantoprazole 40mg  daily, 30 minutes before breakfast.   Follow up in 1 year.   Freda Jackson, MD [facility] Office: [phone]   Current Outpatient Medications:    acetaminophen (TYLENOL) 500 MG tablet, Take 500-1,000 mg by mouth every 6 (six) hours as needed for moderate pain., Disp: , Rfl:    B Complex-C (B-COMPLEX WITH VITAMIN C) tablet, Take 1 tablet by mouth daily., Disp: , Rfl:    calcium carbonate (TUMS) 500 MG chewable tablet, Chew 1 tablet by mouth daily., Disp: , Rfl:    Calcium Citrate-Vitamin D ([employer]S CALCIUM CITRATE +D3 MINI PO), Take 1 capsule by mouth daily., Disp: , Rfl:    DILT-XR 180 MG 24 hr capsule, TAKE 1 CAPSULE(180 MG) BY MOUTH DAILY, Disp:  90 capsule, Rfl: 3   Fluocinolone Acetonide 0.01 % OIL, Place 5 drops in ear(s) as needed., Disp: , Rfl:    insulin degludec (TRESIBA FLEXTOUCH) 100 UNIT/ML FlexTouch Pen, daily., Disp: , Rfl:    Insulin Pen Needle (NOVOFINE PLUS PEN NEEDLE) 32G X 4 MM MISC, ONE, Disp: , Rfl:    levothyroxine (SYNTHROID) 88 MCG tablet, Take 88 mcg by mouth daily., Disp: , Rfl:    metoprolol succinate (TOPROL-XL) 50 MG 24 hr tablet, Take 1 tablet (50 mg total) by mouth daily., Disp: 90 tablet, Rfl: 3   mupirocin ointment (BACTROBAN) 2 %, Apply 1 application topically 2 (two) times daily as needed (sores)., Disp: , Rfl:    rosuvastatin (CRESTOR) 20 MG tablet, Take 1 tablet (20 mg total) by mouth daily., Disp: 90 tablet, Rfl: 1   Tiotropium Bromide-Olodaterol (STIOLTO RESPIMAT) 2.5-2.5 MCG/ACT AERS, Inhale 2 puffs into the lungs daily., Disp: 12 g, Rfl: 3   pantoprazole (PROTONIX) 40 MG tablet, Take 1 tablet (40 mg total) by mouth daily., Disp: 90 tablet, Rfl: 1

## 2022-08-26 NOTE — Patient Instructions (Addendum)
Start pantoprazole 40mg  daily, 30 minutes before breakfast on empty stomach  Continue stiolto 2 puffs daily  Follow up in 1 year. Call sooner if you have increased symptoms.

## 2022-08-26 NOTE — Telephone Encounter (Signed)
Please have them list out all of the questions so I can respond. He should also review notes from Dr. Sharlet Salina at [facility].  Thanks, JD

## 2022-08-29 NOTE — Telephone Encounter (Signed)
LMTCB

## 2022-09-03 DIAGNOSIS — E039 Hypothyroidism, unspecified: Secondary | ICD-10-CM | POA: Diagnosis not present

## 2022-09-03 DIAGNOSIS — I10 Essential (primary) hypertension: Secondary | ICD-10-CM | POA: Diagnosis not present

## 2022-09-03 DIAGNOSIS — E7800 Pure hypercholesterolemia, unspecified: Secondary | ICD-10-CM | POA: Diagnosis not present

## 2022-09-03 DIAGNOSIS — E1169 Type 2 diabetes mellitus with other specified complication: Secondary | ICD-10-CM | POA: Diagnosis not present

## 2022-09-03 DIAGNOSIS — E1165 Type 2 diabetes mellitus with hyperglycemia: Secondary | ICD-10-CM | POA: Diagnosis not present

## 2022-10-21 ENCOUNTER — Other Ambulatory Visit: Payer: Self-pay

## 2022-10-21 DIAGNOSIS — I4821 Permanent atrial fibrillation: Secondary | ICD-10-CM

## 2022-10-21 MED ORDER — DILTIAZEM HCL ER 180 MG PO CP24: ORAL_CAPSULE | ORAL | 3 refills | Status: DC

## 2022-11-05 DIAGNOSIS — E789 Disorder of lipoprotein metabolism, unspecified: Secondary | ICD-10-CM | POA: Diagnosis not present

## 2022-11-05 DIAGNOSIS — E118 Type 2 diabetes mellitus with unspecified complications: Secondary | ICD-10-CM | POA: Diagnosis not present

## 2022-11-05 DIAGNOSIS — E039 Hypothyroidism, unspecified: Secondary | ICD-10-CM | POA: Diagnosis not present

## 2022-11-05 DIAGNOSIS — Z8639 Personal history of other endocrine, nutritional and metabolic disease: Secondary | ICD-10-CM | POA: Diagnosis not present

## 2022-11-05 DIAGNOSIS — N189 Chronic kidney disease, unspecified: Secondary | ICD-10-CM | POA: Diagnosis not present

## 2022-11-11 ENCOUNTER — Encounter (HOSPITAL_COMMUNITY): Payer: Self-pay | Admitting: Emergency Medicine

## 2022-11-11 ENCOUNTER — Other Ambulatory Visit: Payer: Self-pay

## 2022-11-11 ENCOUNTER — Inpatient Hospital Stay (HOSPITAL_COMMUNITY)
Admission: EM | Admit: 2022-11-11 | Discharge: 2022-11-26 | DRG: 291 | Disposition: A | Discharge status: Skilled Nursing Facility | Payer: Medicare Other | Attending: Internal Medicine | Admitting: Internal Medicine

## 2022-11-11 ENCOUNTER — Emergency Department (HOSPITAL_COMMUNITY): Payer: Medicare Other

## 2022-11-11 ENCOUNTER — Telehealth: Payer: Self-pay

## 2022-11-11 DIAGNOSIS — I2510 Atherosclerotic heart disease of native coronary artery without angina pectoris: Secondary | ICD-10-CM | POA: Diagnosis not present

## 2022-11-11 DIAGNOSIS — I350 Nonrheumatic aortic (valve) stenosis: Secondary | ICD-10-CM | POA: Diagnosis present

## 2022-11-11 DIAGNOSIS — I2721 Secondary pulmonary arterial hypertension: Secondary | ICD-10-CM | POA: Diagnosis not present

## 2022-11-11 DIAGNOSIS — L539 Erythematous condition, unspecified: Secondary | ICD-10-CM | POA: Diagnosis present

## 2022-11-11 DIAGNOSIS — Z9981 Dependence on supplemental oxygen: Secondary | ICD-10-CM | POA: Diagnosis not present

## 2022-11-11 DIAGNOSIS — Z6822 Body mass index (BMI) 22.0-22.9, adult: Secondary | ICD-10-CM

## 2022-11-11 DIAGNOSIS — I808 Phlebitis and thrombophlebitis of other sites: Secondary | ICD-10-CM | POA: Diagnosis not present

## 2022-11-11 DIAGNOSIS — R0602 Shortness of breath: Secondary | ICD-10-CM | POA: Diagnosis not present

## 2022-11-11 DIAGNOSIS — R627 Adult failure to thrive: Secondary | ICD-10-CM | POA: Diagnosis not present

## 2022-11-11 DIAGNOSIS — C7A090 Malignant carcinoid tumor of the bronchus and lung: Secondary | ICD-10-CM | POA: Diagnosis present

## 2022-11-11 DIAGNOSIS — R6889 Other general symptoms and signs: Secondary | ICD-10-CM | POA: Diagnosis not present

## 2022-11-11 DIAGNOSIS — N179 Acute kidney failure, unspecified: Secondary | ICD-10-CM | POA: Diagnosis present

## 2022-11-11 DIAGNOSIS — K5730 Diverticulosis of large intestine without perforation or abscess without bleeding: Secondary | ICD-10-CM | POA: Diagnosis not present

## 2022-11-11 DIAGNOSIS — E785 Hyperlipidemia, unspecified: Secondary | ICD-10-CM | POA: Diagnosis present

## 2022-11-11 DIAGNOSIS — N1832 Chronic kidney disease, stage 3b: Secondary | ICD-10-CM | POA: Diagnosis present

## 2022-11-11 DIAGNOSIS — Z794 Long term (current) use of insulin: Secondary | ICD-10-CM

## 2022-11-11 DIAGNOSIS — K2289 Other specified disease of esophagus: Secondary | ICD-10-CM | POA: Diagnosis present

## 2022-11-11 DIAGNOSIS — R911 Solitary pulmonary nodule: Secondary | ICD-10-CM | POA: Diagnosis not present

## 2022-11-11 DIAGNOSIS — R1310 Dysphagia, unspecified: Secondary | ICD-10-CM | POA: Diagnosis not present

## 2022-11-11 DIAGNOSIS — M1990 Unspecified osteoarthritis, unspecified site: Secondary | ICD-10-CM | POA: Diagnosis present

## 2022-11-11 DIAGNOSIS — R112 Nausea with vomiting, unspecified: Secondary | ICD-10-CM | POA: Diagnosis not present

## 2022-11-11 DIAGNOSIS — G4700 Insomnia, unspecified: Secondary | ICD-10-CM | POA: Diagnosis not present

## 2022-11-11 DIAGNOSIS — R531 Weakness: Secondary | ICD-10-CM | POA: Diagnosis not present

## 2022-11-11 DIAGNOSIS — I130 Hypertensive heart and chronic kidney disease with heart failure and stage 1 through stage 4 chronic kidney disease, or unspecified chronic kidney disease: Secondary | ICD-10-CM | POA: Diagnosis not present

## 2022-11-11 DIAGNOSIS — R933 Abnormal findings on diagnostic imaging of other parts of digestive tract: Secondary | ICD-10-CM | POA: Diagnosis not present

## 2022-11-11 DIAGNOSIS — Z833 Family history of diabetes mellitus: Secondary | ICD-10-CM

## 2022-11-11 DIAGNOSIS — E876 Hypokalemia: Secondary | ICD-10-CM | POA: Diagnosis present

## 2022-11-11 DIAGNOSIS — M11231 Other chondrocalcinosis, right wrist: Secondary | ICD-10-CM | POA: Diagnosis not present

## 2022-11-11 DIAGNOSIS — E039 Hypothyroidism, unspecified: Secondary | ICD-10-CM | POA: Diagnosis present

## 2022-11-11 DIAGNOSIS — Z823 Family history of stroke: Secondary | ICD-10-CM

## 2022-11-11 DIAGNOSIS — D3A090 Benign carcinoid tumor of the bronchus and lung: Secondary | ICD-10-CM | POA: Diagnosis not present

## 2022-11-11 DIAGNOSIS — J9811 Atelectasis: Secondary | ICD-10-CM | POA: Diagnosis present

## 2022-11-11 DIAGNOSIS — Z885 Allergy status to narcotic agent status: Secondary | ICD-10-CM

## 2022-11-11 DIAGNOSIS — I708 Atherosclerosis of other arteries: Secondary | ICD-10-CM | POA: Diagnosis present

## 2022-11-11 DIAGNOSIS — M25521 Pain in right elbow: Secondary | ICD-10-CM | POA: Diagnosis not present

## 2022-11-11 DIAGNOSIS — R0600 Dyspnea, unspecified: Secondary | ICD-10-CM | POA: Diagnosis not present

## 2022-11-11 DIAGNOSIS — N281 Cyst of kidney, acquired: Secondary | ICD-10-CM | POA: Diagnosis not present

## 2022-11-11 DIAGNOSIS — D631 Anemia in chronic kidney disease: Secondary | ICD-10-CM | POA: Diagnosis present

## 2022-11-11 DIAGNOSIS — E538 Deficiency of other specified B group vitamins: Secondary | ICD-10-CM | POA: Diagnosis not present

## 2022-11-11 DIAGNOSIS — M19031 Primary osteoarthritis, right wrist: Secondary | ICD-10-CM | POA: Diagnosis not present

## 2022-11-11 DIAGNOSIS — K224 Dyskinesia of esophagus: Secondary | ICD-10-CM | POA: Diagnosis present

## 2022-11-11 DIAGNOSIS — H9190 Unspecified hearing loss, unspecified ear: Secondary | ICD-10-CM | POA: Diagnosis present

## 2022-11-11 DIAGNOSIS — I5033 Acute on chronic diastolic (congestive) heart failure: Secondary | ICD-10-CM | POA: Diagnosis present

## 2022-11-11 DIAGNOSIS — J9601 Acute respiratory failure with hypoxia: Secondary | ICD-10-CM | POA: Diagnosis present

## 2022-11-11 DIAGNOSIS — Z7984 Long term (current) use of oral hypoglycemic drugs: Secondary | ICD-10-CM

## 2022-11-11 DIAGNOSIS — R0902 Hypoxemia: Secondary | ICD-10-CM | POA: Diagnosis present

## 2022-11-11 DIAGNOSIS — Z743 Need for continuous supervision: Secondary | ICD-10-CM | POA: Diagnosis not present

## 2022-11-11 DIAGNOSIS — Z8673 Personal history of transient ischemic attack (TIA), and cerebral infarction without residual deficits: Secondary | ICD-10-CM

## 2022-11-11 DIAGNOSIS — E43 Unspecified severe protein-calorie malnutrition: Secondary | ICD-10-CM | POA: Diagnosis not present

## 2022-11-11 DIAGNOSIS — Z66 Do not resuscitate: Secondary | ICD-10-CM | POA: Diagnosis present

## 2022-11-11 DIAGNOSIS — E222 Syndrome of inappropriate secretion of antidiuretic hormone: Secondary | ICD-10-CM | POA: Diagnosis not present

## 2022-11-11 DIAGNOSIS — I4821 Permanent atrial fibrillation: Secondary | ICD-10-CM | POA: Diagnosis not present

## 2022-11-11 DIAGNOSIS — M542 Cervicalgia: Secondary | ICD-10-CM | POA: Diagnosis not present

## 2022-11-11 DIAGNOSIS — I420 Dilated cardiomyopathy: Secondary | ICD-10-CM | POA: Diagnosis present

## 2022-11-11 DIAGNOSIS — D649 Anemia, unspecified: Secondary | ICD-10-CM | POA: Diagnosis present

## 2022-11-11 DIAGNOSIS — I10 Essential (primary) hypertension: Secondary | ICD-10-CM | POA: Diagnosis present

## 2022-11-11 DIAGNOSIS — K219 Gastro-esophageal reflux disease without esophagitis: Secondary | ICD-10-CM | POA: Diagnosis not present

## 2022-11-11 DIAGNOSIS — Z90710 Acquired absence of both cervix and uterus: Secondary | ICD-10-CM

## 2022-11-11 DIAGNOSIS — I428 Other cardiomyopathies: Secondary | ICD-10-CM | POA: Diagnosis not present

## 2022-11-11 DIAGNOSIS — J9690 Respiratory failure, unspecified, unspecified whether with hypoxia or hypercapnia: Secondary | ICD-10-CM | POA: Diagnosis not present

## 2022-11-11 DIAGNOSIS — J90 Pleural effusion, not elsewhere classified: Secondary | ICD-10-CM | POA: Diagnosis not present

## 2022-11-11 DIAGNOSIS — E871 Hypo-osmolality and hyponatremia: Secondary | ICD-10-CM | POA: Diagnosis not present

## 2022-11-11 DIAGNOSIS — Z20822 Contact with and (suspected) exposure to covid-19: Secondary | ICD-10-CM | POA: Diagnosis present

## 2022-11-11 DIAGNOSIS — K5909 Other constipation: Secondary | ICD-10-CM | POA: Diagnosis present

## 2022-11-11 DIAGNOSIS — Z515 Encounter for palliative care: Secondary | ICD-10-CM | POA: Diagnosis not present

## 2022-11-11 DIAGNOSIS — K3189 Other diseases of stomach and duodenum: Secondary | ICD-10-CM | POA: Diagnosis not present

## 2022-11-11 DIAGNOSIS — J811 Chronic pulmonary edema: Secondary | ICD-10-CM | POA: Diagnosis not present

## 2022-11-11 DIAGNOSIS — M79601 Pain in right arm: Secondary | ICD-10-CM | POA: Diagnosis not present

## 2022-11-11 DIAGNOSIS — C7A8 Other malignant neuroendocrine tumors: Secondary | ICD-10-CM | POA: Diagnosis not present

## 2022-11-11 DIAGNOSIS — R54 Age-related physical debility: Secondary | ICD-10-CM | POA: Diagnosis not present

## 2022-11-11 DIAGNOSIS — Z79899 Other long term (current) drug therapy: Secondary | ICD-10-CM

## 2022-11-11 DIAGNOSIS — E1122 Type 2 diabetes mellitus with diabetic chronic kidney disease: Secondary | ICD-10-CM | POA: Diagnosis not present

## 2022-11-11 DIAGNOSIS — R918 Other nonspecific abnormal finding of lung field: Secondary | ICD-10-CM | POA: Diagnosis not present

## 2022-11-11 DIAGNOSIS — Z79890 Hormone replacement therapy (postmenopausal): Secondary | ICD-10-CM

## 2022-11-11 DIAGNOSIS — Z9861 Coronary angioplasty status: Secondary | ICD-10-CM

## 2022-11-11 DIAGNOSIS — Z7722 Contact with and (suspected) exposure to environmental tobacco smoke (acute) (chronic): Secondary | ICD-10-CM | POA: Diagnosis present

## 2022-11-11 DIAGNOSIS — Z8249 Family history of ischemic heart disease and other diseases of the circulatory system: Secondary | ICD-10-CM

## 2022-11-11 DIAGNOSIS — Z006 Encounter for examination for normal comparison and control in clinical research program: Secondary | ICD-10-CM

## 2022-11-11 DIAGNOSIS — M25531 Pain in right wrist: Secondary | ICD-10-CM | POA: Diagnosis not present

## 2022-11-11 DIAGNOSIS — I4891 Unspecified atrial fibrillation: Secondary | ICD-10-CM | POA: Diagnosis present

## 2022-11-11 DIAGNOSIS — K529 Noninfective gastroenteritis and colitis, unspecified: Secondary | ICD-10-CM | POA: Diagnosis present

## 2022-11-11 DIAGNOSIS — Z9049 Acquired absence of other specified parts of digestive tract: Secondary | ICD-10-CM

## 2022-11-11 DIAGNOSIS — M19011 Primary osteoarthritis, right shoulder: Secondary | ICD-10-CM | POA: Diagnosis not present

## 2022-11-11 DIAGNOSIS — Z8719 Personal history of other diseases of the digestive system: Secondary | ICD-10-CM

## 2022-11-11 DIAGNOSIS — E119 Type 2 diabetes mellitus without complications: Secondary | ICD-10-CM

## 2022-11-11 LAB — CBC WITH DIFFERENTIAL/PLATELET
Abs Immature Granulocytes: 0.07 10*3/uL (ref 0.00–0.07)
Basophils Absolute: 0 10*3/uL (ref 0.0–0.1)
Basophils Relative: 1 %
Eosinophils Absolute: 0 10*3/uL (ref 0.0–0.5)
Eosinophils Relative: 1 %
HCT: 32.6 % — ABNORMAL LOW (ref 36.0–46.0)
Hemoglobin: 10.8 g/dL — ABNORMAL LOW (ref 12.0–15.0)
Immature Granulocytes: 1 %
Lymphocytes Relative: 20 %
Lymphs Abs: 1.3 10*3/uL (ref 0.7–4.0)
MCH: 29.4 pg (ref 26.0–34.0)
MCHC: 33.1 g/dL (ref 30.0–36.0)
MCV: 88.8 fL (ref 80.0–100.0)
Monocytes Absolute: 0.8 10*3/uL (ref 0.1–1.0)
Monocytes Relative: 13 %
Neutro Abs: 4.1 10*3/uL (ref 1.7–7.7)
Neutrophils Relative %: 64 %
Platelets: 118 10*3/uL — ABNORMAL LOW (ref 150–400)
RBC: 3.67 MIL/uL — ABNORMAL LOW (ref 3.87–5.11)
RDW: 13.2 % (ref 11.5–15.5)
WBC: 6.4 10*3/uL (ref 4.0–10.5)
nRBC: 0 % (ref 0.0–0.2)

## 2022-11-11 LAB — BRAIN NATRIURETIC PEPTIDE: B Natriuretic Peptide: 280.5 pg/mL — ABNORMAL HIGH (ref 0.0–100.0)

## 2022-11-11 LAB — BASIC METABOLIC PANEL
Anion gap: 9 (ref 5–15)
BUN: 19 mg/dL (ref 8–23)
CO2: 24 mmol/L (ref 22–32)
Calcium: 9.2 mg/dL (ref 8.9–10.3)
Chloride: 105 mmol/L (ref 98–111)
Creatinine, Ser: 1.23 mg/dL — ABNORMAL HIGH (ref 0.44–1.00)
GFR, Estimated: 43 mL/min — ABNORMAL LOW (ref >60)
Glucose, Bld: 144 mg/dL — ABNORMAL HIGH (ref 70–99)
Potassium: 3.8 mmol/L (ref 3.5–5.1)
Sodium: 138 mmol/L (ref 135–145)

## 2022-11-11 LAB — TROPONIN I (HIGH SENSITIVITY): Troponin I (High Sensitivity): 4 ng/L (ref <18)

## 2022-11-11 NOTE — Telephone Encounter (Signed)
Patient arrived at the office with her son. She was SOB and unable to make a complete sentence. Stated she was unable to breathe and felt as though she was suffocating, and could not get out of the car to get into the building. She said she had felt like this for a few hours. I advised her to go to the ER, and her son agreed. They decided to call 911, so that she could get to the ER faster than him driving. Upon the arrival of EMS the patients O2 was in the 80's and pulse was 114. She was put into the EMS and taken to the ER.

## 2022-11-11 NOTE — ED Triage Notes (Signed)
Pt arrives via EMS cardiologist- while there feeling SOB. This has persisted since last night. Denies CP. Pt is on a trial med for Afib. Denies any CP. Pt is in afib at this time. Room air 88%, improved to 94% with 2L [place]. Does not wear any at home. HR 116, BP 148/58, CBG 167

## 2022-11-11 NOTE — ED Provider Triage Note (Signed)
Emergency Medicine Provider Triage Evaluation Note  [name] , a 88 y.o. female  was evaluated in triage.  Pt complains of shortness of breath onset last night. Pt notes that she was at her cardiologist and started feeling short of breath. 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 Denies abdominal pain, nausea, vomiting.  Review of Systems  Positive:  Negative:   Physical Exam  BP (!) 149/98 (BP Location: Left Arm)   Pulse 99   Temp (!) 97.4 F (36.3 C) (Oral)   Resp (!) 24   SpO2 93%  Gen:   Awake, no distress   Resp:  Normal effort  MSK:   Moves extremities without difficulty  Other:  Mild left chest wall TTP  Medical Decision Making  Medically screening exam initiated at 11:52 AM.  Appropriate orders placed.  [name] was informed that the remainder of the evaluation will be completed by another provider, this initial triage assessment does not replace that evaluation, and the importance of remaining in the ED until their evaluation is complete.  11:58 AM - Discussed with RN that patient is in need of a room immediately. RN aware and working on room placement.    [name] A, PA-C 11/11/22 1201

## 2022-11-12 ENCOUNTER — Emergency Department (HOSPITAL_COMMUNITY): Payer: Medicare Other

## 2022-11-12 ENCOUNTER — Observation Stay (HOSPITAL_COMMUNITY): Payer: Medicare Other

## 2022-11-12 DIAGNOSIS — I428 Other cardiomyopathies: Secondary | ICD-10-CM | POA: Diagnosis not present

## 2022-11-12 DIAGNOSIS — R911 Solitary pulmonary nodule: Secondary | ICD-10-CM | POA: Diagnosis not present

## 2022-11-12 DIAGNOSIS — R0902 Hypoxemia: Secondary | ICD-10-CM | POA: Diagnosis not present

## 2022-11-12 LAB — CBC
HCT: 29.4 % — ABNORMAL LOW (ref 36.0–46.0)
Hemoglobin: 9.9 g/dL — ABNORMAL LOW (ref 12.0–15.0)
MCH: 29.6 pg (ref 26.0–34.0)
MCHC: 33.7 g/dL (ref 30.0–36.0)
MCV: 87.8 fL (ref 80.0–100.0)
Platelets: 112 10*3/uL — ABNORMAL LOW (ref 150–400)
RBC: 3.35 MIL/uL — ABNORMAL LOW (ref 3.87–5.11)
RDW: 13.6 % (ref 11.5–15.5)
WBC: 4.9 10*3/uL (ref 4.0–10.5)
nRBC: 0 % (ref 0.0–0.2)

## 2022-11-12 LAB — D-DIMER, QUANTITATIVE: D-Dimer, Quant: 4.68 ug/mL-FEU — ABNORMAL HIGH (ref 0.00–0.50)

## 2022-11-12 LAB — CREATININE, SERUM
Creatinine, Ser: 1.37 mg/dL — ABNORMAL HIGH (ref 0.44–1.00)
GFR, Estimated: 38 mL/min — ABNORMAL LOW (ref >60)

## 2022-11-12 LAB — CBG MONITORING, ED
Glucose-Capillary: 136 mg/dL — ABNORMAL HIGH (ref 70–99)
Glucose-Capillary: 192 mg/dL — ABNORMAL HIGH (ref 70–99)

## 2022-11-12 LAB — RESP PANEL BY RT-PCR (FLU A&B, COVID) ARPGX2
Influenza A by PCR: NEGATIVE
Influenza B by PCR: NEGATIVE
SARS Coronavirus 2 by RT PCR: NEGATIVE

## 2022-11-12 LAB — HEMOGLOBIN A1C
Hgb A1c MFr Bld: 5.8 % — ABNORMAL HIGH (ref 4.8–5.6)
Mean Plasma Glucose: 119.76 mg/dL

## 2022-11-12 MED ORDER — ONDANSETRON HCL 4 MG/2ML IJ SOLN
4.0000 mg | Freq: Four times a day (QID) | INTRAMUSCULAR | Status: DC | PRN
  Administered 2022-11-15 (×2): 4 mg via INTRAVENOUS
  Filled 2022-11-12 (×2): qty 2

## 2022-11-12 MED ORDER — LEVOTHYROXINE SODIUM 88 MCG PO TABS
88.0000 ug | ORAL_TABLET | Freq: Every day | ORAL | Status: DC
  Administered 2022-11-12 – 2022-11-26 (×15): 88 ug via ORAL
  Filled 2022-11-12 (×15): qty 1

## 2022-11-12 MED ORDER — INSULIN ASPART 100 UNIT/ML IJ SOLN
0.0000 [IU] | Freq: Three times a day (TID) | INTRAMUSCULAR | Status: DC
  Administered 2022-11-12: 1 [IU] via SUBCUTANEOUS
  Administered 2022-11-13 (×2): 2 [IU] via SUBCUTANEOUS
  Administered 2022-11-13 – 2022-11-15 (×5): 1 [IU] via SUBCUTANEOUS
  Administered 2022-11-15: 2 [IU] via SUBCUTANEOUS
  Administered 2022-11-15: 3 [IU] via SUBCUTANEOUS
  Administered 2022-11-16 (×2): 2 [IU] via SUBCUTANEOUS
  Administered 2022-11-17: 1 [IU] via SUBCUTANEOUS
  Administered 2022-11-17: 2 [IU] via SUBCUTANEOUS
  Administered 2022-11-18 (×2): 1 [IU] via SUBCUTANEOUS
  Administered 2022-11-18: 2 [IU] via SUBCUTANEOUS
  Administered 2022-11-19 (×2): 3 [IU] via SUBCUTANEOUS
  Administered 2022-11-19: 2 [IU] via SUBCUTANEOUS
  Administered 2022-11-20: 3 [IU] via SUBCUTANEOUS
  Administered 2022-11-20: 2 [IU] via SUBCUTANEOUS
  Administered 2022-11-20: 3 [IU] via SUBCUTANEOUS
  Administered 2022-11-21: 5 [IU] via SUBCUTANEOUS
  Administered 2022-11-21: 7 [IU] via SUBCUTANEOUS
  Administered 2022-11-21: 5 [IU] via SUBCUTANEOUS
  Administered 2022-11-22 (×2): 2 [IU] via SUBCUTANEOUS
  Administered 2022-11-23: 3 [IU] via SUBCUTANEOUS
  Administered 2022-11-23 – 2022-11-24 (×2): 2 [IU] via SUBCUTANEOUS
  Administered 2022-11-24 – 2022-11-25 (×3): 3 [IU] via SUBCUTANEOUS
  Administered 2022-11-25 (×2): 1 [IU] via SUBCUTANEOUS
  Administered 2022-11-26: 3 [IU] via SUBCUTANEOUS
  Administered 2022-11-26: 5 [IU] via SUBCUTANEOUS
  Administered 2022-11-26: 1 [IU] via SUBCUTANEOUS

## 2022-11-12 MED ORDER — STUDY - INVESTIGATIONAL MEDICATION
1.0000 | Freq: Every evening | Status: DC
  Filled 2022-11-12: qty 1

## 2022-11-12 MED ORDER — LEVALBUTEROL HCL 1.25 MG/0.5ML IN NEBU
1.2500 mg | INHALATION_SOLUTION | Freq: Three times a day (TID) | RESPIRATORY_TRACT | Status: DC
  Administered 2022-11-12 – 2022-11-15 (×7): 1.25 mg via RESPIRATORY_TRACT
  Filled 2022-11-12 (×8): qty 0.5

## 2022-11-12 MED ORDER — IPRATROPIUM BROMIDE 0.02 % IN SOLN
0.5000 mg | Freq: Three times a day (TID) | RESPIRATORY_TRACT | Status: DC
  Administered 2022-11-12 – 2022-11-15 (×7): 0.5 mg via RESPIRATORY_TRACT
  Filled 2022-11-12 (×9): qty 2.5

## 2022-11-12 MED ORDER — IOHEXOL 350 MG/ML SOLN
50.0000 mL | Freq: Once | INTRAVENOUS | Status: AC | PRN
  Administered 2022-11-12: 50 mL via INTRAVENOUS

## 2022-11-12 MED ORDER — ACETAMINOPHEN 325 MG PO TABS
650.0000 mg | ORAL_TABLET | Freq: Four times a day (QID) | ORAL | Status: DC | PRN
  Administered 2022-11-14 – 2022-11-23 (×9): 650 mg via ORAL
  Filled 2022-11-12 (×9): qty 2

## 2022-11-12 MED ORDER — STUDY - INVESTIGATIONAL MEDICATION
1.0000 | Freq: Every morning | Status: DC
  Administered 2022-11-13 – 2022-11-17 (×5): 1 via ORAL
  Filled 2022-11-12 (×6): qty 1

## 2022-11-12 MED ORDER — ENOXAPARIN SODIUM 40 MG/0.4ML IJ SOSY
40.0000 mg | PREFILLED_SYRINGE | INTRAMUSCULAR | Status: DC
  Administered 2022-11-12 – 2022-11-13 (×2): 40 mg via SUBCUTANEOUS
  Filled 2022-11-12 (×2): qty 0.4

## 2022-11-12 MED ORDER — PANTOPRAZOLE SODIUM 40 MG PO TBEC
40.0000 mg | DELAYED_RELEASE_TABLET | Freq: Every day | ORAL | Status: DC
  Administered 2022-11-12 – 2022-11-13 (×2): 40 mg via ORAL
  Filled 2022-11-12 (×2): qty 1

## 2022-11-12 MED ORDER — HYDRALAZINE HCL 20 MG/ML IJ SOLN: 10.0000 mg | INTRAMUSCULAR | Status: DC | PRN

## 2022-11-12 MED ORDER — TRAZODONE HCL 50 MG PO TABS
50.0000 mg | ORAL_TABLET | Freq: Every evening | ORAL | Status: DC | PRN
  Administered 2022-11-18 – 2022-11-21 (×4): 50 mg via ORAL
  Filled 2022-11-12 (×4): qty 1

## 2022-11-12 MED ORDER — ROSUVASTATIN CALCIUM 20 MG PO TABS
20.0000 mg | ORAL_TABLET | Freq: Every day | ORAL | Status: DC
  Administered 2022-11-12 – 2022-11-26 (×15): 20 mg via ORAL
  Filled 2022-11-12 (×15): qty 1

## 2022-11-12 MED ORDER — STUDY - INVESTIGATIONAL MEDICATION
Freq: Every morning | Status: DC
  Filled 2022-11-12: qty 1

## 2022-11-12 MED ORDER — SODIUM CHLORIDE 0.9 % IV SOLN
1.0000 g | Freq: Once | INTRAVENOUS | Status: AC
  Administered 2022-11-12: 1 g via INTRAVENOUS
  Filled 2022-11-12: qty 10

## 2022-11-12 MED ORDER — METOPROLOL TARTRATE 5 MG/5ML IV SOLN
5.0000 mg | INTRAVENOUS | Status: DC | PRN
  Administered 2022-11-12 – 2022-11-14 (×2): 5 mg via INTRAVENOUS
  Filled 2022-11-12 (×2): qty 5

## 2022-11-12 MED ORDER — DILTIAZEM HCL ER COATED BEADS 180 MG PO CP24
180.0000 mg | ORAL_CAPSULE | Freq: Every day | ORAL | Status: DC
  Administered 2022-11-12 – 2022-11-25 (×14): 180 mg via ORAL
  Filled 2022-11-12 (×16): qty 1

## 2022-11-12 MED ORDER — DILTIAZEM HCL ER 180 MG PO CP24: 180.0000 mg | ORAL_CAPSULE | Freq: Every day | ORAL | Status: DC

## 2022-11-12 MED ORDER — STUDY - INVESTIGATIONAL MEDICATION: 1.0000 | Freq: Every morning | Status: DC

## 2022-11-12 MED ORDER — METOPROLOL SUCCINATE ER 50 MG PO TB24
50.0000 mg | ORAL_TABLET | Freq: Every day | ORAL | Status: DC
  Administered 2022-11-12 – 2022-11-19 (×8): 50 mg via ORAL
  Filled 2022-11-12 (×7): qty 1
  Filled 2022-11-12: qty 2

## 2022-11-12 MED ORDER — GUAIFENESIN 100 MG/5ML PO LIQD
5.0000 mL | ORAL | Status: DC | PRN
  Administered 2022-11-20: 5 mL via ORAL
  Filled 2022-11-12: qty 5

## 2022-11-12 MED ORDER — SENNOSIDES-DOCUSATE SODIUM 8.6-50 MG PO TABS: 1.0000 | ORAL_TABLET | Freq: Every evening | ORAL | Status: DC | PRN

## 2022-11-12 MED ORDER — IPRATROPIUM-ALBUTEROL 0.5-2.5 (3) MG/3ML IN SOLN
3.0000 mL | Freq: Once | RESPIRATORY_TRACT | Status: AC
  Administered 2022-11-12: 3 mL via RESPIRATORY_TRACT
  Filled 2022-11-12: qty 3

## 2022-11-12 MED ORDER — STUDY - INVESTIGATIONAL MEDICATION
1.0000 | Freq: Every evening | Status: DC
  Administered 2022-11-12 – 2022-11-16 (×4): 1 via ORAL
  Filled 2022-11-12 (×4): qty 1

## 2022-11-12 MED ORDER — SODIUM CHLORIDE 0.9 % IV SOLN
500.0000 mg | Freq: Once | INTRAVENOUS | Status: AC
  Administered 2022-11-12: 500 mg via INTRAVENOUS
  Filled 2022-11-12: qty 5

## 2022-11-12 MED ORDER — STUDY - INVESTIGATIONAL MEDICATION: Freq: Every evening | Status: DC

## 2022-11-12 MED ORDER — FUROSEMIDE 10 MG/ML IJ SOLN
20.0000 mg | Freq: Two times a day (BID) | INTRAMUSCULAR | Status: AC
  Administered 2022-11-12 – 2022-11-13 (×2): 20 mg via INTRAVENOUS
  Filled 2022-11-12 (×2): qty 2

## 2022-11-12 MED ORDER — LINAGLIPTIN 5 MG PO TABS
5.0000 mg | ORAL_TABLET | Freq: Every day | ORAL | Status: DC
  Administered 2022-11-12 – 2022-11-13 (×2): 5 mg via ORAL
  Filled 2022-11-12 (×3): qty 1

## 2022-11-12 NOTE — ED Provider Notes (Addendum)
[facility] EMERGENCY DEPARTMENT Provider Note   CSN: [id number] Arrival date & time: 11/11/22  1120     History  Chief Complaint  Patient presents with   Shortness of Breath    [name] is a 88 y.o. female who presents with her son at the bedside on referral from her cardiologist office.  Patient is an 88 year old female with history of coronary disease, atrial fibrillation (question anticoagulation status??),  Type 2 diabetes who presents to the ED with concern for shortness of breath that has been progressively worsening over the last week significantly worse with exertion.  Patient states that she had several episodes over the last couple days where she "thought I was going to die" because she could not catch her breath.  Patient is not on oxygen requirement at home.  Presented to her cardiologist office yesterday morning, [facility] cardiology with Dr. Einar Gip, where she was unable to even exit the vehicle due to her exquisite shortness of breath and was directed to the ED. Unfortunately patient waited in our emergency department waiting room for 18 hours before being bedded for evaluation by an ED provider.  At this time she continues to require 2 L supplemental oxygen by nasal cannula with normal vital signs at this time.  In addition to the above listed history patient is noted to be on double-blind study for experimental drug for atrial fibrillation.  Based on clinical trial documentation and medication bottles with the patient in the emergency department it is unclear if she is truly anticoagulated at this time or not.  We will attempt to contact patient's coordinator for the study, Dr. Einar Gip to clarify patient's anticoagulation status.  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  Also has history of CVA and hypothyroidism.  HPI     Home Medications Prior to Admission medications    Medication Sig Start Date End Date Taking? Authorizing Provider  acetaminophen (TYLENOL) 500 MG tablet Take 500-1,000 mg by mouth every 6 (six) hours as needed for moderate pain.    [provider]  B Complex-C (B-COMPLEX WITH VITAMIN C) tablet Take 1 tablet by mouth daily.    [provider]  calcium carbonate (TUMS) 500 MG chewable tablet Chew 1 tablet by mouth daily.    [provider]  Calcium Citrate-Vitamin D ([employer] CALCIUM CITRATE +D3 MINI PO) Take 1 capsule by mouth daily.    [provider]  diltiazem (DILT-XR) 180 MG 24 hr capsule TAKE 1 CAPSULE(180 MG) BY MOUTH DAILY 10/21/22   Adrian Prows, MD  Fluocinolone Acetonide 0.01 % OIL Place 5 drops in ear(s) as needed. 07/25/22   [provider]  insulin degludec (TRESIBA FLEXTOUCH) 100 UNIT/ML FlexTouch Pen daily. 05/13/22   [provider]  Insulin Pen Needle (NOVOFINE PLUS PEN NEEDLE) 32G X 4 MM MISC ONE 04/10/22   [provider]  levothyroxine (SYNTHROID) 88 MCG tablet Take 88 mcg by mouth daily. 07/25/22   [provider]  metoprolol succinate (TOPROL-XL) 50 MG 24 hr tablet Take 1 tablet (50 mg total) by mouth daily. 06/20/22   Adrian Prows, MD  mupirocin ointment (BACTROBAN) 2 % Apply 1 application topically 2 (two) times daily as needed (sores).    [provider]  pantoprazole (PROTONIX) 40 MG tablet Take 1 tablet (40 mg total) by mouth daily. 08/26/22   Freddi Starr, MD  rosuvastatin (CRESTOR) 20 MG tablet Take 1 tablet (  20 mg total) by mouth daily. 06/20/22 12/17/22  Adrian Prows, MD  Tiotropium Bromide-Olodaterol (STIOLTO RESPIMAT) 2.5-2.5 MCG/ACT AERS Inhale 2 puffs into the lungs daily. 07/31/22   Freddi Starr, MD  Tiotropium Bromide-Olodaterol (STIOLTO RESPIMAT) 2.5-2.5 MCG/ACT AERS Inhale 2 puffs into the lungs daily. 08/26/22   Freddi Starr, MD      Allergies    Codeine and Hydrocodone-acetaminophen    Review of Systems   Review of Systems   Constitutional: Negative.   HENT: Negative.    Respiratory:  Positive for shortness of breath.   Cardiovascular:  Positive for chest pain.  Gastrointestinal: Negative.   Neurological:  Positive for light-headedness.    Physical Exam Updated Vital Signs BP 115/77 (BP Location: Left Arm)   Pulse 81   Temp 97.8 F (36.6 C) (Oral)   Resp 16   SpO2 100%  Physical Exam Vitals and nursing note reviewed.  Constitutional:      Appearance: She is not ill-appearing or toxic-appearing.  HENT:     Head: Normocephalic and atraumatic.     Mouth/Throat:     Mouth: Mucous membranes are moist.     Pharynx: No oropharyngeal exudate or posterior oropharyngeal erythema.  Eyes:     General:        Right eye: No discharge.        Left eye: No discharge.     Conjunctiva/sclera: Conjunctivae normal.  Cardiovascular:     Rate and Rhythm: Normal rate. Rhythm irregular.     Pulses: Normal pulses.  Pulmonary:     Effort: Pulmonary effort is normal. Tachypnea present. No accessory muscle usage, prolonged expiration or respiratory distress.     Breath sounds: Examination of the left-upper field reveals decreased breath sounds. Examination of the right-lower field reveals rales. Examination of the left-lower field reveals rales. Decreased breath sounds and rales present. No wheezing.     Comments: New oxygen requirement with 2 L supplemental oxygen by nasal cannula to maintain saturations greater than 90%. Abdominal:     General: Bowel sounds are normal. There is no distension.     Tenderness: There is no abdominal tenderness.  Musculoskeletal:        General: No deformity.     Cervical back: Neck supple.     Right lower leg: No tenderness. No edema.     Left lower leg: No tenderness. No edema.  Skin:    General: Skin is warm and dry.     Capillary Refill: Capillary refill takes less than 2 seconds.  Neurological:     General: No focal deficit present.     Mental Status: She is alert. Mental  status is at baseline.  Psychiatric:        Mood and Affect: Mood normal.     ED Results / Procedures / Treatments   Labs (all labs ordered are listed, but only abnormal results are displayed) Labs Reviewed  BASIC METABOLIC PANEL - Abnormal; Notable for the following components:      Result Value   Glucose, Bld 144 (*)    Creatinine, Ser 1.23 (*)    GFR, Estimated 43 (*)    All other components within normal limits  CBC WITH DIFFERENTIAL/PLATELET - Abnormal; Notable for the following components:   RBC 3.67 (*)    Hemoglobin 10.8 (*)    HCT 32.6 (*)    Platelets 118 (*)    All other components within normal limits  BRAIN NATRIURETIC PEPTIDE - Abnormal; Notable for the following  components:   B Natriuretic Peptide 280.5 (*)    All other components within normal limits  D-DIMER, QUANTITATIVE  TROPONIN I (HIGH SENSITIVITY)  TROPONIN I (HIGH SENSITIVITY)    EKG None  Radiology DG Chest 2 View  Result Date: 11/11/2022 CLINICAL DATA:  Shortness of breath EXAM: CHEST - 2 VIEW COMPARISON:  12/30/2021 FINDINGS: Stable heart size. Aortic atherosclerosis. Streaky bilateral perihilar and bibasilar opacities, may reflect atelectasis. Small layering bilateral pleural effusions. Known bilateral pulmonary nodules, less conspicuous on the current exam. No pneumothorax. IMPRESSION: Streaky bilateral perihilar and bibasilar opacities, may reflect atelectasis. Small layering bilateral pleural effusions. Electronically Signed   By: Davina Poke D.O.   On: 11/11/2022 12:42    Procedures Procedures    Medications Ordered in ED Medications  cefTRIAXone (ROCEPHIN) 1 g in sodium chloride 0.9 % 100 mL IVPB (has no administration in time range)  azithromycin (ZITHROMAX) 500 mg in sodium chloride 0.9 % 250 mL IVPB (has no administration in time range)    ED Course/ Medical Decision Making/ A&P Clinical Course as of 11/12/22 0645  Wed Nov 12, 2022  0623 Consult to patient's primary  cardiologist [facility] cardiology, Dr. Shellia Carwin, who states that she does not participate in the clinical trial that this patient is a part of and would recommend to call patient's primary cardiologist Dr. Nadyne Coombes.  She provided his phone number and instructed me that he would be agreeable to receiving phone call at this time though he is not on-call.  I appreciate her collaboration in the care of this patient.  Consult to Dr. Nadyne Coombes, patient's primary cardiologist who is also the clinical coordinator for her clinical trial.  He states patient is certainly anticoagulated where she is in the trial and that his clinical suspicion for PE at this time would be quite low.  He recommends proceeding with D-dimer before CT angiogram.  Additionally given review of case up to this point, he suspect patient likely has atypical pneumonia rather than a new cardiac or vascular incident.  Given patient's new oxygen requirement plan will certainly be to admit her to the hospital where he is agreeable to consulting on the inpatient side.  If D-dimer is elevated he recommends we proceeded with our clinical judgment in the ED regarding role of PE study.  I appreciate his collaboration in the care of this patient. [RS]    Clinical Course User Index [RS] Carlissa Pesola, Gypsy Balsam, PA-C                           Medical Decision Making 88 year old female presents with exquisite shortness of breath.  Hypertensive, tachypneic, and hypoxic on intake to the ED.  Requiring oxygen at time of my evaluation, new for her from baseline.  Cardiac exam with irregularly irregular rhythm with atrial fibrillation at patient's baseline.  No peripheral edema on exam.  Abdominal exam is benign and pulmonary exam as above with significantly diminished breath sounds in the left upper lobe as well as rales in the lung bases bilaterally.  DDx includes was limited to pneumonia, PE, pleural effusion, pneumothorax, ACS, dysrhythmia.  Amount and/or Complexity  of Data Reviewed Labs: ordered. ECG/medicine tests:     Details: EKG with Afib, no STEMI     Consult to Dr. Einar Gip, cardiologist as above.  We will proceed with a D-dimer at his recommendation as well as treatment for CAP.  Patient will require admission to the hospital for new oxygen requirement,  however will not call for consult for admission at this time as patient will require further work-up before disposition planning.  Care of this patient signed out to oncoming ED provider Domenic Moras, PA-C at time of shift change.  All pertinent HPI physical exam laboratory findings were discussed with him prior to my departure.  Ultimately treatment plan disposition pending completion of work-up and reevaluation by oncoming ED team.  [name] and her son voiced understanding of her medical evaluation and treatment plan. Each of their questions answered to their expressed satisfaction.   This chart was dictated using voice recognition software, Dragon. Despite the best efforts of this provider to proofread and correct errors, errors may still occur which can change documentation meaning.    Final Clinical Impression(s) / ED Diagnoses Final diagnoses:  None    Rx / DC Orders ED Discharge Orders     None         Emeline Darling, PA-C 11/12/22 0644    Mackey Varricchio, Gypsy Balsam, PA-C 11/12/22 0645    Fatima Blank, MD 11/12/22 [phone]

## 2022-11-12 NOTE — ED Provider Notes (Signed)
.Critical Care  Performed by: Domenic Moras, PA-C Authorized by: Domenic Moras, PA-C   Critical care provider statement:    Critical care time (minutes):  30   Critical care was time spent personally by me on the following activities:  Development of treatment plan with patient or surrogate, discussions with consultants, evaluation of patient's response to treatment, examination of patient, ordering and review of laboratory studies, ordering and review of radiographic studies, ordering and performing treatments and interventions, pulse oximetry, re-evaluation of patient's condition and review of old charts    Received signout from previous provider, please see her note for complete H&P.  This is an 88 year old female significant history of neuroendocrine tumor of the lungs, hypertension, paroxysmal A-fib, CAD, diabetes who presenting via EMS from cardiology office with complaints of shortness of breath.  Initially she was documented to have an O2 sats of 88% on room air improved to 100% on 2 L of oxygen.  She reports she feels much better with supplemental oxygen.  She endorsed increased shortness of breath ongoing for more than a week.  She is on some trial medication, and received care through Cardiologist DR. Ganji.  Work-up today is remarkable for elevated D-dimer, and elevated BNP.  Chest CT angiogram obtained independently viewed interpreted by me and are negative for acute PE.  When patient ambulates, her heart rates increases to the 140s and she became symptomatic however she was not hypoxic.  I appreciate consultation from [facility] hospitalist Dr. Reesa Chew who agrees to see and will admit patient for further care.  She may benefit from repeat cardiac echo and further work-up.  BP 130/70 (BP Location: Left Arm)   Pulse 68   Temp 98 F (36.7 C) (Oral)   Resp 16   SpO2 100%   Results for orders placed or performed during the hospital encounter of 09/20/29  Basic metabolic panel  Result Value Ref  Range   Sodium 138 135 - 145 mmol/L   Potassium 3.8 3.5 - 5.1 mmol/L   Chloride 105 98 - 111 mmol/L   CO2 24 22 - 32 mmol/L   Glucose, Bld 144 (H) 70 - 99 mg/dL   BUN 19 8 - 23 mg/dL   Creatinine, Ser 1.23 (H) 0.44 - 1.00 mg/dL   Calcium 9.2 8.9 - 10.3 mg/dL   GFR, Estimated 43 (L) >60 mL/min   Anion gap 9 5 - 15  CBC with Differential  Result Value Ref Range   WBC 6.4 4.0 - 10.5 K/uL   RBC 3.67 (L) 3.87 - 5.11 MIL/uL   Hemoglobin 10.8 (L) 12.0 - 15.0 g/dL   HCT 32.6 (L) 36.0 - 46.0 %   MCV 88.8 80.0 - 100.0 fL   MCH 29.4 26.0 - 34.0 pg   MCHC 33.1 30.0 - 36.0 g/dL   RDW 13.2 11.5 - 15.5 %   Platelets 118 (L) 150 - 400 K/uL   nRBC 0.0 0.0 - 0.2 %   Neutrophils Relative % 64 %   Neutro Abs 4.1 1.7 - 7.7 K/uL   Lymphocytes Relative 20 %   Lymphs Abs 1.3 0.7 - 4.0 K/uL   Monocytes Relative 13 %   Monocytes Absolute 0.8 0.1 - 1.0 K/uL   Eosinophils Relative 1 %   Eosinophils Absolute 0.0 0.0 - 0.5 K/uL   Basophils Relative 1 %   Basophils Absolute 0.0 0.0 - 0.1 K/uL   Immature Granulocytes 1 %   Abs Immature Granulocytes 0.07 0.00 - 0.07 K/uL  Brain natriuretic  peptide  Result Value Ref Range   B Natriuretic Peptide 280.5 (H) 0.0 - 100.0 pg/mL  D-dimer, quantitative  Result Value Ref Range   D-Dimer, Quant 4.68 (H) 0.00 - 0.50 ug/mL-FEU  Troponin I (High Sensitivity)  Result Value Ref Range   Troponin I (High Sensitivity) 4 <18 ng/L   DG Chest 2 View  Result Date: 11/11/2022 CLINICAL DATA:  Shortness of breath EXAM: CHEST - 2 VIEW COMPARISON:  12/30/2021 FINDINGS: Stable heart size. Aortic atherosclerosis. Streaky bilateral perihilar and bibasilar opacities, may reflect atelectasis. Small layering bilateral pleural effusions. Known bilateral pulmonary nodules, less conspicuous on the current exam. No pneumothorax. IMPRESSION: Streaky bilateral perihilar and bibasilar opacities, may reflect atelectasis. Small layering bilateral pleural effusions. Electronically Signed   By:  Davina Poke D.O.   On: 11/11/2022 12:42      Domenic Moras, PA-C 11/12/22 1351    Hayden Rasmussen, MD 11/12/22 [phone]

## 2022-11-12 NOTE — H&P (Addendum)
History and Physical    [name] XTG:[id number] DOB: Jan 06, 1934 DOA: 11/11/2022  PCP: [name], FNP Patient coming from: Home  Chief Complaint: Shortness of breath  HPI: [name] is a 88 y.o. female with medical history significant of CAD, pulmonary hypertension, right subclavian artery stenosis, permanent atrial fibrillation, DM 2, HTN, stage IV low grade lung carcinoid/neuroendocrine comes to the hospital with complaints of shortness of breath.  Most of the history is provided by both patient and the son who is present at bedside.  Apparently patient has been feeling exertional shortness of breath for the past several months for which she has been closely following outpatient cardiology and pulmonary.  No obvious reason has been identified but due to worsening of symptoms she was advised by Dr. Einar Gip from cardiology for her to come to the hospital. Patient and son tells me that off-and-on she has had issues with some choking and coughing along with exertional dyspnea even if she walks a few steps going anywhere.  Denies any chest pain, lower extremity swelling and other complaints.  Admits of medication compliance.  In the ER routine blood work was negative, BNP was minimally elevated.  Chest x-ray showed streaky opacity and small bilateral pleural effusion, and elevated D-dimer.  CTA chest was done, prelim was negative for PE but official read is still pending.   Review of Systems: As per HPI otherwise 10 point review of systems negative.  Review of Systems Otherwise negative except as per HPI, including: General: Denies fever, chills, night sweats or unintended weight loss. Resp: Denies cough Cardiac: Denies chest pain, palpitations, orthopnea, paroxysmal nocturnal dyspnea. GI: Denies abdominal pain, nausea, vomiting, diarrhea or constipation GU: Denies dysuria, frequency, hesitancy or incontinence MS: Denies muscle aches, joint pain or swelling Neuro: Denies  headache, neurologic deficits (focal weakness, numbness, tingling), abnormal gait Psych: Denies anxiety, depression, SI/HI/AVH Skin: Denies new rashes or lesions ID: Denies sick contacts, exotic exposures, travel  Past Medical History:  Diagnosis Date   Acute diverticulitis 06/2017   Anemia 06/2017   ARF (acute renal failure) ([place]) 06/2017   CAD (coronary artery disease) 03/13/2012   Diabetes mellitus    Dysrhythmia    ATRIAL FIB   Fall 07/07/2017   HTN (hypertension) 03/13/2012   Hypothyroid 03/13/2012   Lung cancer ([place])    Shortness of breath    Stroke ([place])    Syncope and collapse 04/02/2016   Type II or unspecified type diabetes mellitus without mention of complication, not stated as uncontrolled 03/13/2012    Past Surgical History:  Procedure Laterality Date   ABDOMINAL HYSTERECTOMY     BIOPSY  06/13/2021   Procedure: BIOPSY;  Surgeon: Ronnette Juniper, MD;  Location: [facility];  Service: Gastroenterology;;   BREAST EXCISIONAL BIOPSY Left    BREAST SURGERY     benign tumors removed in [place]  04/20/2012   Procedure: CARDIOVERSION;  Surgeon: Laverda Page, MD;  Location: [place];  Service: Cardiovascular;  Laterality: N/A;   CARDIOVERSION N/A 05/10/2013   Procedure: CARDIOVERSION;  Surgeon: Laverda Page, MD;  Location: [place];  Service: Cardiovascular;  Laterality: N/A;   CHOLECYSTECTOMY     COLONOSCOPY WITH PROPOFOL N/A 06/13/2021   Procedure: COLONOSCOPY WITH PROPOFOL;  Surgeon: Ronnette Juniper, MD;  [facility];  Service: Gastroenterology;  Laterality: N/A;   ESOPHAGOGASTRODUODENOSCOPY (EGD) WITH PROPOFOL N/A 06/13/2021   Procedure: ESOPHAGOGASTRODUODENOSCOPY (EGD) WITH PROPOFOL;  Surgeon: Ronnette Juniper, MD;  [facility];  Service: Gastroenterology;  Laterality: N/A;   HEMORRHOID SURGERY     jaw replacement     LEFT HEART CATHETERIZATION WITH CORONARY ANGIOGRAM N/A 04/20/2015   Procedure: LEFT HEART  CATHETERIZATION WITH CORONARY ANGIOGRAM;  Surgeon: Adrian Prows, MD;  Location: [facility] CATH LAB;  Service: Cardiovascular;  Laterality: N/A;   tumors     back of head    SOCIAL HISTORY:  reports that she has never smoked. She has never used smokeless tobacco. She reports that she does not drink alcohol and does not use drugs.  Allergies  Allergen Reactions   Codeine Nausea And Vomiting   Hydrocodone-Acetaminophen Nausea And Vomiting    FAMILY HISTORY: Family History  Problem Relation Age of Onset   Breast cancer Mother    Aneurysm Father        Likely thoracic aneurysm per patient   Hypertension Sister    Heart disease Sister    Diabetes type II Sister    Diabetes type II Sister    Diverticulitis Sister    Diabetes type II Sister    Stroke Sister    Heart disease Brother    Melanoma Brother    Melanoma Brother      Prior to Admission medications   Medication Sig Start Date End Date Taking? Authorizing Provider  acetaminophen (TYLENOL) 500 MG tablet Take 500-1,000 mg by mouth every 6 (six) hours as needed for moderate pain.   Yes [provider]  calcium carbonate (TUMS) 500 MG chewable tablet Chew 1 tablet by mouth daily.   Yes [provider]  Calcium Citrate-Vitamin D ([employer] CALCIUM CITRATE +D3 MINI PO) Take 1 capsule by mouth daily.   Yes [provider]  cyanocobalamin (VITAMIN B12) 1000 MCG tablet Take 3,000 mcg by mouth daily.   Yes [provider]  diltiazem (DILT-XR) 180 MG 24 hr capsule TAKE 1 CAPSULE(180 MG) BY MOUTH DAILY 10/21/22  Yes Adrian Prows, MD  Fluocinolone Acetonide 0.01 % OIL Place 5 drops in ear(s) daily. 07/25/22  Yes [provider]  insulin degludec (TRESIBA FLEXTOUCH) 100 UNIT/ML FlexTouch Pen Inject 8 Units into the skin at bedtime. 05/13/22  Yes [provider]  lactose free nutrition (BOOST) LIQD Take 237 mLs by mouth daily.   Yes [provider]  levothyroxine (SYNTHROID) 88 MCG tablet Take 88  mcg by mouth daily. 07/25/22  Yes [provider]  metoprolol succinate (TOPROL-XL) 50 MG 24 hr tablet Take 1 tablet (50 mg total) by mouth daily. 06/20/22  Yes Adrian Prows, MD  rosuvastatin (CRESTOR) 20 MG tablet Take 1 tablet (20 mg total) by mouth daily. 06/20/22 12/17/22 Yes Adrian Prows, MD  sitaGLIPtin (JANUVIA) 50 MG tablet Take 50 mg by mouth daily.   Yes [provider]  Tiotropium Bromide-Olodaterol (STIOLTO RESPIMAT) 2.5-2.5 MCG/ACT AERS Inhale 2 puffs into the lungs daily. 08/26/22  Yes Freddi Starr, MD  Insulin Pen Needle (NOVOFINE PLUS PEN NEEDLE) 32G X 4 MM MISC ONE 04/10/22   [provider]  pantoprazole (PROTONIX) 40 MG tablet Take 1 tablet (40 mg total) by mouth daily. Patient not taking: Reported on 11/12/2022 08/26/22   Freddi Starr, MD    Physical Exam: Vitals:   11/12/22 0624 11/12/22 0757 11/12/22 1001 11/12/22 1341  BP: 115/77 118/72 122/75 130/70  Pulse: 81 77 70 68  Resp: 16 17 16 16   Temp: 97.8 F (36.6 C) 98 F (36.7 C) 97.8 F (36.6 C) 98 F (36.7 C)  TempSrc:  Oral Oral Oral Oral  SpO2: 100% 100% 100% 100%      Constitutional: NAD, calm, comfortable, elderly frail Eyes: PERRL, lids and conjunctivae normal ENMT: Mucous membranes are moist. Posterior pharynx clear of any exudate or lesions.Normal dentition.  Neck: normal, supple, no masses, no thyromegaly Respiratory: Minimal bibasilar crackles Cardiovascular: Regular rate and rhythm, no murmurs / rubs / gallops. No extremity edema. 2+ pedal pulses. No carotid bruits.  Abdomen: no tenderness, no masses palpated. No hepatosplenomegaly. Bowel sounds positive.  Musculoskeletal: no clubbing / cyanosis. No joint deformity upper and lower extremities. Good ROM, no contractures. Normal muscle tone.  Skin: no rashes, lesions, ulcers. No induration Neurologic: CN 2-12 grossly intact. Sensation intact, DTR normal. Strength 5/5 in all 4.  Psychiatric: Normal judgment and insight.  Alert and oriented x 3. Normal mood.     Labs on Admission: I have personally reviewed following labs and imaging studies  CBC: Recent Labs  Lab 11/11/22 1158  WBC 6.4  NEUTROABS 4.1  HGB 10.8*  HCT 32.6*  MCV 88.8  PLT 696*   Basic Metabolic Panel: Recent Labs  Lab 11/11/22 1158  NA 138  K 3.8  CL 105  CO2 24  GLUCOSE 144*  BUN 19  CREATININE 1.23*  CALCIUM 9.2   GFR: CrCl cannot be calculated (Unknown ideal weight.). Liver Function Tests: No results for input(s): "AST", "ALT", "ALKPHOS", "BILITOT", "PROT", "ALBUMIN" in the last 168 hours. No results for input(s): "LIPASE", "AMYLASE" in the last 168 hours. No results for input(s): "AMMONIA" in the last 168 hours. Coagulation Profile: No results for input(s): "INR", "PROTIME" in the last 168 hours. Cardiac Enzymes: No results for input(s): "CKTOTAL", "CKMB", "CKMBINDEX", "TROPONINI" in the last 168 hours. BNP (last 3 results) No results for input(s): "PROBNP" in the last 8760 hours. HbA1C: No results for input(s): "HGBA1C" in the last 72 hours. CBG: No results for input(s): "GLUCAP" in the last 168 hours. Lipid Profile: No results for input(s): "CHOL", "HDL", "LDLCALC", "TRIG", "CHOLHDL", "LDLDIRECT" in the last 72 hours. Thyroid Function Tests: No results for input(s): "TSH", "T4TOTAL", "FREET4", "T3FREE", "THYROIDAB" in the last 72 hours. Anemia Panel: No results for input(s): "VITAMINB12", "FOLATE", "FERRITIN", "TIBC", "IRON", "RETICCTPCT" in the last 72 hours. Urine analysis:    Component Value Date/Time   COLORURINE YELLOW 07/08/2017 0306   APPEARANCEUR CLEAR 07/08/2017 0306   LABSPEC 1.024 07/08/2017 0306   PHURINE 5.0 07/08/2017 0306   GLUCOSEU NEGATIVE 07/08/2017 0306   HGBUR NEGATIVE 07/08/2017 0306   BILIRUBINUR NEGATIVE 07/08/2017 0306   KETONESUR NEGATIVE 07/08/2017 0306   PROTEINUR NEGATIVE 07/08/2017 0306   UROBILINOGEN 1.0 04/17/2015 1838   NITRITE NEGATIVE 07/08/2017 0306    LEUKOCYTESUR TRACE (A) 07/08/2017 0306   Sepsis Labs: !!!!!!!!!!!!!!!!!!!!!!!!!!!!!!!!!!!!!!!!!!!! @LABRCNTIP (procalcitonin:4,lacticidven:4) )No results found for this or any previous visit (from the past 240 hour(s)).   Radiological Exams on Admission: DG Chest 2 View  Result Date: 11/11/2022 CLINICAL DATA:  Shortness of breath EXAM: CHEST - 2 VIEW COMPARISON:  12/30/2021 FINDINGS: Stable heart size. Aortic atherosclerosis. Streaky bilateral perihilar and bibasilar opacities, may reflect atelectasis. Small layering bilateral pleural effusions. Known bilateral pulmonary nodules, less conspicuous on the current exam. No pneumothorax. IMPRESSION: Streaky bilateral perihilar and bibasilar opacities, may reflect atelectasis. Small layering bilateral pleural effusions. Electronically Signed   By: Davina Poke D.O.   On: 11/11/2022 12:42     All images have been reviewed by me personally.  EKG: Independently reviewed.   Assessment/Plan Principal Problem:   Hypoxia Active Problems:  Carcinoid tumor of lung   Atrial fibrillation (HCC) CHA2DS2-VASc Score 7   HTN (hypertension)   DM2 (diabetes mellitus, type 2) (HCC)   CAD (coronary artery disease)   Anemia   Dyspnea on exertion - Patient admitted to the hospital under observation.  Etiology is unclear as this could be secondary to her lung cancer, mild CHF exacerbation, worsening pulmonary artery hypertension, amio toxicity (used to be on it?).  For now we will place her on scheduled and as needed bronchodilators/Xopenex, I-S/flutter valve.  Supplemental oxygen as this is giving her the most relief. -Slight concern of aspiration therefore will get speech and swallow evaluation.  Acute congestive heart failure with preserved ejection fraction, EF 55%.  Class III - BNP minimally elevated, has bilateral pleural effusion on chest x-ray.  Will give gentle diuresis Lasix 20 mg IV X2 doses and watch her symptoms.  Replete electrolytes, fluid  restriction.  Echocardiogram March 2021 showed EF 55%, dilated cardiomyopathy, grade 2 DD.  We will repeat echocardiogram  Permanent atrial fibrillation -She is on Toprol-XL and Cardizem.  We will continue.  IV as needed ordered.  Discussed with Dr Einar Gip, will resume her on study medication = active medication with either Eliquis or Asundexian. Discussed with ED pharmacist who will put in order as nonformulary  History of CAD status post PCI - Currently chest pain-free.  Stage IV low-grade lung carcinoid, neuroendocrine - Not on any treatment.  But outpatient pulmonary, Dr Erin Fulling, has prescribed her PPI and Stiolto for supportive care, May need outpatient PFTs  Hypothyroidism - Continue Synthroid  Diabetes mellitus type 2 - Continue home Tradjenta.  We will place her on sliding scale and Accu-Cheks  GERD - PPI  Hyperlipidemia - Statin  COVID/Flu screening pending.   DVT prophylaxis: Lovenox Code Status: DNR Family Communication: Son is at bedside Consults called: Spoke with Dr Einar Gip Admission status: Obs tele  Status is: Observation The patient remains OBS appropriate and will d/c before 2 midnights.   Time Spent: 65 minutes.  >50% of the time was devoted to discussing the patients care, assessment, plan and disposition with other care givers along with counseling the patient about the risks and benefits of treatment.    Alicya Bena Arsenio Loader MD [facility]  If 7PM-7AM, please contact night-coverage   11/12/2022, 2:45 PM

## 2022-11-12 NOTE — ED Notes (Signed)
Ambulated pt.  Pt O2 sats remained 97-100%, she did become tachycardic in the 140s and was also extremely short of breath.  MD aware.

## 2022-11-12 NOTE — Progress Notes (Signed)
Overnight event   Notified by RN that pt now in A.fib RVR with rates up to 130-150. Pt asymptomatic other than some shortness of breath which is her originally presenting symptom.  Pt has not yet taken her home dose of beta-blocker and CCB tonight. Advised RN to give home dose and reassess in 30 mins. If HR remains elevated at reassessment or if she becomes more symptomatic then can give PRN Lopressor already ordered by daytime physician.

## 2022-11-12 NOTE — ED Notes (Addendum)
Pt noted to have entered into A-fib with a RVR of 130-155. Pt reports a mild increase in ShOB. Denies CP. Mentation at baseline without change. Admitting MD paged at this time.

## 2022-11-12 NOTE — Progress Notes (Incomplete)
Echocardiogram 2D Echocardiogram has been performed.  [name] 11/12/2022, 4:56 PM

## 2022-11-13 DIAGNOSIS — M542 Cervicalgia: Secondary | ICD-10-CM | POA: Diagnosis not present

## 2022-11-13 DIAGNOSIS — R0602 Shortness of breath: Secondary | ICD-10-CM | POA: Diagnosis present

## 2022-11-13 DIAGNOSIS — J90 Pleural effusion, not elsewhere classified: Secondary | ICD-10-CM | POA: Diagnosis not present

## 2022-11-13 DIAGNOSIS — K5730 Diverticulosis of large intestine without perforation or abscess without bleeding: Secondary | ICD-10-CM | POA: Diagnosis not present

## 2022-11-13 DIAGNOSIS — R1310 Dysphagia, unspecified: Secondary | ICD-10-CM | POA: Diagnosis not present

## 2022-11-13 DIAGNOSIS — Z66 Do not resuscitate: Secondary | ICD-10-CM | POA: Diagnosis present

## 2022-11-13 DIAGNOSIS — E785 Hyperlipidemia, unspecified: Secondary | ICD-10-CM | POA: Diagnosis present

## 2022-11-13 DIAGNOSIS — C7A090 Malignant carcinoid tumor of the bronchus and lung: Secondary | ICD-10-CM | POA: Diagnosis present

## 2022-11-13 DIAGNOSIS — D3A090 Benign carcinoid tumor of the bronchus and lung: Secondary | ICD-10-CM | POA: Diagnosis not present

## 2022-11-13 DIAGNOSIS — N1832 Chronic kidney disease, stage 3b: Secondary | ICD-10-CM | POA: Diagnosis present

## 2022-11-13 DIAGNOSIS — I4821 Permanent atrial fibrillation: Secondary | ICD-10-CM | POA: Diagnosis present

## 2022-11-13 DIAGNOSIS — Z20822 Contact with and (suspected) exposure to covid-19: Secondary | ICD-10-CM | POA: Diagnosis present

## 2022-11-13 DIAGNOSIS — J811 Chronic pulmonary edema: Secondary | ICD-10-CM | POA: Diagnosis not present

## 2022-11-13 DIAGNOSIS — E039 Hypothyroidism, unspecified: Secondary | ICD-10-CM | POA: Diagnosis present

## 2022-11-13 DIAGNOSIS — I708 Atherosclerosis of other arteries: Secondary | ICD-10-CM | POA: Diagnosis present

## 2022-11-13 DIAGNOSIS — E43 Unspecified severe protein-calorie malnutrition: Secondary | ICD-10-CM | POA: Diagnosis present

## 2022-11-13 DIAGNOSIS — J9601 Acute respiratory failure with hypoxia: Secondary | ICD-10-CM | POA: Diagnosis present

## 2022-11-13 DIAGNOSIS — K224 Dyskinesia of esophagus: Secondary | ICD-10-CM | POA: Diagnosis not present

## 2022-11-13 DIAGNOSIS — R54 Age-related physical debility: Secondary | ICD-10-CM | POA: Diagnosis not present

## 2022-11-13 DIAGNOSIS — Z515 Encounter for palliative care: Secondary | ICD-10-CM | POA: Diagnosis not present

## 2022-11-13 DIAGNOSIS — E1122 Type 2 diabetes mellitus with diabetic chronic kidney disease: Secondary | ICD-10-CM | POA: Diagnosis present

## 2022-11-13 DIAGNOSIS — I808 Phlebitis and thrombophlebitis of other sites: Secondary | ICD-10-CM | POA: Diagnosis present

## 2022-11-13 DIAGNOSIS — M79601 Pain in right arm: Secondary | ICD-10-CM | POA: Diagnosis not present

## 2022-11-13 DIAGNOSIS — N281 Cyst of kidney, acquired: Secondary | ICD-10-CM | POA: Diagnosis not present

## 2022-11-13 DIAGNOSIS — J9811 Atelectasis: Secondary | ICD-10-CM | POA: Diagnosis present

## 2022-11-13 DIAGNOSIS — R531 Weakness: Secondary | ICD-10-CM | POA: Diagnosis not present

## 2022-11-13 DIAGNOSIS — I2721 Secondary pulmonary arterial hypertension: Secondary | ICD-10-CM | POA: Diagnosis present

## 2022-11-13 DIAGNOSIS — R933 Abnormal findings on diagnostic imaging of other parts of digestive tract: Secondary | ICD-10-CM | POA: Diagnosis not present

## 2022-11-13 DIAGNOSIS — N179 Acute kidney failure, unspecified: Secondary | ICD-10-CM | POA: Diagnosis present

## 2022-11-13 DIAGNOSIS — R112 Nausea with vomiting, unspecified: Secondary | ICD-10-CM | POA: Diagnosis not present

## 2022-11-13 DIAGNOSIS — E222 Syndrome of inappropriate secretion of antidiuretic hormone: Secondary | ICD-10-CM | POA: Diagnosis present

## 2022-11-13 DIAGNOSIS — I350 Nonrheumatic aortic (valve) stenosis: Secondary | ICD-10-CM | POA: Diagnosis present

## 2022-11-13 DIAGNOSIS — M25531 Pain in right wrist: Secondary | ICD-10-CM | POA: Diagnosis not present

## 2022-11-13 DIAGNOSIS — D631 Anemia in chronic kidney disease: Secondary | ICD-10-CM | POA: Diagnosis present

## 2022-11-13 DIAGNOSIS — Z794 Long term (current) use of insulin: Secondary | ICD-10-CM | POA: Diagnosis not present

## 2022-11-13 DIAGNOSIS — J9690 Respiratory failure, unspecified, unspecified whether with hypoxia or hypercapnia: Secondary | ICD-10-CM | POA: Diagnosis not present

## 2022-11-13 DIAGNOSIS — I130 Hypertensive heart and chronic kidney disease with heart failure and stage 1 through stage 4 chronic kidney disease, or unspecified chronic kidney disease: Secondary | ICD-10-CM | POA: Diagnosis present

## 2022-11-13 DIAGNOSIS — R0902 Hypoxemia: Secondary | ICD-10-CM | POA: Diagnosis not present

## 2022-11-13 DIAGNOSIS — I420 Dilated cardiomyopathy: Secondary | ICD-10-CM | POA: Diagnosis present

## 2022-11-13 DIAGNOSIS — R627 Adult failure to thrive: Secondary | ICD-10-CM | POA: Diagnosis present

## 2022-11-13 DIAGNOSIS — K3189 Other diseases of stomach and duodenum: Secondary | ICD-10-CM | POA: Diagnosis not present

## 2022-11-13 DIAGNOSIS — L539 Erythematous condition, unspecified: Secondary | ICD-10-CM | POA: Diagnosis not present

## 2022-11-13 DIAGNOSIS — I5033 Acute on chronic diastolic (congestive) heart failure: Secondary | ICD-10-CM | POA: Diagnosis present

## 2022-11-13 LAB — CBC
HCT: 29.4 % — ABNORMAL LOW (ref 36.0–46.0)
Hemoglobin: 10 g/dL — ABNORMAL LOW (ref 12.0–15.0)
MCH: 30.4 pg (ref 26.0–34.0)
MCHC: 34 g/dL (ref 30.0–36.0)
MCV: 89.4 fL (ref 80.0–100.0)
Platelets: 110 10*3/uL — ABNORMAL LOW (ref 150–400)
RBC: 3.29 MIL/uL — ABNORMAL LOW (ref 3.87–5.11)
RDW: 13.7 % (ref 11.5–15.5)
WBC: 4.4 10*3/uL (ref 4.0–10.5)
nRBC: 0 % (ref 0.0–0.2)

## 2022-11-13 LAB — BASIC METABOLIC PANEL
Anion gap: 10 (ref 5–15)
BUN: 19 mg/dL (ref 8–23)
CO2: 24 mmol/L (ref 22–32)
Calcium: 8.4 mg/dL — ABNORMAL LOW (ref 8.9–10.3)
Chloride: 100 mmol/L (ref 98–111)
Creatinine, Ser: 1.25 mg/dL — ABNORMAL HIGH (ref 0.44–1.00)
GFR, Estimated: 42 mL/min — ABNORMAL LOW (ref >60)
Glucose, Bld: 203 mg/dL — ABNORMAL HIGH (ref 70–99)
Potassium: 3.3 mmol/L — ABNORMAL LOW (ref 3.5–5.1)
Sodium: 134 mmol/L — ABNORMAL LOW (ref 135–145)

## 2022-11-13 LAB — ECHOCARDIOGRAM COMPLETE
AR max vel: 1.36 cm2
AV Area VTI: 1.37 cm2
AV Area mean vel: 1.39 cm2
AV Mean grad: 22.8 mmHg
AV Peak grad: 37.4 mmHg
Ao pk vel: 3.06 m/s
MV VTI: 2.03 cm2
S' Lateral: 1.7 cm

## 2022-11-13 LAB — GLUCOSE, CAPILLARY
Glucose-Capillary: 140 mg/dL — ABNORMAL HIGH (ref 70–99)
Glucose-Capillary: 199 mg/dL — ABNORMAL HIGH (ref 70–99)
Glucose-Capillary: 178 mg/dL — ABNORMAL HIGH (ref 70–99)
Glucose-Capillary: 186 mg/dL — ABNORMAL HIGH (ref 70–99)

## 2022-11-13 LAB — MAGNESIUM: Magnesium: 1.2 mg/dL — ABNORMAL LOW (ref 1.7–2.4)

## 2022-11-13 LAB — PROCALCITONIN: Procalcitonin: 0.12 ng/mL

## 2022-11-13 MED ORDER — FUROSEMIDE 10 MG/ML IJ SOLN: 20.0000 mg | Freq: Two times a day (BID) | INTRAMUSCULAR | Status: DC

## 2022-11-13 MED ORDER — AZITHROMYCIN 500 MG PO TABS
500.0000 mg | ORAL_TABLET | Freq: Every day | ORAL | Status: DC
  Administered 2022-11-13: 500 mg via ORAL
  Filled 2022-11-13: qty 1

## 2022-11-13 MED ORDER — PANTOPRAZOLE SODIUM 40 MG PO TBEC
40.0000 mg | DELAYED_RELEASE_TABLET | Freq: Two times a day (BID) | ORAL | Status: DC
  Administered 2022-11-13: 40 mg via ORAL
  Filled 2022-11-13: qty 1

## 2022-11-13 MED ORDER — FUROSEMIDE 10 MG/ML IJ SOLN
20.0000 mg | Freq: Every day | INTRAMUSCULAR | Status: DC
  Administered 2022-11-13: 20 mg via INTRAVENOUS
  Filled 2022-11-13: qty 2

## 2022-11-13 MED ORDER — MAGNESIUM SULFATE 2 GM/50ML IV SOLN
2.0000 g | Freq: Once | INTRAVENOUS | Status: AC
  Administered 2022-11-13: 2 g via INTRAVENOUS
  Filled 2022-11-13: qty 50

## 2022-11-13 MED ORDER — BUDESONIDE 0.25 MG/2ML IN SUSP
0.2500 mg | Freq: Two times a day (BID) | RESPIRATORY_TRACT | Status: DC
  Administered 2022-11-14 – 2022-11-15 (×3): 0.25 mg via RESPIRATORY_TRACT
  Filled 2022-11-13 (×3): qty 2

## 2022-11-13 MED ORDER — POTASSIUM CHLORIDE 20 MEQ PO PACK
20.0000 meq | PACK | Freq: Two times a day (BID) | ORAL | Status: AC
  Administered 2022-11-13 (×2): 20 meq via ORAL
  Filled 2022-11-13 (×2): qty 1

## 2022-11-13 MED ORDER — AMIODARONE HCL 200 MG PO TABS: 100.0000 mg | ORAL_TABLET | Freq: Every day | ORAL | Status: DC

## 2022-11-13 NOTE — Evaluation (Signed)
Clinical/Bedside Swallow Evaluation Patient Details  Name: [name] MRN: [id number] Date of Birth: 03/10/1934  Today's Date: 11/13/2022 Time: SLP Start Time (ACUTE ONLY): 4 SLP Stop Time (ACUTE ONLY): 4742 SLP Time Calculation (min) (ACUTE ONLY): 23 min  Past Medical History:  Past Medical History:  Diagnosis Date   Acute diverticulitis 06/2017   Anemia 06/2017   ARF (acute renal failure) ([place]) 06/2017   CAD (coronary artery disease) 03/13/2012   Diabetes mellitus    Dysrhythmia    ATRIAL FIB   Fall 07/07/2017   HTN (hypertension) 03/13/2012   Hypothyroid 03/13/2012   Lung cancer ([place])    Shortness of breath    Stroke ([place])    Syncope and collapse 04/02/2016   Type II or unspecified type diabetes mellitus without mention of complication, not stated as uncontrolled 03/13/2012   Past Surgical History:  Past Surgical History:  Procedure Laterality Date   ABDOMINAL HYSTERECTOMY     BIOPSY  06/13/2021   Procedure: BIOPSY;  Surgeon: Ronnette Juniper, MD;  [facility];  Service: Gastroenterology;;   BREAST EXCISIONAL BIOPSY Left    BREAST SURGERY     benign tumors removed in [place]  04/20/2012   Procedure: CARDIOVERSION;  Surgeon: Laverda Page, MD;  Location: [place];  Service: Cardiovascular;  Laterality: N/A;   CARDIOVERSION N/A 05/10/2013   Procedure: CARDIOVERSION;  Surgeon: Laverda Page, MD;  Location: [place];  Service: Cardiovascular;  Laterality: N/A;   CHOLECYSTECTOMY     COLONOSCOPY WITH PROPOFOL N/A 06/13/2021   Procedure: COLONOSCOPY WITH PROPOFOL;  Surgeon: Ronnette Juniper, MD;  [facility];  Service: Gastroenterology;  Laterality: N/A;   ESOPHAGOGASTRODUODENOSCOPY (EGD) WITH PROPOFOL N/A 06/13/2021   Procedure: ESOPHAGOGASTRODUODENOSCOPY (EGD) WITH PROPOFOL;  Surgeon: Ronnette Juniper, MD;  [facility];  Service: Gastroenterology;  Laterality: N/A;   HEMORRHOID SURGERY     jaw replacement      LEFT HEART CATHETERIZATION WITH CORONARY ANGIOGRAM N/A 04/20/2015   Procedure: LEFT HEART CATHETERIZATION WITH CORONARY ANGIOGRAM;  Surgeon: Adrian Prows, MD;  Location: [facility] CATH LAB;  Service: Cardiovascular;  Laterality: N/A;   tumors     back of head   HPI:  Pt is a 88 y.o. female who presented to ED with complaints of shortness of breath. Pt and son tells me that off-and-on she has had issues with some choking and coughing along with exertional dyspnea even if she walks a few steps going anywhere.  CXR (11/14) showed streaky opacity and small bilateral pleural effusion. Upper GI endoscopy (05/2021) revealed normal esophagus. PMH: CAD, pulmonary hypertension, right subclavian artery stenosis, permanent atrial fibrillation, DM 2, HTN, stage IV low grade lung carcinoid/neuroendocrine.    Assessment / Plan / Recommendation  Clinical Impression  Pt presents with functional oropharyngeal swallow at bedside, though concerned for possible esophageal component and pt reports of intermittent choking when eating (at [facility]) if she is "not careful". Oral mechanism examination unremarkable, upper partial placed. She self-fed 3-4 consecutive swallows of thin liquids by straw across x3 attempts without clinical signs of aspiration. She reported transient globus sensation with bites of puree, regular solid masticated and cleared without difficulty. She reports hx of acid reflux and taking antacid tablets to manage. Question an esophageal component. Discussed this with MD who entered room at end of evaluation. Recommend continue regular diet/thin liquids with adherence to universal swallow/reflux precautions. Will f/u briefly for tolerance.  SLP Visit Diagnosis: Dysphagia, unspecified (R13.10)  Aspiration Risk  Mild aspiration risk    Diet Recommendation Regular;Thin liquid   Liquid Administration via: Cup;Straw Medication Administration: Whole meds with liquid Supervision: Patient able to self  feed Compensations: Minimize environmental distractions;Slow rate;Small sips/bites;Follow solids with liquid Postural Changes: Remain upright for at least 30 minutes after po intake;Seated upright at 90 degrees    Other  Recommendations Recommended Consults: Consider esophageal assessment Oral Care Recommendations: Oral care BID    Recommendations for follow up therapy are one component of a multi-disciplinary discharge planning process, led by the attending physician.  Recommendations may be updated based on patient status, additional functional criteria and insurance authorization.  Follow up Recommendations Other (comment) (TBD)      Assistance Recommended at Discharge    Functional Status Assessment Patient has had a recent decline in their functional status and demonstrates the ability to make significant improvements in function in a reasonable and predictable amount of time.  Frequency and Duration min 2x/week  2 weeks       Prognosis Prognosis for Safe Diet Advancement: Good Barriers to Reach Goals: Time post onset      Swallow Study   General Date of Onset: 11/13/22 HPI: Pt is a 88 y.o. female who presented to ED with complaints of shortness of breath. Pt and son tells me that off-and-on she has had issues with some choking and coughing along with exertional dyspnea even if she walks a few steps going anywhere.  CXR (11/14) showed streaky opacity and small bilateral pleural effusion. Upper GI endoscopy (05/2021) revealed normal esophagus. PMH: CAD, pulmonary hypertension, right subclavian artery stenosis, permanent atrial fibrillation, DM 2, HTN, stage IV low grade lung carcinoid/neuroendocrine. Type of Study: Bedside Swallow Evaluation Previous Swallow Assessment: none per EMR Diet Prior to this Study: Regular;Thin liquids Temperature Spikes Noted: No Respiratory Status: Nasal cannula History of Recent Intubation: No Behavior/Cognition: Alert;Cooperative;Pleasant mood Oral  Cavity Assessment: Within Functional Limits Oral Care Completed by SLP: No Oral Cavity - Dentition: Adequate natural dentition (partial, top) Vision: Functional for self-feeding Self-Feeding Abilities: Able to feed self Patient Positioning: Upright in bed;Postural control adequate for testing Baseline Vocal Quality: Normal Volitional Cough: Strong Volitional Swallow: Able to elicit    Oral/Motor/Sensory Function Overall Oral Motor/Sensory Function: Within functional limits   Ice Chips Ice chips: Not tested   Thin Liquid Thin Liquid: Within functional limits Presentation: Self Fed;Straw    Nectar Thick Nectar Thick Liquid: Not tested   Honey Thick Honey Thick Liquid: Not tested   Puree Puree: Within functional limits Presentation: Self Fed;Spoon   Solid     Solid: Within functional limits Presentation: [place], [place], [place] Office Number: [phone]  Acie Fredrickson 11/13/2022,10:52 AM

## 2022-11-13 NOTE — Evaluation (Signed)
Occupational Therapy Evaluation Patient Details Name: [name] MRN: [id number] DOB: 10-17-1934 Today's Date: 11/13/2022   History of Present Illness Pt is 88 year old presented to [facility] on  11/11/22 with SOB. Work up in progress to determine etiology. Pt developed afib with RVR in ED. PMH - CAD, pulmonary htn, afib, dm, htn, lung CA, stroke.   Clinical Impression   Patient admitted for the diagnosis above.  PTA she lives alone, but has any assist needed from family.  Patient able to mobilize in the hall at [facility] level, then perform light stand grooming task.  O2 remained fairly steady at 93% on RA, dropping once to 90% while walking.  No significant OT needs in the acute setting, and no post acute OT anticipated.       Recommendations for follow up therapy are one component of a multi-disciplinary discharge planning process, led by the attending physician.  Recommendations may be updated based on patient status, additional functional criteria and insurance authorization.   Follow Up Recommendations  No OT follow up     Assistance Recommended at Discharge Set up Supervision/Assistance  Patient can return home with the following Assist for transportation    Functional Status Assessment  Patient has not had a recent decline in their functional status  Equipment Recommendations  None recommended by OT    Recommendations for Other Services       Precautions / Restrictions Precautions Precautions: Fall Precaution Comments: O2 sats Restrictions Weight Bearing Restrictions: No      Mobility Bed Mobility               General bed mobility comments: up in recliner    Transfers Overall transfer level: Needs assistance Equipment used: Rolling walker (2 wheels) Transfers: Sit to/from Stand, Bed to chair/wheelchair/BSC Sit to Stand: Supervision     Step pivot transfers: Supervision            Balance Overall balance assessment: Needs assistance Sitting-balance  support: Feet supported Sitting balance-Leahy Scale: Good     Standing balance support: Reliant on assistive device for balance Standing balance-Leahy Scale: Fair                             ADL either performed or assessed with clinical judgement   ADL Overall ADL's : At baseline                                             Vision Baseline Vision/History: 1 Wears glasses Patient Visual Report: No change from baseline       Perception     Praxis      Pertinent Vitals/Pain Pain Assessment Pain Assessment: No/denies pain     Hand Dominance Left   Extremity/Trunk Assessment Upper Extremity Assessment Upper Extremity Assessment: Overall WFL for tasks assessed   Lower Extremity Assessment Lower Extremity Assessment: Defer to PT evaluation   Cervical / Trunk Assessment Cervical / Trunk Assessment: Kyphotic   Communication Communication Communication: HOH   Cognition Arousal/Alertness: Awake/alert Behavior During Therapy: WFL for tasks assessed/performed Overall Cognitive Status: Within Functional Limits for tasks assessed                                       General  Comments  Energy conservation and PLB reviewed with good teach back    Exercises     Shoulder Instructions      Home Living Family/patient expects to be discharged to:: Private residence Living Arrangements: Alone Available Help at Discharge: Family;Available PRN/intermittently Type of Home: House Home Access: Stairs to enter [employer] of Steps: 8, back porch Entrance Stairs-Rails: Right;Left;Can reach both Home Layout: Able to live on main level with bedroom/bathroom;Multi-level     Bathroom Shower/Tub: Teacher, early years/pre: Standard Bathroom Accessibility: Yes   Home Equipment: Conservation officer, nature (2 wheels);Cane - single point;BSC/3in1;Shower seat;Grab bars - tub/shower;Rollator (4 wheels)          Prior  Functioning/Environment Prior Level of Function : Independent/Modified Independent             Mobility Comments: Uses cane or walker or furniture walks. Stays in the house unless family there to assist, RW for MD appointments ADLs Comments: Continues to care for her ADL, family assists with meals, home management and community mobility.        OT Problem List: Decreased activity tolerance      OT Treatment/Interventions:      OT Goals(Current goals can be found in the care plan section) Acute Rehab OT Goals Patient Stated Goal: Hoping to go home tomorrow OT Goal Formulation: With patient Time For Goal Achievement: 11/17/22 Potential to Achieve Goals: Good  OT Frequency:      Co-evaluation              AM-PAC OT "6 Clicks" Daily Activity     Outcome Measure Help from another person eating meals?: None Help from another person taking care of personal grooming?: None Help from another person toileting, which includes using toliet, bedpan, or urinal?: A Little Help from another person bathing (including washing, rinsing, drying)?: A Little Help from another person to put on and taking off regular upper body clothing?: None Help from another person to put on and taking off regular lower body clothing?: A Little 6 Click Score: 21   End of Session Equipment Utilized During Treatment: Rolling walker (2 wheels) Nurse Communication: Mobility status  Activity Tolerance: Patient tolerated treatment well Patient left: in chair;with call bell/phone within reach;with chair alarm set  OT Visit Diagnosis: Unsteadiness on feet (R26.81)                Time: 7124-5809 OT Time Calculation (min): 33 min Charges:  OT General Charges $OT Visit: 1 Visit OT Evaluation $OT Eval Moderate Complexity: 1 Mod OT Treatments $Self Care/Home Management : 8-22 mins  11/13/2022  RP, OTR/L  Acute Rehabilitation Services  Office:  [phone]   Metta Clines 11/13/2022, 3:22 PM

## 2022-11-13 NOTE — Progress Notes (Signed)
PROGRESS NOTE    [name]  VWU:[id number] DOB: [birth year] DOA: 11/11/2022 PCP: [name], FNP   Brief Narrative: 88 year old past medical history significant for CAD, pulmonary hypertension, right subclavian artery stenosis, permanent A-fib, diabetes type 2, hypertension, stage IV low-grade Carcinoid/neuroendocrine presents complaining of worsening shortness of breath on exertion over the last past several months, but worse over the last several days was advised to present to the ED by Dr. Einar Gip. [name] also reports some chocking episodes with meals. CTA negative for PE, moderate right and small left pleural effusion with associated atelectasis, and newly mildly enlarged mediastinal and right hilar lymph node reactive need CT follow-up.  Numerous bilateral solid pulmonary nodules unchanged from 2018.   Assessment & Plan:   Principal Problem:   Hypoxia Active Problems:   Carcinoid tumor of lung   Atrial fibrillation (HCC) CHA2DS2-VASc Score 7   HTN (hypertension)   DM2 (diabetes mellitus, type 2) (HCC)   CAD (coronary artery disease)   Anemia   1-Dyspnea on Exertion:  CTA chest with moderate right pleural effusion.  Continue with IV lasix.  Pulmonology consulted for evaluation of Amiodarone toxicity ? , as recommended by Dr Einar Gip.  Check home oxygen needs.  Continue with nebulizer.  CCM consulted.   2-Acute Hypoxic resp failure.  Multifactorial, secondary to pleural effusion, acute on chronic diastolic HF, Low grade lung neuroendocrine tumor.  Continue with IV lasix, 20 mg IV BID Started on Pulmicort nebulizer.   3-Acute on Chronic Diastolic HF  Presents with dyspnea, pleural effusion, increase BNP.  Started on IV lasix, continue BID  H/O CAD; S/P PCI Denies chest pain.   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  On Stiolto-- change to Pulmicort by pulmonologist   GERD; PPI change to BID  Dysphagia; evaluated by speech, continue with regular diet.   Hypomagnesemia; replete IV.  Hypokalemia; replete orally.  A fib; Continue with Cardizem and metoprolol./   Estimated body mass index is 22.05 kg/m as calculated from the following:   Height as of this encounter: 5' 2.01" (1.575 m).   Weight as of this encounter: 54.7 kg.   DVT prophylaxis: Lovenox Code Status: DNR Family Communication: Disposition Plan:  Status is: Inpatient Remains inpatient appropriate because: management of Resp failure    Consultants:  CCM Dr Einar Gip  Procedures:  none  Antimicrobials:    Subjective: She is feeling better, breathing improved.   Objective: Vitals:   11/13/22 0509 11/13/22 0600 11/13/22 0800 11/13/22 0938  BP:  127/71 (!) 141/87   Pulse:  88 90   Resp:  (!) 24 11   Temp: 97.7 F (36.5 C) (!) 97.4 F (36.3 C) 97.6 F (36.4 C)   TempSrc: Oral Oral Oral   SpO2:  94% 94% 97%  Weight:  54.7 kg    Height:  5' 2.01" (1.575 m)      Intake/Output Summary (Last 24 hours) at 11/13/2022 1039 Last data filed at 11/13/2022 0900 Gross per 24 hour  Intake 406.71 ml  Output 950 ml  Net -543.29 ml   Filed Weights   11/13/22 0600  Weight: 54.7 kg    Examination:  General exam: Appears calm and comfortable  Respiratory system: Clear to auscultation. Respiratory effort normal. Cardiovascular system: S1 & S2 heard, IRR Gastrointestinal system: Abdomen is nondistended, soft and nontender. No organomegaly or masses felt. Normal bowel sounds heard. Central nervous system: Alert and oriented.  Extremities: Symmetric 5 x 5 power.   Data Reviewed:  I have personally reviewed following labs and imaging studies  CBC: Recent Labs  Lab 11/11/22 1158 11/12/22 2245 11/13/22 0348  WBC 6.4 4.9 4.4  NEUTROABS 4.1  --   --   HGB 10.8* 9.9* 10.0*  HCT 32.6* 29.4* 29.4*  MCV 88.8 87.8 89.4  PLT 118* 112* 562*   Basic Metabolic Panel: Recent Labs  Lab 11/11/22 1158 11/12/22 2245 11/13/22 0348  NA 138  --  134*  K 3.8  --  3.3*   CL 105  --  100  CO2 24  --  24  GLUCOSE 144*  --  203*  BUN 19  --  19  CREATININE 1.23* 1.37* 1.25*  CALCIUM 9.2  --  8.4*  MG  --   --  1.2*   GFR: Estimated Creatinine Clearance: 25.6 mL/min (A) (by C-G formula based on SCr of 1.25 mg/dL (H)). Liver Function Tests: No results for input(s): "AST", "ALT", "ALKPHOS", "BILITOT", "PROT", "ALBUMIN" in the last 168 hours. No results for input(s): "LIPASE", "AMYLASE" in the last 168 hours. No results for input(s): "AMMONIA" in the last 168 hours. Coagulation Profile: No results for input(s): "INR", "PROTIME" in the last 168 hours. Cardiac Enzymes: No results for input(s): "CKTOTAL", "CKMB", "CKMBINDEX", "TROPONINI" in the last 168 hours. BNP (last 3 results) No results for input(s): "PROBNP" in the last 8760 hours. HbA1C: Recent Labs    11/12/22 2245  HGBA1C 5.8*   CBG: Recent Labs  Lab 11/12/22 1913 11/12/22 2250 11/13/22 0814  GLUCAP 136* 192* 140*   Lipid Profile: No results for input(s): "CHOL", "HDL", "LDLCALC", "TRIG", "CHOLHDL", "LDLDIRECT" in the last 72 hours. Thyroid Function Tests: No results for input(s): "TSH", "T4TOTAL", "FREET4", "T3FREE", "THYROIDAB" in the last 72 hours. Anemia Panel: No results for input(s): "VITAMINB12", "FOLATE", "FERRITIN", "TIBC", "IRON", "RETICCTPCT" in the last 72 hours. Sepsis Labs: Recent Labs  Lab 11/12/22 2245  PROCALCITON 0.12    Recent Results (from the past 240 hour(s))  Resp Panel by RT-PCR (Flu A&B, Covid) Anterior Nasal Swab     Status: None   Collection Time: 11/12/22  1:44 PM   Specimen: Anterior Nasal Swab  Result Value Ref Range Status   SARS Coronavirus 2 by RT PCR NEGATIVE NEGATIVE Final    Comment: (NOTE) SARS-CoV-2 target nucleic acids are NOT DETECTED.  The SARS-CoV-2 RNA is generally detectable in upper respiratory specimens during the acute phase of infection. The lowest concentration of SARS-CoV-2 viral copies this assay can detect is 138  copies/mL. A negative result does not preclude SARS-Cov-2 infection and should not be used as the sole basis for treatment or other patient management decisions. A negative result may occur with  improper specimen collection/handling, submission of specimen other than nasopharyngeal swab, presence of viral mutation(s) within the areas targeted by this assay, and inadequate number of viral copies(<138 copies/mL). A negative result must be combined with clinical observations, patient history, and epidemiological information. The expected result is Negative.  Fact Sheet for Patients:  EntrepreneurPulse.com.au  Fact Sheet for Healthcare Providers:  IncredibleEmployment.be  This test is no t yet approved or cleared by the [place] FDA and  has been authorized for detection and/or diagnosis of SARS-CoV-2 by FDA under an Emergency Use Authorization (EUA). This EUA will remain  in effect (meaning this test can be used) for the duration of the COVID-19 declaration under Section 564(b)(1) of the Act, 21 U.S.C.section 360bbb-3(b)(1), unless the authorization is terminated  or revoked sooner.  Influenza A by PCR NEGATIVE NEGATIVE Final   Influenza B by PCR NEGATIVE NEGATIVE Final    Comment: (NOTE) The Xpert Xpress SARS-CoV-2/FLU/RSV plus assay is intended as an aid in the diagnosis of influenza from Nasopharyngeal swab specimens and should not be used as a sole basis for treatment. Nasal washings and aspirates are unacceptable for Xpert Xpress SARS-CoV-2/FLU/RSV testing.  Fact Sheet for Patients: EntrepreneurPulse.com.au  Fact Sheet for Healthcare Providers: IncredibleEmployment.be  This test is not yet approved or cleared by the [place] FDA and has been authorized for detection and/or diagnosis of SARS-CoV-2 by FDA under an Emergency Use Authorization (EUA). This EUA will remain in effect (meaning  this test can be used) for the duration of the COVID-19 declaration under Section 564(b)(1) of the Act, 21 U.S.C. section 360bbb-3(b)(1), unless the authorization is terminated or revoked.  Performed at [facility], [place] [address] [place], [place] [address]          Radiology Studies: ECHOCARDIOGRAM COMPLETE  Result Date: 11/13/2022    ECHOCARDIOGRAM REPORT   Patient Name:   KELSE PLOCH Date of Exam: 11/12/2022 Medical Rec #:  [id number]        Height:       62.0 in Accession #:    [id number]       Weight:       120.6 lb Date of Birth:  03-18-1934        BSA:          1.542 m Patient Age:    67 years         BP:           130/70 mmHg Patient Gender: F                HR:           104 bpm. Exam Location:  Inpatient Procedure: 2D Echo, Cardiac Doppler and Color Doppler Indications:    Cardiomyopathy-Unspecified I42.9  History:        Patient has no prior history of Echocardiogram examinations and                 Patient has prior history of Echocardiogram examinations, most                 recent 07/09/2017. CAD, Stroke, Arrythmias:Atrial Fibrillation,                 Signs/Symptoms:Dyspnea, Shortness of Breath, Syncope and Chest                 Pain; Risk Factors:Hypertension and Diabetes.  Sonographer:    Ronny Flurry Sonographer#2:  Danne Baxter RDCS, FE, PE Referring Phys: [phone] ANKIT CHIRAG AMIN IMPRESSIONS  1. The aortic valve is calcified. There is moderate calcification of the aortic valve. There is moderate thickening of the aortic valve. Aortic valve regurgitation is mild. Moderate aortic valve stenosis. Aortic valve area, by VTI measures 1.37 cm. Aortic valve mean gradient measures 22.8 mmHg. Aortic valve Vmax measures 3.06 m/s.  2. Left ventricular ejection fraction, by estimation, is 70 to 75%. The left ventricle has hyperdynamic function. The left ventricle has no regional wall motion abnormalities. There is moderate left ventricular hypertrophy. Left ventricular  diastolic parameters are indeterminate.  3. Right ventricular systolic function is normal. The right ventricular size is normal. There is moderately elevated pulmonary artery systolic pressure. The estimated right ventricular systolic pressure is 51.7 mmHg.  4. The mitral valve is degenerative. Mild mitral valve  regurgitation. Mild mitral stenosis. The mean mitral valve gradient is 6.0 mmHg.  5. The inferior vena cava is normal in size with greater than 50% respiratory variability, suggesting right atrial pressure of 3 mmHg. FINDINGS  Left Ventricle: Left ventricular ejection fraction, by estimation, is 70 to 75%. The left ventricle has hyperdynamic function. The left ventricle has no regional wall motion abnormalities. The left ventricular internal cavity size was normal in size. There is moderate left ventricular hypertrophy. Left ventricular diastolic parameters are indeterminate. Right Ventricle: The right ventricular size is normal. No increase in right ventricular wall thickness. Right ventricular systolic function is normal. There is moderately elevated pulmonary artery systolic pressure. The tricuspid regurgitant velocity is 3.25 m/s, and with an assumed right atrial pressure of 3 mmHg, the estimated right ventricular systolic pressure is 99.2 mmHg. Left Atrium: Left atrial size was normal in size. Right Atrium: Right atrial size was normal in size. Pericardium: There is no evidence of pericardial effusion. Mitral Valve: The mitral valve is degenerative in appearance. There is mild thickening of the mitral valve leaflet(s). There is mild calcification of the mitral valve leaflet(s). Mild mitral valve regurgitation. Mild mitral valve stenosis. MV peak gradient, 17.6 mmHg. The mean mitral valve gradient is 6.0 mmHg. Tricuspid Valve: The tricuspid valve is normal in structure. Tricuspid valve regurgitation is mild . No evidence of tricuspid stenosis. Aortic Valve: The aortic valve is calcified. There is  moderate calcification of the aortic valve. There is moderate thickening of the aortic valve. Aortic valve regurgitation is mild. Moderate aortic stenosis is present. Aortic valve mean gradient measures 22.8 mmHg. Aortic valve peak gradient measures 37.4 mmHg. Aortic valve area, by VTI measures 1.37 cm. Pulmonic Valve: The pulmonic valve was normal in structure. Pulmonic valve regurgitation is trivial. No evidence of pulmonic stenosis. Aorta: The aortic root is normal in size and structure. Venous: The inferior vena cava is normal in size with greater than 50% respiratory variability, suggesting right atrial pressure of 3 mmHg. IAS/Shunts: No atrial level shunt detected by color flow Doppler.  LEFT VENTRICLE PLAX 2D LVIDd:         2.70 cm LVIDs:         1.70 cm LV PW:         1.20 cm LV IVS:        1.40 cm LVOT diam:     1.90 cm LV SV:         78 LV SV Index:   50 LVOT Area:     2.84 cm  RIGHT VENTRICLE             IVC RV S prime:     11.50 cm/s  IVC diam: 1.20 cm TAPSE (M-mode): 1.6 cm LEFT ATRIUM             Index        RIGHT ATRIUM           Index LA diam:        3.50 cm 2.27 cm/m   RA Area:     14.20 cm LA Vol (A2C):   31.9 ml 20.69 ml/m  RA Volume:   28.10 ml  18.22 ml/m LA Vol (A4C):   32.0 ml 20.75 ml/m LA Biplane Vol: 34.4 ml 22.31 ml/m  AORTIC VALVE AV Area (Vmax):    1.36 cm AV Area (Vmean):   1.39 cm AV Area (VTI):     1.37 cm AV Vmax:  305.80 cm/s AV Vmean:          226.000 cm/s AV VTI:            0.566 m AV Peak Grad:      37.4 mmHg AV Mean Grad:      22.8 mmHg LVOT Vmax:         147.00 cm/s LVOT Vmean:        110.933 cm/s LVOT VTI:          0.274 m LVOT/AV VTI ratio: 0.48  AORTA Ao Root diam: 2.90 cm Ao Asc diam:  3.00 cm MITRAL VALVE              TRICUSPID VALVE MV Area VTI:  2.03 cm    TR Peak grad:   42.2 mmHg MV Peak grad: 17.6 mmHg   TR Vmax:        325.00 cm/s MV Mean grad: 6.0 mmHg MV Vmax:      2.09 m/s    SHUNTS MV Vmean:     102.5 cm/s  Systemic VTI:  0.27 m                            Systemic Diam: 1.90 cm Candee Furbish MD Electronically signed by Candee Furbish MD Signature Date/Time: 11/13/2022/6:39:48 AM    Final    CT Angio Chest PE W and/or Wo Contrast  Result Date: 11/12/2022 CLINICAL DATA:  Pulmonary embolus suspected EXAM: CT ANGIOGRAPHY CHEST WITH CONTRAST TECHNIQUE: Multidetector CT imaging of the chest was performed using the standard protocol during bolus administration of intravenous contrast. Multiplanar CT image reconstructions and MIPs were obtained to evaluate the vascular anatomy. RADIATION DOSE REDUCTION: This exam was performed according to the departmental dose-optimization program which includes automated exposure control, adjustment of the mA and/or kV according to patient size and/or use of iterative reconstruction technique. CONTRAST:  46mL OMNIPAQUE IOHEXOL 350 MG/ML SOLN COMPARISON:  Chest CT dated July 11th 2018 FINDINGS: Cardiovascular: No evidence of pulmonary embolus. Normal heart size. No pericardial effusion. Normal caliber thoracic aorta with severe calcified plaque. Severe coronary artery calcifications. Mediastinum/Nodes: Patulous esophagus. New mildly enlarged mediastinal and right hilar lymph nodes. Reference right lower paratracheal lymph node measuring 1.0 cm in short axis on series 5, image 38, previously 0.7 cm. Reference right hilar lymph node measuring 1.4 cm on series 5, image 46, previously 0.9 cm. Lungs/Pleura: Central airways are patent. Mosaic attenuation. Scattered linear opacities are seen in the lower lungs which are likely due to scarring or atelectasis. Visualized solid pulmonary nodules are unchanged in size when compared with prior exam. Reference peribronchovascular nodule of the right middle lobe measuring 2 mm in mean diameter on series 7, image 56, unchanged. Reference left lower lobe nodule measuring 6 mm on series 7, image 33, unchanged. Moderate right and small left pleural effusions with associated atelectasis. Upper  Abdomen: No acute abnormality. Musculoskeletal: No chest wall abnormality. No acute or significant osseous findings. Review of the MIP images confirms the above findings. IMPRESSION: 1. No evidence of pulmonary embolus. 2. Moderate right and small left pleural effusions with associated atelectasis. 3. New mildly enlarged mediastinal and right hilar lymph nodes, likely reactive. Recommend follow-up chest CT in 3 months to ensure resolution. 4. 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 5. Mosaic attenuation of the lungs, likely due to small airways disease. 6. Severe  coronary artery calcifications. 7. Aortic Atherosclerosis (ICD10-I70.0). Electronically Signed   By: Yetta Glassman M.D.   On: 11/12/2022 11:04   DG Chest 2 View  Result Date: 11/11/2022 CLINICAL DATA:  Shortness of breath EXAM: CHEST - 2 VIEW COMPARISON:  12/30/2021 FINDINGS: Stable heart size. Aortic atherosclerosis. Streaky bilateral perihilar and bibasilar opacities, may reflect atelectasis. Small layering bilateral pleural effusions. Known bilateral pulmonary nodules, less conspicuous on the current exam. No pneumothorax. IMPRESSION: Streaky bilateral perihilar and bibasilar opacities, may reflect atelectasis. Small layering bilateral pleural effusions. Electronically Signed   By: Davina Poke D.O.   On: 11/11/2022 12:42        Scheduled Meds:  azithromycin  500 mg Oral Daily   diltiazem  180 mg Oral Daily   enoxaparin (LOVENOX) injection  40 mg Subcutaneous Q24H   insulin aspart  0-9 Units Subcutaneous TID WC   ipratropium  0.5 mg Nebulization TID   levalbuterol  1.25 mg Nebulization TID   levothyroxine  88 mcg Oral Q0600   linagliptin  5 mg Oral Daily   metoprolol succinate  50 mg Oral Daily   pantoprazole  40 mg Oral Daily   potassium chloride  20 mEq Oral BID   rosuvastatin  20 mg Oral  Daily   Investigational - Study Medication  1 tablet Oral QPM   Investigational - Study Medication  1 tablet Oral q morning   Investigational - Study Medication  1 tablet Oral q morning   Continuous Infusions:   LOS: 0 days    Time spent: 35 minutes    Daunte Oestreich A Curtis Cain, MD [facility]   If 7PM-7AM, please contact night-coverage [email or web address]  11/13/2022, 10:39 AM

## 2022-11-13 NOTE — Evaluation (Signed)
Physical Therapy Evaluation Patient Details Name: [name] MRN: [id number] DOB: 06-03-1934 Today's Date: 11/13/2022  History of Present Illness  Pt is 88 year old presented to [facility] on  11/11/22 with SOB. Work up in progress to determine etiology. Pt developed afib with RVR in ED. PMH - CAD, pulmonary htn, afib, dm, htn, lung CA, stroke.  Clinical Impression  Pt presents to PT with decr in mobility due to weakness, decr balance, and decr activity tolerance. Expect pt will make steady progress back to baseline. Recommended to pt that she have someone stay with her at home the first 24 hours to make sure she was managing ok. Pt with dyspnea 2-3/4 with amb. Pt on RA throughout. At rest SpO2 90-96%. While amb unable to get accurate reading from SpO2 probe.        Recommendations for follow up therapy are one component of a multi-disciplinary discharge planning process, led by the attending physician.  Recommendations may be updated based on patient status, additional functional criteria and insurance authorization.  Follow Up Recommendations Home health PT      Assistance Recommended at Discharge Intermittent Supervision/Assistance  Patient can return home with the following  Help with stairs or ramp for entrance;Assistance with cooking/housework    Equipment Recommendations None recommended by PT  Recommendations for Other Services       Functional Status Assessment Patient has had a recent decline in their functional status and demonstrates the ability to make significant improvements in function in a reasonable and predictable amount of time.     Precautions / Restrictions Precautions Precautions: Fall;Other (comment) Precaution Comments: check SpO2 if able to get accurate reading with mobility      Mobility  Bed Mobility Overal bed mobility: Needs Assistance Bed Mobility: Supine to Sit     Supine to sit: Min guard, HOB elevated     General bed mobility comments:  Incr time/effort    Transfers Overall transfer level: Needs assistance Equipment used: Rolling walker (2 wheels) Transfers: Sit to/from Stand Sit to Stand: Min guard           General transfer comment: Assist for safety and lines    Ambulation/Gait Ambulation/Gait assistance: Min guard Gait Distance (Feet): 120 Feet Assistive device: Rolling walker (2 wheels) Gait Pattern/deviations: Step-through pattern, Decreased stride length, Trunk flexed Gait velocity: decr Gait velocity interpretation: 1.31 - 2.62 ft/sec, indicative of limited community ambulator   General Gait Details: Assist for safety and lines.  Stairs            Wheelchair Mobility    Modified Rankin (Stroke Patients Only)       Balance Overall balance assessment: Needs assistance Sitting-balance support: No upper extremity supported, Feet supported Sitting balance-Leahy Scale: Good     Standing balance support: Single extremity supported, Bilateral upper extremity supported, During functional activity Standing balance-Leahy Scale: Poor Standing balance comment: UE support                             Pertinent Vitals/Pain Pain Assessment Pain Assessment: No/denies pain    Home Living Family/patient expects to be discharged to:: Private residence Living Arrangements: Alone Available Help at Discharge: Family;Available PRN/intermittently Type of Home: House Home Access: Stairs to enter Entrance Stairs-Rails: Right;Left;Can reach both Entrance Stairs-Number of Steps: 8   Home Layout: Able to live on main level with bedroom/bathroom;Multi-level Home Equipment: Rolling Walker (2 wheels);Cane - single point;BSC/3in1;Shower seat;Grab bars - tub/shower  Prior Function Prior Level of Function : Independent/Modified Independent             Mobility Comments: Uses cane or walker or furniture walks. Stays in the house unless family there to assist       Hand Dominance    Dominant Hand: Left    Extremity/Trunk Assessment   Upper Extremity Assessment Upper Extremity Assessment: Defer to OT evaluation    Lower Extremity Assessment Lower Extremity Assessment: Generalized weakness       Communication   Communication: HOH  Cognition Arousal/Alertness: Awake/alert Behavior During Therapy: WFL for tasks assessed/performed Overall Cognitive Status: Within Functional Limits for tasks assessed                                          General Comments General comments (skin integrity, edema, etc.): HR to 130's with amb. SpO2 90-96% at rest on RA. Unable to get accurate SpO2 reading with amb. Dyspnea 2/4 with amb    Exercises     Assessment/Plan    PT Assessment Patient needs continued PT services  PT Problem List Decreased strength;Decreased activity tolerance;Decreased balance;Decreased mobility       PT Treatment Interventions DME instruction;Gait training;Stair training;Functional mobility training;Therapeutic activities;Therapeutic exercise;Balance training;Patient/family education    PT Goals (Current goals can be found in the Care Plan section)  Acute Rehab PT Goals Patient Stated Goal: return home and see her grandson PT Goal Formulation: With patient Time For Goal Achievement: 11/27/22 Potential to Achieve Goals: Good    Frequency Min 3X/week     Co-evaluation               AM-PAC PT "6 Clicks" Mobility  Outcome Measure Help needed turning from your back to your side while in a flat bed without using bedrails?: None Help needed moving from lying on your back to sitting on the side of a flat bed without using bedrails?: None Help needed moving to and from a bed to a chair (including a wheelchair)?: A Little Help needed standing up from a chair using your arms (e.g., wheelchair or bedside chair)?: A Little Help needed to walk in hospital room?: A Little Help needed climbing 3-5 steps with a railing? : A  Little 6 Click Score: 20    End of Session Equipment Utilized During Treatment: Gait belt Activity Tolerance: Patient tolerated treatment well Patient left: in chair;with call bell/phone within reach;with chair alarm set   PT Visit Diagnosis: Unsteadiness on feet (R26.81);Muscle weakness (generalized) (M62.81)    Time: 5366-4403 PT Time Calculation (min) (ACUTE ONLY): 30 min   Charges:   PT Evaluation $PT Eval Moderate Complexity: 1 Mod PT Treatments $Gait Training: 8-22 mins        [place] [facility] [place] 11/13/2022, 2:02 PM

## 2022-11-13 NOTE — Consult Note (Signed)
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  She is followed at [facility].  She has never smoked.  She was on oxygen until 6 years ago at which time she is quit due to the cost.  She has had intermittent dyspnea on exertion for over 2 years and presents with acute episodes and is admitted for further evaluation and treatment.  She had a pulmonary function test performed in 03/01/2022 which showed a FEV1 70%.  She denies fevers chills sweats or purulent sputum.  He does have an intermittent cough.  No reported lower extremity edema.  CT of the chest was negative for pulmonary embolism but did show bilateral pulm effusions.  Pulmonary critical care asked to evaluate for exertional dyspnea and for amiodarone toxicity although I cannot find a record of her being on amiodarone in the last several months.  Pertinent  Medical History   Past Medical History:  Diagnosis Date   Acute diverticulitis 06/2017   Anemia 06/2017   ARF (acute renal failure) ([place]) 06/2017   CAD (coronary artery disease) 03/13/2012   Diabetes mellitus    Dysrhythmia    ATRIAL FIB   Fall 07/07/2017   HTN (hypertension) 03/13/2012   Hypothyroid 03/13/2012   Lung cancer ([place])    Shortness of breath    Stroke ([place])    Syncope and collapse 04/02/2016   Type II or unspecified type diabetes mellitus without mention of complication, not stated as uncontrolled 03/13/2012     Significant Hospital Events: Including procedures, antibiotic start and stop dates in addition to other pertinent events     Interim History / Subjective:  Reported intermittent issues of shortness of breath  Objective   Blood pressure (!) 141/87, pulse 90, temperature 97.6 F (36.4 C),  temperature source Oral, resp. rate 11, height 5' 2.01" (1.575 m), weight 54.7 kg, SpO2 98 %.        Intake/Output Summary (Last 24 hours) at 11/13/2022 1151 Last data filed at 11/13/2022 1100 Gross per 24 hour  Intake 766.71 ml  Output 1750 ml  Net -983.29 ml   Filed Weights   11/13/22 0600  Weight: 54.7 kg    Examination: General: 88 year old female who extremely hard of hearing HENT: No JVD no lymph adenopathy is appreciated Lungs: Decreased breath sounds in the bases currently on room air with O2 saturations of 96% Cardiovascular: Heart sounds are irregular Abdomen: Abdomen soft nontender Extremities: Without edema Neuro: Grossly intact GU: voids  Resolved Hospital Problem list     Assessment & Plan:  Chronic  dyspnea on exertion noted to be going on for approximately 2 months of note she was on oxygen up to 6 years ago at which time she stopped because it was too expensive.  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 Suspected aspiration Extensive coronary artery disease Diuresis Follow-up in pulmonary clinic with Dr. Erin Fulling Consider resuming oxygen Walk test on room air to see later saturations dropped to Pulmonary function tests are not done in the hospital and she has a set of pulmonary function test done in March 2023 which showed FEV1 of 74% Consider swallow evaluation  Extensive coronary disease Per cardiology Diuresis  Chronic failure to thrive in  an 88 year old with multiple comorbidities She is a DNR  Best Practice (right click and "Reselect all SmartList Selections" daily)   Diet/type: Regular consistency (see orders) DVT prophylaxis: DOAC GI prophylaxis: PPI Lines: N/A Foley:  N/A Code Status:  DNR Last date of multidisciplinary goals of care discussion [tbd ]  Labs   CBC: Recent Labs  Lab 11/11/22 1158 11/12/22 2245 11/13/22 0348  WBC 6.4 4.9 4.4  NEUTROABS 4.1  --   --   HGB  10.8* 9.9* 10.0*  HCT 32.6* 29.4* 29.4*  MCV 88.8 87.8 89.4  PLT 118* 112* 110*    Basic Metabolic Panel: Recent Labs  Lab 11/11/22 1158 11/12/22 2245 11/13/22 0348  NA 138  --  134*  K 3.8  --  3.3*  CL 105  --  100  CO2 24  --  24  GLUCOSE 144*  --  203*  BUN 19  --  19  CREATININE 1.23* 1.37* 1.25*  CALCIUM 9.2  --  8.4*  MG  --   --  1.2*   GFR: Estimated Creatinine Clearance: 25.6 mL/min (A) (by C-G formula based on SCr of 1.25 mg/dL (H)). Recent Labs  Lab 11/11/22 1158 11/12/22 2245 11/13/22 0348  PROCALCITON  --  0.12  --   WBC 6.4 4.9 4.4    Liver Function Tests: No results for input(s): "AST", "ALT", "ALKPHOS", "BILITOT", "PROT", "ALBUMIN" in the last 168 hours. No results for input(s): "LIPASE", "AMYLASE" in the last 168 hours. No results for input(s): "AMMONIA" in the last 168 hours.  ABG    Component Value Date/Time   PHART 7.465 (H) 06/24/2016 1339   PCO2ART 34.8 (L) 06/24/2016 1339   PO2ART 79.0 (L) 06/24/2016 1339   HCO3 25.7 (H) 06/24/2016 1349   TCO2 23 12/12/2016 1549   O2SAT 59.0 06/24/2016 1349     Coagulation Profile: No results for input(s): "INR", "PROTIME" in the last 168 hours.  Cardiac Enzymes: No results for input(s): "CKTOTAL", "CKMB", "CKMBINDEX", "TROPONINI" in the last 168 hours.  HbA1C: Hgb A1c MFr Bld  Date/Time Value Ref Range Status  11/12/2022 10:45 PM 5.8 (H) 4.8 - 5.6 % Final    Comment:    (NOTE) Pre diabetes:          5.7%-6.4%  Diabetes:              >6.4%  Glycemic control for   <7.0% adults with diabetes   06/20/2019 09:35 AM 7.8 (H) 4.8 - 5.6 % Final    Comment:             Prediabetes: 5.7 - 6.4          Diabetes: >6.4          Glycemic control for adults with diabetes: <7.0     CBG: Recent Labs  Lab 11/12/22 1913 11/12/22 2250 11/13/22 0814  GLUCAP 136* 192* 140*    Review of Systems:   10 point review of system taken, please see HPI for positives and negatives.   Past Medical  History:  She,  has a past medical history of Acute diverticulitis (06/2017), Anemia (06/2017), ARF (acute renal failure) ([place]) (06/2017), CAD (coronary artery disease) (03/13/2012), Diabetes mellitus, Dysrhythmia, Fall (07/07/2017), HTN (hypertension) (03/13/2012), Hypothyroid (03/13/2012), Lung cancer ([place]), Shortness of breath, Stroke ([place]), Syncope and collapse (04/02/2016), and Type II or unspecified type diabetes mellitus without mention of complication, not stated as uncontrolled (03/13/2012).   Surgical History:   Past Surgical History:  Procedure Laterality Date  ABDOMINAL HYSTERECTOMY     BIOPSY  06/13/2021   Procedure: BIOPSY;  Surgeon: Ronnette Juniper, MD;  Location: [facility];  Service: Gastroenterology;;   BREAST EXCISIONAL BIOPSY Left    BREAST SURGERY     benign tumors removed in [place]  04/20/2012   Procedure: CARDIOVERSION;  Surgeon: Laverda Page, MD;  Location: [facility];  Service: Cardiovascular;  Laterality: N/A;   CARDIOVERSION N/A 05/10/2013   Procedure: CARDIOVERSION;  Surgeon: Laverda Page, MD;  Location: [facility];  Service: Cardiovascular;  Laterality: N/A;   CHOLECYSTECTOMY     COLONOSCOPY WITH PROPOFOL N/A 06/13/2021   Procedure: COLONOSCOPY WITH PROPOFOL;  Surgeon: Ronnette Juniper, MD;  Location: [facility];  Service: Gastroenterology;  Laterality: N/A;   ESOPHAGOGASTRODUODENOSCOPY (EGD) WITH PROPOFOL N/A 06/13/2021   Procedure: ESOPHAGOGASTRODUODENOSCOPY (EGD) WITH PROPOFOL;  Surgeon: Ronnette Juniper, MD;  Location: [facility];  Service: Gastroenterology;  Laterality: N/A;   HEMORRHOID SURGERY     jaw replacement     LEFT HEART CATHETERIZATION WITH CORONARY ANGIOGRAM N/A 04/20/2015   Procedure: LEFT HEART CATHETERIZATION WITH CORONARY ANGIOGRAM;  Surgeon: Adrian Prows, MD;  Location: [facility] CATH LAB;  Service: Cardiovascular;  Laterality: N/A;   tumors     back of head     Social History:   reports that she has never smoked.  She has never used smokeless tobacco. She reports that she does not drink alcohol and does not use drugs.   Family History:  Her family history includes Aneurysm in her father; Breast cancer in her mother; Diabetes type II in her sister, sister, and sister; Diverticulitis in her sister; Heart disease in her brother and sister; Hypertension in her sister; Melanoma in her brother and brother; Stroke in her sister.   Allergies Allergies  Allergen Reactions   Codeine Nausea And Vomiting   Hydrocodone-Acetaminophen Nausea And Vomiting     Home Medications  Prior to Admission medications   Medication Sig Start Date End Date Taking? Authorizing Provider  acetaminophen (TYLENOL) 500 MG tablet Take 500-1,000 mg by mouth every 6 (six) hours as needed for moderate pain.   Yes [provider]  calcium carbonate (TUMS) 500 MG chewable tablet Chew 1 tablet by mouth daily.   Yes [provider]  Calcium Citrate-Vitamin D ([employer] CALCIUM CITRATE +D3 MINI PO) Take 1 capsule by mouth daily.   Yes [provider]  cyanocobalamin (VITAMIN B12) 1000 MCG tablet Take 3,000 mcg by mouth daily.   Yes [provider]  diltiazem (DILT-XR) 180 MG 24 hr capsule TAKE 1 CAPSULE(180 MG) BY MOUTH DAILY 10/21/22  Yes Adrian Prows, MD  Fluocinolone Acetonide 0.01 % OIL Place 5 drops in ear(s) daily. 07/25/22  Yes [provider]  insulin degludec (TRESIBA FLEXTOUCH) 100 UNIT/ML FlexTouch Pen Inject 8 Units into the skin at bedtime. 05/13/22  Yes [provider]  lactose free nutrition (BOOST) LIQD Take 237 mLs by mouth daily.   Yes [provider]  levothyroxine (SYNTHROID) 88 MCG tablet Take 88 mcg by mouth daily. 07/25/22  Yes [provider]  metoprolol succinate (TOPROL-XL) 50 MG 24 hr tablet Take 1 tablet (50 mg total) by mouth daily. 06/20/22  Yes Adrian Prows, MD  rosuvastatin (CRESTOR) 20 MG tablet Take 1 tablet (20 mg total) by mouth daily. 06/20/22 12/17/22  Yes Adrian Prows, MD  sitaGLIPtin (JANUVIA) 50 MG tablet Take 50 mg by mouth daily.   Yes [provider]  Tiotropium Bromide-Olodaterol (  STIOLTO RESPIMAT) 2.5-2.5 MCG/ACT AERS Inhale 2 puffs into the lungs daily. 08/26/22  Yes Freddi Starr, MD  Insulin Pen Needle (NOVOFINE PLUS PEN NEEDLE) 32G X 4 MM MISC ONE 04/10/22   [provider]  pantoprazole (PROTONIX) 40 MG tablet Take 1 tablet (40 mg total) by mouth daily. Patient not taking: Reported on 11/12/2022 08/26/22   Freddi Starr, MD     Critical care time: Ferol Luz Regena Delucchi ACNP Acute Care Nurse Practitioner [place] Please consult [place] 11/13/2022, 11:51 AM

## 2022-11-14 ENCOUNTER — Inpatient Hospital Stay (HOSPITAL_COMMUNITY): Payer: Medicare Other

## 2022-11-14 DIAGNOSIS — R0902 Hypoxemia: Secondary | ICD-10-CM | POA: Diagnosis not present

## 2022-11-14 LAB — CBC
HCT: 31 % — ABNORMAL LOW (ref 36.0–46.0)
Hemoglobin: 10.7 g/dL — ABNORMAL LOW (ref 12.0–15.0)
MCH: 30 pg (ref 26.0–34.0)
MCHC: 34.5 g/dL (ref 30.0–36.0)
MCV: 86.8 fL (ref 80.0–100.0)
Platelets: 125 10*3/uL — ABNORMAL LOW (ref 150–400)
RBC: 3.57 MIL/uL — ABNORMAL LOW (ref 3.87–5.11)
RDW: 13.5 % (ref 11.5–15.5)
WBC: 5.9 10*3/uL (ref 4.0–10.5)
nRBC: 0 % (ref 0.0–0.2)

## 2022-11-14 LAB — GLUCOSE, CAPILLARY
Glucose-Capillary: 144 mg/dL — ABNORMAL HIGH (ref 70–99)
Glucose-Capillary: 135 mg/dL — ABNORMAL HIGH (ref 70–99)
Glucose-Capillary: 137 mg/dL — ABNORMAL HIGH (ref 70–99)
Glucose-Capillary: 168 mg/dL — ABNORMAL HIGH (ref 70–99)

## 2022-11-14 LAB — BASIC METABOLIC PANEL
Anion gap: 10 (ref 5–15)
BUN: 22 mg/dL (ref 8–23)
CO2: 26 mmol/L (ref 22–32)
Calcium: 8.8 mg/dL — ABNORMAL LOW (ref 8.9–10.3)
Chloride: 101 mmol/L (ref 98–111)
Creatinine, Ser: 1.85 mg/dL — ABNORMAL HIGH (ref 0.44–1.00)
GFR, Estimated: 26 mL/min — ABNORMAL LOW (ref >60)
Glucose, Bld: 143 mg/dL — ABNORMAL HIGH (ref 70–99)
Potassium: 3.9 mmol/L (ref 3.5–5.1)
Sodium: 137 mmol/L (ref 135–145)

## 2022-11-14 LAB — MAGNESIUM: Magnesium: 1.7 mg/dL (ref 1.7–2.4)

## 2022-11-14 MED ORDER — PANTOPRAZOLE SODIUM 40 MG IV SOLR
40.0000 mg | Freq: Two times a day (BID) | INTRAVENOUS | Status: DC
  Administered 2022-11-14 – 2022-11-19 (×11): 40 mg via INTRAVENOUS
  Filled 2022-11-14 (×10): qty 10

## 2022-11-14 MED ORDER — ENOXAPARIN SODIUM 30 MG/0.3ML IJ SOSY
30.0000 mg | PREFILLED_SYRINGE | INTRAMUSCULAR | Status: DC
  Administered 2022-11-14 – 2022-11-16 (×3): 30 mg via SUBCUTANEOUS
  Filled 2022-11-14 (×3): qty 0.3

## 2022-11-14 MED ORDER — RINGERS IV SOLN: INTRAVENOUS | Status: DC

## 2022-11-14 MED ORDER — LACTATED RINGERS IV SOLN: INTRAVENOUS | Status: AC

## 2022-11-14 MED ORDER — MAGNESIUM SULFATE 2 GM/50ML IV SOLN
2.0000 g | Freq: Once | INTRAVENOUS | Status: AC
  Administered 2022-11-14: 2 g via INTRAVENOUS
  Filled 2022-11-14: qty 50

## 2022-11-14 NOTE — Progress Notes (Signed)
PROGRESS NOTE    NITZA SCHMID  VOZ:[id number] DOB: 1934-07-30 DOA: 11/11/2022 PCP: [name], FNP   Brief Narrative: 88 year old past medical history significant for CAD, pulmonary hypertension, right subclavian artery stenosis, permanent A-fib, diabetes type 2, hypertension, stage IV low-grade Carcinoid/neuroendocrine presents complaining of worsening shortness of breath on exertion over the last past several months, but worse over the last several days was advised to present to the ED by Dr. Einar Gip. [name] also reports some chocking episodes with meals. CTA negative for PE, moderate right and small left pleural effusion with associated atelectasis, and newly mildly enlarged mediastinal and right hilar lymph node reactive need CT follow-up.  Numerous bilateral solid pulmonary nodules unchanged from 2018.   Assessment & Plan:   Principal Problem:   Hypoxia Active Problems:   Carcinoid tumor of lung   Atrial fibrillation (HCC) CHA2DS2-VASc Score 7   HTN (hypertension)   DM2 (diabetes mellitus, type 2) (HCC)   CAD (coronary artery disease)   Anemia   1-Dyspnea on Exertion:  CTA chest with moderate right pleural effusion.  Received IV lasix times 3 doses.  Pulmonology consulted for evaluation of Amiodarone toxicity ? , as recommended by Dr Einar Gip.  Plan to check for  home oxygen needs.  Continue with nebulizer.  CCM consulted. Dyspnea thought to be multifactorial. pleural effusion, acute on chronic diastolic HF, Low grade lung neuroendocrine tumor.  Started on Pulmicort.   2-Acute Hypoxic resp failure.  Multifactorial, secondary to pleural effusion, acute on chronic diastolic HF, Low grade lung neuroendocrine tumor.  Started on Pulmicort nebulizer. Didn't received Pulmicort yesterday.  Hold IV lasix due to AKI.  Needs evaluation for home oxygen.    3-Acute on Chronic Diastolic HF  Presents with dyspnea, pleural effusion, increase BNP.  Hold lasix due to AKI.    AKI; in setting diuretics. Had diarrhea.  Vomiting this am.  Will hold lasix. Will give IV fluids or 4 hours.   Nausea, vomiting:  KUB; marked gastric distension. Assessment for Small Bowel obstruction is limited  Will proceed with CT abdomen pelvis.   H/O CAD; S/P PCI Denies chest pain.   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  On Stiolto-- change to Pulmicort by pulmonologist   GERD; PPI change to BID  Dysphagia; evaluated by speech, continue with regular diet.  Hypomagnesemia; Replete IV Hypokalemia; Replaced.   A fib; Continue with Cardizem and metoprolol./   Estimated body mass index is 22.05 kg/m as calculated from the following:   Height as of this encounter: 5' 2.01" (1.575 m).   Weight as of this encounter: 54.7 kg.   DVT prophylaxis: Lovenox Code Status: DNR Family Communication: Son over Phone; 11/17 Disposition Plan:  Status is: Inpatient Remains inpatient appropriate because: management of Resp failure    Consultants:  CCM Dr Einar Gip  Procedures:  none  Antimicrobials:    Subjective: She had diarrhea last night. She vomited this morning, she denies abdominal pain.  Report nausea.   Objective: Vitals:   11/14/22 0033 11/14/22 0400 11/14/22 0739 11/14/22 0800  BP: 100/60 (!) 100/58 119/62   Pulse: 100 91 72   Resp: 18 20 20    Temp: 97.9 F (36.6 C) 97.8 F (36.6 C) (!) 97.3 F (36.3 C)   TempSrc: Oral Oral Axillary   SpO2: 93% 90% (!) 89% 92%  Weight:      Height:        Intake/Output Summary (Last 24 hours) at 11/14/2022 4259 Last data filed  at 11/14/2022 0549 Gross per 24 hour  Intake 1080 ml  Output 1400 ml  Net -320 ml    Filed Weights   11/13/22 0600  Weight: 54.7 kg    Examination:  General exam: NAD Respiratory system: CTA Cardiovascular system: S 1, S 2  RRR Gastrointestinal system: BS present, soft, nt Central nervous system: alert and oriented Extremities: Symmetric 5 x 5 power.   Data Reviewed: I have  personally reviewed following labs and imaging studies  CBC: Recent Labs  Lab 11/11/22 1158 11/12/22 2245 11/13/22 0348 11/14/22 0458  WBC 6.4 4.9 4.4 5.9  NEUTROABS 4.1  --   --   --   HGB 10.8* 9.9* 10.0* 10.7*  HCT 32.6* 29.4* 29.4* 31.0*  MCV 88.8 87.8 89.4 86.8  PLT 118* 112* 110* 125*    Basic Metabolic Panel: Recent Labs  Lab 11/11/22 1158 11/12/22 2245 11/13/22 0348 11/14/22 0458  NA 138  --  134* 137  K 3.8  --  3.3* 3.9  CL 105  --  100 101  CO2 24  --  24 26  GLUCOSE 144*  --  203* 143*  BUN 19  --  19 22  CREATININE 1.23* 1.37* 1.25* 1.85*  CALCIUM 9.2  --  8.4* 8.8*  MG  --   --  1.2* 1.7    GFR: Estimated Creatinine Clearance: 17.3 mL/min (A) (by C-G formula based on SCr of 1.85 mg/dL (H)). Liver Function Tests: No results for input(s): "AST", "ALT", "ALKPHOS", "BILITOT", "PROT", "ALBUMIN" in the last 168 hours. No results for input(s): "LIPASE", "AMYLASE" in the last 168 hours. No results for input(s): "AMMONIA" in the last 168 hours. Coagulation Profile: No results for input(s): "INR", "PROTIME" in the last 168 hours. Cardiac Enzymes: No results for input(s): "CKTOTAL", "CKMB", "CKMBINDEX", "TROPONINI" in the last 168 hours. BNP (last 3 results) No results for input(s): "PROBNP" in the last 8760 hours. HbA1C: Recent Labs    11/12/22 2245  HGBA1C 5.8*    CBG: Recent Labs  Lab 11/13/22 0814 11/13/22 1157 11/13/22 1626 11/13/22 2220 11/14/22 0742  GLUCAP 140* 199* 178* 186* 144*    Lipid Profile: No results for input(s): "CHOL", "HDL", "LDLCALC", "TRIG", "CHOLHDL", "LDLDIRECT" in the last 72 hours. Thyroid Function Tests: No results for input(s): "TSH", "T4TOTAL", "FREET4", "T3FREE", "THYROIDAB" in the last 72 hours. Anemia Panel: No results for input(s): "VITAMINB12", "FOLATE", "FERRITIN", "TIBC", "IRON", "RETICCTPCT" in the last 72 hours. Sepsis Labs: Recent Labs  Lab 11/12/22 2245  PROCALCITON 0.12     Recent Results (from  the past 240 hour(s))  Resp Panel by RT-PCR (Flu A&B, Covid) Anterior Nasal Swab     Status: None   Collection Time: 11/12/22  1:44 PM   Specimen: Anterior Nasal Swab  Result Value Ref Range Status   SARS Coronavirus 2 by RT PCR NEGATIVE NEGATIVE Final    Comment: (NOTE) SARS-CoV-2 target nucleic acids are NOT DETECTED.  The SARS-CoV-2 RNA is generally detectable in upper respiratory specimens during the acute phase of infection. The lowest concentration of SARS-CoV-2 viral copies this assay can detect is 138 copies/mL. A negative result does not preclude SARS-Cov-2 infection and should not be used as the sole basis for treatment or other patient management decisions. A negative result may occur with  improper specimen collection/handling, submission of specimen other than nasopharyngeal swab, presence of viral mutation(s) within the areas targeted by this assay, and inadequate number of viral copies(<138 copies/mL). A negative result must be combined with  clinical observations, patient history, and epidemiological information. The expected result is Negative.  Fact Sheet for Patients:  EntrepreneurPulse.com.au  Fact Sheet for Healthcare Providers:  IncredibleEmployment.be  This test is no t yet approved or cleared by the [place] FDA and  has been authorized for detection and/or diagnosis of SARS-CoV-2 by FDA under an Emergency Use Authorization (EUA). This EUA will remain  in effect (meaning this test can be used) for the duration of the COVID-19 declaration under Section 564(b)(1) of the Act, 21 U.S.C.section 360bbb-3(b)(1), unless the authorization is terminated  or revoked sooner.       Influenza A by PCR NEGATIVE NEGATIVE Final   Influenza B by PCR NEGATIVE NEGATIVE Final    Comment: (NOTE) The Xpert Xpress SARS-CoV-2/FLU/RSV plus assay is intended as an aid in the diagnosis of influenza from Nasopharyngeal swab specimens  and should not be used as a sole basis for treatment. Nasal washings and aspirates are unacceptable for Xpert Xpress SARS-CoV-2/FLU/RSV testing.  Fact Sheet for Patients: EntrepreneurPulse.com.au  Fact Sheet for Healthcare Providers: IncredibleEmployment.be  This test is not yet approved or cleared by the [place] FDA and has been authorized for detection and/or diagnosis of SARS-CoV-2 by FDA under an Emergency Use Authorization (EUA). This EUA will remain in effect (meaning this test can be used) for the duration of the COVID-19 declaration under Section 564(b)(1) of the Act, 21 U.S.C. section 360bbb-3(b)(1), unless the authorization is terminated or revoked.  Performed at [facility], [place] [address], [place], [place] [address]          Radiology Studies: ECHOCARDIOGRAM COMPLETE  Result Date: 11/13/2022    ECHOCARDIOGRAM REPORT   Patient Name:   ASHIKA APUZZO Date of Exam: 11/12/2022 Medical Rec #:  [id number]        Height:       62.0 in Accession #:    [id number]       Weight:       120.6 lb Date of Birth:  08-26-1934        BSA:          1.542 m Patient Age:    80 years         BP:           130/70 mmHg Patient Gender: F                HR:           104 bpm. Exam Location:  Inpatient Procedure: 2D Echo, Cardiac Doppler and Color Doppler Indications:    Cardiomyopathy-Unspecified I42.9  History:        Patient has no prior history of Echocardiogram examinations and                 Patient has prior history of Echocardiogram examinations, most                 recent 07/09/2017. CAD, Stroke, Arrythmias:Atrial Fibrillation,                 Signs/Symptoms:Dyspnea, Shortness of Breath, Syncope and Chest                 Pain; Risk Factors:Hypertension and Diabetes.  Sonographer:    Ronny Flurry Sonographer#2:  Danne Baxter RDCS, FE, PE Referring Phys: [phone] ANKIT CHIRAG AMIN IMPRESSIONS  1. The aortic valve is calcified. There is  moderate calcification of the aortic valve. There is moderate thickening of the aortic valve. Aortic valve regurgitation is mild.  Moderate aortic valve stenosis. Aortic valve area, by VTI measures 1.37 cm. Aortic valve mean gradient measures 22.8 mmHg. Aortic valve Vmax measures 3.06 m/s.  2. Left ventricular ejection fraction, by estimation, is 70 to 75%. The left ventricle has hyperdynamic function. The left ventricle has no regional wall motion abnormalities. There is moderate left ventricular hypertrophy. Left ventricular diastolic parameters are indeterminate.  3. Right ventricular systolic function is normal. The right ventricular size is normal. There is moderately elevated pulmonary artery systolic pressure. The estimated right ventricular systolic pressure is 29.5 mmHg.  4. The mitral valve is degenerative. Mild mitral valve regurgitation. Mild mitral stenosis. The mean mitral valve gradient is 6.0 mmHg.  5. The inferior vena cava is normal in size with greater than 50% respiratory variability, suggesting right atrial pressure of 3 mmHg. FINDINGS  Left Ventricle: Left ventricular ejection fraction, by estimation, is 70 to 75%. The left ventricle has hyperdynamic function. The left ventricle has no regional wall motion abnormalities. The left ventricular internal cavity size was normal in size. There is moderate left ventricular hypertrophy. Left ventricular diastolic parameters are indeterminate. Right Ventricle: The right ventricular size is normal. No increase in right ventricular wall thickness. Right ventricular systolic function is normal. There is moderately elevated pulmonary artery systolic pressure. The tricuspid regurgitant velocity is 3.25 m/s, and with an assumed right atrial pressure of 3 mmHg, the estimated right ventricular systolic pressure is 28.4 mmHg. Left Atrium: Left atrial size was normal in size. Right Atrium: Right atrial size was normal in size. Pericardium: There is no evidence of  pericardial effusion. Mitral Valve: The mitral valve is degenerative in appearance. There is mild thickening of the mitral valve leaflet(s). There is mild calcification of the mitral valve leaflet(s). Mild mitral valve regurgitation. Mild mitral valve stenosis. MV peak gradient, 17.6 mmHg. The mean mitral valve gradient is 6.0 mmHg. Tricuspid Valve: The tricuspid valve is normal in structure. Tricuspid valve regurgitation is mild . No evidence of tricuspid stenosis. Aortic Valve: The aortic valve is calcified. There is moderate calcification of the aortic valve. There is moderate thickening of the aortic valve. Aortic valve regurgitation is mild. Moderate aortic stenosis is present. Aortic valve mean gradient measures 22.8 mmHg. Aortic valve peak gradient measures 37.4 mmHg. Aortic valve area, by VTI measures 1.37 cm. Pulmonic Valve: The pulmonic valve was normal in structure. Pulmonic valve regurgitation is trivial. No evidence of pulmonic stenosis. Aorta: The aortic root is normal in size and structure. Venous: The inferior vena cava is normal in size with greater than 50% respiratory variability, suggesting right atrial pressure of 3 mmHg. IAS/Shunts: No atrial level shunt detected by color flow Doppler.  LEFT VENTRICLE PLAX 2D LVIDd:         2.70 cm LVIDs:         1.70 cm LV PW:         1.20 cm LV IVS:        1.40 cm LVOT diam:     1.90 cm LV SV:         78 LV SV Index:   50 LVOT Area:     2.84 cm  RIGHT VENTRICLE             IVC RV S prime:     11.50 cm/s  IVC diam: 1.20 cm TAPSE (M-mode): 1.6 cm LEFT ATRIUM             Index        RIGHT ATRIUM  Index LA diam:        3.50 cm 2.27 cm/m   RA Area:     14.20 cm LA Vol (A2C):   31.9 ml 20.69 ml/m  RA Volume:   28.10 ml  18.22 ml/m LA Vol (A4C):   32.0 ml 20.75 ml/m LA Biplane Vol: 34.4 ml 22.31 ml/m  AORTIC VALVE AV Area (Vmax):    1.36 cm AV Area (Vmean):   1.39 cm AV Area (VTI):     1.37 cm AV Vmax:           305.80 cm/s AV Vmean:           226.000 cm/s AV VTI:            0.566 m AV Peak Grad:      37.4 mmHg AV Mean Grad:      22.8 mmHg LVOT Vmax:         147.00 cm/s LVOT Vmean:        110.933 cm/s LVOT VTI:          0.274 m LVOT/AV VTI ratio: 0.48  AORTA Ao Root diam: 2.90 cm Ao Asc diam:  3.00 cm MITRAL VALVE              TRICUSPID VALVE MV Area VTI:  2.03 cm    TR Peak grad:   42.2 mmHg MV Peak grad: 17.6 mmHg   TR Vmax:        325.00 cm/s MV Mean grad: 6.0 mmHg MV Vmax:      2.09 m/s    SHUNTS MV Vmean:     102.5 cm/s  Systemic VTI:  0.27 m                           Systemic Diam: 1.90 cm Candee Furbish MD Electronically signed by Candee Furbish MD Signature Date/Time: 11/13/2022/6:39:48 AM    Final    CT Angio Chest PE W and/or Wo Contrast  Result Date: 11/12/2022 CLINICAL DATA:  Pulmonary embolus suspected EXAM: CT ANGIOGRAPHY CHEST WITH CONTRAST TECHNIQUE: Multidetector CT imaging of the chest was performed using the standard protocol during bolus administration of intravenous contrast. Multiplanar CT image reconstructions and MIPs were obtained to evaluate the vascular anatomy. RADIATION DOSE REDUCTION: This exam was performed according to the departmental dose-optimization program which includes automated exposure control, adjustment of the mA and/or kV according to patient size and/or use of iterative reconstruction technique. CONTRAST:  80mL OMNIPAQUE IOHEXOL 350 MG/ML SOLN COMPARISON:  Chest CT dated July 11th 2018 FINDINGS: Cardiovascular: No evidence of pulmonary embolus. Normal heart size. No pericardial effusion. Normal caliber thoracic aorta with severe calcified plaque. Severe coronary artery calcifications. Mediastinum/Nodes: Patulous esophagus. New mildly enlarged mediastinal and right hilar lymph nodes. Reference right lower paratracheal lymph node measuring 1.0 cm in short axis on series 5, image 38, previously 0.7 cm. Reference right hilar lymph node measuring 1.4 cm on series 5, image 46, previously 0.9 cm. Lungs/Pleura: Central  airways are patent. Mosaic attenuation. Scattered linear opacities are seen in the lower lungs which are likely due to scarring or atelectasis. Visualized solid pulmonary nodules are unchanged in size when compared with prior exam. Reference peribronchovascular nodule of the right middle lobe measuring 2 mm in mean diameter on series 7, image 56, unchanged. Reference left lower lobe nodule measuring 6 mm on series 7, image 33, unchanged. Moderate right and small left pleural effusions with associated atelectasis. Upper Abdomen: No acute  abnormality. Musculoskeletal: No chest wall abnormality. No acute or significant osseous findings. Review of the MIP images confirms the above findings. IMPRESSION: 1. No evidence of pulmonary embolus. 2. Moderate right and small left pleural effusions with associated atelectasis. 3. New mildly enlarged mediastinal and right hilar lymph nodes, likely reactive. Recommend follow-up chest CT in 3 months to ensure resolution. 4. 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 5. Mosaic attenuation of the lungs, likely due to small airways disease. 6. Severe coronary artery calcifications. 7. Aortic Atherosclerosis (ICD10-I70.0). Electronically Signed   By: Yetta Glassman M.D.   On: 11/12/2022 11:04        Scheduled Meds:  azithromycin  500 mg Oral Daily   budesonide (PULMICORT) nebulizer solution  0.25 mg Nebulization BID   diltiazem  180 mg Oral Daily   enoxaparin (LOVENOX) injection  40 mg Subcutaneous Q24H   insulin aspart  0-9 Units Subcutaneous TID WC   ipratropium  0.5 mg Nebulization TID   levalbuterol  1.25 mg Nebulization TID   levothyroxine  88 mcg Oral Q0600   linagliptin  5 mg Oral Daily   metoprolol succinate  50 mg Oral Daily   pantoprazole  40 mg Oral BID   rosuvastatin  20 mg Oral Daily   Investigational - Study Medication  1  tablet Oral QPM   Investigational - Study Medication  1 tablet Oral q morning   Investigational - Study Medication  1 tablet Oral q morning   Continuous Infusions:   LOS: 1 day    Time spent: 35 minutes    Lorrin Nawrot A Shizuo Biskup, MD [facility]   If 7PM-7AM, please contact night-coverage [email or web address]  11/14/2022, 8:56 AM

## 2022-11-14 NOTE — TOC Initial Note (Addendum)
Transition of Care [facility]) - Initial/Assessment Note    Patient Details  Name: [name] MRN: [id number] Date of Birth: 17-Mar-1934  [facility] CM/SW Contact:    Cyndi Bender, RN Phone Number: 11/14/2022, 2:56 PM  Clinical Narrative:                 Spoke to patient and son, Remo Lipps. Remo Lipps said his brother [name] plans to come from [place], [place] to stay with his mom. Patient offered choice for home health. Patient defers to [facility] to find highly rated agency.  Caryl Pina with [facility] accepted referral.  Patient has all needed DME at this time.   Left VM with son, [name] [phone]  Address, Phone number and PCP verified.  [place] to [place], son, who states he will come pick up patient at discharge.  Expected Discharge Plan: [place] Barriers to Discharge: Continued Medical Work up   Patient Goals and CMS Choice Patient states their goals for this hospitalization and ongoing recovery are:: return home CMS Medicare.gov Compare Post Acute Care list provided to:: Patient Choice offered to / list presented to : Patient  Expected Discharge Plan and Services Expected Discharge Plan: [place]   Discharge Planning Services: CM Consult Post Acute Care Choice: [place] arrangements for the past 2 months: [place]: PT [place]: [place] ([place]) Date [place]: 11/14/22 Time [place]: 1448 Representative spoke with at [place]: Caryl Pina  Prior Living Arrangements/Services Living arrangements for the past 2 months: [place] Lives with:: Self Patient language and need for interpreter reviewed:: Yes Do you feel safe going back to the place where you live?: Yes      Need for Family Participation in Patient Care: Yes (Comment) Care giver support system in place?: Yes (comment) Current home services: DME (walker, cane,  bsc, shower chair) Criminal Activity/Legal Involvement Pertinent to Current Situation/Hospitalization: No - Comment as needed  Activities of Daily Living      Permission Sought/Granted         Permission granted to share info w AGENCY: HH        Emotional Assessment       Orientation: : Oriented to Situation, Oriented to  Time, Oriented to Place, Oriented to Self Alcohol / Substance Use: Not Applicable Psych Involvement: No (comment)  Admission diagnosis:  Hypoxia [R09.02] Acute respiratory failure with hypoxemia ([place]) [J96.01] Patient Active Problem List   Diagnosis Date Noted   Hypoxia 11/12/2022   Chronic atrial fibrillation with rapid ventricular response ([place]) 08/16/2018   Chest pain 07/08/2017   ARF (acute renal failure) ([place]) 07/08/2017   Acute diverticulitis 07/08/2017   Head injury    Right hip pain 01/03/2017   Muscle strain 01/03/2017   Atrial fibrillation with rapid ventricular response ([place]) 12/12/2016   Atrial fibrillation with RVR ([place])    Diabetes mellitus with complication (HCC)    Faintness    Thrombocytopenia ([place]) 04/07/2016   Anemia 04/07/2016   SOB (shortness of breath) on exertion    Syncope and collapse    Syncope 04/02/2016   Physical deconditioning 04/20/2015   Dyspnea 04/17/2015   SOB (shortness of breath) 04/17/2015   Acute renal failure superimposed on stage 3 chronic kidney disease ([place]) 04/17/2015   Hypokalemia  04/17/2015   Atrial fibrillation (HCC) CHA2DS2-VASc Score 7 03/13/2012   HTN (hypertension) 03/13/2012   Hypothyroid 03/13/2012   DM2 (diabetes mellitus, type 2) ([place]) 03/13/2012   CAD (coronary artery disease) 03/13/2012   Carcinoid tumor of lung    Stroke [facility])    PCP:  [name], [place] [facility]:   [place], [place] [address] Phone: [phone] Fax: [place], [address] [place] [address] Phone: [phone] Fax: [phone]     Social Determinants of Health (SDOH) Interventions    Readmission Risk Interventions     No data to display

## 2022-11-14 NOTE — Progress Notes (Signed)
Physical Therapy Treatment Patient Details Name: [name] MRN: [id number] DOB: 1934-08-01 Today's Date: 11/14/2022   History of Present Illness Pt is 88 year old presented to [facility] on  11/11/22 with SOB. Work up in progress to determine etiology. Pt developed afib with RVR in ED. PMH - CAD, pulmonary htn, afib, dm, htn, lung CA, stroke.    PT Comments    Patient reports choking on breakfast upon PT arrival and immediately begins throwing up. Requires Min A to get to EOB and Mod A to stand from EOB with use of RW. Able to take a few steps to get ot chair with MIn A and use of RW. HR up to 144 bpm max with mobility and Sp02 >87% on RA during session. Pt with positive emesis throughout. Encouraged getting up to chair for all meals for better positioning when eating. Session limited due to above. Will continue to follow.    Recommendations for follow up therapy are one component of a multi-disciplinary discharge planning process, led by the attending physician.  Recommendations may be updated based on patient status, additional functional criteria and insurance authorization.  Follow Up Recommendations  Home health PT     Assistance Recommended at Discharge Intermittent Supervision/Assistance  Patient can return home with the following Help with stairs or ramp for entrance;Assistance with cooking/housework;A little help with walking and/or transfers   Equipment Recommendations  None recommended by PT    Recommendations for Other Services       Precautions / Restrictions Precautions Precautions: Fall;Other (comment) Precaution Comments: O2 sats Restrictions Weight Bearing Restrictions: No     Mobility  Bed Mobility Overal bed mobility: Needs Assistance Bed Mobility: Supine to Sit     Supine to sit: Min assist, HOB elevated     General bed mobility comments: ASsist with trunk to get to EOB, pt started vomiting upon PT arrival, reporting she was choking on breakfast in  bed.    Transfers Overall transfer level: Needs assistance Equipment used: Rolling walker (2 wheels) Transfers: Sit to/from Stand, Bed to chair/wheelchair/BSC Sit to Stand: Mod assist   Step pivot transfers: Min assist       General transfer comment: mod A to power to standing with cues for hand placement, flexed posture. Stood from [employer], able to take steps to get to chair with Min A of 1 and RW. Sp02 stayed >87% on RA, HR up to 144 bpm during emesis.    Ambulation/Gait Ambulation/Gait assistance: Min assist Gait Distance (Feet): 5 Feet Assistive device: Rolling walker (2 wheels)   Gait velocity: decr     General Gait Details: Able to take a few steps in the room with usr of RW in a flexed posture. Limited due to nausea, vomiting, feeling choked. RN/MD present.   Stairs             Wheelchair Mobility    Modified Rankin (Stroke Patients Only)       Balance Overall balance assessment: Needs assistance Sitting-balance support: Feet supported, No upper extremity supported Sitting balance-Leahy Scale: Good Sitting balance - Comments: pt leaning over EOB vomiting wihtout LOB.   Standing balance support: During functional activity, Reliant on assistive device for balance, Bilateral upper extremity supported Standing balance-Leahy Scale: Poor Standing balance comment: UE support and Min A                            Cognition Arousal/Alertness: Awake/alert Behavior During Therapy:  WFL for tasks assessed/performed Overall Cognitive Status: Within Functional Limits for tasks assessed                                          Exercises      General Comments General comments (skin integrity, edema, etc.): HR up to 144 bpm max with activity/vomiting. Sp02 stayed >87% on RA throughout. Pt vomiting throughout session. RN present.      Pertinent Vitals/Pain Pain Assessment Pain Assessment: Faces Faces Pain Scale: Hurts little  more Pain Location: abdomen Pain Descriptors / Indicators: Discomfort Pain Intervention(s): Monitored during session, Repositioned    Home Living                          Prior Function            PT Goals (current goals can now be found in the care plan section) Progress towards PT goals: Not progressing toward goals - comment (due to nausea, vomiting, getting "choked up")    Frequency    Min 3X/week      PT Plan Current plan remains appropriate    Co-evaluation              AM-PAC PT "6 Clicks" Mobility   Outcome Measure  Help needed turning from your back to your side while in a flat bed without using bedrails?: A Little Help needed moving from lying on your back to sitting on the side of a flat bed without using bedrails?: A Little Help needed moving to and from a bed to a chair (including a wheelchair)?: A Lot Help needed standing up from a chair using your arms (e.g., wheelchair or bedside chair)?: A Lot Help needed to walk in hospital room?: A Little Help needed climbing 3-5 steps with a railing? : A Little 6 Click Score: 16    End of Session Equipment Utilized During Treatment: Gait belt Activity Tolerance: Treatment limited secondary to medical complications (Comment) (vomiting) Patient left: in chair;with call bell/phone within reach;with nursing/sitter in room;Other (comment) (with MD present) Nurse Communication: Mobility status PT Visit Diagnosis: Unsteadiness on feet (R26.81);Muscle weakness (generalized) (M62.81)     Time: 1017-5102 PT Time Calculation (min) (ACUTE ONLY): 17 min  Charges:  $Therapeutic Activity: 8-22 mins                     Marisa Severin, PT, DPT Acute Rehabilitation Services Secure chat preferred Office [place] 11/14/2022, 11:12 AM

## 2022-11-14 NOTE — Consult Note (Signed)
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  She is followed at [facility].  She has never smoked.  She was on oxygen until 6 years ago at which time she is quit due to the cost.  She has had intermittent dyspnea on exertion for over 2 years and presents with acute episodes and is admitted for further evaluation and treatment.  She had a pulmonary function test performed in 03/01/2022 which showed a FEV1 70%.  She denies fevers chills sweats or purulent sputum.  He does have an intermittent cough.  No reported lower extremity edema.  CT of the chest was negative for pulmonary embolism but did show bilateral pulm effusions.  Pulmonary critical care asked to evaluate for exertional dyspnea and for amiodarone toxicity although I cannot find a record of her being on amiodarone in the last several months.  Pertinent  Medical History   Past Medical History:  Diagnosis Date   Acute diverticulitis 06/2017   Anemia 06/2017   ARF (acute renal failure) ([place]) 06/2017   CAD (coronary artery disease) 03/13/2012   Diabetes mellitus    Dysrhythmia    ATRIAL FIB   Fall 07/07/2017   HTN (hypertension) 03/13/2012   Hypothyroid 03/13/2012   Lung cancer ([place])    Shortness of breath    Stroke ([place])    Syncope and collapse 04/02/2016   Type II or unspecified type diabetes mellitus without mention of complication, not stated as uncontrolled 03/13/2012     Significant Hospital Events: Including procedures, antibiotic start and stop dates in addition to other pertinent events     Interim History / Subjective:  Reported intermittent issues of shortness of breath  Objective   Blood pressure 119/62, pulse 72, temperature (!) 97.3 F (36.3 C),  temperature source Axillary, resp. rate 20, height 5' 2.01" (1.575 m), weight 54.7 kg, SpO2 92 %.        Intake/Output Summary (Last 24 hours) at 11/14/2022 0818 Last data filed at 11/14/2022 0549 Gross per 24 hour  Intake 1080 ml  Output 1400 ml  Net -320 ml   Filed Weights   11/13/22 0600  Weight: 54.7 kg    Examination: General: In no acute distress HENT: No JVD Lungs: Decreased breath sounds throughout Cardiovascular: Heart sounds are irregular Abdomen: Abdomen soft nontender Extremities: Without edema Neuro: Grossly intact GU: voids  Resolved Hospital Problem list     Assessment & Plan:  Chronic  dyspnea on exertion noted to be going on for approximately 2 months of note she was on oxygen up to 6 years ago at which time she stopped because it was too expensive.  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 Suspected aspiration Placed on inhaled corticosteroids 11/13/2022 with no significant improvement in perceived dyspnea on exertion Agree with diuresis Consider resuming home oxygen Rest per primary     Extensive coronary disease Per cardiology Diuresis  Chronic failure to thrive in an 88 year old with multiple comorbidities She is a DNR  Best Practice (right click and "Reselect all SmartList Selections" daily)   Diet/type: Regular consistency (see orders) DVT prophylaxis: DOAC GI prophylaxis: PPI Lines: N/A Foley:  N/A Code Status:  DNR Last date of multidisciplinary goals of  care discussion [tbd ]  Labs   CBC: Recent Labs  Lab 11/11/22 1158 11/12/22 2245 11/13/22 0348 11/14/22 0458  WBC 6.4 4.9 4.4 5.9  NEUTROABS 4.1  --   --   --   HGB 10.8* 9.9* 10.0* 10.7*  HCT 32.6* 29.4* 29.4* 31.0*  MCV 88.8 87.8 89.4 86.8  PLT 118* 112* 110* 125*    Basic Metabolic Panel: Recent Labs  Lab 11/11/22 1158 11/12/22 2245 11/13/22 0348 11/14/22 0458  NA 138  --  134* 137  K 3.8  --  3.3*  3.9  CL 105  --  100 101  CO2 24  --  24 26  GLUCOSE 144*  --  203* 143*  BUN 19  --  19 22  CREATININE 1.23* 1.37* 1.25* 1.85*  CALCIUM 9.2  --  8.4* 8.8*  MG  --   --  1.2* 1.7   GFR: Estimated Creatinine Clearance: 17.3 mL/min (A) (by C-G formula based on SCr of 1.85 mg/dL (H)). Recent Labs  Lab 11/11/22 1158 11/12/22 2245 11/13/22 0348 11/14/22 0458  PROCALCITON  --  0.12  --   --   WBC 6.4 4.9 4.4 5.9    Liver Function Tests: No results for input(s): "AST", "ALT", "ALKPHOS", "BILITOT", "PROT", "ALBUMIN" in the last 168 hours. No results for input(s): "LIPASE", "AMYLASE" in the last 168 hours. No results for input(s): "AMMONIA" in the last 168 hours.  ABG    Component Value Date/Time   PHART 7.465 (H) 06/24/2016 1339   PCO2ART 34.8 (L) 06/24/2016 1339   PO2ART 79.0 (L) 06/24/2016 1339   HCO3 25.7 (H) 06/24/2016 1349   TCO2 23 12/12/2016 1549   O2SAT 59.0 06/24/2016 1349     Coagulation Profile: No results for input(s): "INR", "PROTIME" in the last 168 hours.  Cardiac Enzymes: No results for input(s): "CKTOTAL", "CKMB", "CKMBINDEX", "TROPONINI" in the last 168 hours.  HbA1C: Hgb A1c MFr Bld  Date/Time Value Ref Range Status  11/12/2022 10:45 PM 5.8 (H) 4.8 - 5.6 % Final    Comment:    (NOTE) Pre diabetes:          5.7%-6.4%  Diabetes:              >6.4%  Glycemic control for   <7.0% adults with diabetes   06/20/2019 09:35 AM 7.8 (H) 4.8 - 5.6 % Final    Comment:             Prediabetes: 5.7 - 6.4          Diabetes: >6.4          Glycemic control for adults with diabetes: <7.0     CBG: Recent Labs  Lab 11/13/22 0814 11/13/22 1157 11/13/22 1626 11/13/22 2220 11/14/22 0742  GLUCAP 140* 199* 178* 186* 144*      Critical care time: Ferol Luz Valera Vallas ACNP Acute Care Nurse Practitioner [place] Please consult Amion 11/14/2022, 8:18 AM

## 2022-11-15 ENCOUNTER — Encounter (HOSPITAL_COMMUNITY): Payer: Self-pay | Admitting: Internal Medicine

## 2022-11-15 DIAGNOSIS — R0902 Hypoxemia: Secondary | ICD-10-CM | POA: Diagnosis not present

## 2022-11-15 LAB — GLUCOSE, CAPILLARY
Glucose-Capillary: 163 mg/dL — ABNORMAL HIGH (ref 70–99)
Glucose-Capillary: 202 mg/dL — ABNORMAL HIGH (ref 70–99)
Glucose-Capillary: 122 mg/dL — ABNORMAL HIGH (ref 70–99)
Glucose-Capillary: 163 mg/dL — ABNORMAL HIGH (ref 70–99)

## 2022-11-15 LAB — BASIC METABOLIC PANEL
Anion gap: 12 (ref 5–15)
BUN: 23 mg/dL (ref 8–23)
CO2: 23 mmol/L (ref 22–32)
Calcium: 8.8 mg/dL — ABNORMAL LOW (ref 8.9–10.3)
Chloride: 100 mmol/L (ref 98–111)
Creatinine, Ser: 1.7 mg/dL — ABNORMAL HIGH (ref 0.44–1.00)
GFR, Estimated: 29 mL/min — ABNORMAL LOW (ref >60)
Glucose, Bld: 159 mg/dL — ABNORMAL HIGH (ref 70–99)
Potassium: 4.2 mmol/L (ref 3.5–5.1)
Sodium: 135 mmol/L (ref 135–145)

## 2022-11-15 LAB — CBC
HCT: 31 % — ABNORMAL LOW (ref 36.0–46.0)
Hemoglobin: 10.5 g/dL — ABNORMAL LOW (ref 12.0–15.0)
MCH: 29.7 pg (ref 26.0–34.0)
MCHC: 33.9 g/dL (ref 30.0–36.0)
MCV: 87.6 fL (ref 80.0–100.0)
Platelets: 112 10*3/uL — ABNORMAL LOW (ref 150–400)
RBC: 3.54 MIL/uL — ABNORMAL LOW (ref 3.87–5.11)
RDW: 13.7 % (ref 11.5–15.5)
WBC: 5.6 10*3/uL (ref 4.0–10.5)
nRBC: 0 % (ref 0.0–0.2)

## 2022-11-15 MED ORDER — IPRATROPIUM BROMIDE 0.02 % IN SOLN
0.5000 mg | Freq: Two times a day (BID) | RESPIRATORY_TRACT | Status: DC
  Administered 2022-11-15 – 2022-11-24 (×15): 0.5 mg via RESPIRATORY_TRACT
  Filled 2022-11-15 (×19): qty 2.5

## 2022-11-15 MED ORDER — LEVALBUTEROL HCL 1.25 MG/0.5ML IN NEBU
1.2500 mg | INHALATION_SOLUTION | Freq: Two times a day (BID) | RESPIRATORY_TRACT | Status: DC
  Administered 2022-11-15 – 2022-11-20 (×9): 1.25 mg via RESPIRATORY_TRACT
  Filled 2022-11-15 (×10): qty 0.5

## 2022-11-15 MED ORDER — BUDESONIDE 0.5 MG/2ML IN SUSP
0.5000 mg | Freq: Two times a day (BID) | RESPIRATORY_TRACT | Status: DC
  Administered 2022-11-15 – 2022-11-26 (×19): 0.5 mg via RESPIRATORY_TRACT
  Filled 2022-11-15 (×22): qty 2

## 2022-11-15 NOTE — Consult Note (Incomplete)
Consultation  Referring Provider: No ref. provider found Primary Care Physician:  [name], [place] Primary Gastroenterologist:  Dr.  Luiz Iron for Consultation:  ***  HPI: [name] is a 88 y.o. female    Past Medical History:  Diagnosis Date   Acute diverticulitis 06/2017   Anemia 06/2017   CAD (coronary artery disease) 03/13/2012   Diabetes mellitus    Dysrhythmia    ATRIAL FIB   Fall 07/07/2017   HTN (hypertension) 03/13/2012   Hypothyroid 03/13/2012   Lung cancer ([place])    Shortness of breath    Stroke ([place])    Syncope and collapse 04/02/2016   Type II or unspecified type diabetes mellitus without mention of complication, not stated as uncontrolled 03/13/2012    Past Surgical History:  Procedure Laterality Date   ABDOMINAL HYSTERECTOMY     BIOPSY  06/13/2021   Procedure: BIOPSY;  Surgeon: Ronnette Juniper, MD;  Location: [facility];  Service: Gastroenterology;;   BREAST EXCISIONAL BIOPSY Left    BREAST SURGERY     benign tumors removed in [place]  04/20/2012   Procedure: CARDIOVERSION;  Surgeon: Laverda Page, MD;  Location: [place];  Service: Cardiovascular;  Laterality: N/A;   CARDIOVERSION N/A 05/10/2013   Procedure: CARDIOVERSION;  Surgeon: Laverda Page, MD;  Location: [place];  Service: Cardiovascular;  Laterality: N/A;   CHOLECYSTECTOMY     COLONOSCOPY WITH PROPOFOL N/A 06/13/2021   Procedure: COLONOSCOPY WITH PROPOFOL;  Surgeon: Ronnette Juniper, MD;  Location: [facility];  Service: Gastroenterology;  Laterality: N/A;   ESOPHAGOGASTRODUODENOSCOPY (EGD) WITH PROPOFOL N/A 06/13/2021   Procedure: ESOPHAGOGASTRODUODENOSCOPY (EGD) WITH PROPOFOL;  Surgeon: Ronnette Juniper, MD;  Location: [facility];  Service: Gastroenterology;  Laterality: N/A;   HEMORRHOID SURGERY     jaw replacement     LEFT HEART CATHETERIZATION WITH CORONARY ANGIOGRAM N/A 04/20/2015   Procedure: LEFT HEART CATHETERIZATION WITH CORONARY  ANGIOGRAM;  Surgeon: Adrian Prows, MD;  Location[facility] C[facility] LAB;  Service: Cardiovascular;  Laterality: N/A;   tumors     back of head    Prior to Admission medications   Medication Sig Start Date End Date Taking? Authorizing Provider  acetaminophen (TYLENOL) 500 MG tablet Take 500-1,000 mg by mouth every 6 (six) hours as needed for moderate pain.   Yes [provider]  calcium carbonate (TUMS) 500 MG chewable tablet Chew 1 tablet by mouth daily.   Yes [provider]  Calcium Citrate-Vitamin D ([employer] CALCIUM CITRATE +D3 MINI PO) Take 1 capsule by mouth daily.   Yes [provider]  cyanocobalamin (VITAMIN B12) 1000 MCG tablet Take 3,000 mcg by mouth daily.   Yes [provider]  diltiazem (DILT-XR) 180 MG 24 hr capsule TAKE 1 CAPSULE(180 MG) BY MOUTH DAILY 10/21/22  Yes Adrian Prows, MD  Fluocinolone Acetonide 0.01 % OIL Place 5 drops in ear(s) daily. 07/25/22  Yes [provider]  insulin degludec (TRESIBA FLEXTOUCH) 100 UNIT/ML FlexTouch Pen Inject 8 Units into the skin at bedtime. 05/13/22  Yes [provider]  lactose free nutrition (BOOST) LIQD Take 237 mLs by mouth daily.   Yes [provider]  levothyroxine (SYNTHROID) 88 MCG tablet Take 88 mcg by mouth daily. 07/25/22  Yes [provider]  metoprolol succinate (TOPROL-XL) 50 MG 24 hr tablet Take 1 tablet (50 mg total) by mouth daily. 06/20/22  Yes Adrian Prows, MD  rosuvastatin (CRESTOR) 20 MG tablet Take 1 tablet (  20 mg total) by mouth daily. 06/20/22 12/17/22 Yes Adrian Prows, MD  sitaGLIPtin (JANUVIA) 50 MG tablet Take 50 mg by mouth daily.   Yes [provider]  Tiotropium Bromide-Olodaterol (STIOLTO RESPIMAT) 2.5-2.5 MCG/ACT AERS Inhale 2 puffs into the lungs daily. 08/26/22  Yes Freddi Starr, MD  Insulin Pen Needle (NOVOFINE PLUS PEN NEEDLE) 32G X 4 MM MISC ONE 04/10/22   [provider]  pantoprazole (PROTONIX) 40 MG tablet Take 1 tablet (40 mg total) by  mouth daily. Patient not taking: Reported on 11/12/2022 08/26/22   Freddi Starr, MD    Current Facility-Administered Medications  Medication Dose Route Frequency Provider Last Rate Last Admin   acetaminophen (TYLENOL) tablet 650 mg  650 mg Oral Q6H PRN Amin, Ankit Chirag, MD   650 mg at 11/14/22 0505   budesonide (PULMICORT) nebulizer solution 0.5 mg  0.5 mg Nebulization BID Noe Gens L, NP       diltiazem (CARDIZEM CD) 24 hr capsule 180 mg  180 mg Oral Daily Amin, Ankit Chirag, MD   180 mg at 11/15/22 1014   enoxaparin (LOVENOX) injection 30 mg  30 mg Subcutaneous Q24H Pham, Minh Q, RPH-CPP   30 mg at 11/14/22 2256   guaiFENesin (ROBITUSSIN) 100 MG/5ML liquid 5 mL  5 mL Oral Q4H PRN Amin, Ankit Chirag, MD       hydrALAZINE (APRESOLINE) injection 10 mg  10 mg Intravenous Q4H PRN Amin, Ankit Chirag, MD       insulin aspart (novoLOG) injection 0-9 Units  0-9 Units Subcutaneous TID WC Amin, Ankit Chirag, MD   3 Units at 11/15/22 1231   ipratropium (ATROVENT) nebulizer solution 0.5 mg  0.5 mg Nebulization BID Regalado, Belkys A, MD       levalbuterol (XOPENEX) nebulizer solution 1.25 mg  1.25 mg Nebulization BID Regalado, Belkys A, MD       levothyroxine (SYNTHROID) tablet 88 mcg  88 mcg Oral Q0600 Amin, Ankit Chirag, MD   88 mcg at 11/15/22 0518   metoprolol succinate (TOPROL-XL) 24 hr tablet 50 mg  50 mg Oral Daily Amin, Ankit Chirag, MD   50 mg at 11/15/22 1014   metoprolol tartrate (LOPRESSOR) injection 5 mg  5 mg Intravenous Q4H PRN Amin, Ankit Chirag, MD   5 mg at 11/14/22 1848   ondansetron (ZOFRAN) injection 4 mg  4 mg Intravenous Q6H PRN Amin, Ankit Chirag, MD   4 mg at 11/15/22 1231   pantoprazole (PROTONIX) injection 40 mg  40 mg Intravenous Q12H Regalado, Belkys A, MD   40 mg at 11/15/22 1015   rosuvastatin (CRESTOR) tablet 20 mg  20 mg Oral Daily Amin, Ankit Chirag, MD   20 mg at 11/15/22 1014   senna-docusate (Senokot-S) tablet 1 tablet  1 tablet Oral QHS PRN Amin, Ankit  Chirag, MD       STUDY - [facility] - apixaban 2.5 mg, or apixaban 5 mg, or placebo tablet (Evening Dose) (PI: [place])  1 tablet Oral QPM Adrian Prows, MD   1 tablet at 11/14/22 1744   STUDY - [facility] - apixaban 2.5 mg, or apixaban 5 mg, or placebo tablet (Morning Dose) (PI: Ganji)  1 tablet Oral q morning Adrian Prows, MD   1 tablet at 11/15/22 1014   STUDY - [facility] - BAY [id number] Cecille Rubin) 50 mg or placebo tablet (PI: [place]ji)  1 tablet Oral q morning Adrian Prows, MD   1 tablet at 11/15/22 1015   traZODone (DESYREL) tablet 50 mg  50  mg Oral QHS PRN Damita Lack, MD        Allergies as of 11/11/2022 - Review Complete 11/11/2022  Allergen Reaction Noted   Codeine Nausea And Vomiting 10/02/2012   Hydrocodone-acetaminophen Nausea And Vomiting 07/14/2013    Family History  Problem Relation Age of Onset   Breast cancer Mother    Aneurysm Father        Likely thoracic aneurysm per patient   Hypertension Sister    Heart disease Sister    Diabetes type II Sister    Diabetes type II Sister    Diverticulitis Sister    Diabetes type II Sister    Stroke Sister    Heart disease Brother    Melanoma Brother    Melanoma Brother     Social History   Socioeconomic History   Marital status: Widowed    Spouse name: Not on file   Number of children: 3   Years of education: Not on file   Highest education level: Not on file  Occupational History   Not on file  Tobacco Use   Smoking status: Never   Smokeless tobacco: Never  Vaping Use   Vaping Use: Never used  Substance and Sexual Activity   Alcohol use: No   Drug use: No   Sexual activity: Yes    Birth control/protection: None  Other Topics Concern   Not on file  Social History Narrative   Not on file   Social Determinants of Health   Financial Resource Strain: Not on file  Food Insecurity: Not on file  Transportation Needs: Not on file  Physical Activity: Not on file  Stress: Not on file  Social Connections: Not on  file  Intimate Partner Violence: Not on file    Review of Systems: Pertinent positive and negative review of systems were noted in the above HPI section.  All other review of systems was otherwise negative.   Physical Exam: Vital signs in last 24 hours: Temp:  [97.9 F (36.6 C)-98.5 F (36.9 C)] 97.9 F (36.6 C) (11/18 1200) Pulse Rate:  [72-115] 99 (11/18 1200) Resp:  [18-24] 20 (11/18 1200) BP: (98-104)/(53-80) 104/61 (11/18 1200) SpO2:  [90 %-95 %] 90 % (11/18 1020) Last BM Date : 11/15/22 General:   Alert,  Well-developed, well-nourished, pleasant and cooperative in NAD Head:  Normocephalic and atraumatic. Eyes:  Sclera clear, no icterus.   Conjunctiva pink. Ears:  Normal auditory acuity. Nose:  No deformity, discharge,  or lesions. Mouth:  No deformity or lesions.   Neck:  Supple; no masses or thyromegaly. Lungs:  Clear throughout to auscultation.   No wheezes, crackles, or rhonchi. Heart:  Regular rate and rhythm; no murmurs, clicks, rubs,  or gallops. Abdomen:  Soft,nontender, BS active,nonpalp mass or hsm.   Rectal:  Deferred  Msk:  Symmetrical without gross deformities. . Pulses:  Normal pulses noted. Extremities:  Without clubbing or edema. Neurologic:  Alert and  oriented x4;  grossly normal neurologically. Skin:  Intact without significant lesions or rashes.. Psych:  Alert and cooperative. Normal mood and affect.  Intake/Output from previous day: 11/17 0701 - 11/18 0700 In: 50 [IV Piggyback:50] Out: 50 [Emesis/NG output:50] Intake/Output this shift: Total I/O In: 600 [P.O.:600] Out: -   Lab Results: Recent Labs    11/13/22 0348 11/14/22 0458 11/15/22 0222  WBC 4.4 5.9 5.6  HGB 10.0* 10.7* 10.5*  HCT 29.4* 31.0* 31.0*  PLT 110* 125* 112*   BMET Recent Labs    11/13/22  4193 11/14/22 0458 11/15/22 0222  NA 134* 137 135  K 3.3* 3.9 4.2  CL 100 101 100  CO2 24 26 23   GLUCOSE 203* 143* 159*  BUN 19 22 23   CREATININE 1.25* 1.85* 1.70*  CALCIUM  8.4* 8.8* 8.8*   LFT No results for input(s): "PROT", "ALBUMIN", "AST", "ALT", "ALKPHOS", "BILITOT", "BILIDIR", "IBILI" in the last 72 hours. PT/INR No results for input(s): "LABPROT", "INR" in the last 72 hours. Hepatitis Panel No results for input(s): "HEPBSAG", "HCVAB", "HEPAIGM", "HEPBIGM" in the last 72 hours.   IMPRESSION:  ***  PLAN: ***   Yenifer Saccente  PA-C11/18/2023, 2:49 PM

## 2022-11-15 NOTE — Progress Notes (Signed)
Speech Language Pathology Treatment: Dysphagia  Patient Details Name: [name] MRN: [id number] DOB: 27-Sep-1934 Today's Date: 11/15/2022 Time: 5885-0277 SLP Time Calculation (min) (ACUTE ONLY): 29 min  Assessment / Plan / Recommendation Clinical Impression  [name] was alert and participatory.  She had just finished her breakfast and reported no difficulties this am. She reiterated that she tends to "choke" occasionally when eating and relates it to solid food. She sleeps in a recliner due to waking up at night and "choking on saliva." She was observed to drink thin liquids and self-feed soft solids with no s/s of an oropharyngeal dysphagia. Her symptoms point to esophageal issues and potential reflux more than oropharyngeal dysfunction.  We talked about avoiding eating/drinking two hours before bedtime and continuing to elevate HOB at night. Pt reports she is already taking these actions- recommend further esophageal w/u if warranted.  No further SLP f/u is needed.     HPI HPI: Pt is a 88 y.o. female who presented to ED with complaints of shortness of breath. Pt and son tells me that off-and-on she has had issues with some choking and coughing along with exertional dyspnea even if she walks a few steps going anywhere.  CXR (11/14) showed streaky opacity and small bilateral pleural effusion. Upper GI endoscopy (05/2021) revealed normal esophagus. PMH: CAD, pulmonary hypertension, right subclavian artery stenosis, permanent atrial fibrillation, DM 2, HTN, stage IV low grade lung carcinoid/neuroendocrine.      SLP Plan    No SLP f/u     Recommendations for follow up therapy are one component of a multi-disciplinary discharge planning process, led by the attending physician.  Recommendations may be updated based on patient status, additional functional criteria and insurance authorization.    Recommendations  Diet recommendations: Thin liquid;Regular Liquids provided via:  Cup;Straw Medication Administration: Whole meds with liquid Supervision: Patient able to self feed Compensations: Minimize environmental distractions;Slow rate;Small sips/bites Postural Changes and/or Swallow Maneuvers: Upright 30-60 min after meal                Oral Care Recommendations: Oral care BID SLP Visit Diagnosis: Dysphagia, unspecified (R13.10)         [name] L. Tivis Ringer, MA CCC/SLP Clinical Specialist - Acute Care SLP Acute Rehabilitation Services Office number [phone]  [name]  11/15/2022, 8:38 AM

## 2022-11-15 NOTE — Progress Notes (Signed)
Nurse requested Mobility Specialist to perform oxygen saturation test with pt which includes removing pt from oxygen both at rest and while ambulating.  Below are the results from that testing.    Patient Saturations on Room Air at Rest = spO2 95%  Patient Saturations on Room Air while Ambulating = sp02 90%.  At end of testing pt left in room on RA Liters of oxygen.  Reported results to nurse.    Andrey Campanile Mobility Specialist Please contact via SecureChat or  Rehab office at [phone]

## 2022-11-15 NOTE — Progress Notes (Addendum)
NAME:  [name], MRN:  [id number], DOB:  15-Jan-1934, LOS: 2 ADMISSION DATE:  11/11/2022, CONSULTATION DATE: 11/13/2022 REFERRING MD: [facility], CHIEF COMPLAINT: Exertional dyspnea for over 2 years  History of Present Illness:  88 year old female who presented to [facility] with reports of exertional dyspnea.    She has a longstanding hx of dyspnea dating back to at least 2013 with recalcitrant atrial fibrillation managed with low dose amiodarone.  Hx of passive smoke exposure, never smoker. She was diagnosed with endocrine lung carcinoid in 2004, followed by [facility], not on any therapy such as somatostatin analogs and never has been.  She was on oxygen until 6 years ago at which time she is quit due to the cost.  She has had intermittent dyspnea on exertion for over 2 years and presents with acute episodes and is admitted for further evaluation and treatment.  She had PFT's performed in 03/01/2022 which showed a FEV1 70%.  She denies fevers chills sweats or purulent sputum.  He does have an intermittent cough.  No reported lower extremity edema.  CT of the chest was negative for pulmonary embolism but did show bilateral pulm effusions.  Pulmonary critical care asked to evaluate for exertional dyspnea and for amiodarone toxicity although no record of her being on amiodarone in the last several months.  Pertinent  Medical History  HTN  Atrial Fibrillation  CVA  CAD  Syncope & Collapse  Hypothyroidism  Chronic Dyspnea  Lung Cancer - endocrine lung carcinoid, dx [place] [address] by biopsy at [facility]  Anemia  DM II  [place]  Significant Hospital Events: Including procedures, antibiotic start and stop dates in addition to other pertinent events   11/14 Admit with SOB 11/16 PCCM consulted for evaluation of amiodarone lung toxicity, dyspnea. Diuresed with 40 mg lasix 11/17 Bump in Sr Cr. CT ABD/Pelvis > no bowel obstruction, fluid filled small and large bowel, colonic diverticulosis without diverticulitis,  moderate right and small left pleural effusions unchanged  Interim History / Subjective:  Pt reports she just ate lunch and now she is vomiting Afebrile  On room air, sats stable   Objective   Blood pressure 98/71, pulse (!) 109, temperature 98.3 F (36.8 C), temperature source Oral, resp. rate 20, height 5' 2.01" (1.575 m), weight 54.7 kg, SpO2 90 %.        Intake/Output Summary (Last 24 hours) at 11/15/2022 1207 Last data filed at 11/15/2022 1100 Gross per 24 hour  Intake 480 ml  Output --  Net 480 ml   Filed Weights   11/13/22 0600  Weight: 54.7 kg    Exam: General: elderly adult female sitting up in chair in NAD  HEENT: MM pink/moist, anicteric, fair dentition  Neuro: AAOx4, speech clear, MAE, appropriate in conversation  CV: s1s2 irr irr, AF on monitor 100-110's, no m/r/g PULM: non-labored at rest, lungs bilaterally clear anterior, diminished right>L basilar  GI: soft, bsx4 active, vomiting on entry to room Extremities: warm/dry, no edema  Skin: no rashes or lesions. Thin dry skin  Resolved Hospital Problem list     Assessment & Plan:   Chronic Dyspnea on Exertion  Concern for Recurrent Aspiration  Neuroendocrine Lung Cancer Prior Oxygen Use Suspect dyspnea multifactorial in setting of AF, AonC HFpEF, moderate AS, Low grade neuroendocrine tumor.  Previously on O2, but quit due to cost. Followed at [facility] for neuroendocrine tumor.  Patient with witnessed episode of emesis after eating.  Concern for possible recurrent aspiration.  Although, imaging suggest volume  with pleural effusions & associated atelectasis,  She did not tolerate diuresis given bump in sr cr.   -continue inhaled corticosteroids, initiated 11/16. Feels breathing difficult to determine if better -follow up CXR am 11/19, may need to consider thoracentesis but thrombocytopenia could complicate procedure -hold diuresis for now given AKI -assess ambulatory O2 needs prior to discharge  -would recommend GI  evaluation, ? Strictures given patient description of food getting hung up, points to mid chest and notes tolerance to liquids/soft foods but more difficulty with solids -if no significant improvement in dyspnea by 11/20 on nebs, would stop  Recurrent Emesis after Eating  GERD CT ABD/pelvis results noted    -consider GI evaluation, defer to primary  -defer infectious work up to primary   CAD  -per Cardiology    Best Practice (right click and "Reselect all SmartList Selections" daily)  Per primary / [place]  Code Status: DNR     Noe Gens, MSN, APRN, NP-C, AGACNP-BC [facility] 11/15/2022, 12:07 PM   Please see [email or web address] for pager details.   From 7A-7P if no response, please call [phone] After hours, please call ELink [phone]   Attending:    Subjective: Says breathing is a little better Vomiting, says this happens a lot  Objective: Vitals:   11/15/22 0700 11/15/22 0843 11/15/22 1020 11/15/22 1200  BP:  98/71  104/61  Pulse:  (!) 109  99  Resp:  20  20  Temp:  98.3 F (36.8 C)  97.9 F (36.6 C)  TempSrc:  Oral  Oral  SpO2: 95% 93% 90%   Weight:      Height:          Intake/Output Summary (Last 24 hours) at 11/15/2022 1338 Last data filed at 11/15/2022 1200 Gross per 24 hour  Intake 600 ml  Output --  Net 600 ml    General:  Chronically ill appearing, resting comfortably in chair HENT: NCAT OP clear PULM: diminished bases B, normal effort CV: irreg irreg, no mgr GI: BS+, soft, nontender MSK: normal bulk and tone Neuro: awake, alert, no distress, MAEW   CBC    Component Value Date/Time   WBC 5.6 11/15/2022 0222   RBC 3.54 (L) 11/15/2022 0222   HGB 10.5 (L) 11/15/2022 0222   HGB 11.7 09/17/2021 1316   HCT 31.0 (L) 11/15/2022 0222   HCT 36.7 09/17/2021 1316   PLT 112 (L) 11/15/2022 0222   PLT 128 (L) 09/17/2021 1316   MCV 87.6 11/15/2022 0222   MCV 86 09/17/2021 1316   MCH 29.7 11/15/2022 0222   MCHC 33.9  11/15/2022 0222   RDW 13.7 11/15/2022 0222   RDW 16.0 (H) 09/17/2021 1316   LYMPHSABS 1.3 11/11/2022 1158   MONOABS 0.8 11/11/2022 1158   EOSABS 0.0 11/11/2022 1158   BASOSABS 0.0 11/11/2022 1158    BMET    Component Value Date/Time   NA 135 11/15/2022 0222   NA 137 03/25/2021 1419   K 4.2 11/15/2022 0222   CL 100 11/15/2022 0222   CO2 23 11/15/2022 0222   GLUCOSE 159 (H) 11/15/2022 0222   BUN 23 11/15/2022 0222   BUN 29 (H) 03/25/2021 1419   CREATININE 1.70 (H) 11/15/2022 0222   CALCIUM 8.8 (L) 11/15/2022 0222   GFRNONAA 29 (L) 11/15/2022 0222   GFRAA 37 (L) 09/12/2019 0957      Impression Likely aspiration, recurrent Likely esophageal dysmotility/dysfunction Bilateral pleural effusions Doubt amiodarone lung toxicity  Plan: Repeat CXR 2 view May  need thoracentesis Continue pulmicort Recommend barium esophogram  My cc time n/a minutes  Roselie Awkward, MD [place] [facility] Pager: [phone] Cell: [phone] After 7pm: [phone]

## 2022-11-15 NOTE — Progress Notes (Signed)
Mobility Specialist Progress Note:   11/15/22 1020  Oxygen Therapy  SpO2 90 %  O2 Device Room Air  Mobility  Activity Ambulated with assistance in hallway  Level of Assistance Minimal assist, patient does 75% or more  Assistive Device Front wheel walker  Distance Ambulated (ft) 100 ft  Activity Response Tolerated well  Mobility Referral Yes  $Mobility charge 1 Mobility   Pre-mobility: 95% SpO2 During mobility: 90% SpO2 Post-mobility: 93% SpO2  Pt received in bed and agreeable. Performed oxygen saturation test per request of RN. No complaints throughout ambulation. Pt left in chair with all needs met, call bell in reach, and RN in room.   Andrey Campanile Mobility Specialist Please contact via SecureChat or  Rehab office at [phone]

## 2022-11-15 NOTE — Progress Notes (Signed)
PROGRESS NOTE    [name]  VVO:[id number] DOB: 1934-12-01 DOA: 11/11/2022 PCP: [name], FNP   Brief Narrative: 88 year old past medical history significant for CAD, pulmonary hypertension, right subclavian artery stenosis, permanent A-fib, diabetes type 2, hypertension, stage IV low-grade Carcinoid/neuroendocrine presents complaining of worsening shortness of breath on exertion over the last past several months, but worse over the last several days was advised to present to the ED by Dr. Einar Gip. [name] also reports some chocking episodes with meals. CTA negative for PE, moderate right and small left pleural effusion with associated atelectasis, and newly mildly enlarged mediastinal and right hilar lymph node reactive need CT follow-up.  Numerous bilateral solid pulmonary nodules unchanged from 2018.   Assessment & Plan:   Principal Problem:   Hypoxia Active Problems:   Carcinoid tumor of lung   Atrial fibrillation (HCC) CHA2DS2-VASc Score 7   HTN (hypertension)   DM2 (diabetes mellitus, type 2) (HCC)   CAD (coronary artery disease)   Anemia   1-Dyspnea on Exertion:  CTA chest with moderate right pleural effusion.  Received IV lasix times 3 doses.  Pulmonology consulted for evaluation of Amiodarone toxicity ? , as recommended by Dr Einar Gip.  Plan to check for  home oxygen needs.  Continue with nebulizer.  CCM consulted. Dyspnea thought to be multifactorial. pleural effusion, acute on chronic diastolic HF, Low grade lung neuroendocrine tumor.  Started on Pulmicort.  Plan to check Nocturnal Oxygen sat. Patient desats at Night.   2-Acute Hypoxic resp failure.  Multifactorial, secondary to pleural effusion, acute on chronic diastolic HF, Low grade lung neuroendocrine tumor.  Started on Pulmicort nebulizer.  Hold IV lasix due to AKI.  Needs evaluation for home oxygen.    3-Acute on Chronic Diastolic HF  Presents with dyspnea, pleural effusion, increase BNP.   Hold lasix due to AKI.   AKI; in setting diuretics. Had diarrhea.  Vomiting this am.  Will hold lasix. Received IV fluids.  Cr down to 1.7  Nausea, vomiting:  Dysphagia.  KUB; marked gastric distension. Assessment for Small Bowel obstruction is limited  CT abdomen pelvis negative for Obstruction.  Will Consult GI   H/O CAD; S/P PCI Denies chest pain.   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  On Stiolto-- change to Pulmicort by pulmonologist   GERD; PPI change to BID  Dysphagia; evaluated by speech, continue with regular diet. GI eval.  Diarrhea;  Acute on chronic. Check GI pathogen, lactoferrin.  Hypomagnesemia; Replete IV Hypokalemia; Replaced.   A fib; Continue with Cardizem and metoprolol./   Estimated body mass index is 22.05 kg/m as calculated from the following:   Height as of this encounter: 5' 2.01" (1.575 m).   Weight as of this encounter: 54.7 kg.   DVT prophylaxis: Lovenox Code Status: DNR Family Communication: Son over Phone; 11/17 Disposition Plan:  Status is: Inpatient Remains inpatient appropriate because: management of Resp failure    Consultants:  CCM Dr Einar Gip  Procedures:  none  Antimicrobials:    Subjective: Diarrhea improved. Have been able to tolerates clear.  Denies abdominal pain   Objective: Vitals:   11/15/22 0700 11/15/22 0843 11/15/22 1020 11/15/22 1200  BP:  98/71  104/61  Pulse:  (!) 109  99  Resp:  20  20  Temp:  98.3 F (36.8 C)  97.9 F (36.6 C)  TempSrc:  Oral  Oral  SpO2: 95% 93% 90%   Weight:      Height:  Intake/Output Summary (Last 24 hours) at 11/15/2022 1354 Last data filed at 11/15/2022 1200 Gross per 24 hour  Intake 600 ml  Output --  Net 600 ml    Filed Weights   11/13/22 0600  Weight: 54.7 kg    Examination:  General exam: NAD Respiratory system: CTA Cardiovascular system: S 1, S 2 RRR Gastrointestinal system: BS present, soft, nt Central nervous system: Alert.   Extremities: Symmetric 5 x 5 power.   Data Reviewed: I have personally reviewed following labs and imaging studies  CBC: Recent Labs  Lab 11/11/22 1158 11/12/22 2245 11/13/22 0348 11/14/22 0458 11/15/22 0222  WBC 6.4 4.9 4.4 5.9 5.6  NEUTROABS 4.1  --   --   --   --   HGB 10.8* 9.9* 10.0* 10.7* 10.5*  HCT 32.6* 29.4* 29.4* 31.0* 31.0*  MCV 88.8 87.8 89.4 86.8 87.6  PLT 118* 112* 110* 125* 112*    Basic Metabolic Panel: Recent Labs  Lab 11/11/22 1158 11/12/22 2245 11/13/22 0348 11/14/22 0458 11/15/22 0222  NA 138  --  134* 137 135  K 3.8  --  3.3* 3.9 4.2  CL 105  --  100 101 100  CO2 24  --  24 26 23   GLUCOSE 144*  --  203* 143* 159*  BUN 19  --  19 22 23   CREATININE 1.23* 1.37* 1.25* 1.85* 1.70*  CALCIUM 9.2  --  8.4* 8.8* 8.8*  MG  --   --  1.2* 1.7  --     GFR: Estimated Creatinine Clearance: 18.8 mL/min (A) (by C-G formula based on SCr of 1.7 mg/dL (H)). Liver Function Tests: No results for input(s): "AST", "ALT", "ALKPHOS", "BILITOT", "PROT", "ALBUMIN" in the last 168 hours. No results for input(s): "LIPASE", "AMYLASE" in the last 168 hours. No results for input(s): "AMMONIA" in the last 168 hours. Coagulation Profile: No results for input(s): "INR", "PROTIME" in the last 168 hours. Cardiac Enzymes: No results for input(s): "CKTOTAL", "CKMB", "CKMBINDEX", "TROPONINI" in the last 168 hours. BNP (last 3 results) No results for input(s): "PROBNP" in the last 8760 hours. HbA1C: Recent Labs    11/12/22 2245  HGBA1C 5.8*    CBG: Recent Labs  Lab 11/14/22 1205 11/14/22 1749 11/14/22 2004 11/15/22 0903 11/15/22 1219  GLUCAP 135* 137* 168* 163* 202*    Lipid Profile: No results for input(s): "CHOL", "HDL", "LDLCALC", "TRIG", "CHOLHDL", "LDLDIRECT" in the last 72 hours. Thyroid Function Tests: No results for input(s): "TSH", "T4TOTAL", "FREET4", "T3FREE", "THYROIDAB" in the last 72 hours. Anemia Panel: No results for input(s): "VITAMINB12",  "FOLATE", "FERRITIN", "TIBC", "IRON", "RETICCTPCT" in the last 72 hours. Sepsis Labs: Recent Labs  Lab 11/12/22 2245  PROCALCITON 0.12     Recent Results (from the past 240 hour(s))  Resp Panel by RT-PCR (Flu A&B, Covid) Anterior Nasal Swab     Status: None   Collection Time: 11/12/22  1:44 PM   Specimen: Anterior Nasal Swab  Result Value Ref Range Status   SARS Coronavirus 2 by RT PCR NEGATIVE NEGATIVE Final    Comment: (NOTE) SARS-CoV-2 target nucleic acids are NOT DETECTED.  The SARS-CoV-2 RNA is generally detectable in upper respiratory specimens during the acute phase of infection. The lowest concentration of SARS-CoV-2 viral copies this assay can detect is 138 copies/mL. A negative result does not preclude SARS-Cov-2 infection and should not be used as the sole basis for treatment or other patient management decisions. A negative result may occur with  improper specimen collection/handling, submission  of specimen other than nasopharyngeal swab, presence of viral mutation(s) within the areas targeted by this assay, and inadequate number of viral copies(<138 copies/mL). A negative result must be combined with clinical observations, patient history, and epidemiological information. The expected result is Negative.  Fact Sheet for Patients:  EntrepreneurPulse.com.au  Fact Sheet for Healthcare Providers:  IncredibleEmployment.be  This test is no t yet approved or cleared by the [place] FDA and  has been authorized for detection and/or diagnosis of SARS-CoV-2 by FDA under an Emergency Use Authorization (EUA). This EUA will remain  in effect (meaning this test can be used) for the duration of the COVID-19 declaration under Section 564(b)(1) of the Act, 21 U.S.C.section 360bbb-3(b)(1), unless the authorization is terminated  or revoked sooner.       Influenza A by PCR NEGATIVE NEGATIVE Final   Influenza B by PCR NEGATIVE NEGATIVE  Final    Comment: (NOTE) The Xpert Xpress SARS-CoV-2/FLU/RSV plus assay is intended as an aid in the diagnosis of influenza from Nasopharyngeal swab specimens and should not be used as a sole basis for treatment. Nasal washings and aspirates are unacceptable for Xpert Xpress SARS-CoV-2/FLU/RSV testing.  Fact Sheet for Patients: EntrepreneurPulse.com.au  Fact Sheet for Healthcare Providers: IncredibleEmployment.be  This test is not yet approved or cleared by the [place] FDA and has been authorized for detection and/or diagnosis of SARS-CoV-2 by FDA under an Emergency Use Authorization (EUA). This EUA will remain in effect (meaning this test can be used) for the duration of the COVID-19 declaration under Section 564(b)(1) of the Act, 21 U.S.C. section 360bbb-3(b)(1), unless the authorization is terminated or revoked.  Performed at [facility], [place] [address] [place], [place] [address]          Radiology Studies: CT ABDOMEN PELVIS WO CONTRAST  Result Date: 11/14/2022 CLINICAL DATA:  Nausea and vomiting. Bowel obstruction suspected. EXAM: CT ABDOMEN AND PELVIS WITHOUT CONTRAST TECHNIQUE: Multidetector CT imaging of the abdomen and pelvis was performed following the standard protocol without IV contrast. RADIATION DOSE REDUCTION: This exam was performed according to the departmental dose-optimization program which includes automated exposure control, adjustment of the mA and/or kV according to patient size and/or use of iterative reconstruction technique. COMPARISON:  Abdominal radiograph earlier today. CT 06/18/2021 FINDINGS: Lower chest: Moderate right and small left pleural effusion, unchanged or equivocally improved from chest CT 2 days ago. Compressive as well as bandlike atelectasis in the included lung bases. Normal heart size with coronary artery calcifications. Hepatobiliary: The previous increased hepatic density is less  pronounced on the current exam. No evidence of focal hepatic lesion. Gallbladder physiologically distended, no calcified stone. Stable common bile duct dilatation at 11 mm likely sequela of prior cholecystectomy. Pancreas: No ductal dilatation or inflammation. Spleen: Normal in size without focal abnormality. Adrenals/Urinary Tract: Normal adrenal glands. Lobulated left renal contours. No hydronephrosis. Simple cyst in the lower pole of the right kidney. No imaging follow-up is needed. No renal calculi. No evidence of solid renal lesion. There is slight retention of IV contrast from prior chest CTA. High-density bladder contents likely represents excretion of IV contrast from prior chest CT. No bladder wall thickening. Stomach/Bowel: The stomach is moderately distended with air and ingested contents. No gastric wall thickening or evidence of gastric outlet obstruction. There is no small bowel distention or small bowel obstruction. Majority of the small and large bowel is fluid-filled. Diverticulosis from the splenic flexure of the colon distally. No diverticulitis. No acute colonic inflammation. The  appendix is not visualized. Vascular/Lymphatic: Moderate to advanced aortic atherosclerosis. No aortic aneurysm. No portal venous or mesenteric gas. No bulky abdominopelvic adenopathy. Reproductive: Status post hysterectomy. No adnexal masses. Other: No free air, free fluid, or intra-abdominal fluid collection. Diminutive fat containing supra-umbilical hernia. Musculoskeletal: Prominent Schmorl's node involving superior endplates of L3 and L5, chronic. Moderate lower lumbar facet hypertrophy. There are no acute or suspicious osseous abnormalities. IMPRESSION: 1. No bowel obstruction. 2. Fluid-filled small and large bowel, can be seen with diarrheal illness. No bowel inflammation. Colonic diverticulosis without diverticulitis. 3. Moderate right and small left pleural effusions, unchanged or equivocally improved from chest  CT 2 days ago. Aortic Atherosclerosis (ICD10-I70.0). Electronically Signed   By: Keith Rake M.D.   On: 11/14/2022 16:28   DG Abd 1 View  Result Date: 11/14/2022 CLINICAL DATA:  Nausea and vomiting EXAM: ABDOMEN - 1 VIEW COMPARISON:  CT Angio 11/12/22 FINDINGS: Marked gastric distention with ingested material. Small bilateral pleural effusions and bibasilar pulmonary opacities, unchanged. Surgical clips in the right upper quadrant. Paucity of bowel gas limits the ability to assess for infection. Within this limitation, no dilated loops of bowel are visualized no pneumatosis. No pneumoperitoneum. IMPRESSION: Marked gastric distention. Assessment for the presence of a small bowel obstruction is limited due to the paucity of small bowel gas. If there is significant clinical concern for a small bowel obstruction, further evaluation with a CT of the abdomen/pelvis is recommended. Electronically Signed   By: Marin Roberts M.D.   On: 11/14/2022 13:00        Scheduled Meds:  budesonide (PULMICORT) nebulizer solution  0.5 mg Nebulization BID   diltiazem  180 mg Oral Daily   enoxaparin (LOVENOX) injection  30 mg Subcutaneous Q24H   insulin aspart  0-9 Units Subcutaneous TID WC   ipratropium  0.5 mg Nebulization BID   levalbuterol  1.25 mg Nebulization BID   levothyroxine  88 mcg Oral Q0600   metoprolol succinate  50 mg Oral Daily   pantoprazole (PROTONIX) IV  40 mg Intravenous Q12H   rosuvastatin  20 mg Oral Daily   Investigational - Study Medication  1 tablet Oral QPM   Investigational - Study Medication  1 tablet Oral q morning   Investigational - Study Medication  1 tablet Oral q morning   Continuous Infusions:   LOS: 2 days    Time spent: 35 minutes    Albin Duckett A Keanna Tugwell, MD [facility]   If 7PM-7AM, please contact night-coverage [email or web address]  11/15/2022, 1:54 PM

## 2022-11-16 ENCOUNTER — Inpatient Hospital Stay (HOSPITAL_COMMUNITY): Payer: Medicare Other

## 2022-11-16 DIAGNOSIS — R0902 Hypoxemia: Secondary | ICD-10-CM | POA: Diagnosis not present

## 2022-11-16 LAB — CBC
HCT: 30.1 % — ABNORMAL LOW (ref 36.0–46.0)
Hemoglobin: 10.1 g/dL — ABNORMAL LOW (ref 12.0–15.0)
MCH: 29.6 pg (ref 26.0–34.0)
MCHC: 33.6 g/dL (ref 30.0–36.0)
MCV: 88.3 fL (ref 80.0–100.0)
Platelets: 109 10*3/uL — ABNORMAL LOW (ref 150–400)
RBC: 3.41 MIL/uL — ABNORMAL LOW (ref 3.87–5.11)
RDW: 13.3 % (ref 11.5–15.5)
WBC: 4.7 10*3/uL (ref 4.0–10.5)
nRBC: 0 % (ref 0.0–0.2)

## 2022-11-16 LAB — GLUCOSE, CAPILLARY
Glucose-Capillary: 171 mg/dL — ABNORMAL HIGH (ref 70–99)
Glucose-Capillary: 100 mg/dL — ABNORMAL HIGH (ref 70–99)
Glucose-Capillary: 158 mg/dL — ABNORMAL HIGH (ref 70–99)
Glucose-Capillary: 168 mg/dL — ABNORMAL HIGH (ref 70–99)

## 2022-11-16 LAB — BASIC METABOLIC PANEL
Anion gap: 10 (ref 5–15)
BUN: 22 mg/dL (ref 8–23)
CO2: 23 mmol/L (ref 22–32)
Calcium: 8.5 mg/dL — ABNORMAL LOW (ref 8.9–10.3)
Chloride: 101 mmol/L (ref 98–111)
Creatinine, Ser: 1.65 mg/dL — ABNORMAL HIGH (ref 0.44–1.00)
GFR, Estimated: 30 mL/min — ABNORMAL LOW (ref >60)
Glucose, Bld: 180 mg/dL — ABNORMAL HIGH (ref 70–99)
Potassium: 4.1 mmol/L (ref 3.5–5.1)
Sodium: 134 mmol/L — ABNORMAL LOW (ref 135–145)

## 2022-11-16 MED ORDER — ONDANSETRON HCL 4 MG/2ML IJ SOLN
4.0000 mg | Freq: Three times a day (TID) | INTRAMUSCULAR | Status: DC
  Administered 2022-11-16 – 2022-11-21 (×14): 4 mg via INTRAVENOUS
  Filled 2022-11-16 (×15): qty 2

## 2022-11-16 NOTE — Consult Note (Signed)
[facility] Gastroenterology Consultation Note  Referring Provider: No ref. provider found Primary Care Physician:  [name], [place] Primary Gastroenterologist:  Dr. Therisa Doyne  Reason for Consultation:  nausea/vomiting  HPI: [name] is a 88 y.o. female admitted respiratory failure requiring ICU stay.  Breathing improving, but not to (already tenuous) baseline.  Has post-prandial nausea and vomiting.  Chronicity uncertain, but she thinks more than one year.  EGD June 2022 (albeit for evaluation of anemia/melena) was unrevealing.  CT this admission shows moderate gastric distention, but normal caliber colon/small bowel.  No blood in stool.  Chronic constipation, for which she has taken fiber supplementation (Metamucil).  Colonoscopy June 2022 few small polyps removed.   Past Medical History:  Diagnosis Date   Acute diverticulitis 06/2017   Anemia 06/2017   CAD (coronary artery disease) 03/13/2012   Diabetes mellitus    Dysrhythmia    ATRIAL FIB   Fall 07/07/2017   HTN (hypertension) 03/13/2012   Hypothyroid 03/13/2012   Lung cancer ([place])    Shortness of breath    Stroke ([place])    Syncope and collapse 04/02/2016   Type II or unspecified type diabetes mellitus without mention of complication, not stated as uncontrolled 03/13/2012    Past Surgical History:  Procedure Laterality Date   ABDOMINAL HYSTERECTOMY     BIOPSY  06/13/2021   Procedure: BIOPSY;  Surgeon: Ronnette Juniper, MD;  Location: [facility];  Service: Gastroenterology;;   BREAST EXCISIONAL BIOPSY Left    BREAST SURGERY     benign tumors removed in [place]  04/20/2012   Procedure: CARDIOVERSION;  Surgeon: Laverda Page, MD;  Location: [place];  Service: Cardiovascular;  Laterality: N/A;   CARDIOVERSION N/A 05/10/2013   Procedure: CARDIOVERSION;  Surgeon: Laverda Page, MD;  Location: [place];  Service: Cardiovascular;  Laterality: N/A;   CHOLECYSTECTOMY      COLONOSCOPY WITH PROPOFOL N/A 06/13/2021   Procedure: COLONOSCOPY WITH PROPOFOL;  Surgeon: Ronnette Juniper, MD;  Location: [facility];  Service: Gastroenterology;  Laterality: N/A;   ESOPHAGOGASTRODUODENOSCOPY (EGD) WITH PROPOFOL N/A 06/13/2021   Procedure: ESOPHAGOGASTRODUODENOSCOPY (EGD) WITH PROPOFOL;  Surgeon: Ronnette Juniper, MD;  Location: [facility];  Service: Gastroenterology;  Laterality: N/A;   HEMORRHOID SURGERY     jaw replacement     LEFT HEART CATHETERIZATION WITH CORONARY ANGIOGRAM N/A 04/20/2015   Procedure: LEFT HEART CATHETERIZATION WITH CORONARY ANGIOGRAM;  Surgeon: Adrian Prows, MD;  Location: [facility] CATH [facility];  Service: Cardiovascular;  Laterality: N/A;   tumors     back of head    Prior to Admission medications   Medication Sig Start Date End Date Taking? Authorizing Provider  acetaminophen (TYLENOL) 500 MG tablet Take 500-1,000 mg by mouth every 6 (six) hours as needed for moderate pain.   Yes [provider]  calcium carbonate (TUMS) 500 MG chewable tablet Chew 1 tablet by mouth daily.   Yes [provider]  Calcium Citrate-Vitamin D ([employer] CALCIUM CITRATE +D3 MINI PO) Take 1 capsule by mouth daily.   Yes [provider]  cyanocobalamin (VITAMIN B12) 1000 MCG tablet Take 3,000 mcg by mouth daily.   Yes [provider]  diltiazem (DILT-XR) 180 MG 24 hr capsule TAKE 1 CAPSULE(180 MG) BY MOUTH DAILY 10/21/22  Yes Adrian Prows, MD  Fluocinolone Acetonide 0.01 % OIL Place 5 drops in ear(s) daily. 07/25/22  Yes [provider]  insulin degludec (TRESIBA FLEXTOUCH) 100 UNIT/ML FlexTouch Pen Inject 8 Units  into the skin at bedtime. 05/13/22  Yes [provider]  lactose free nutrition (BOOST) LIQD Take 237 mLs by mouth daily.   Yes [provider]  levothyroxine (SYNTHROID) 88 MCG tablet Take 88 mcg by mouth daily. 07/25/22  Yes [provider]  metoprolol succinate (TOPROL-XL) 50 MG 24 hr tablet Take 1 tablet (50 mg total) by  mouth daily. 06/20/22  Yes Adrian Prows, MD  rosuvastatin (CRESTOR) 20 MG tablet Take 1 tablet (20 mg total) by mouth daily. 06/20/22 12/17/22 Yes Adrian Prows, MD  sitaGLIPtin (JANUVIA) 50 MG tablet Take 50 mg by mouth daily.   Yes [provider]  Tiotropium Bromide-Olodaterol (STIOLTO RESPIMAT) 2.5-2.5 MCG/ACT AERS Inhale 2 puffs into the lungs daily. 08/26/22  Yes Freddi Starr, MD  Insulin Pen Needle (NOVOFINE PLUS PEN NEEDLE) 32G X 4 MM MISC ONE 04/10/22   [provider]  pantoprazole (PROTONIX) 40 MG tablet Take 1 tablet (40 mg total) by mouth daily. Patient not taking: Reported on 11/12/2022 08/26/22   Freddi Starr, MD    Current Facility-Administered Medications  Medication Dose Route Frequency Provider Last Rate Last Admin   acetaminophen (TYLENOL) tablet 650 mg  650 mg Oral Q6H PRN Damita Lack, MD   650 mg at 11/14/22 0505   budesonide (PULMICORT) nebulizer solution 0.5 mg  0.5 mg Nebulization BID Noe Gens L, NP   0.5 mg at 11/15/22 2221   diltiazem (CARDIZEM CD) 24 hr capsule 180 mg  180 mg Oral Daily Amin, Ankit Chirag, MD   180 mg at 11/16/22 0915   enoxaparin (LOVENOX) injection 30 mg  30 mg Subcutaneous Q24H Pham, Minh Q, RPH-CPP   30 mg at 11/15/22 2140   guaiFENesin (ROBITUSSIN) 100 MG/5ML liquid 5 mL  5 mL Oral Q4H PRN Amin, Ankit Chirag, MD       hydrALAZINE (APRESOLINE) injection 10 mg  10 mg Intravenous Q4H PRN Amin, Ankit Chirag, MD       insulin aspart (novoLOG) injection 0-9 Units  0-9 Units Subcutaneous TID WC Amin, Ankit Chirag, MD   2 Units at 11/16/22 0915   ipratropium (ATROVENT) nebulizer solution 0.5 mg  0.5 mg Nebulization BID Regalado, Belkys A, MD   0.5 mg at 11/15/22 2219   levalbuterol (XOPENEX) nebulizer solution 1.25 mg  1.25 mg Nebulization BID Regalado, Belkys A, MD   1.25 mg at 11/15/22 2319   levothyroxine (SYNTHROID) tablet 88 mcg  88 mcg Oral Q0600 Amin, Ankit Chirag, MD   88 mcg at 11/16/22 0617   metoprolol succinate  (TOPROL-XL) 24 hr tablet 50 mg  50 mg Oral Daily Amin, Ankit Chirag, MD   50 mg at 11/16/22 0915   metoprolol tartrate (LOPRESSOR) injection 5 mg  5 mg Intravenous Q4H PRN Amin, Ankit Chirag, MD   5 mg at 11/14/22 1848   ondansetron (ZOFRAN) injection 4 mg  4 mg Intravenous Q6H PRN Amin, Ankit Chirag, MD   4 mg at 11/15/22 2137   pantoprazole (PROTONIX) injection 40 mg  40 mg Intravenous Q12H Regalado, Belkys A, MD   40 mg at 11/16/22 0915   rosuvastatin (CRESTOR) tablet 20 mg  20 mg Oral Daily Amin, Ankit Chirag, MD   20 mg at 11/16/22 0915   senna-docusate (Senokot-S) tablet 1 tablet  1 tablet Oral QHS PRN Amin, Ankit Chirag, MD       STUDY - [facility] - apixaban 2.5 mg, or apixaban 5 mg, or placebo tablet (Evening Dose) (PI: [place])  1 tablet  Oral QPM Adrian Prows, MD   1 tablet at 11/15/22 1710   STUDY - [facility] - apixaban 2.5 mg, or apixaban 5 mg, or placebo tablet (Morning Dose) (PI: [place])  1 tablet Oral q morning Adrian Prows, MD   1 tablet at 11/16/22 1019   STUDY - [place] [id number] Cecille Rubin) 50 mg or placebo tablet (PI: [place])  1 tablet Oral q morning Adrian Prows, MD   1 tablet at 11/16/22 1017   traZODone (DESYREL) tablet 50 mg  50 mg Oral QHS PRN Damita Lack, MD        Allergies as of 11/11/2022 - Review Complete 11/11/2022  Allergen Reaction Noted   Codeine Nausea And Vomiting 10/02/2012   Hydrocodone-acetaminophen Nausea And Vomiting 07/14/2013    Family History  Problem Relation Age of Onset   Breast cancer Mother    Aneurysm Father        Likely thoracic aneurysm per patient   Hypertension Sister    Heart disease Sister    Diabetes type II Sister    Diabetes type II Sister    Diverticulitis Sister    Diabetes type II Sister    Stroke Sister    Heart disease Brother    Melanoma Brother    Melanoma Brother     Social History   Socioeconomic History   Marital status: Widowed    Spouse name: Not on file   Number of children: 3   Years of education:  Not on file   Highest education level: Not on file  Occupational History   Not on file  Tobacco Use   Smoking status: Never   Smokeless tobacco: Never  Vaping Use   Vaping Use: Never used  Substance and Sexual Activity   Alcohol use: No   Drug use: No   Sexual activity: Yes    Birth control/protection: None  Other Topics Concern   Not on file  Social History Narrative   Not on file   Social Determinants of Health   Financial Resource Strain: Not on file  Food Insecurity: Not on file  Transportation Needs: Not on file  Physical Activity: Not on file  Stress: Not on file  Social Connections: Not on file  Intimate Partner Violence: Not on file    Review of Systems: As per HPI, all others negative.  Physical Exam: Vital signs in last 24 hours: Temp:  [98 F (36.7 C)-98.3 F (36.8 C)] 98.3 F (36.8 C) (11/19 1302) Pulse Rate:  [85-94] 94 (11/19 0302) Resp:  [20] 20 (11/19 0302) BP: (100-110)/(63-64) 100/64 (11/19 1302) SpO2:  [88 %-95 %] 93 % (11/19 0302) Last BM Date : 11/15/22 General:   Elderly, overweight, frail-appearing, mild/moderate respiratory distress with minimal movement (sitting upright in bed from lying position, for example) Head:  Normocephalic and atraumatic. Eyes:  Sclera clear, no icterus.   Conjunctiva pink. Ears:  Normal auditory acuity. Nose:  No deformity, discharge,  or lesions. Mouth:  No deformity or lesions.  Oropharynx pink & moist. Neck:  Supple; no masses or thyromegaly. Lungs:  Mild/moderate distress with minimal movement as above Abdomen:  Soft, mild distended, mild tympanic; non-tender. No masses, hepatosplenomegaly or hernias noted. Normal bowel sounds, without guarding, and without rebound.     Msk:  Atrophied symmetrically without gross deformities. Normal posture. Pulses:  Normal pulses noted. Extremities:  Without clubbing or edema. Neurologic:  Alert and  oriented x4;  grossly normal neurologically. Skin:  Intact without  significant lesions or rashes.  Psych:  Alert and cooperative. Normal mood and affect.   Lab Results: Recent Labs    11/14/22 0458 11/15/22 0222 11/16/22 0946  WBC 5.9 5.6 4.7  HGB 10.7* 10.5* 10.1*  HCT 31.0* 31.0* 30.1*  PLT 125* 112* 109*   BMET Recent Labs    11/14/22 0458 11/15/22 0222 11/16/22 0946  NA 137 135 134*  K 3.9 4.2 4.1  CL 101 100 101  CO2 26 23 23   GLUCOSE 143* 159* 180*  BUN 22 23 22   CREATININE 1.85* 1.70* 1.65*  CALCIUM 8.8* 8.8* 8.5*   LFT No results for input(s): "PROT", "ALBUMIN", "AST", "ALT", "ALKPHOS", "BILITOT", "BILIDIR", "IBILI" in the last 72 hours. PT/INR No results for input(s): "LABPROT", "INR" in the last 72 hours.  Studies/Results: DG CHEST PORT 1 VIEW  Result Date: 11/16/2022 CLINICAL DATA:  Acute respiratory failure EXAM: PORTABLE CHEST 1 VIEW COMPARISON:  COMPARISON 11/11/2022 FINDINGS: Normal cardiac silhouette. Bilateral pleural effusions increased from prior. There is increased central venous congestion interstitial edema. Platelike atelectasis in the mid lungs. IMPRESSION: Increasing pleural effusions and interstitial edema Increased atelectasis in the mid lungs. Electronically Signed   By: Suzy Bouchard M.D.   On: 11/16/2022 11:06   CT ABDOMEN PELVIS WO CONTRAST  Result Date: 11/14/2022 CLINICAL DATA:  Nausea and vomiting. Bowel obstruction suspected. EXAM: CT ABDOMEN AND PELVIS WITHOUT CONTRAST TECHNIQUE: Multidetector CT imaging of the abdomen and pelvis was performed following the standard protocol without IV contrast. RADIATION DOSE REDUCTION: This exam was performed according to the departmental dose-optimization program which includes automated exposure control, adjustment of the mA and/or kV according to patient size and/or use of iterative reconstruction technique. COMPARISON:  Abdominal radiograph earlier today. CT 06/18/2021 FINDINGS: Lower chest: Moderate right and small left pleural effusion, unchanged or  equivocally improved from chest CT 2 days ago. Compressive as well as bandlike atelectasis in the included lung bases. Normal heart size with coronary artery calcifications. Hepatobiliary: The previous increased hepatic density is less pronounced on the current exam. No evidence of focal hepatic lesion. Gallbladder physiologically distended, no calcified stone. Stable common bile duct dilatation at 11 mm likely sequela of prior cholecystectomy. Pancreas: No ductal dilatation or inflammation. Spleen: Normal in size without focal abnormality. Adrenals/Urinary Tract: Normal adrenal glands. Lobulated left renal contours. No hydronephrosis. Simple cyst in the lower pole of the right kidney. No imaging follow-up is needed. No renal calculi. No evidence of solid renal lesion. There is slight retention of IV contrast from prior chest CTA. High-density bladder contents likely represents excretion of IV contrast from prior chest CT. No bladder wall thickening. Stomach/Bowel: The stomach is moderately distended with air and ingested contents. No gastric wall thickening or evidence of gastric outlet obstruction. There is no small bowel distention or small bowel obstruction. Majority of the small and large bowel is fluid-filled. Diverticulosis from the splenic flexure of the colon distally. No diverticulitis. No acute colonic inflammation. The appendix is not visualized. Vascular/Lymphatic: Moderate to advanced aortic atherosclerosis. No aortic aneurysm. No portal venous or mesenteric gas. No bulky abdominopelvic adenopathy. Reproductive: Status post hysterectomy. No adnexal masses. Other: No free air, free fluid, or intra-abdominal fluid collection. Diminutive fat containing supra-umbilical hernia. Musculoskeletal: Prominent Schmorl's node involving superior endplates of L3 and L5, chronic. Moderate lower lumbar facet hypertrophy. There are no acute or suspicious osseous abnormalities. IMPRESSION: 1. No bowel obstruction. 2.  Fluid-filled small and large bowel, can be seen with diarrheal illness. No bowel inflammation. Colonic diverticulosis without diverticulitis. 3. Moderate  right and small left pleural effusions, unchanged or equivocally improved from chest CT 2 days ago. Aortic Atherosclerosis (ICD10-I70.0). Electronically Signed   By: Keith Rake M.D.   On: 11/14/2022 16:28    Impression:   Nausea and vomiting.  Chronicity uncertain.  She feels like this has been going on for at least one year.  Endoscopy unrevealing June 2022 but done for anemia. Hypoxic respiratory failure. Concern recurrent aspiration. Neuroendocrine lung cancer. Chronic respiratory difficulty, even prior to #2. Moderately distended stomach on CT.  Artifactual versus motility (gastroparesis) issue versus partial gastric outlet obstruction.  Plan:   De-escalate diet to clear liquids. Change ondansetron to scheduled (not prn) administration. Patient's respiratory status at this point still seems tenuous, despite some improvement.  Both patient and I harbor concerns about proceeding with endoscopy forthwith. If no improvement despite #1 and #2 above, consider adding metoclopramide.  If still having problems after this, and respiratory status permits, would consider endoscopy to rule out partial gastric outlet obstruction or other sources of nausea/vomiting. [facility] will follow.   LOS: 3 days   Gar Glance M  11/16/2022, 2:11 PM  Cell [phone] If no answer or after 5 PM call [phone]

## 2022-11-16 NOTE — Progress Notes (Signed)
Patient placed on overnight pulse oximetry, RT will continue to monitor.

## 2022-11-16 NOTE — Progress Notes (Signed)
PROGRESS NOTE    [name]  FOY:[id number] DOB: 04/15/1934 DOA: 11/11/2022 PCP: [name], FNP   Brief Narrative: 88 year old past medical history significant for CAD, pulmonary hypertension, right subclavian artery stenosis, permanent A-fib, diabetes type 2, hypertension, stage IV low-grade Carcinoid/neuroendocrine presents complaining of worsening shortness of breath on exertion over the last past several months, but worse over the last several days was advised to present to the ED by Dr. Einar Gip. [name] also reports some chocking episodes with meals. CTA negative for PE, moderate right and small left pleural effusion with associated atelectasis, and newly mildly enlarged mediastinal and right hilar lymph node reactive need CT follow-up.  Numerous bilateral solid pulmonary nodules unchanged from 2018.   Assessment & Plan:   Principal Problem:   Hypoxia Active Problems:   Carcinoid tumor of lung   Atrial fibrillation (HCC) CHA2DS2-VASc Score 7   HTN (hypertension)   DM2 (diabetes mellitus, type 2) (HCC)   CAD (coronary artery disease)   Anemia   1-Dyspnea on Exertion:  CTA chest with moderate right pleural effusion.  Received IV lasix times 3 doses.  Pulmonology consulted for evaluation of Amiodarone toxicity ? , as recommended by Dr Einar Gip.  Plan to check for  home oxygen needs.  Continue with nebulizer.  CCM consulted. Dyspnea thought to be multifactorial. pleural effusion, acute on chronic diastolic HF, Low grade lung neuroendocrine tumor.  Started on Pulmicort.  Nocturnal Oxygen sat. Patient desats at Night. Will order home oxygen.  Chest x ray; BL pleural effusion increased, await Pulmonology recommendation.   2-Acute Hypoxic resp failure:  Multifactorial, secondary to pleural effusion, acute on chronic diastolic HF, Low grade lung neuroendocrine tumor.  Started on Pulmicort nebulizer.  Hold IV lasix due to AKI.  Needs evaluation for home oxygen.     3-Acute on Chronic Diastolic HF  Presents with dyspnea, pleural effusion, increase BNP.  Hold lasix due to AKI.   AKI; in setting diuretics. Had diarrhea.  Vomiting this am.  Will hold lasix. Received IV fluids.  Cr down to 1.7  Nausea, vomiting:  Dysphagia.  KUB; marked gastric distension. Assessment for Small Bowel obstruction is limited  CT abdomen pelvis negative for Obstruction.  Plan for schedule zofran and clear liquid diet. If doesn't help could try Reglan.  Esophagogram ordered.   H/O CAD; S/P PCI Denies chest pain.   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  On Stiolto-- change to Pulmicort by pulmonologist   GERD; PPI change to BID   Diarrhea;  Acute on chronic. Check GI pathogen, lactoferrin.  Hypomagnesemia; Replaced. Check labs in am.  Hypokalemia; Replaced.   A fib; Continue with Cardizem and metoprolol./   Estimated body mass index is 22.05 kg/m as calculated from the following:   Height as of this encounter: 5' 2.01" (1.575 m).   Weight as of this encounter: 54.7 kg.   DVT prophylaxis: Lovenox Code Status: DNR Family Communication: Son over Phone; 11/19 Disposition Plan:  Status is: Inpatient Remains inpatient appropriate because: management of Resp failure    Consultants:  CCM Dr Einar Gip  Procedures:  none  Antimicrobials:    Subjective: She had large BM this am.  She has been drinking fluids.   Objective: Vitals:   11/15/22 1951 11/15/22 2303 11/16/22 0302 11/16/22 1302  BP: 103/63 105/64 102/63 100/64  Pulse: 91 94 94   Resp: 20 20 20    Temp: 98 F (36.7 C) 98.1 F (36.7 C) 98 F (36.7 C) 98.3  F (36.8 C)  TempSrc: Oral Oral Oral Oral  SpO2: 92% 95% 93%   Weight:      Height:       No intake or output data in the 24 hours ending 11/16/22 1439  Filed Weights   11/13/22 0600  Weight: 54.7 kg    Examination:  General exam: NAD Respiratory system: decreased breath sounds bases.  Cardiovascular system: S 1, S 2  RRR Gastrointestinal system: BS present, soft, nt Central nervous system: Alert Extremities: Symmetric 5 x 5 power.   Data Reviewed: I have personally reviewed following labs and imaging studies  CBC: Recent Labs  Lab 11/11/22 1158 11/12/22 2245 11/13/22 0348 11/14/22 0458 11/15/22 0222 11/16/22 0946  WBC 6.4 4.9 4.4 5.9 5.6 4.7  NEUTROABS 4.1  --   --   --   --   --   HGB 10.8* 9.9* 10.0* 10.7* 10.5* 10.1*  HCT 32.6* 29.4* 29.4* 31.0* 31.0* 30.1*  MCV 88.8 87.8 89.4 86.8 87.6 88.3  PLT 118* 112* 110* 125* 112* 109*    Basic Metabolic Panel: Recent Labs  Lab 11/11/22 1158 11/12/22 2245 11/13/22 0348 11/14/22 0458 11/15/22 0222 11/16/22 0946  NA 138  --  134* 137 135 134*  K 3.8  --  3.3* 3.9 4.2 4.1  CL 105  --  100 101 100 101  CO2 24  --  24 26 23 23   GLUCOSE 144*  --  203* 143* 159* 180*  BUN 19  --  19 22 23 22   CREATININE 1.23* 1.37* 1.25* 1.85* 1.70* 1.65*  CALCIUM 9.2  --  8.4* 8.8* 8.8* 8.5*  MG  --   --  1.2* 1.7  --   --     GFR: Estimated Creatinine Clearance: 19.4 mL/min (A) (by C-G formula based on SCr of 1.65 mg/dL (H)). Liver Function Tests: No results for input(s): "AST", "ALT", "ALKPHOS", "BILITOT", "PROT", "ALBUMIN" in the last 168 hours. No results for input(s): "LIPASE", "AMYLASE" in the last 168 hours. No results for input(s): "AMMONIA" in the last 168 hours. Coagulation Profile: No results for input(s): "INR", "PROTIME" in the last 168 hours. Cardiac Enzymes: No results for input(s): "CKTOTAL", "CKMB", "CKMBINDEX", "TROPONINI" in the last 168 hours. BNP (last 3 results) No results for input(s): "PROBNP" in the last 8760 hours. HbA1C: No results for input(s): "HGBA1C" in the last 72 hours.  CBG: Recent Labs  Lab 11/15/22 1219 11/15/22 1603 11/15/22 2246 11/16/22 0833 11/16/22 1207  GLUCAP 202* 122* 163* 171* 100*    Lipid Profile: No results for input(s): "CHOL", "HDL", "LDLCALC", "TRIG", "CHOLHDL", "LDLDIRECT" in the last  72 hours. Thyroid Function Tests: No results for input(s): "TSH", "T4TOTAL", "FREET4", "T3FREE", "THYROIDAB" in the last 72 hours. Anemia Panel: No results for input(s): "VITAMINB12", "FOLATE", "FERRITIN", "TIBC", "IRON", "RETICCTPCT" in the last 72 hours. Sepsis Labs: Recent Labs  Lab 11/12/22 2245  PROCALCITON 0.12     Recent Results (from the past 240 hour(s))  Resp Panel by RT-PCR (Flu A&B, Covid) Anterior Nasal Swab     Status: None   Collection Time: 11/12/22  1:44 PM   Specimen: Anterior Nasal Swab  Result Value Ref Range Status   SARS Coronavirus 2 by RT PCR NEGATIVE NEGATIVE Final    Comment: (NOTE) SARS-CoV-2 target nucleic acids are NOT DETECTED.  The SARS-CoV-2 RNA is generally detectable in upper respiratory specimens during the acute phase of infection. The lowest concentration of SARS-CoV-2 viral copies this assay can detect is 138 copies/mL. A  negative result does not preclude SARS-Cov-2 infection and should not be used as the sole basis for treatment or other patient management decisions. A negative result may occur with  improper specimen collection/handling, submission of specimen other than nasopharyngeal swab, presence of viral mutation(s) within the areas targeted by this assay, and inadequate number of viral copies(<138 copies/mL). A negative result must be combined with clinical observations, patient history, and epidemiological information. The expected result is Negative.  Fact Sheet for Patients:  EntrepreneurPulse.com.au  Fact Sheet for Healthcare Providers:  IncredibleEmployment.be  This test is no t yet approved or cleared by the [place] FDA and  has been authorized for detection and/or diagnosis of SARS-CoV-2 by FDA under an Emergency Use Authorization (EUA). This EUA will remain  in effect (meaning this test can be used) for the duration of the COVID-19 declaration under Section 564(b)(1) of the Act,  21 U.S.C.section 360bbb-3(b)(1), unless the authorization is terminated  or revoked sooner.       Influenza A by PCR NEGATIVE NEGATIVE Final   Influenza B by PCR NEGATIVE NEGATIVE Final    Comment: (NOTE) The Xpert Xpress SARS-CoV-2/FLU/RSV plus assay is intended as an aid in the diagnosis of influenza from Nasopharyngeal swab specimens and should not be used as a sole basis for treatment. Nasal washings and aspirates are unacceptable for Xpert Xpress SARS-CoV-2/FLU/RSV testing.  Fact Sheet for Patients: EntrepreneurPulse.com.au  Fact Sheet for Healthcare Providers: IncredibleEmployment.be  This test is not yet approved or cleared by the [place] FDA and has been authorized for detection and/or diagnosis of SARS-CoV-2 by FDA under an Emergency Use Authorization (EUA). This EUA will remain in effect (meaning this test can be used) for the duration of the COVID-19 declaration under Section 564(b)(1) of the Act, 21 U.S.C. section 360bbb-3(b)(1), unless the authorization is terminated or revoked.  Performed at [facility], [place] [address], [place], [place] [address]          Radiology Studies: DG CHEST PORT 1 VIEW  Result Date: 11/16/2022 CLINICAL DATA:  Acute respiratory failure EXAM: PORTABLE CHEST 1 VIEW COMPARISON:  COMPARISON 11/11/2022 FINDINGS: Normal cardiac silhouette. Bilateral pleural effusions increased from prior. There is increased central venous congestion interstitial edema. Platelike atelectasis in the mid lungs. IMPRESSION: Increasing pleural effusions and interstitial edema Increased atelectasis in the mid lungs. Electronically Signed   By: Suzy Bouchard M.D.   On: 11/16/2022 11:06   CT ABDOMEN PELVIS WO CONTRAST  Result Date: 11/14/2022 CLINICAL DATA:  Nausea and vomiting. Bowel obstruction suspected. EXAM: CT ABDOMEN AND PELVIS WITHOUT CONTRAST TECHNIQUE: Multidetector CT imaging of the abdomen and pelvis  was performed following the standard protocol without IV contrast. RADIATION DOSE REDUCTION: This exam was performed according to the departmental dose-optimization program which includes automated exposure control, adjustment of the mA and/or kV according to patient size and/or use of iterative reconstruction technique. COMPARISON:  Abdominal radiograph earlier today. CT 06/18/2021 FINDINGS: Lower chest: Moderate right and small left pleural effusion, unchanged or equivocally improved from chest CT 2 days ago. Compressive as well as bandlike atelectasis in the included lung bases. Normal heart size with coronary artery calcifications. Hepatobiliary: The previous increased hepatic density is less pronounced on the current exam. No evidence of focal hepatic lesion. Gallbladder physiologically distended, no calcified stone. Stable common bile duct dilatation at 11 mm likely sequela of prior cholecystectomy. Pancreas: No ductal dilatation or inflammation. Spleen: Normal in size without focal abnormality. Adrenals/Urinary Tract: Normal adrenal glands. Lobulated left renal  contours. No hydronephrosis. Simple cyst in the lower pole of the right kidney. No imaging follow-up is needed. No renal calculi. No evidence of solid renal lesion. There is slight retention of IV contrast from prior chest CTA. High-density bladder contents likely represents excretion of IV contrast from prior chest CT. No bladder wall thickening. Stomach/Bowel: The stomach is moderately distended with air and ingested contents. No gastric wall thickening or evidence of gastric outlet obstruction. There is no small bowel distention or small bowel obstruction. Majority of the small and large bowel is fluid-filled. Diverticulosis from the splenic flexure of the colon distally. No diverticulitis. No acute colonic inflammation. The appendix is not visualized. Vascular/Lymphatic: Moderate to advanced aortic atherosclerosis. No aortic aneurysm. No portal  venous or mesenteric gas. No bulky abdominopelvic adenopathy. Reproductive: Status post hysterectomy. No adnexal masses. Other: No free air, free fluid, or intra-abdominal fluid collection. Diminutive fat containing supra-umbilical hernia. Musculoskeletal: Prominent Schmorl's node involving superior endplates of L3 and L5, chronic. Moderate lower lumbar facet hypertrophy. There are no acute or suspicious osseous abnormalities. IMPRESSION: 1. No bowel obstruction. 2. Fluid-filled small and large bowel, can be seen with diarrheal illness. No bowel inflammation. Colonic diverticulosis without diverticulitis. 3. Moderate right and small left pleural effusions, unchanged or equivocally improved from chest CT 2 days ago. Aortic Atherosclerosis (ICD10-I70.0). Electronically Signed   By: Keith Rake M.D.   On: 11/14/2022 16:28        Scheduled Meds:  budesonide (PULMICORT) nebulizer solution  0.5 mg Nebulization BID   diltiazem  180 mg Oral Daily   enoxaparin (LOVENOX) injection  30 mg Subcutaneous Q24H   insulin aspart  0-9 Units Subcutaneous TID WC   ipratropium  0.5 mg Nebulization BID   levalbuterol  1.25 mg Nebulization BID   levothyroxine  88 mcg Oral Q0600   metoprolol succinate  50 mg Oral Daily   ondansetron (ZOFRAN) IV  4 mg Intravenous Q8H   pantoprazole (PROTONIX) IV  40 mg Intravenous Q12H   rosuvastatin  20 mg Oral Daily   Investigational - Study Medication  1 tablet Oral QPM   Investigational - Study Medication  1 tablet Oral q morning   Investigational - Study Medication  1 tablet Oral q morning   Continuous Infusions:   LOS: 3 days    Time spent: 35 minutes    Brilee Port A Teylor Wolven, MD [facility]   If 7PM-7AM, please contact night-coverage [email or web address]  11/16/2022, 2:39 PM

## 2022-11-17 ENCOUNTER — Inpatient Hospital Stay (HOSPITAL_COMMUNITY): Payer: Medicare Other

## 2022-11-17 DIAGNOSIS — R0902 Hypoxemia: Secondary | ICD-10-CM | POA: Diagnosis not present

## 2022-11-17 LAB — POCT I-STAT EG7
Acid-base deficit: 1 mmol/L (ref 0.0–2.0)
Bicarbonate: 25.3 mmol/L (ref 20.0–28.0)
Calcium, Ion: 1.16 mmol/L (ref 1.15–1.40)
HCT: 30 % — ABNORMAL LOW (ref 36.0–46.0)
Hemoglobin: 10.2 g/dL — ABNORMAL LOW (ref 12.0–15.0)
O2 Saturation: 99 %
Potassium: 3.5 mmol/L (ref 3.5–5.1)
Sodium: 141 mmol/L (ref 135–145)
TCO2: 27 mmol/L (ref 22–32)
pCO2, Ven: 46.1 mmHg (ref 44–60)
pH, Ven: 7.347 (ref 7.25–7.43)
pO2, Ven: 138 mmHg — ABNORMAL HIGH (ref 32–45)

## 2022-11-17 LAB — BASIC METABOLIC PANEL
Anion gap: 8 (ref 5–15)
BUN: 19 mg/dL (ref 8–23)
CO2: 24 mmol/L (ref 22–32)
Calcium: 8.5 mg/dL — ABNORMAL LOW (ref 8.9–10.3)
Chloride: 103 mmol/L (ref 98–111)
Creatinine, Ser: 1.47 mg/dL — ABNORMAL HIGH (ref 0.44–1.00)
GFR, Estimated: 35 mL/min — ABNORMAL LOW (ref >60)
Glucose, Bld: 135 mg/dL — ABNORMAL HIGH (ref 70–99)
Potassium: 4.4 mmol/L (ref 3.5–5.1)
Sodium: 135 mmol/L (ref 135–145)

## 2022-11-17 LAB — HEPATIC FUNCTION PANEL
ALT: 17 U/L (ref 0–44)
AST: 25 U/L (ref 15–41)
Albumin: 3 g/dL — ABNORMAL LOW (ref 3.5–5.0)
Alkaline Phosphatase: 47 U/L (ref 38–126)
Bilirubin, Direct: 0.1 mg/dL (ref 0.0–0.2)
Indirect Bilirubin: 0.5 mg/dL (ref 0.3–0.9)
Total Bilirubin: 0.6 mg/dL (ref 0.3–1.2)
Total Protein: 5.9 g/dL — ABNORMAL LOW (ref 6.5–8.1)

## 2022-11-17 LAB — CBC
HCT: 30.1 % — ABNORMAL LOW (ref 36.0–46.0)
Hemoglobin: 10 g/dL — ABNORMAL LOW (ref 12.0–15.0)
MCH: 29.7 pg (ref 26.0–34.0)
MCHC: 33.2 g/dL (ref 30.0–36.0)
MCV: 89.3 fL (ref 80.0–100.0)
Platelets: 111 10*3/uL — ABNORMAL LOW (ref 150–400)
RBC: 3.37 MIL/uL — ABNORMAL LOW (ref 3.87–5.11)
RDW: 13.3 % (ref 11.5–15.5)
WBC: 4.7 10*3/uL (ref 4.0–10.5)
nRBC: 0 % (ref 0.0–0.2)

## 2022-11-17 LAB — GLUCOSE, CAPILLARY
Glucose-Capillary: 125 mg/dL — ABNORMAL HIGH (ref 70–99)
Glucose-Capillary: 117 mg/dL — ABNORMAL HIGH (ref 70–99)
Glucose-Capillary: 129 mg/dL — ABNORMAL HIGH (ref 70–99)
Glucose-Capillary: 137 mg/dL — ABNORMAL HIGH (ref 70–99)
Glucose-Capillary: 161 mg/dL — ABNORMAL HIGH (ref 70–99)

## 2022-11-17 LAB — BRAIN NATRIURETIC PEPTIDE: B Natriuretic Peptide: 240.6 pg/mL — ABNORMAL HIGH (ref 0.0–100.0)

## 2022-11-17 MED ORDER — APIXABAN 2.5 MG PO TABS
2.5000 mg | ORAL_TABLET | Freq: Two times a day (BID) | ORAL | Status: DC
  Administered 2022-11-17 – 2022-11-26 (×18): 2.5 mg via ORAL
  Filled 2022-11-17 (×18): qty 1

## 2022-11-17 NOTE — Progress Notes (Signed)
PROGRESS NOTE    [name]  UXN:[id number] DOB: 28-Feb-1934 DOA: 11/11/2022 PCP: [name], FNP   Brief Narrative: 88 year old past medical history significant for CAD, pulmonary hypertension, right subclavian artery stenosis, permanent A-fib, diabetes type 2, hypertension, stage IV low-grade Carcinoid/neuroendocrine presents complaining of worsening shortness of breath on exertion over the last past several months, but worse over the last several days was advised to present to the ED by Dr. Einar Gip. [name] also reports some chocking episodes with meals. CTA negative for PE, moderate right and small left pleural effusion with associated atelectasis, and newly mildly enlarged mediastinal and right hilar lymph node reactive need CT follow-up.  Numerous bilateral solid pulmonary nodules unchanged from 2018.   Assessment & Plan:   Principal Problem:   Hypoxia Active Problems:   Carcinoid tumor of lung   Atrial fibrillation (HCC) CHA2DS2-VASc Score 7   HTN (hypertension)   DM2 (diabetes mellitus, type 2) (HCC)   CAD (coronary artery disease)   Anemia   1-Dyspnea on Exertion:  CTA chest with moderate right pleural effusion.  Received IV lasix times 3 doses.  Pulmonology consulted for evaluation of Amiodarone toxicity ? , as recommended by Dr Einar Gip.  Plan to check for  home oxygen needs.  Continue with nebulizer.  CCM consulted. Dyspnea thought to be multifactorial. pleural effusion, acute on chronic diastolic HF, Low grade lung neuroendocrine tumor.  Started on Pulmicort.  Nocturnal Oxygen sat. Patient desats at Night. Will order home oxygen.  Chest x ray; BL pleural effusion increased, await Pulmonology recommendation.   2-Acute Hypoxic resp failure:  Multifactorial, secondary to pleural effusion, acute on chronic diastolic HF, Low grade lung neuroendocrine tumor.  Started on Pulmicort nebulizer.  Hold IV lasix due to AKI.  Nocturnal home oxygen ordered.  Awaiting  pulmonologist recommendation in regards Chest x ray results.   3-Acute on Chronic Diastolic HF  Presents with dyspnea, pleural effusion, increase BNP.  Hold lasix due to AKI.   AKI; in setting diuretics. Had diarrhea.  Vomiting this am.  Will hold lasix. Received IV fluids.  Cr down to 1.4 Improved.   Nausea, vomiting:  Dysphagia.  KUB; marked gastric distension. Assessment for Small Bowel obstruction is limited  CT abdomen pelvis negative for Obstruction.  Plan for schedule zofran and clear liquid diet. If doesn't help could try Reglan.  Esophagogram ordered. Pending.   H/O CAD; S/P PCI Denies chest pain.   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  On Stiolto-- change to Pulmicort by pulmonologist   GERD; PPI change to BID   Diarrhea;  Acute on chronic. Check GI pathogen, lactoferrin.  Hypomagnesemia; Replaced.  Hypokalemia; Replaced.   A fib; Continue with Cardizem and metoprolol./   Estimated body mass index is 22.05 kg/m as calculated from the following:   Height as of this encounter: 5' 2.01" (1.575 m).   Weight as of this encounter: 54.7 kg.   DVT prophylaxis: Lovenox Code Status: DNR Family Communication: Son over Phone; 11/19 Disposition Plan:  Status is: Inpatient Remains inpatient appropriate because: management of Resp failure    Consultants:  CCM Dr Einar Gip  Procedures:  none  Antimicrobials:    Subjective: No further diarrhea. She tolerates clear liquid diet   Objective: Vitals:   11/16/22 2312 11/17/22 0304 11/17/22 0738 11/17/22 0740  BP: (!) 116/54 120/60 (!) 106/49   Pulse: 90 85    Resp: (!) 25 20    Temp: 97.6 F (36.4 C) 97.9 F (36.6  C) 98.2 F (36.8 C)   TempSrc: Oral  Oral   SpO2: 94% 90%  93%  Weight:      Height:        Intake/Output Summary (Last 24 hours) at 11/17/2022 1136 Last data filed at 11/17/2022 0600 Gross per 24 hour  Intake --  Output 950 ml  Net -950 ml    Filed Weights   11/13/22 0600  Weight:  54.7 kg    Examination:  General exam: NAD Respiratory system: BL air movement.  Cardiovascular system: S 1, S 2 RRR Gastrointestinal system: BS present, soft, nt Central nervous system: Alert, follows command Extremities: No edema   Data Reviewed: I have personally reviewed following labs and imaging studies  CBC: Recent Labs  Lab 11/11/22 1158 11/12/22 2245 11/13/22 0348 11/14/22 0458 11/15/22 0222 11/16/22 0946 11/17/22 0802  WBC 6.4   < > 4.4 5.9 5.6 4.7 4.7  NEUTROABS 4.1  --   --   --   --   --   --   HGB 10.8*   < > 10.0* 10.7* 10.5* 10.1* 10.0*  HCT 32.6*   < > 29.4* 31.0* 31.0* 30.1* 30.1*  MCV 88.8   < > 89.4 86.8 87.6 88.3 89.3  PLT 118*   < > 110* 125* 112* 109* 111*   < > = values in this interval not displayed.    Basic Metabolic Panel: Recent Labs  Lab 11/13/22 0348 11/14/22 0458 11/15/22 0222 11/16/22 0946 11/17/22 0802  NA 134* 137 135 134* 135  K 3.3* 3.9 4.2 4.1 4.4  CL 100 101 100 101 103  CO2 24 26 23 23 24   GLUCOSE 203* 143* 159* 180* 135*  BUN 19 22 23 22 19   CREATININE 1.25* 1.85* 1.70* 1.65* 1.47*  CALCIUM 8.4* 8.8* 8.8* 8.5* 8.5*  MG 1.2* 1.7  --   --   --     GFR: Estimated Creatinine Clearance: 21.7 mL/min (A) (by C-G formula based on SCr of 1.47 mg/dL (H)). Liver Function Tests: No results for input(s): "AST", "ALT", "ALKPHOS", "BILITOT", "PROT", "ALBUMIN" in the last 168 hours. No results for input(s): "LIPASE", "AMYLASE" in the last 168 hours. No results for input(s): "AMMONIA" in the last 168 hours. Coagulation Profile: No results for input(s): "INR", "PROTIME" in the last 168 hours. Cardiac Enzymes: No results for input(s): "CKTOTAL", "CKMB", "CKMBINDEX", "TROPONINI" in the last 168 hours. BNP (last 3 results) No results for input(s): "PROBNP" in the last 8760 hours. HbA1C: No results for input(s): "HGBA1C" in the last 72 hours.  CBG: Recent Labs  Lab 11/16/22 0833 11/16/22 1207 11/16/22 1530 11/16/22 2155  11/17/22 0741  GLUCAP 171* 100* 158* 168* 125*    Lipid Profile: No results for input(s): "CHOL", "HDL", "LDLCALC", "TRIG", "CHOLHDL", "LDLDIRECT" in the last 72 hours. Thyroid Function Tests: No results for input(s): "TSH", "T4TOTAL", "FREET4", "T3FREE", "THYROIDAB" in the last 72 hours. Anemia Panel: No results for input(s): "VITAMINB12", "FOLATE", "FERRITIN", "TIBC", "IRON", "RETICCTPCT" in the last 72 hours. Sepsis Labs: Recent Labs  Lab 11/12/22 2245  PROCALCITON 0.12     Recent Results (from the past 240 hour(s))  Resp Panel by RT-PCR (Flu A&B, Covid) Anterior Nasal Swab     Status: None   Collection Time: 11/12/22  1:44 PM   Specimen: Anterior Nasal Swab  Result Value Ref Range Status   SARS Coronavirus 2 by RT PCR NEGATIVE NEGATIVE Final    Comment: (NOTE) SARS-CoV-2 target nucleic acids are NOT DETECTED.  The  SARS-CoV-2 RNA is generally detectable in upper respiratory specimens during the acute phase of infection. The lowest concentration of SARS-CoV-2 viral copies this assay can detect is 138 copies/mL. A negative result does not preclude SARS-Cov-2 infection and should not be used as the sole basis for treatment or other patient management decisions. A negative result may occur with  improper specimen collection/handling, submission of specimen other than nasopharyngeal swab, presence of viral mutation(s) within the areas targeted by this assay, and inadequate number of viral copies(<138 copies/mL). A negative result must be combined with clinical observations, patient history, and epidemiological information. The expected result is Negative.  Fact Sheet for Patients:  EntrepreneurPulse.com.au  Fact Sheet for Healthcare Providers:  IncredibleEmployment.be  This test is no t yet approved or cleared by the [place] FDA and  has been authorized for detection and/or diagnosis of SARS-CoV-2 by FDA under an Emergency Use  Authorization (EUA). This EUA will remain  in effect (meaning this test can be used) for the duration of the COVID-19 declaration under Section 564(b)(1) of the Act, 21 U.S.C.section 360bbb-3(b)(1), unless the authorization is terminated  or revoked sooner.       Influenza A by PCR NEGATIVE NEGATIVE Final   Influenza B by PCR NEGATIVE NEGATIVE Final    Comment: (NOTE) The Xpert Xpress SARS-CoV-2/FLU/RSV plus assay is intended as an aid in the diagnosis of influenza from Nasopharyngeal swab specimens and should not be used as a sole basis for treatment. Nasal washings and aspirates are unacceptable for Xpert Xpress SARS-CoV-2/FLU/RSV testing.  Fact Sheet for Patients: EntrepreneurPulse.com.au  Fact Sheet for Healthcare Providers: IncredibleEmployment.be  This test is not yet approved or cleared by the [place] FDA and has been authorized for detection and/or diagnosis of SARS-CoV-2 by FDA under an Emergency Use Authorization (EUA). This EUA will remain in effect (meaning this test can be used) for the duration of the COVID-19 declaration under Section 564(b)(1) of the Act, 21 U.S.C. section 360bbb-3(b)(1), unless the authorization is terminated or revoked.  Performed at [facility], [place] [address], [place], [place] [address]          Radiology Studies: DG CHEST PORT 1 VIEW  Result Date: 11/16/2022 CLINICAL DATA:  Acute respiratory failure EXAM: PORTABLE CHEST 1 VIEW COMPARISON:  COMPARISON 11/11/2022 FINDINGS: Normal cardiac silhouette. Bilateral pleural effusions increased from prior. There is increased central venous congestion interstitial edema. Platelike atelectasis in the mid lungs. IMPRESSION: Increasing pleural effusions and interstitial edema Increased atelectasis in the mid lungs. Electronically Signed   By: Suzy Bouchard M.D.   On: 11/16/2022 11:06        Scheduled Meds:  budesonide (PULMICORT) nebulizer  solution  0.5 mg Nebulization BID   diltiazem  180 mg Oral Daily   enoxaparin (LOVENOX) injection  30 mg Subcutaneous Q24H   insulin aspart  0-9 Units Subcutaneous TID WC   ipratropium  0.5 mg Nebulization BID   levalbuterol  1.25 mg Nebulization BID   levothyroxine  88 mcg Oral Q0600   metoprolol succinate  50 mg Oral Daily   ondansetron (ZOFRAN) IV  4 mg Intravenous Q8H   pantoprazole (PROTONIX) IV  40 mg Intravenous Q12H   rosuvastatin  20 mg Oral Daily   Investigational - Study Medication  1 tablet Oral QPM   Investigational - Study Medication  1 tablet Oral q morning   Investigational - Study Medication  1 tablet Oral q morning   Continuous Infusions:   LOS: 4 days  Time spent: 35 minutes    Crews Mccollam A Tyrell Antonio, MD [facility]   If 7PM-7AM, please contact night-coverage [email or web address]  11/17/2022, 11:36 AM

## 2022-11-17 NOTE — Progress Notes (Addendum)
Physical Therapy Treatment Patient Details Name: [name] MRN: [id number] DOB: 1934/10/30 Today's Date: 11/17/2022   History of Present Illness Pt is 88 year old presented to [facility] on  11/11/22 with SOB. Work up in progress to determine etiology. Pt developed afib with RVR in ED. Pt with nausea/vomiting likely due to esophageal dysmotility. PMH - CAD, pulmonary htn, afib, dm, htn, lung CA, stroke.    PT Comments    Pt continues to have limited functional activity tolerance due to high HR and fatigue. Moves fairly well but fatigues quickly. Son plans to come stay with pt and provide assist as needed.    Recommendations for follow up therapy are one component of a multi-disciplinary discharge planning process, led by the attending physician.  Recommendations may be updated based on patient status, additional functional criteria and insurance authorization.  Follow Up Recommendations  Home health PT     Assistance Recommended at Discharge Frequent or constant Supervision/Assistance  Patient can return home with the following Help with stairs or ramp for entrance;Assistance with cooking/housework;A little help with walking and/or transfers;A little help with bathing/dressing/bathroom   Equipment Recommendations  None recommended by PT    Recommendations for Other Services       Precautions / Restrictions Precautions Precautions: Fall;Other (comment) Precaution Comments: O2 sats and HR Restrictions Weight Bearing Restrictions: No     Mobility  Bed Mobility Overal bed mobility: Needs Assistance Bed Mobility: Supine to Sit, Sit to Supine     Supine to sit: Min assist, HOB elevated Sit to supine: Min guard   General bed mobility comments: Assist to pull trunk up into sitting    Transfers Overall transfer level: Needs assistance Equipment used: Rolling walker (2 wheels) Transfers: Sit to/from Stand Sit to Stand: Min guard, Min assist           General transfer  comment: Min guard for safety to stand from bed. Min assist to bring hips up from low commode.    Ambulation/Gait Ambulation/Gait assistance: Min guard Gait Distance (Feet): 70 Feet Assistive device: Rolling walker (2 wheels) Gait Pattern/deviations: Step-through pattern, Decreased stride length, Trunk flexed Gait velocity: decr Gait velocity interpretation: <1.31 ft/sec, indicative of household ambulator   General Gait Details: Assist for Therapist, music    Modified Rankin (Stroke Patients Only)       Balance Overall balance assessment: Needs assistance Sitting-balance support: Feet supported, No upper extremity supported Sitting balance-Leahy Scale: Good     Standing balance support: During functional activity, Reliant on assistive device for balance, Bilateral upper extremity supported Standing balance-Leahy Scale: Poor Standing balance comment: UE support and Min guard assist                            Cognition Arousal/Alertness: Awake/alert Behavior During Therapy: WFL for tasks assessed/performed Overall Cognitive Status: Within Functional Limits for tasks assessed                                          Exercises      General Comments General comments (skin integrity, edema, etc.): HR to 150 with activity. SpO2 86% on RA. Amb on 2L with SpO2 94%.      Pertinent Vitals/Pain Pain Assessment Pain Assessment: Faces Faces  Pain Scale: Hurts a little bit Pain Location: back Pain Descriptors / Indicators: Discomfort Pain Intervention(s): Repositioned, Limited activity within patient's tolerance    Home Living                          Prior Function            PT Goals (current goals can now be found in the care plan section) Progress towards PT goals: Progressing toward goals    Frequency    Min 3X/week      PT Plan Current plan remains appropriate     Co-evaluation              AM-PAC PT "6 Clicks" Mobility   Outcome Measure  Help needed turning from your back to your side while in a flat bed without using bedrails?: A Little Help needed moving from lying on your back to sitting on the side of a flat bed without using bedrails?: A Little Help needed moving to and from a bed to a chair (including a wheelchair)?: A Little Help needed standing up from a chair using your arms (e.g., wheelchair or bedside chair)?: A Little Help needed to walk in hospital room?: A Little Help needed climbing 3-5 steps with a railing? : A Lot 6 Click Score: 17    End of Session Equipment Utilized During Treatment: Oxygen Activity Tolerance: Treatment limited secondary to medical complications (Comment);Patient limited by fatigue (high HR) Patient left: with call bell/phone within reach;in bed;with bed alarm set Nurse Communication: Mobility status PT Visit Diagnosis: Unsteadiness on feet (R26.81);Muscle weakness (generalized) (M62.81)     Time: 4580-9983 PT Time Calculation (min) (ACUTE ONLY): 34 min  Charges:  $Gait Training: 23-37 mins                     [place] Office [place] 11/17/2022, 2:57 PM

## 2022-11-17 NOTE — Progress Notes (Signed)
[facility] Gastroenterology Progress Note  [name] 88 y.o. [birth year]  CC: Nausea and vomiting   Subjective: Patient states she had clear liquid diet last night and tolerated it without difficulty.  No nausea or vomiting this morning.  Denies pain.  ROS : Review of Systems  Constitutional:  Negative for chills, fever and weight loss.  Gastrointestinal:  Negative for abdominal pain, blood in stool, constipation, diarrhea, heartburn, melena, nausea and vomiting.      Objective: Vital signs in last 24 hours: Vitals:   11/17/22 0738 11/17/22 0740  BP: (!) 106/49   Pulse:    Resp:    Temp: 98.2 F (36.8 C)   SpO2:  93%    Physical Exam:  General:  Frail elderly female  Head:  Normocephalic, without obvious abnormality, atraumatic  Eyes:  Anicteric sclera, EOM's intact  Lungs:   Clear to auscultation bilaterally, respirations unlabored  Heart:  Regular rate and rhythm, S1, S2 normal  Abdomen:   Soft, non-tender, bowel sounds active all four quadrants,  no masses,     Lab Results: Recent Labs    11/16/22 0946 11/17/22 0802  NA 134* 135  K 4.1 4.4  CL 101 103  CO2 23 24  GLUCOSE 180* 135*  BUN 22 19  CREATININE 1.65* 1.47*  CALCIUM 8.5* 8.5*   No results for input(s): "AST", "ALT", "ALKPHOS", "BILITOT", "PROT", "ALBUMIN" in the last 72 hours. Recent Labs    11/16/22 0946 11/17/22 0802  WBC 4.7 4.7  HGB 10.1* 10.0*  HCT 30.1* 30.1*  MCV 88.3 89.3  PLT 109* 111*   No results for input(s): "LABPROT", "INR" in the last 72 hours.    Assessment Nausea and vomiting -CT shows moderate distention on stomach.  Artifact versus motility (gastroparesis) issue versus partial gastric outlet obstruction  Hypoxic respiratory failure  Neuroendocrine lung cancer   Plan: Currently tolerating clear liquids without difficulty Continue Zofran on a scheduled basis When patient is ready can try to advance diet.  If unable to tolerate advance diet may consider adding  [facility] will follow  Garnette Scheuermann PA-C 11/17/2022, 9:33 AM  Contact #  [phone]

## 2022-11-17 NOTE — Progress Notes (Signed)
SATURATION QUALIFICATIONS: (This note is used to comply with regulatory documentation for home oxygen)  Patient Saturations on Room Air at Rest = 86%  Patient Saturations on Room Air while Ambulating = Not tested due to decr SpO2 at rest on RA  Patient Saturations on 2 Liters of oxygen while Ambulating = 94%  Please briefly explain why patient needs home oxygen:Unable to maintain adequate level oxygenation at rest or with activity without supplemental O2.  [place] Office [phone]

## 2022-11-17 NOTE — Progress Notes (Signed)
NAME:  [name], MRN:  [id number], DOB:  06-10-1934, LOS: 4 ADMISSION DATE:  11/11/2022, CONSULTATION DATE: 11/13/2022 REFERRING MD: Triad, CHIEF COMPLAINT: Exertional dyspnea for over 2 years  History of Present Illness:  88 year old female who presented to [facility] with reports of exertional dyspnea.    She has a longstanding hx of dyspnea dating back to at least 2013 with recalcitrant atrial fibrillation managed with low dose amiodarone.  Hx of passive smoke exposure, never smoker. She was diagnosed with endocrine lung carcinoid in 2004, followed by [facility], not on any therapy such as somatostatin analogs and never has been.  She was on oxygen until 6 years ago at which time she is quit due to the cost.  She has had intermittent dyspnea on exertion for over 2 years and presents with acute episodes and is admitted for further evaluation and treatment.  She had PFT's performed in 03/01/2022 which showed a FEV1 70%.  She denies fevers chills sweats or purulent sputum.  He does have an intermittent cough.  No reported lower extremity edema.  CT of the chest was negative for pulmonary embolism but did show bilateral pulm effusions.  Pulmonary critical care asked to evaluate for exertional dyspnea and for amiodarone toxicity although no record of her being on amiodarone in the last several months.  Pertinent  Medical History  HTN  Atrial Fibrillation  CVA  CAD  Syncope & Collapse  Hypothyroidism  Chronic Dyspnea  Lung Cancer - endocrine lung carcinoid, dx [place] [address] by biopsy at [facility]  Anemia  DM II  [place]  Significant Hospital Events: Including procedures, antibiotic start and stop dates in addition to other pertinent events   11/14 Admit with SOB 11/16 PCCM consulted for evaluation of amiodarone lung toxicity, dyspnea. Diuresed with 40 mg lasix 11/17 Bump in Sr Cr. CT ABD/Pelvis > no bowel obstruction, fluid filled small and large bowel, colonic diverticulosis without diverticulitis,  moderate right and small left pleural effusions unchanged  11/20 now on oxygen 2L  Interim History / Subjective:   Now requiring 2L oxygen. C/o back pain.  Esophagram this morning showing severe esophageal motility.   Objective   Blood pressure (!) 106/49, pulse 85, temperature 98.2 F (36.8 C), temperature source Oral, resp. rate 20, height 5' 2.01" (1.575 m), weight 54.7 kg, SpO2 93 %.        Intake/Output Summary (Last 24 hours) at 11/17/2022 1401 Last data filed at 11/17/2022 0600 Gross per 24 hour  Intake --  Output 950 ml  Net -950 ml    Filed Weights   11/13/22 0600  Weight: 54.7 kg    Exam: General: Frail elderly female in NAD HEENT: [place]/AT, PERRL, mild JVP elevation Neuro: Alert, oriented, non-focal CV: RRR, no MRG PULM: unlabored at rest. Clear bilateral breath sounds. Diminished in bases.  GI: Soft, non-tender, non-distended Extremities: No acute deformity or ROM limitation.  Skin: grossly intact.   Resolved Hospital Problem list     Assessment & Plan:   Chronic Dyspnea on Exertion  Concern for Recurrent Aspiration  Neuroendocrine Lung Cancer Prior Oxygen Use  Suspect dyspnea multifactorial in setting of AF, AonC HFpEF, moderate AS, Low grade neuroendocrine tumor.  Was previously on home O2 but stopped due to cost.   Imaging consistent with volume overload (bilateral effusion and pulmonary edema), but echo doesn't agree. Given her frailty and GI issues I'm wondering if her effusions are not the result of third spacing for nutritional reasons. She may be aspiration  based on her recent nausea and esophageal dysmotility noted on 11/20, but with a normal white count and absence of fever, infectious etiology is felt to be much less likely.   -Seems like limited benefit from nebs, reasonable to stop -Check nutritional labs. Albumin, Prealbumin, CRP. This is of course complicated by ongoing GI issues.  -Consult to dietician -Consider re-trial of diuresis if  worsens. - OOB for 6-8 hours per day as tolerated.   - Incentive spirometry -Without fever and a normal white-count, thoracentesis likely to be low yield. Can keep on the back burner. Would likely only provide temporary relief. -Consider palliative care evaluation.  Severe esophageal dysmotility GERD CT ABD/pelvis results noted    - GI following - Rec clear liquids for today then trial of full liquid diet.   CAD  -per Cardiology    Best Practice (right click and "Reselect all SmartList Selections" daily)  Per primary / [place]  Code Status: DNR      Georgann Housekeeper, AGACNP-BC [place] for personal pager PCCM on call pager [phone] until 7pm. Please call Elink 7p-7a. [phone]  11/17/2022 2:46 PM

## 2022-11-17 NOTE — TOC Progression Note (Signed)
Transition of Care [facility]) - Progression Note    Patient Details  Name: [name] MRN: [id number] Date of Birth: 1934-11-07  Transition of Care [facility]) CM/SW [place], RN Phone Number: 11/17/2022, 1:20 PM  Clinical Narrative:    Orders for home nocturnal 02. Patient agreeable to use in house provider adapt for home 02 needs. Notified [place] with adapt of order. Home 02 will be delivered to the room.  TOC will continue to follow for needs.   Expected Discharge Plan: [place] Barriers to Discharge: Continued Medical Work up  Expected Discharge Plan and Services Expected Discharge Plan: [place]   Discharge Planning Services: CM Consult Post Acute Care Choice: [place] arrangements for the past 2 months: Single Family Home                 DME Arranged: Oxygen DME Agency: [employer] Date DME Agency Contacted: 11/17/22 Time DME Agency Contacted: [id number] Representative spoke with at DME Agency: [place]: PT [place]: [place] ([place]) Date [place]: 11/14/22 Time [place]: 1448 Representative spoke with at [place]: [place] ([place]) Interventions    Readmission Risk Interventions     No data to display

## 2022-11-18 DIAGNOSIS — E43 Unspecified severe protein-calorie malnutrition: Secondary | ICD-10-CM | POA: Insufficient documentation

## 2022-11-18 DIAGNOSIS — R0902 Hypoxemia: Secondary | ICD-10-CM | POA: Diagnosis not present

## 2022-11-18 LAB — ALBUMIN: Albumin: 3 g/dL — ABNORMAL LOW (ref 3.5–5.0)

## 2022-11-18 LAB — BASIC METABOLIC PANEL
Anion gap: 10 (ref 5–15)
BUN: 15 mg/dL (ref 8–23)
CO2: 25 mmol/L (ref 22–32)
Calcium: 8.7 mg/dL — ABNORMAL LOW (ref 8.9–10.3)
Chloride: 101 mmol/L (ref 98–111)
Creatinine, Ser: 1.29 mg/dL — ABNORMAL HIGH (ref 0.44–1.00)
GFR, Estimated: 40 mL/min — ABNORMAL LOW (ref >60)
Glucose, Bld: 139 mg/dL — ABNORMAL HIGH (ref 70–99)
Potassium: 4.3 mmol/L (ref 3.5–5.1)
Sodium: 136 mmol/L (ref 135–145)

## 2022-11-18 LAB — CBC
HCT: 29.9 % — ABNORMAL LOW (ref 36.0–46.0)
Hemoglobin: 9.9 g/dL — ABNORMAL LOW (ref 12.0–15.0)
MCH: 29.4 pg (ref 26.0–34.0)
MCHC: 33.1 g/dL (ref 30.0–36.0)
MCV: 88.7 fL (ref 80.0–100.0)
Platelets: 120 10*3/uL — ABNORMAL LOW (ref 150–400)
RBC: 3.37 MIL/uL — ABNORMAL LOW (ref 3.87–5.11)
RDW: 13.3 % (ref 11.5–15.5)
WBC: 4.4 10*3/uL (ref 4.0–10.5)
nRBC: 0 % (ref 0.0–0.2)

## 2022-11-18 LAB — PREALBUMIN: Prealbumin: 14 mg/dL — ABNORMAL LOW (ref 18–38)

## 2022-11-18 LAB — GLUCOSE, CAPILLARY
Glucose-Capillary: 131 mg/dL — ABNORMAL HIGH (ref 70–99)
Glucose-Capillary: 131 mg/dL — ABNORMAL HIGH (ref 70–99)
Glucose-Capillary: 144 mg/dL — ABNORMAL HIGH (ref 70–99)
Glucose-Capillary: 162 mg/dL — ABNORMAL HIGH (ref 70–99)
Glucose-Capillary: 198 mg/dL — ABNORMAL HIGH (ref 70–99)

## 2022-11-18 LAB — C-REACTIVE PROTEIN: CRP: 4.6 mg/dL — ABNORMAL HIGH (ref <1.0)

## 2022-11-18 MED ORDER — ADULT MULTIVITAMIN W/MINERALS CH
1.0000 | ORAL_TABLET | Freq: Every day | ORAL | Status: DC
  Administered 2022-11-18 – 2022-11-26 (×9): 1 via ORAL
  Filled 2022-11-18 (×9): qty 1

## 2022-11-18 MED ORDER — FUROSEMIDE 20 MG PO TABS
20.0000 mg | ORAL_TABLET | Freq: Every day | ORAL | Status: DC
  Administered 2022-11-18 – 2022-11-22 (×5): 20 mg via ORAL
  Filled 2022-11-18 (×5): qty 1

## 2022-11-18 MED ORDER — ENSURE ENLIVE PO LIQD
237.0000 mL | Freq: Two times a day (BID) | ORAL | Status: DC
  Administered 2022-11-18 – 2022-11-26 (×14): 237 mL via ORAL

## 2022-11-18 NOTE — Progress Notes (Signed)
Initial Nutrition Assessment  DOCUMENTATION CODES:  Severe malnutrition in context of chronic illness  INTERVENTION:  -Advance diet as clinically appropriate -Provide Ensure Enlive BID (350kcal, 20g protein) -Provide daily MVI  NUTRITION DIAGNOSIS:  Severe Malnutrition related to chronic illness (esophageal dysmotility) as evidenced by severe fat depletion, severe muscle depletion, energy intake < 75% for > or equal to 1 month.  GOAL:  Patient will meet greater than or equal to 90% of their needs  MONITOR:  PO intake, Supplement acceptance, Diet advancement  REASON FOR ASSESSMENT:  Consult Poor PO, Assessment of nutrition requirement/status  ASSESSMENT:  Pt is an 88yo F with PMH of stroke, T2DM, CAD, hypothyroid, HTN, anemia, diverticulitis, and lung cancer who presents with n/v found to have severe dysmotility.  Visited pt at bedside this afternoon. She reports having the feeling that food is stuck in her chest for about 3 years now. In the 3 year period, pt reports a 50# weight loss. Reviewed pt's diet recall which includes 1 Boost/day, cheeze-its, pop tarts, popcorn, cheese balls and candy bars. Pt has been meeting <75% estimated needs for >1 month. NFPE shows severe muscle wasting and severe fat loss. Pt reports hair thinning and loss of skin integrity. Pt meets ASPEN criteria for severe protein calorie malnutrition.  Currently on a full liquid diet with adequate tolerance. Offered Ensure supplements, pt agreeable. Recommend Ensure Enlive BID to provide 350kcal and 20g protein/bottle. Also recommend daily MVI.   Medications reviewed and include: eliquis, cardizem, novolog ssi w/ meals, synthroid, zofran, protonix, crestor, prn tylenol  Labs reviewed: BG:117-144, Cr:1.29, Corrected Ca:9.1 (Ca:8.7, albumin:3.0), CRP:4.6, GFR:40, A1c:5.8  NUTRITION - FOCUSED PHYSICAL EXAM:  Flowsheet Row Most Recent Value  Orbital Region Moderate depletion  Upper Arm Region Severe depletion   Thoracic and Lumbar Region Severe depletion  Buccal Region Mild depletion  Temple Region Moderate depletion  Clavicle Bone Region Severe depletion  Clavicle and Acromion Bone Region Severe depletion  Scapular Bone Region Unable to assess  Dorsal Hand Severe depletion  Patellar Region Severe depletion  Anterior Thigh Region Severe depletion  Posterior Calf Region Severe depletion  Hair Reviewed  Charma Igo thinning]  Eyes Reviewed  Mouth Reviewed  Skin Reviewed  Nails Reviewed       Diet Order:   Diet Order             Diet full liquid Room service appropriate? No; Fluid consistency: Thin  Diet effective now                   EDUCATION NEEDS:  Not appropriate for education at this time  Skin:  Skin Assessment: Reviewed RN Assessment  Last BM:  11/18  Height:  Ht Readings from Last 1 Encounters:  11/13/22 5' 2.01" (1.575 m)    Weight:  Wt Readings from Last 1 Encounters:  11/13/22 54.7 kg   BMI:  Body mass index is 22.05 kg/m.  Estimated Nutritional Needs:  Kcal:  1640-1915kcal Protein:  65-85g Fluid:  1640-1965mL  Candise Bowens, MS, RD, LDN, CNSC See AMiON for contact information

## 2022-11-18 NOTE — Progress Notes (Signed)
[facility] Gastroenterology Progress Note  [name] 88 y.o. 03/09/1934  CC:  Nausea and vomiting   Subjective: Patient has been tolerating clear liquids without difficulty. Denies nausea and vomiting. Denies abdominal pain. Reports continued back pain.  ROS : Review of Systems  Constitutional:  Negative for chills, fever and weight loss.  Gastrointestinal:  Negative for abdominal pain, blood in stool, constipation, diarrhea, heartburn, melena, nausea and vomiting.      Objective: Vital signs in last 24 hours: Vitals:   11/18/22 0806 11/18/22 0808  BP:    Pulse:    Resp:    Temp:    SpO2: 96% 95%    Physical Exam:  General:  Frail, elderly female  Head:  Normocephalic, without obvious abnormality, atraumatic  Eyes:  Anicteric sclera, EOM's intact  Lungs:   Clear to auscultation bilaterally, respirations unlabored  Heart:  Regular rate and rhythm, S1, S2 normal  Abdomen:   Soft, non-tender, bowel sounds active all four quadrants,  no masses,     Lab Results: Recent Labs    11/17/22 0802 11/18/22 0326  NA 135 136  K 4.4 4.3  CL 103 101  CO2 24 25  GLUCOSE 135* 139*  BUN 19 15  CREATININE 1.47* 1.29*  CALCIUM 8.5* 8.7*   Recent Labs    11/17/22 0802 11/18/22 0326  AST 25  --   ALT 17  --   ALKPHOS 47  --   BILITOT 0.6  --   PROT 5.9*  --   ALBUMIN 3.0* 3.0*   Recent Labs    11/17/22 0802 11/18/22 0326  WBC 4.7 4.4  HGB 10.0* 9.9*  HCT 30.1* 29.9*  MCV 89.3 88.7  PLT 111* 120*   No results for input(s): "LABPROT", "INR" in the last 72 hours.    Assessment Nausea and vomiting -CT shows moderate distention on stomach.  Artifact versus motility (gastroparesis) issue versus partial gastric outlet obstruction - esophagram: severe dysmotility.   Hypoxic respiratory failure   Neuroendocrine lung cancer   Plan: Tolerating clears without difficulty. Recommend advancing to full liquids as tolerated. If trouble with full liquids, can back down  to clears. Educated patient on slow eating with adequate mastication and small bites with recent esophagram showing severe dysmotility. [facility] will follow  Garnette Scheuermann PA-C 11/18/2022, 9:33 AM  Contact #  [phone]

## 2022-11-18 NOTE — Progress Notes (Signed)
NAME:  [name], MRN:  [id number], DOB:  Feb 09, 1934, LOS: 5 ADMISSION DATE:  11/11/2022, CONSULTATION DATE: 11/13/2022 REFERRING MD: Triad, CHIEF COMPLAINT: Exertional dyspnea for over 2 years  History of Present Illness:  88 year old female who presented to [facility] with reports of exertional dyspnea.    She has a longstanding hx of dyspnea dating back to at least 2013 with recalcitrant atrial fibrillation managed with low dose amiodarone.  Hx of passive smoke exposure, never smoker. She was diagnosed with endocrine lung carcinoid in 2004, followed by [facility], not on any therapy such as somatostatin analogs and never has been.  She was on oxygen until 6 years ago at which time she is quit due to the cost.  She has had intermittent dyspnea on exertion for over 2 years and presents with acute episodes and is admitted for further evaluation and treatment.  She had PFT's performed in 03/01/2022 which showed a FEV1 70%.  She denies fevers chills sweats or purulent sputum.  He does have an intermittent cough.  No reported lower extremity edema.  CT of the chest was negative for pulmonary embolism but did show bilateral pulm effusions.  Pulmonary critical care asked to evaluate for exertional dyspnea and for amiodarone toxicity although no record of her being on amiodarone in the last several months.  Pertinent  Medical History  HTN  Atrial Fibrillation  CVA  CAD  Syncope & Collapse  Hypothyroidism  Chronic Dyspnea  Lung Cancer - endocrine lung carcinoid, dx [place] [address] by biopsy at [facility]  Anemia  DM II  [place]  Significant Hospital Events: Including procedures, antibiotic start and stop dates in addition to other pertinent events   11/14 Admit with SOB 11/16 PCCM consulted for evaluation of amiodarone lung toxicity, dyspnea. Diuresed with 40 mg lasix 11/17 Bump in Sr Cr. CT ABD/Pelvis > no bowel obstruction, fluid filled small and large bowel, colonic diverticulosis without diverticulitis,  moderate right and small left pleural effusions unchanged  11/20 now on oxygen 2L  Interim History / Subjective:   No complaints On 2L with sats 98% Resting in bed  Objective   Blood pressure 114/61, pulse 82, temperature (!) 97.4 F (36.3 C), temperature source Oral, resp. rate (!) 22, height 5' 2.01" (1.575 m), weight 54.7 kg, SpO2 95 %.        Intake/Output Summary (Last 24 hours) at 11/18/2022 1334 Last data filed at 11/18/2022 1259 Gross per 24 hour  Intake --  Output 400 ml  Net -400 ml    Filed Weights   11/13/22 0600  Weight: 54.7 kg    Exam: General: Frail elderly female on vent HEENT: [place]/AT, PERRL, no JVD Neuro: Alert, oriented, non-focal CV: RRR, no MRG PULM: Unlabored. Diminished bases.  GI: Soft, non-tender, non-distended Extremities: No acute deformity or ROM limitation.  Skin: grossly intact. No edema.  Resolved Hospital Problem list     Assessment & Plan:   Chronic Dyspnea on Exertion  Concern for Recurrent Aspiration  Neuroendocrine Lung Cancer Prior Oxygen Use  Suspect dyspnea multifactorial in setting of AF, AonC HFpEF, moderate AS, Low grade neuroendocrine tumor.  Was previously on home O2 but stopped due to cost.   Imaging consistent with volume overload (bilateral effusion and pulmonary edema), but echo doesn't agree. Given her frailty and GI issues I'm wondering if her effusions are not the result of third spacing for nutritional reasons. She may be aspiration based on her recent nausea and esophageal dysmotility noted on 11/20, but  with a normal white count and absence of fever, infectious etiology is felt to be much less likely.   -Seems like limited benefit from nebs, reasonable to stop - Awaiting dietician consult to assess nutritional status -Consider re-trial of diuresis if worsens. - OOB for 6-8 hours per day as tolerated.   - Incentive spirometry -Without fever and a normal white-count, thoracentesis likely to be low yield. Can  keep on the back burner. Would likely only provide temporary relief, of minimal symptoms. -Consider palliative care evaluation.  Severe esophageal dysmotility GERD CT ABD/pelvis results noted    - GI following - Rec clear liquids for today then trial of full liquid diet.   CAD  -per Cardiology    Best Practice (right click and "Reselect all SmartList Selections" daily)  Per primary / [place]  Code Status: DNR      Georgann Housekeeper, AGACNP-BC [facility]  See [place] for personal pager PCCM on call pager [phone] until 7pm. Please call Elink 7p-7a. [phone]  11/18/2022 1:34 PM

## 2022-11-18 NOTE — TOC Progression Note (Addendum)
Transition of Care [facility]) - Progression Note    Patient Details  Name: [name] MRN: [id number] Date of Birth: 1934/10/04  Transition of Care [facility]) CM/SW Contact  Graves-Bigelow, Ocie Cornfield, RN Phone Number: 11/18/2022, 2:48 PM  Clinical Narrative: Case Manager received a call regarding oxygen from the provider. Case Manager called [employer]- left voicemail. Per Medicare guidelines patient has to leave with a portable e tank. Case Manager did call son [name]- voicemail was full; unable to leave message.   11-18-22 Case Manager received a call from [place] with [employer]. [employer] will follow for oxygen needs for this patient.    Expected Discharge Plan: [place] Barriers to Discharge: Continued Medical Work up  Expected Discharge Plan and Services Expected Discharge Plan: [place]   Discharge Planning Services: CM Consult Post Acute Care Choice: [place] arrangements for the past 2 months: Single Family Home                 DME Arranged: Oxygen DME Agency: [employer] Date DME Agency Contacted: 11/17/22 Time DME Agency Contacted: 1914 Representative spoke with at DME Agency: [place]: PT [place]: [place] ([place]) Date [place]: 11/14/22 Time [place]: 1448 Representative spoke with at [place]: Caryl Pina  Readmission Risk Interventions     No data to display

## 2022-11-18 NOTE — Progress Notes (Addendum)
Physical Therapy Treatment Patient Details Name: [name] MRN: [id number] DOB: Jan 08, 1934 Today's Date: 11/18/2022   History of Present Illness Pt is 88 year old presented to [facility] on  11/11/22 with SOB. Pt developed afib with RVR in ED. Pt with nausea/vomiting likely due to esophageal dysmotility. 11/21 Pulmonary consulted, dyspnea, hypoxia multifactorial. PMH - CAD, pulmonary htn, afib, dm, htn, lung CA, stroke.    PT Comments    Improved tolerance with ambulation, HR avg 100 today, SpO2 93% on 2L ambulating, 98% 2L at rest. Navigates 70 feet with close guard for safety. Educated on safe AD use. Tolerated exercises well. Remains motivated and eager to work with therapy so she can return home safely. Patient will continue to benefit from skilled physical therapy services to further improve independence with functional mobility.    Recommendations for follow up therapy are one component of a multi-disciplinary discharge planning process, led by the attending physician.  Recommendations may be updated based on patient status, additional functional criteria and insurance authorization.  Follow Up Recommendations  Home health PT     Assistance Recommended at Discharge Frequent or constant Supervision/Assistance  Patient can return home with the following Help with stairs or ramp for entrance;Assistance with cooking/housework;A little help with walking and/or transfers;A little help with bathing/dressing/bathroom   Equipment Recommendations  None recommended by PT    Recommendations for Other Services       Precautions / Restrictions Precautions Precautions: Fall;Other (comment) Precaution Comments: O2 sats and HR Restrictions Weight Bearing Restrictions: No     Mobility  Bed Mobility Overal bed mobility: Needs Assistance Bed Mobility: Supine to Sit, Sit to Supine     Supine to sit: Min assist, HOB elevated Sit to supine: Min guard   General bed mobility comments:  Assist to pull trunk up into sitting, able to return to bed without physical assist although effortful.    Transfers Overall transfer level: Needs assistance Equipment used: Rolling walker (2 wheels) Transfers: Sit to/from Stand Sit to Stand: Min assist           General transfer comment: min assist for boost and balance to rise from low bed setting. Cues for technique. Steady with use of RW upon standing.    Ambulation/Gait Ambulation/Gait assistance: Min guard Gait Distance (Feet): 70 Feet Assistive device: Rolling walker (2 wheels) Gait Pattern/deviations: Step-through pattern, Decreased stride length, Trunk flexed Gait velocity: decr Gait velocity interpretation: <1.31 ft/sec, indicative of household ambulator   General Gait Details: Close guard for safety. Minor instability noted with turns, cued to not pick up RW for improved control. No buckling noted. SpO2 maintained 91-93% on 2L supplemental O2 throughout. HR 100.   Stairs             Wheelchair Mobility    Modified Rankin (Stroke Patients Only)       Balance Overall balance assessment: Needs assistance Sitting-balance support: Feet supported, No upper extremity supported Sitting balance-Leahy Scale: Good     Standing balance support: During functional activity, Reliant on assistive device for balance, Bilateral upper extremity supported Standing balance-Leahy Scale: Poor Standing balance comment: UE support and Min guard assist                            Cognition Arousal/Alertness: Awake/alert Behavior During Therapy: WFL for tasks assessed/performed Overall Cognitive Status: Within Functional Limits for tasks assessed  Exercises    11/18/22 1500  General Exercises - Lower Extremity  Hip Flexion/Marching Strengthening;Both;10 reps;Standing  Toe Raises Strengthening;Both;10 reps;Standing  Heel Raises Strengthening;Both;10  reps;Standing  Mini-Sqauts Strengthening;Both;10 reps;Standing  Ankle Circles/Pumps AROM;Both;10 reps;Seated  Long [employer] Strengthening;Both;10 reps;Seated       General Comments General comments (skin integrity, edema, etc.): HR around 100 today.      Pertinent Vitals/Pain Pain Assessment Pain Intervention(s): Repositioned, Monitored during session    Home Living                          Prior Function            PT Goals (current goals can now be found in the care plan section) Acute Rehab PT Goals Patient Stated Goal: return home and see her grandson PT Goal Formulation: With patient Time For Goal Achievement: 11/27/22 Potential to Achieve Goals: Good Progress towards PT goals: Progressing toward goals    Frequency    Min 3X/week      PT Plan Current plan remains appropriate    Co-evaluation              AM-PAC PT "6 Clicks" Mobility   Outcome Measure  Help needed turning from your back to your side while in a flat bed without using bedrails?: A Little Help needed moving from lying on your back to sitting on the side of a flat bed without using bedrails?: A Little Help needed moving to and from a bed to a chair (including a wheelchair)?: A Little Help needed standing up from a chair using your arms (e.g., wheelchair or bedside chair)?: A Little Help needed to walk in hospital room?: A Little Help needed climbing 3-5 steps with a railing? : A Lot 6 Click Score: 17    End of Session Equipment Utilized During Treatment: Oxygen Activity Tolerance: Patient tolerated treatment well Patient left: with call bell/phone within reach;in bed;with bed alarm set   PT Visit Diagnosis: Unsteadiness on feet (R26.81);Muscle weakness (generalized) (M62.81)     Time: 2841-3244 PT Time Calculation (min) (ACUTE ONLY): 29 min  Charges:  $Gait Training: 8-22 mins $Therapeutic Activity: 8-22 mins                     Candie Mile, PT, DPT Physical  Therapist Acute Rehabilitation Services [place] 11/18/2022, 3:15 PM

## 2022-11-18 NOTE — Progress Notes (Signed)
PROGRESS NOTE    [name]  BMW:[id number] DOB: [birth year] DOA: 11/11/2022 PCP: [name], FNP   Brief Narrative: 88 year old past medical history significant for CAD, pulmonary hypertension, right subclavian artery stenosis, permanent A-fib, diabetes type 2, hypertension, stage IV low-grade Carcinoid/neuroendocrine presents complaining of worsening shortness of breath on exertion over the last past several months, but worse over the last several days was advised to present to the ED by Dr. Einar Gip. Patient  also reports some chocking episodes with meals. CTA negative for PE, moderate right and small left pleural effusion with associated atelectasis, and newly mildly enlarged mediastinal and right hilar lymph node reactive need CT follow-up.  Numerous bilateral solid pulmonary nodules unchanged from 2018.  Pulmonary consulted, dyspnea, hypoxia multifactorial. Plan to resume diuretics and monitor kidney function.    Assessment & Plan:   Principal Problem:   Hypoxia Active Problems:   Carcinoid tumor of lung   Atrial fibrillation (HCC) CHA2DS2-VASc Score 7   HTN (hypertension)   DM2 (diabetes mellitus, type 2) (HCC)   CAD (coronary artery disease)   Anemia   1-Dyspnea on Exertion:  CTA Chest with moderate right pleural effusion.  Received IV lasix times 3 doses. Develops AKI.  Pulmonology consulted for evaluation of Amiodarone toxicity ? , as recommended by Dr Einar Gip.  CCM consulted. Dyspnea thought to be multifactorial. pleural effusion, acute on chronic diastolic HF, Low grade lung neuroendocrine tumor.  -Patient qualifies for home oxygen. CM will arrange.  -Chest x ray; BL pleural effusion increased. Pulmonary recommend diuretics when renal function allows it.  -Plan to resume low dose lasix 11/21. Monitor renal function.  -Limits benefit from nebulizer, per pulmonology might stop it.   2-Acute Hypoxic resp failure:  Multifactorial, secondary to pleural effusion,  acute on chronic diastolic HF, Low grade lung neuroendocrine tumor.  Started on Pulmicort nebulizer.  Nocturnal home oxygen ordered.  Plan to start low dose lasix.   3-Acute on Chronic Diastolic HF  Presents with dyspnea, pleural effusion, increase BNP.  Lasix held due to AKI.  Resume lasix. Monitor renal function.   AKI; In setting diuretics, diarrhea, vomiting.  Lasix held. Received Low rate IV fluids.  Cr peak to 1.8--- down to 1.2 Monitor on lasix.   Nausea, vomiting:  Dysphagia.  KUB; marked gastric distension. Assessment for Small Bowel obstruction is limited  CT abdomen pelvis negative for Obstruction.  Continue with schedule  zofran , diet advanced to full liquid diet.  Esophagogram consistent with Severe esophageal dysmotility, likely presbyesophagus.   H/O CAD; S/P PCI Denies chest pain.   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  On Stiolto-- change to Pulmicort by pulmonologist   GERD; PPI change to BID   Diarrhea;  Acute on chronic. No further diarrhea.  Hypomagnesemia; Replaced.  Hypokalemia; Replaced.   A fib; Continue with Cardizem and metoprolol./   Estimated body mass index is 22.05 kg/m as calculated from the following:   Height as of this encounter: 5' 2.01" (1.575 m).   Weight as of this encounter: 54.7 kg.   DVT prophylaxis: Lovenox Code Status: DNR Family Communication: Son [name]) over Phone; 11/21. Disposition Plan:  Status is: Inpatient Remains inpatient appropriate because: management of Resp failure Will consult palliative care for goals of care.    Consultants:  CCM Dr Einar Gip  Procedures:  none  Antimicrobials:    Subjective: She denies worsening dyspnea. She is using oxygen. She does not want a big oxygen tank. \ She has  been doing ok with liquid diet.  Objective: Vitals:   11/18/22 0740 11/18/22 0806 11/18/22 0808 11/18/22 1259  BP: 111/72   114/61  Pulse: 88   82  Resp: 18   (!) 22  Temp: 98.3 F (36.8 C)   (!)  97.4 F (36.3 C)  TempSrc: Oral   Oral  SpO2: 92% 96% 95%   Weight:      Height:        Intake/Output Summary (Last 24 hours) at 11/18/2022 1439 Last data filed at 11/18/2022 1259 Gross per 24 hour  Intake --  Output 400 ml  Net -400 ml    Filed Weights   11/13/22 0600  Weight: 54.7 kg    Examination:  General exam: NAD Respiratory system: BL air movement.  Cardiovascular system: S 1, S  2 RRR Gastrointestinal system: BS present,soft, nt Central nervous system: Alert, follow command Extremities: No edema   Data Reviewed: I have personally reviewed following labs and imaging studies  CBC: Recent Labs  Lab 11/14/22 0458 11/15/22 0222 11/16/22 0946 11/17/22 0802 11/18/22 0326  WBC 5.9 5.6 4.7 4.7 4.4  HGB 10.7* 10.5* 10.1* 10.0* 9.9*  HCT 31.0* 31.0* 30.1* 30.1* 29.9*  MCV 86.8 87.6 88.3 89.3 88.7  PLT 125* 112* 109* 111* 120*    Basic Metabolic Panel: Recent Labs  Lab 11/13/22 0348 11/14/22 0458 11/15/22 0222 11/16/22 0946 11/17/22 0802 11/18/22 0326  NA 134* 137 135 134* 135 136  K 3.3* 3.9 4.2 4.1 4.4 4.3  CL 100 101 100 101 103 101  CO2 24 26 23 23 24 25   GLUCOSE 203* 143* 159* 180* 135* 139*  BUN 19 22 23 22 19 15   CREATININE 1.25* 1.85* 1.70* 1.65* 1.47* 1.29*  CALCIUM 8.4* 8.8* 8.8* 8.5* 8.5* 8.7*  MG 1.2* 1.7  --   --   --   --     GFR: Estimated Creatinine Clearance: 24.8 mL/min (A) (by C-G formula based on SCr of 1.29 mg/dL (H)). Liver Function Tests: Recent Labs  Lab 11/17/22 0802 11/18/22 0326  AST 25  --   ALT 17  --   ALKPHOS 47  --   BILITOT 0.6  --   PROT 5.9*  --   ALBUMIN 3.0* 3.0*   No results for input(s): "LIPASE", "AMYLASE" in the last 168 hours. No results for input(s): "AMMONIA" in the last 168 hours. Coagulation Profile: No results for input(s): "INR", "PROTIME" in the last 168 hours. Cardiac Enzymes: No results for input(s): "CKTOTAL", "CKMB", "CKMBINDEX", "TROPONINI" in the last 168 hours. BNP (last 3  results) No results for input(s): "PROBNP" in the last 8760 hours. HbA1C: No results for input(s): "HGBA1C" in the last 72 hours.  CBG: Recent Labs  Lab 11/17/22 1746 11/17/22 2116 11/18/22 0029 11/18/22 0742 11/18/22 1303  GLUCAP 137* 161* 131* 144* 131*    Lipid Profile: No results for input(s): "CHOL", "HDL", "LDLCALC", "TRIG", "CHOLHDL", "LDLDIRECT" in the last 72 hours. Thyroid Function Tests: No results for input(s): "TSH", "T4TOTAL", "FREET4", "T3FREE", "THYROIDAB" in the last 72 hours. Anemia Panel: No results for input(s): "VITAMINB12", "FOLATE", "FERRITIN", "TIBC", "IRON", "RETICCTPCT" in the last 72 hours. Sepsis Labs: Recent Labs  Lab 11/12/22 2245  PROCALCITON 0.12     Recent Results (from the past 240 hour(s))  Resp Panel by RT-PCR (Flu A&B, Covid) Anterior Nasal Swab     Status: None   Collection Time: 11/12/22  1:44 PM   Specimen: Anterior Nasal Swab  Result Value Ref Range  Status   SARS Coronavirus 2 by RT PCR NEGATIVE NEGATIVE Final    Comment: (NOTE) SARS-CoV-2 target nucleic acids are NOT DETECTED.  The SARS-CoV-2 RNA is generally detectable in upper respiratory specimens during the acute phase of infection. The lowest concentration of SARS-CoV-2 viral copies this assay can detect is 138 copies/mL. A negative result does not preclude SARS-Cov-2 infection and should not be used as the sole basis for treatment or other patient management decisions. A negative result may occur with  improper specimen collection/handling, submission of specimen other than nasopharyngeal swab, presence of viral mutation(s) within the areas targeted by this assay, and inadequate number of viral copies(<138 copies/mL). A negative result must be combined with clinical observations, patient history, and epidemiological information. The expected result is Negative.  Fact Sheet for Patients:  EntrepreneurPulse.com.au  Fact Sheet for Healthcare  Providers:  IncredibleEmployment.be  This test is no t yet approved or cleared by the [place] FDA and  has been authorized for detection and/or diagnosis of SARS-CoV-2 by FDA under an Emergency Use Authorization (EUA). This EUA will remain  in effect (meaning this test can be used) for the duration of the COVID-19 declaration under Section 564(b)(1) of the Act, 21 U.S.C.section 360bbb-3(b)(1), unless the authorization is terminated  or revoked sooner.       Influenza A by PCR NEGATIVE NEGATIVE Final   Influenza B by PCR NEGATIVE NEGATIVE Final    Comment: (NOTE) The Xpert Xpress SARS-CoV-2/FLU/RSV plus assay is intended as an aid in the diagnosis of influenza from Nasopharyngeal swab specimens and should not be used as a sole basis for treatment. Nasal washings and aspirates are unacceptable for Xpert Xpress SARS-CoV-2/FLU/RSV testing.  Fact Sheet for Patients: EntrepreneurPulse.com.au  Fact Sheet for Healthcare Providers: IncredibleEmployment.be  This test is not yet approved or cleared by the [place] FDA and has been authorized for detection and/or diagnosis of SARS-CoV-2 by FDA under an Emergency Use Authorization (EUA). This EUA will remain in effect (meaning this test can be used) for the duration of the COVID-19 declaration under Section 564(b)(1) of the Act, 21 U.S.C. section 360bbb-3(b)(1), unless the authorization is terminated or revoked.  Performed at [facility], [place] [address] [place], [place] [address]          Radiology Studies: DG ESOPHAGUS W SINGLE CM (SOL OR THIN BA)  Result Date: 11/17/2022 CLINICAL DATA:  Dysphagia, globus sensation EXAM: ESOPHAGUS/BARIUM SWALLOW/TABLET STUDY TECHNIQUE: Single contrast examination was performed using thin liquid barium. This exam was performed by Reatha Armour, PA-C , and was supervised and interpreted by Dr Abigail Miyamoto. FLUOROSCOPY: Radiation  Exposure Index (as provided by the fluoroscopic device): 17.40 mGy Kerma COMPARISON:  None Available. FINDINGS: Limited study performed due to patient's inability to tolerate positioning. Esophagus: Normal appearance. Esophageal motility: Severe esophageal dysmotility with stasis of contrast material in mid and distal esophagus Hiatal Hernia: None visualized Gastroesophageal reflux: None visualized. Ingested 72mm barium tablet: Not given Other: None. IMPRESSION: Severe esophageal dysmotility, likely presbyesophagus. Limited, focused single-contrast exam performed. Electronically Signed   By: Abigail Miyamoto M.D.   On: 11/17/2022 12:17        Scheduled Meds:  apixaban  2.5 mg Oral BID   budesonide (PULMICORT) nebulizer solution  0.5 mg Nebulization BID   diltiazem  180 mg Oral Daily   furosemide  20 mg Oral Daily   insulin aspart  0-9 Units Subcutaneous TID WC   ipratropium  0.5 mg Nebulization BID   levalbuterol  1.25 mg Nebulization BID   levothyroxine  88 mcg Oral Q0600   metoprolol succinate  50 mg Oral Daily   ondansetron (ZOFRAN) IV  4 mg Intravenous Q8H   pantoprazole (PROTONIX) IV  40 mg Intravenous Q12H   rosuvastatin  20 mg Oral Daily   Continuous Infusions:   LOS: 5 days    Time spent: 35 minutes    Storey Stangeland A Khristine Verno, MD [facility]   If 7PM-7AM, please contact night-coverage [email or web address]  11/18/2022, 2:39 PM

## 2022-11-18 NOTE — Progress Notes (Addendum)
Mobility Specialist Progress Note:   11/18/22 0935  Mobility  Activity Transferred from bed to chair  Level of Assistance Contact guard assist, steadying assist  Assistive Device Front wheel walker  Distance Ambulated (ft) 5 ft  Activity Response Tolerated well  Mobility Referral Yes  $Mobility charge 1 Mobility   Pt eager for mobility session, however requesting to eat breakfast prior to ambulation. Pt agreeable to transfer to chair. Required minG assist with RW to pivot transfer to recliner. HR 117bpm; Desat to 86% on 2LO2, recovered with rest. Pt left with all needs met, chair alarm on.   Nelta Numbers Mobility Specialist Please contact via SecureChat or  Rehab office at [phone]

## 2022-11-19 DIAGNOSIS — R0902 Hypoxemia: Secondary | ICD-10-CM | POA: Diagnosis not present

## 2022-11-19 DIAGNOSIS — J9601 Acute respiratory failure with hypoxia: Secondary | ICD-10-CM | POA: Diagnosis not present

## 2022-11-19 DIAGNOSIS — R531 Weakness: Secondary | ICD-10-CM | POA: Diagnosis not present

## 2022-11-19 DIAGNOSIS — Z515 Encounter for palliative care: Secondary | ICD-10-CM

## 2022-11-19 DIAGNOSIS — Z66 Do not resuscitate: Secondary | ICD-10-CM

## 2022-11-19 DIAGNOSIS — R54 Age-related physical debility: Secondary | ICD-10-CM

## 2022-11-19 LAB — BASIC METABOLIC PANEL
Anion gap: 11 (ref 5–15)
BUN: 24 mg/dL — ABNORMAL HIGH (ref 8–23)
CO2: 24 mmol/L (ref 22–32)
Calcium: 8.7 mg/dL — ABNORMAL LOW (ref 8.9–10.3)
Chloride: 98 mmol/L (ref 98–111)
Creatinine, Ser: 1.48 mg/dL — ABNORMAL HIGH (ref 0.44–1.00)
GFR, Estimated: 34 mL/min — ABNORMAL LOW (ref >60)
Glucose, Bld: 204 mg/dL — ABNORMAL HIGH (ref 70–99)
Potassium: 4.4 mmol/L (ref 3.5–5.1)
Sodium: 133 mmol/L — ABNORMAL LOW (ref 135–145)

## 2022-11-19 LAB — CBC
HCT: 30 % — ABNORMAL LOW (ref 36.0–46.0)
Hemoglobin: 10.2 g/dL — ABNORMAL LOW (ref 12.0–15.0)
MCH: 29.8 pg (ref 26.0–34.0)
MCHC: 34 g/dL (ref 30.0–36.0)
MCV: 87.7 fL (ref 80.0–100.0)
Platelets: 121 10*3/uL — ABNORMAL LOW (ref 150–400)
RBC: 3.42 MIL/uL — ABNORMAL LOW (ref 3.87–5.11)
RDW: 13.2 % (ref 11.5–15.5)
WBC: 5.4 10*3/uL (ref 4.0–10.5)
nRBC: 0 % (ref 0.0–0.2)

## 2022-11-19 LAB — GLUCOSE, CAPILLARY
Glucose-Capillary: 171 mg/dL — ABNORMAL HIGH (ref 70–99)
Glucose-Capillary: 215 mg/dL — ABNORMAL HIGH (ref 70–99)
Glucose-Capillary: 216 mg/dL — ABNORMAL HIGH (ref 70–99)
Glucose-Capillary: 174 mg/dL — ABNORMAL HIGH (ref 70–99)

## 2022-11-19 MED ORDER — METOPROLOL SUCCINATE ER 100 MG PO TB24
100.0000 mg | ORAL_TABLET | Freq: Every day | ORAL | Status: DC
  Administered 2022-11-20 – 2022-11-25 (×6): 100 mg via ORAL
  Filled 2022-11-19 (×6): qty 1

## 2022-11-19 MED ORDER — PANTOPRAZOLE SODIUM 40 MG PO TBEC
40.0000 mg | DELAYED_RELEASE_TABLET | Freq: Every day | ORAL | Status: DC
  Administered 2022-11-20 – 2022-11-26 (×7): 40 mg via ORAL
  Filled 2022-11-19 (×7): qty 1

## 2022-11-19 NOTE — TOC Progression Note (Addendum)
Transition of Care [facility]) - Progression Note    Patient Details  Name: [name] MRN: [id number] Date of Birth: [birth year]  Transition of Care [facility]) CM/SW [place], LCSW Phone Number: 11/19/2022, 3:23 PM  Clinical Narrative:    CSW spoke with patient's son (POA) [name]. He is interested in SNF rehab for patient but he would like for him and his two brothers to have the conversation with her about it tonight to see if she will agree to go. CSW emailed him the SNF bed offers ([email or web address]).  [name] requested CSW check into [facility] close to where he lives. CSW contacted Margarita Grizzle there and she stated they are out of network with patient's insurance.    Expected Discharge Plan: [place] Barriers to Discharge: Continued Medical Work up  Expected Discharge Plan and Services Expected Discharge Plan: [place]   Discharge Planning Services: CM Consult Post Acute Care Choice: [place] arrangements for the past 2 months: Single Family Home                 DME Arranged: Oxygen DME Agency: [employer] Date DME Agency Contacted: 11/17/22 Time DME Agency Contacted: 0940 Representative spoke with at DME Agency: [place]: PT [place]: [place] ([place]) Date [place]: 11/14/22 Time [place]: 1448 Representative spoke with at [place]: [place] ([place]) Interventions    Readmission Risk Interventions     No data to display

## 2022-11-19 NOTE — TOC Progression Note (Signed)
Transition of Care [facility]) - Progression Note    Patient Details  Name: [name] MRN: [id number] Date of Birth: Mar 27, 1934  Transition of Care [facility]) CM/SW [place], LCSW Phone Number: 11/19/2022, 11:02 AM  Clinical Narrative:    CSW received call from Palliative stating family is requesting SNF placement. CSW will send out referral.    Expected Discharge Plan: [place] Barriers to Discharge: Continued Medical Work up  Expected Discharge Plan and Services Expected Discharge Plan: [place]   Discharge Planning Services: CM Consult Post Acute Care Choice: [place] arrangements for the past 2 months: Single Family Home                 DME Arranged: Oxygen DME Agency: [employer] Date DME Agency Contacted: 11/17/22 Time DME Agency Contacted: 3085 Representative spoke with at DME Agency: [place]: PT [place]: [place] ([place]) Date [place]: 11/14/22 Time [place]: 1448 Representative spoke with at [place]: [place] ([place]) Interventions    Readmission Risk Interventions     No data to display

## 2022-11-19 NOTE — Progress Notes (Signed)
Mobility Specialist Progress Note:   11/19/22 1100  Mobility  Activity Ambulated with assistance in hallway  Level of Assistance Contact guard assist, steadying assist  Assistive Device Front wheel walker  Distance Ambulated (ft) 200 ft  Activity Response Tolerated fair  Mobility Referral Yes  $Mobility charge 1 Mobility   Pt eager for mobility session. Required only contact guard assist for safety. Pt with significant fatigue as distance increased, requiring multiple standing rest breaks towards EOS, with cues for posture. VSS on 2LO2. Pt back in chair with all needs met, chair alarm on.   Nelta Numbers Mobility Specialist Please contact via SecureChat or  Rehab office at [phone]

## 2022-11-19 NOTE — Plan of Care (Signed)
  Problem: Education: Goal: Ability to describe self-care measures that may prevent or decrease complications (Diabetes Survival Skills Education) will improve Outcome: Progressing Goal: Individualized Educational Video(s) Outcome: Progressing   Problem: Coping: Goal: Ability to adjust to condition or change in health will improve Outcome: Progressing   Problem: Fluid Volume: Goal: Ability to maintain a balanced intake and output will improve Outcome: Progressing   Problem: Health Behavior/Discharge Planning: Goal: Ability to identify and utilize available resources and services will improve Outcome: Progressing Goal: Ability to manage health-related needs will improve Outcome: Progressing   Problem: Metabolic: Goal: Ability to maintain appropriate glucose levels will improve Outcome: Progressing   Problem: Nutritional: Goal: Maintenance of adequate nutrition will improve Outcome: Progressing Goal: Progress toward achieving an optimal weight will improve Outcome: Progressing   Problem: Skin Integrity: Goal: Risk for impaired skin integrity will decrease Outcome: Progressing   Problem: Tissue Perfusion: Goal: Adequacy of tissue perfusion will improve Outcome: Progressing   Problem: Education: Goal: Knowledge of General Education information will improve Description: Including pain rating scale, medication(s)/side effects and non-pharmacologic comfort measures Outcome: Progressing   Problem: Health Behavior/Discharge Planning: Goal: Ability to manage health-related needs will improve Outcome: Progressing   Problem: Clinical Measurements: Goal: Ability to maintain clinical measurements within normal limits will improve Outcome: Progressing Goal: Will remain free from infection Outcome: Progressing Goal: Diagnostic test results will improve Outcome: Progressing Goal: Respiratory complications will improve Outcome: Progressing Goal: Cardiovascular complication will  be avoided Outcome: Progressing   Problem: Nutrition: Goal: Adequate nutrition will be maintained Outcome: Progressing   Problem: Activity: Goal: Risk for activity intolerance will decrease Outcome: Progressing   Problem: Coping: Goal: Level of anxiety will decrease Outcome: Progressing   Problem: Elimination: Goal: Will not experience complications related to bowel motility Outcome: Progressing Goal: Will not experience complications related to urinary retention Outcome: Progressing   Problem: Pain Managment: Goal: General experience of comfort will improve Outcome: Progressing   Problem: Safety: Goal: Ability to remain free from injury will improve Outcome: Progressing   Problem: Skin Integrity: Goal: Risk for impaired skin integrity will decrease Outcome: Progressing   

## 2022-11-19 NOTE — Progress Notes (Signed)
PROGRESS NOTE    [name]  AFB:[id number] DOB: [birth year] DOA: 11/11/2022 PCP: [name], FNP   Brief Narrative: 88 year old past medical history significant for CAD, pulmonary hypertension, right subclavian artery stenosis, permanent A-fib, diabetes type 2, hypertension, stage IV low-grade Carcinoid/neuroendocrine presents complaining of worsening shortness of breath on exertion over the last past several months, but worse over the last several days was advised to present to the ED by Dr. Einar Gip. Patient  also reports some chocking episodes with meals. CTA negative for PE, moderate right and small left pleural effusion with associated atelectasis, and newly mildly enlarged mediastinal and right hilar lymph node reactive need CT follow-up.  Numerous bilateral solid pulmonary nodules unchanged from 2018.  Pulmonary consulted, dyspnea, hypoxia multifactorial. Plan to resume diuretics and monitor kidney function.    Assessment & Plan:   Principal Problem:   Hypoxia Active Problems:   Carcinoid tumor of lung   Atrial fibrillation (HCC) CHA2DS2-VASc Score 7   HTN (hypertension)   Hypothyroid   DM2 (diabetes mellitus, type 2) (HCC)   CAD (coronary artery disease)   Anemia   Protein-calorie malnutrition, severe   1-Dyspnea on Exertion:  CTA Chest with moderate right pleural effusion.  Received IV lasix times 3 doses. Develops AKI.  Pulmonology consulted for evaluation of Amiodarone toxicity ? , as recommended by Dr Einar Gip.  CCM consulted. Dyspnea thought to be multifactorial. pleural effusion, acute on chronic diastolic HF, Low grade lung neuroendocrine tumor.  -Patient qualifies for home oxygen. CM will arrange.  -Chest x ray; BL pleural effusion increased. Pulmonary recommend diuretics when renal function allows it.  - lasix now resumed, will monitor renal function -Limits benefit from nebulizer, per pulmonology might stop it.   2-Acute Hypoxic resp failure:   Multifactorial, secondary to pleural effusion, acute on chronic diastolic HF, Low grade lung neuroendocrine tumor.  Started on Pulmicort nebulizer.  Nocturnal home oxygen ordered.  Plan to start low dose lasix.   3-Acute on Chronic Diastolic HF  Presents with dyspnea, pleural effusion, increase BNP.  Lasix held due to AKI.  Resume lasix. Monitor renal function.   AKI; In setting diuretics, diarrhea, vomiting.  Lasix held. Received Low rate IV fluids.  Cr peak to 1.8--- down to 1.2 Monitor on lasix.   Nausea, vomiting:  Dysphagia.  KUB; marked gastric distension. Assessment for Small Bowel obstruction is limited  CT abdomen pelvis negative for Obstruction.  GI has evaluated and signed off, now is tolerating diet  H/O CAD; S/P PCI Denies chest pain.   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  On Stiolto-- change to Pulmicort by pulmonologist. Followed by pulm as outpt  GERD; PPI change to BID  Diarrhea;  Acute on chronic. No further diarrhea.  Hypomagnesemia; Replaced.  Hypokalemia; Replaced.   A fib; Continue with Cardizem and metoprolol. And apixaban. Increase metop today given elevated hr  Estimated body mass index is 22.05 kg/m as calculated from the following:   Height as of this encounter: 5' 2.01" (1.575 m).   Weight as of this encounter: 54.7 kg.   DVT prophylaxis: apixaban Code Status: DNR Family Communication: no answer when either son called today Disposition Plan:  Status is: Inpatient Remains inpatient appropriate because: pursuing snf placement   Consultants:  CCM Dr Einar Gip  Procedures:  none  Antimicrobials:    Subjective: Says breathing is stable. No pain. Tolerated breakfast  Objective: Vitals:   11/19/22 0851 11/19/22 0856 11/19/22 1000 11/19/22 1345  BP:  106/67 (!) 121/97  Pulse:   (!) 108 (!) 117  Resp:   (!) 26 20  Temp:    98 F (36.7 C)  TempSrc:    Oral  SpO2: 99% 98% 94% 96%  Weight:      Height:         Intake/Output Summary (Last 24 hours) at 11/19/2022 1419 Last data filed at 11/19/2022 0900 Gross per 24 hour  Intake 600 ml  Output --  Net 600 ml   Filed Weights   11/13/22 0600  Weight: 54.7 kg    Examination:  General exam: NAD Respiratory system: rales at bases Cardiovascular system: S 1, S  2 RRR Gastrointestinal system: BS present,soft, nt Central nervous system: Alert, follow command Extremities: No edema   Data Reviewed: I have personally reviewed following labs and imaging studies  CBC: Recent Labs  Lab 11/15/22 0222 11/16/22 0946 11/17/22 0802 11/18/22 0326 11/19/22 0617  WBC 5.6 4.7 4.7 4.4 5.4  HGB 10.5* 10.1* 10.0* 9.9* 10.2*  HCT 31.0* 30.1* 30.1* 29.9* 30.0*  MCV 87.6 88.3 89.3 88.7 87.7  PLT 112* 109* 111* 120* 510*   Basic Metabolic Panel: Recent Labs  Lab 11/13/22 0348 11/14/22 0458 11/15/22 0222 11/16/22 0946 11/17/22 0802 11/18/22 0326 11/19/22 0617  NA 134* 137 135 134* 135 136 133*  K 3.3* 3.9 4.2 4.1 4.4 4.3 4.4  CL 100 101 100 101 103 101 98  CO2 24 26 23 23 24 25 24   GLUCOSE 203* 143* 159* 180* 135* 139* 204*  BUN 19 22 23 22 19 15  24*  CREATININE 1.25* 1.85* 1.70* 1.65* 1.47* 1.29* 1.48*  CALCIUM 8.4* 8.8* 8.8* 8.5* 8.5* 8.7* 8.7*  MG 1.2* 1.7  --   --   --   --   --    GFR: Estimated Creatinine Clearance: 21.6 mL/min (A) (by C-G formula based on SCr of 1.48 mg/dL (H)). Liver Function Tests: Recent Labs  Lab 11/17/22 0802 11/18/22 0326  AST 25  --   ALT 17  --   ALKPHOS 47  --   BILITOT 0.6  --   PROT 5.9*  --   ALBUMIN 3.0* 3.0*   No results for input(s): "LIPASE", "AMYLASE" in the last 168 hours. No results for input(s): "AMMONIA" in the last 168 hours. Coagulation Profile: No results for input(s): "INR", "PROTIME" in the last 168 hours. Cardiac Enzymes: No results for input(s): "CKTOTAL", "CKMB", "CKMBINDEX", "TROPONINI" in the last 168 hours. BNP (last 3 results) No results for input(s): "PROBNP" in  the last 8760 hours. HbA1C: No results for input(s): "HGBA1C" in the last 72 hours.  CBG: Recent Labs  Lab 11/18/22 1303 11/18/22 1600 11/18/22 2121 11/19/22 0759 11/19/22 1252  GLUCAP 131* 198* 162* 171* 215*   Lipid Profile: No results for input(s): "CHOL", "HDL", "LDLCALC", "TRIG", "CHOLHDL", "LDLDIRECT" in the last 72 hours. Thyroid Function Tests: No results for input(s): "TSH", "T4TOTAL", "FREET4", "T3FREE", "THYROIDAB" in the last 72 hours. Anemia Panel: No results for input(s): "VITAMINB12", "FOLATE", "FERRITIN", "TIBC", "IRON", "RETICCTPCT" in the last 72 hours. Sepsis Labs: Recent Labs  Lab 11/12/22 2245  PROCALCITON 0.12    Recent Results (from the past 240 hour(s))  Resp Panel by RT-PCR (Flu A&B, Covid) Anterior Nasal Swab     Status: None   Collection Time: 11/12/22  1:44 PM   Specimen: Anterior Nasal Swab  Result Value Ref Range Status   SARS Coronavirus 2 by RT PCR NEGATIVE NEGATIVE Final    Comment: (NOTE)  SARS-CoV-2 target nucleic acids are NOT DETECTED.  The SARS-CoV-2 RNA is generally detectable in upper respiratory specimens during the acute phase of infection. The lowest concentration of SARS-CoV-2 viral copies this assay can detect is 138 copies/mL. A negative result does not preclude SARS-Cov-2 infection and should not be used as the sole basis for treatment or other patient management decisions. A negative result may occur with  improper specimen collection/handling, submission of specimen other than nasopharyngeal swab, presence of viral mutation(s) within the areas targeted by this assay, and inadequate number of viral copies(<138 copies/mL). A negative result must be combined with clinical observations, patient history, and epidemiological information. The expected result is Negative.  Fact Sheet for Patients:  EntrepreneurPulse.com.au  Fact Sheet for Healthcare Providers:   IncredibleEmployment.be  This test is no t yet approved or cleared by the [place] FDA and  has been authorized for detection and/or diagnosis of SARS-CoV-2 by FDA under an Emergency Use Authorization (EUA). This EUA will remain  in effect (meaning this test can be used) for the duration of the COVID-19 declaration under Section 564(b)(1) of the Act, 21 U.S.C.section 360bbb-3(b)(1), unless the authorization is terminated  or revoked sooner.       Influenza A by PCR NEGATIVE NEGATIVE Final   Influenza B by PCR NEGATIVE NEGATIVE Final    Comment: (NOTE) The Xpert Xpress SARS-CoV-2/FLU/RSV plus assay is intended as an aid in the diagnosis of influenza from Nasopharyngeal swab specimens and should not be used as a sole basis for treatment. Nasal washings and aspirates are unacceptable for Xpert Xpress SARS-CoV-2/FLU/RSV testing.  Fact Sheet for Patients: EntrepreneurPulse.com.au  Fact Sheet for Healthcare Providers: IncredibleEmployment.be  This test is not yet approved or cleared by the [place] FDA and has been authorized for detection and/or diagnosis of SARS-CoV-2 by FDA under an Emergency Use Authorization (EUA). This EUA will remain in effect (meaning this test can be used) for the duration of the COVID-19 declaration under Section 564(b)(1) of the Act, 21 U.S.C. section 360bbb-3(b)(1), unless the authorization is terminated or revoked.  Performed at [facility], [place] [address] [place], [place] [address]          Radiology Studies: No results found.      Scheduled Meds:  apixaban  2.5 mg Oral BID   budesonide (PULMICORT) nebulizer solution  0.5 mg Nebulization BID   diltiazem  180 mg Oral Daily   feeding supplement  237 mL Oral BID BM   furosemide  20 mg Oral Daily   insulin aspart  0-9 Units Subcutaneous TID WC   ipratropium  0.5 mg Nebulization BID   levalbuterol  1.25 mg  Nebulization BID   levothyroxine  88 mcg Oral Q0600   metoprolol succinate  50 mg Oral Daily   multivitamin with minerals  1 tablet Oral Daily   ondansetron (ZOFRAN) IV  4 mg Intravenous Q8H   pantoprazole (PROTONIX) IV  40 mg Intravenous Q12H   rosuvastatin  20 mg Oral Daily   Continuous Infusions:   LOS: 6 days     Desma Maxim, MD [facility]   If 7PM-7AM, please contact night-coverage [email or web address]  11/19/2022, 2:19 PM

## 2022-11-19 NOTE — Progress Notes (Signed)
[facility] Gastroenterology Progress Note  [name] 88 y.o. 1934-06-17  CC:  nausea and vomiting   Subjective: Patient is tolerating full liquids without difficulty.  She states she would rather not move up to solid foods at this time as she is scared it would give her nausea and vomiting.  Denies nausea and vomiting right now.  Denies abdominal pain.  ROS : Review of Systems  Constitutional:  Negative for chills, fever and weight loss.  Gastrointestinal:  Negative for abdominal pain, blood in stool, constipation, diarrhea, heartburn, melena, nausea and vomiting.      Objective: Vital signs in last 24 hours: Vitals:   11/19/22 0422 11/19/22 0802  BP: 111/61 98/80  Pulse:  92  Resp:  20  Temp: 98 F (36.7 C) 98.2 F (36.8 C)  SpO2:  95%    Physical Exam:  General:  Frail elderly female  Head:  Normocephalic, without obvious abnormality, atraumatic  Eyes:  Anicteric sclera, EOM's intact  Lungs:   Clear to auscultation bilaterally, respirations unlabored  Heart:  Regular rate and rhythm, S1, S2 normal  Abdomen:   Soft, non-tender, bowel sounds active all four quadrants,  no masses,     Lab Results: Recent Labs    11/18/22 0326 11/19/22 0617  NA 136 133*  K 4.3 4.4  CL 101 98  CO2 25 24  GLUCOSE 139* 204*  BUN 15 24*  CREATININE 1.29* 1.48*  CALCIUM 8.7* 8.7*   Recent Labs    11/17/22 0802 11/18/22 0326  AST 25  --   ALT 17  --   ALKPHOS 47  --   BILITOT 0.6  --   PROT 5.9*  --   ALBUMIN 3.0* 3.0*   Recent Labs    11/18/22 0326 11/19/22 0617  WBC 4.4 5.4  HGB 9.9* 10.2*  HCT 29.9* 30.0*  MCV 88.7 87.7  PLT 120* 121*   No results for input(s): "LABPROT", "INR" in the last 72 hours.    Assessment Nausea and vomiting -CT shows moderate distention on stomach.  Artifact versus motility (gastroparesis) issue versus partial gastric outlet obstruction - esophagram: severe dysmotility.   Hypoxic respiratory failure   Neuroendocrine lung  cancer   Plan: Continue full liquid diet as tolerated.  If continuing to have fearful eating could consider speech therapy. Continue slow eating, adequate mastication, small bites. [facility] will follow  Garnette Scheuermann PA-C 11/19/2022, 8:43 AM  Contact #  [phone]

## 2022-11-19 NOTE — Progress Notes (Signed)
Physical Therapy Treatment Patient Details Name: [name] MRN: [id number] DOB: 10-06-1934 Today's Date: 11/19/2022   History of Present Illness Pt is 88 year old presented to [facility] on  11/11/22 with SOB. Pt developed afib with RVR in ED. Pt with nausea/vomiting likely due to esophageal dysmotility. 11/21 Pulmonary consulted, dyspnea, hypoxia multifactorial. PMH - CAD, pulmonary htn, afib, dm, htn, lung CA, stroke.    PT Comments    More fatigued with elevated HR today compared to yesterday. Still requires min assist to rise from chair. RW for gait at close guard level for safety, 2 standing rest breaks, SpO2 89-93% on 2L supplemental O2, HR elevated to 140s at peak today, moderately dyspneic and very fatigued limiting further distance. States she walked further earlier this morning. Due to continued need for physical assist and supervision, it would be appropriate to consider ST SNF for additional rehab prior to returning home alone. Patient will continue to benefit from skilled physical therapy services to further improve independence with functional mobility.    Recommendations for follow up therapy are one component of a multi-disciplinary discharge planning process, led by the attending physician.  Recommendations may be updated based on patient status, additional functional criteria and insurance authorization.  Follow Up Recommendations  Skilled nursing-short term rehab (<3 hours/day) Can patient physically be transported by private vehicle: Yes   Assistance Recommended at Discharge Frequent or constant Supervision/Assistance  Patient can return home with the following Help with stairs or ramp for entrance;Assistance with cooking/housework;A little help with walking and/or transfers;A little help with bathing/dressing/bathroom;Assist for transportation   Equipment Recommendations  None recommended by PT    Recommendations for Other Services       Precautions / Restrictions  Precautions Precautions: Fall;Other (comment) Precaution Comments: O2 sats and HR Restrictions Weight Bearing Restrictions: No     Mobility  Bed Mobility               General bed mobility comments: in recliner    Transfers Overall transfer level: Needs assistance Equipment used: Rolling walker (2 wheels) Transfers: Sit to/from Stand Sit to Stand: Min assist           General transfer comment: min assist for boost to stand from recliner. Cues for hand placement, and forward weight shift. Requires a couple of attempts with rocking for momentum. Tremulous upon standing but stabilizes with bil hands on RW for support. Mild posterior instabilit ynoted.    Ambulation/Gait Ambulation/Gait assistance: Min guard Gait Distance (Feet): 70 Feet Assistive device: Rolling walker (2 wheels) Gait Pattern/deviations: Step-through pattern, Decreased stride length, Trunk flexed Gait velocity: decr Gait velocity interpretation: <1.31 ft/sec, indicative of household ambulator   General Gait Details: Pt seemingly more fatigued today. HR up to 140s at peak during ambulatory bout, more dyspnea today, on 2L with SpO2 89-93%. Required 2 standing rest breaks to complete distance. Reports UE fatigue, cues for upright posture and energy conservation techniques.   Stairs             Wheelchair Mobility    Modified Rankin (Stroke Patients Only)       Balance Overall balance assessment: Needs assistance Sitting-balance support: Feet supported, No upper extremity supported Sitting balance-Leahy Scale: Good     Standing balance support: During functional activity, Reliant on assistive device for balance, Bilateral upper extremity supported Standing balance-Leahy Scale: Poor Standing balance comment: UE support and Min guard assist  Cognition Arousal/Alertness: Awake/alert Behavior During Therapy: WFL for tasks assessed/performed Overall  Cognitive Status: Within Functional Limits for tasks assessed                                          Exercises General Exercises - Lower Extremity Ankle Circles/Pumps: AROM, Both, 10 reps, Seated Quad Sets: Strengthening, Both, 10 reps, Seated Gluteal Sets: Strengthening, Both, 10 reps, Seated Long Arc Quad: Strengthening, Both, 10 reps, Seated Hip ABduction/ADduction: Strengthening, Both, 10 reps, Seated    General Comments General comments (skin integrity, edema, etc.): HR into 140s while walking today. At rest HR 110s - higher than session yesterday.      Pertinent Vitals/Pain Pain Assessment Pain Assessment: Faces Faces Pain Scale: Hurts little more Pain Location: back Pain Descriptors / Indicators: Discomfort Pain Intervention(s): Limited activity within patient's tolerance, Monitored during session, Repositioned    Home Living                          Prior Function            PT Goals (current goals can now be found in the care plan section) Acute Rehab PT Goals Patient Stated Goal: return home and see her grandson PT Goal Formulation: With patient Time For Goal Achievement: 11/27/22 Potential to Achieve Goals: Good Progress towards PT goals: Progressing toward goals    Frequency    Min 3X/week      PT Plan Discharge plan needs to be updated    Co-evaluation              AM-PAC PT "6 Clicks" Mobility   Outcome Measure  Help needed turning from your back to your side while in a flat bed without using bedrails?: A Little Help needed moving from lying on your back to sitting on the side of a flat bed without using bedrails?: A Little Help needed moving to and from a bed to a chair (including a wheelchair)?: A Little Help needed standing up from a chair using your arms (e.g., wheelchair or bedside chair)?: A Little Help needed to walk in hospital room?: A Little Help needed climbing 3-5 steps with a railing? : A Lot 6  Click Score: 17    End of Session Equipment Utilized During Treatment: Oxygen;Gait belt Activity Tolerance: Patient limited by fatigue Patient left: with call bell/phone within reach;in chair;with chair alarm set   PT Visit Diagnosis: Unsteadiness on feet (R26.81);Muscle weakness (generalized) (M62.81)     Time: 8546-2703 PT Time Calculation (min) (ACUTE ONLY): 21 min  Charges:  $Gait Training: 8-22 mins                     Candie Mile, PT, DPT Physical Therapist [facility] [place] 11/19/2022, 1:55 PM

## 2022-11-19 NOTE — Consult Note (Signed)
Consultation Note Date: 11/19/2022   Patient Name: [name]  DOB: 07/28/1934  MRN: [id number]  Age / Sex: 88 y.o., female  PCP: [name], [place] Referring Physician: Gwynne Edinger, MD  Reason for Consultation: Establishing goals of care and Psychosocial/spiritual support  HPI/Patient Profile: 88 y.o. female   admitted on 11/11/2022 with   past medical history significant for CAD, pulmonary hypertension, right subclavian artery stenosis, permanent A-fib, diabetes type 2, hypertension, stage IV low-grade Carcinoid/neuroendocrine presents complaining of worsening shortness of breath on exertion over the last past several months, but worse over the last several days was advised to present to the ED by Dr. Einar Gip.  Patient  also reports some chocking episodes with meals. CTA negative for PE, moderate right and small left pleural effusion with associated atelectasis, and newly mildly enlarged mediastinal and right hilar lymph node reactive need CT follow-up.  Numerous bilateral solid pulmonary nodules unchanged from 2018.  Today is day 8 of this hosptial stay   Patient and family face treatment option decisions, advanced directive decisions and anticipatory care needs.  Clinical Assessment and Goals of Care:  This NP Wadie Lessen reviewed medical records, received report from team, assessed the patient and then meet at the patient's bedside and spoke by phone to son/HPOA/Glenn Maxwell Caul   to discuss diagnosis, prognosis, GOC, EOL wishes disposition and options.   Concept of Palliative Care was introduced as specialized medical care for people and their families living with serious illness.  If focuses on providing relief from the symptoms and stress of a serious illness.  The goal is to improve quality of life for both the patient and the family.  Values and goals of care important to patient and  family were attempted to be elicited.  Created space and opportunity for patient  and family to explore thoughts and feelings regarding current medical situation.  Patient herself is alert and oriented and understands her current weakened state.  She does verbalize a desire to "keep on moving" and ultimately return to her home independently.    She tells me she likes to nap when she wants and watch TV when she wants.  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  He speaks to her great determination and goal for improvement and return home.     A  discussion was had today regarding advanced directives.  Concepts specific to code status, artifical feeding and hydration, continued IV antibiotics and rehospitalization was had.    And tells me that there is paperwork documenting H POA and advance care planning, he will email that to me tomorrow      Questions and concerns addressed.  Patient  encouraged to call with questions or concerns.     PMT will continue to support holistically.       HPOA/ Abel Presto      SUMMARY OF RECOMMENDATIONS    Code Status/Advance Care Planning: DNR  Palliative Prophylaxis:  Delirium Protocol and Frequent Pain Assessment  Additional Recommendations (Limitations, Scope, Preferences): Full Scope Treatment  Psycho-social/Spiritual:  Desire for further Chaplaincy support:no Additional Recommendations: Education on Hospice  Prognosis:  Unable to determine  Discharge Planning:  Patient and family are considering SNF for short-term rehab. Exploring options for "[facility] [facility]" number         [phone]    [address]  [place], [place]  This would be closer to family and enable them to be a better support for [name]   To Be Determined      Primary Diagnoses: Present on Admission:   Hypoxia  Anemia  Atrial fibrillation (HCC) CHA2DS2-VASc Score 7  CAD (coronary artery disease)  Carcinoid tumor of lung  HTN (hypertension)   I have reviewed the medical record, interviewed the patient and family, and examined the patient. The following aspects are pertinent.  Past Medical History:  Diagnosis Date   Acute diverticulitis 06/2017   Anemia 06/2017   CAD (coronary artery disease) 03/13/2012   Diabetes mellitus    Dysrhythmia    ATRIAL FIB   Fall 07/07/2017   HTN (hypertension) 03/13/2012   Hypothyroid 03/13/2012   Lung cancer (HCC)    Shortness of breath    Stroke ([place])    Syncope and collapse 04/02/2016   Type II or unspecified type diabetes mellitus without mention of complication, not stated as uncontrolled 03/13/2012   Social History   Socioeconomic History   Marital status: Widowed    Spouse name: Not on file   Number of children: 3   Years of education: Not on file   Highest education level: Not on file  Occupational History   Not on file  Tobacco Use   Smoking status: Never   Smokeless tobacco: Never  Vaping Use   Vaping Use: Never used  Substance and Sexual Activity   Alcohol use: No   Drug use: No   Sexual activity: Yes    Birth control/protection: None  Other Topics Concern   Not on file  Social History Narrative   Not on file   Social Determinants of Health   Financial Resource Strain: Not on file  Food Insecurity: Not on file  Transportation Needs: Not on file  Physical Activity: Not on file  Stress: Not on file  Social Connections: Not on file   Family History  Problem Relation Age of Onset   Breast cancer Mother    Aneurysm Father        Likely thoracic aneurysm per patient   Hypertension Sister    Heart disease Sister    Diabetes type II Sister    Diabetes type II Sister    Diverticulitis Sister    Diabetes type II Sister    Stroke Sister    Heart disease Brother    Melanoma Brother    Melanoma Brother     Scheduled Meds:  apixaban  2.5 mg Oral BID   budesonide (PULMICORT) nebulizer solution  0.5 mg Nebulization BID   diltiazem  180 mg Oral Daily   feeding supplement  237 mL Oral BID BM   furosemide  20 mg Oral Daily   insulin aspart  0-9 Units Subcutaneous TID WC   ipratropium  0.5 mg Nebulization BID   levalbuterol  1.25 mg Nebulization BID   levothyroxine  88 mcg Oral Q0600   metoprolol succinate  50 mg Oral Daily   multivitamin with minerals  1 tablet  Oral Daily   ondansetron (ZOFRAN) IV  4 mg Intravenous Q8H   pantoprazole (PROTONIX) IV  40 mg Intravenous Q12H   rosuvastatin  20 mg Oral Daily   Continuous Infusions: PRN Meds:.acetaminophen, guaiFENesin, hydrALAZINE, metoprolol tartrate, senna-docusate, traZODone Medications Prior to Admission:  Prior to Admission medications   Medication Sig Start Date End Date Taking? Authorizing Provider  acetaminophen (TYLENOL) 500 MG tablet Take 500-1,000 mg by mouth every 6 (six) hours as needed for moderate pain.   Yes [provider]  calcium carbonate (TUMS) 500 MG chewable tablet Chew 1 tablet by mouth daily.   Yes [provider]  Calcium Citrate-Vitamin D ([employer] CALCIUM CITRATE +D3 MINI PO) Take 1 capsule by mouth daily.   Yes [provider]  cyanocobalamin (VITAMIN B12) 1000 MCG tablet Take 3,000 mcg by mouth daily.   Yes [provider]  diltiazem (DILT-XR) 180 MG 24 hr capsule TAKE 1 CAPSULE(180 MG) BY MOUTH DAILY 10/21/22  Yes Adrian Prows, MD  Fluocinolone Acetonide 0.01 % OIL Place 5 drops in ear(s) daily. 07/25/22  Yes [provider]  insulin degludec (TRESIBA FLEXTOUCH) 100 UNIT/ML FlexTouch Pen Inject 8 Units into the skin at bedtime. 05/13/22  Yes [provider]  lactose free nutrition (BOOST) LIQD Take 237 mLs by mouth daily.   Yes [provider]  levothyroxine (SYNTHROID) 88 MCG tablet Take 88 mcg by mouth daily. 07/25/22  Yes [provider]  metoprolol  succinate (TOPROL-XL) 50 MG 24 hr tablet Take 1 tablet (50 mg total) by mouth daily. 06/20/22  Yes Adrian Prows, MD  rosuvastatin (CRESTOR) 20 MG tablet Take 1 tablet (20 mg total) by mouth daily. 06/20/22 12/17/22 Yes Adrian Prows, MD  sitaGLIPtin (JANUVIA) 50 MG tablet Take 50 mg by mouth daily.   Yes [provider]  Tiotropium Bromide-Olodaterol (STIOLTO RESPIMAT) 2.5-2.5 MCG/ACT AERS Inhale 2 puffs into the lungs daily. 08/26/22  Yes Freddi Starr, MD  Insulin Pen Needle (NOVOFINE PLUS PEN NEEDLE) 32G X 4 MM MISC ONE 04/10/22   [provider]  pantoprazole (PROTONIX) 40 MG tablet Take 1 tablet (40 mg total) by mouth daily. Patient not taking: Reported on 11/12/2022 08/26/22   Freddi Starr, MD   Allergies  Allergen Reactions   Codeine Nausea And Vomiting   Hydrocodone-Acetaminophen Nausea And Vomiting   Review of Systems  Physical Exam  Vital Signs: BP 98/80 (BP Location: Left Arm)   Pulse 92   Temp 98.2 F (36.8 C) (Oral)   Resp 20   Ht 5' 2.01" (1.575 m)   Wt 54.7 kg   SpO2 98%   BMI 22.05 kg/m  Pain Scale: 0-10   Pain Score: Asleep   SpO2: SpO2: 98 % O2 Device:SpO2: 98 % O2 Flow Rate: .O2 Flow Rate (L/min): 2 L/min  IO: Intake/output summary:  Intake/Output Summary (Last 24 hours) at 11/19/2022 0857 Last data filed at 11/18/2022 1259 Gross per 24 hour  Intake --  Output 400 ml  Net -400 ml    LBM: Last BM Date : 11/15/22 Baseline Weight: Weight: 54.7 kg Most recent weight: Weight: 54.7 kg     Palliative Assessment/Data: 50 %    Discussed with TOC team   Time In: 1500 Time Out: 1615 Time Total: 75 minutes Greater than 50%  of this time was spent counseling and coordinating care related to the above assessment and plan.  Signed by: Wadie Lessen, NP   Please contact Palliative Medicine Team phone at [phone] for questions  and concerns.  For individual provider: See Shea Evans

## 2022-11-19 NOTE — NC FL2 (Signed)
[place] MEDICAID FL2 LEVEL OF CARE SCREENING TOOL     IDENTIFICATION  Patient Name: [name] Birthdate: Jan 18, 1934 Sex: female Admission Date (Current Location): 11/11/2022  [facility] and [place] Number:  Herbalist and Address:  [facility], [place] [address], [place], [place] [address]      Provider Number: [id number]  Attending Physician Name and Address:  Gwynne Edinger, MD  Relative Name and Phone Number:       Current Level of Care: Hospital Recommended Level of Care: [place] Prior Approval Number:    Date Approved/Denied:   PASRR Number: [id number]  Discharge Plan: SNF    Current Diagnoses: Patient Active Problem List   Diagnosis Date Noted   Protein-calorie malnutrition, severe 11/18/2022   Hypoxia 11/12/2022   Chronic atrial fibrillation with rapid ventricular response ([place]) 08/16/2018   Chest pain 07/08/2017   ARF (acute renal failure) ([place]) 07/08/2017   Acute diverticulitis 07/08/2017   Head injury    Right hip pain 01/03/2017   Muscle strain 01/03/2017   Atrial fibrillation with rapid ventricular response ([place]) 12/12/2016   Atrial fibrillation with RVR ([place])    Diabetes mellitus with complication ([place])    Faintness    Thrombocytopenia ([place]) 04/07/2016   Anemia 04/07/2016   SOB (shortness of breath) on exertion    Syncope and collapse    Syncope 04/02/2016   Physical deconditioning 04/20/2015   Dyspnea 04/17/2015   SOB (shortness of breath) 04/17/2015   Acute renal failure superimposed on stage 3 chronic kidney disease ([place]) 04/17/2015   Hypokalemia 04/17/2015   Atrial fibrillation (HCC) CHA2DS2-VASc Score 7 03/13/2012   HTN (hypertension) 03/13/2012   Hypothyroid 03/13/2012   DM2 (diabetes mellitus, type 2) ([place]) 03/13/2012   CAD (coronary artery disease) 03/13/2012   Carcinoid tumor of lung    Stroke ([place])     Orientation RESPIRATION BLADDER Height & Weight     Self, Time,  Situation, Place  O2 (2L Nasal cannula) Incontinent Weight: 120 lb 9.5 oz (54.7 kg) Height:  5' 2.01" (157.5 cm)  BEHAVIORAL SYMPTOMS/MOOD NEUROLOGICAL BOWEL NUTRITION STATUS      Continent Diet (See dc summary)  AMBULATORY STATUS COMMUNICATION OF NEEDS Skin   Limited Assist Verbally Normal                       Personal Care Assistance Level of Assistance  Bathing, Feeding, Dressing Bathing Assistance: Limited assistance Feeding assistance: Independent Dressing Assistance: Limited assistance     Functional Limitations Info             SPECIAL CARE FACTORS FREQUENCY  PT (By licensed PT), OT (By licensed OT)     PT Frequency: 5x/week OT Frequency: 5x/week            Contractures Contractures Info: Not present    Additional Factors Info  Code Status, Allergies, Insulin Sliding Scale Code Status Info: DNR Allergies Info: Codeine, Hydrocodone-acetaminophen   Insulin Sliding Scale Info: See dc summary       Current Medications (11/19/2022):  This is the current hospital active medication list Current Facility-Administered Medications  Medication Dose Route Frequency Provider Last Rate Last Admin   acetaminophen (TYLENOL) tablet 650 mg  650 mg Oral Q6H PRN Amin, Ankit Chirag, MD   650 mg at 11/18/22 2133   apixaban (ELIQUIS) tablet 2.5 mg  2.5 mg Oral BID Adrian Prows, MD   2.5 mg at 11/19/22 1007   budesonide (  PULMICORT) nebulizer solution 0.5 mg  0.5 mg Nebulization BID Alfredo Martinez, Brandi L, NP   0.5 mg at 11/19/22 0851   diltiazem (CARDIZEM CD) 24 hr capsule 180 mg  180 mg Oral Daily Amin, Ankit Chirag, MD   180 mg at 11/19/22 1007   feeding supplement (ENSURE ENLIVE / ENSURE PLUS) liquid 237 mL  237 mL Oral BID BM Regalado, Belkys A, MD   237 mL at 11/19/22 1009   furosemide (LASIX) tablet 20 mg  20 mg Oral Daily Regalado, Belkys A, MD   20 mg at 11/19/22 1007   guaiFENesin (ROBITUSSIN) 100 MG/5ML liquid 5 mL  5 mL Oral Q4H PRN Amin, Ankit Chirag, MD        hydrALAZINE (APRESOLINE) injection 10 mg  10 mg Intravenous Q4H PRN Amin, Ankit Chirag, MD       insulin aspart (novoLOG) injection 0-9 Units  0-9 Units Subcutaneous TID WC Amin, Ankit Chirag, MD   2 Units at 11/19/22 1010   ipratropium (ATROVENT) nebulizer solution 0.5 mg  0.5 mg Nebulization BID Regalado, Belkys A, MD   0.5 mg at 11/19/22 0851   levalbuterol (XOPENEX) nebulizer solution 1.25 mg  1.25 mg Nebulization BID Regalado, Belkys A, MD   1.25 mg at 11/19/22 0851   levothyroxine (SYNTHROID) tablet 88 mcg  88 mcg Oral Q0600 Amin, Ankit Chirag, MD   88 mcg at 11/19/22 0643   metoprolol succinate (TOPROL-XL) 24 hr tablet 50 mg  50 mg Oral Daily Amin, Ankit Chirag, MD   50 mg at 11/19/22 1007   metoprolol tartrate (LOPRESSOR) injection 5 mg  5 mg Intravenous Q4H PRN Amin, Ankit Chirag, MD   5 mg at 11/14/22 1848   multivitamin with minerals tablet 1 tablet  1 tablet Oral Daily Regalado, Belkys A, MD   1 tablet at 11/19/22 1007   ondansetron (ZOFRAN) injection 4 mg  4 mg Intravenous Smiley Houseman, MD   4 mg at 11/19/22 0643   pantoprazole (PROTONIX) injection 40 mg  40 mg Intravenous Q12H Regalado, Belkys A, MD   40 mg at 11/19/22 1010   rosuvastatin (CRESTOR) tablet 20 mg  20 mg Oral Daily Amin, Ankit Chirag, MD   20 mg at 11/19/22 1007   senna-docusate (Senokot-S) tablet 1 tablet  1 tablet Oral QHS PRN Amin, Ankit Chirag, MD       traZODone (DESYREL) tablet 50 mg  50 mg Oral QHS PRN Damita Lack, MD   50 mg at 11/18/22 2133     Discharge Medications: Please see discharge summary for a list of discharge medications.  Relevant Imaging Results:  Relevant Lab Results:   Additional Information SSN [id number]  Benard Halsted, LCSW

## 2022-11-20 DIAGNOSIS — R531 Weakness: Secondary | ICD-10-CM | POA: Diagnosis not present

## 2022-11-20 DIAGNOSIS — R54 Age-related physical debility: Secondary | ICD-10-CM | POA: Diagnosis not present

## 2022-11-20 DIAGNOSIS — E43 Unspecified severe protein-calorie malnutrition: Secondary | ICD-10-CM | POA: Diagnosis not present

## 2022-11-20 DIAGNOSIS — R0902 Hypoxemia: Secondary | ICD-10-CM | POA: Diagnosis not present

## 2022-11-20 DIAGNOSIS — Z515 Encounter for palliative care: Secondary | ICD-10-CM | POA: Diagnosis not present

## 2022-11-20 LAB — BASIC METABOLIC PANEL
Anion gap: 13 (ref 5–15)
BUN: 26 mg/dL — ABNORMAL HIGH (ref 8–23)
CO2: 24 mmol/L (ref 22–32)
Calcium: 9 mg/dL (ref 8.9–10.3)
Chloride: 93 mmol/L — ABNORMAL LOW (ref 98–111)
Creatinine, Ser: 1.57 mg/dL — ABNORMAL HIGH (ref 0.44–1.00)
GFR, Estimated: 32 mL/min — ABNORMAL LOW (ref >60)
Glucose, Bld: 212 mg/dL — ABNORMAL HIGH (ref 70–99)
Potassium: 4.5 mmol/L (ref 3.5–5.1)
Sodium: 130 mmol/L — ABNORMAL LOW (ref 135–145)

## 2022-11-20 LAB — GLUCOSE, CAPILLARY
Glucose-Capillary: 224 mg/dL — ABNORMAL HIGH (ref 70–99)
Glucose-Capillary: 239 mg/dL — ABNORMAL HIGH (ref 70–99)
Glucose-Capillary: 186 mg/dL — ABNORMAL HIGH (ref 70–99)
Glucose-Capillary: 236 mg/dL — ABNORMAL HIGH (ref 70–99)

## 2022-11-20 MED ORDER — INSULIN GLARGINE-YFGN 100 UNIT/ML ~~LOC~~ SOLN
5.0000 [IU] | Freq: Every day | SUBCUTANEOUS | Status: DC
  Administered 2022-11-20: 5 [IU] via SUBCUTANEOUS
  Filled 2022-11-20 (×2): qty 0.05

## 2022-11-20 NOTE — Plan of Care (Signed)

## 2022-11-20 NOTE — Progress Notes (Signed)
PROGRESS NOTE    [name]  ION:[id number] DOB: 1934/11/20 DOA: 11/11/2022 PCP: [name], FNP   Brief Narrative: 88 year old past medical history significant for CAD, pulmonary hypertension, right subclavian artery stenosis, permanent A-fib, diabetes type 2, hypertension, stage IV low-grade Carcinoid/neuroendocrine presents complaining of worsening shortness of breath on exertion over the last past several months, but worse over the last several days was advised to present to the ED by Dr. Einar Gip. Patient  also reports some chocking episodes with meals. CTA negative for PE, moderate right and small left pleural effusion with associated atelectasis, and newly mildly enlarged mediastinal and right hilar lymph node reactive need CT follow-up.  Numerous bilateral solid pulmonary nodules unchanged from 2018.  Pulmonary consulted, dyspnea, hypoxia multifactorial. Plan to resume diuretics and monitor kidney function.    Assessment & Plan:   Principal Problem:   Hypoxia Active Problems:   Carcinoid tumor of lung   Atrial fibrillation (HCC) CHA2DS2-VASc Score 7   HTN (hypertension)   Hypothyroid   DM2 (diabetes mellitus, type 2) (HCC)   CAD (coronary artery disease)   Anemia   Protein-calorie malnutrition, severe   Dyspnea on Exertion:  CTA Chest with moderate right pleural effusion.  Received IV lasix times 3 doses. Develops AKI.  Pulmonology consulted for evaluation of Amiodarone toxicity ? , as recommended by Dr Einar Gip.  Dyspnea thought to be multifactorial. pleural effusion, acute on chronic diastolic HF, Low grade lung neuroendocrine tumor.  -Patient qualifies for home oxygen. CM will arrange.  -Chest x ray; BL pleural effusion increased. Pulmonary recommend diuretics when renal function allows it.  - lasix now resumed, will monitor renal function - Limits benefit from nebulizer, per pulmonology stop  Acute Hypoxic resp failure:  Multifactorial, secondary to  pleural effusion, acute on chronic diastolic HF, Low grade lung neuroendocrine tumor.  Started on Pulmicort nebulizer.  Nocturnal home oxygen ordered.  Lasix resumed  Acute on Chronic Diastolic HF  Presents with dyspnea, pleural effusion, increase BNP.  Lasix held due to AKI.  Resume lasix. Monitor renal function.   AKI on ckd 3b; In setting diuretics, diarrhea, vomiting.  Lasix held. Received Low rate IV fluids.  Cr peak to 1.8--- down to 1.57 which is essentially baseline Monitor on lasix.   Nausea, vomiting:  Dysphagia.  KUB; marked gastric distension. Assessment for Small Bowel obstruction is limited  CT abdomen pelvis negative for Obstruction.  GI has evaluated and signed off, now is tolerating diet  H/O CAD; S/P PCI Denies chest pain.   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  On Stiolto-- change to Pulmicort by pulmonologist. Followed by pulm as outpt  GERD; PPI change to BID  Diarrhea;  Acute on chronic. No further diarrhea.  Hypomagnesemia; Replaced.  Hypokalemia; Replaced.   A fib; Continue with Cardizem and metoprolol. And apixaban. Metop dose increased yesterday. Will d/c scheduled albuterol, see if that helps w/ hr  Estimated body mass index is 22.05 kg/m as calculated from the following:   Height as of this encounter: 5' 2.01" (1.575 m).   Weight as of this encounter: 54.7 kg.   DVT prophylaxis: apixaban Code Status: DNR Family Communication: sons updated telephonically 11/23 Disposition Plan:  Status is: Inpatient Remains inpatient appropriate because: pursuing snf placement   Consultants:  CCM Dr Einar Gip  Procedures:  none  Antimicrobials:    Subjective: Says breathing is stable. No pain. Tolerated breakfast  Objective: Vitals:   11/20/22 0000 11/20/22 0737 11/20/22 0739 11/20/22 0801  BP: 110/62   110/72  Pulse: 100   96  Resp: 20   (!) 26  Temp: 98 F (36.7 C)   98.2 F (36.8 C)  TempSrc: Oral   Oral  SpO2: 93% 96% 96% 94%   Weight:      Height:        Intake/Output Summary (Last 24 hours) at 11/20/2022 1336 Last data filed at 11/20/2022 0700 Gross per 24 hour  Intake --  Output 1350 ml  Net -1350 ml   Filed Weights   11/13/22 0600  Weight: 54.7 kg    Examination:  General exam: NAD Respiratory system: rales at bases Cardiovascular system: S 1, S  2 RRR Gastrointestinal system: BS present,soft, nt Central nervous system: Alert, follow command Extremities: No edema   Data Reviewed: I have personally reviewed following labs and imaging studies  CBC: Recent Labs  Lab 11/15/22 0222 11/16/22 0946 11/17/22 0802 11/18/22 0326 11/19/22 0617  WBC 5.6 4.7 4.7 4.4 5.4  HGB 10.5* 10.1* 10.0* 9.9* 10.2*  HCT 31.0* 30.1* 30.1* 29.9* 30.0*  MCV 87.6 88.3 89.3 88.7 87.7  PLT 112* 109* 111* 120* 720*   Basic Metabolic Panel: Recent Labs  Lab 11/14/22 0458 11/15/22 0222 11/16/22 0946 11/17/22 0802 11/18/22 0326 11/19/22 0617 11/20/22 0142  NA 137   < > 134* 135 136 133* 130*  K 3.9   < > 4.1 4.4 4.3 4.4 4.5  CL 101   < > 101 103 101 98 93*  CO2 26   < > 23 24 25 24 24   GLUCOSE 143*   < > 180* 135* 139* 204* 212*  BUN 22   < > 22 19 15  24* 26*  CREATININE 1.85*   < > 1.65* 1.47* 1.29* 1.48* 1.57*  CALCIUM 8.8*   < > 8.5* 8.5* 8.7* 8.7* 9.0  MG 1.7  --   --   --   --   --   --    < > = values in this interval not displayed.   GFR: Estimated Creatinine Clearance: 20.3 mL/min (A) (by C-G formula based on SCr of 1.57 mg/dL (H)). Liver Function Tests: Recent Labs  Lab 11/17/22 0802 11/18/22 0326  AST 25  --   ALT 17  --   ALKPHOS 47  --   BILITOT 0.6  --   PROT 5.9*  --   ALBUMIN 3.0* 3.0*   No results for input(s): "LIPASE", "AMYLASE" in the last 168 hours. No results for input(s): "AMMONIA" in the last 168 hours. Coagulation Profile: No results for input(s): "INR", "PROTIME" in the last 168 hours. Cardiac Enzymes: No results for input(s): "CKTOTAL", "CKMB", "CKMBINDEX",  "TROPONINI" in the last 168 hours. BNP (last 3 results) No results for input(s): "PROBNP" in the last 8760 hours. HbA1C: No results for input(s): "HGBA1C" in the last 72 hours.  CBG: Recent Labs  Lab 11/19/22 1252 11/19/22 1734 11/19/22 2053 11/20/22 0805 11/20/22 1121  GLUCAP 215* 216* 174* 224* 239*   Lipid Profile: No results for input(s): "CHOL", "HDL", "LDLCALC", "TRIG", "CHOLHDL", "LDLDIRECT" in the last 72 hours. Thyroid Function Tests: No results for input(s): "TSH", "T4TOTAL", "FREET4", "T3FREE", "THYROIDAB" in the last 72 hours. Anemia Panel: No results for input(s): "VITAMINB12", "FOLATE", "FERRITIN", "TIBC", "IRON", "RETICCTPCT" in the last 72 hours. Sepsis Labs: No results for input(s): "PROCALCITON", "LATICACIDVEN" in the last 168 hours.   Recent Results (from the past 240 hour(s))  Resp Panel by RT-PCR (Flu A&B, Covid) Anterior Nasal Swab  Status: None   Collection Time: 11/12/22  1:44 PM   Specimen: Anterior Nasal Swab  Result Value Ref Range Status   SARS Coronavirus 2 by RT PCR NEGATIVE NEGATIVE Final    Comment: (NOTE) SARS-CoV-2 target nucleic acids are NOT DETECTED.  The SARS-CoV-2 RNA is generally detectable in upper respiratory specimens during the acute phase of infection. The lowest concentration of SARS-CoV-2 viral copies this assay can detect is 138 copies/mL. A negative result does not preclude SARS-Cov-2 infection and should not be used as the sole basis for treatment or other patient management decisions. A negative result may occur with  improper specimen collection/handling, submission of specimen other than nasopharyngeal swab, presence of viral mutation(s) within the areas targeted by this assay, and inadequate number of viral copies(<138 copies/mL). A negative result must be combined with clinical observations, patient history, and epidemiological information. The expected result is Negative.  Fact Sheet for Patients:   EntrepreneurPulse.com.au  Fact Sheet for Healthcare Providers:  IncredibleEmployment.be  This test is no t yet approved or cleared by the [place] FDA and  has been authorized for detection and/or diagnosis of SARS-CoV-2 by FDA under an Emergency Use Authorization (EUA). This EUA will remain  in effect (meaning this test can be used) for the duration of the COVID-19 declaration under Section 564(b)(1) of the Act, 21 U.S.C.section 360bbb-3(b)(1), unless the authorization is terminated  or revoked sooner.       Influenza A by PCR NEGATIVE NEGATIVE Final   Influenza B by PCR NEGATIVE NEGATIVE Final    Comment: (NOTE) The Xpert Xpress SARS-CoV-2/FLU/RSV plus assay is intended as an aid in the diagnosis of influenza from Nasopharyngeal swab specimens and should not be used as a sole basis for treatment. Nasal washings and aspirates are unacceptable for Xpert Xpress SARS-CoV-2/FLU/RSV testing.  Fact Sheet for Patients: EntrepreneurPulse.com.au  Fact Sheet for Healthcare Providers: IncredibleEmployment.be  This test is not yet approved or cleared by the [place] FDA and has been authorized for detection and/or diagnosis of SARS-CoV-2 by FDA under an Emergency Use Authorization (EUA). This EUA will remain in effect (meaning this test can be used) for the duration of the COVID-19 declaration under Section 564(b)(1) of the Act, 21 U.S.C. section 360bbb-3(b)(1), unless the authorization is terminated or revoked.  Performed at [facility], [place] [address] [place], [place] [address]          Radiology Studies: No results found.      Scheduled Meds:  apixaban  2.5 mg Oral BID   budesonide (PULMICORT) nebulizer solution  0.5 mg Nebulization BID   diltiazem  180 mg Oral Daily   feeding supplement  237 mL Oral BID BM   furosemide  20 mg Oral Daily   insulin aspart  0-9 Units  Subcutaneous TID WC   ipratropium  0.5 mg Nebulization BID   levalbuterol  1.25 mg Nebulization BID   levothyroxine  88 mcg Oral Q0600   metoprolol succinate  100 mg Oral Daily   multivitamin with minerals  1 tablet Oral Daily   ondansetron (ZOFRAN) IV  4 mg Intravenous Q8H   pantoprazole  40 mg Oral Daily   rosuvastatin  20 mg Oral Daily   Continuous Infusions:   LOS: 7 days     Desma Maxim, MD [facility]   If 7PM-7AM, please contact night-coverage [email or web address]  11/20/2022, 1:36 PM

## 2022-11-20 NOTE — Progress Notes (Signed)
Patient ID: [name], female   DOB: December 02, 1934, 88 y.o.   MRN: [id number]    Progress Note from the Palliative Medicine Team at [facility]   Patient Name: [name]        Date: 11/20/2022 DOB: 1934/12/08  Age: 89 y.o. MRN#: [id number] Attending Physician: Gwynne Edinger, MD Primary Care Physician: [name], FNP Admit Date: 11/11/2022   Medical records reviewed   88 y.o. female   admitted on 11/11/2022 with   past medical history significant for CAD, pulmonary hypertension, right subclavian artery stenosis, permanent A-fib, diabetes type 2, hypertension, stage IV low-grade Carcinoid/neuroendocrine presents complaining of worsening shortness of breath on exertion over the last past several months, but worse over the last several days was advised to present to the ED by Dr. Einar Gip.    CTA negative for PE, moderate right and small left pleural effusion with associated atelectasis, and newly mildly enlarged mediastinal and right hilar lymph node reactive need CT follow-up.  Numerous bilateral solid pulmonary nodules unchanged from 2018.  Day 9 of this hospital realization, patient has had gradual improvements.  Discussion around neck steps in transition of care.  Patient and family are open to short-term rehab they begin to plan for anticipatory care needs into the future     This NP assessed patient at the bedside as a follow up to  yesterday's [place].  Patient is alert and oriented and pleasant with conversation.  She remains debilitated and again verbalizes that she would be in agreement with some short-term rehab.  She verbalizes her concerns around the financial piece of this.  Education offered on the importance of continued work with physical therapy, dietary intake and overall health and wellness.  I let [name] know that I will be out of the hospital until Monday but I will follow-up with her at that time.  She is encouraged to call PMT phone  with any questions or concerns.   Attempted to leave message with patient's son [name] but voicemail box full.  Education offered today regarding  the importance of continued conversation with her family and their  medical providers regarding overall plan of care and treatment options,  ensuring decisions are within the context of the patients values and GOCs.  35 minutes -greater than 50% of the time was spent in counseling and coordination of care  Wadie Lessen NP  Palliative Medicine Team Team Phone # [phone] Pager [phone]

## 2022-11-21 ENCOUNTER — Encounter (HOSPITAL_COMMUNITY): Payer: Medicare Other

## 2022-11-21 DIAGNOSIS — R0902 Hypoxemia: Secondary | ICD-10-CM | POA: Diagnosis not present

## 2022-11-21 LAB — GLUCOSE, CAPILLARY
Glucose-Capillary: 253 mg/dL — ABNORMAL HIGH (ref 70–99)
Glucose-Capillary: 273 mg/dL — ABNORMAL HIGH (ref 70–99)
Glucose-Capillary: 326 mg/dL — ABNORMAL HIGH (ref 70–99)
Glucose-Capillary: 199 mg/dL — ABNORMAL HIGH (ref 70–99)

## 2022-11-21 LAB — BASIC METABOLIC PANEL
Anion gap: 12 (ref 5–15)
BUN: 26 mg/dL — ABNORMAL HIGH (ref 8–23)
CO2: 27 mmol/L (ref 22–32)
Calcium: 8.9 mg/dL (ref 8.9–10.3)
Chloride: 91 mmol/L — ABNORMAL LOW (ref 98–111)
Creatinine, Ser: 1.48 mg/dL — ABNORMAL HIGH (ref 0.44–1.00)
GFR, Estimated: 34 mL/min — ABNORMAL LOW (ref >60)
Glucose, Bld: 211 mg/dL — ABNORMAL HIGH (ref 70–99)
Potassium: 4.4 mmol/L (ref 3.5–5.1)
Sodium: 130 mmol/L — ABNORMAL LOW (ref 135–145)

## 2022-11-21 MED ORDER — ONDANSETRON HCL 4 MG PO TABS
4.0000 mg | ORAL_TABLET | Freq: Three times a day (TID) | ORAL | Status: DC
  Administered 2022-11-21 – 2022-11-26 (×14): 4 mg via ORAL
  Filled 2022-11-21 (×15): qty 1

## 2022-11-21 MED ORDER — INSULIN GLARGINE-YFGN 100 UNIT/ML ~~LOC~~ SOLN
10.0000 [IU] | Freq: Every day | SUBCUTANEOUS | Status: DC
  Administered 2022-11-21: 10 [IU] via SUBCUTANEOUS
  Filled 2022-11-21 (×3): qty 0.1

## 2022-11-21 NOTE — Progress Notes (Signed)
Mobility Specialist Progress Note:   11/21/22 1025  Mobility  Activity Transferred from chair to bed  Level of Assistance Minimal assist, patient does 75% or more  Assistive Device Other (Comment) (HHA)  Distance Ambulated (ft) 5 ft  Activity Response Tolerated fair  Mobility Referral Yes  $Mobility charge 1 Mobility   Pt eager to return to bed. Required minA to stand and transfer to bed with HHA d/t continuing R arm pain. Pt back in bed with all needs met, bed alarm on.   Nelta Numbers Mobility Specialist Please contact via SecureChat or  Rehab office at [phone]

## 2022-11-21 NOTE — Progress Notes (Signed)
PIV to RAC removed per Dr. Si Raider d/t pain, redness, and mild swelling noted to pt's right lower arm. Warm compress applied to right arm.

## 2022-11-21 NOTE — TOC Progression Note (Signed)
Transition of Care [facility]) - Progression Note    Patient Details  Name: ANANDA CAYA MRN: [id number] Date of Birth: 02-Sep-1934  [facility]) CM/SW [place], LCSW Phone Number: 11/21/2022, 12:49 PM  Clinical Narrative:    CSW spoke with patient's son, [name], to ask for SNF choice. He stated he is coming up today and is in the process of selecting a facility. CSW encouraged him to make a choice as soon as possible as patient is medically stable for discharge. He reported understanding.    Expected Discharge Plan: SNF Barriers to Discharge: Pending insurance, SNF choice  Expected Discharge Plan and Services Expected Discharge Plan: [place]   Discharge Planning Services: CM Consult Post Acute Care Choice: [place] arrangements for the past 2 months: Single Family Home                 DME Arranged: Oxygen DME Agency: [employer] Date DME Agency Contacted: 11/17/22 Time DME Agency Contacted: [id number] Representative spoke with at DME Agency: [place]: PT [place]: [place] ([place]) Date [place]: 11/14/22 Time [place]: [phone] Representative spoke with at [place]: [place] ([place]) Interventions    Readmission Risk Interventions     No data to display

## 2022-11-21 NOTE — Progress Notes (Signed)
Mobility Specialist Progress Note:   11/21/22 0900  Mobility  Activity Transferred from bed to chair  Level of Assistance Minimal assist, patient does 75% or more  Assistive Device Other (Comment) (HHA)  Distance Ambulated (ft) 5 ft  Activity Response Tolerated well  Mobility Referral Yes  $Mobility charge 1 Mobility   Pt agreeable to mobility session, however c/o R arm pain. R forearm noted to be red and swollen, RN notified. Pt required minA to stand and take steps with HHA d/t pain. Left with all needs met, chair alarm on.   Anna Kincaid Mobility Specialist Please contact via SecureChat or  Rehab office at [phone]  

## 2022-11-21 NOTE — Progress Notes (Signed)
PROGRESS NOTE    [name]  OJJ:[id number] DOB: 10-23-1934 DOA: 11/11/2022 PCP: [name], FNP   Brief Narrative: 88 year old past medical history significant for CAD, pulmonary hypertension, right subclavian artery stenosis, permanent A-fib, diabetes type 2, hypertension, stage IV low-grade Carcinoid/neuroendocrine presents complaining of worsening shortness of breath on exertion over the last past several months, but worse over the last several days was advised to present to the ED by Dr. Einar Gip. Patient  also reports some chocking episodes with meals. CTA negative for PE, moderate right and small left pleural effusion with associated atelectasis, and newly mildly enlarged mediastinal and right hilar lymph node reactive need CT follow-up.  Numerous bilateral solid pulmonary nodules unchanged from 2018.  Pulmonary consulted, dyspnea, hypoxia multifactorial. Plan to resume diuretics and monitor kidney function.    Assessment & Plan:   Principal Problem:   Hypoxia Active Problems:   Carcinoid tumor of lung   Atrial fibrillation (HCC) CHA2DS2-VASc Score 7   HTN (hypertension)   Hypothyroid   DM2 (diabetes mellitus, type 2) (HCC)   CAD (coronary artery disease)   Anemia   Protein-calorie malnutrition, severe   Dyspnea on Exertion:  CTA Chest with moderate right pleural effusion.  Received IV lasix times 3 doses. Develops AKI.  Pulmonology consulted for evaluation of Amiodarone toxicity ? , as recommended by Dr Einar Gip.  Dyspnea thought to be multifactorial. pleural effusion, acute on chronic diastolic HF, Low grade lung neuroendocrine tumor.  -Patient qualifies for home oxygen. CM will arrange.  -Chest x ray; BL pleural effusion. Pulm advises resuming lasix when kidney function allows, lasix has been resumed, will monitor renal function - Limits benefit from nebulizer, per pulmonology stop  Acute Hypoxic resp failure:  Multifactorial, secondary to pleural effusion,  acute on chronic diastolic HF, Low grade lung neuroendocrine tumor.  Started on Pulmicort nebulizer.  Now stable on 3 liters, will d/c on that Lasix resumed  Tenderness and erythema right forearm Appears to have thrombophlebitis from PIV - nursing to d/c - check PVL  Acute on Chronic Diastolic HF  Presents with dyspnea, pleural effusion, increase BNP.  Lasix held due to AKI. Since resumed   AKI on ckd 3b; In setting diuretics, diarrhea, vomiting.  Lasix held. Received Low rate IV fluids.  Cr peak to 1.8--- down to 1.4-1.5 which appears to be her baseline  Nausea, vomiting:  Dysphagia.  KUB; marked gastric distension. Assessment for Small Bowel obstruction is limited  CT abdomen pelvis negative for Obstruction.  GI has evaluated and signed off, now is tolerating diet  H/O CAD; S/P PCI Denies chest pain.   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  On Stiolto-- change to Pulmicort by pulmonologist. Followed by pulm as outpt  GERD; PPI changed to BID  Diarrhea;  Acute on chronic. No further diarrhea.  Hypomagnesemia; Replaced.  Hypokalemia; Replaced.   A fib; Continue with Cardizem and metoprolol. And apixaban. Metop dose increased and discontinuation of albuterol seems to have also helped hr  Estimated body mass index is 22.05 kg/m as calculated from the following:   Height as of this encounter: 5' 2.01" (1.575 m).   Weight as of this encounter: 54.7 kg.   DVT prophylaxis: apixaban Code Status: DNR Family Communication: sons updated telephonically 11/23 Disposition Plan:  Status is: Inpatient Remains inpatient appropriate because: pursuing snf placement   Consultants:  CCM Dr Einar Gip  Procedures:  none  Antimicrobials:    Subjective: Says breathing is stable. No pain. Tolerated  breakfast  Objective: Vitals:   11/21/22 0400 11/21/22 0444 11/21/22 0600 11/21/22 0800  BP:  111/67  109/76  Pulse: 86 99 85 91  Resp: (!) 30 (!) 22 (!) 25 (!) 30  Temp:  97.9  F (36.6 C)  97.6 F (36.4 C)  TempSrc:  Oral  Oral  SpO2: 93%  94% 93%  Weight:      Height:       No intake or output data in the 24 hours ending 11/21/22 1016  Filed Weights   11/13/22 0600  Weight: 54.7 kg    Examination:  General exam: NAD Respiratory system: rales at bases Cardiovascular system: S 1, S  2 RRR Gastrointestinal system: BS present,soft, nt Central nervous system: Alert, follow command Extremities: No edema   Data Reviewed: I have personally reviewed following labs and imaging studies  CBC: Recent Labs  Lab 11/15/22 0222 11/16/22 0946 11/17/22 0802 11/18/22 0326 11/19/22 0617  WBC 5.6 4.7 4.7 4.4 5.4  HGB 10.5* 10.1* 10.0* 9.9* 10.2*  HCT 31.0* 30.1* 30.1* 29.9* 30.0*  MCV 87.6 88.3 89.3 88.7 87.7  PLT 112* 109* 111* 120* 426*   Basic Metabolic Panel: Recent Labs  Lab 11/17/22 0802 11/18/22 0326 11/19/22 0617 11/20/22 0142 11/21/22 0220  NA 135 136 133* 130* 130*  K 4.4 4.3 4.4 4.5 4.4  CL 103 101 98 93* 91*  CO2 24 25 24 24 27   GLUCOSE 135* 139* 204* 212* 211*  BUN 19 15 24* 26* 26*  CREATININE 1.47* 1.29* 1.48* 1.57* 1.48*  CALCIUM 8.5* 8.7* 8.7* 9.0 8.9   GFR: Estimated Creatinine Clearance: 21.6 mL/min (A) (by C-G formula based on SCr of 1.48 mg/dL (H)). Liver Function Tests: Recent Labs  Lab 11/17/22 0802 11/18/22 0326  AST 25  --   ALT 17  --   ALKPHOS 47  --   BILITOT 0.6  --   PROT 5.9*  --   ALBUMIN 3.0* 3.0*   No results for input(s): "LIPASE", "AMYLASE" in the last 168 hours. No results for input(s): "AMMONIA" in the last 168 hours. Coagulation Profile: No results for input(s): "INR", "PROTIME" in the last 168 hours. Cardiac Enzymes: No results for input(s): "CKTOTAL", "CKMB", "CKMBINDEX", "TROPONINI" in the last 168 hours. BNP (last 3 results) No results for input(s): "PROBNP" in the last 8760 hours. HbA1C: No results for input(s): "HGBA1C" in the last 72 hours.  CBG: Recent Labs  Lab 11/20/22 0805  11/20/22 1121 11/20/22 1613 11/20/22 2012 11/21/22 0822  GLUCAP 224* 239* 186* 236* 253*   Lipid Profile: No results for input(s): "CHOL", "HDL", "LDLCALC", "TRIG", "CHOLHDL", "LDLDIRECT" in the last 72 hours. Thyroid Function Tests: No results for input(s): "TSH", "T4TOTAL", "FREET4", "T3FREE", "THYROIDAB" in the last 72 hours. Anemia Panel: No results for input(s): "VITAMINB12", "FOLATE", "FERRITIN", "TIBC", "IRON", "RETICCTPCT" in the last 72 hours. Sepsis Labs: No results for input(s): "PROCALCITON", "LATICACIDVEN" in the last 168 hours.   Recent Results (from the past 240 hour(s))  Resp Panel by RT-PCR (Flu A&B, Covid) Anterior Nasal Swab     Status: None   Collection Time: 11/12/22  1:44 PM   Specimen: Anterior Nasal Swab  Result Value Ref Range Status   SARS Coronavirus 2 by RT PCR NEGATIVE NEGATIVE Final    Comment: (NOTE) SARS-CoV-2 target nucleic acids are NOT DETECTED.  The SARS-CoV-2 RNA is generally detectable in upper respiratory specimens during the acute phase of infection. The lowest concentration of SARS-CoV-2 viral copies this assay can detect is  138 copies/mL. A negative result does not preclude SARS-Cov-2 infection and should not be used as the sole basis for treatment or other patient management decisions. A negative result may occur with  improper specimen collection/handling, submission of specimen other than nasopharyngeal swab, presence of viral mutation(s) within the areas targeted by this assay, and inadequate number of viral copies(<138 copies/mL). A negative result must be combined with clinical observations, patient history, and epidemiological information. The expected result is Negative.  Fact Sheet for Patients:  EntrepreneurPulse.com.au  Fact Sheet for Healthcare Providers:  IncredibleEmployment.be  This test is no t yet approved or cleared by the [place] FDA and  has been authorized for  detection and/or diagnosis of SARS-CoV-2 by FDA under an Emergency Use Authorization (EUA). This EUA will remain  in effect (meaning this test can be used) for the duration of the COVID-19 declaration under Section 564(b)(1) of the Act, 21 U.S.C.section 360bbb-3(b)(1), unless the authorization is terminated  or revoked sooner.       Influenza A by PCR NEGATIVE NEGATIVE Final   Influenza B by PCR NEGATIVE NEGATIVE Final    Comment: (NOTE) The Xpert Xpress SARS-CoV-2/FLU/RSV plus assay is intended as an aid in the diagnosis of influenza from Nasopharyngeal swab specimens and should not be used as a sole basis for treatment. Nasal washings and aspirates are unacceptable for Xpert Xpress SARS-CoV-2/FLU/RSV testing.  Fact Sheet for Patients: EntrepreneurPulse.com.au  Fact Sheet for Healthcare Providers: IncredibleEmployment.be  This test is not yet approved or cleared by the [place] FDA and has been authorized for detection and/or diagnosis of SARS-CoV-2 by FDA under an Emergency Use Authorization (EUA). This EUA will remain in effect (meaning this test can be used) for the duration of the COVID-19 declaration under Section 564(b)(1) of the Act, 21 U.S.C. section 360bbb-3(b)(1), unless the authorization is terminated or revoked.  Performed at [facility], [place] [address] [place], [place] [address]          Radiology Studies: No results found.      Scheduled Meds:  apixaban  2.5 mg Oral BID   budesonide (PULMICORT) nebulizer solution  0.5 mg Nebulization BID   diltiazem  180 mg Oral Daily   feeding supplement  237 mL Oral BID BM   furosemide  20 mg Oral Daily   insulin aspart  0-9 Units Subcutaneous TID WC   insulin glargine-yfgn  5 Units Subcutaneous QHS   ipratropium  0.5 mg Nebulization BID   levothyroxine  88 mcg Oral Q0600   metoprolol succinate  100 mg Oral Daily   multivitamin with minerals  1 tablet Oral  Daily   ondansetron (ZOFRAN) IV  4 mg Intravenous Q8H   pantoprazole  40 mg Oral Daily   rosuvastatin  20 mg Oral Daily   Continuous Infusions:   LOS: 8 days     Desma Maxim, MD [facility]   If 7PM-7AM, please contact night-coverage [email or web address]  11/21/2022, 10:16 AM

## 2022-11-21 NOTE — Progress Notes (Signed)
Physical Therapy Treatment Patient Details Name: [name] MRN: [id number] DOB: January 18, 1934 Today's Date: 11/21/2022   History of Present Illness Pt is 88 year old presented to [facility] on  11/11/22 with SOB. Pt developed afib with RVR in ED. Pt with nausea/vomiting likely due to esophageal dysmotility. 11/21 Pulmonary consulted, dyspnea, hypoxia multifactorial. PMH - CAD, pulmonary htn, afib, dm, htn, lung CA, stroke.    PT Comments    Remains motivated and eager to work with therapy, still agreeable to post-acute rehab at d/c. Focused on transfer and gait training. Min assist with bed mobility, transfer, and min guard to ambulate short distance. SpO2 87% on 2L supplemental O2, up to 95% on 3L after returning to room to sit and recover. HR 114 today, improved from last visit. Pt seemingly more fatigued, having pain and weakness in RUE, IV was removed earlier after being found red and swollen. Patient will continue to benefit from skilled physical therapy services to further improve independence with functional mobility.    Recommendations for follow up therapy are one component of a multi-disciplinary discharge planning process, led by the attending physician.  Recommendations may be updated based on patient status, additional functional criteria and insurance authorization.  Follow Up Recommendations  Skilled nursing-short term rehab (<3 hours/day) Can patient physically be transported by private vehicle: Yes   Assistance Recommended at Discharge Frequent or constant Supervision/Assistance  Patient can return home with the following Help with stairs or ramp for entrance;Assistance with cooking/housework;A little help with walking and/or transfers;A little help with bathing/dressing/bathroom;Assist for transportation   Equipment Recommendations  None recommended by PT    Recommendations for Other Services       Precautions / Restrictions Precautions Precautions: Fall;Other  (comment) Precaution Comments: O2 sats and HR Restrictions Weight Bearing Restrictions: No     Mobility  Bed Mobility Overal bed mobility: Needs Assistance Bed Mobility: Supine to Sit     Supine to sit: Min assist, HOB elevated     General bed mobility comments: min assist for trunk and LEs out of bed, cues for technique    Transfers Overall transfer level: Needs assistance Equipment used: Rolling walker (2 wheels) Transfers: Sit to/from Stand Sit to Stand: Min assist           General transfer comment: min assist for boost to stand from bed, slightly tremulous upon standing, limited RUE WB due to pain reported (IV recently removed.)    Ambulation/Gait Ambulation/Gait assistance: Min guard Gait Distance (Feet): 70 Feet Assistive device: Rolling walker (2 wheels) Gait Pattern/deviations: Step-through pattern, Decreased stride length, Trunk flexed Gait velocity: decr Gait velocity interpretation: <1.31 ft/sec, indicative of household ambulator   General Gait Details: Min guard for safety, distance limited by fatigue. Cues for upright posture however pt heavily flexed at the trunk today, no using Rt hand due to pain, rather leans onto RW with Rt elbow but able to control at distance attempted today. 3 standing rest break to complete very fatigued. SpO2 on 2L 87; improved to 90s with 3L applied.   Stairs             Wheelchair Mobility    Modified Rankin (Stroke Patients Only)       Balance Overall balance assessment: Needs assistance Sitting-balance support: Feet supported, No upper extremity supported Sitting balance-Leahy Scale: Fair     Standing balance support: During functional activity, Reliant on assistive device for balance, Bilateral upper extremity supported Standing balance-Leahy Scale: Poor Standing balance comment: UE support  req                            Cognition Arousal/Alertness: Awake/alert Behavior During Therapy: WFL  for tasks assessed/performed Overall Cognitive Status: Within Functional Limits for tasks assessed                                          Exercises General Exercises - Lower Extremity Ankle Circles/Pumps: AROM, Both, 10 reps, Seated Quad Sets: Strengthening, Both, 10 reps, Seated Gluteal Sets: Strengthening, Both, 10 reps, Seated Hip ABduction/ADduction: Strengthening, Both, 10 reps, Seated Straight Leg Raises: Strengthening, Both, 5 reps, Seated    General Comments General comments (skin integrity, edema, etc.): At rest on 2.5L SpO2 95%, HR 97, BP 106/63. Ambulating HR to 114, SPO2 87% on 2L, improves to 90s with 3L upon returning to room.      Pertinent Vitals/Pain Pain Assessment Pain Assessment: Faces Faces Pain Scale: Hurts little more Pain Location: RUE Pain Descriptors / Indicators: Sore, Pins and needles Pain Intervention(s): Limited activity within patient's tolerance, Monitored during session    Home Living                          Prior Function            PT Goals (current goals can now be found in the care plan section) Acute Rehab PT Goals Patient Stated Goal: get stronger before going home PT Goal Formulation: With patient Time For Goal Achievement: 11/27/22 Potential to Achieve Goals: Good Progress towards PT goals: Progressing toward goals    Frequency    Min 3X/week      PT Plan Current plan remains appropriate    Co-evaluation              AM-PAC PT "6 Clicks" Mobility   Outcome Measure  Help needed turning from your back to your side while in a flat bed without using bedrails?: A Little Help needed moving from lying on your back to sitting on the side of a flat bed without using bedrails?: A Little Help needed moving to and from a bed to a chair (including a wheelchair)?: A Little Help needed standing up from a chair using your arms (e.g., wheelchair or bedside chair)?: A Little Help needed to walk in  hospital room?: A Little Help needed climbing 3-5 steps with a railing? : A Lot 6 Click Score: 17    End of Session Equipment Utilized During Treatment: Oxygen;Gait belt Activity Tolerance: Patient limited by fatigue Patient left: with call bell/phone within reach;in chair;with chair alarm set;with family/visitor present Nurse Communication: Mobility status PT Visit Diagnosis: Unsteadiness on feet (R26.81);Muscle weakness (generalized) (M62.81)     Time: 2778-2423 PT Time Calculation (min) (ACUTE ONLY): 24 min  Charges:  $Gait Training: 8-22 mins $Therapeutic Activity: 8-22 mins                     Candie Mile, PT, DPT Physical Therapist Acute Rehabilitation Services [place] 11/21/2022, 1:48 PM

## 2022-11-22 ENCOUNTER — Inpatient Hospital Stay (HOSPITAL_COMMUNITY): Payer: Medicare Other

## 2022-11-22 DIAGNOSIS — R0902 Hypoxemia: Secondary | ICD-10-CM | POA: Diagnosis not present

## 2022-11-22 DIAGNOSIS — M79601 Pain in right arm: Secondary | ICD-10-CM

## 2022-11-22 DIAGNOSIS — L539 Erythematous condition, unspecified: Secondary | ICD-10-CM

## 2022-11-22 LAB — BASIC METABOLIC PANEL
Anion gap: 13 (ref 5–15)
BUN: 34 mg/dL — ABNORMAL HIGH (ref 8–23)
CO2: 26 mmol/L (ref 22–32)
Calcium: 9.1 mg/dL (ref 8.9–10.3)
Chloride: 92 mmol/L — ABNORMAL LOW (ref 98–111)
Creatinine, Ser: 1.6 mg/dL — ABNORMAL HIGH (ref 0.44–1.00)
GFR, Estimated: 31 mL/min — ABNORMAL LOW (ref >60)
Glucose, Bld: 207 mg/dL — ABNORMAL HIGH (ref 70–99)
Potassium: 4.3 mmol/L (ref 3.5–5.1)
Sodium: 131 mmol/L — ABNORMAL LOW (ref 135–145)

## 2022-11-22 LAB — URIC ACID: Uric Acid, Serum: 6.2 mg/dL (ref 2.5–7.1)

## 2022-11-22 LAB — GLUCOSE, CAPILLARY
Glucose-Capillary: 190 mg/dL — ABNORMAL HIGH (ref 70–99)
Glucose-Capillary: 190 mg/dL — ABNORMAL HIGH (ref 70–99)
Glucose-Capillary: 255 mg/dL — ABNORMAL HIGH (ref 70–99)
Glucose-Capillary: 269 mg/dL — ABNORMAL HIGH (ref 70–99)

## 2022-11-22 MED ORDER — INSULIN GLARGINE-YFGN 100 UNIT/ML ~~LOC~~ SOLN
15.0000 [IU] | Freq: Every day | SUBCUTANEOUS | Status: DC
  Administered 2022-11-22 – 2022-11-25 (×4): 15 [IU] via SUBCUTANEOUS
  Filled 2022-11-22 (×5): qty 0.15

## 2022-11-22 NOTE — Progress Notes (Signed)
Mobility Specialist Progress Note:   11/22/22 1523  Mobility  Activity Refused mobility   Pt refused mobility d/t fatigue. Will f/u as able.    Andrey Campanile Mobility Specialist Please contact via SecureChat or  Rehab office at [phone]

## 2022-11-22 NOTE — Progress Notes (Signed)
Right upper extremity venous duplex has been completed. Preliminary results can be found in CV Proc through chart review.   11/22/22 12:12 PM [name] RVT

## 2022-11-22 NOTE — Progress Notes (Addendum)
PROGRESS NOTE    [name]  DPO:[id number] DOB: 1934-08-20 DOA: 11/11/2022 PCP: [name], FNP   Brief Narrative: 88 year old past medical history significant for CAD, pulmonary hypertension, right subclavian artery stenosis, permanent A-fib, diabetes type 2, hypertension, stage IV low-grade Carcinoid/neuroendocrine presents complaining of worsening shortness of breath on exertion over the last past several months, but worse over the last several days was advised to present to the ED by Dr. Einar Gip. Patient  also reports some chocking episodes with meals. CTA negative for PE, moderate right and small left pleural effusion with associated atelectasis, and newly mildly enlarged mediastinal and right hilar lymph node reactive need CT follow-up.  Numerous bilateral solid pulmonary nodules unchanged from 2018.  Pulmonary consulted, dyspnea, hypoxia multifactorial. Plan to resume diuretics and monitor kidney function.    Assessment & Plan:   Principal Problem:   Hypoxia Active Problems:   Carcinoid tumor of lung   Atrial fibrillation (HCC) CHA2DS2-VASc Score 7   HTN (hypertension)   Hypothyroid   DM2 (diabetes mellitus, type 2) (HCC)   CAD (coronary artery disease)   Anemia   Protein-calorie malnutrition, severe   Dyspnea on Exertion:  CTA Chest with moderate right pleural effusion.  Received IV lasix times 3 doses. Develops AKI.  Pulmonology consulted for evaluation of Amiodarone toxicity ? , as recommended by Dr Einar Gip.  Dyspnea thought to be multifactorial. pleural effusion, acute on chronic diastolic HF, Low grade lung neuroendocrine tumor.  -Patient qualifies for home oxygen. CM will arrange.  -Chest x ray; BL pleural effusion. Pulm advises resuming lasix when kidney function allows, lasix has been resumed, will monitor renal function - Limits benefit from nebulizer, per pulmonology stop  Acute Hypoxic resp failure:  Multifactorial, secondary to pleural effusion,  acute on chronic diastolic HF, Low grade lung neuroendocrine tumor.  Started on Pulmicort nebulizer.  Now stable on 3 liters, will d/c on that Lasix resumed  Tenderness and erythema right forearm Appears to have thrombophlebitis from PIV. PVL negative. Swelling and tenderness that site (upper forearm) much improved today - monitor  Right wrist swelling Right wrist mildly erythematous, painful to movement, and swollen today. Patient does report a history of gout. PVL negative as above - check x-ray, uric acid - consider treating for gout if negative  Acute on Chronic Diastolic HF  Presents with dyspnea, pleural effusion, increase BNP.  Lasix held due to AKI. Since resumed   AKI on ckd 3b; In setting diuretics, diarrhea, vomiting.  Lasix held. Received Low rate IV fluids.  Cr peak to 1.8--- down to 1.4-1.6 which appears to be her baseline  Nausea, vomiting:  Dysphagia.  KUB; marked gastric distension. Assessment for Small Bowel obstruction is limited  CT abdomen pelvis negative for Obstruction. SLP notes no significant swallowing dysfunction GI has evaluated and signed off, now is tolerating diet  H/O CAD; S/P PCI Denies chest pain.   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  On Stiolto-- change to Pulmicort by pulmonologist. Followed by pulm as outpt  GERD; PPI changed to BID  Diarrhea;  Acute on chronic. No further diarrhea.  Hypomagnesemia; Replaced.  Hypokalemia; Replaced.   A fib; Continue with Cardizem and metoprolol. And apixaban. Metop dose increased and discontinuation of albuterol seems to have also helped hr  Estimated body mass index is 22.05 kg/m as calculated from the following:   Height as of this encounter: 5' 2.01" (1.575 m).   Weight as of this encounter: 54.7 kg.  DVT prophylaxis: apixaban Code Status: DNR Family Communication: sons updated telephonically 11/23. No answer when either son called today. Disposition Plan:  Status is:  Inpatient Remains inpatient appropriate because: pursuing snf placement   Consultants:  CCM Dr Einar Gip  Procedures:  none  Antimicrobials:    Subjective: Says breathing is stable. Right wrist pain  Objective: Vitals:   11/22/22 0752 11/22/22 0800 11/22/22 1000 11/22/22 1200  BP: 114/63   110/63  Pulse: 91 99 87 89  Resp:  (!) 24    Temp:      TempSrc:    (P) Oral  SpO2: 90% (!) 82% 93% 92%  Weight:      Height:        Intake/Output Summary (Last 24 hours) at 11/22/2022 1458 Last data filed at 11/21/2022 1640 Gross per 24 hour  Intake --  Output 500 ml  Net -500 ml    Filed Weights   11/13/22 0600  Weight: 54.7 kg    Examination:  General exam: NAD Respiratory system: rales at bases Cardiovascular system: S 1, S  2 RRR Gastrointestinal system: BS present,soft, nt Central nervous system: Alert, follow command Extremities: No edema\ Msk: mild erythema and swelling and tenderness with motion of right wrist   Data Reviewed: I have personally reviewed following labs and imaging studies  CBC: Recent Labs  Lab 11/16/22 0946 11/17/22 0802 11/18/22 0326 11/19/22 0617  WBC 4.7 4.7 4.4 5.4  HGB 10.1* 10.0* 9.9* 10.2*  HCT 30.1* 30.1* 29.9* 30.0*  MCV 88.3 89.3 88.7 87.7  PLT 109* 111* 120* 096*   Basic Metabolic Panel: Recent Labs  Lab 11/18/22 0326 11/19/22 0617 11/20/22 0142 11/21/22 0220 11/22/22 0240  NA 136 133* 130* 130* 131*  K 4.3 4.4 4.5 4.4 4.3  CL 101 98 93* 91* 92*  CO2 25 24 24 27 26   GLUCOSE 139* 204* 212* 211* 207*  BUN 15 24* 26* 26* 34*  CREATININE 1.29* 1.48* 1.57* 1.48* 1.60*  CALCIUM 8.7* 8.7* 9.0 8.9 9.1   GFR: Estimated Creatinine Clearance: 20 mL/min (A) (by C-G formula based on SCr of 1.6 mg/dL (H)). Liver Function Tests: Recent Labs  Lab 11/17/22 0802 11/18/22 0326  AST 25  --   ALT 17  --   ALKPHOS 47  --   BILITOT 0.6  --   PROT 5.9*  --   ALBUMIN 3.0* 3.0*   No results for input(s): "LIPASE", "AMYLASE"  in the last 168 hours. No results for input(s): "AMMONIA" in the last 168 hours. Coagulation Profile: No results for input(s): "INR", "PROTIME" in the last 168 hours. Cardiac Enzymes: No results for input(s): "CKTOTAL", "CKMB", "CKMBINDEX", "TROPONINI" in the last 168 hours. BNP (last 3 results) No results for input(s): "PROBNP" in the last 8760 hours. HbA1C: No results for input(s): "HGBA1C" in the last 72 hours.  CBG: Recent Labs  Lab 11/21/22 1234 11/21/22 1642 11/21/22 2016 11/22/22 0753 11/22/22 1257  GLUCAP 273* 326* 199* 190* 190*   Lipid Profile: No results for input(s): "CHOL", "HDL", "LDLCALC", "TRIG", "CHOLHDL", "LDLDIRECT" in the last 72 hours. Thyroid Function Tests: No results for input(s): "TSH", "T4TOTAL", "FREET4", "T3FREE", "THYROIDAB" in the last 72 hours. Anemia Panel: No results for input(s): "VITAMINB12", "FOLATE", "FERRITIN", "TIBC", "IRON", "RETICCTPCT" in the last 72 hours. Sepsis Labs: No results for input(s): "PROCALCITON", "LATICACIDVEN" in the last 168 hours.   No results found for this or any previous visit (from the past 240 hour(s)).        Radiology Studies:  VAS [place] UPPER EXTREMITY VENOUS DUPLEX  Result Date: 11/22/2022 UPPER VENOUS STUDY  Patient Name:  BRUCHA AHLQUIST  Date of Exam:   11/22/2022 Medical Rec #: [id number]         Accession #:    [id number] Date of Birth: 1934/06/20         Patient Gender: F Patient Age:   71 years Exam Location:  [facility] Procedure:      VAS [place] UPPER EXTREMITY VENOUS DUPLEX Referring Phys: Laurey Arrow --------------------------------------------------------------------------------  Indications: Erythema, and Pain Risk Factors: None identified. Comparison Study: No prior studies. Performing Technologist: Oliver Hum RVT  Examination Guidelines: A complete evaluation includes B-mode imaging, spectral Doppler, color Doppler, and power Doppler as needed of all accessible portions of each vessel.  Bilateral testing is considered an integral part of a complete examination. Limited examinations for reoccurring indications may be performed as noted.  Right Findings: +----------+------------+---------+-----------+----------+-------+ RIGHT     CompressiblePhasicitySpontaneousPropertiesSummary +----------+------------+---------+-----------+----------+-------+ IJV           Full       Yes       Yes                      +----------+------------+---------+-----------+----------+-------+ Subclavian    Full       Yes       Yes                      +----------+------------+---------+-----------+----------+-------+ Axillary      Full       Yes       Yes                      +----------+------------+---------+-----------+----------+-------+ Brachial      Full       Yes       Yes                      +----------+------------+---------+-----------+----------+-------+ Radial        Full                                          +----------+------------+---------+-----------+----------+-------+ Ulnar         Full                                          +----------+------------+---------+-----------+----------+-------+ Cephalic      Full                                          +----------+------------+---------+-----------+----------+-------+ Basilic       Full                                          +----------+------------+---------+-----------+----------+-------+  Left Findings: +----------+------------+---------+-----------+----------+-------+ LEFT      CompressiblePhasicitySpontaneousPropertiesSummary +----------+------------+---------+-----------+----------+-------+ Subclavian    Full       Yes       Yes                      +----------+------------+---------+-----------+----------+-------+  Summary:  Right:  No evidence of deep vein thrombosis in the upper extremity. No evidence of superficial vein thrombosis in the upper extremity.  Left: No  evidence of thrombosis in the subclavian.  *See table(s) above for measurements and observations.     Preliminary         Scheduled Meds:  apixaban  2.5 mg Oral BID   budesonide (PULMICORT) nebulizer solution  0.5 mg Nebulization BID   diltiazem  180 mg Oral Daily   feeding supplement  237 mL Oral BID BM   furosemide  20 mg Oral Daily   insulin aspart  0-9 Units Subcutaneous TID WC   insulin glargine-yfgn  10 Units Subcutaneous QHS   ipratropium  0.5 mg Nebulization BID   levothyroxine  88 mcg Oral Q0600   metoprolol succinate  100 mg Oral Daily   multivitamin with minerals  1 tablet Oral Daily   ondansetron  4 mg Oral Q8H   pantoprazole  40 mg Oral Daily   rosuvastatin  20 mg Oral Daily   Continuous Infusions:   LOS: 9 days     Desma Maxim, MD [facility]   If 7PM-7AM, please contact night-coverage [email or web address]  11/22/2022, 2:58 PM

## 2022-11-22 NOTE — TOC Progression Note (Addendum)
Transition of Care [facility]) - Progression Note    Patient Details  Name: [name] MRN: [id number] Date of Birth: 1934/11/13  [facility] CM/SW Contact  Ina Homes, [place] Phone Number: 11/22/2022, 1:34 PM  Clinical Narrative:     SW left VM with pt's son [name] ([phone]) to f/u on SNF preference  Update 321pm SW received callback from [place], [place] discussed current BO's. Glenn leaning towards [facility], but wants to check first. [name] will f/u with SW.  Expected Discharge Plan: [place] Barriers to Discharge: Continued Medical Work up  Expected Discharge Plan and Services Expected Discharge Plan: [place]   Discharge Planning Services: CM Consult Post Acute Care Choice: [place] arrangements for the past 2 months: Single Family Home                 DME Arranged: Oxygen DME Agency: [employer] Date DME Agency Contacted: 11/17/22 Time DME Agency Contacted: [id number] Representative spoke with at DME Agency: [place]: PT [place]: [place] ([place]) Date [place]: 11/14/22 Time [place]: 1448 Representative spoke with at [place]: [place] ([place]) Interventions    Readmission Risk Interventions     No data to display

## 2022-11-23 ENCOUNTER — Inpatient Hospital Stay (HOSPITAL_COMMUNITY): Payer: Medicare Other

## 2022-11-23 DIAGNOSIS — R0902 Hypoxemia: Secondary | ICD-10-CM | POA: Diagnosis not present

## 2022-11-23 LAB — CBC
HCT: 26.9 % — ABNORMAL LOW (ref 36.0–46.0)
Hemoglobin: 9.4 g/dL — ABNORMAL LOW (ref 12.0–15.0)
MCH: 29.5 pg (ref 26.0–34.0)
MCHC: 34.9 g/dL (ref 30.0–36.0)
MCV: 84.3 fL (ref 80.0–100.0)
Platelets: 199 10*3/uL (ref 150–400)
RBC: 3.19 MIL/uL — ABNORMAL LOW (ref 3.87–5.11)
RDW: 12.7 % (ref 11.5–15.5)
WBC: 7.1 10*3/uL (ref 4.0–10.5)
nRBC: 0 % (ref 0.0–0.2)

## 2022-11-23 LAB — BASIC METABOLIC PANEL
Anion gap: 11 (ref 5–15)
BUN: 34 mg/dL — ABNORMAL HIGH (ref 8–23)
CO2: 27 mmol/L (ref 22–32)
Calcium: 8.8 mg/dL — ABNORMAL LOW (ref 8.9–10.3)
Chloride: 90 mmol/L — ABNORMAL LOW (ref 98–111)
Creatinine, Ser: 1.36 mg/dL — ABNORMAL HIGH (ref 0.44–1.00)
GFR, Estimated: 38 mL/min — ABNORMAL LOW (ref >60)
Glucose, Bld: 190 mg/dL — ABNORMAL HIGH (ref 70–99)
Potassium: 4.1 mmol/L (ref 3.5–5.1)
Sodium: 128 mmol/L — ABNORMAL LOW (ref 135–145)

## 2022-11-23 LAB — GLUCOSE, CAPILLARY
Glucose-Capillary: 187 mg/dL — ABNORMAL HIGH (ref 70–99)
Glucose-Capillary: 206 mg/dL — ABNORMAL HIGH (ref 70–99)
Glucose-Capillary: 268 mg/dL — ABNORMAL HIGH (ref 70–99)
Glucose-Capillary: 271 mg/dL — ABNORMAL HIGH (ref 70–99)

## 2022-11-23 LAB — C-REACTIVE PROTEIN: CRP: 10.7 mg/dL — ABNORMAL HIGH (ref <1.0)

## 2022-11-23 LAB — CREATININE, URINE, RANDOM: Creatinine, Urine: 53 mg/dL

## 2022-11-23 LAB — BRAIN NATRIURETIC PEPTIDE: B Natriuretic Peptide: 213.3 pg/mL — ABNORMAL HIGH (ref 0.0–100.0)

## 2022-11-23 LAB — SODIUM, URINE, RANDOM: Sodium, Ur: 30 mmol/L

## 2022-11-23 LAB — OSMOLALITY: Osmolality: 278 mOsm/kg (ref 275–295)

## 2022-11-23 LAB — OSMOLALITY, URINE: Osmolality, Ur: 373 mOsm/kg (ref 300–900)

## 2022-11-23 MED ORDER — LACTATED RINGERS IV SOLN: INTRAVENOUS | Status: DC

## 2022-11-23 MED ORDER — TRAMADOL HCL 50 MG PO TABS
50.0000 mg | ORAL_TABLET | Freq: Two times a day (BID) | ORAL | Status: DC | PRN
  Filled 2022-11-23: qty 1

## 2022-11-23 MED ORDER — FUROSEMIDE 10 MG/ML IJ SOLN
40.0000 mg | Freq: Once | INTRAMUSCULAR | Status: AC
  Administered 2022-11-23: 40 mg via INTRAVENOUS
  Filled 2022-11-23: qty 4

## 2022-11-23 MED ORDER — TRAMADOL HCL 50 MG PO TABS
50.0000 mg | ORAL_TABLET | Freq: Once | ORAL | Status: AC
  Administered 2022-11-23: 50 mg via ORAL
  Filled 2022-11-23: qty 1

## 2022-11-23 NOTE — Progress Notes (Signed)
Mobility Specialist Progress Note:   11/23/22 1126  Mobility  Activity Transferred from bed to chair  Level of Assistance Minimal assist, patient does 75% or more  Assistive Device Other (Comment) (HHA)  Range of Motion/Exercises Active  Activity Response Tolerated fair  Mobility Referral Yes  $Mobility charge 1 Mobility   Pt received in bed and agreeable. C/o feeling anxious and arm pain. Pt left in chair with all needs met, call bell in reach, and X-ray tech in room.   Andrey Campanile Mobility Specialist Please contact via SecureChat or  Rehab office at [phone]

## 2022-11-23 NOTE — TOC Progression Note (Signed)
Transition of Care [facility]) - Progression Note    Patient Details  Name: KYESHIA ZINN MRN: [id number] Date of Birth: 08/26/1934  [facility]) CM/SW Contact  Ina Homes, [place] Phone Number: 11/23/2022, 1:10 PM  Clinical Narrative:     SW spoke with pt's son [name], reports he will have an answer in the AM, SW explained pt ready for d/c. [name] verbalized understanding.   Auth started in [employer] portal, will need to update once facility is chosen   Expected Discharge Plan: [place] Barriers to Discharge: Continued Medical Work up  Expected Discharge Plan and Services Expected Discharge Plan: [place]   Discharge Planning Services: CM Consult Post Acute Care Choice: [place] arrangements for the past 2 months: Single Family Home                 DME Arranged: Oxygen DME Agency: [employer] Date DME Agency Contacted: 11/17/22 Time DME Agency Contacted: [phone] Representative spoke with at DME Agency: [place]: PT [place]: [place] ([place]) Date [place]: 11/14/22 Time [place]: 1448 Representative spoke with at [place]: [place] ([place]) Interventions    Readmission Risk Interventions     No data to display

## 2022-11-23 NOTE — Progress Notes (Signed)
PROGRESS NOTE    [name]  TIW:[id number] DOB: [birth year] DOA: 11/11/2022 PCP: [name], FNP   Brief Narrative: 88 year old past medical history significant for CAD, pulmonary hypertension, right subclavian artery stenosis, permanent A-fib, diabetes type 2, hypertension, stage IV low-grade Carcinoid/neuroendocrine presents complaining of worsening shortness of breath on exertion over the last past several months, but worse over the last several days was advised to present to the ED by Dr. Einar Gip. Patient  also reports some chocking episodes with meals. CTA negative for PE, moderate right and small left pleural effusion with associated atelectasis, and newly mildly enlarged mediastinal and right hilar lymph node reactive need CT follow-up.  Numerous bilateral solid pulmonary nodules unchanged from 2018.  Pulmonary consulted, dyspnea, hypoxia multifactorial. Plan to resume diuretics and monitor kidney function.    Assessment & Plan:   Dyspnea on Exertion:  CTA Chest with moderate right pleural effusion.  Received IV lasix times 3 doses. Develops AKI.  Pulmonology consulted for evaluation of Amiodarone toxicity ? , as recommended by Dr Einar Gip.  Dyspnea thought to be multifactorial. pleural effusion, acute on chronic diastolic HF, Low grade lung neuroendocrine tumor.  -Patient qualifies for home oxygen. CM will arrange.  -Chest x ray; BL pleural effusion. Pulm advises resuming lasix when kidney function allows, lasix has been resumed, will monitor renal function - Limits benefit from nebulizer, per pulmonology stop  Acute Hypoxic resp failure:  Multifactorial, secondary to pleural effusion, acute on chronic diastolic HF, Low grade lung neuroendocrine tumor.  Started on Pulmicort nebulizer.  Now stable on 3 liters, will d/c on that Lasix resumed  Hyponatremia.  Still has evidence of some fluid overload continue diuretics with caution, monitor closely.  Placed on free water  restriction.  Serum osmolality, urine osmolality and urine electrolytes ordered.  Pain arising from her neck all the way to her right shoulder arm and wrist - Ct C spine, x-ray right shoulder, right elbow, repeat CT of the right wrist due to questionable right radial styloid fracture on x-ray.  Supportive care for now.  Uric acid level stable.  Tramadol for pain control.  Acute on Chronic Diastolic HF  Presents with dyspnea, pleural effusion, increase BNP.  Lasix held due to AKI. Since resumed   AKI on ckd 3b; In setting diuretics, diarrhea, vomiting.  Lasix held. Received Low rate IV fluids.  Cr peak to 1.8--- down to 1.4-1.6 which appears to be her baseline  Nausea, vomiting:  Dysphagia.  KUB; marked gastric distension. Assessment for Small Bowel obstruction is limited  CT abdomen pelvis negative for Obstruction. SLP notes no significant swallowing dysfunction GI has evaluated and signed off, now is tolerating diet  H/O CAD; S/P PCI Denies chest pain.   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  On Stiolto-- change to Pulmicort by pulmonologist. Followed by pulm as outpt  GERD; PPI changed to BID  Diarrhea;  Acute on chronic. No further diarrhea.   Hypomagnesemia; Replaced.   Hypokalemia; Replaced.   A fib; Continue with Cardizem and metoprolol. And apixaban. Metop dose increased and discontinuation of albuterol seems to have also helped hr  Estimated body mass index is 22.05 kg/m as calculated from the following:   Height as of this encounter: 5' 2.01" (1.575 m).   Weight as of this encounter: 54.7 kg.   DVT prophylaxis: apixaban Code Status: DNR Family Communication: sons updated telephonically 11/23. No answer when either son called today. Disposition Plan:  Status is: Inpatient Remains inpatient  appropriate because: pursuing snf placement   Consultants:  CCM Dr Einar Gip  Procedures:  none  Antimicrobials:    Subjective:  Patient in bed, appears  comfortable, denies any headache, no fever, no chest pain or pressure, no shortness of breath , no abdominal pain. No new focal weakness.  She has pain arising from her neck all the way to her right shoulder arm and wrist   Objective: Vitals:   11/22/22 2253 11/23/22 0451 11/23/22 0739 11/23/22 0743  BP: 103/62 107/68  (!) 117/59  Pulse: 79 89  75  Resp: 20 18  18   Temp: 98.5 F (36.9 C) 98.2 F (36.8 C)  98.1 F (36.7 C)  TempSrc: Oral Oral  Oral  SpO2: 91% (!) 89% 96% 96%  Weight:      Height:        Intake/Output Summary (Last 24 hours) at 11/23/2022 1116 Last data filed at 11/23/2022 0500 Gross per 24 hour  Intake --  Output 1100 ml  Net -1100 ml    Filed Weights   11/13/22 0600  Weight: 54.7 kg    Examination:  Awake Alert, No new F.N deficits, Normal affect [place].AT,PERRAL Supple Neck, No JVD,   Symmetrical Chest wall movement, Good air movement bilaterally, CTAB RRR,No Gallops, Rubs or new Murmurs,  +ve B.Sounds, Abd Soft, No tenderness,   R arm-wrist tender    Data Reviewed: I have personally reviewed following labs and imaging studies  Recent Labs  Lab 11/17/22 0802 11/18/22 0326 11/19/22 0617  WBC 4.7 4.4 5.4  HGB 10.0* 9.9* 10.2*  HCT 30.1* 29.9* 30.0*  PLT 111* 120* 121*  MCV 89.3 88.7 87.7  MCH 29.7 29.4 29.8  MCHC 33.2 33.1 34.0  RDW 13.3 13.3 13.2    Recent Labs  Lab 11/17/22 0802 11/18/22 0326 11/19/22 0617 11/20/22 0142 11/21/22 0220 11/22/22 0240 11/23/22 0253 11/23/22 0817  NA 135 136 133* 130* 130* 131* 128*  --   K 4.4 4.3 4.4 4.5 4.4 4.3 4.1  --   CL 103 101 98 93* 91* 92* 90*  --   CO2 24 25 24 24 27 26 27   --   GLUCOSE 135* 139* 204* 212* 211* 207* 190*  --   BUN 19 15 24* 26* 26* 34* 34*  --   CREATININE 1.47* 1.29* 1.48* 1.57* 1.48* 1.60* 1.36*  --   AST 25  --   --   --   --   --   --   --   ALT 17  --   --   --   --   --   --   --   ALKPHOS 47  --   --   --   --   --   --   --   BILITOT 0.6  --   --   --   --    --   --   --   ALBUMIN 3.0* 3.0*  --   --   --   --   --   --   CRP  --  4.6*  --   --   --   --   --   --   BNP 240.6*  --   --   --   --   --   --  213.3*  CALCIUM 8.5* 8.7* 8.7* 9.0 8.9 9.1 8.8*  --       Recent Labs  Lab 11/17/22 0802 11/18/22 0326 11/19/22 0617 11/20/22 0142  11/21/22 0220 11/22/22 0240 11/23/22 0253 11/23/22 0817  CRP  --  4.6*  --   --   --   --   --   --   BNP 240.6*  --   --   --   --   --   --  213.3*  CALCIUM 8.5* 8.7* 8.7* 9.0 8.9 9.1 8.8*  --     Recent Labs  Lab 11/17/22 0802 11/18/22 0326 11/19/22 0617 11/20/22 0142 11/21/22 0220 11/22/22 0240 11/23/22 0253  WBC 4.7 4.4 5.4  --   --   --   --   PLT 111* 120* 121*  --   --   --   --   CRP  --  4.6*  --   --   --   --   --   CREATININE 1.47* 1.29* 1.48* 1.57* 1.48* 1.60* 1.36*         Radiology Studies: DG Chest Port 1 View  Result Date: 11/23/2022 CLINICAL DATA:  Shortness of breath. EXAM: PORTABLE CHEST 1 VIEW COMPARISON:  11/16/2022 FINDINGS: Cardiomediastinal contours are stable. Aortic atherosclerotic calcifications. Bilateral pleural effusions are unchanged in the interval. Persistent diffuse increase interstitial markings identified compatible with pulmonary edema. New bilateral upper lobe airspace opacities may reflect areas of asymmetric alveolar edema or pneumonia. Bilateral areas of subsegmental atelectasis have increased from previous exam. IMPRESSION: 1. Persistent pulmonary edema and bilateral pleural effusions. 2. New bilateral upper lobe airspace opacities may reflect areas of asymmetric alveolar edema or pneumonia. 3. Progressive bilateral areas of subsegmental atelectasis. Electronically Signed   By: Kerby Moors M.D.   On: 11/23/2022 07:17   VAS [place] UPPER EXTREMITY VENOUS DUPLEX  Result Date: 11/22/2022 UPPER VENOUS STUDY  Patient Name:  [name]  Date of Exam:   11/22/2022 Medical Rec #: [id number]         Accession #:    [id number] Date of Birth: [birth year]          Patient Gender: F Patient Age:   82 years Exam Location:  [facility] Procedure:      VAS [place] UPPER EXTREMITY VENOUS DUPLEX Referring Phys: Laurey Arrow --------------------------------------------------------------------------------  Indications: Erythema, and Pain Risk Factors: None identified. Comparison Study: No prior studies. Performing Technologist: Oliver Hum RVT  Examination Guidelines: A complete evaluation includes B-mode imaging, spectral Doppler, color Doppler, and power Doppler as needed of all accessible portions of each vessel. Bilateral testing is considered an integral part of a complete examination. Limited examinations for reoccurring indications may be performed as noted.  Right Findings: +----------+------------+---------+-----------+----------+-------+ RIGHT     CompressiblePhasicitySpontaneousPropertiesSummary +----------+------------+---------+-----------+----------+-------+ IJV           Full       Yes       Yes                      +----------+------------+---------+-----------+----------+-------+ Subclavian    Full       Yes       Yes                      +----------+------------+---------+-----------+----------+-------+ Axillary      Full       Yes       Yes                      +----------+------------+---------+-----------+----------+-------+ Brachial      Full       Yes  Yes                      +----------+------------+---------+-----------+----------+-------+ Radial        Full                                          +----------+------------+---------+-----------+----------+-------+ Ulnar         Full                                          +----------+------------+---------+-----------+----------+-------+ Cephalic      Full                                          +----------+------------+---------+-----------+----------+-------+ Basilic       Full                                           +----------+------------+---------+-----------+----------+-------+  Left Findings: +----------+------------+---------+-----------+----------+-------+ LEFT      CompressiblePhasicitySpontaneousPropertiesSummary +----------+------------+---------+-----------+----------+-------+ Subclavian    Full       Yes       Yes                      +----------+------------+---------+-----------+----------+-------+  Summary:  Right: No evidence of deep vein thrombosis in the upper extremity. No evidence of superficial vein thrombosis in the upper extremity.  Left: No evidence of thrombosis in the subclavian.  *See table(s) above for measurements and observations.  Diagnosing physician: Harold Barban MD Electronically signed by Harold Barban MD on 11/22/2022 at 8:44:22 PM.    Final    DG Wrist 2 Views Right  Result Date: 11/22/2022 CLINICAL DATA:  Right wrist pain. EXAM: RIGHT WRIST - 2 VIEW COMPARISON:  None Available. FINDINGS: Linear lucency at the base of the radial styloid raises the question of nondisplaced radial styloid fracture. Heterogeneous mineralization in the distal radius and ulna is compatible with osteopenia and similar in appearance to contralateral wrist x-ray of 03/15/2016. Calcification of the TFCC noted. IMPRESSION: 1. Linear lucency at the base of the radial styloid raises the question of nondisplaced radial styloid fracture. CT or MRI could be used to further evaluate as clinically warranted. 2. Osteopenia. Electronically Signed   By: Misty Stanley M.D.   On: 11/22/2022 17:37        Scheduled Meds:  apixaban  2.5 mg Oral BID   budesonide (PULMICORT) nebulizer solution  0.5 mg Nebulization BID   diltiazem  180 mg Oral Daily   feeding supplement  237 mL Oral BID BM   insulin aspart  0-9 Units Subcutaneous TID WC   insulin glargine-yfgn  15 Units Subcutaneous QHS   ipratropium  0.5 mg Nebulization BID   levothyroxine  88 mcg Oral Q0600   metoprolol succinate  100 mg Oral Daily    multivitamin with minerals  1 tablet Oral Daily   ondansetron  4 mg Oral Q8H   pantoprazole  40 mg Oral Daily   rosuvastatin  20 mg Oral Daily   traMADol  50 mg Oral Once   Continuous Infusions:   LOS:  10 days   Signature  -    Lala Lund M.D on 11/23/2022 at 11:16 AM   -  To page go to [email or web address]

## 2022-11-24 DIAGNOSIS — Z515 Encounter for palliative care: Secondary | ICD-10-CM | POA: Diagnosis not present

## 2022-11-24 DIAGNOSIS — E43 Unspecified severe protein-calorie malnutrition: Secondary | ICD-10-CM | POA: Diagnosis not present

## 2022-11-24 DIAGNOSIS — R54 Age-related physical debility: Secondary | ICD-10-CM | POA: Diagnosis not present

## 2022-11-24 DIAGNOSIS — R531 Weakness: Secondary | ICD-10-CM | POA: Diagnosis not present

## 2022-11-24 DIAGNOSIS — R0902 Hypoxemia: Secondary | ICD-10-CM | POA: Diagnosis not present

## 2022-11-24 LAB — HEPATIC FUNCTION PANEL
ALT: 20 U/L (ref 0–44)
AST: 26 U/L (ref 15–41)
Albumin: 2.6 g/dL — ABNORMAL LOW (ref 3.5–5.0)
Alkaline Phosphatase: 66 U/L (ref 38–126)
Bilirubin, Direct: 0.2 mg/dL (ref 0.0–0.2)
Indirect Bilirubin: 0.6 mg/dL (ref 0.3–0.9)
Total Bilirubin: 0.8 mg/dL (ref 0.3–1.2)
Total Protein: 5.8 g/dL — ABNORMAL LOW (ref 6.5–8.1)

## 2022-11-24 LAB — CBC WITH DIFFERENTIAL/PLATELET
Abs Immature Granulocytes: 0.06 10*3/uL (ref 0.00–0.07)
Basophils Absolute: 0 10*3/uL (ref 0.0–0.1)
Basophils Relative: 1 %
Eosinophils Absolute: 0 10*3/uL (ref 0.0–0.5)
Eosinophils Relative: 1 %
HCT: 25.6 % — ABNORMAL LOW (ref 36.0–46.0)
Hemoglobin: 9.2 g/dL — ABNORMAL LOW (ref 12.0–15.0)
Immature Granulocytes: 1 %
Lymphocytes Relative: 21 %
Lymphs Abs: 1.2 10*3/uL (ref 0.7–4.0)
MCH: 29.8 pg (ref 26.0–34.0)
MCHC: 35.9 g/dL (ref 30.0–36.0)
MCV: 82.8 fL (ref 80.0–100.0)
Monocytes Absolute: 1.2 10*3/uL — ABNORMAL HIGH (ref 0.1–1.0)
Monocytes Relative: 21 %
Neutro Abs: 3.3 10*3/uL (ref 1.7–7.7)
Neutrophils Relative %: 55 %
Platelets: 202 10*3/uL (ref 150–400)
RBC: 3.09 MIL/uL — ABNORMAL LOW (ref 3.87–5.11)
RDW: 13 % (ref 11.5–15.5)
WBC: 5.8 10*3/uL (ref 4.0–10.5)
nRBC: 0 % (ref 0.0–0.2)

## 2022-11-24 LAB — BASIC METABOLIC PANEL
Anion gap: 11 (ref 5–15)
BUN: 43 mg/dL — ABNORMAL HIGH (ref 8–23)
CO2: 28 mmol/L (ref 22–32)
Calcium: 8.5 mg/dL — ABNORMAL LOW (ref 8.9–10.3)
Chloride: 87 mmol/L — ABNORMAL LOW (ref 98–111)
Creatinine, Ser: 1.59 mg/dL — ABNORMAL HIGH (ref 0.44–1.00)
GFR, Estimated: 31 mL/min — ABNORMAL LOW (ref >60)
Glucose, Bld: 186 mg/dL — ABNORMAL HIGH (ref 70–99)
Potassium: 4 mmol/L (ref 3.5–5.1)
Sodium: 126 mmol/L — ABNORMAL LOW (ref 135–145)

## 2022-11-24 LAB — MAGNESIUM: Magnesium: 1.4 mg/dL — ABNORMAL LOW (ref 1.7–2.4)

## 2022-11-24 LAB — GLUCOSE, CAPILLARY
Glucose-Capillary: 212 mg/dL — ABNORMAL HIGH (ref 70–99)
Glucose-Capillary: 235 mg/dL — ABNORMAL HIGH (ref 70–99)
Glucose-Capillary: 156 mg/dL — ABNORMAL HIGH (ref 70–99)
Glucose-Capillary: 171 mg/dL — ABNORMAL HIGH (ref 70–99)

## 2022-11-24 LAB — C-REACTIVE PROTEIN: CRP: 10 mg/dL — ABNORMAL HIGH (ref <1.0)

## 2022-11-24 LAB — BRAIN NATRIURETIC PEPTIDE: B Natriuretic Peptide: 301.1 pg/mL — ABNORMAL HIGH (ref 0.0–100.0)

## 2022-11-24 MED ORDER — IPRATROPIUM-ALBUTEROL 0.5-2.5 (3) MG/3ML IN SOLN
3.0000 mL | Freq: Four times a day (QID) | RESPIRATORY_TRACT | Status: DC | PRN
  Administered 2022-11-26: 3 mL via RESPIRATORY_TRACT
  Filled 2022-11-24: qty 3

## 2022-11-24 MED ORDER — TOLVAPTAN 15 MG PO TABS
15.0000 mg | ORAL_TABLET | Freq: Once | ORAL | Status: AC
  Administered 2022-11-24: 15 mg via ORAL
  Filled 2022-11-24: qty 1

## 2022-11-24 MED ORDER — ONDANSETRON HCL 4 MG/2ML IJ SOLN
4.0000 mg | Freq: Four times a day (QID) | INTRAMUSCULAR | Status: DC | PRN
  Administered 2022-11-24 (×2): 4 mg via INTRAVENOUS
  Filled 2022-11-24 (×2): qty 2

## 2022-11-24 MED ORDER — MAGNESIUM SULFATE 4 GM/100ML IV SOLN
4.0000 g | Freq: Once | INTRAVENOUS | Status: AC
  Administered 2022-11-24: 4 g via INTRAVENOUS
  Filled 2022-11-24: qty 100

## 2022-11-24 NOTE — Progress Notes (Signed)
Patient ID: [name], female   DOB: 1934-07-27, 88 y.o.   MRN: [id number]    Progress Note from the Palliative Medicine Team at [facility]   Patient Name: [name]        Date: 11/24/2022 DOB: 1934/06/19  Age: 88 y.o. MRN#: [id number] Attending Physician: Thurnell Lose, MD Primary Care Physician: [name], FNP Admit Date: 11/11/2022   Medical records reviewed   88 y.o. female   admitted on 11/11/2022 with   past medical history significant for CAD, pulmonary hypertension, right subclavian artery stenosis, permanent A-fib, diabetes type 2, hypertension, stage IV low-grade Carcinoid/neuroendocrine presents complaining of worsening shortness of breath on exertion over the last past several months, but worse over the last several days was advised to present to the ED by Dr. Einar Gip.    CTA negative for PE, moderate right and small left pleural effusion with associated atelectasis, and newly mildly enlarged mediastinal and right hilar lymph node reactive need CT follow-up.  Numerous bilateral solid pulmonary nodules unchanged from 2018.  Day 13 of this hospitalization,  patient has had gradual improvements.  Discussion around next steps in transition of care.  Patient and family are open to short-term rehab as they begin to plan for anticipatory care needs into the future.   Son is searching options   This NP assessed patient at the bedside as a follow up for palliative medicine needs and emotional support.  Patient is alert and oriented and pleasant with conversation.  She remains debilitated and with generalized weakness.   Education offered on the importance of continued work with physical therapy, dietary intake and overall health and wellness.  Education offered today regarding  the importance of continued conversation with her family and their  medical providers regarding overall plan of care and treatment options,  ensuring decisions are within the context  of the patients values and GOCs.  I spoke to son/ [name].  I received ACP documents and scanned into the system   35 minutes -greater than 50% of the time was spent in counseling and coordination of care  PMT will continue to support holistically   Wadie Lessen NP  Palliative Medicine Team Team Phone # [phone] Pager [phone]

## 2022-11-24 NOTE — Care Management Important Message (Signed)
Important Message  Patient Details  Name: [name] MRN: [id number] Date of Birth: 03-02-1934   Medicare Important Message Given:  Yes     Orbie Pyo 11/24/2022, 3:27 PM

## 2022-11-24 NOTE — Progress Notes (Addendum)
PROGRESS NOTE    [name]  SWF:[id number] DOB: 01/29/1934 DOA: 11/11/2022 PCP: [name], FNP   Brief Narrative: 88 year old past medical history significant for CAD, pulmonary hypertension, right subclavian artery stenosis, permanent A-fib, diabetes type 2, hypertension, stage IV low-grade Carcinoid/neuroendocrine presents complaining of worsening shortness of breath on exertion over the last past several months, but worse over the last several days was advised to present to the ED by Dr. Einar Gip. Patient  also reports some chocking episodes with meals. CTA negative for PE, moderate right and small left pleural effusion with associated atelectasis, and newly mildly enlarged mediastinal and right hilar lymph node reactive need CT follow-up.  Numerous bilateral solid pulmonary nodules unchanged from 2018.  Pulmonary consulted, dyspnea, hypoxia multifactorial. Plan to resume diuretics and monitor kidney function.    Assessment & Plan:   Dyspnea on Exertion: With acute hypoxic respiratory failure.  Due to combination of due to on chronic diastolic CHF EF of > 22%, mild pleural effusions and underlying history of low-grade lung neuroendocrine tumor. CTA Chest with moderate right pleural effusion.  Has been diuresed, seen by pulmonary, shortness of breath improved.  Also has underlying history of low-grade lung neuroendocrine tumor.  Currently stable on 1 to 2 L nasal cannula oxygen and relatively symptom-free for now.  Continue nebulizer treatments and oxygen as needed.  Hyponatremia.  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  Pain arising from her neck all the way to her right shoulder arm and wrist - stable Ct C spine, x-ray right shoulder, right elbow, CT of the right wrist with supportive care pain much improved, she had  stable uric acid levels as well.  Likely musculoskeletal in nature.  Acute on Chronic Diastolic HF - has been diuresed continue to monitor.  See #1 above.   Paroxysmal atrial fibrillation with [place] vas 2 score of > 4  ; Continue with Cardizem, metoprolol and Eliquis  H/O CAD; S/P PCI - Denies chest pain.   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  On Stiolto-- change to Pulmicort by pulmonologist. Followed by pulm as outpt  AKI on ckd 3b;  Cr peak to 1.8--- down to 1.4-1.6 which appears to be her baseline continue to monitor.  Nausea, vomiting: Dysphagia.  Stable CT scan, seen by GI and speech, now tolerating oral diet.   GERD; PPI changed to BID  Diarrhea;  Acute on chronic. No further diarrhea.   Hypomagnesemia; Replaced.   Hypokalemia; Replaced.     Estimated body mass index is 22.05 kg/m as calculated from the following:   Height as of this encounter: 5' 2.01" (1.575 m).   Weight as of this encounter: 54.7 kg.   DVT prophylaxis: apixaban Code Status: DNR Family Communication: Called son [name] on 11/24/2022 at 8:50 AM message left on his cell phone [phone]  Disposition Plan:  Status is: Inpatient Remains inpatient appropriate because: pursuing snf placement   Consultants:  CCM Dr Einar Gip  Procedures:  none  Antimicrobials:    Subjective:   Patient in bed, appears comfortable, denies any headache, no fever, no chest pain or pressure, no shortness of breath , no abdominal pain. No new focal weakness.Improved R arm pain.  Objective: Vitals:   11/24/22 0440 11/24/22 0733 11/24/22 0750 11/24/22 0751  BP: 98/62 106/63    Pulse: 87 71    Resp:  18 19    Temp: 97.8 F (36.6 C) 97.7 F (36.5 C)    TempSrc: Oral Oral    SpO2: 90% 90% 90% 91%  Weight:      Height:        Intake/Output Summary (Last 24 hours) at 11/24/2022 0847 Last data filed at 11/24/2022 0522 Gross per 24 hour  Intake --  Output 1200 ml  Net -1200 ml    Filed Weights   11/13/22  0600  Weight: 54.7 kg    Examination:  Awake Alert, No new F.N deficits, Normal affect [place].AT,PERRAL Supple Neck, No JVD,   Symmetrical Chest wall movement, Good air movement bilaterally, few rales RRR,No Gallops, Rubs or new Murmurs,  +ve B.Sounds, Abd Soft, No tenderness,   Trace edema   Data Reviewed: I have personally reviewed following labs and imaging studies  Recent Labs  Lab 11/18/22 0326 11/19/22 0617 11/23/22 1453 11/24/22 0323  WBC 4.4 5.4 7.1 5.8  HGB 9.9* 10.2* 9.4* 9.2*  HCT 29.9* 30.0* 26.9* 25.6*  PLT 120* 121* 199 202  MCV 88.7 87.7 84.3 82.8  MCH 29.4 29.8 29.5 29.8  MCHC 33.1 34.0 34.9 35.9  RDW 13.3 13.2 12.7 13.0  LYMPHSABS  --   --   --  1.2  MONOABS  --   --   --  1.2*  EOSABS  --   --   --  0.0  BASOSABS  --   --   --  0.0    Recent Labs  Lab 11/18/22 0326 11/19/22 0617 11/20/22 0142 11/21/22 0220 11/22/22 0240 11/23/22 0253 11/23/22 0817 11/23/22 1453 11/24/22 0323  NA 136   < > 130* 130* 131* 128*  --   --  126*  K 4.3   < > 4.5 4.4 4.3 4.1  --   --  4.0  CL 101   < > 93* 91* 92* 90*  --   --  87*  CO2 25   < > 24 27 26 27   --   --  28  GLUCOSE 139*   < > 212* 211* 207* 190*  --   --  186*  BUN 15   < > 26* 26* 34* 34*  --   --  43*  CREATININE 1.29*   < > 1.57* 1.48* 1.60* 1.36*  --   --  1.59*  ALBUMIN 3.0*  --   --   --   --   --   --   --   --   CRP 4.6*  --   --   --   --   --   --  10.7* 10.0*  BNP  --   --   --   --   --   --  213.3*  --  301.1*  MG  --   --   --   --   --   --   --   --  1.4*  CALCIUM 8.7*   < > 9.0 8.9 9.1 8.8*  --   --  8.5*   < > = values in this interval not displayed.     Radiology Studies: CT WRIST RIGHT WO CONTRAST  Result Date: 11/23/2022 CLINICAL DATA:  Wrist pain. EXAM: CT OF THE RIGHT WRIST WITHOUT CONTRAST TECHNIQUE: Multidetector CT imaging of the right wrist was performed according to the standard protocol. Multiplanar CT image reconstructions were also generated. RADIATION DOSE  REDUCTION: This exam was performed according to the departmental dose-optimization  program which includes automated exposure control, adjustment of the mA and/or kV according to patient size and/or use of iterative reconstruction technique. COMPARISON:  Radiographs 11/22/2022 FINDINGS: Moderate wrist joint degenerative changes with joint space narrowing, subchondral cystic change and chondrocalcinosis. No acute fracture. No evidence of AVN. IMPRESSION: 1. Moderate wrist joint degenerative changes with chondrocalcinosis. 2. No acute fracture. Electronically Signed   By: Marijo Sanes M.D.   On: 11/23/2022 13:17   CT CERVICAL SPINE WO CONTRAST  Result Date: 11/23/2022 CLINICAL DATA:  Neck and right arm pain. EXAM: CT CERVICAL SPINE WITHOUT CONTRAST TECHNIQUE: Multidetector CT imaging of the cervical spine was performed without intravenous contrast. Multiplanar CT image reconstructions were also generated. RADIATION DOSE REDUCTION: This exam was performed according to the departmental dose-optimization program which includes automated exposure control, adjustment of the mA and/or kV according to patient size and/or use of iterative reconstruction technique. COMPARISON:  CT scan from 1,013 FINDINGS: Examination limited by patient motion. Alignment: Grossly normal alignment. Skull base and vertebrae: No acute cervical spine fracture. Progressive significant C1-2 degenerative changes with pannus formation. Soft tissues and spinal canal: No prevertebral fluid or swelling. No visible canal hematoma. Disc levels: The spinal canal is fairly generous. No obvious large disc protrusions or spinal stenosis. Multilevel foraminal stenosis due to uncinate spurring facet disease. Upper chest: Emphysematous changes are noted along with bilateral pleural effusions. Other: No neck mass, adenopathy or hematoma. Bilateral carotid artery calcifications. IMPRESSION: 1. Limited examination due to patient motion. 2. No acute cervical  spine fracture. 3. Progressive significant C1-2 degenerative changes with pannus formation. 4. Mild multilevel foraminal stenosis due to uncinate spurring and facet disease. 5. Bilateral pleural effusions. Electronically Signed   By: Marijo Sanes M.D.   On: 11/23/2022 13:14   DG Elbow 2 Views Right  Result Date: 11/23/2022 CLINICAL DATA:  Elbow pain. EXAM: RIGHT ELBOW - 2 VIEW COMPARISON:  None Available. FINDINGS: Two-view exam shows no fracture or dislocation. Lateral film reveals no fat pad elevation to suggest joint effusion. No worrisome lytic or sclerotic osseous abnormality. IMPRESSION: Negative. Electronically Signed   By: Misty Stanley M.D.   On: 11/23/2022 11:51   DG Shoulder Right Port  Result Date: 11/23/2022 CLINICAL DATA:  Shoulder pain. EXAM: RIGHT SHOULDER - 1 VIEW COMPARISON:  None Available. FINDINGS: Bones are markedly demineralized. No evidence for an acute fracture or dislocation. Degenerative changes are seen in the glenohumeral joint and in the acromioclavicular joint. No worrisome lytic or sclerotic osseous abnormality. Changes in the right lung better evaluated on chest x-ray performed earlier today. IMPRESSION: Degenerative changes without acute bony findings. Electronically Signed   By: Misty Stanley M.D.   On: 11/23/2022 11:48   DG Chest Port 1 View  Result Date: 11/23/2022 CLINICAL DATA:  Shortness of breath. EXAM: PORTABLE CHEST 1 VIEW COMPARISON:  11/16/2022 FINDINGS: Cardiomediastinal contours are stable. Aortic atherosclerotic calcifications. Bilateral pleural effusions are unchanged in the interval. Persistent diffuse increase interstitial markings identified compatible with pulmonary edema. New bilateral upper lobe airspace opacities may reflect areas of asymmetric alveolar edema or pneumonia. Bilateral areas of subsegmental atelectasis have increased from previous exam. IMPRESSION: 1. Persistent pulmonary edema and bilateral pleural effusions. 2. New bilateral  upper lobe airspace opacities may reflect areas of asymmetric alveolar edema or pneumonia. 3. Progressive bilateral areas of subsegmental atelectasis. Electronically Signed   By: Kerby Moors M.D.   On: 11/23/2022 07:17   VAS [place] UPPER EXTREMITY VENOUS DUPLEX  Result Date: 11/22/2022 UPPER VENOUS STUDY  Patient Name:  [name]  Date of Exam:   11/22/2022 Medical Rec #: [id number]         Accession #:    [id number] Date of Birth: 04/09/1934         Patient Gender: F Patient Age:   63 years Exam Location:  [facility] Procedure:      VAS [place] UPPER EXTREMITY VENOUS DUPLEX Referring Phys: Laurey Arrow --------------------------------------------------------------------------------  Indications: Erythema, and Pain Risk Factors: None identified. Comparison Study: No prior studies. Performing Technologist: Oliver Hum RVT  Examination Guidelines: A complete evaluation includes B-mode imaging, spectral Doppler, color Doppler, and power Doppler as needed of all accessible portions of each vessel. Bilateral testing is considered an integral part of a complete examination. Limited examinations for reoccurring indications may be performed as noted.  Right Findings: +----------+------------+---------+-----------+----------+-------+ RIGHT     CompressiblePhasicitySpontaneousPropertiesSummary +----------+------------+---------+-----------+----------+-------+ IJV           Full       Yes       Yes                      +----------+------------+---------+-----------+----------+-------+ Subclavian    Full       Yes       Yes                      +----------+------------+---------+-----------+----------+-------+ Axillary      Full       Yes       Yes                      +----------+------------+---------+-----------+----------+-------+ Brachial      Full       Yes       Yes                      +----------+------------+---------+-----------+----------+-------+ Radial        Full                                           +----------+------------+---------+-----------+----------+-------+ Ulnar         Full                                          +----------+------------+---------+-----------+----------+-------+ Cephalic      Full                                          +----------+------------+---------+-----------+----------+-------+ Basilic       Full                                          +----------+------------+---------+-----------+----------+-------+  Left Findings: +----------+------------+---------+-----------+----------+-------+ LEFT      CompressiblePhasicitySpontaneousPropertiesSummary +----------+------------+---------+-----------+----------+-------+ Subclavian    Full       Yes       Yes                      +----------+------------+---------+-----------+----------+-------+  Summary:  Right: No evidence of deep vein thrombosis in the upper extremity. No evidence of superficial  vein thrombosis in the upper extremity.  Left: No evidence of thrombosis in the subclavian.  *See table(s) above for measurements and observations.  Diagnosing physician: Harold Barban MD Electronically signed by Harold Barban MD on 11/22/2022 at 8:44:22 PM.    Final    DG Wrist 2 Views Right  Result Date: 11/22/2022 CLINICAL DATA:  Right wrist pain. EXAM: RIGHT WRIST - 2 VIEW COMPARISON:  None Available. FINDINGS: Linear lucency at the base of the radial styloid raises the question of nondisplaced radial styloid fracture. Heterogeneous mineralization in the distal radius and ulna is compatible with osteopenia and similar in appearance to contralateral wrist x-ray of 03/15/2016. Calcification of the TFCC noted. IMPRESSION: 1. Linear lucency at the base of the radial styloid raises the question of nondisplaced radial styloid fracture. CT or MRI could be used to further evaluate as clinically warranted. 2. Osteopenia. Electronically Signed   By: Misty Stanley  M.D.   On: 11/22/2022 17:37    Scheduled Meds:  apixaban  2.5 mg Oral BID   budesonide (PULMICORT) nebulizer solution  0.5 mg Nebulization BID   diltiazem  180 mg Oral Daily   feeding supplement  237 mL Oral BID BM   insulin aspart  0-9 Units Subcutaneous TID WC   insulin glargine-yfgn  15 Units Subcutaneous QHS   ipratropium  0.5 mg Nebulization BID   levothyroxine  88 mcg Oral Q0600   metoprolol succinate  100 mg Oral Daily   multivitamin with minerals  1 tablet Oral Daily   ondansetron  4 mg Oral Q8H   pantoprazole  40 mg Oral Daily   rosuvastatin  20 mg Oral Daily   tolvaptan  15 mg Oral Once   Continuous Infusions:   LOS: 11 days   Signature  -    Lala Lund M.D on 11/24/2022 at 8:47 AM   -  To page go to [email or web address]

## 2022-11-24 NOTE — Inpatient Diabetes Management (Signed)
Inpatient Diabetes Program Recommendations  AACE/ADA: New Consensus Statement on Inpatient Glycemic Control (2015)  Target Ranges:  Prepandial:   less than 140 mg/dL      Peak postprandial:   less than 180 mg/dL (1-2 hours)      Critically ill patients:  140 - 180 mg/dL   Lab Results  Component Value Date   GLUCAP 212 (H) 11/24/2022   HGBA1C 5.8 (H) 11/12/2022    Review of Glycemic Control  Latest Reference Range & Units 11/23/22 07:48 11/23/22 14:42 11/23/22 17:00 11/23/22 21:19 11/24/22 07:46  Glucose-Capillary 70 - 99 mg/dL 187 (H) 206 (H) 268 (H) 271 (H) 212 (H)   Diabetes history: DM 2 Outpatient Diabetes medications: Tresiba 8 units, Januvia 50 mg Daily Current orders for Inpatient glycemic control:  Semglee 15 units qhs Novolog 0-9 units tid   Ensure Enlive bid between meals  Inpatient Diabetes Program Recommendations:    Glucose in 200 range. Pt may benefit from meal coverage insulin to cover postprandial spikes related to supplement intake. Dr. Candiss Norse to assess on rounds.  Thanks,  Tama Headings RN, MSN, BC-ADM Inpatient Diabetes Coordinator Team Pager [phone] (8a-5p)

## 2022-11-24 NOTE — TOC Progression Note (Signed)
Transition of Care [facility]) - Progression Note    Patient Details  Name: [name] MRN: [id number] Date of Birth: [birth year]  [facility]) CM/SW [place], LCSW Phone Number: 11/24/2022, 4:22 PM  Clinical Narrative:    9:17am- CSW contacted patient's son, [name], but it went to voicemail. CSW contacted patient's other son, Remo Lipps, and he stated he would try to get [name] to contact CSW as he was unsure which facility they had picked.   4pm-CSW visited with patient but she has not been feeling well today and was going to see if she could tolerate her dinner tray.  4:23pm-CSW left voicemail for [facility].     Expected Discharge Plan: [place] Barriers to Discharge: Continued Medical Work up  Expected Discharge Plan and Services Expected Discharge Plan: [place]   Discharge Planning Services: CM Consult Post Acute Care Choice: [place] arrangements for the past 2 months: Single Family Home                 DME Arranged: Oxygen DME Agency: [employer] Date DME Agency Contacted: 11/17/22 Time DME Agency Contacted: [id number] Representative spoke with at DME Agency: [employer]: PT [place]: [place] (Why) Date [place]: 11/14/22 Time [place]: 1448 Representative spoke with at [place]: [place] ([place]) Interventions    Readmission Risk Interventions     No data to display

## 2022-11-25 DIAGNOSIS — R0902 Hypoxemia: Secondary | ICD-10-CM | POA: Diagnosis not present

## 2022-11-25 LAB — GLUCOSE, CAPILLARY
Glucose-Capillary: 126 mg/dL — ABNORMAL HIGH (ref 70–99)
Glucose-Capillary: 128 mg/dL — ABNORMAL HIGH (ref 70–99)
Glucose-Capillary: 219 mg/dL — ABNORMAL HIGH (ref 70–99)
Glucose-Capillary: 209 mg/dL — ABNORMAL HIGH (ref 70–99)
Glucose-Capillary: 148 mg/dL — ABNORMAL HIGH (ref 70–99)

## 2022-11-25 LAB — CBC WITH DIFFERENTIAL/PLATELET
Abs Immature Granulocytes: 0.06 10*3/uL (ref 0.00–0.07)
Basophils Absolute: 0 10*3/uL (ref 0.0–0.1)
Basophils Relative: 0 %
Eosinophils Absolute: 0 10*3/uL (ref 0.0–0.5)
Eosinophils Relative: 0 %
HCT: 26.4 % — ABNORMAL LOW (ref 36.0–46.0)
Hemoglobin: 9 g/dL — ABNORMAL LOW (ref 12.0–15.0)
Immature Granulocytes: 1 %
Lymphocytes Relative: 21 %
Lymphs Abs: 0.9 10*3/uL (ref 0.7–4.0)
MCH: 28.9 pg (ref 26.0–34.0)
MCHC: 34.1 g/dL (ref 30.0–36.0)
MCV: 84.9 fL (ref 80.0–100.0)
Monocytes Absolute: 0.7 10*3/uL (ref 0.1–1.0)
Monocytes Relative: 15 %
Neutro Abs: 2.8 10*3/uL (ref 1.7–7.7)
Neutrophils Relative %: 63 %
Platelets: 229 10*3/uL (ref 150–400)
RBC: 3.11 MIL/uL — ABNORMAL LOW (ref 3.87–5.11)
RDW: 13.1 % (ref 11.5–15.5)
WBC: 4.5 10*3/uL (ref 4.0–10.5)
nRBC: 0 % (ref 0.0–0.2)

## 2022-11-25 LAB — COMPREHENSIVE METABOLIC PANEL
ALT: 24 U/L (ref 0–44)
AST: 31 U/L (ref 15–41)
Albumin: 2.7 g/dL — ABNORMAL LOW (ref 3.5–5.0)
Alkaline Phosphatase: 58 U/L (ref 38–126)
Anion gap: 16 — ABNORMAL HIGH (ref 5–15)
BUN: 48 mg/dL — ABNORMAL HIGH (ref 8–23)
CO2: 28 mmol/L (ref 22–32)
Calcium: 9 mg/dL (ref 8.9–10.3)
Chloride: 86 mmol/L — ABNORMAL LOW (ref 98–111)
Creatinine, Ser: 1.59 mg/dL — ABNORMAL HIGH (ref 0.44–1.00)
GFR, Estimated: 31 mL/min — ABNORMAL LOW (ref >60)
Glucose, Bld: 150 mg/dL — ABNORMAL HIGH (ref 70–99)
Potassium: 3.9 mmol/L (ref 3.5–5.1)
Sodium: 130 mmol/L — ABNORMAL LOW (ref 135–145)
Total Bilirubin: 0.6 mg/dL (ref 0.3–1.2)
Total Protein: 6 g/dL — ABNORMAL LOW (ref 6.5–8.1)

## 2022-11-25 LAB — BRAIN NATRIURETIC PEPTIDE: B Natriuretic Peptide: 403.4 pg/mL — ABNORMAL HIGH (ref 0.0–100.0)

## 2022-11-25 LAB — MAGNESIUM: Magnesium: 2.6 mg/dL — ABNORMAL HIGH (ref 1.7–2.4)

## 2022-11-25 LAB — C-REACTIVE PROTEIN: CRP: 8.5 mg/dL — ABNORMAL HIGH (ref <1.0)

## 2022-11-25 MED ORDER — SODIUM CHLORIDE 0.9 % IV SOLN
12.5000 mg | Freq: Once | INTRAVENOUS | Status: AC
  Administered 2022-11-25: 12.5 mg via INTRAVENOUS
  Filled 2022-11-25: qty 12.5

## 2022-11-25 MED ORDER — METOPROLOL SUCCINATE ER 25 MG PO TB24
25.0000 mg | ORAL_TABLET | Freq: Every day | ORAL | Status: DC
  Administered 2022-11-26: 25 mg via ORAL
  Filled 2022-11-25: qty 1

## 2022-11-25 MED ORDER — DILTIAZEM HCL ER COATED BEADS 120 MG PO CP24
120.0000 mg | ORAL_CAPSULE | Freq: Every day | ORAL | Status: DC
  Administered 2022-11-26: 120 mg via ORAL
  Filled 2022-11-25: qty 1

## 2022-11-25 MED ORDER — TOLVAPTAN 15 MG PO TABS
15.0000 mg | ORAL_TABLET | Freq: Once | ORAL | Status: AC
  Administered 2022-11-25: 15 mg via ORAL
  Filled 2022-11-25: qty 1

## 2022-11-25 MED ORDER — LACTATED RINGERS IV SOLN: INTRAVENOUS | Status: AC

## 2022-11-25 NOTE — Progress Notes (Signed)
Nutrition Follow-up  DOCUMENTATION CODES:  Severe malnutrition in context of chronic illness  INTERVENTION:  -Continue Ensure Enlive BID to provide 350kcal and 20g protein/bottle -Consider liberalizing diet to regular if average intake <75% -Continue daily MVI -Obtain new weight as feasible  NUTRITION DIAGNOSIS:  Severe Malnutrition related to chronic illness (esophageal dysmotility) as evidenced by severe fat depletion, severe muscle depletion, energy intake < 75% for > or equal to 1 month. -po intake improved  GOAL:  Patient will meet greater than or equal to 90% of their needs -progressing  MONITOR:  PO intake, Supplement acceptance, Diet advancement  REASON FOR ASSESSMENT:  Consult Poor PO, Assessment of nutrition requirement/status  ASSESSMENT:  Pt is an 88yo F with PMH of stroke, T2DM, CAD, hypothyroid, HTN, anemia, diverticulitis, and lung cancer who presents with n/v found to have severe dysmotility.  Pt being treated for SIADH, received dose of Samsca with improvement in Na. Second dose planned for today. Diet history: 11/22: Soft 11/25: Carb Modified w/ 1228mL fluid restriction 11/28: Carb Modified without fluid restriction  Pt is accepting 1-2 Ensure Enlive supplements/day to provide 350kcal and 20g protein/bottle. Attempted visit with pt at bedside this afternoon but she was with other medical staff. No po documentation available at this time. Pt with nausea yesterday, but appears improved today.   Labs reviewed: Na:130, BG:150, BUN:48, Cr:1.59, Mg:2.6, GFR:31, BNP:403.4, CRP:8.5, Albumin:2.7  Diet Order:   Diet Order             Diet Carb Modified Fluid consistency: Thin; Room service appropriate? No  Diet effective now                   EDUCATION NEEDS:  Not appropriate for education at this time  Skin:  Skin Assessment: Reviewed RN Assessment   Height:  Ht Readings from Last 1 Encounters:  11/13/22 5' 2.01" (1.575 m)   Weight:  Wt Readings  from Last 1 Encounters:  11/13/22 54.7 kg   BMI:  Body mass index is 22.05 kg/m.  Estimated Nutritional Needs:  Kcal:  1640-1915kcal Protein:  65-85g Fluid:  1640-19101mL  Candise Bowens, MS, RD, LDN, CNSC See AMiON for contact information

## 2022-11-25 NOTE — Progress Notes (Signed)
PROGRESS NOTE    [name]  NFA:[id number] DOB: 09-May-1934 DOA: 11/11/2022 PCP: [name], FNP   Brief Narrative: 88 year old past medical history significant for CAD, pulmonary hypertension, right subclavian artery stenosis, permanent A-fib, diabetes type 2, hypertension, stage IV low-grade Carcinoid/neuroendocrine presents complaining of worsening shortness of breath on exertion over the last past several months, but worse over the last several days was advised to present to the ED by Dr. Einar Gip. Patient  also reports some chocking episodes with meals. CTA negative for PE, moderate right and small left pleural effusion with associated atelectasis, and newly mildly enlarged mediastinal and right hilar lymph node reactive need CT follow-up.  Numerous bilateral solid pulmonary nodules unchanged from 2018.  Pulmonary consulted, dyspnea, hypoxia multifactorial. Plan to resume diuretics and monitor kidney function.    Assessment & Plan:   Dyspnea on Exertion: With acute hypoxic respiratory failure.  Due to combination of due to on chronic diastolic CHF EF of > 46%, mild pleural effusions and underlying history of low-grade lung neuroendocrine tumor. CTA Chest with moderate right pleural effusion.  Has been diuresed, seen by pulmonary, shortness of breath improved.  Also has underlying history of low-grade lung neuroendocrine tumor.  Currently stable on 1 to 2 L nasal cannula oxygen and relatively symptom-free for now.  Continue nebulizer treatments and oxygen as needed.  Hyponatremia.  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  Pain arising from her neck all the way to her right shoulder arm and wrist - stable Ct C spine, x-ray right shoulder, right elbow, CT of the right wrist with supportive care pain much improved, she had stable uric acid levels as well.  Likely musculoskeletal in nature.  Acute  on Chronic Diastolic HF - has been diuresed continue to monitor.  Close to euvolemic now, see #1 above.   Paroxysmal atrial fibrillation with [place] vas 2 score of > 4  ; Continue with Cardizem, metoprolol and Eliquis, dose of Cardizem and beta-blocker adjusted as blood pressure now running little low.  H/O CAD; S/P PCI - Denies chest pain.   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  On Stiolto-- change to Pulmicort by pulmonologist. Followed by pulm as outpt  AKI on ckd 3b;  Cr peak to 1.8--- down to 1.4-1.6 which appears to be her baseline continue to monitor.  Nausea, vomiting: Dysphagia.  Stable CT scan, seen by GI and speech, now tolerating oral diet.   GERD; PPI changed to BID  Diarrhea;  Acute on chronic. No further diarrhea.   Hypomagnesemia; Replaced.   Hypokalemia; Replaced.     Estimated body mass index is 22.05 kg/m as calculated from the following:   Height as of this encounter: 5' 2.01" (1.575 m).   Weight as of this encounter: 54.7 kg.   DVT prophylaxis: apixaban Code Status: DNR Family Communication: Called son [name] on 11/24/2022 at 8:50 AM message left on his cell phone [phone]  Disposition Plan:  Status is: Inpatient Remains inpatient appropriate because: pursuing snf placement, she will be an ideal candidate for SNF with palliative care.  Long-term prognosis is poor due to her age and frail status.   Consultants:  CCM Dr Einar Gip Palliative care  Procedures:  none  Antimicrobials:    Subjective: Patient in bed denies any headache chest or abdominal pain, right arm discomfort much improved, feels slightly weak all over.  Objective: Vitals:   11/25/22  1884 11/25/22 0800 11/25/22 0834 11/25/22 0924  BP: 92/61 (!) 102/53  102/68  Pulse:  85  94  Resp:  (!) 28  (!) 21  Temp: 98.1 F (36.7 C)     TempSrc: Oral     SpO2:  98% 92% 92%  Weight:      Height:        Intake/Output Summary (Last 24 hours) at 11/25/2022 1021 Last data filed at  11/25/2022 0516 Gross per 24 hour  Intake 240 ml  Output 1150 ml  Net -910 ml    Filed Weights   11/13/22 0600  Weight: 54.7 kg    Examination:  Awake Alert, No new F.N deficits, Normal affect [place].AT,PERRAL Supple Neck, No JVD,   Symmetrical Chest wall movement, Good air movement bilaterally, CTAB RRR,No Gallops, Rubs or new Murmurs,  +ve B.Sounds, Abd Soft, No tenderness,   No Cyanosis, no edema     Data Reviewed: I have personally reviewed following labs and imaging studies  Recent Labs  Lab 11/19/22 0617 11/23/22 1453 11/24/22 0323 11/25/22 0559  WBC 5.4 7.1 5.8 4.5  HGB 10.2* 9.4* 9.2* 9.0*  HCT 30.0* 26.9* 25.6* 26.4*  PLT 121* 199 202 229  MCV 87.7 84.3 82.8 84.9  MCH 29.8 29.5 29.8 28.9  MCHC 34.0 34.9 35.9 34.1  RDW 13.2 12.7 13.0 13.1  LYMPHSABS  --   --  1.2 0.9  MONOABS  --   --  1.2* 0.7  EOSABS  --   --  0.0 0.0  BASOSABS  --   --  0.0 0.0    Recent Labs  Lab 11/21/22 0220 11/22/22 0240 11/23/22 0253 11/23/22 0817 11/23/22 1453 11/24/22 0320 11/24/22 0323 11/25/22 0559  NA 130* 131* 128*  --   --   --  126* 130*  K 4.4 4.3 4.1  --   --   --  4.0 3.9  CL 91* 92* 90*  --   --   --  87* 86*  CO2 27 26 27   --   --   --  28 28  GLUCOSE 211* 207* 190*  --   --   --  186* 150*  BUN 26* 34* 34*  --   --   --  43* 48*  CREATININE 1.48* 1.60* 1.36*  --   --   --  1.59* 1.59*  AST  --   --   --   --   --  26  --  31  ALT  --   --   --   --   --  20  --  24  ALKPHOS  --   --   --   --   --  66  --  58  BILITOT  --   --   --   --   --  0.8  --  0.6  ALBUMIN  --   --   --   --   --  2.6*  --  2.7*  CRP  --   --   --   --  10.7*  --  10.0* 8.5*  BNP  --   --   --  213.3*  --   --  301.1* 403.4*  MG  --   --   --   --   --   --  1.4* 2.6*  CALCIUM 8.9 9.1 8.8*  --   --   --  8.5* 9.0     Radiology  Studies: CT WRIST RIGHT WO CONTRAST  Result Date: 11/23/2022 CLINICAL DATA:  Wrist pain. EXAM: CT OF THE RIGHT WRIST WITHOUT CONTRAST TECHNIQUE:  Multidetector CT imaging of the right wrist was performed according to the standard protocol. Multiplanar CT image reconstructions were also generated. RADIATION DOSE REDUCTION: This exam was performed according to the departmental dose-optimization program which includes automated exposure control, adjustment of the mA and/or kV according to patient size and/or use of iterative reconstruction technique. COMPARISON:  Radiographs 11/22/2022 FINDINGS: Moderate wrist joint degenerative changes with joint space narrowing, subchondral cystic change and chondrocalcinosis. No acute fracture. No evidence of AVN. IMPRESSION: 1. Moderate wrist joint degenerative changes with chondrocalcinosis. 2. No acute fracture. Electronically Signed   By: Marijo Sanes M.D.   On: 11/23/2022 13:17   CT CERVICAL SPINE WO CONTRAST  Result Date: 11/23/2022 CLINICAL DATA:  Neck and right arm pain. EXAM: CT CERVICAL SPINE WITHOUT CONTRAST TECHNIQUE: Multidetector CT imaging of the cervical spine was performed without intravenous contrast. Multiplanar CT image reconstructions were also generated. RADIATION DOSE REDUCTION: This exam was performed according to the departmental dose-optimization program which includes automated exposure control, adjustment of the mA and/or kV according to patient size and/or use of iterative reconstruction technique. COMPARISON:  CT scan from 1,013 FINDINGS: Examination limited by patient motion. Alignment: Grossly normal alignment. Skull base and vertebrae: No acute cervical spine fracture. Progressive significant C1-2 degenerative changes with pannus formation. Soft tissues and spinal canal: No prevertebral fluid or swelling. No visible canal hematoma. Disc levels: The spinal canal is fairly generous. No obvious large disc protrusions or spinal stenosis. Multilevel foraminal stenosis due to uncinate spurring facet disease. Upper chest: Emphysematous changes are noted along with bilateral pleural effusions.  Other: No neck mass, adenopathy or hematoma. Bilateral carotid artery calcifications. IMPRESSION: 1. Limited examination due to patient motion. 2. No acute cervical spine fracture. 3. Progressive significant C1-2 degenerative changes with pannus formation. 4. Mild multilevel foraminal stenosis due to uncinate spurring and facet disease. 5. Bilateral pleural effusions. Electronically Signed   By: Marijo Sanes M.D.   On: 11/23/2022 13:14   DG Elbow 2 Views Right  Result Date: 11/23/2022 CLINICAL DATA:  Elbow pain. EXAM: RIGHT ELBOW - 2 VIEW COMPARISON:  None Available. FINDINGS: Two-view exam shows no fracture or dislocation. Lateral film reveals no fat pad elevation to suggest joint effusion. No worrisome lytic or sclerotic osseous abnormality. IMPRESSION: Negative. Electronically Signed   By: Misty Stanley M.D.   On: 11/23/2022 11:51   DG Shoulder Right Port  Result Date: 11/23/2022 CLINICAL DATA:  Shoulder pain. EXAM: RIGHT SHOULDER - 1 VIEW COMPARISON:  None Available. FINDINGS: Bones are markedly demineralized. No evidence for an acute fracture or dislocation. Degenerative changes are seen in the glenohumeral joint and in the acromioclavicular joint. No worrisome lytic or sclerotic osseous abnormality. Changes in the right lung better evaluated on chest x-ray performed earlier today. IMPRESSION: Degenerative changes without acute bony findings. Electronically Signed   By: Misty Stanley M.D.   On: 11/23/2022 11:48    Scheduled Meds:  apixaban  2.5 mg Oral BID   budesonide (PULMICORT) nebulizer solution  0.5 mg Nebulization BID   [START ON 11/26/2022] diltiazem  120 mg Oral Daily   feeding supplement  237 mL Oral BID BM   insulin aspart  0-9 Units Subcutaneous TID WC   insulin glargine-yfgn  15 Units Subcutaneous QHS   levothyroxine  88 mcg Oral Q0600   [START ON 11/26/2022] metoprolol succinate  25  mg Oral Daily   multivitamin with minerals  1 tablet Oral Daily   ondansetron  4 mg Oral Q8H    pantoprazole  40 mg Oral Daily   rosuvastatin  20 mg Oral Daily   Continuous Infusions:  lactated ringers     promethazine (PHENERGAN) injection (IM or IVPB)       LOS: 12 days   Signature  -    Lala Lund M.D on 11/25/2022 at 10:21 AM   -  To page go to [email or web address]

## 2022-11-25 NOTE — Progress Notes (Signed)
Mobility Specialist Progress Note:   11/25/22 0900  Mobility  Activity Transferred from bed to chair  Level of Assistance Minimal assist, patient does 75% or more  Assistive Device Other (Comment) (HHA)  Distance Ambulated (ft) 3 ft  Activity Response Tolerated well  Mobility Referral Yes  $Mobility charge 1 Mobility   Pt received sitting EOB, finishing breakfast. Eager to transfer to chair, pt required minA through [facility]. Pt left with all needs met, chair alarm on.   Nelta Numbers Mobility Specialist Please contact via SecureChat or  Rehab office at [phone]

## 2022-11-25 NOTE — Plan of Care (Signed)

## 2022-11-25 NOTE — Progress Notes (Signed)
Physical Therapy Treatment Patient Details Name: [name] MRN: [id number] DOB: 15-Nov-1934 Today's Date: 11/25/2022   History of Present Illness 88 year old presented to [facility] on  11/11/22 with SOB. Pt developed afib with RVR in ED. Pt with nausea/vomiting likely due to esophageal dysmotility. 11/21 Pulmonary consulted, dyspnea, hypoxia multifactorial. PMH - CAD, pulmonary htn, afib, dm, htn, lung CA, stroke.    PT Comments    Pt was seen for mobility and reviewed her PLOF.  Requires no assist for living alone in the house with pt reporting she does her own light housekeeping, cooked and did not use AD.  Per pt family does offer some help, with yardwork, occas take out food and does her shopping.  No longer drives, lives in all level environment.  Follow acutely for goals of PT as are outlined on POC.   Recommendations for follow up therapy are one component of a multi-disciplinary discharge planning process, led by the attending physician.  Recommendations may be updated based on patient status, additional functional criteria and insurance authorization.  Follow Up Recommendations  Skilled nursing-short term rehab (<3 hours/day) Can patient physically be transported by private vehicle: Yes   Assistance Recommended at Discharge Frequent or constant Supervision/Assistance  Patient can return home with the following Help with stairs or ramp for entrance;Assistance with cooking/housework;A little help with walking and/or transfers;A little help with bathing/dressing/bathroom;Assist for transportation   Equipment Recommendations  None recommended by PT    Recommendations for Other Services       Precautions / Restrictions Precautions Precautions: Fall;Other (comment) Precaution Comments: O2 sats and HR Restrictions Weight Bearing Restrictions: No     Mobility  Bed Mobility               General bed mobility comments: up in recliner when PT arrives     Transfers Overall transfer level: Needs assistance Equipment used: Rolling walker (2 wheels) Transfers: Sit to/from Stand Sit to Stand: Min assist           General transfer comment: min assist to power up then min to maintain balance    Ambulation/Gait Ambulation/Gait assistance: Min guard, Min assist Gait Distance (Feet): 50 Feet (25 x 2) Assistive device: Rolling walker (2 wheels) Gait Pattern/deviations: Step-through pattern, Wide base of support, Decreased stride length Gait velocity: reduced Gait velocity interpretation: <1.31 ft/sec, indicative of household ambulator Pre-gait activities: standing balance and vitals General Gait Details: min guard for std gait and min assist for IV pole and lines   Stairs             Wheelchair Mobility    Modified Rankin (Stroke Patients Only)       Balance Overall balance assessment: Needs assistance Sitting-balance support: Feet supported Sitting balance-Leahy Scale: Fair     Standing balance support: Bilateral upper extremity supported, During functional activity Standing balance-Leahy Scale: Poor                              Cognition Arousal/Alertness: Awake/alert Behavior During Therapy: WFL for tasks assessed/performed Overall Cognitive Status: Within Functional Limits for tasks assessed                                          Exercises      General Comments General comments (skin integrity, edema, etc.): Pt desaturated on 3L  with walk from 97% to 88%, after two short walks      Pertinent Vitals/Pain Pain Assessment Pain Assessment: No/denies pain    Home Living                          Prior Function            PT Goals (current goals can now be found in the care plan section) Acute Rehab PT Goals Patient Stated Goal: get home and live independently Progress towards PT goals: Progressing toward goals    Frequency    Min 3X/week      PT  Plan Current plan remains appropriate    Co-evaluation              AM-PAC PT "6 Clicks" Mobility   Outcome Measure  Help needed turning from your back to your side while in a flat bed without using bedrails?: A Little Help needed moving from lying on your back to sitting on the side of a flat bed without using bedrails?: A Little Help needed moving to and from a bed to a chair (including a wheelchair)?: A Little Help needed standing up from a chair using your arms (e.g., wheelchair or bedside chair)?: A Little Help needed to walk in hospital room?: A Little Help needed climbing 3-5 steps with a railing? : A Lot 6 Click Score: 17    End of Session Equipment Utilized During Treatment: Oxygen;Gait belt Activity Tolerance: Patient limited by fatigue Patient left: with call bell/phone within reach;in chair;with chair alarm set;with family/visitor present Nurse Communication: Mobility status PT Visit Diagnosis: Unsteadiness on feet (R26.81);Muscle weakness (generalized) (M62.81)     Time: 4944-9675 PT Time Calculation (min) (ACUTE ONLY): 27 min  Charges:  $Gait Training: 8-22 mins $Therapeutic Activity: 8-22 mins  Ramond Dial 11/25/2022, 11:24 AM  Mee Hives, PT PhD Acute Rehab Dept. Number: [place] and [place]

## 2022-11-25 NOTE — TOC Progression Note (Signed)
Transition of Care [facility]) - Progression Note    Patient Details  Name: [name] MRN: [id number] Date of Birth: 1934/01/04  Transition of Care [facility]) CM/SW [place], [place] Phone Number: 11/25/2022, 4:11 PM  Clinical Narrative:    Son has selected [facility]. CSW received insurance approval, Ref# [id number], Auth ID# [id number], effective 11/25/2022-11/27/2022.   Expected Discharge Plan: [place] Barriers to Discharge: Continued Medical Work up  Expected Discharge Plan and Services Expected Discharge Plan: [place]   Discharge Planning Services: CM Consult Post Acute Care Choice: [place] arrangements for the past 2 months: Single Family Home                 DME Arranged: Oxygen DME Agency: [employer] Date DME Agency Contacted: 11/17/22 Time DME Agency Contacted: [id number] Representative spoke with at DME Agency: [place]: PT [place]: [place] ([place]) Date [place]: 11/14/22 Time [place]: 1448 Representative spoke with at [place]: [place] ([place]) Interventions    Readmission Risk Interventions     No data to display

## 2022-11-26 DIAGNOSIS — N1832 Chronic kidney disease, stage 3b: Secondary | ICD-10-CM | POA: Diagnosis not present

## 2022-11-26 DIAGNOSIS — N178 Other acute kidney failure: Secondary | ICD-10-CM | POA: Diagnosis not present

## 2022-11-26 DIAGNOSIS — E860 Dehydration: Secondary | ICD-10-CM | POA: Diagnosis not present

## 2022-11-26 DIAGNOSIS — M6281 Muscle weakness (generalized): Secondary | ICD-10-CM | POA: Diagnosis not present

## 2022-11-26 DIAGNOSIS — R0689 Other abnormalities of breathing: Secondary | ICD-10-CM | POA: Diagnosis not present

## 2022-11-26 DIAGNOSIS — R0609 Other forms of dyspnea: Secondary | ICD-10-CM | POA: Diagnosis not present

## 2022-11-26 DIAGNOSIS — E440 Moderate protein-calorie malnutrition: Secondary | ICD-10-CM | POA: Diagnosis not present

## 2022-11-26 DIAGNOSIS — J9601 Acute respiratory failure with hypoxia: Secondary | ICD-10-CM | POA: Diagnosis not present

## 2022-11-26 DIAGNOSIS — D649 Anemia, unspecified: Secondary | ICD-10-CM | POA: Diagnosis not present

## 2022-11-26 DIAGNOSIS — E1165 Type 2 diabetes mellitus with hyperglycemia: Secondary | ICD-10-CM | POA: Diagnosis not present

## 2022-11-26 DIAGNOSIS — R197 Diarrhea, unspecified: Secondary | ICD-10-CM | POA: Diagnosis not present

## 2022-11-26 DIAGNOSIS — D631 Anemia in chronic kidney disease: Secondary | ICD-10-CM | POA: Diagnosis not present

## 2022-11-26 DIAGNOSIS — I272 Pulmonary hypertension, unspecified: Secondary | ICD-10-CM | POA: Diagnosis not present

## 2022-11-26 DIAGNOSIS — I480 Paroxysmal atrial fibrillation: Secondary | ICD-10-CM | POA: Diagnosis not present

## 2022-11-26 DIAGNOSIS — Z794 Long term (current) use of insulin: Secondary | ICD-10-CM | POA: Diagnosis not present

## 2022-11-26 DIAGNOSIS — G4700 Insomnia, unspecified: Secondary | ICD-10-CM | POA: Diagnosis not present

## 2022-11-26 DIAGNOSIS — Z8673 Personal history of transient ischemic attack (TIA), and cerebral infarction without residual deficits: Secondary | ICD-10-CM | POA: Diagnosis not present

## 2022-11-26 DIAGNOSIS — I4891 Unspecified atrial fibrillation: Secondary | ICD-10-CM | POA: Diagnosis not present

## 2022-11-26 DIAGNOSIS — R112 Nausea with vomiting, unspecified: Secondary | ICD-10-CM | POA: Diagnosis not present

## 2022-11-26 DIAGNOSIS — I509 Heart failure, unspecified: Secondary | ICD-10-CM | POA: Diagnosis not present

## 2022-11-26 DIAGNOSIS — Y95 Nosocomial condition: Secondary | ICD-10-CM | POA: Diagnosis not present

## 2022-11-26 DIAGNOSIS — R64 Cachexia: Secondary | ICD-10-CM | POA: Diagnosis not present

## 2022-11-26 DIAGNOSIS — I4821 Permanent atrial fibrillation: Secondary | ICD-10-CM | POA: Diagnosis not present

## 2022-11-26 DIAGNOSIS — J9621 Acute and chronic respiratory failure with hypoxia: Secondary | ICD-10-CM | POA: Diagnosis not present

## 2022-11-26 DIAGNOSIS — E538 Deficiency of other specified B group vitamins: Secondary | ICD-10-CM | POA: Diagnosis not present

## 2022-11-26 DIAGNOSIS — R0602 Shortness of breath: Secondary | ICD-10-CM | POA: Diagnosis not present

## 2022-11-26 DIAGNOSIS — Z66 Do not resuscitate: Secondary | ICD-10-CM | POA: Diagnosis not present

## 2022-11-26 DIAGNOSIS — I482 Chronic atrial fibrillation, unspecified: Secondary | ICD-10-CM | POA: Diagnosis not present

## 2022-11-26 DIAGNOSIS — I2510 Atherosclerotic heart disease of native coronary artery without angina pectoris: Secondary | ICD-10-CM | POA: Diagnosis not present

## 2022-11-26 DIAGNOSIS — R0902 Hypoxemia: Secondary | ICD-10-CM | POA: Diagnosis not present

## 2022-11-26 DIAGNOSIS — Z7901 Long term (current) use of anticoagulants: Secondary | ICD-10-CM | POA: Diagnosis not present

## 2022-11-26 DIAGNOSIS — E43 Unspecified severe protein-calorie malnutrition: Secondary | ICD-10-CM | POA: Diagnosis not present

## 2022-11-26 DIAGNOSIS — J90 Pleural effusion, not elsewhere classified: Secondary | ICD-10-CM | POA: Diagnosis not present

## 2022-11-26 DIAGNOSIS — C3492 Malignant neoplasm of unspecified part of left bronchus or lung: Secondary | ICD-10-CM | POA: Diagnosis not present

## 2022-11-26 DIAGNOSIS — R627 Adult failure to thrive: Secondary | ICD-10-CM | POA: Diagnosis not present

## 2022-11-26 DIAGNOSIS — I5033 Acute on chronic diastolic (congestive) heart failure: Secondary | ICD-10-CM | POA: Diagnosis not present

## 2022-11-26 DIAGNOSIS — R531 Weakness: Secondary | ICD-10-CM | POA: Diagnosis not present

## 2022-11-26 DIAGNOSIS — N183 Chronic kidney disease, stage 3 unspecified: Secondary | ICD-10-CM | POA: Diagnosis not present

## 2022-11-26 DIAGNOSIS — C7A8 Other malignant neuroendocrine tumors: Secondary | ICD-10-CM | POA: Diagnosis not present

## 2022-11-26 DIAGNOSIS — E1122 Type 2 diabetes mellitus with diabetic chronic kidney disease: Secondary | ICD-10-CM | POA: Diagnosis not present

## 2022-11-26 DIAGNOSIS — C3490 Malignant neoplasm of unspecified part of unspecified bronchus or lung: Secondary | ICD-10-CM | POA: Diagnosis not present

## 2022-11-26 DIAGNOSIS — Z743 Need for continuous supervision: Secondary | ICD-10-CM | POA: Diagnosis not present

## 2022-11-26 DIAGNOSIS — C3491 Malignant neoplasm of unspecified part of right bronchus or lung: Secondary | ICD-10-CM | POA: Diagnosis not present

## 2022-11-26 DIAGNOSIS — I130 Hypertensive heart and chronic kidney disease with heart failure and stage 1 through stage 4 chronic kidney disease, or unspecified chronic kidney disease: Secondary | ICD-10-CM | POA: Diagnosis not present

## 2022-11-26 DIAGNOSIS — I499 Cardiac arrhythmia, unspecified: Secondary | ICD-10-CM | POA: Diagnosis not present

## 2022-11-26 DIAGNOSIS — I5041 Acute combined systolic (congestive) and diastolic (congestive) heart failure: Secondary | ICD-10-CM | POA: Diagnosis not present

## 2022-11-26 DIAGNOSIS — I5032 Chronic diastolic (congestive) heart failure: Secondary | ICD-10-CM | POA: Diagnosis not present

## 2022-11-26 DIAGNOSIS — E119 Type 2 diabetes mellitus without complications: Secondary | ICD-10-CM | POA: Diagnosis not present

## 2022-11-26 DIAGNOSIS — D3A090 Benign carcinoid tumor of the bronchus and lung: Secondary | ICD-10-CM

## 2022-11-26 DIAGNOSIS — K219 Gastro-esophageal reflux disease without esophagitis: Secondary | ICD-10-CM | POA: Diagnosis not present

## 2022-11-26 DIAGNOSIS — R0603 Acute respiratory distress: Secondary | ICD-10-CM | POA: Diagnosis not present

## 2022-11-26 DIAGNOSIS — E039 Hypothyroidism, unspecified: Secondary | ICD-10-CM | POA: Diagnosis not present

## 2022-11-26 DIAGNOSIS — Z20822 Contact with and (suspected) exposure to covid-19: Secondary | ICD-10-CM | POA: Diagnosis not present

## 2022-11-26 DIAGNOSIS — E871 Hypo-osmolality and hyponatremia: Secondary | ICD-10-CM | POA: Diagnosis not present

## 2022-11-26 DIAGNOSIS — E038 Other specified hypothyroidism: Secondary | ICD-10-CM | POA: Diagnosis not present

## 2022-11-26 DIAGNOSIS — F419 Anxiety disorder, unspecified: Secondary | ICD-10-CM | POA: Diagnosis not present

## 2022-11-26 DIAGNOSIS — I10 Essential (primary) hypertension: Secondary | ICD-10-CM | POA: Diagnosis not present

## 2022-11-26 DIAGNOSIS — Z9981 Dependence on supplemental oxygen: Secondary | ICD-10-CM | POA: Diagnosis not present

## 2022-11-26 DIAGNOSIS — Z515 Encounter for palliative care: Secondary | ICD-10-CM | POA: Diagnosis not present

## 2022-11-26 DIAGNOSIS — J189 Pneumonia, unspecified organism: Secondary | ICD-10-CM | POA: Diagnosis not present

## 2022-11-26 LAB — CBC WITH DIFFERENTIAL/PLATELET
Abs Immature Granulocytes: 0.06 10*3/uL (ref 0.00–0.07)
Basophils Absolute: 0 10*3/uL (ref 0.0–0.1)
Basophils Relative: 0 %
Eosinophils Absolute: 0 10*3/uL (ref 0.0–0.5)
Eosinophils Relative: 0 %
HCT: 27.6 % — ABNORMAL LOW (ref 36.0–46.0)
Hemoglobin: 9.4 g/dL — ABNORMAL LOW (ref 12.0–15.0)
Immature Granulocytes: 1 %
Lymphocytes Relative: 23 %
Lymphs Abs: 1.3 10*3/uL (ref 0.7–4.0)
MCH: 29.1 pg (ref 26.0–34.0)
MCHC: 34.1 g/dL (ref 30.0–36.0)
MCV: 85.4 fL (ref 80.0–100.0)
Monocytes Absolute: 1 10*3/uL (ref 0.1–1.0)
Monocytes Relative: 17 %
Neutro Abs: 3.5 10*3/uL (ref 1.7–7.7)
Neutrophils Relative %: 59 %
Platelets: 235 10*3/uL (ref 150–400)
RBC: 3.23 MIL/uL — ABNORMAL LOW (ref 3.87–5.11)
RDW: 13.2 % (ref 11.5–15.5)
WBC: 5.9 10*3/uL (ref 4.0–10.5)
nRBC: 0 % (ref 0.0–0.2)

## 2022-11-26 LAB — COMPREHENSIVE METABOLIC PANEL
ALT: 29 U/L (ref 0–44)
AST: 35 U/L (ref 15–41)
Albumin: 2.8 g/dL — ABNORMAL LOW (ref 3.5–5.0)
Alkaline Phosphatase: 68 U/L (ref 38–126)
Anion gap: 9 (ref 5–15)
BUN: 45 mg/dL — ABNORMAL HIGH (ref 8–23)
CO2: 28 mmol/L (ref 22–32)
Calcium: 8.7 mg/dL — ABNORMAL LOW (ref 8.9–10.3)
Chloride: 96 mmol/L — ABNORMAL LOW (ref 98–111)
Creatinine, Ser: 1.61 mg/dL — ABNORMAL HIGH (ref 0.44–1.00)
GFR, Estimated: 31 mL/min — ABNORMAL LOW (ref >60)
Glucose, Bld: 136 mg/dL — ABNORMAL HIGH (ref 70–99)
Potassium: 4.1 mmol/L (ref 3.5–5.1)
Sodium: 133 mmol/L — ABNORMAL LOW (ref 135–145)
Total Bilirubin: 0.8 mg/dL (ref 0.3–1.2)
Total Protein: 6.2 g/dL — ABNORMAL LOW (ref 6.5–8.1)

## 2022-11-26 LAB — GLUCOSE, CAPILLARY
Glucose-Capillary: 143 mg/dL — ABNORMAL HIGH (ref 70–99)
Glucose-Capillary: 231 mg/dL — ABNORMAL HIGH (ref 70–99)
Glucose-Capillary: 270 mg/dL — ABNORMAL HIGH (ref 70–99)

## 2022-11-26 LAB — C-REACTIVE PROTEIN: CRP: 5.4 mg/dL — ABNORMAL HIGH (ref <1.0)

## 2022-11-26 LAB — MAGNESIUM: Magnesium: 2.1 mg/dL (ref 1.7–2.4)

## 2022-11-26 LAB — BRAIN NATRIURETIC PEPTIDE: B Natriuretic Peptide: 377.7 pg/mL — ABNORMAL HIGH (ref 0.0–100.0)

## 2022-11-26 MED ORDER — BUDESONIDE 0.5 MG/2ML IN SUSP: 0.5000 mg | Freq: Two times a day (BID) | RESPIRATORY_TRACT | 12 refills | Status: AC

## 2022-11-26 MED ORDER — TRAZODONE HCL 50 MG PO TABS: 50.0000 mg | ORAL_TABLET | Freq: Every evening | ORAL | Status: DC | PRN

## 2022-11-26 MED ORDER — METOPROLOL SUCCINATE ER 25 MG PO TB24: 25.0000 mg | ORAL_TABLET | Freq: Every day | ORAL | Status: DC

## 2022-11-26 MED ORDER — ACETAMINOPHEN 325 MG PO TABS: 650.0000 mg | ORAL_TABLET | Freq: Four times a day (QID) | ORAL | Status: AC | PRN

## 2022-11-26 MED ORDER — APIXABAN 2.5 MG PO TABS: 2.5000 mg | ORAL_TABLET | Freq: Two times a day (BID) | ORAL | Status: DC

## 2022-11-26 MED ORDER — INSULIN ASPART 100 UNIT/ML IJ SOLN: 0.0000 [IU] | Freq: Three times a day (TID) | INTRAMUSCULAR | 11 refills | Status: AC

## 2022-11-26 MED ORDER — IPRATROPIUM-ALBUTEROL 0.5-2.5 (3) MG/3ML IN SOLN: 3.0000 mL | Freq: Four times a day (QID) | RESPIRATORY_TRACT | Status: AC | PRN

## 2022-11-26 MED ORDER — TRESIBA FLEXTOUCH 100 UNIT/ML ~~LOC~~ SOPN: 12.0000 [IU] | PEN_INJECTOR | Freq: Every day | SUBCUTANEOUS | Status: DC

## 2022-11-26 MED ORDER — ONDANSETRON HCL 4 MG PO TABS: 4.0000 mg | ORAL_TABLET | Freq: Three times a day (TID) | ORAL | 0 refills | Status: DC | PRN

## 2022-11-26 MED ORDER — VITAMIN B-12 1000 MCG PO TABS: 1000.0000 ug | ORAL_TABLET | Freq: Every day | ORAL | Status: DC

## 2022-11-26 MED ORDER — ENSURE ENLIVE PO LIQD: 237.0000 mL | Freq: Two times a day (BID) | ORAL | 12 refills | Status: DC

## 2022-11-26 MED ORDER — FUROSEMIDE 20 MG PO TABS: 20.0000 mg | ORAL_TABLET | Freq: Every day | ORAL | 11 refills | Status: AC

## 2022-11-26 MED ORDER — ADULT MULTIVITAMIN W/MINERALS CH: 1.0000 | ORAL_TABLET | Freq: Every day | ORAL | Status: AC

## 2022-11-26 MED ORDER — DILTIAZEM HCL ER COATED BEADS 120 MG PO CP24: 120.0000 mg | ORAL_CAPSULE | Freq: Every day | ORAL | Status: DC

## 2022-11-26 NOTE — Discharge Instructions (Signed)
Follow with Primary MD Adria Dill [place] Reader, FNP/SNF physician   Get CBC, CMP, 2 view Chest X ray checked  by Primary MD next visit.    Activity: As tolerated with Full fall precautions use walker/cane & assistance as needed   Disposition SNF   Diet: Heart Healthy , with feeding assistance and aspiration precautions.  For Heart failure patients - Check your Weight same time everyday, if you gain over 2 pounds, or you develop in leg swelling, experience more shortness of breath or chest pain, call your Primary MD immediately. Follow Cardiac Low Salt Diet and 1.5 lit/day fluid restriction.   On your next visit with your primary care physician please Get Medicines reviewed and adjusted.   Please request your Prim.MD to go over all Hospital Tests and Procedure/Radiological results at the follow up, please get all Hospital records sent to your Prim MD by signing hospital release before you go home.   If you experience worsening of your admission symptoms, develop shortness of breath, life threatening emergency, suicidal or homicidal thoughts you must seek medical attention immediately by calling 911 or calling your MD immediately  if symptoms less severe.  You Must read complete instructions/literature along with all the possible adverse reactions/side effects for all the Medicines you take and that have been prescribed to you. Take any new Medicines after you have completely understood and accpet all the possible adverse reactions/side effects.   Do not drive, operating heavy machinery, perform activities at heights, swimming or participation in water activities or provide baby sitting services if your were admitted for syncope or siezures until you have seen by Primary MD or a Neurologist and advised to do so again.  Do not drive when taking Pain medications.    Do not take more than prescribed Pain, Sleep and Anxiety Medications  Special Instructions: If you have smoked or chewed  Tobacco  in the last 2 yrs please stop smoking, stop any regular Alcohol  and or any Recreational drug use.  Wear Seat belts while driving.   Please note  You were cared for by a hospitalist during your hospital stay. If you have any questions about your discharge medications or the care you received while you were in the hospital after you are discharged, you can call the unit and asked to speak with the hospitalist on call if the hospitalist that took care of you is not available. Once you are discharged, your primary care physician will handle any further medical issues. Please note that NO REFILLS for any discharge medications will be authorized once you are discharged, as it is imperative that you return to your primary care physician (or establish a relationship with a primary care physician if you do not have one) for your aftercare needs so that they can reassess your need for medications and monitor your lab values.

## 2022-11-26 NOTE — Discharge Summary (Signed)
Physician Discharge Summary  [name]:[id number] DOB: April 08, 1934 DOA: 11/11/2022  PCP: [name], FNP  Admit date: 11/11/2022 Discharge date: 11/26/2022  Admitted From: (Home) Disposition:  (SNF)  Recommendations for Outpatient Follow-up:  Sent on 2 to 3 L nasal cannula on discharge, please continue to wean as tolerated Keep encouraging to use incentive spirometry This check CBC, BMP in 3 days Please consult palliative medicine at your facility to keep following patient closely    Discharge Condition: (Stable) CODE STATUS: ( DNR) Diet recommendation: Heart Healthy / Carb Modified   Brief/Interim Summary:  88 year old past medical history significant for CAD, pulmonary hypertension, right subclavian artery stenosis, permanent A-fib, diabetes type 2, hypertension, stage IV low-grade Carcinoid/neuroendocrine presents complaining of worsening shortness of breath on exertion over the last past several months, but worse over the last several days was advised to present to the ED by Dr. Einar Gip. Patient  also reports some chocking episodes with meals. CTA negative for PE, moderate right and small left pleural effusion with associated atelectasis, and newly mildly enlarged mediastinal and right hilar lymph node reactive need CT follow-up.  Numerous bilateral solid pulmonary nodules unchanged from 2018.   Dyspnea on Exertion Acute hypoxic respiratory failure. Acute on chronic diastolic CHF with bilateral pleural effusion Low-grade lung neuroendocrine tumor -Presents with dyspnea, new oxygen requirement due to combination of due to on chronic diastolic CHF EF of > 50%, mild pleural effusions and underlying history of low-grade lung neuroendocrine tumor. CTA Chest with moderate right pleural effusion.  Has been diuresed, seen by pulmonary, shortness of breath improved.  Also has underlying history of low-grade lung neuroendocrine tumor.  Is currently on 3 L nasal cannula , he  has much improved, she was encouraged to use incentive spirometry .   Hyponatremia.  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 -Sodium has 133 today on discharge, please recheck level in 3 days.   Pain arising from her neck all the way to her right shoulder arm and wrist - stable Ct C spine, x-ray right shoulder, right elbow, CT of the right wrist with supportive care pain much improved, she had stable uric acid levels as well.  Likely musculoskeletal in nature.   Acute on Chronic Diastolic HF  - has been diuresed continue to monitor.  Close to euvolemic now, be discharged on low-dose Lasix 20 mg p.o. daily   Paroxysmal atrial fibrillation with [place] vas 2 score of > 4  ; Continue with Cardizem, metoprolol and Eliquis, dose of Cardizem and beta-blocker has been lowered given soft blood pressure   H/O CAD; S/P PCI - Denies chest pain.    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  On Stiolto-- change to Pulmicort by pulmonologist. Followed by pulm as outpt   AKI on ckd 3b;  Cr peak to 1.8--- down to 1.4-1.6 which appears to be her baseline continue to monitor.   Nausea, vomiting: Dysphagia.  Stable CT scan, seen by GI and speech, now tolerating oral diet.   GERD; PPI changed to BID   Diarrhea;  Acute on chronic. No further diarrhea.    Hypomagnesemia; Replaced.    Hypokalemia; Replaced.        Estimated body mass index is 22.05 kg/m as calculated from the following:   Height as of this encounter: 5' 2.01" (1.575 m).   Weight as of this encounter: 54.7 kg.  Discharge  Diagnoses:  Principal Problem:   Hypoxia Active Problems:   Carcinoid tumor of lung   Atrial fibrillation (HCC) CHA2DS2-VASc Score 7   HTN (hypertension)   Hypothyroid   DM2 (diabetes mellitus, type 2) (HCC)   CAD (coronary artery disease)   Anemia   Protein-calorie malnutrition, severe    Discharge  Instructions  Discharge Instructions     Discharge instructions   Complete by: As directed    Follow with Primary MD Adria Dill Leonia Reader, FNP/SNF physician   Get CBC, CMP, 2 view Chest X ray checked  by Primary MD next visit.    Activity: As tolerated with Full fall precautions use walker/cane & assistance as needed   Disposition SNF   Diet: Heart Healthy , with feeding assistance and aspiration precautions.  For Heart failure patients - Check your Weight same time everyday, if you gain over 2 pounds, or you develop in leg swelling, experience more shortness of breath or chest pain, call your Primary MD immediately. Follow Cardiac Low Salt Diet and 1.5 lit/day fluid restriction.   On your next visit with your primary care physician please Get Medicines reviewed and adjusted.   Please request your Prim.MD to go over all Hospital Tests and Procedure/Radiological results at the follow up, please get all Hospital records sent to your Prim MD by signing hospital release before you go home.   If you experience worsening of your admission symptoms, develop shortness of breath, life threatening emergency, suicidal or homicidal thoughts you must seek medical attention immediately by calling 911 or calling your MD immediately  if symptoms less severe.  You Must read complete instructions/literature along with all the possible adverse reactions/side effects for all the Medicines you take and that have been prescribed to you. Take any new Medicines after you have completely understood and accpet all the possible adverse reactions/side effects.   Do not drive, operating heavy machinery, perform activities at heights, swimming or participation in water activities or provide baby sitting services if your were admitted for syncope or siezures until you have seen by Primary MD or a Neurologist and advised to do so again.  Do not drive when taking Pain medications.    Do not take more than  prescribed Pain, Sleep and Anxiety Medications  Special Instructions: If you have smoked or chewed Tobacco  in the last 2 yrs please stop smoking, stop any regular Alcohol  and or any Recreational drug use.  Wear Seat belts while driving.   Please note  You were cared for by a hospitalist during your hospital stay. If you have any questions about your discharge medications or the care you received while you were in the hospital after you are discharged, you can call the unit and asked to speak with the hospitalist on call if the hospitalist that took care of you is not available. Once you are discharged, your primary care physician will handle any further medical issues. Please note that NO REFILLS for any discharge medications will be authorized once you are discharged, as it is imperative that you return to your primary care physician (or establish a relationship with a primary care physician if you do not have one) for your aftercare needs so that they can reassess your need for medications and monitor your lab values.   Increase activity slowly   Complete by: As directed       Allergies as of 11/26/2022       Reactions   Codeine Nausea And Vomiting  Hydrocodone-acetaminophen Nausea And Vomiting        Medication List     STOP taking these medications    diltiazem 180 MG 24 hr capsule Commonly known as: Dilt-XR   sitaGLIPtin 50 MG tablet Commonly known as: JANUVIA       TAKE these medications    acetaminophen 325 MG tablet Commonly known as: TYLENOL Take 2 tablets (650 mg total) by mouth every 6 (six) hours as needed for mild pain, fever or headache. What changed:  medication strength how much to take reasons to take this   apixaban 2.5 MG Tabs tablet Commonly known as: ELIQUIS Take 1 tablet (2.5 mg total) by mouth 2 (two) times daily.   budesonide 0.5 MG/2ML nebulizer solution Commonly known as: PULMICORT Take 2 mLs (0.5 mg total) by nebulization 2 (two)  times daily.   [employer] CALCIUM CITRATE +D3 MINI PO Take 1 capsule by mouth daily.   cyanocobalamin 1000 MCG tablet Commonly known as: VITAMIN B12 Take 1 tablet (1,000 mcg total) by mouth daily. What changed: how much to take   diltiazem 120 MG 24 hr capsule Commonly known as: CARDIZEM CD Take 1 capsule (120 mg total) by mouth daily. Start taking on: November 27, 2022   Fluocinolone Acetonide 0.01 % Oil Place 5 drops in ear(s) daily.   insulin aspart 100 UNIT/ML injection Commonly known as: novoLOG Inject 0-9 Units into the skin 3 (three) times daily with meals.   ipratropium-albuterol 0.5-2.5 (3) MG/3ML Soln Commonly known as: DUONEB Take 3 mLs by nebulization every 6 (six) hours as needed.   lactose free nutrition Liqd Take 237 mLs by mouth daily. What changed: Another medication with the same name was added. Make sure you understand how and when to take each.   feeding supplement Liqd Take 237 mLs by mouth 2 (two) times daily between meals. What changed: You were already taking a medication with the same name, and this prescription was added. Make sure you understand how and when to take each.   levothyroxine 88 MCG tablet Commonly known as: SYNTHROID Take 88 mcg by mouth daily.   metoprolol succinate 25 MG 24 hr tablet Commonly known as: TOPROL-XL Take 1 tablet (25 mg total) by mouth daily. Start taking on: November 27, 2022 What changed:  medication strength how much to take   multivitamin with minerals Tabs tablet Take 1 tablet by mouth daily. Start taking on: November 27, 2022   NovoFine Plus Pen Needle 32G X 4 MM Misc Generic drug: Insulin Pen Needle ONE   ondansetron 4 MG tablet Commonly known as: ZOFRAN Take 1 tablet (4 mg total) by mouth every 8 (eight) hours as needed for nausea or vomiting.   pantoprazole 40 MG tablet Commonly known as: Protonix Take 1 tablet (40 mg total) by mouth daily.   rosuvastatin 20 MG tablet Commonly known as:  CRESTOR Take 1 tablet (20 mg total) by mouth daily.   Stiolto Respimat 2.5-2.5 MCG/ACT Aers Generic drug: Tiotropium Bromide-Olodaterol Inhale 2 puffs into the lungs daily.   traZODone 50 MG tablet Commonly known as: DESYREL Take 1 tablet (50 mg total) by mouth at bedtime as needed for sleep.   Tyler Aas FlexTouch 100 UNIT/ML FlexTouch Pen Generic drug: insulin degludec Inject 12 Units into the skin at bedtime. What changed: how much to take   Tums 500 MG chewable tablet Generic drug: calcium carbonate Chew 1 tablet by mouth daily.  Durable Medical Equipment  (From admission, onward)           Start     Ordered   11/17/22 1457  For home use only DME oxygen  Once       Question Answer Comment  Length of Need 6 Months   Mode or (Route) Nasal cannula   Liters per Minute 3   Frequency Continuous (stationary and portable oxygen unit needed)   Oxygen delivery system Gas      11/17/22 1456   11/16/22 1441  For home use only DME oxygen  Once       Question Answer Comment  Length of Need 6 Months   Liters per Minute 2   Frequency Continuous (stationary and portable oxygen unit needed)   Oxygen delivery system Gas      11/16/22 [place] Follow up.   Why: Home health has been arranged. They will contact you within 48hrs post discharge. Contact information: [address] [place] [address] [phone]                Allergies  Allergen Reactions   Codeine Nausea And Vomiting   Hydrocodone-Acetaminophen Nausea And Vomiting    Consultations: Palliative GI PCCM   Procedures/Studies: CT WRIST RIGHT WO CONTRAST  Result Date: 11/23/2022 CLINICAL DATA:  Wrist pain. EXAM: CT OF THE RIGHT WRIST WITHOUT CONTRAST TECHNIQUE: Multidetector CT imaging of the right wrist was performed according to the standard protocol. Multiplanar CT image reconstructions were  also generated. RADIATION DOSE REDUCTION: This exam was performed according to the departmental dose-optimization program which includes automated exposure control, adjustment of the mA and/or kV according to patient size and/or use of iterative reconstruction technique. COMPARISON:  Radiographs 11/22/2022 FINDINGS: Moderate wrist joint degenerative changes with joint space narrowing, subchondral cystic change and chondrocalcinosis. No acute fracture. No evidence of AVN. IMPRESSION: 1. Moderate wrist joint degenerative changes with chondrocalcinosis. 2. No acute fracture. Electronically Signed   By: Marijo Sanes M.D.   On: 11/23/2022 13:17   CT CERVICAL SPINE WO CONTRAST  Result Date: 11/23/2022 CLINICAL DATA:  Neck and right arm pain. EXAM: CT CERVICAL SPINE WITHOUT CONTRAST TECHNIQUE: Multidetector CT imaging of the cervical spine was performed without intravenous contrast. Multiplanar CT image reconstructions were also generated. RADIATION DOSE REDUCTION: This exam was performed according to the departmental dose-optimization program which includes automated exposure control, adjustment of the mA and/or kV according to patient size and/or use of iterative reconstruction technique. COMPARISON:  CT scan from 1,013 FINDINGS: Examination limited by patient motion. Alignment: Grossly normal alignment. Skull base and vertebrae: No acute cervical spine fracture. Progressive significant C1-2 degenerative changes with pannus formation. Soft tissues and spinal canal: No prevertebral fluid or swelling. No visible canal hematoma. Disc levels: The spinal canal is fairly generous. No obvious large disc protrusions or spinal stenosis. Multilevel foraminal stenosis due to uncinate spurring facet disease. Upper chest: Emphysematous changes are noted along with bilateral pleural effusions. Other: No neck mass, adenopathy or hematoma. Bilateral carotid artery calcifications. IMPRESSION: 1. Limited examination due to patient  motion. 2. No acute cervical spine fracture. 3. Progressive significant C1-2 degenerative changes with pannus formation. 4. Mild multilevel foraminal stenosis due to uncinate spurring and facet disease. 5. Bilateral pleural effusions. Electronically Signed   By: Marijo Sanes M.D.   On: 11/23/2022 13:14   DG Elbow 2  Views Right  Result Date: 11/23/2022 CLINICAL DATA:  Elbow pain. EXAM: RIGHT ELBOW - 2 VIEW COMPARISON:  None Available. FINDINGS: Two-view exam shows no fracture or dislocation. Lateral film reveals no fat pad elevation to suggest joint effusion. No worrisome lytic or sclerotic osseous abnormality. IMPRESSION: Negative. Electronically Signed   By: Misty Stanley M.D.   On: 11/23/2022 11:51   DG Shoulder Right Port  Result Date: 11/23/2022 CLINICAL DATA:  Shoulder pain. EXAM: RIGHT SHOULDER - 1 VIEW COMPARISON:  None Available. FINDINGS: Bones are markedly demineralized. No evidence for an acute fracture or dislocation. Degenerative changes are seen in the glenohumeral joint and in the acromioclavicular joint. No worrisome lytic or sclerotic osseous abnormality. Changes in the right lung better evaluated on chest x-ray performed earlier today. IMPRESSION: Degenerative changes without acute bony findings. Electronically Signed   By: Misty Stanley M.D.   On: 11/23/2022 11:48   DG Chest Port 1 View  Result Date: 11/23/2022 CLINICAL DATA:  Shortness of breath. EXAM: PORTABLE CHEST 1 VIEW COMPARISON:  11/16/2022 FINDINGS: Cardiomediastinal contours are stable. Aortic atherosclerotic calcifications. Bilateral pleural effusions are unchanged in the interval. Persistent diffuse increase interstitial markings identified compatible with pulmonary edema. New bilateral upper lobe airspace opacities may reflect areas of asymmetric alveolar edema or pneumonia. Bilateral areas of subsegmental atelectasis have increased from previous exam. IMPRESSION: 1. Persistent pulmonary edema and bilateral pleural  effusions. 2. New bilateral upper lobe airspace opacities may reflect areas of asymmetric alveolar edema or pneumonia. 3. Progressive bilateral areas of subsegmental atelectasis. Electronically Signed   By: Kerby Moors M.D.   On: 11/23/2022 07:17   VAS [place] UPPER EXTREMITY VENOUS DUPLEX  Result Date: 11/22/2022 UPPER VENOUS STUDY  Patient Name:  KERITH SHERLEY  Date of Exam:   11/22/2022 Medical Rec #: [id number]         Accession #:    [id number] Date of Birth: 1934/04/29         Patient Gender: F Patient Age:   54 years Exam Location:  [facility] Procedure:      VAS [place] UPPER EXTREMITY VENOUS DUPLEX Referring Phys: Laurey Arrow --------------------------------------------------------------------------------  Indications: Erythema, and Pain Risk Factors: None identified. Comparison Study: No prior studies. Performing Technologist: Oliver Hum RVT  Examination Guidelines: A complete evaluation includes B-mode imaging, spectral Doppler, color Doppler, and power Doppler as needed of all accessible portions of each vessel. Bilateral testing is considered an integral part of a complete examination. Limited examinations for reoccurring indications may be performed as noted.  Right Findings: +----------+------------+---------+-----------+----------+-------+ RIGHT     CompressiblePhasicitySpontaneousPropertiesSummary +----------+------------+---------+-----------+----------+-------+ IJV           Full       Yes       Yes                      +----------+------------+---------+-----------+----------+-------+ Subclavian    Full       Yes       Yes                      +----------+------------+---------+-----------+----------+-------+ Axillary      Full       Yes       Yes                      +----------+------------+---------+-----------+----------+-------+ Brachial      Full       Yes       Yes                       +----------+------------+---------+-----------+----------+-------+  Radial        Full                                          +----------+------------+---------+-----------+----------+-------+ Ulnar         Full                                          +----------+------------+---------+-----------+----------+-------+ Cephalic      Full                                          +----------+------------+---------+-----------+----------+-------+ Basilic       Full                                          +----------+------------+---------+-----------+----------+-------+  Left Findings: +----------+------------+---------+-----------+----------+-------+ LEFT      CompressiblePhasicitySpontaneousPropertiesSummary +----------+------------+---------+-----------+----------+-------+ Subclavian    Full       Yes       Yes                      +----------+------------+---------+-----------+----------+-------+  Summary:  Right: No evidence of deep vein thrombosis in the upper extremity. No evidence of superficial vein thrombosis in the upper extremity.  Left: No evidence of thrombosis in the subclavian.  *See table(s) above for measurements and observations.  Diagnosing physician: Harold Barban MD Electronically signed by Harold Barban MD on 11/22/2022 at 8:44:22 PM.    Final    DG Wrist 2 Views Right  Result Date: 11/22/2022 CLINICAL DATA:  Right wrist pain. EXAM: RIGHT WRIST - 2 VIEW COMPARISON:  None Available. FINDINGS: Linear lucency at the base of the radial styloid raises the question of nondisplaced radial styloid fracture. Heterogeneous mineralization in the distal radius and ulna is compatible with osteopenia and similar in appearance to contralateral wrist x-ray of 03/15/2016. Calcification of the TFCC noted. IMPRESSION: 1. Linear lucency at the base of the radial styloid raises the question of nondisplaced radial styloid fracture. CT or MRI could be used to further  evaluate as clinically warranted. 2. Osteopenia. Electronically Signed   By: Misty Stanley M.D.   On: 11/22/2022 17:37   DG ESOPHAGUS W SINGLE CM (SOL OR THIN BA)  Result Date: 11/17/2022 CLINICAL DATA:  Dysphagia, globus sensation EXAM: ESOPHAGUS/BARIUM SWALLOW/TABLET STUDY TECHNIQUE: Single contrast examination was performed using thin liquid barium. This exam was performed by Reatha Armour, PA-C , and was supervised and interpreted by Dr Abigail Miyamoto. FLUOROSCOPY: Radiation Exposure Index (as provided by the fluoroscopic device): 17.40 mGy Kerma COMPARISON:  None Available. FINDINGS: Limited study performed due to patient's inability to tolerate positioning. Esophagus: Normal appearance. Esophageal motility: Severe esophageal dysmotility with stasis of contrast material in mid and distal esophagus Hiatal Hernia: None visualized Gastroesophageal reflux: None visualized. Ingested 72mm barium tablet: Not given Other: None. IMPRESSION: Severe esophageal dysmotility, likely presbyesophagus. Limited, focused single-contrast exam performed. Electronically Signed   By: Abigail Miyamoto M.D.   On: 11/17/2022 12:17   DG CHEST PORT 1 VIEW  Result Date: 11/16/2022 CLINICAL DATA:  Acute respiratory failure EXAM: PORTABLE CHEST 1 VIEW  COMPARISON:  COMPARISON 11/11/2022 FINDINGS: Normal cardiac silhouette. Bilateral pleural effusions increased from prior. There is increased central venous congestion interstitial edema. Platelike atelectasis in the mid lungs. IMPRESSION: Increasing pleural effusions and interstitial edema Increased atelectasis in the mid lungs. Electronically Signed   By: Suzy Bouchard M.D.   On: 11/16/2022 11:06   CT ABDOMEN PELVIS WO CONTRAST  Result Date: 11/14/2022 CLINICAL DATA:  Nausea and vomiting. Bowel obstruction suspected. EXAM: CT ABDOMEN AND PELVIS WITHOUT CONTRAST TECHNIQUE: Multidetector CT imaging of the abdomen and pelvis was performed following the standard protocol without IV  contrast. RADIATION DOSE REDUCTION: This exam was performed according to the departmental dose-optimization program which includes automated exposure control, adjustment of the mA and/or kV according to patient size and/or use of iterative reconstruction technique. COMPARISON:  Abdominal radiograph earlier today. CT 06/18/2021 FINDINGS: Lower chest: Moderate right and small left pleural effusion, unchanged or equivocally improved from chest CT 2 days ago. Compressive as well as bandlike atelectasis in the included lung bases. Normal heart size with coronary artery calcifications. Hepatobiliary: The previous increased hepatic density is less pronounced on the current exam. No evidence of focal hepatic lesion. Gallbladder physiologically distended, no calcified stone. Stable common bile duct dilatation at 11 mm likely sequela of prior cholecystectomy. Pancreas: No ductal dilatation or inflammation. Spleen: Normal in size without focal abnormality. Adrenals/Urinary Tract: Normal adrenal glands. Lobulated left renal contours. No hydronephrosis. Simple cyst in the lower pole of the right kidney. No imaging follow-up is needed. No renal calculi. No evidence of solid renal lesion. There is slight retention of IV contrast from prior chest CTA. High-density bladder contents likely represents excretion of IV contrast from prior chest CT. No bladder wall thickening. Stomach/Bowel: The stomach is moderately distended with air and ingested contents. No gastric wall thickening or evidence of gastric outlet obstruction. There is no small bowel distention or small bowel obstruction. Majority of the small and large bowel is fluid-filled. Diverticulosis from the splenic flexure of the colon distally. No diverticulitis. No acute colonic inflammation. The appendix is not visualized. Vascular/Lymphatic: Moderate to advanced aortic atherosclerosis. No aortic aneurysm. No portal venous or mesenteric gas. No bulky abdominopelvic  adenopathy. Reproductive: Status post hysterectomy. No adnexal masses. Other: No free air, free fluid, or intra-abdominal fluid collection. Diminutive fat containing supra-umbilical hernia. Musculoskeletal: Prominent Schmorl's node involving superior endplates of L3 and L5, chronic. Moderate lower lumbar facet hypertrophy. There are no acute or suspicious osseous abnormalities. IMPRESSION: 1. No bowel obstruction. 2. Fluid-filled small and large bowel, can be seen with diarrheal illness. No bowel inflammation. Colonic diverticulosis without diverticulitis. 3. Moderate right and small left pleural effusions, unchanged or equivocally improved from chest CT 2 days ago. Aortic Atherosclerosis (ICD10-I70.0). Electronically Signed   By: Keith Rake M.D.   On: 11/14/2022 16:28   DG Abd 1 View  Result Date: 11/14/2022 CLINICAL DATA:  Nausea and vomiting EXAM: ABDOMEN - 1 VIEW COMPARISON:  CT Angio 11/12/22 FINDINGS: Marked gastric distention with ingested material. Small bilateral pleural effusions and bibasilar pulmonary opacities, unchanged. Surgical clips in the right upper quadrant. Paucity of bowel gas limits the ability to assess for infection. Within this limitation, no dilated loops of bowel are visualized no pneumatosis. No pneumoperitoneum. IMPRESSION: Marked gastric distention. Assessment for the presence of a small bowel obstruction is limited due to the paucity of small bowel gas. If there is significant clinical concern for a small bowel obstruction, further evaluation with a CT of the abdomen/pelvis is recommended. Electronically  Signed   By: Marin Roberts M.D.   On: 11/14/2022 13:00   ECHOCARDIOGRAM COMPLETE  Result Date: 11/13/2022    ECHOCARDIOGRAM REPORT   Patient Name:   [name] Date of Exam: 11/12/2022 Medical Rec #:  [id number]        Height:       62.0 in Accession #:    [id number]       Weight:       120.6 lb Date of Birth:  [birth year]        BSA:          1.542 m Patient Age:     69 years         BP:           130/70 mmHg Patient Gender: F                HR:           104 bpm. Exam Location:  Inpatient Procedure: 2D Echo, Cardiac Doppler and Color Doppler Indications:    Cardiomyopathy-Unspecified I42.9  History:        Patient has no prior history of Echocardiogram examinations and                 Patient has prior history of Echocardiogram examinations, most                 recent 07/09/2017. CAD, Stroke, Arrythmias:Atrial Fibrillation,                 Signs/Symptoms:Dyspnea, Shortness of Breath, Syncope and Chest                 Pain; Risk Factors:Hypertension and Diabetes.  Sonographer:    Ronny Flurry Sonographer#2:  Danne Baxter RDCS, FE, PE Referring Phys: [phone] ANKIT CHIRAG AMIN IMPRESSIONS  1. The aortic valve is calcified. There is moderate calcification of the aortic valve. There is moderate thickening of the aortic valve. Aortic valve regurgitation is mild. Moderate aortic valve stenosis. Aortic valve area, by VTI measures 1.37 cm. Aortic valve mean gradient measures 22.8 mmHg. Aortic valve Vmax measures 3.06 m/s.  2. Left ventricular ejection fraction, by estimation, is 70 to 75%. The left ventricle has hyperdynamic function. The left ventricle has no regional wall motion abnormalities. There is moderate left ventricular hypertrophy. Left ventricular diastolic parameters are indeterminate.  3. Right ventricular systolic function is normal. The right ventricular size is normal. There is moderately elevated pulmonary artery systolic pressure. The estimated right ventricular systolic pressure is 32.6 mmHg.  4. The mitral valve is degenerative. Mild mitral valve regurgitation. Mild mitral stenosis. The mean mitral valve gradient is 6.0 mmHg.  5. The inferior vena cava is normal in size with greater than 50% respiratory variability, suggesting right atrial pressure of 3 mmHg. FINDINGS  Left Ventricle: Left ventricular ejection fraction, by estimation, is 70 to 75%. The left  ventricle has hyperdynamic function. The left ventricle has no regional wall motion abnormalities. The left ventricular internal cavity size was normal in size. There is moderate left ventricular hypertrophy. Left ventricular diastolic parameters are indeterminate. Right Ventricle: The right ventricular size is normal. No increase in right ventricular wall thickness. Right ventricular systolic function is normal. There is moderately elevated pulmonary artery systolic pressure. The tricuspid regurgitant velocity is 3.25 m/s, and with an assumed right atrial pressure of 3 mmHg, the estimated right ventricular systolic pressure is 71.2 mmHg. Left Atrium: Left atrial size was normal in size. Right Atrium: Right atrial  size was normal in size. Pericardium: There is no evidence of pericardial effusion. Mitral Valve: The mitral valve is degenerative in appearance. There is mild thickening of the mitral valve leaflet(s). There is mild calcification of the mitral valve leaflet(s). Mild mitral valve regurgitation. Mild mitral valve stenosis. MV peak gradient, 17.6 mmHg. The mean mitral valve gradient is 6.0 mmHg. Tricuspid Valve: The tricuspid valve is normal in structure. Tricuspid valve regurgitation is mild . No evidence of tricuspid stenosis. Aortic Valve: The aortic valve is calcified. There is moderate calcification of the aortic valve. There is moderate thickening of the aortic valve. Aortic valve regurgitation is mild. Moderate aortic stenosis is present. Aortic valve mean gradient measures 22.8 mmHg. Aortic valve peak gradient measures 37.4 mmHg. Aortic valve area, by VTI measures 1.37 cm. Pulmonic Valve: The pulmonic valve was normal in structure. Pulmonic valve regurgitation is trivial. No evidence of pulmonic stenosis. Aorta: The aortic root is normal in size and structure. Venous: The inferior vena cava is normal in size with greater than 50% respiratory variability, suggesting right atrial pressure of 3 mmHg.  IAS/Shunts: No atrial level shunt detected by color flow Doppler.  LEFT VENTRICLE PLAX 2D LVIDd:         2.70 cm LVIDs:         1.70 cm LV PW:         1.20 cm LV IVS:        1.40 cm LVOT diam:     1.90 cm LV SV:         78 LV SV Index:   50 LVOT Area:     2.84 cm  RIGHT VENTRICLE             IVC RV S prime:     11.50 cm/s  IVC diam: 1.20 cm TAPSE (M-mode): 1.6 cm LEFT ATRIUM             Index        RIGHT ATRIUM           Index LA diam:        3.50 cm 2.27 cm/m   RA Area:     14.20 cm LA Vol (A2C):   31.9 ml 20.69 ml/m  RA Volume:   28.10 ml  18.22 ml/m LA Vol (A4C):   32.0 ml 20.75 ml/m LA Biplane Vol: 34.4 ml 22.31 ml/m  AORTIC VALVE AV Area (Vmax):    1.36 cm AV Area (Vmean):   1.39 cm AV Area (VTI):     1.37 cm AV Vmax:           305.80 cm/s AV Vmean:          226.000 cm/s AV VTI:            0.566 m AV Peak Grad:      37.4 mmHg AV Mean Grad:      22.8 mmHg LVOT Vmax:         147.00 cm/s LVOT Vmean:        110.933 cm/s LVOT VTI:          0.274 m LVOT/AV VTI ratio: 0.48  AORTA Ao Root diam: 2.90 cm Ao Asc diam:  3.00 cm MITRAL VALVE              TRICUSPID VALVE MV Area VTI:  2.03 cm    TR Peak grad:   42.2 mmHg MV Peak grad: 17.6 mmHg   TR Vmax:        325.00  cm/s MV Mean grad: 6.0 mmHg MV Vmax:      2.09 m/s    SHUNTS MV Vmean:     102.5 cm/s  Systemic VTI:  0.27 m                           Systemic Diam: 1.90 cm Candee Furbish MD Electronically signed by Candee Furbish MD Signature Date/Time: 11/13/2022/6:39:48 AM    Final    CT Angio Chest PE W and/or Wo Contrast  Result Date: 11/12/2022 CLINICAL DATA:  Pulmonary embolus suspected EXAM: CT ANGIOGRAPHY CHEST WITH CONTRAST TECHNIQUE: Multidetector CT imaging of the chest was performed using the standard protocol during bolus administration of intravenous contrast. Multiplanar CT image reconstructions and MIPs were obtained to evaluate the vascular anatomy. RADIATION DOSE REDUCTION: This exam was performed according to the departmental dose-optimization  program which includes automated exposure control, adjustment of the mA and/or kV according to patient size and/or use of iterative reconstruction technique. CONTRAST:  74mL OMNIPAQUE IOHEXOL 350 MG/ML SOLN COMPARISON:  Chest CT dated July 11th 2018 FINDINGS: Cardiovascular: No evidence of pulmonary embolus. Normal heart size. No pericardial effusion. Normal caliber thoracic aorta with severe calcified plaque. Severe coronary artery calcifications. Mediastinum/Nodes: Patulous esophagus. New mildly enlarged mediastinal and right hilar lymph nodes. Reference right lower paratracheal lymph node measuring 1.0 cm in short axis on series 5, image 38, previously 0.7 cm. Reference right hilar lymph node measuring 1.4 cm on series 5, image 46, previously 0.9 cm. Lungs/Pleura: Central airways are patent. Mosaic attenuation. Scattered linear opacities are seen in the lower lungs which are likely due to scarring or atelectasis. Visualized solid pulmonary nodules are unchanged in size when compared with prior exam. Reference peribronchovascular nodule of the right middle lobe measuring 2 mm in mean diameter on series 7, image 56, unchanged. Reference left lower lobe nodule measuring 6 mm on series 7, image 33, unchanged. Moderate right and small left pleural effusions with associated atelectasis. Upper Abdomen: No acute abnormality. Musculoskeletal: No chest wall abnormality. No acute or significant osseous findings. Review of the MIP images confirms the above findings. IMPRESSION: 1. No evidence of pulmonary embolus. 2. Moderate right and small left pleural effusions with associated atelectasis. 3. New mildly enlarged mediastinal and right hilar lymph nodes, likely reactive. Recommend follow-up chest CT in 3 months to ensure resolution. 4. 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 5. Mosaic  attenuation of the lungs, likely due to small airways disease. 6. Severe coronary artery calcifications. 7. Aortic Atherosclerosis (ICD10-I70.0). Electronically Signed   By: Yetta Glassman M.D.   On: 11/12/2022 11:04   DG Chest 2 View  Result Date: 11/11/2022 CLINICAL DATA:  Shortness of breath EXAM: CHEST - 2 VIEW COMPARISON:  12/30/2021 FINDINGS: Stable heart size. Aortic atherosclerosis. Streaky bilateral perihilar and bibasilar opacities, may reflect atelectasis. Small layering bilateral pleural effusions. Known bilateral pulmonary nodules, less conspicuous on the current exam. No pneumothorax. IMPRESSION: Streaky bilateral perihilar and bibasilar opacities, may reflect atelectasis. Small layering bilateral pleural effusions. Electronically Signed   By: Davina Poke D.O.   On: 11/11/2022 12:42   (Echo, Carotid, EGD, Colonoscopy, ERCP)    Subjective: Patient denies any fever, chills, reports dyspnea at baseline, denies any cough, fever or chills  Discharge Exam: Vitals:   11/26/22 0810 11/26/22 1043  BP: 121/67 116/78  Pulse: 100 (!) 107  Resp:  16 20  Temp:    SpO2: 93% 92%   Vitals:   11/26/22 0730 11/26/22 0733 11/26/22 0810 11/26/22 1043  BP:  119/63 121/67 116/78  Pulse:  92 100 (!) 107  Resp:  17 16 20   Temp:  97.7 F (36.5 C)    TempSrc:  Oral    SpO2: 90% (!) 89% 93% 92%  Weight:      Height:        General: Pt is alert, awake, not in acute distress, frail, deconditioned Cardiovascular: RRR, S1/S2 +, no rubs, no gallops Respiratory: CTA bilaterally, diminished air entry at the bases Abdominal: Soft, NT, ND, bowel sounds + Extremities: no edema, no cyanosis    The results of significant diagnostics from this hospitalization (including imaging, microbiology, ancillary and laboratory) are listed below for reference.     Microbiology: No results found for this or any previous visit (from the past 240 hour(s)).   Labs: BNP (last 3 results) Recent Labs     11/24/22 0323 11/25/22 0559 11/26/22 0817  BNP 301.1* 403.4* 119.1*   Basic Metabolic Panel: Recent Labs  Lab 11/22/22 0240 11/23/22 0253 11/24/22 0323 11/25/22 0559 11/26/22 0817  NA 131* 128* 126* 130* 133*  K 4.3 4.1 4.0 3.9 4.1  CL 92* 90* 87* 86* 96*  CO2 26 27 28 28 28   GLUCOSE 207* 190* 186* 150* 136*  BUN 34* 34* 43* 48* 45*  CREATININE 1.60* 1.36* 1.59* 1.59* 1.61*  CALCIUM 9.1 8.8* 8.5* 9.0 8.7*  MG  --   --  1.4* 2.6* 2.1   Liver Function Tests: Recent Labs  Lab 11/24/22 0320 11/25/22 0559 11/26/22 0817  AST 26 31 35  ALT 20 24 29   ALKPHOS 66 58 68  BILITOT 0.8 0.6 0.8  PROT 5.8* 6.0* 6.2*  ALBUMIN 2.6* 2.7* 2.8*   No results for input(s): "LIPASE", "AMYLASE" in the last 168 hours. No results for input(s): "AMMONIA" in the last 168 hours. CBC: Recent Labs  Lab 11/23/22 1453 11/24/22 0323 11/25/22 0559 11/26/22 0817  WBC 7.1 5.8 4.5 5.9  NEUTROABS  --  3.3 2.8 3.5  HGB 9.4* 9.2* 9.0* 9.4*  HCT 26.9* 25.6* 26.4* 27.6*  MCV 84.3 82.8 84.9 85.4  PLT 199 202 229 235   Cardiac Enzymes: No results for input(s): "CKTOTAL", "CKMB", "CKMBINDEX", "TROPONINI" in the last 168 hours. BNP: Invalid input(s): "POCBNP" CBG: Recent Labs  Lab 11/25/22 1213 11/25/22 1614 11/25/22 1655 11/25/22 2100 11/26/22 0738  GLUCAP 128* 219* 209* 148* 143*   D-Dimer No results for input(s): "DDIMER" in the last 72 hours. Hgb A1c No results for input(s): "HGBA1C" in the last 72 hours. Lipid Profile No results for input(s): "CHOL", "HDL", "LDLCALC", "TRIG", "CHOLHDL", "LDLDIRECT" in the last 72 hours. Thyroid function studies No results for input(s): "TSH", "T4TOTAL", "T3FREE", "THYROIDAB" in the last 72 hours.  Invalid input(s): "FREET3" Anemia work up No results for input(s): "VITAMINB12", "FOLATE", "FERRITIN", "TIBC", "IRON", "RETICCTPCT" in the last 72 hours. Urinalysis    Component Value Date/Time   COLORURINE YELLOW 07/08/2017 0306   APPEARANCEUR CLEAR  07/08/2017 0306   LABSPEC 1.024 07/08/2017 0306   PHURINE 5.0 07/08/2017 0306   GLUCOSEU NEGATIVE 07/08/2017 0306   HGBUR NEGATIVE 07/08/2017 0306   BILIRUBINUR NEGATIVE 07/08/2017 0306   KETONESUR NEGATIVE 07/08/2017 0306   PROTEINUR NEGATIVE 07/08/2017 0306   UROBILINOGEN 1.0 04/17/2015 1838   NITRITE NEGATIVE 07/08/2017 0306   LEUKOCYTESUR TRACE (A) 07/08/2017 0306   Sepsis Labs Recent Labs  Lab 11/23/22 1453 11/24/22 0323 11/25/22 0559 11/26/22 0817  WBC 7.1 5.8 4.5 5.9   Microbiology No results found for this or any previous visit (from the past 240 hour(s)).   Time coordinating discharge: Over 30 minutes  SIGNED:   Phillips Climes, MD  [facility] 11/26/2022, 11:23 AM Pager   If 7PM-7AM, please contact night-coverage [email or web address] Password [email or web address]

## 2022-11-26 NOTE — Plan of Care (Signed)

## 2022-11-26 NOTE — Progress Notes (Signed)
Physical Therapy Treatment Patient Details Name: [name] MRN: [id number] DOB: 1934/09/04 Today's Date: 11/26/2022   History of Present Illness 88 year old presented to [facility] on  11/11/22 with SOB. Pt developed afib with RVR in ED. Pt with nausea/vomiting likely due to esophageal dysmotility. 11/21 Pulmonary consulted, dyspnea, hypoxia multifactorial. PMH - CAD, pulmonary htn, afib, dm, htn, lung CA, stroke.    PT Comments    Patient eager  to participate with PT. Initiated session with ambulation. Patient on 3L and after 30 ft, sats decr to 85% and HR 118. Increased O2 to 4L with sats incr to 90% and HR down to 112. As approaching chair, pt began to complain of dizziness. BP assessed and had dropped ( re-BP121/67, post-BP 106/65). Pt reclined as did not feel she was improving in sitting. Feeling better after reclined.   Recommendations for follow up therapy are one component of a multi-disciplinary discharge planning process, led by the attending physician.  Recommendations may be updated based on patient status, additional functional criteria and insurance authorization.  Follow Up Recommendations  Skilled nursing-short term rehab (<3 hours/day) Can patient physically be transported by private vehicle: Yes   Assistance Recommended at Discharge Frequent or constant Supervision/Assistance  Patient can return home with the following Help with stairs or ramp for entrance;Assistance with cooking/housework;A little help with walking and/or transfers;A little help with bathing/dressing/bathroom;Assist for transportation   Equipment Recommendations  None recommended by PT    Recommendations for Other Services       Precautions / Restrictions Precautions Precautions: Fall;Other (comment) Precaution Comments: O2 sats and HR Restrictions Weight Bearing Restrictions: No     Mobility  Bed Mobility               General bed mobility comments: up in recliner when PT arrives     Transfers Overall transfer level: Needs assistance Equipment used: Rolling walker (2 wheels) Transfers: Sit to/from Stand Sit to Stand: Min guard           General transfer comment: closeguarding, but no physcial assist    Ambulation/Gait Ambulation/Gait assistance: Min assist Gait Distance (Feet): 50 Feet Assistive device: Rolling walker (2 wheels) Gait Pattern/deviations: Step-through pattern, Decreased stride length, Trunk flexed Gait velocity: reduced Gait velocity interpretation: <1.8 ft/sec, indicate of risk for recurrent falls   General Gait Details: pt feel shaky and required hands-on guarding for safety; did well maneuvering RW; became orthostatis and dizzy by end of walk   Stairs             Wheelchair Mobility    Modified Rankin (Stroke Patients Only)       Balance Overall balance assessment: Needs assistance Sitting-balance support: Feet supported Sitting balance-Leahy Scale: Fair     Standing balance support: Bilateral upper extremity supported, During functional activity Standing balance-Leahy Scale: Poor Standing balance comment: UE support req                            Cognition Arousal/Alertness: Awake/alert Behavior During Therapy: WFL for tasks assessed/performed Overall Cognitive Status: Within Functional Limits for tasks assessed                                 General Comments: pre-occupied with being cold and then after walking with being dizzy        Exercises      General Comments General comments (  skin integrity, edema, etc.): Patient desaturated to 85%on 3L while walking and incr to 4L with sats 90% for remainder of walk. Pt became dizzy while walking: pre-BP121/67, post-BP 106/65 with pt's legs elevated on recliner and pt reported feeling back to baseline in ~3 minutes      Pertinent Vitals/Pain Pain Assessment Pain Assessment: No/denies pain    Home Living                           Prior Function            PT Goals (current goals can now be found in the care plan section) Acute Rehab PT Goals Patient Stated Goal: get home and live independently PT Goal Formulation: With patient Time For Goal Achievement: 11/27/22 Potential to Achieve Goals: Good Progress towards PT goals: Progressing toward goals    Frequency    Min 3X/week      PT Plan Current plan remains appropriate    Co-evaluation              AM-PAC PT "6 Clicks" Mobility   Outcome Measure  Help needed turning from your back to your side while in a flat bed without using bedrails?: A Little Help needed moving from lying on your back to sitting on the side of a flat bed without using bedrails?: A Little Help needed moving to and from a bed to a chair (including a wheelchair)?: A Little Help needed standing up from a chair using your arms (e.g., wheelchair or bedside chair)?: A Little Help needed to walk in hospital room?: A Little Help needed climbing 3-5 steps with a railing? : A Lot 6 Click Score: 17    End of Session Equipment Utilized During Treatment: Oxygen;Gait belt Activity Tolerance: Treatment limited secondary to medical complications (Comment) (dizziness and orthostasis) Patient left: with call bell/phone within reach;in chair;with chair alarm set   PT Visit Diagnosis: Unsteadiness on feet (R26.81);Muscle weakness (generalized) (M62.81)     Time: 1448-1856 PT Time Calculation (min) (ACUTE ONLY): 20 min  Charges:  $Gait Training: 8-22 mins                      [place]  Office [phone]    Rexanne Mano 11/26/2022, 10:48 AM

## 2022-11-26 NOTE — Progress Notes (Signed)
Mobility Specialist Progress Note:   11/26/22 0900  Mobility  Activity Transferred from bed to chair  Level of Assistance Minimal assist, patient does 75% or more  Assistive Device Other (Comment) (HHA)  Distance Ambulated (ft) 3 ft  Activity Response Tolerated well  Mobility Referral Yes  $Mobility charge 1 Mobility   Pt eager to transfer to chair as breakfast just arrived. Required minA throughout transfer via HHA. Pt left with all needs met, chair alarm on.   Nelta Numbers Mobility Specialist Please contact via SecureChat or  Rehab office at [phone]

## 2022-11-26 NOTE — TOC Transition Note (Signed)
Transition of Care [facility]) - CM/SW Discharge Note   Patient Details  Name: [name] MRN: [id number] Date of Birth: October 15, 1934  [facility]) CM/SW Contact:  Benard Halsted, LCSW Phone Number: 11/26/2022, 1:44 PM   Clinical Narrative:    Patient will DC to: [facility] Anticipated DC date: 11/26/22 Family notified: Son, Remo Lipps Transport by: Corey Harold   Per MD patient ready for DC to [facility]. RN to call report prior to discharge [phone]). RN, patient, patient's family, and facility notified of DC. Discharge Summary and FL2 sent to facility. DC packet on chart. Ambulance transport requested for patient.   CSW will sign off for now as social work intervention is no longer needed. Please consult [place] again if new needs arise.     Final next level of care: Skilled Nursing Facility Barriers to Discharge: Barriers Resolved   Patient Goals and CMS Choice Patient states their goals for this hospitalization and ongoing recovery are:: return home CMS Medicare.gov Compare Post Acute Care list provided to:: Patient Choice offered to / list presented to : Patient  Discharge Placement   Existing PASRR number confirmed : 11/26/22          Patient [facility] bed at: [facility] Patient to be transferred to facility by: [place] Name of family member notified: Son Patient and family notified of of transfer: 11/26/22  Discharge Plan and Services   Discharge Planning Services: CM Consult Post Acute Care Choice: Home Health          DME Arranged: Oxygen DME Agency: [employer] Date DME Agency Contacted: 11/17/22 Time DME Agency Contacted: [id number] Representative spoke with at DME Agency: [place]: PT [place]: [place] ([place]) Date [place]: 11/14/22 Time [place]: [phone] Representative spoke with at [place]: [place] ([place]) Interventions     Readmission Risk  Interventions     No data to display

## 2022-11-28 DIAGNOSIS — R0609 Other forms of dyspnea: Secondary | ICD-10-CM | POA: Diagnosis not present

## 2022-11-28 DIAGNOSIS — I5033 Acute on chronic diastolic (congestive) heart failure: Secondary | ICD-10-CM | POA: Diagnosis not present

## 2022-11-28 DIAGNOSIS — M6281 Muscle weakness (generalized): Secondary | ICD-10-CM | POA: Diagnosis not present

## 2022-11-28 DIAGNOSIS — K219 Gastro-esophageal reflux disease without esophagitis: Secondary | ICD-10-CM | POA: Diagnosis not present

## 2022-11-28 DIAGNOSIS — D3A090 Benign carcinoid tumor of the bronchus and lung: Secondary | ICD-10-CM | POA: Diagnosis not present

## 2022-11-28 DIAGNOSIS — N183 Chronic kidney disease, stage 3 unspecified: Secondary | ICD-10-CM | POA: Diagnosis not present

## 2022-11-28 DIAGNOSIS — I480 Paroxysmal atrial fibrillation: Secondary | ICD-10-CM | POA: Diagnosis not present

## 2022-11-28 DIAGNOSIS — I2510 Atherosclerotic heart disease of native coronary artery without angina pectoris: Secondary | ICD-10-CM | POA: Diagnosis not present

## 2022-11-28 DIAGNOSIS — E871 Hypo-osmolality and hyponatremia: Secondary | ICD-10-CM | POA: Diagnosis not present

## 2022-11-28 DIAGNOSIS — N178 Other acute kidney failure: Secondary | ICD-10-CM | POA: Diagnosis not present

## 2022-11-28 DIAGNOSIS — J9601 Acute respiratory failure with hypoxia: Secondary | ICD-10-CM | POA: Diagnosis not present

## 2022-11-28 DIAGNOSIS — R112 Nausea with vomiting, unspecified: Secondary | ICD-10-CM | POA: Diagnosis not present

## 2022-12-01 ENCOUNTER — Emergency Department: Payer: Medicare Other

## 2022-12-01 ENCOUNTER — Other Ambulatory Visit: Payer: Self-pay

## 2022-12-01 ENCOUNTER — Inpatient Hospital Stay
Admission: EM | Admit: 2022-12-01 | Discharge: 2022-12-09 | DRG: 189 | Disposition: A | Discharge status: Skilled Nursing Facility | Payer: Medicare Other | Source: Skilled Nursing Facility | Attending: Student in an Organized Health Care Education/Training Program | Admitting: Student in an Organized Health Care Education/Training Program

## 2022-12-01 DIAGNOSIS — Z20822 Contact with and (suspected) exposure to covid-19: Secondary | ICD-10-CM | POA: Diagnosis present

## 2022-12-01 DIAGNOSIS — Z9981 Dependence on supplemental oxygen: Secondary | ICD-10-CM

## 2022-12-01 DIAGNOSIS — Z794 Long term (current) use of insulin: Secondary | ICD-10-CM

## 2022-12-01 DIAGNOSIS — E860 Dehydration: Secondary | ICD-10-CM | POA: Diagnosis present

## 2022-12-01 DIAGNOSIS — D649 Anemia, unspecified: Secondary | ICD-10-CM

## 2022-12-01 DIAGNOSIS — Z515 Encounter for palliative care: Secondary | ICD-10-CM

## 2022-12-01 DIAGNOSIS — Z66 Do not resuscitate: Secondary | ICD-10-CM | POA: Diagnosis not present

## 2022-12-01 DIAGNOSIS — Z8249 Family history of ischemic heart disease and other diseases of the circulatory system: Secondary | ICD-10-CM

## 2022-12-01 DIAGNOSIS — C3490 Malignant neoplasm of unspecified part of unspecified bronchus or lung: Secondary | ICD-10-CM

## 2022-12-01 DIAGNOSIS — R197 Diarrhea, unspecified: Secondary | ICD-10-CM | POA: Diagnosis not present

## 2022-12-01 DIAGNOSIS — E1122 Type 2 diabetes mellitus with diabetic chronic kidney disease: Secondary | ICD-10-CM | POA: Diagnosis present

## 2022-12-01 DIAGNOSIS — Z7901 Long term (current) use of anticoagulants: Secondary | ICD-10-CM

## 2022-12-01 DIAGNOSIS — I272 Pulmonary hypertension, unspecified: Secondary | ICD-10-CM | POA: Diagnosis not present

## 2022-12-01 DIAGNOSIS — I5032 Chronic diastolic (congestive) heart failure: Secondary | ICD-10-CM | POA: Diagnosis present

## 2022-12-01 DIAGNOSIS — E1165 Type 2 diabetes mellitus with hyperglycemia: Secondary | ICD-10-CM | POA: Diagnosis not present

## 2022-12-01 DIAGNOSIS — E440 Moderate protein-calorie malnutrition: Secondary | ICD-10-CM | POA: Insufficient documentation

## 2022-12-01 DIAGNOSIS — I4891 Unspecified atrial fibrillation: Secondary | ICD-10-CM | POA: Diagnosis present

## 2022-12-01 DIAGNOSIS — N178 Other acute kidney failure: Secondary | ICD-10-CM | POA: Diagnosis not present

## 2022-12-01 DIAGNOSIS — Z90710 Acquired absence of both cervix and uterus: Secondary | ICD-10-CM

## 2022-12-01 DIAGNOSIS — Y95 Nosocomial condition: Secondary | ICD-10-CM | POA: Diagnosis not present

## 2022-12-01 DIAGNOSIS — R0689 Other abnormalities of breathing: Secondary | ICD-10-CM | POA: Diagnosis not present

## 2022-12-01 DIAGNOSIS — E038 Other specified hypothyroidism: Secondary | ICD-10-CM | POA: Diagnosis not present

## 2022-12-01 DIAGNOSIS — E039 Hypothyroidism, unspecified: Secondary | ICD-10-CM | POA: Diagnosis not present

## 2022-12-01 DIAGNOSIS — C3492 Malignant neoplasm of unspecified part of left bronchus or lung: Secondary | ICD-10-CM | POA: Diagnosis not present

## 2022-12-01 DIAGNOSIS — I5033 Acute on chronic diastolic (congestive) heart failure: Secondary | ICD-10-CM | POA: Diagnosis not present

## 2022-12-01 DIAGNOSIS — R0902 Hypoxemia: Secondary | ICD-10-CM | POA: Diagnosis not present

## 2022-12-01 DIAGNOSIS — I482 Chronic atrial fibrillation, unspecified: Secondary | ICD-10-CM

## 2022-12-01 DIAGNOSIS — I480 Paroxysmal atrial fibrillation: Secondary | ICD-10-CM | POA: Diagnosis not present

## 2022-12-01 DIAGNOSIS — N183 Chronic kidney disease, stage 3 unspecified: Secondary | ICD-10-CM | POA: Diagnosis not present

## 2022-12-01 DIAGNOSIS — F419 Anxiety disorder, unspecified: Secondary | ICD-10-CM

## 2022-12-01 DIAGNOSIS — J90 Pleural effusion, not elsewhere classified: Secondary | ICD-10-CM | POA: Diagnosis not present

## 2022-12-01 DIAGNOSIS — Z6822 Body mass index (BMI) 22.0-22.9, adult: Secondary | ICD-10-CM

## 2022-12-01 DIAGNOSIS — E871 Hypo-osmolality and hyponatremia: Secondary | ICD-10-CM | POA: Diagnosis not present

## 2022-12-01 DIAGNOSIS — I5041 Acute combined systolic (congestive) and diastolic (congestive) heart failure: Secondary | ICD-10-CM | POA: Diagnosis not present

## 2022-12-01 DIAGNOSIS — Z833 Family history of diabetes mellitus: Secondary | ICD-10-CM

## 2022-12-01 DIAGNOSIS — R112 Nausea with vomiting, unspecified: Secondary | ICD-10-CM | POA: Diagnosis not present

## 2022-12-01 DIAGNOSIS — I10 Essential (primary) hypertension: Secondary | ICD-10-CM | POA: Diagnosis not present

## 2022-12-01 DIAGNOSIS — R0602 Shortness of breath: Secondary | ICD-10-CM | POA: Diagnosis not present

## 2022-12-01 DIAGNOSIS — C3491 Malignant neoplasm of unspecified part of right bronchus or lung: Secondary | ICD-10-CM | POA: Diagnosis present

## 2022-12-01 DIAGNOSIS — D3A090 Benign carcinoid tumor of the bronchus and lung: Secondary | ICD-10-CM | POA: Diagnosis not present

## 2022-12-01 DIAGNOSIS — E119 Type 2 diabetes mellitus without complications: Secondary | ICD-10-CM

## 2022-12-01 DIAGNOSIS — J189 Pneumonia, unspecified organism: Secondary | ICD-10-CM

## 2022-12-01 DIAGNOSIS — R0603 Acute respiratory distress: Secondary | ICD-10-CM | POA: Diagnosis not present

## 2022-12-01 DIAGNOSIS — R627 Adult failure to thrive: Secondary | ICD-10-CM | POA: Diagnosis not present

## 2022-12-01 DIAGNOSIS — R64 Cachexia: Secondary | ICD-10-CM | POA: Diagnosis not present

## 2022-12-01 DIAGNOSIS — I130 Hypertensive heart and chronic kidney disease with heart failure and stage 1 through stage 4 chronic kidney disease, or unspecified chronic kidney disease: Secondary | ICD-10-CM | POA: Diagnosis present

## 2022-12-01 DIAGNOSIS — I509 Heart failure, unspecified: Secondary | ICD-10-CM | POA: Diagnosis not present

## 2022-12-01 DIAGNOSIS — R0609 Other forms of dyspnea: Secondary | ICD-10-CM | POA: Diagnosis not present

## 2022-12-01 DIAGNOSIS — I2510 Atherosclerotic heart disease of native coronary artery without angina pectoris: Secondary | ICD-10-CM | POA: Diagnosis present

## 2022-12-01 DIAGNOSIS — Z823 Family history of stroke: Secondary | ICD-10-CM

## 2022-12-01 DIAGNOSIS — I4821 Permanent atrial fibrillation: Secondary | ICD-10-CM | POA: Diagnosis present

## 2022-12-01 DIAGNOSIS — J9601 Acute respiratory failure with hypoxia: Secondary | ICD-10-CM | POA: Diagnosis present

## 2022-12-01 DIAGNOSIS — N1832 Chronic kidney disease, stage 3b: Secondary | ICD-10-CM

## 2022-12-01 DIAGNOSIS — Z808 Family history of malignant neoplasm of other organs or systems: Secondary | ICD-10-CM

## 2022-12-01 DIAGNOSIS — Z8673 Personal history of transient ischemic attack (TIA), and cerebral infarction without residual deficits: Secondary | ICD-10-CM

## 2022-12-01 DIAGNOSIS — R54 Age-related physical debility: Secondary | ICD-10-CM | POA: Diagnosis present

## 2022-12-01 DIAGNOSIS — I499 Cardiac arrhythmia, unspecified: Secondary | ICD-10-CM | POA: Diagnosis not present

## 2022-12-01 DIAGNOSIS — J9621 Acute and chronic respiratory failure with hypoxia: Secondary | ICD-10-CM | POA: Diagnosis not present

## 2022-12-01 DIAGNOSIS — Z743 Need for continuous supervision: Secondary | ICD-10-CM | POA: Diagnosis not present

## 2022-12-01 DIAGNOSIS — K219 Gastro-esophageal reflux disease without esophagitis: Secondary | ICD-10-CM | POA: Diagnosis present

## 2022-12-01 DIAGNOSIS — Z7951 Long term (current) use of inhaled steroids: Secondary | ICD-10-CM

## 2022-12-01 DIAGNOSIS — Z803 Family history of malignant neoplasm of breast: Secondary | ICD-10-CM

## 2022-12-01 DIAGNOSIS — Z79890 Hormone replacement therapy (postmenopausal): Secondary | ICD-10-CM

## 2022-12-01 DIAGNOSIS — Z79899 Other long term (current) drug therapy: Secondary | ICD-10-CM

## 2022-12-01 DIAGNOSIS — R531 Weakness: Secondary | ICD-10-CM | POA: Diagnosis not present

## 2022-12-01 LAB — CBC WITH DIFFERENTIAL/PLATELET
Abs Immature Granulocytes: 0.07 K/uL (ref 0.00–0.07)
Basophils Absolute: 0 K/uL (ref 0.0–0.1)
Basophils Relative: 0 %
Eosinophils Absolute: 0 K/uL (ref 0.0–0.5)
Eosinophils Relative: 0 %
HCT: 29.4 % — ABNORMAL LOW (ref 36.0–46.0)
Hemoglobin: 9.1 g/dL — ABNORMAL LOW (ref 12.0–15.0)
Immature Granulocytes: 1 %
Lymphocytes Relative: 10 %
Lymphs Abs: 0.9 K/uL (ref 0.7–4.0)
MCH: 28.1 pg (ref 26.0–34.0)
MCHC: 31 g/dL (ref 30.0–36.0)
MCV: 90.7 fL (ref 80.0–100.0)
Monocytes Absolute: 1.2 K/uL — ABNORMAL HIGH (ref 0.1–1.0)
Monocytes Relative: 13 %
Neutro Abs: 6.8 K/uL (ref 1.7–7.7)
Neutrophils Relative %: 76 %
Platelets: 236 K/uL (ref 150–400)
RBC: 3.24 MIL/uL — ABNORMAL LOW (ref 3.87–5.11)
RDW: 14.1 % (ref 11.5–15.5)
WBC: 9.1 K/uL (ref 4.0–10.5)
nRBC: 0 % (ref 0.0–0.2)

## 2022-12-01 LAB — URINALYSIS, COMPLETE (UACMP) WITH MICROSCOPIC
Bilirubin Urine: NEGATIVE
Glucose, UA: NEGATIVE mg/dL
Hgb urine dipstick: NEGATIVE
Ketones, ur: NEGATIVE mg/dL
Nitrite: NEGATIVE
Protein, ur: NEGATIVE mg/dL
Specific Gravity, Urine: 1.017 (ref 1.005–1.030)
pH: 6 (ref 5.0–8.0)

## 2022-12-01 LAB — RESP PANEL BY RT-PCR (FLU A&B, COVID) ARPGX2
Influenza A by PCR: NEGATIVE
Influenza B by PCR: NEGATIVE
SARS Coronavirus 2 by RT PCR: NEGATIVE

## 2022-12-01 LAB — COMPREHENSIVE METABOLIC PANEL
ALT: 20 U/L (ref 0–44)
AST: 22 U/L (ref 15–41)
Albumin: 3.4 g/dL — ABNORMAL LOW (ref 3.5–5.0)
Alkaline Phosphatase: 84 U/L (ref 38–126)
Anion gap: 7 (ref 5–15)
BUN: 37 mg/dL — ABNORMAL HIGH (ref 8–23)
CO2: 30 mmol/L (ref 22–32)
Calcium: 9 mg/dL (ref 8.9–10.3)
Chloride: 98 mmol/L (ref 98–111)
Creatinine, Ser: 1.41 mg/dL — ABNORMAL HIGH (ref 0.44–1.00)
GFR, Estimated: 36 mL/min — ABNORMAL LOW (ref >60)
Glucose, Bld: 210 mg/dL — ABNORMAL HIGH (ref 70–99)
Potassium: 4.5 mmol/L (ref 3.5–5.1)
Sodium: 135 mmol/L (ref 135–145)
Total Bilirubin: 0.8 mg/dL (ref 0.3–1.2)
Total Protein: 7 g/dL (ref 6.5–8.1)

## 2022-12-01 LAB — LACTIC ACID, PLASMA
Lactic Acid, Venous: 1.7 mmol/L (ref 0.5–1.9)
Lactic Acid, Venous: 1.1 mmol/L (ref 0.5–1.9)

## 2022-12-01 LAB — TROPONIN I (HIGH SENSITIVITY)
Troponin I (High Sensitivity): 5 ng/L
Troponin I (High Sensitivity): 7 ng/L (ref <18)

## 2022-12-01 LAB — PROCALCITONIN: Procalcitonin: 0.23 ng/mL

## 2022-12-01 LAB — MAGNESIUM: Magnesium: 2.1 mg/dL (ref 1.7–2.4)

## 2022-12-01 LAB — BRAIN NATRIURETIC PEPTIDE: B Natriuretic Peptide: 228 pg/mL — ABNORMAL HIGH (ref 0.0–100.0)

## 2022-12-01 LAB — LIPASE, BLOOD: Lipase: 83 U/L — ABNORMAL HIGH (ref 11–51)

## 2022-12-01 MED ORDER — INSULIN DEGLUDEC 100 UNIT/ML ~~LOC~~ SOPN: 12.0000 [IU] | PEN_INJECTOR | Freq: Every day | SUBCUTANEOUS | Status: DC

## 2022-12-01 MED ORDER — LEVOTHYROXINE SODIUM 88 MCG PO TABS
88.0000 ug | ORAL_TABLET | Freq: Every day | ORAL | Status: DC
  Administered 2022-12-02 – 2022-12-09 (×8): 88 ug via ORAL
  Filled 2022-12-01 (×9): qty 1

## 2022-12-01 MED ORDER — DILTIAZEM HCL ER COATED BEADS 120 MG PO CP24
120.0000 mg | ORAL_CAPSULE | Freq: Every day | ORAL | Status: DC
  Administered 2022-12-02 – 2022-12-05 (×4): 120 mg via ORAL
  Filled 2022-12-01 (×4): qty 1

## 2022-12-01 MED ORDER — SODIUM CHLORIDE 0.9 % IV BOLUS
1000.0000 mL | Freq: Once | INTRAVENOUS | Status: AC
  Administered 2022-12-01: 1000 mL via INTRAVENOUS

## 2022-12-01 MED ORDER — IPRATROPIUM-ALBUTEROL 0.5-2.5 (3) MG/3ML IN SOLN
3.0000 mL | Freq: Four times a day (QID) | RESPIRATORY_TRACT | Status: DC
  Administered 2022-12-01 – 2022-12-02 (×4): 3 mL via RESPIRATORY_TRACT
  Filled 2022-12-01 (×4): qty 3

## 2022-12-01 MED ORDER — UMECLIDINIUM BROMIDE 62.5 MCG/ACT IN AEPB
1.0000 | INHALATION_SPRAY | Freq: Every day | RESPIRATORY_TRACT | Status: DC
  Administered 2022-12-03 – 2022-12-09 (×7): 1 via RESPIRATORY_TRACT
  Filled 2022-12-01 (×3): qty 7

## 2022-12-01 MED ORDER — IPRATROPIUM-ALBUTEROL 0.5-2.5 (3) MG/3ML IN SOLN: 3.0000 mL | Freq: Four times a day (QID) | RESPIRATORY_TRACT | Status: DC | PRN

## 2022-12-01 MED ORDER — INSULIN GLARGINE-YFGN 100 UNIT/ML ~~LOC~~ SOLN
6.0000 [IU] | Freq: Every day | SUBCUTANEOUS | Status: DC
  Administered 2022-12-01 – 2022-12-02 (×2): 6 [IU] via SUBCUTANEOUS
  Filled 2022-12-01 (×3): qty 0.06

## 2022-12-01 MED ORDER — ONDANSETRON HCL 4 MG PO TABS: 4.0000 mg | ORAL_TABLET | Freq: Four times a day (QID) | ORAL | Status: DC | PRN

## 2022-12-01 MED ORDER — ONDANSETRON HCL 4 MG/2ML IJ SOLN: 4.0000 mg | Freq: Four times a day (QID) | INTRAMUSCULAR | Status: DC | PRN

## 2022-12-01 MED ORDER — ACETAMINOPHEN 325 MG PO TABS
650.0000 mg | ORAL_TABLET | Freq: Four times a day (QID) | ORAL | Status: DC | PRN
  Administered 2022-12-02 – 2022-12-06 (×2): 650 mg via ORAL
  Filled 2022-12-01 (×2): qty 2

## 2022-12-01 MED ORDER — ARFORMOTEROL TARTRATE 15 MCG/2ML IN NEBU
15.0000 ug | INHALATION_SOLUTION | Freq: Two times a day (BID) | RESPIRATORY_TRACT | Status: DC
  Administered 2022-12-01 – 2022-12-09 (×16): 15 ug via RESPIRATORY_TRACT
  Filled 2022-12-01 (×18): qty 2

## 2022-12-01 MED ORDER — SODIUM CHLORIDE 0.9 % IV SOLN
2.0000 g | INTRAVENOUS | Status: AC
  Administered 2022-12-02 – 2022-12-07 (×6): 2 g via INTRAVENOUS
  Filled 2022-12-01: qty 12.5
  Filled 2022-12-01: qty 2
  Filled 2022-12-01 (×2): qty 12.5
  Filled 2022-12-01: qty 2
  Filled 2022-12-01: qty 12.5

## 2022-12-01 MED ORDER — METOPROLOL SUCCINATE ER 25 MG PO TB24
25.0000 mg | ORAL_TABLET | Freq: Every day | ORAL | Status: DC
  Administered 2022-12-02 – 2022-12-03 (×2): 25 mg via ORAL
  Filled 2022-12-01 (×2): qty 1

## 2022-12-01 MED ORDER — BUDESONIDE 0.5 MG/2ML IN SUSP
0.5000 mg | Freq: Two times a day (BID) | RESPIRATORY_TRACT | Status: DC
  Administered 2022-12-01 – 2022-12-09 (×15): 0.5 mg via RESPIRATORY_TRACT
  Filled 2022-12-01 (×15): qty 2

## 2022-12-01 MED ORDER — INSULIN ASPART 100 UNIT/ML IJ SOLN
0.0000 [IU] | Freq: Three times a day (TID) | INTRAMUSCULAR | Status: DC
  Administered 2022-12-02 (×2): 2 [IU] via SUBCUTANEOUS
  Administered 2022-12-03: 5 [IU] via SUBCUTANEOUS
  Administered 2022-12-03 – 2022-12-05 (×5): 9 [IU] via SUBCUTANEOUS
  Filled 2022-12-01 (×8): qty 1

## 2022-12-01 MED ORDER — ROSUVASTATIN CALCIUM 10 MG PO TABS
20.0000 mg | ORAL_TABLET | Freq: Every day | ORAL | Status: DC
  Administered 2022-12-02 – 2022-12-05 (×4): 20 mg via ORAL
  Filled 2022-12-01: qty 2
  Filled 2022-12-01: qty 1
  Filled 2022-12-01 (×2): qty 2

## 2022-12-01 MED ORDER — VANCOMYCIN HCL IN DEXTROSE 1-5 GM/200ML-% IV SOLN
1000.0000 mg | Freq: Once | INTRAVENOUS | Status: AC
  Administered 2022-12-01: 1000 mg via INTRAVENOUS
  Filled 2022-12-01: qty 200

## 2022-12-01 MED ORDER — POLYETHYLENE GLYCOL 3350 17 G PO PACK
17.0000 g | PACK | Freq: Every day | ORAL | Status: DC | PRN
  Administered 2022-12-05: 17 g via ORAL
  Filled 2022-12-01: qty 1

## 2022-12-01 MED ORDER — ACETAMINOPHEN 650 MG RE SUPP: 650.0000 mg | Freq: Four times a day (QID) | RECTAL | Status: DC | PRN

## 2022-12-01 MED ORDER — SODIUM CHLORIDE 0.9% FLUSH
3.0000 mL | Freq: Two times a day (BID) | INTRAVENOUS | Status: DC
  Administered 2022-12-01 – 2022-12-09 (×15): 3 mL via INTRAVENOUS

## 2022-12-01 MED ORDER — DILTIAZEM HCL 25 MG/5ML IV SOLN
5.0000 mg | Freq: Once | INTRAVENOUS | Status: AC
  Administered 2022-12-01: 5 mg via INTRAVENOUS
  Filled 2022-12-01: qty 5

## 2022-12-01 MED ORDER — APIXABAN 2.5 MG PO TABS
2.5000 mg | ORAL_TABLET | Freq: Two times a day (BID) | ORAL | Status: DC
  Administered 2022-12-01 – 2022-12-09 (×16): 2.5 mg via ORAL
  Filled 2022-12-01 (×17): qty 1

## 2022-12-01 MED ORDER — VANCOMYCIN HCL IN DEXTROSE 1-5 GM/200ML-% IV SOLN: 1000.0000 mg | INTRAVENOUS | Status: DC

## 2022-12-01 MED ORDER — SODIUM CHLORIDE 0.9 % IV SOLN
2.0000 g | Freq: Once | INTRAVENOUS | Status: AC
  Administered 2022-12-01: 2 g via INTRAVENOUS
  Filled 2022-12-01: qty 12.5

## 2022-12-01 MED ORDER — ENSURE ENLIVE PO LIQD
237.0000 mL | Freq: Two times a day (BID) | ORAL | Status: DC
  Administered 2022-12-02 – 2022-12-03 (×2): 237 mL via ORAL

## 2022-12-01 NOTE — Assessment & Plan Note (Addendum)
Patient presenting with 1 day history of increased shortness of breath and cough with hypoxia noted as low as 85% on her home oxygen.  She initially required up to 6 L to maintain oxygen saturation and subsequently advanced to heated high flow nasal cannula due to increased work of breathing.  At this time, she is tolerating it well with oxygen saturation ranging around 93-94%. Differential includes hospital-acquired pneumonia given recent hospitalization versus aspiration pneumonia given emesis yesterday with known severe esophageal dysmotility.  BNP is improved compared to prior, so less likely CHF related.  Procalcitonin elevated compared to prior at 0.23.  - Continue HHFNC to maintain oxygen saturation above 88% - Continue vancomycin and cefepime - MRSA PCR pending.  If negative, discontinue vancomycin - RVP panel pending - Consider de-escalation once clinical improvement occurs - Urinary strep pneumo and Legionella antigens pending - Dysphagia 3/mechanically soft diet

## 2022-12-01 NOTE — ED Provider Triage Note (Signed)
Emergency Medicine Provider Triage Evaluation Note  DEAJA RIZO , a 88 y.o. female  was evaluated in triage.  Pt complains of SOB.  Patient has a history of A-fib on Eliquis, neuroendocrine tumor of the lungs who comes in with shortness of breath.  Review of Systems  Positive: SOB Negative: Fever   Physical Exam  Pulse (!) 143   Resp (!) 34   SpO2 91%  Gen:   Awake, no distress   Resp:  Normal effort increased WOB  MSK:   Moves extremities without difficulty  Other:    Medical Decision Making  Medically screening exam initiated at 3:03 PM.  Appropriate orders placed.  TRACYANN DUFFELL was informed that the remainder of the evaluation will be completed by another provider, this initial triage assessment does not replace that evaluation, and the importance of remaining in the ED until their evaluation is complete.  Labs/xray Patient increased to 6 L.  Patient is in her room but patient was handed off to oncoming team due to shift change   Vanessa [place], MD 12/01/22 1504

## 2022-12-01 NOTE — Assessment & Plan Note (Signed)
Renal function stable.  -Trend BMP while admitted

## 2022-12-01 NOTE — ED Notes (Signed)
Writer obtained pillow and placed under patient's left side to relieve pressure from coccyx

## 2022-12-01 NOTE — Progress Notes (Signed)
Pharmacy Antibiotic Note  [name] is a 88 y.o. female admitted on 12/01/2022 with pneumonia.  Pharmacy has been consulted for vancomycin and cefepime dosing.  Received vancomycin 1,000 mg (~20 mg/kg) @1642  and cefepime 2 grams @1634 . Scr 1.41 consistent with baseline (Crcl 10-30 mL/min). IBW 50 kg  Plan: Vancomycin 1,000 mg every 48 hours  Goal AUC 400-600 Estimated AUC 514.4, Cmin 11.4 (TBW; Vd 0.72)  Cefepime 2 grams every 24 hours  Follow up MRSA PCR, renal function, cultures.   Height: 5\' 2"  (157.5 cm) Weight: 54.4 kg (120 lb) IBW/kg (Calculated) : 50.1  Temp (24hrs), Avg:98.3 F (36.8 C), Min:97.9 F (36.6 C), Max:98.7 F (37.1 C)  Recent Labs  Lab 11/25/22 0559 11/26/22 0817 12/01/22 1503 12/01/22 1647  WBC 4.5 5.9 9.1  --   CREATININE 1.59* 1.61* 1.41*  --   LATICACIDVEN  --   --  1.7 1.1    Estimated Creatinine Clearance: 22.7 mL/min (A) (by C-G formula based on SCr of 1.41 mg/dL (H)).    Allergies  Allergen Reactions   Codeine Nausea And Vomiting   Hydrocodone-Acetaminophen Nausea And Vomiting    Antimicrobials this admission: vancomycin 12/4 >>  cefepime 12/4 >>   Dose adjustments this admission: N/a  Microbiology results: 12/4 BCx: to be collected 12/4 UCx: in process  12/4 MRSA PCR: to be collected  Thank you for allowing pharmacy to be a part of this patient's care.  Wynelle Cleveland 12/01/2022 8:31 PM

## 2022-12-01 NOTE — ED Notes (Signed)
Updated patient's son, [name] with patient permission.

## 2022-12-01 NOTE — Assessment & Plan Note (Addendum)
Patient presenting with atrial fibrillation and RVR likely due to patient's hypoxia and underlying illness with dehydration.  Her heart rate responded well to IV fluid resuscitation however she continues to maintain around 110-120.  - Restart home metoprolol and diltiazem - One-time dose of delta 5 mg ordered - If patient's heart rate increases, will start IV diltiazem infusion

## 2022-12-01 NOTE — ED Provider Notes (Addendum)
[facility] Provider Note    Event Date/Time   First MD Initiated Contact with Patient 12/01/22 1508     (approximate)   History   Respiratory Distress   HPI  [name] is a 88 y.o. female   Past medical history of AF on eliquis, neuroendocrine tumor lungs,  CAD, pulmonary hypertension, diabetes, hypertension, acute on chronic diastolic CHF, who presents to the emergency department with shortness of breath cough and poor p.o. intake over the last several days.  She comes in from a rehab facility after being discharged from the hospital on 11/26/2022 for acute hypoxemic respiratory failure in the setting of pleural effusions and status post diuresis.  She denies chest pain, abdominal pain.  Mild nausea.  She has had a cough for several days, no fever or chills.  She has had increasing shortness of breath.  She has been compliant with all medications.  Given her general feelings of unwellness she has had poor p.o. intake over the last several days as well.  He denies urinary symptoms or diarrhea or bleeding.  History was obtained via the patient.  I also reviewed a discharge summary dated 11/26/2022 hospitalization for acute hypoxemic respiratory failure status post diuresis.      Physical Exam   Triage Vital Signs: ED Triage Vitals  Enc Vitals Group     BP 12/01/22 1504 116/84     Pulse Rate 12/01/22 1502 (!) 143     Resp 12/01/22 1502 (!) 34     Temp --      Temp src --      SpO2 12/01/22 1458 (!) 85 %     Weight --      Height --      Head Circumference --      Peak Flow --      Pain Score 12/01/22 1459 0     Pain Loc --      Pain Edu? --      Excl. in [place]? --     Most recent vital signs: Vitals:   12/01/22 1545 12/01/22 1600  BP: 104/69 127/72  Pulse:  99  Resp: (!) 28 (!) 23  Temp:    SpO2: 92% 94%    General: Awake, conversant. CV:  Because membranes appear slightly dry her skin turgor is poor she looks slightly  dehydrated. Resp:  [place] rate is increased at 34 and she has no obvious focalities rales or wheezing on my exam. Abd:  No distention.  No rigidity or tenderness to palpation. Other:  She is tachycardic to 140s irregular rate but normotensive.  Oxygen saturations were 85% on her normal home O2 requirements so she was increased to 6 L nasal cannula with improvement to the mid 90s.   ED Results / Procedures / Treatments   Labs (all labs ordered are listed, but only abnormal results are displayed) Labs Reviewed  CBC WITH DIFFERENTIAL/PLATELET - Abnormal; Notable for the following components:      Result Value   RBC 3.24 (*)    Hemoglobin 9.1 (*)    HCT 29.4 (*)    Monocytes Absolute 1.2 (*)    All other components within normal limits  COMPREHENSIVE METABOLIC PANEL - Abnormal; Notable for the following components:   Glucose, Bld 210 (*)    BUN 37 (*)    Creatinine, Ser 1.41 (*)    Albumin 3.4 (*)    GFR, Estimated 36 (*)    All other components within  normal limits  LIPASE, BLOOD - Abnormal; Notable for the following components:   Lipase 83 (*)    All other components within normal limits  BRAIN NATRIURETIC PEPTIDE - Abnormal; Notable for the following components:   B Natriuretic Peptide 228.0 (*)    All other components within normal limits  BLOOD GAS, VENOUS - Abnormal; Notable for the following components:   pH, Ven 7.44 (*)    Bicarbonate 32.6 (*)    Acid-Base Excess 7.2 (*)    All other components within normal limits  RESP PANEL BY RT-PCR (FLU A&B, COVID) ARPGX2  URINE CULTURE  CULTURE, BLOOD (ROUTINE X 2)  CULTURE, BLOOD (ROUTINE X 2)  MAGNESIUM  LACTIC ACID, PLASMA  LACTIC ACID, PLASMA  URINALYSIS, COMPLETE (UACMP) WITH MICROSCOPIC  TROPONIN I (HIGH SENSITIVITY)     I reviewed labs and they are notable for a venous blood gas with a normal pH and a PCO2 of 48, hemoglobin is 9.1 stable from prior and no significant leukocytosis.  EKG  ED ECG REPORT I, Lucillie Garfinkel, the attending physician, personally viewed and interpreted this ECG.   Date: 12/01/2022  EKG Time: 1501  Rate: 156  Rhythm: atrial fibrillation, rate 150  Axis: nl  Intervals:non  ST&T Change: No acute ischemic changes    RADIOLOGY I independently reviewed and interpreted chest x-ray and see no obvious signs of overt fluid overload; in fact it is much more clear and improved from her prior hospitalization when she was experiencing pleural effusions and pulmonary edema.   PROCEDURES:  Critical Care performed: Yes, see critical care procedure note(s)  .Critical Care  Performed by: Lucillie Garfinkel, MD Authorized by: Lucillie Garfinkel, MD   Critical care provider statement:    Critical care time (minutes):  30   Critical care was necessary to treat or prevent imminent or life-threatening deterioration of the following conditions:  Respiratory failure (AF RVR)   Critical care was time spent personally by me on the following activities:  Development of treatment plan with patient or surrogate, discussions with consultants, evaluation of patient's response to treatment, examination of patient, ordering and review of laboratory studies, ordering and review of radiographic studies, ordering and performing treatments and interventions, pulse oximetry, re-evaluation of patient's condition and review of old charts    MEDICATIONS ORDERED IN ED: Medications  ceFEPIme (MAXIPIME) 2 g in sodium chloride 0.9 % 100 mL IVPB (has no administration in time range)  vancomycin (VANCOCIN) IVPB 1000 mg/200 mL premix (has no administration in time range)  sodium chloride 0.9 % bolus 1,000 mL (0 mLs Intravenous Stopped 12/01/22 1546)    Consultants:  I spoke with hospitalist regarding care plan for this patient.   IMPRESSION / MDM / ASSESSMENT AND PLAN / ED COURSE  I reviewed the triage vital signs and the nursing notes.                              Differential diagnosis includes, but is not limited to,  atrial fibrillation with rapid ventricular response in the setting of dehydration, electrolyte derangements, infection, sepsis, cardiac failure, progression of cancer burden, pleural effusions, fluid overload, ACS   The patient is on the cardiac monitor to evaluate for evidence of arrhythmia and/or significant heart rate changes.  MDM: Patient with complicated medical history who presents with shortness of breath, cough and poor p.o. intake and atrial fibrillation with RVR.  She does appear slightly clinically dehydrated and tachycardia  may be compensatory, however complicated by history of pulmonary hypertension we will trial a small fluid aliquot of 500 cc to assess for fluid responsiveness prior to further interventions.  Currently she is normotensive so after trial of fluids and initial laboratory testing, chest x-ray, will assess for either rate control with pharmacological interventions versus ongoing fluid resuscitation.  Given cough, tachycardia, increased respiratory rate initiated sepsis work-up including blood cultures, lactic, chest x-ray and urinalysis and will initiate broad-spectrum antibiotics while awaiting further testing results.  Her chest x-ray shows no remarkable pulmonary edema and she was fluid responsive now her heart rate is in the 100s after 500 cc of normal saline.  No white blood cell count and normal lactic acid.  Due to her respiratory rate and slight increased work of breathing I opted for high flow nasal cannula in place of noninvasive positive pressure support given her history of pulmonary hypertension and her oxygen levels are much improved.  She remains normotensive.  She has received antibiotics Vanco and cefepime and has evidence of questionable pneumonia on her chest x-ray.  Will be admitted to the hospitalist service for hypoxemic resp failure.    Patient's presentation is most consistent with acute presentation with potential threat to life or bodily  function.       FINAL CLINICAL IMPRESSION(S) / ED DIAGNOSES   Final diagnoses:  Acute hypoxemic respiratory failure ([place])     Rx / DC Orders   ED Discharge Orders     None        Note:  This document was prepared using Dragon voice recognition software and may include unintentional dictation errors.    Lucillie Garfinkel, MD 12/01/22 1622    Lucillie Garfinkel, MD 12/01/22 [phone]

## 2022-12-01 NOTE — Assessment & Plan Note (Signed)
-   Hold home regimen - Semglee 6 units prior to bedtime - SSI, sensitive

## 2022-12-01 NOTE — Assessment & Plan Note (Signed)
-   Continue home apixaban and statin

## 2022-12-01 NOTE — ED Notes (Signed)
PER DR. Jacelyn Grip ONLY GIVE PATIENT 500ML NS

## 2022-12-01 NOTE — ED Triage Notes (Signed)
BIBA from facility [place] for respiratory distress. Found hypoxic on home dose of 3L oxygen, increased to 6L then 10L NRB with improvement of pulse ox.   PT arrives with RR in the 30's 92% on 6L [place]. HR elevated in 140s, EKG reveals afib RVR. Pt alert and oriented x 4.  PT reports she got sick this AM and developed SOB this afternoon before EMS arrived.

## 2022-12-01 NOTE — H&P (Signed)
History and Physical    Patient: [name] LOV:[id number] DOB: 1934-01-06 DOA: 12/01/2022 DOS: the patient was seen and examined on 12/01/2022 PCP: [name], FNP  Patient coming from: Home  Chief Complaint:  Chief Complaint  Patient presents with   Respiratory Distress   HPI: [name] is a 88 y.o. female with medical history significant of CAD, pulmonary hypertension, permanent atrial fibrillation, type 2 diabetes, hypertension, grade 4 low-grade lung carcinoid/neuroendocrine tumor, who presents to the ED with complaints of increased shortness of breath.  Mrs. Dosch states that she has been experiencing progressively worsening shortness of breath and dyspnea on exertion for the last couple months.  Since she has been discharged to SNF, her breathing has not significantly improved.  Yesterday, she had an episode of nausea and vomiting after eating and states this happens every time she eats.  She notes that she has a history of GERD and her stomach is extremely sensitive.  Her emesis since yesterday was nonbloody.  Today, she noted increased shortness of breath and cough.  She denies any fever, chills, chest pain, palpitations.  She denies any additional episodes of emesis today.  Of note, patient was recently admitted from 11/14 to 11/29 for acute hypoxic respiratory failure felt to be secondary to acute on chronic HFpEF and stage IV low-grade lung carcinoid tumor.  She was discharged on supplemental oxygen, 2-3 L to maintain oxygen saturation above 88%.  ED course: On arrival to the ED, patient was tachycardic up to 151 with blood pressure of 127/72.  She was hypoxic at 85 and subsequently required increased up to 6 L.  Due to increased work of breathing, patient subsequently required heated high flow nasal cannula.  Initial workup workup remarkable for normal pCO2.BNP improved compared to prior at 228.  White blood count within normal limits.  Influenza and COVID-19  PCR negative.  Chest x-ray with bibasilar collapse/consolidation due to atelectasis versus pneumonia.  Patient started on vancomycin and cefepime for concern of HCAP.  TRH consulted for admission.  Review of Systems: As mentioned in the history of present illness. All other systems reviewed and are negative.  Past Medical History:  Diagnosis Date   Acute diverticulitis 06/2017   Anemia 06/2017   CAD (coronary artery disease) 03/13/2012   Diabetes mellitus    Dysrhythmia    ATRIAL FIB   Fall 07/07/2017   HTN (hypertension) 03/13/2012   Hypothyroid 03/13/2012   Lung cancer ([place])    Shortness of breath    Stroke ([place])    Syncope and collapse 04/02/2016   Type II or unspecified type diabetes mellitus without mention of complication, not stated as uncontrolled 03/13/2012   Past Surgical History:  Procedure Laterality Date   ABDOMINAL HYSTERECTOMY     BIOPSY  06/13/2021   Procedure: BIOPSY;  Surgeon: Ronnette Juniper, MD;  Location: [facility];  Service: Gastroenterology;;   BREAST EXCISIONAL BIOPSY Left    BREAST SURGERY     benign tumors removed in [place]  04/20/2012   Procedure: CARDIOVERSION;  Surgeon: Laverda Page, MD;  Location: [place];  Service: Cardiovascular;  Laterality: N/A;   CARDIOVERSION N/A 05/10/2013   Procedure: CARDIOVERSION;  Surgeon: Laverda Page, MD;  Location: [place];  Service: Cardiovascular;  Laterality: N/A;   CHOLECYSTECTOMY     COLONOSCOPY WITH PROPOFOL N/A 06/13/2021   Procedure: COLONOSCOPY WITH PROPOFOL;  Surgeon: Ronnette Juniper, MD;  Location: [facility];  Service:  Gastroenterology;  Laterality: N/A;   ESOPHAGOGASTRODUODENOSCOPY (EGD) WITH PROPOFOL N/A 06/13/2021   Procedure: ESOPHAGOGASTRODUODENOSCOPY (EGD) WITH PROPOFOL;  Surgeon: Ronnette Juniper, MD;  Location: [facility];  Service: Gastroenterology;  Laterality: N/A;   HEMORRHOID SURGERY     jaw replacement     LEFT HEART CATHETERIZATION WITH CORONARY  ANGIOGRAM N/A 04/20/2015   Procedure: LEFT HEART CATHETERIZATION WITH CORONARY ANGIOGRAM;  Surgeon: Adrian Prows, MD;  [facility] C[facility];  Service: Cardiovascular;  Laterality: N/A;   tumors     back of head   Social History:  reports that she has never smoked. She has never used smokeless tobacco. She reports that she does not drink alcohol and does not use drugs.  Allergies  Allergen Reactions   Codeine Nausea And Vomiting   Hydrocodone-Acetaminophen Nausea And Vomiting    Family History  Problem Relation Age of Onset   Breast cancer Mother    Aneurysm Father        Likely thoracic aneurysm per patient   Hypertension Sister    Heart disease Sister    Diabetes type II Sister    Diabetes type II Sister    Diverticulitis Sister    Diabetes type II Sister    Stroke Sister    Heart disease Brother    Melanoma Brother    Melanoma Brother     Prior to Admission medications   Medication Sig Start Date End Date Taking? Authorizing Provider  acetaminophen (TYLENOL) 325 MG tablet Take 2 tablets (650 mg total) by mouth every 6 (six) hours as needed for mild pain, fever or headache. 11/26/22   Elgergawy, Silver Huguenin, MD  apixaban (ELIQUIS) 2.5 MG TABS tablet Take 1 tablet (2.5 mg total) by mouth 2 (two) times daily. 11/26/22   Elgergawy, Silver Huguenin, MD  budesonide (PULMICORT) 0.5 MG/2ML nebulizer solution Take 2 mLs (0.5 mg total) by nebulization 2 (two) times daily. 11/26/22   Elgergawy, Silver Huguenin, MD  calcium carbonate (TUMS) 500 MG chewable tablet Chew 1 tablet by mouth daily.    [provider]  Calcium Citrate-Vitamin D ([employer] CALCIUM CITRATE +D3 MINI PO) Take 1 capsule by mouth daily.    [provider]  cyanocobalamin (VITAMIN B12) 1000 MCG tablet Take 1 tablet (1,000 mcg total) by mouth daily. 11/26/22   Elgergawy, Silver Huguenin, MD  diltiazem (CARDIZEM CD) 120 MG 24 hr capsule Take 1 capsule (120 mg total) by mouth daily. 11/27/22   Elgergawy, Silver Huguenin, MD  feeding  supplement (ENSURE ENLIVE / ENSURE PLUS) LIQD Take 237 mLs by mouth 2 (two) times daily between meals. 11/26/22   Elgergawy, Silver Huguenin, MD  Fluocinolone Acetonide 0.01 % OIL Place 5 drops in ear(s) daily. 07/25/22   [provider]  furosemide (LASIX) 20 MG tablet Take 1 tablet (20 mg total) by mouth daily. 11/26/22 11/26/23  Elgergawy, Silver Huguenin, MD  insulin aspart (NOVOLOG) 100 UNIT/ML injection Inject 0-9 Units into the skin 3 (three) times daily with meals. 11/26/22   Elgergawy, Silver Huguenin, MD  insulin degludec (TRESIBA FLEXTOUCH) 100 UNIT/ML FlexTouch Pen Inject 12 Units into the skin at bedtime. 11/26/22   Elgergawy, Silver Huguenin, MD  Insulin Pen Needle (NOVOFINE PLUS PEN NEEDLE) 32G X 4 MM MISC ONE 04/10/22   [provider]  ipratropium-albuterol (DUONEB) 0.5-2.5 (3) MG/3ML SOLN Take 3 mLs by nebulization every 6 (six) hours as needed. 11/26/22   Elgergawy, Silver Huguenin, MD  lactose free nutrition (BOOST) LIQD Take 237 mLs by mouth daily.  [provider]  levothyroxine (SYNTHROID) 88 MCG tablet Take 88 mcg by mouth daily. 07/25/22   [provider]  metoprolol succinate (TOPROL-XL) 25 MG 24 hr tablet Take 1 tablet (25 mg total) by mouth daily. 11/27/22   Elgergawy, Silver Huguenin, MD  Multiple Vitamin (MULTIVITAMIN WITH MINERALS) TABS tablet Take 1 tablet by mouth daily. 11/27/22   Elgergawy, Silver Huguenin, MD  ondansetron (ZOFRAN) 4 MG tablet Take 1 tablet (4 mg total) by mouth every 8 (eight) hours as needed for nausea or vomiting. 11/26/22   Elgergawy, Silver Huguenin, MD  pantoprazole (PROTONIX) 40 MG tablet Take 1 tablet (40 mg total) by mouth daily. Patient not taking: Reported on 11/12/2022 08/26/22   Freddi Starr, MD  rosuvastatin (CRESTOR) 20 MG tablet Take 1 tablet (20 mg total) by mouth daily. 06/20/22 12/17/22  Adrian Prows, MD  Tiotropium Bromide-Olodaterol (STIOLTO RESPIMAT) 2.5-2.5 MCG/ACT AERS Inhale 2 puffs into the lungs daily. 08/26/22   Freddi Starr, MD   traZODone (DESYREL) 50 MG tablet Take 1 tablet (50 mg total) by mouth at bedtime as needed for sleep. 11/26/22   Elgergawy, Silver Huguenin, MD    Physical Exam: Vitals:   12/01/22 1700 12/01/22 1730 12/01/22 1900 12/01/22 1930  BP: 106/69 103/67 116/79 111/71  Pulse: (!) 111 (!) 103 (!) 121 (!) 117  Resp: 18 (!) 26 18 20   Temp:    97.9 F (36.6 C)  TempSrc:    Oral  SpO2: 97% 94% 95% 93%  Weight:      Height:       Physical Exam Vitals and nursing note reviewed.  Constitutional:      General: She is not in acute distress.    Appearance: She is normal weight. She is not toxic-appearing.  HENT:     Head: Normocephalic and atraumatic.     Mouth/Throat:     Mouth: Mucous membranes are moist.     Pharynx: Oropharynx is clear.  Eyes:     Extraocular Movements: Extraocular movements intact.     Conjunctiva/sclera: Conjunctivae normal.     Pupils: Pupils are equal, round, and reactive to light.  Cardiovascular:     Rate and Rhythm: Tachycardia present. Rhythm irregular.  Pulmonary:     Effort: Pulmonary effort is normal.     Breath sounds: Decreased breath sounds (Diminished breath sounds throughout) and rhonchi (Left lower lung field) present. No wheezing or rales.  Abdominal:     General: Bowel sounds are normal. There is no distension.     Palpations: Abdomen is soft.     Tenderness: There is no abdominal tenderness. There is no guarding.  Musculoskeletal:     Right lower leg: No edema.     Left lower leg: No edema.  Skin:    General: Skin is warm and dry.  Neurological:     General: No focal deficit present.     Mental Status: She is alert and oriented to person, place, and time. Mental status is at baseline.  Psychiatric:        Mood and Affect: Mood normal. Affect is tearful.     Data Reviewed: CBC with WBC of 9.1, hemoglobin of 9.1, platelets of 236.  CMP with glucose of 210, BUN of 37, creatinine 1.41, albumin of 3.4 and GFR of 36.  Troponin negative x 2.  BNP  improved from prior at 228.  Lactic acid, magnesium, lipase within normal limits.  COVID-19 PCR, influenza PCR negative.  EKG personally reviewed.  Atrial  fibrillation with rapid ventricular rate at 156.  No acute ST elevation or depression.  DG Chest Portable 1 View  Result Date: 12/01/2022 CLINICAL DATA:  Respiratory distress, hypoxemia. EXAM: PORTABLE CHEST 1 VIEW COMPARISON:  11/23/2022 and CT chest 11/12/2022. FINDINGS: Trachea is midline. Heart is enlarged, stable. Lungs are low in volume with interstitial prominence and indistinctness. Bibasilar collapse/consolidation. Small bilateral pleural effusions. IMPRESSION: 1. Congestive heart failure. 2. Bibasilar collapse/consolidation may be due to atelectasis and/or pneumonia. Electronically Signed   By: Lorin Picket M.D.   On: 12/01/2022 16:03    Results are pending, will review when available.  Assessment and Plan: * Acute hypoxic respiratory failure ([place]) Patient presenting with 1 day history of increased shortness of breath and cough with hypoxia noted as low as 85% on her home oxygen.  She initially required up to 6 L to maintain oxygen saturation and subsequently advanced to heated high flow nasal cannula due to increased work of breathing.  At this time, she is tolerating it well with oxygen saturation ranging around 93-94%. Differential includes hospital-acquired pneumonia given recent hospitalization versus aspiration pneumonia given emesis yesterday.  BNP is improved compared to prior, so less likely CHF related.  Procalcitonin elevated compared to prior at 0.23.  - Continue HHFNC to maintain oxygen saturation above 88% - Continue vancomycin and cefepime - MRSA PCR pending.  If negative, discontinue vancomycin - RVP panel pending - Consider de-escalation once clinical improvement occurs - SLP evaluation with dysphagia diet in the interim - Urinary strep pneumo and Legionella antigens pending  Atrial fibrillation with RVR  ([place]) Patient presenting with atrial fibrillation and RVR likely due to patient's hypoxia and underlying illness with dehydration.  Her heart rate responded well to IV fluid resuscitation however she continues to maintain around 110-120.  - Restart home metoprolol and diltiazem - One-time dose of delta 5 mg ordered - If patient's heart rate increases, will start IV diltiazem infusion  CAD (coronary artery disease) - Continue home apixaban and statin  DM2 (diabetes mellitus, type 2) ([place]) - Hold home regimen - Semglee 6 units prior to bedtime - SSI, sensitive  Stage 3b chronic kidney disease (CKD) ([place]) Renal function stable.  -Trend BMP while admitted  Advance Care Planning:   Code Status: DNR.  Per patient's advanced directive and discussions with palliative  Consults: None  Family Communication: Patient's son updated via telephone  Severity of Illness: The appropriate patient status for this patient is OBSERVATION. Observation status is judged to be reasonable and necessary in order to provide the required intensity of service to ensure the patient's safety. The patient's presenting symptoms, physical exam findings, and initial radiographic and laboratory data in the context of their medical condition is felt to place them at decreased risk for further clinical deterioration. Furthermore, it is anticipated that the patient will be medically stable for discharge from the hospital within 2 midnights of admission.   Author: Jose Persia, MD 12/01/2022 8:24 PM  For on call review [email or web address].

## 2022-12-01 NOTE — Consult Note (Signed)
PHARMACY -  BRIEF ANTIBIOTIC NOTE   Pharmacy has received consult(s) for vancomycin from an ED provider.  The patient's profile has been reviewed for ht/wt/allergies/indication/available labs.    One time order(s) placed for: Vancomycin 1g IV x1 in ED (~20mg /kg per historic weight of 55kg)  Further antibiotics/pharmacy consults should be ordered by admitting physician if indicated.                       Thank you, Lorna Dibble 12/01/2022  3:54 PM

## 2022-12-01 NOTE — ED Notes (Signed)
Writer removed dinner tray from patient's room on assessment, roughly 10% of meal eaten.

## 2022-12-02 ENCOUNTER — Other Ambulatory Visit: Payer: Self-pay

## 2022-12-02 DIAGNOSIS — I130 Hypertensive heart and chronic kidney disease with heart failure and stage 1 through stage 4 chronic kidney disease, or unspecified chronic kidney disease: Secondary | ICD-10-CM | POA: Diagnosis not present

## 2022-12-02 DIAGNOSIS — J9601 Acute respiratory failure with hypoxia: Secondary | ICD-10-CM | POA: Diagnosis not present

## 2022-12-02 DIAGNOSIS — Z515 Encounter for palliative care: Secondary | ICD-10-CM | POA: Diagnosis not present

## 2022-12-02 DIAGNOSIS — J9621 Acute and chronic respiratory failure with hypoxia: Secondary | ICD-10-CM | POA: Diagnosis not present

## 2022-12-02 DIAGNOSIS — Z794 Long term (current) use of insulin: Secondary | ICD-10-CM | POA: Diagnosis not present

## 2022-12-02 DIAGNOSIS — Z743 Need for continuous supervision: Secondary | ICD-10-CM | POA: Diagnosis not present

## 2022-12-02 DIAGNOSIS — R0602 Shortness of breath: Secondary | ICD-10-CM | POA: Diagnosis not present

## 2022-12-02 DIAGNOSIS — J9690 Respiratory failure, unspecified, unspecified whether with hypoxia or hypercapnia: Secondary | ICD-10-CM | POA: Diagnosis not present

## 2022-12-02 DIAGNOSIS — F419 Anxiety disorder, unspecified: Secondary | ICD-10-CM | POA: Diagnosis present

## 2022-12-02 DIAGNOSIS — C7A8 Other malignant neuroendocrine tumors: Secondary | ICD-10-CM | POA: Diagnosis not present

## 2022-12-02 DIAGNOSIS — E1165 Type 2 diabetes mellitus with hyperglycemia: Secondary | ICD-10-CM | POA: Diagnosis present

## 2022-12-02 DIAGNOSIS — I272 Pulmonary hypertension, unspecified: Secondary | ICD-10-CM | POA: Diagnosis present

## 2022-12-02 DIAGNOSIS — R627 Adult failure to thrive: Secondary | ICD-10-CM | POA: Diagnosis present

## 2022-12-02 DIAGNOSIS — I2510 Atherosclerotic heart disease of native coronary artery without angina pectoris: Secondary | ICD-10-CM | POA: Diagnosis not present

## 2022-12-02 DIAGNOSIS — E038 Other specified hypothyroidism: Secondary | ICD-10-CM | POA: Diagnosis not present

## 2022-12-02 DIAGNOSIS — Z9981 Dependence on supplemental oxygen: Secondary | ICD-10-CM | POA: Diagnosis not present

## 2022-12-02 DIAGNOSIS — E1122 Type 2 diabetes mellitus with diabetic chronic kidney disease: Secondary | ICD-10-CM | POA: Diagnosis not present

## 2022-12-02 DIAGNOSIS — R531 Weakness: Secondary | ICD-10-CM | POA: Diagnosis not present

## 2022-12-02 DIAGNOSIS — R1312 Dysphagia, oropharyngeal phase: Secondary | ICD-10-CM | POA: Diagnosis not present

## 2022-12-02 DIAGNOSIS — E860 Dehydration: Secondary | ICD-10-CM | POA: Diagnosis present

## 2022-12-02 DIAGNOSIS — Z8673 Personal history of transient ischemic attack (TIA), and cerebral infarction without residual deficits: Secondary | ICD-10-CM | POA: Diagnosis not present

## 2022-12-02 DIAGNOSIS — I480 Paroxysmal atrial fibrillation: Secondary | ICD-10-CM | POA: Diagnosis not present

## 2022-12-02 DIAGNOSIS — Z66 Do not resuscitate: Secondary | ICD-10-CM | POA: Diagnosis present

## 2022-12-02 DIAGNOSIS — E119 Type 2 diabetes mellitus without complications: Secondary | ICD-10-CM | POA: Diagnosis not present

## 2022-12-02 DIAGNOSIS — D649 Anemia, unspecified: Secondary | ICD-10-CM | POA: Diagnosis present

## 2022-12-02 DIAGNOSIS — E039 Hypothyroidism, unspecified: Secondary | ICD-10-CM | POA: Diagnosis not present

## 2022-12-02 DIAGNOSIS — C3490 Malignant neoplasm of unspecified part of unspecified bronchus or lung: Secondary | ICD-10-CM | POA: Diagnosis not present

## 2022-12-02 DIAGNOSIS — D3A090 Benign carcinoid tumor of the bronchus and lung: Secondary | ICD-10-CM | POA: Diagnosis not present

## 2022-12-02 DIAGNOSIS — R64 Cachexia: Secondary | ICD-10-CM | POA: Diagnosis present

## 2022-12-02 DIAGNOSIS — I5032 Chronic diastolic (congestive) heart failure: Secondary | ICD-10-CM | POA: Diagnosis present

## 2022-12-02 DIAGNOSIS — E871 Hypo-osmolality and hyponatremia: Secondary | ICD-10-CM | POA: Diagnosis not present

## 2022-12-02 DIAGNOSIS — I10 Essential (primary) hypertension: Secondary | ICD-10-CM | POA: Diagnosis not present

## 2022-12-02 DIAGNOSIS — Z20822 Contact with and (suspected) exposure to covid-19: Secondary | ICD-10-CM | POA: Diagnosis present

## 2022-12-02 DIAGNOSIS — I5033 Acute on chronic diastolic (congestive) heart failure: Secondary | ICD-10-CM | POA: Diagnosis not present

## 2022-12-02 DIAGNOSIS — C3491 Malignant neoplasm of unspecified part of right bronchus or lung: Secondary | ICD-10-CM | POA: Diagnosis present

## 2022-12-02 DIAGNOSIS — K219 Gastro-esophageal reflux disease without esophagitis: Secondary | ICD-10-CM | POA: Diagnosis not present

## 2022-12-02 DIAGNOSIS — E440 Moderate protein-calorie malnutrition: Secondary | ICD-10-CM | POA: Diagnosis present

## 2022-12-02 DIAGNOSIS — J90 Pleural effusion, not elsewhere classified: Secondary | ICD-10-CM | POA: Diagnosis not present

## 2022-12-02 DIAGNOSIS — D631 Anemia in chronic kidney disease: Secondary | ICD-10-CM | POA: Diagnosis not present

## 2022-12-02 DIAGNOSIS — G4700 Insomnia, unspecified: Secondary | ICD-10-CM | POA: Diagnosis not present

## 2022-12-02 DIAGNOSIS — I5041 Acute combined systolic (congestive) and diastolic (congestive) heart failure: Secondary | ICD-10-CM | POA: Diagnosis not present

## 2022-12-02 DIAGNOSIS — J189 Pneumonia, unspecified organism: Secondary | ICD-10-CM | POA: Diagnosis present

## 2022-12-02 DIAGNOSIS — I482 Chronic atrial fibrillation, unspecified: Secondary | ICD-10-CM | POA: Diagnosis not present

## 2022-12-02 DIAGNOSIS — E538 Deficiency of other specified B group vitamins: Secondary | ICD-10-CM | POA: Diagnosis not present

## 2022-12-02 DIAGNOSIS — C3492 Malignant neoplasm of unspecified part of left bronchus or lung: Secondary | ICD-10-CM | POA: Diagnosis present

## 2022-12-02 DIAGNOSIS — Z7901 Long term (current) use of anticoagulants: Secondary | ICD-10-CM | POA: Diagnosis not present

## 2022-12-02 DIAGNOSIS — N1832 Chronic kidney disease, stage 3b: Secondary | ICD-10-CM | POA: Diagnosis present

## 2022-12-02 DIAGNOSIS — Z7401 Bed confinement status: Secondary | ICD-10-CM | POA: Diagnosis not present

## 2022-12-02 DIAGNOSIS — I4821 Permanent atrial fibrillation: Secondary | ICD-10-CM | POA: Diagnosis not present

## 2022-12-02 DIAGNOSIS — Y95 Nosocomial condition: Secondary | ICD-10-CM | POA: Diagnosis not present

## 2022-12-02 DIAGNOSIS — E43 Unspecified severe protein-calorie malnutrition: Secondary | ICD-10-CM | POA: Diagnosis not present

## 2022-12-02 LAB — CBC WITH DIFFERENTIAL/PLATELET
Abs Immature Granulocytes: 0.05 10*3/uL (ref 0.00–0.07)
Basophils Absolute: 0 10*3/uL (ref 0.0–0.1)
Basophils Relative: 1 %
Eosinophils Absolute: 0 10*3/uL (ref 0.0–0.5)
Eosinophils Relative: 0 %
HCT: 24.9 % — ABNORMAL LOW (ref 36.0–46.0)
Hemoglobin: 7.9 g/dL — ABNORMAL LOW (ref 12.0–15.0)
Immature Granulocytes: 1 %
Lymphocytes Relative: 15 %
Lymphs Abs: 1 10*3/uL (ref 0.7–4.0)
MCH: 28.2 pg (ref 26.0–34.0)
MCHC: 31.7 g/dL (ref 30.0–36.0)
MCV: 88.9 fL (ref 80.0–100.0)
Monocytes Absolute: 1.2 10*3/uL — ABNORMAL HIGH (ref 0.1–1.0)
Monocytes Relative: 18 %
Neutro Abs: 4.3 10*3/uL (ref 1.7–7.7)
Neutrophils Relative %: 65 %
Platelets: 187 10*3/uL (ref 150–400)
RBC: 2.8 MIL/uL — ABNORMAL LOW (ref 3.87–5.11)
RDW: 14.5 % (ref 11.5–15.5)
WBC: 6.6 10*3/uL (ref 4.0–10.5)
nRBC: 0 % (ref 0.0–0.2)

## 2022-12-02 LAB — BASIC METABOLIC PANEL
Anion gap: 6 (ref 5–15)
BUN: 30 mg/dL — ABNORMAL HIGH (ref 8–23)
CO2: 27 mmol/L (ref 22–32)
Calcium: 8.6 mg/dL — ABNORMAL LOW (ref 8.9–10.3)
Chloride: 102 mmol/L (ref 98–111)
Creatinine, Ser: 1.33 mg/dL — ABNORMAL HIGH (ref 0.44–1.00)
GFR, Estimated: 39 mL/min — ABNORMAL LOW (ref >60)
Glucose, Bld: 158 mg/dL — ABNORMAL HIGH (ref 70–99)
Potassium: 4.3 mmol/L (ref 3.5–5.1)
Sodium: 135 mmol/L (ref 135–145)

## 2022-12-02 LAB — CBG MONITORING, ED
Glucose-Capillary: 216 mg/dL — ABNORMAL HIGH (ref 70–99)
Glucose-Capillary: 154 mg/dL — ABNORMAL HIGH (ref 70–99)
Glucose-Capillary: 143 mg/dL — ABNORMAL HIGH (ref 70–99)
Glucose-Capillary: 251 mg/dL — ABNORMAL HIGH (ref 70–99)

## 2022-12-02 LAB — RESPIRATORY PANEL BY PCR

## 2022-12-02 LAB — MRSA NEXT GEN BY PCR, NASAL: MRSA by PCR Next Gen: NOT DETECTED

## 2022-12-02 LAB — STREP PNEUMONIAE URINARY ANTIGEN: Strep Pneumo Urinary Antigen: NEGATIVE

## 2022-12-02 MED ORDER — FUROSEMIDE 10 MG/ML IJ SOLN
40.0000 mg | Freq: Once | INTRAMUSCULAR | Status: AC
  Administered 2022-12-02: 40 mg via INTRAVENOUS
  Filled 2022-12-02: qty 4

## 2022-12-02 MED ORDER — FUROSEMIDE 10 MG/ML IJ SOLN
40.0000 mg | Freq: Two times a day (BID) | INTRAMUSCULAR | Status: DC
  Administered 2022-12-03 – 2022-12-09 (×13): 40 mg via INTRAVENOUS
  Filled 2022-12-02 (×13): qty 4

## 2022-12-02 MED ORDER — LEVALBUTEROL HCL 0.63 MG/3ML IN NEBU
0.6300 mg | INHALATION_SOLUTION | RESPIRATORY_TRACT | Status: DC
  Administered 2022-12-02 – 2022-12-03 (×5): 0.63 mg via RESPIRATORY_TRACT
  Filled 2022-12-02 (×5): qty 3

## 2022-12-02 MED ORDER — IPRATROPIUM-ALBUTEROL 0.5-2.5 (3) MG/3ML IN SOLN: 3.0000 mL | Freq: Four times a day (QID) | RESPIRATORY_TRACT | Status: DC | PRN

## 2022-12-02 MED ORDER — METHYLPREDNISOLONE SODIUM SUCC 125 MG IJ SOLR
60.0000 mg | Freq: Two times a day (BID) | INTRAMUSCULAR | Status: DC
  Administered 2022-12-02 – 2022-12-03 (×2): 60 mg via INTRAVENOUS
  Filled 2022-12-02 (×2): qty 2

## 2022-12-02 NOTE — Progress Notes (Addendum)
[facility] NAME: Shameka Aggarwal    MR#:  [id number]  DATE OF BIRTH:  [birth year]  SUBJECTIVE:  patient came in from [facility] with increasing shortness of breath and cough. She is currently on-nasal cannula oxygen. Feels a bit better. My evaluation no family at bedside. It was just recently discharged from [facility]: on November 29 with respiratory failure, CHF and infection. Patient denies any chest pain.     VITALS:  Blood pressure 127/82, pulse (!) 109, temperature 98.4 F (36.9 C), temperature source Oral, resp. rate (!) 26, height 5\' 2"  (1.575 m), weight 54.4 kg, SpO2 93 %.  PHYSICAL EXAMINATION:   GENERAL:  88 y.o.-year-old patient lying in the bed with no acute distress. 10 frail critically ill on high flow nasal cannula oxygen LUNGS decreased breath sounds bilaterally, no wheezing CARDIOVASCULAR: S1, S2 normal. No murmur   ABDOMEN: Soft, nontender, nondistended. Bowel sounds present.  EXTREMITIES: No  edema b/l.    NEUROLOGIC: nonfocal  patient is alert and awake,  deconditioned SKIN: No obvious rash, lesion, or ulcer.   LABORATORY PANEL:  CBC Recent Labs  Lab 12/02/22 0433  WBC 6.6  HGB 7.9*  HCT 24.9*  PLT 187    Chemistries  Recent Labs  Lab 12/01/22 1503 12/02/22 0433  NA 135 135  K 4.5 4.3  CL 98 102  CO2 30 27  GLUCOSE 210* 158*  BUN 37* 30*  CREATININE 1.41* 1.33*  CALCIUM 9.0 8.6*  MG 2.1  --   AST 22  --   ALT 20  --   ALKPHOS 84  --   BILITOT 0.8  --    Cardiac Enzymes No results for input(s): "TROPONINI" in the last 168 hours. RADIOLOGY:  DG Chest Portable 1 View  Result Date: 12/01/2022 CLINICAL DATA:  Respiratory distress, hypoxemia. EXAM: PORTABLE CHEST 1 VIEW COMPARISON:  11/23/2022 and CT chest 11/12/2022. FINDINGS: Trachea is midline. Heart is enlarged, stable. Lungs are low in volume with interstitial prominence and indistinctness. Bibasilar collapse/consolidation. Small  bilateral pleural effusions. IMPRESSION: 1. Congestive heart failure. 2. Bibasilar collapse/consolidation may be due to atelectasis and/or pneumonia. Electronically Signed   By: Lorin Picket M.D.   On: 12/01/2022 16:03    Assessment and Plan  Deklynn SHONTEL SANTEE is a 88 y.o. female with medical history significant of CAD, pulmonary hypertension, permanent atrial fibrillation, type 2 diabetes, hypertension, grade 4 low-grade lung carcinoid/neuroendocrine tumor, who presents to the ED with complaints of increased shortness of breath.   Mrs. Awwad states that she has been experiencing progressively worsening shortness of breath and dyspnea on exertion for the last couple months.  Since she has been discharged to SNF, her breathing has not significantly improved. Chest x-ray with bibasilar collapse/consolidation due to atelectasis versus pneumonia   Acute on chronic chronic hypoxic respiratory failure -- patient was recently admitted [facility] discharge on November 29 with similar symptoms to [facility], -- continue high flow nasal cannula oxygen secondary to work of breathing. Sats arranged around 93-94% -- patient received vacnomycin and cefepime -- she overall has poor lung condition. Likely aspirating slowly. Continue cefepime -- MRSA PCR negative --speech to evaluate swallowing -- respiratory panel negative. -- Blood culture negative  Atrial fibrillation with RVR -- in the setting of hypoxia and underlying illness -- resume beta-blockers and diltiazem -- on eliquis  CAD -- continue statin  Type II diabetes -- sliding scale insulin  CKD 3B --renal function stable  Overall poor long term prognosis. Spoke with son at [facility] to see pt Procedures: Family communication : spoke with son steven on phone Consults : CODE STATUS: DNR/DNI DVT Prophylaxis : eliquis Level of care: Telemetry Cardiac Status is: Inpatient Remains inpatient appropriate because: respiratory  failure    TOTAL TIME TAKING CARE OF THIS PATIENT: 35 minutes.  >50% time spent on counselling and coordination of care  Note: This dictation was prepared with Dragon dictation along with smaller phrase technology. Any transcriptional errors that result from this process are unintentional.  Fritzi Mandes M.D    [facility]   CC: Primary care physician; [name], [place]

## 2022-12-02 NOTE — ED Notes (Signed)
Meds given.  Pt alert.  High flow oxygen in place. Sinus tach on monitor.

## 2022-12-02 NOTE — ED Notes (Signed)
Dr patel in with pt.  Meds given.

## 2022-12-02 NOTE — ED Notes (Signed)
Fsbs 251

## 2022-12-02 NOTE — ED Notes (Signed)
PT's brief was wet with urine. Writer and NT changed brief and transferred patient to hospital bed.

## 2022-12-02 NOTE — Progress Notes (Signed)
Pt transported from ER to room 238 on NRB mask with a sat of 96% and placed back on Optiflow once in room.

## 2022-12-02 NOTE — Progress Notes (Signed)
Pt taken of [place] and placed on 6l  to see if she could be transported on that and pt's sats dropped to low 80's, pt placed back on HHFNC at this time

## 2022-12-02 NOTE — ED Notes (Signed)
Reumed care from natalie rn.  Pt awake.  Humidify oxygen in place.  Afib on monitor.  Pure wick in place.  Pt waiting on bed assignment.

## 2022-12-02 NOTE — ED Notes (Signed)
Pt boosted herself in bed

## 2022-12-02 NOTE — ED Notes (Signed)
Pt repositioned in bed.

## 2022-12-03 DIAGNOSIS — J189 Pneumonia, unspecified organism: Secondary | ICD-10-CM

## 2022-12-03 DIAGNOSIS — Z515 Encounter for palliative care: Secondary | ICD-10-CM | POA: Diagnosis not present

## 2022-12-03 DIAGNOSIS — C3490 Malignant neoplasm of unspecified part of unspecified bronchus or lung: Secondary | ICD-10-CM

## 2022-12-03 DIAGNOSIS — R0602 Shortness of breath: Secondary | ICD-10-CM

## 2022-12-03 DIAGNOSIS — N1832 Chronic kidney disease, stage 3b: Secondary | ICD-10-CM | POA: Diagnosis not present

## 2022-12-03 DIAGNOSIS — Y95 Nosocomial condition: Secondary | ICD-10-CM

## 2022-12-03 DIAGNOSIS — F419 Anxiety disorder, unspecified: Secondary | ICD-10-CM | POA: Diagnosis not present

## 2022-12-03 DIAGNOSIS — I480 Paroxysmal atrial fibrillation: Secondary | ICD-10-CM | POA: Diagnosis not present

## 2022-12-03 DIAGNOSIS — J9621 Acute and chronic respiratory failure with hypoxia: Principal | ICD-10-CM

## 2022-12-03 DIAGNOSIS — J9601 Acute respiratory failure with hypoxia: Principal | ICD-10-CM

## 2022-12-03 LAB — URINE CULTURE

## 2022-12-03 LAB — GLUCOSE, CAPILLARY
Glucose-Capillary: 289 mg/dL — ABNORMAL HIGH (ref 70–99)
Glucose-Capillary: 468 mg/dL — ABNORMAL HIGH (ref 70–99)
Glucose-Capillary: 389 mg/dL — ABNORMAL HIGH (ref 70–99)

## 2022-12-03 LAB — LEGIONELLA PNEUMOPHILA SEROGP 1 UR AG: L. pneumophila Serogp 1 Ur Ag: NEGATIVE

## 2022-12-03 MED ORDER — LEVALBUTEROL HCL 0.63 MG/3ML IN NEBU
0.6300 mg | INHALATION_SOLUTION | Freq: Four times a day (QID) | RESPIRATORY_TRACT | Status: DC
  Administered 2022-12-03 – 2022-12-05 (×8): 0.63 mg via RESPIRATORY_TRACT
  Filled 2022-12-03 (×8): qty 3

## 2022-12-03 MED ORDER — INSULIN GLARGINE-YFGN 100 UNIT/ML ~~LOC~~ SOLN
6.0000 [IU] | Freq: Once | SUBCUTANEOUS | Status: AC
  Administered 2022-12-03: 6 [IU] via SUBCUTANEOUS
  Filled 2022-12-03 (×2): qty 0.06

## 2022-12-03 MED ORDER — IPRATROPIUM BROMIDE 0.02 % IN SOLN
0.5000 mg | Freq: Four times a day (QID) | RESPIRATORY_TRACT | Status: DC
  Administered 2022-12-03 – 2022-12-08 (×21): 0.5 mg via RESPIRATORY_TRACT
  Filled 2022-12-03 (×21): qty 2.5

## 2022-12-03 MED ORDER — METOPROLOL TARTRATE 25 MG PO TABS
25.0000 mg | ORAL_TABLET | Freq: Two times a day (BID) | ORAL | Status: DC
  Administered 2022-12-03 – 2022-12-07 (×7): 25 mg via ORAL
  Filled 2022-12-03 (×8): qty 1

## 2022-12-03 MED ORDER — METHYLPREDNISOLONE SODIUM SUCC 40 MG IJ SOLR
40.0000 mg | Freq: Two times a day (BID) | INTRAMUSCULAR | Status: AC
  Administered 2022-12-03 – 2022-12-04 (×2): 40 mg via INTRAVENOUS
  Filled 2022-12-03 (×2): qty 1

## 2022-12-03 MED ORDER — ADULT MULTIVITAMIN W/MINERALS CH
1.0000 | ORAL_TABLET | Freq: Every day | ORAL | Status: DC
  Administered 2022-12-03 – 2022-12-09 (×7): 1 via ORAL
  Filled 2022-12-03 (×7): qty 1

## 2022-12-03 MED ORDER — INSULIN GLARGINE-YFGN 100 UNIT/ML ~~LOC~~ SOLN
10.0000 [IU] | Freq: Every day | SUBCUTANEOUS | Status: DC
  Filled 2022-12-03: qty 0.1

## 2022-12-03 MED ORDER — ENSURE ENLIVE PO LIQD
237.0000 mL | Freq: Three times a day (TID) | ORAL | Status: DC
  Administered 2022-12-03 – 2022-12-09 (×14): 237 mL via ORAL

## 2022-12-03 NOTE — Progress Notes (Signed)
Initial Nutrition Assessment  DOCUMENTATION CODES:   Not applicable  INTERVENTION:   -Ensure Enlive po TID, each supplement provides 350 kcal and 20 grams of protein.  -MVI with minerals daily -Magic cup TID with meals, each supplement provides 290 kcal and 9 grams of protein   NUTRITION DIAGNOSIS:   Inadequate oral intake related to poor appetite as evidenced by meal completion < 25%.  GOAL:   Patient will meet greater than or equal to 90% of their needs  MONITOR:   PO intake, Supplement acceptance  REASON FOR ASSESSMENT:   Malnutrition Screening Tool    ASSESSMENT:   Pt with a PMH significant for  CAD, pulmonary hypertension, permanent atrial fibrillation, type 2 diabetes, hypertension, grade 4 low-grade lung carcinoid/neuroendocrine tumor.  Pt admitted with respiratory failure and a-fib with RVR.   Reviewed I/O's: -1.3 L x 24 hours and -817 ml since admission  UOP: 1.8 L x 24 hours   Pt unavailable at time of visit. RD unable to obtain further nutrition-related history or complete nutrition-focused physical exam at this time.    Pt with history of severe malnutrition, which RD suspects in ongoing. RD will order supplements ot help optimize nutritional status.   Per MD notes, pt is a DNI. Prognosis is poor; pt awaiting palliative care consult for goals of care.   Reviewed wt hx; no wt loss noted over the past 6 months.   Medications reviewed and include lasix, solu-medrol, and cardizem.   Lab Results  Component Value Date   HGBA1C 5.8 (H) 11/12/2022   PTA DM medications are 0-9 units insulin aspart TID with meals and 12 units insulin degludec daily.   Labs reviewed: CBGS: [id number] (inpatient orders for glycemic control are 0-9 units insulin aspart TID with meals and 10 units insulin glargine-yfgn daily).    Diet Order:   Diet Order             DIET DYS 3 Room service appropriate? Yes; Fluid consistency: Thin  Diet effective now                    EDUCATION NEEDS:   No education needs have been identified at this time  Skin:  Skin Assessment: Reviewed RN Assessment  Last BM:  Unknown  Height:   Ht Readings from Last 1 Encounters:  12/01/22 5\' 2"  (1.575 m)    Weight:   Wt Readings from Last 1 Encounters:  12/03/22 56.4 kg    Ideal Body Weight:  50 kg  BMI:  Body mass index is 22.74 kg/m.  Estimated Nutritional Needs:   Kcal:  1700-1900  Protein:  85-100 grams  Fluid:  > 1.7 L    Loistine Chance, RD, LDN, [place] Registered Dietitian II Certified Diabetes Care and Education Specialist Please refer to [facility] for RD and/or RD on-call/weekend/after hours pager

## 2022-12-03 NOTE — Progress Notes (Signed)
PROGRESS NOTE  [name]    DOB: 02/12/1934, 88 y.o.  PFX:[id number]    Code Status: DNR   DOA: 12/01/2022   LOS: 1   Brief hospital course  [name] is a 88 y.o. female with a PMH significant for  CAD, pulmonary hypertension, permanent atrial fibrillation, type 2 diabetes, hypertension, grade 4 low-grade lung carcinoid/neuroendocrine tumor.  They presented from [facility] to the ED on 12/01/2022 with SOB for several months but acutely worsened x 2 days. She had episodes of nausea and vomiting after eating day prior to arrival.  She was recently discharged to the SNF on 2 to 3 L of oxygen following an admission for acute hypoxic respiratory failure secondary to acute on chronic HFpEF and stage IV low-grade lung carcinoid tumor.  In the ED, it was found that they had tachycardia to 151, blood pressure 127/72, hypoxic at her discharge oxygen level to 85% O2 which improved to normal levels with increasing oxygen up to 6 L.  She had significant increased work of breathing and was started on heated high flow nasal cannula.  Significant findings included normal pCO2.BNP improved compared to prior at 228.  White blood count within normal limits.  Influenza and COVID-19 PCR negative.  Chest x-ray with bibasilar collapse/consolidation due to atelectasis versus pneumonia.  They were initially treated with vancomycin and cefepime for concern of HCAP .   Patient was admitted to medicine service for further workup and management of acute on chronic hypoxic respiratory failure as outlined in detail below.  12/03/22 -stable, frail  Assessment & Plan  Principal Problem:   Acute hypoxic respiratory failure (HCC) Active Problems:   Atrial fibrillation with RVR (HCC)   CAD (coronary artery disease)   DM2 (diabetes mellitus, type 2) (HCC)   Stage 3b chronic kidney disease (CKD) (HCC)   Acute on chronic respiratory failure with hypoxemia (HCC)  Acute on chronic chronic hypoxic respiratory failure-  remains on HHFNC at 45L FiO2 57%. Respiratory panel negative.Blood culture negative She clinically appeared to be frail and anxious with increased work of breathing. Declines anxiolytics. Overall poor condition and based on my assessment today, she is not able to be weaned down on her oxygen dependence.  She is DNR so would not intubate if she worsens.  I spoke with the patient about a palliative consult given her poor prognosis and she is open to this discussion.   -- continue high flow nasal cannula oxygen secondary to work of breathing. Sats arranged around 93-94% -- patient received vacnomycin and cefepime -Palliative consulted, appreciate recs -Continue breathing treatments -Continue steroids   Atrial fibrillation with RVR CAD -- in the setting of hypoxia and underlying illness -- resume beta-blockers and diltiazem -- on eliquis - consider stopping statin given prognosis <10 years   Type II diabetes- poorly controlled given steroid use -- sliding scale insulin -Increase long-acting dose to cover for hyperglycemia with steroids   CKD 3B --renal function stable, continue to monitor  Body mass index is 22.74 kg/m.  VTE ppx: apixaban (ELIQUIS) tablet 2.5 mg Start: 12/01/22 2200 apixaban (ELIQUIS) tablet 2.5 mg   Diet:     Diet   DIET DYS 3 Room service appropriate? Yes; Fluid consistency: Thin   Consultants: Palliative  Subjective 12/03/22    Pt reports feeling tired and short of breath.  She expresses anxiety over being left alone because she feels like her breathing could stop at any moment and she would die.  I offered her reassurance  with her continuous monitoring.  Agrees to speak with palliative medicine concerning her severe condition and poor prognosis.   Objective   Vitals:   12/03/22 0646 12/03/22 0758 12/03/22 0814 12/03/22 1230  BP: 105/68  103/73 117/66  Pulse: 91 (!) 101 (!) 107 (!) 136  Resp:  18 20 20   Temp:   97.9 F (36.6 C) 97.9 F (36.6 C)   TempSrc:      SpO2:  93% 91% 93%  Weight:      Height:        Intake/Output Summary (Last 24 hours) at 12/03/2022 1240 Last data filed at 12/03/2022 0955 Gross per 24 hour  Intake 843 ml  Output 1800 ml  Net -957 ml   Filed Weights   12/01/22 1648 12/03/22 0455  Weight: 54.4 kg 56.4 kg     Physical Exam:  General: awake, alert, frail, anxious HEENT: atraumatic, clear conjunctiva, anicteric sclera, MMM, hearing grossly normal Respiratory: Increased work of breathing, decreased lung sounds throughout Cardiovascular: Delayed capillary refill, regular rhythm, Nervous: A&O x3. no gross focal neurologic deficits, normal speech Extremities: moves all equally, no edema, normal tone Skin: dry, intact, normal temperature, normal color. No rashes, lesions or ulcers on exposed skin Psychiatry: Anxious mood, congruent affect  Labs   I have personally reviewed the following labs and imaging studies CBC    Component Value Date/Time   WBC 6.6 12/02/2022 0433   RBC 2.80 (L) 12/02/2022 0433   HGB 7.9 (L) 12/02/2022 0433   HGB 11.7 09/17/2021 1316   HCT 24.9 (L) 12/02/2022 0433   HCT 36.7 09/17/2021 1316   PLT 187 12/02/2022 0433   PLT 128 (L) 09/17/2021 1316   MCV 88.9 12/02/2022 0433   MCV 86 09/17/2021 1316   MCH 28.2 12/02/2022 0433   MCHC 31.7 12/02/2022 0433   RDW 14.5 12/02/2022 0433   RDW 16.0 (H) 09/17/2021 1316   LYMPHSABS 1.0 12/02/2022 0433   MONOABS 1.2 (H) 12/02/2022 0433   EOSABS 0.0 12/02/2022 0433   BASOSABS 0.0 12/02/2022 0433      Latest Ref Rng & Units 12/02/2022    4:33 AM 12/01/2022    3:03 PM 11/26/2022    8:17 AM  BMP  Glucose 70 - 99 mg/dL 158  210  136   BUN 8 - 23 mg/dL 30  37  45   Creatinine 0.44 - 1.00 mg/dL 1.33  1.41  1.61   Sodium 135 - 145 mmol/L 135  135  133   Potassium 3.5 - 5.1 mmol/L 4.3  4.5  4.1   Chloride 98 - 111 mmol/L 102  98  96   CO2 22 - 32 mmol/L 27  30  28    Calcium 8.9 - 10.3 mg/dL 8.6  9.0  8.7     DG Chest Portable 1  View  Result Date: 12/01/2022 CLINICAL DATA:  Respiratory distress, hypoxemia. EXAM: PORTABLE CHEST 1 VIEW COMPARISON:  11/23/2022 and CT chest 11/12/2022. FINDINGS: Trachea is midline. Heart is enlarged, stable. Lungs are low in volume with interstitial prominence and indistinctness. Bibasilar collapse/consolidation. Small bilateral pleural effusions. IMPRESSION: 1. Congestive heart failure. 2. Bibasilar collapse/consolidation may be due to atelectasis and/or pneumonia. Electronically Signed   By: Lorin Picket M.D.   On: 12/01/2022 16:03    Disposition Plan & Communication  Patient status: Inpatient  Admitted From: SNF Planned disposition location: Skilled nursing facility Anticipated discharge date: 12/10 pending clinical improvement  Family Communication: None   Author: Richarda Osmond, DO  [facility] 12/03/2022, 12:40 PM   Available by Epic secure chat 7AM-7PM. If 7PM-7AM, please contact night-coverage.  [facility] contact information found on [email or web address].

## 2022-12-03 NOTE — Consult Note (Signed)
Consultation Note Date: 12/03/2022   Patient Name: [name]  DOB: 1934-04-05  MRN: [id number]  Age / Sex: 88 y.o., female  PCP: [name], [place] Referring Physician: Richarda Osmond, MD  Reason for Consultation: Establishing goals of care and Psychosocial/spiritual support  HPI/Patient Profile: 88 y.o. female   admitted on 12/01/2022 with past medical history significant of CAD, pulmonary hypertension, permanent atrial fibrillation, type 2 diabetes, hypertension, grade 4 low-grade lung carcinoid/neuroendocrine tumor, who presents to the ED with complaints of increased shortness of breath.  Recently admitted from 11/14 to 11/29 for acute hypoxic respiratory failure felt to be secondary to acute on chronic HFpEF and stage IV low-grade lung carcinoid tumor.  She was discharged on supplemental oxygen, 2-3 L to maintain oxygen saturation above 88%.   Reported  worsening shortness of breath and dyspnea on exertion for the last couple months.  Since she has been discharged to SNF, her breathing has not significantly improved.  Yesterday, she had an episode of nausea and vomiting after eating and states this happens every time she eats.  She notes that she has a history of GERD and her stomach is extremely sensitive.  Her emesis since yesterday was nonbloody.  Today, she noted increased shortness of breath and cough.  She denies any fever, chills, chest pain, palpitations.  She denies any additional episodes of emesis today.   Of note, patient was recently admitted from 11/14 to 11/29 for acute hypoxic respiratory failure felt to be secondary to acute on chronic HFpEF and stage IV low-grade lung carcinoid tumor.  She was discharged on supplemental oxygen, 2-3 L to maintain oxygen saturation above 88%.   ED course: On arrival to the ED, patient was tachycardic up to 151 with blood pressure of 127/72.  She  was hypoxic at 85 and subsequently required increased up to 6 L.  Due to increased work of breathing, patient subsequently required heated high flow nasal cannula.  Initial workup workup remarkable for normal pCO2.BNP improved compared to prior at 228.  White blood count within normal limits.  Influenza and COVID-19 PCR negative.  Chest x-ray with bibasilar collapse/consolidation due to atelectasis versus pneumonia.  Patient started on vancomycin and cefepime for concern of HCAP.  TRH consulted for admission.  Patient and family face treatment option decisions,Advanced directive decisions and anticipatory care needs.   Clinical Assessment and Goals of Care:  This NP [name] reviewed medical records, received report from team, assessed the patient and then meet at the patient's bedside  to discuss diagnosis, prognosis, GOC, EOL wishes disposition and options.    Of note, This nurse practitioner had multiple meetings with patient and family on admission back in November at [facility].    Concept of Palliative Care was re-introduced as specialized medical care for people and their families living with serious illness.  If focuses on providing relief from the symptoms and stress of a serious illness.  The goal is to improve quality of life for both the patient and the  family.Values and goals of care important to patient and family were attempted to be elicited.  I spoke to patient's son/H POA: [name] by telephone  Created space and opportunity for patient  and family to explore thoughts and feelings regarding current medical situation.  Patient continues to hope for improvement and the ability to return to her home independently.  However she speaks to the difficulty she had attempting rehabilitation stating "they wanted me to do things that I just cannot do".  I again talked with [name] about failure to thrive and the body's inability to rebound secondary to multiple co-morbidities at some point  in time and her human mortality.  [name] verbalizes an understanding and disappointment in this most recent attempt at rehabilitation.  We just want our mother to get better.     A  discussion was had today regarding advanced directives.  Concepts specific to code status, artifical feeding and hydration, continued IV antibiotics and rehospitalization was had.    The difference between a aggressive medical intervention path  and a palliative comfort care path for this patient at this time was had.     Raised awareness to patient's documented declaration for a natural death      Questions and concerns addressed.  Patient  encouraged to call with questions or concerns.     PMT will continue to support holistically.     SUMMARY OF RECOMMENDATIONS    Code Status/Advance Care Planning: DNR  Palliative Prophylaxis:  Aspiration, Bowel Regimen, Delirium Protocol, Frequent Pain Assessment, and Oral Care  Additional Recommendations (Limitations, Scope, Preferences): Full Scope Treatment H POA/son/Deanna plans to visit his mother in the hospital tomorrow.  It will be important for continued ongoing goals of care discussion with patient and family making decisions dependent on medical outcomes  Psycho-social/Spiritual:  Desire for further Chaplaincy support:no Additional Recommendations: Education on Hospice  Prognosis:  Unable to determine  Discharge Planning: To Be Determined      Primary Diagnoses: Present on Admission:  Acute hypoxic respiratory failure (HCC)  Atrial fibrillation with RVR (HCC)  CAD (coronary artery disease)  Acute on chronic respiratory failure with hypoxemia ([place])   I have reviewed the medical record, interviewed the patient and family, and examined the patient. The following aspects are pertinent.  Past Medical History:  Diagnosis Date   Acute diverticulitis 06/2017   Anemia 06/2017   CAD (coronary artery disease) 03/13/2012   Diabetes mellitus     Dysrhythmia    ATRIAL FIB   Fall 07/07/2017   HTN (hypertension) 03/13/2012   Hypothyroid 03/13/2012   Lung cancer (HCC)    Shortness of breath    Stroke ([place])    Syncope and collapse 04/02/2016   Type II or unspecified type diabetes mellitus without mention of complication, not stated as uncontrolled 03/13/2012   Social History   Socioeconomic History   Marital status: Widowed    Spouse name: Not on file   Number of children: 3   Years of education: Not on file   Highest education level: Not on file  Occupational History   Not on file  Tobacco Use   Smoking status: Never   Smokeless tobacco: Never  Vaping Use   Vaping Use: Never used  Substance and Sexual Activity   Alcohol use: No   Drug use: No   Sexual activity: Yes    Birth control/protection: None  Other Topics Concern   Not on file  Social History Narrative   Not on file  Social Determinants of Health   Financial Resource Strain: Not on file  Food Insecurity: Not on file  Transportation Needs: No Transportation Needs (12/02/2022)   PRAPARE - Hydrologist (Medical): No    Lack of Transportation (Non-Medical): No  Physical Activity: Not on file  Stress: Not on file  Social Connections: Not on file   Family History  Problem Relation Age of Onset   Breast cancer Mother    Aneurysm Father        Likely thoracic aneurysm per patient   Hypertension Sister    Heart disease Sister    Diabetes type II Sister    Diabetes type II Sister    Diverticulitis Sister    Diabetes type II Sister    Stroke Sister    Heart disease Brother    Melanoma Brother    Melanoma Brother    Scheduled Meds:  apixaban  2.5 mg Oral BID   arformoterol  15 mcg Nebulization BID   And   umeclidinium bromide  1 puff Inhalation Daily   budesonide  0.5 mg Nebulization BID   diltiazem  120 mg Oral Daily   feeding supplement  237 mL Oral BID BM   furosemide  40 mg Intravenous Q12H   insulin aspart  0-9  Units Subcutaneous TID WC   insulin glargine-yfgn  6 Units Subcutaneous QHS   levalbuterol  0.63 mg Nebulization Q4H   levothyroxine  88 mcg Oral Q0600   methylPREDNISolone (SOLU-MEDROL) injection  60 mg Intravenous Q12H   metoprolol succinate  25 mg Oral Daily   rosuvastatin  20 mg Oral Daily   sodium chloride flush  3 mL Intravenous Q12H   Continuous Infusions:  ceFEPime (MAXIPIME) IV Stopped (12/02/22 1723)   PRN Meds:.acetaminophen **OR** acetaminophen, ipratropium-albuterol, ondansetron **OR** ondansetron (ZOFRAN) IV, polyethylene glycol Medications Prior to Admission:  Prior to Admission medications   Medication Sig Start Date End Date Taking? Authorizing Provider  acetaminophen (TYLENOL) 325 MG tablet Take 2 tablets (650 mg total) by mouth every 6 (six) hours as needed for mild pain, fever or headache. 11/26/22  Yes Elgergawy, Silver Huguenin, MD  apixaban (ELIQUIS) 2.5 MG TABS tablet Take 1 tablet (2.5 mg total) by mouth 2 (two) times daily. 11/26/22  Yes Elgergawy, Silver Huguenin, MD  budesonide (PULMICORT) 0.5 MG/2ML nebulizer solution Take 2 mLs (0.5 mg total) by nebulization 2 (two) times daily. 11/26/22  Yes Elgergawy, Silver Huguenin, MD  calcium carbonate (TUMS) 500 MG chewable tablet Chew 1 tablet by mouth daily.   Yes [provider]  Calcium Citrate-Vitamin D ([employer] CALCIUM CITRATE +D3 MINI PO) Take 1 capsule by mouth daily.   Yes [provider]  cyanocobalamin (VITAMIN B12) 1000 MCG tablet Take 1 tablet (1,000 mcg total) by mouth daily. 11/26/22  Yes Elgergawy, Silver Huguenin, MD  diltiazem (CARDIZEM CD) 120 MG 24 hr capsule Take 1 capsule (120 mg total) by mouth daily. 11/27/22  Yes Elgergawy, Silver Huguenin, MD  Fluocinolone Acetonide 0.01 % OIL Place 5 drops in ear(s) daily. 07/25/22  Yes [provider]  insulin aspart (NOVOLOG) 100 UNIT/ML injection Inject 0-9 Units into the skin 3 (three) times daily with meals. 11/26/22  Yes Elgergawy, Silver Huguenin, MD  insulin degludec  (TRESIBA FLEXTOUCH) 100 UNIT/ML FlexTouch Pen Inject 12 Units into the skin at bedtime. 11/26/22  Yes Elgergawy, Silver Huguenin, MD  ipratropium-albuterol (DUONEB) 0.5-2.5 (3) MG/3ML SOLN Take 3 mLs by nebulization every 6 (six) hours as needed. 11/26/22  Yes Elgergawy, Silver Huguenin, MD  levothyroxine (SYNTHROID) 88 MCG tablet Take 88 mcg by mouth daily. 07/25/22  Yes [provider]  magnesium hydroxide (MILK OF MAGNESIA) 400 MG/5ML suspension Take 30 mLs by mouth daily as needed for mild constipation.   Yes [provider]  metoprolol succinate (TOPROL-XL) 25 MG 24 hr tablet Take 1 tablet (25 mg total) by mouth daily. 11/27/22  Yes Elgergawy, Silver Huguenin, MD  Multiple Vitamin (MULTIVITAMIN WITH MINERALS) TABS tablet Take 1 tablet by mouth daily. 11/27/22  Yes Elgergawy, Silver Huguenin, MD  omeprazole (PRILOSEC OTC) 20 MG tablet Take 40 mg by mouth daily.   Yes [provider]  ondansetron (ZOFRAN) 4 MG tablet Take 1 tablet (4 mg total) by mouth every 8 (eight) hours as needed for nausea or vomiting. 11/26/22  Yes Elgergawy, Silver Huguenin, MD  rosuvastatin (CRESTOR) 20 MG tablet Take 1 tablet (20 mg total) by mouth daily. 06/20/22 12/17/22 Yes Adrian Prows, MD  Skin Protectants, Misc. (EUCERIN) cream Apply 1 Application topically 2 (two) times daily.   Yes [provider]  Tiotropium Bromide-Olodaterol (STIOLTO RESPIMAT) 2.5-2.5 MCG/ACT AERS Inhale 2 puffs into the lungs daily. 08/26/22  Yes Freddi Starr, MD  feeding supplement (ENSURE ENLIVE / ENSURE PLUS) LIQD Take 237 mLs by mouth 2 (two) times daily between meals. 11/26/22   Elgergawy, Silver Huguenin, MD  furosemide (LASIX) 20 MG tablet Take 1 tablet (20 mg total) by mouth daily. Patient not taking: Reported on 12/01/2022 11/26/22 11/26/23  Elgergawy, Silver Huguenin, MD  Insulin Pen Needle (NOVOFINE PLUS PEN NEEDLE) 32G X 4 MM MISC ONE 04/10/22   [provider]  lactose free nutrition (BOOST) LIQD Take 237 mLs by mouth daily.     [provider]  pantoprazole (PROTONIX) 40 MG tablet Take 1 tablet (40 mg total) by mouth daily. Patient not taking: Reported on 11/12/2022 08/26/22   Freddi Starr, MD  traZODone (DESYREL) 50 MG tablet Take 1 tablet (50 mg total) by mouth at bedtime as needed for sleep. 11/26/22   Elgergawy, Silver Huguenin, MD   Allergies  Allergen Reactions   Codeine Nausea And Vomiting   Hydrocodone-Acetaminophen Nausea And Vomiting   Review of Systems  Constitutional:  Positive for fatigue.  Respiratory:  Positive for shortness of breath.   Neurological:  Positive for weakness.    Physical Exam Constitutional:      Appearance: She is cachectic. She is ill-appearing.  Cardiovascular:     Rate and Rhythm: Normal rate.  Pulmonary:     Effort: Tachypnea present.  Musculoskeletal:     Comments: Generalized weakness and muscle atrophy  Skin:    General: Skin is warm and dry.  Neurological:     Mental Status: She is alert and oriented to person, place, and time.     Vital Signs: BP 103/73 (BP Location: Left Arm)   Pulse (!) 107   Temp 97.9 F (36.6 C)   Resp 20   Ht 5\' 2"  (1.575 m)   Wt 56.4 kg   SpO2 91%   BMI 22.74 kg/m  Pain Scale: 0-10   Pain Score: 2    SpO2: SpO2: 91 % O2 Device:SpO2: 91 % O2 Flow Rate: .O2 Flow Rate (L/min): 45 L/min  IO: Intake/output summary:  Intake/Output Summary (Last 24 hours) at 12/03/2022 0829 Last data filed at 12/03/2022 0600 Gross per 24 hour  Intake 483 ml  Output 1800 ml  Net -1317 ml    LBM:   Baseline  Weight: Weight: 54.4 kg Most recent weight: Weight: 56.4 kg     Palliative Assessment/Data: 40 %   Discussed with Dr Ouida Sills and [facility] team   Time In: 1200 Time Out: 1315 Time Total: 75 minutes Greater than 50%  of this time was spent counseling and coordinating care related to the above assessment and plan.  Signed by: Deanna Lessen, NP   Please contact Palliative Medicine Team phone at [phone] for questions and  concerns.  For individual provider: See Shea Evans

## 2022-12-04 DIAGNOSIS — Z515 Encounter for palliative care: Secondary | ICD-10-CM

## 2022-12-04 DIAGNOSIS — E038 Other specified hypothyroidism: Secondary | ICD-10-CM

## 2022-12-04 DIAGNOSIS — D3A090 Benign carcinoid tumor of the bronchus and lung: Secondary | ICD-10-CM | POA: Diagnosis not present

## 2022-12-04 DIAGNOSIS — R531 Weakness: Secondary | ICD-10-CM

## 2022-12-04 DIAGNOSIS — J9621 Acute and chronic respiratory failure with hypoxia: Secondary | ICD-10-CM | POA: Diagnosis not present

## 2022-12-04 DIAGNOSIS — D649 Anemia, unspecified: Secondary | ICD-10-CM

## 2022-12-04 DIAGNOSIS — Z8673 Personal history of transient ischemic attack (TIA), and cerebral infarction without residual deficits: Secondary | ICD-10-CM

## 2022-12-04 DIAGNOSIS — J9601 Acute respiratory failure with hypoxia: Secondary | ICD-10-CM | POA: Diagnosis not present

## 2022-12-04 DIAGNOSIS — I482 Chronic atrial fibrillation, unspecified: Secondary | ICD-10-CM

## 2022-12-04 LAB — HEMOGLOBIN AND HEMATOCRIT, BLOOD
HCT: 28.9 % — ABNORMAL LOW (ref 36.0–46.0)
Hemoglobin: 9.4 g/dL — ABNORMAL LOW (ref 12.0–15.0)

## 2022-12-04 LAB — CBC
HCT: 24.1 % — ABNORMAL LOW (ref 36.0–46.0)
Hemoglobin: 7.8 g/dL — ABNORMAL LOW (ref 12.0–15.0)
MCH: 28.2 pg (ref 26.0–34.0)
MCHC: 32.4 g/dL (ref 30.0–36.0)
MCV: 87 fL (ref 80.0–100.0)
Platelets: 170 10*3/uL (ref 150–400)
RBC: 2.77 MIL/uL — ABNORMAL LOW (ref 3.87–5.11)
RDW: 14.6 % (ref 11.5–15.5)
WBC: 6.9 10*3/uL (ref 4.0–10.5)
nRBC: 0 % (ref 0.0–0.2)

## 2022-12-04 LAB — BASIC METABOLIC PANEL
Anion gap: 10 (ref 5–15)
BUN: 49 mg/dL — ABNORMAL HIGH (ref 8–23)
CO2: 26 mmol/L (ref 22–32)
Calcium: 8.3 mg/dL — ABNORMAL LOW (ref 8.9–10.3)
Chloride: 94 mmol/L — ABNORMAL LOW (ref 98–111)
Creatinine, Ser: 1.62 mg/dL — ABNORMAL HIGH (ref 0.44–1.00)
GFR, Estimated: 31 mL/min — ABNORMAL LOW (ref >60)
Glucose, Bld: 395 mg/dL — ABNORMAL HIGH (ref 70–99)
Potassium: 3.9 mmol/L (ref 3.5–5.1)
Sodium: 130 mmol/L — ABNORMAL LOW (ref 135–145)

## 2022-12-04 LAB — GLUCOSE, CAPILLARY
Glucose-Capillary: 366 mg/dL — ABNORMAL HIGH (ref 70–99)
Glucose-Capillary: 343 mg/dL — ABNORMAL HIGH (ref 70–99)
Glucose-Capillary: 359 mg/dL — ABNORMAL HIGH (ref 70–99)
Glucose-Capillary: 486 mg/dL — ABNORMAL HIGH (ref 70–99)

## 2022-12-04 LAB — PREPARE RBC (CROSSMATCH)

## 2022-12-04 MED ORDER — INSULIN ASPART 100 UNIT/ML IJ SOLN
10.0000 [IU] | Freq: Once | INTRAMUSCULAR | Status: AC
  Administered 2022-12-04: 10 [IU] via SUBCUTANEOUS
  Filled 2022-12-04: qty 1

## 2022-12-04 MED ORDER — MORPHINE SULFATE (PF) 2 MG/ML IV SOLN
0.5000 mg | Freq: Three times a day (TID) | INTRAVENOUS | Status: AC
  Administered 2022-12-04 – 2022-12-05 (×3): 0.5 mg via INTRAVENOUS
  Filled 2022-12-04 (×3): qty 1

## 2022-12-04 MED ORDER — INSULIN GLARGINE-YFGN 100 UNIT/ML ~~LOC~~ SOLN
15.0000 [IU] | Freq: Every day | SUBCUTANEOUS | Status: DC
  Administered 2022-12-04 – 2022-12-05 (×2): 15 [IU] via SUBCUTANEOUS
  Filled 2022-12-04 (×2): qty 0.15

## 2022-12-04 MED ORDER — SODIUM CHLORIDE 0.9% IV SOLUTION: Freq: Once | INTRAVENOUS | Status: AC

## 2022-12-04 MED ORDER — PREDNISONE 10 MG PO TABS
10.0000 mg | ORAL_TABLET | Freq: Every day | ORAL | Status: AC
  Administered 2022-12-05 – 2022-12-08 (×4): 10 mg via ORAL
  Filled 2022-12-04 (×4): qty 1

## 2022-12-04 NOTE — NC FL2 (Signed)
[place] LEVEL OF CARE FORM     IDENTIFICATION  Patient Name: [name] Birthdate: 1934-07-27 Sex: female Admission Date (Current Location): 12/01/2022  [place] and [place] Number:  Engineering geologist and Address:  [facility], [address], [place], [place] [address]      Provider Number:    Attending Physician Name and Address:  Richarda Osmond, MD  Relative Name and Phone Number:  Remo Lipps [phone]); [name] [phone]); Rise Paganini [phone])    Current Level of Care: Hospital Recommended Level of Care: [place] Prior Approval Number:    Date Approved/Denied:   PASRR Number: [id number]  Discharge Plan: SNF    Current Diagnoses: Patient Active Problem List   Diagnosis Date Noted   HAP (hospital-acquired pneumonia) 12/03/2022   Anxiety 12/03/2022   Malignant neoplasm of lung ([place]) 12/03/2022   Acute hypoxemic respiratory failure ([place]) 12/03/2022   Acute on chronic respiratory failure with hypoxemia ([place]) 12/02/2022   Acute hypoxic respiratory failure ([place]) 12/01/2022   Stage 3b chronic kidney disease (CKD) ([place]) 12/01/2022   Protein-calorie malnutrition, severe 11/18/2022   Hypoxia 11/12/2022   Chronic atrial fibrillation with rapid ventricular response ([place]) 08/16/2018   Chest pain 07/08/2017   ARF (acute renal failure) ([place]) 07/08/2017   Acute diverticulitis 07/08/2017   Head injury    Right hip pain 01/03/2017   Muscle strain 01/03/2017   Atrial fibrillation with rapid ventricular response ([place]) 12/12/2016   Atrial fibrillation with RVR (HCC)    Diabetes mellitus with complication (HCC)    Faintness    Thrombocytopenia (HCC) 04/07/2016   Anemia 04/07/2016   SOB (shortness of breath) on exertion    Syncope and collapse    Syncope 04/02/2016   Physical deconditioning 04/20/2015   Dyspnea 04/17/2015   SOB (shortness of breath) 04/17/2015   Acute renal failure superimposed on  stage 3 chronic kidney disease ([place]) 04/17/2015   Hypokalemia 04/17/2015   Atrial fibrillation (HCC) CHA2DS2-VASc Score 7 03/13/2012   HTN (hypertension) 03/13/2012   Hypothyroid 03/13/2012   DM2 (diabetes mellitus, type 2) ([place]) 03/13/2012   CAD (coronary artery disease) 03/13/2012   Carcinoid tumor of lung    Stroke ([place])     Orientation RESPIRATION BLADDER Height & Weight     Self, Time, Situation, Place  O2 (HFNC) Incontinent, External catheter Weight: 135 lb 5.8 oz (61.4 kg) Height:  5\' 2"  (157.5 cm)  BEHAVIORAL SYMPTOMS/MOOD NEUROLOGICAL BOWEL NUTRITION STATUS     (WNL) Incontinent Supplemental (Ensure Enlive po TID, suplment provides 350 kcal and 20 grms of protein  -MVI w/minerals daily  -Magic cup TID w/ meals, supplement provides 290 kcal and 9 grams of protein Inadequate oral intake related to poor appetite as evidenced by meal completion)  AMBULATORY STATUS COMMUNICATION OF NEEDS Skin   Limited Assist Verbally Normal                       Personal Care Assistance Level of Assistance  Bathing, Feeding, Dressing Bathing Assistance: Maximum assistance Feeding assistance: Limited assistance       Functional Limitations Info  Sight, Hearing Sight Info: Impaired (left & right) Hearing Info: Impaired (left & right)      SPECIAL CARE FACTORS FREQUENCY  PT (By licensed PT), Restorative feeding program     PT Frequency: Min 3X/week     Restorative Feeding Program Frequency: Ensure Enlive po TID, each supplement provides 350 kcal and 20 grams of protein.   -  MVI with minerals daily  -Magic cup TID with meals, each supplement provides 290 kcal and 9 grams of protein        Contractures Contractures Info: Not present    Additional Factors Info    Code Status Info: DNR Allergies Info: n/a           Current Medications (12/04/2022):  This is the current hospital active medication list Current Facility-Administered Medications  Medication Dose Route Frequency  Provider Last Rate Last Admin   acetaminophen (TYLENOL) tablet 650 mg  650 mg Oral Q6H PRN Jose Persia, MD   650 mg at 12/02/22 2052   Or   acetaminophen (TYLENOL) suppository 650 mg  650 mg Rectal Q6H PRN Jose Persia, MD       apixaban Arne Cleveland) tablet 2.5 mg  2.5 mg Oral BID Jose Persia, MD   2.5 mg at 12/04/22 0825   arformoterol (BROVANA) nebulizer solution 15 mcg  15 mcg Nebulization BID Jose Persia, MD   15 mcg at 12/04/22 0810   And   umeclidinium bromide (INCRUSE ELLIPTA) 62.5 MCG/ACT 1 puff  1 puff Inhalation Daily Jose Persia, MD   1 puff at 12/04/22 0839   budesonide (PULMICORT) nebulizer solution 0.5 mg  0.5 mg Nebulization BID Jose Persia, MD   0.5 mg at 12/04/22 0802   ceFEPIme (MAXIPIME) 2 g in sodium chloride 0.9 % 100 mL IVPB  2 g Intravenous Q24H Mickeal Skinner A, RPH 200 mL/hr at 12/03/22 1553 2 g at 12/03/22 1553   diltiazem (CARDIZEM CD) 24 hr capsule 120 mg  120 mg Oral Daily Jose Persia, MD   120 mg at 12/04/22 0825   feeding supplement (ENSURE ENLIVE / ENSURE PLUS) liquid 237 mL  237 mL Oral TID BM Doristine Mango L, MD   237 mL at 12/04/22 0825   furosemide (LASIX) injection 40 mg  40 mg Intravenous Q12H Fritzi Mandes, MD   40 mg at 12/04/22 0039   insulin aspart (novoLOG) injection 0-9 Units  0-9 Units Subcutaneous TID WC Jose Persia, MD   9 Units at 12/03/22 1657   insulin glargine-yfgn (SEMGLEE) injection 15 Units  15 Units Subcutaneous Daily Richarda Osmond, MD   15 Units at 12/04/22 0838   ipratropium (ATROVENT) nebulizer solution 0.5 mg  0.5 mg Nebulization Q6H Doristine Mango L, MD   0.5 mg at 12/04/22 0800   ipratropium-albuterol (DUONEB) 0.5-2.5 (3) MG/3ML nebulizer solution 3 mL  3 mL Nebulization Q6H PRN Fritzi Mandes, MD       levalbuterol Penne Lash) nebulizer solution 0.63 mg  0.63 mg Nebulization Q6H Doristine Mango L, MD   0.63 mg at 12/04/22 0800   levothyroxine (SYNTHROID) tablet 88 mcg  88 mcg Oral Q0600 Jose Persia, MD   88 mcg at 12/04/22 0510   metoprolol tartrate (LOPRESSOR) tablet 25 mg  25 mg Oral BID Richarda Osmond, MD   25 mg at 12/04/22 0825   multivitamin with minerals tablet 1 tablet  1 tablet Oral Daily Richarda Osmond, MD   1 tablet at 12/04/22 0825   ondansetron (ZOFRAN) tablet 4 mg  4 mg Oral Q6H PRN Jose Persia, MD       Or   ondansetron (ZOFRAN) injection 4 mg  4 mg Intravenous Q6H PRN Jose Persia, MD       polyethylene glycol (MIRALAX / GLYCOLAX) packet 17 g  17 g Oral Daily PRN Jose Persia, MD       rosuvastatin (CRESTOR) tablet 20 mg  20 mg Oral Daily Jose Persia, MD   20 mg at 12/04/22 0825   sodium chloride flush (NS) 0.9 % injection 3 mL  3 mL Intravenous Q12H Jose Persia, MD   3 mL at 12/04/22 1655     Discharge Medications: Please see discharge summary for a list of discharge medications.  Relevant Imaging Results:  Relevant Lab Results:   Additional Information [id number]  Judeen Hammans Gabor Lusk, LCSW

## 2022-12-04 NOTE — Inpatient Diabetes Management (Signed)
Inpatient Diabetes Program Recommendations  AACE/ADA: New Consensus Statement on Inpatient Glycemic Control (2015)  Target Ranges:  Prepandial:   less than 140 mg/dL      Peak postprandial:   less than 180 mg/dL (1-2 hours)      Critically ill patients:  140 - 180 mg/dL   Lab Results  Component Value Date   GLUCAP 343 (H) 12/04/2022   HGBA1C 5.8 (H) 11/12/2022    Review of Glycemic Control  Latest Reference Range & Units 12/03/22 08:16 12/03/22 12:32 12/03/22 16:51 12/04/22 08:03 12/04/22 11:34  Glucose-Capillary 70 - 99 mg/dL 289 (H) 468 (H) 389 (H) 366 (H) 343 (H)  (H): Data is abnormally high  Diabetes history: DM2 Outpatient Diabetes medications:  Tresiba 12 units QHS Novolog 0-9 units TID Current orders for Inpatient glycemic control:  Semglee 15 units QD Novolog 0-9 units TID  Inpatient Diabetes Program Recommendations:    Appears steroids have been discontinued as of this morning.  Glucose trends elevated.  Please consider:  Semglee 12 units QD Novolog 0-5 units QHS Novolog 3 units TID with meals  Will continue to follow while inpatient.  Thank you, Reche Dixon, MSN, [place] Diabetes Coordinator Inpatient Diabetes Program [phone] (team pager from 8a-5p)

## 2022-12-04 NOTE — Progress Notes (Addendum)
PROGRESS NOTE  [name]    DOB: 1934/04/13, 88 y.o.  CWU:[id number]    Code Status: DNR   DOA: 12/01/2022   LOS: 2   Brief hospital course  [name] is a 88 y.o. female with a PMH significant for  CAD, pulmonary hypertension, permanent atrial fibrillation, type 2 diabetes, hypertension, grade 4 low-grade lung carcinoid/neuroendocrine tumor.  They presented from [facility] to the ED on 12/01/2022 with SOB for several months but acutely worsened x 2 days. She had episodes of nausea and vomiting after eating day prior to arrival.  She was recently discharged to the SNF on 2 to 3 L of oxygen following an admission for acute hypoxic respiratory failure secondary to acute on chronic HFpEF and stage IV low-grade lung carcinoid tumor.  In the ED, it was found that they had tachycardia to 151, blood pressure 127/72, hypoxic at her discharge oxygen level to 85% O2 which improved to normal levels with increasing oxygen up to 6 L.  She had significant increased work of breathing and was started on heated high flow nasal cannula.  Significant findings included normal pCO2. BNP improved compared to prior at 228.  White blood count within normal limits.  Influenza and COVID-19 PCR negative.  Chest x-ray with bibasilar collapse/consolidation due to atelectasis versus pneumonia.  They were initially treated with vancomycin and cefepime for concern of HCAP .   Patient was admitted to medicine service for further workup and management of acute on chronic hypoxic respiratory failure as outlined in detail below.  12/04/22 -stable, frail  Assessment & Plan  Principal Problem:   Acute hypoxic respiratory failure (HCC) Active Problems:   Atrial fibrillation with RVR (HCC)   CAD (coronary artery disease)   DM2 (diabetes mellitus, type 2) (HCC)   Stage 3b chronic kidney disease (CKD) (HCC)   Acute on chronic respiratory failure with hypoxemia (HCC)   HAP (hospital-acquired pneumonia)   Anxiety    Malignant neoplasm of lung (HCC)   Acute hypoxemic respiratory failure (HCC)  Acute on chronic hypoxic respiratory failure H/o carcinoid tumor of lung- Respiratory panel negative. Blood culture negative. S/p receiving morphine and blood products today, RT able to wean pt to HFNC 15L and maintain sats >92% at rest. She clinically appeared to be frail and anxious with increased work of breathing still but improving. She agreed to morphine trial for air hunger. She is DNR so would not intubate if she worsens.  Palliative met with patient and her son yesterday. Recs pending.  -- continue to wean oxygen as able.  -- patient received vacnomycin and cefepime  - continue cefepime and adjust length based on clinical improvement -Palliative consulted, appreciate recs -Continue breathing treatments -Continue steroids PO at lower dose tomorrow   Atrial fibrillation with RVR CAD -- in the setting of hypoxia and underlying illness -- resume beta-blockers and diltiazem -- on eliquis - consider stopping statin given prognosis <10 years  Normocytic anemia- hgb 7.8 today which is below transfusion threshold with h/o CAD. Discussed indications for transfusion with patient and I believe it could improve her respiratory status to increase oxygen carrying cells. She agreed to transfusion. No signs of active bleeding. She remains on eliquis for stroke prevention with afib  - type and cross - 1 unit pRBCs - repeat CBC post transfusion and am   Type II diabetes- poorly controlled given steroid use. Dose decrease tomorrow so will likely improve. -- sliding scale insulin - Increase long-acting dose to cover for  hyperglycemia with steroids   CKD 3B  hyponatremia- Na+ 130 today --renal function stable, continue to monitor  Hypothyroidism - continue home med  Body mass index is 24.76 kg/m.  VTE ppx: apixaban (ELIQUIS) tablet 2.5 mg Start: 12/01/22 2200 apixaban (ELIQUIS) tablet 2.5 mg   Diet:     Diet    DIET DYS 3 Room service appropriate? Yes; Fluid consistency: Thin   Consultants: Palliative  Subjective 12/04/22    Pt reports feeling about the same today or mildly improved. She expresses significant anxiety about dying and expresses that she wants everything she can have done to get her better.    Objective   Vitals:   12/04/22 0036 12/04/22 0106 12/04/22 0346 12/04/22 0500  BP: 114/82  112/65   Pulse: 76  (!) 104   Resp: 18  20   Temp: 98.1 F (36.7 C)  98.2 F (36.8 C)   TempSrc:      SpO2: 95% 93% 92%   Weight: 61.4 kg   61.4 kg  Height:        Intake/Output Summary (Last 24 hours) at 12/04/2022 0803 Last data filed at 12/04/2022 0500 Gross per 24 hour  Intake 723 ml  Output 1400 ml  Net -677 ml    Filed Weights   12/03/22 0455 12/04/22 0036 12/04/22 0500  Weight: 56.4 kg 61.4 kg 61.4 kg     Physical Exam:  General: awake, alert, frail, anxious HEENT: atraumatic, clear conjunctiva, anicteric sclera, MMM, hard of hearing Respiratory: Increased work of breathing, decreased lung sounds throughout Cardiovascular: Delayed capillary refill, regular rhythm, Nervous: A&O x3. no gross focal neurologic deficits, normal speech Extremities: moves all equally, no edema, low tone Skin: dry, intact, normal temperature, normal color. No rashes, lesions or ulcers on exposed skin Psychiatry: Anxious mood, congruent affect  Labs   I have personally reviewed the following labs and imaging studies CBC    Component Value Date/Time   WBC 6.9 12/04/2022 0500   RBC 2.77 (L) 12/04/2022 0500   HGB 7.8 (L) 12/04/2022 0500   HGB 11.7 09/17/2021 1316   HCT 24.1 (L) 12/04/2022 0500   HCT 36.7 09/17/2021 1316   PLT 170 12/04/2022 0500   PLT 128 (L) 09/17/2021 1316   MCV 87.0 12/04/2022 0500   MCV 86 09/17/2021 1316   MCH 28.2 12/04/2022 0500   MCHC 32.4 12/04/2022 0500   RDW 14.6 12/04/2022 0500   RDW 16.0 (H) 09/17/2021 1316   LYMPHSABS 1.0 12/02/2022 0433   MONOABS 1.2  (H) 12/02/2022 0433   EOSABS 0.0 12/02/2022 0433   BASOSABS 0.0 12/02/2022 0433      Latest Ref Rng & Units 12/04/2022    5:00 AM 12/02/2022    4:33 AM 12/01/2022    3:03 PM  BMP  Glucose 70 - 99 mg/dL 395  158  210   BUN 8 - 23 mg/dL 49  30  37   Creatinine 0.44 - 1.00 mg/dL 1.62  1.33  1.41   Sodium 135 - 145 mmol/L 130  135  135   Potassium 3.5 - 5.1 mmol/L 3.9  4.3  4.5   Chloride 98 - 111 mmol/L 94  102  98   CO2 22 - 32 mmol/L _0 Calcium 8.9 - 10.3 mg/dL 8.3  8.6  9.0     No results found.  Disposition Plan & Communication  Patient status: Inpatient  Admitted From: SNF Planned disposition location: Skilled nursing facility Anticipated discharge  date: 12/10 pending clinical improvement  Family Communication: None   Author: Chelsey L Anderson, DO [facility] 12/04/2022, 8:03 AM   Available by Epic secure chat 7AM-7PM. If 7PM-7AM, please contact night-coverage.  [facility] contact information found on [email or web address].  

## 2022-12-04 NOTE — Care Management Important Message (Signed)
Important Message  Patient Details  Name: [name] MRN: [id number] Date of Birth: June 28, 1934   Medicare Important Message Given:  N/A - LOS <3 / Initial given by admissions     [name] 12/04/2022, 4:37 PM

## 2022-12-04 NOTE — Progress Notes (Signed)
Patient ID: [name], female   DOB: [birth year], 88 y.o.   MRN: [id number]    Progress Note from the Palliative Medicine Team at [facility]   Patient Name: [name]        Date: 12/04/2022 DOB: 10-31-1934  Age: 88 y.o. MRN#: [id number] Attending Physician: Richarda Osmond, MD Primary Care Physician: [name], FNP Admit Date: 12/01/2022   Medical records reviewed   88 y.o. female   admitted on 12/01/2022 with past medical history significant of CAD, pulmonary hypertension, permanent atrial fibrillation, type 2 diabetes, hypertension, grade 4 low-grade lung carcinoid/neuroendocrine tumor, who presents to the ED with complaints of increased shortness of breath.   Recently admitted from 11/14 to 11/29 for acute hypoxic respiratory failure felt to be secondary to acute on chronic HFpEF and stage IV low-grade lung carcinoid tumor.  She was discharged on supplemental oxygen, 2-3 L to maintain oxygen saturation above 88%.   Reported  worsening shortness of breath and dyspnea on exertion for the last couple months.  Since she has been discharged to SNF, her breathing has not significantly improved. Nausea and vomiting after eating ongoing.      Of note, patient was recently admitted from 11/14 to 11/29 for acute hypoxic respiratory failure felt to be secondary to acute on chronic HFpEF and stage IV low-grade lung carcinoid tumor.  She was discharged on supplemental oxygen, 2-3 L to maintain oxygen saturation above 88%.  Admitted for treatment and stabilization   Patient and family face treatment option decisions,  advanced directive decisions and anticipatory care needs.   Clinical Assessment and Goals of Care:   This NP Wadie Lessen reviewed medical records, received report from team, assessed the patient and then meet at the patient's bedside, her son/Steve is at bedside as f/u for palliative medicine needs and emotional support.   Of note, This nurse practitioner had  multiple meetings with patient and family on admission back in November at [facility].     Continued conversation regarding current medical situation and my concern for her ongoing weakness and failure to thrive.  Patient recognizes the seriousness.  She speaks very strongly to her desire to get well, she hopes for time and improvement.   Plan of care: -DNR/DNI -open to all offered and available medical interventions to prolong life. -return to SNF for ongoing rehab -recommend OP palliative services   Education offered today regarding  the importance of continued conversation with family and the medical providers regarding overall plan of care and treatment options,  ensuring decisions are within the context of the patients values and GOCs.  Patient is high risk for decompensation  Questions and concerns addressed   Discussed with treatment team  25 minutes   Wadie Lessen NP  Palliative Medicine Team Team Phone # [phone] Pager [phone]

## 2022-12-05 DIAGNOSIS — I10 Essential (primary) hypertension: Secondary | ICD-10-CM | POA: Diagnosis not present

## 2022-12-05 DIAGNOSIS — I2510 Atherosclerotic heart disease of native coronary artery without angina pectoris: Secondary | ICD-10-CM | POA: Diagnosis not present

## 2022-12-05 DIAGNOSIS — E038 Other specified hypothyroidism: Secondary | ICD-10-CM | POA: Diagnosis not present

## 2022-12-05 DIAGNOSIS — J9601 Acute respiratory failure with hypoxia: Secondary | ICD-10-CM | POA: Diagnosis not present

## 2022-12-05 LAB — GLUCOSE, CAPILLARY
Glucose-Capillary: 505 mg/dL (ref 70–99)
Glucose-Capillary: 511 mg/dL (ref 70–99)
Glucose-Capillary: 517 mg/dL (ref 70–99)
Glucose-Capillary: 484 mg/dL — ABNORMAL HIGH (ref 70–99)
Glucose-Capillary: 365 mg/dL — ABNORMAL HIGH (ref 70–99)
Glucose-Capillary: 308 mg/dL — ABNORMAL HIGH (ref 70–99)
Glucose-Capillary: 187 mg/dL — ABNORMAL HIGH (ref 70–99)

## 2022-12-05 LAB — CBC
HCT: 30.7 % — ABNORMAL LOW (ref 36.0–46.0)
Hemoglobin: 10 g/dL — ABNORMAL LOW (ref 12.0–15.0)
MCH: 27.9 pg (ref 26.0–34.0)
MCHC: 32.6 g/dL (ref 30.0–36.0)
MCV: 85.5 fL (ref 80.0–100.0)
Platelets: 160 10*3/uL (ref 150–400)
RBC: 3.59 MIL/uL — ABNORMAL LOW (ref 3.87–5.11)
RDW: 15.1 % (ref 11.5–15.5)
WBC: 7.6 10*3/uL (ref 4.0–10.5)
nRBC: 0 % (ref 0.0–0.2)

## 2022-12-05 LAB — BLOOD GAS, VENOUS
Acid-Base Excess: 7.2 mmol/L — ABNORMAL HIGH (ref 0.0–2.0)
Bicarbonate: 32.6 mmol/L — ABNORMAL HIGH (ref 20.0–28.0)
O2 Saturation: 44.9 %
Patient temperature: 37
pCO2, Ven: 48 mmHg (ref 44–60)
pH, Ven: 7.44 — ABNORMAL HIGH (ref 7.25–7.43)
pO2, Ven: 34 mmHg (ref 32–45)

## 2022-12-05 LAB — GLUCOSE, RANDOM: Glucose, Bld: 547 mg/dL (ref 70–99)

## 2022-12-05 MED ORDER — MORPHINE SULFATE (PF) 2 MG/ML IV SOLN
0.5000 mg | Freq: Three times a day (TID) | INTRAVENOUS | Status: DC
  Administered 2022-12-05 – 2022-12-08 (×9): 0.5 mg via INTRAVENOUS
  Filled 2022-12-05 (×9): qty 1

## 2022-12-05 MED ORDER — ORAL CARE MOUTH RINSE: 15.0000 mL | OROMUCOSAL | Status: DC | PRN

## 2022-12-05 MED ORDER — INSULIN GLARGINE-YFGN 100 UNIT/ML ~~LOC~~ SOLN
25.0000 [IU] | Freq: Every day | SUBCUTANEOUS | Status: DC
  Administered 2022-12-06 – 2022-12-09 (×4): 25 [IU] via SUBCUTANEOUS
  Filled 2022-12-05 (×4): qty 0.25

## 2022-12-05 MED ORDER — DILTIAZEM HCL ER COATED BEADS 180 MG PO CP24
180.0000 mg | ORAL_CAPSULE | Freq: Every day | ORAL | Status: DC
  Administered 2022-12-06 – 2022-12-07 (×2): 180 mg via ORAL
  Filled 2022-12-05 (×2): qty 1

## 2022-12-05 MED ORDER — INSULIN ASPART 100 UNIT/ML IJ SOLN
11.0000 [IU] | Freq: Once | INTRAMUSCULAR | Status: AC
  Administered 2022-12-05: 11 [IU] via SUBCUTANEOUS
  Filled 2022-12-05: qty 1

## 2022-12-05 MED ORDER — INSULIN ASPART 100 UNIT/ML IJ SOLN
10.0000 [IU] | Freq: Once | INTRAMUSCULAR | Status: AC
  Administered 2022-12-05: 10 [IU] via SUBCUTANEOUS
  Filled 2022-12-05: qty 1

## 2022-12-05 MED ORDER — INSULIN ASPART 100 UNIT/ML IJ SOLN
0.0000 [IU] | Freq: Three times a day (TID) | INTRAMUSCULAR | Status: DC
  Administered 2022-12-05: 15 [IU] via SUBCUTANEOUS
  Administered 2022-12-06: 7 [IU] via SUBCUTANEOUS
  Administered 2022-12-06: 20 [IU] via SUBCUTANEOUS
  Administered 2022-12-06: 4 [IU] via SUBCUTANEOUS
  Administered 2022-12-07: 20 [IU] via SUBCUTANEOUS
  Administered 2022-12-07 (×2): 7 [IU] via SUBCUTANEOUS
  Administered 2022-12-08: 20 [IU] via SUBCUTANEOUS
  Administered 2022-12-08: 11 [IU] via SUBCUTANEOUS
  Administered 2022-12-08 – 2022-12-09 (×2): 4 [IU] via SUBCUTANEOUS
  Administered 2022-12-09: 15 [IU] via SUBCUTANEOUS
  Filled 2022-12-05 (×12): qty 1

## 2022-12-05 NOTE — Progress Notes (Signed)
Chaplain attempted morning visit, as attempts last night pt was resting. Pt was with care team at this time. This chaplain will pass on to Day Shift and ensure pt is visited today. Please page [place] for any immediate needs. [phone]

## 2022-12-05 NOTE — Inpatient Diabetes Management (Signed)
Inpatient Diabetes Program Recommendations  AACE/ADA: New Consensus Statement on Inpatient Glycemic Control   Target Ranges:  Prepandial:   less than 140 mg/dL      Peak postprandial:   less than 180 mg/dL (1-2 hours)      Critically ill patients:  140 - 180 mg/dL    Latest Reference Range & Units 12/04/22 08:03 12/04/22 11:34 12/04/22 16:14 12/04/22 22:15 12/05/22 07:31 12/05/22 07:32  Glucose-Capillary 70 - 99 mg/dL 366 (H) 343 (H) 359 (H) 486 (H) 511 (HH) 505 (HH)   Review of Glycemic Control  Diabetes history: DM2 Outpatient Diabetes medications: Tresiba 12 units daily, Novolog 0-9 units TID with meals Current orders for Inpatient glycemic control: Semglee 15 units daily, Novolog 0-9 units TID with meals; Prednisone 10 mg QAM, Enlive 237 ml TID  Inpatient Diabetes Program Recommendations:    Insulin: Glucose 505 mg/dl at 7:32 am and lab glucose 517 mg/dl at 9:09 am. Please consider ordering one time Semglee 25 units x1 now and resume Semglee 15 units daily starting on 12/06/22. Please consider ordering Novolog 3 units TID with meals for meal coverage if patient eats at least 50% of meals.  Supplements: If appropriate, consider changing Ensure Enlive to Ensure Max supplements as they have less carbohydrates.  Thanks,  Barnie Alderman, RN, MSN, [place] Diabetes Coordinator Inpatient Diabetes Program [phone] (Team Pager from 8am to [place])

## 2022-12-05 NOTE — Progress Notes (Signed)
Per Dr. Ouida Sills give one time dose of 10 units Novalog and go ahead and give her scheduled semglee.

## 2022-12-05 NOTE — Progress Notes (Signed)
   12/05/22 1100  Clinical Encounter Type  Visited With Patient  Visit Type Initial  Referral From Nurse  Consult/Referral To Chaplain  Spiritual Encounters  Spiritual Needs Prayer   Chaplain responded to [facility] consult to visit with patient. Patient complained of dizziness and did not have energy to talk. Patient requested prayer.

## 2022-12-05 NOTE — TOC Initial Note (Signed)
Transition of Care [facility]) - Initial/Assessment Note    Patient Details  Name: [name] MRN: [id number] Date of Birth: [birth year]  Transition of Care [facility]) CM/SW Contact:    Candie Chroman, LCSW Phone Number: 12/05/2022, 2:47 PM  Clinical Narrative:  CSW met with patient. No supports at bedside. CSW introduced role and explained that discharge planning would be discussed. Patient confirmed she was admitted from [facility] and plan is to return there at discharge to continue her rehab. Patient discharged to the facility on 11/29 and readmitted to the hospital on 12/4. CSW confirmed with admissions coordinator on 12/6 that she can return once stable for discharge. Patient still requiring HFNC 12 L. Will continue to follow patient for support and facilitate return once stable. PT/OT evaluations pending. Will need new insurance authorization.                Expected Discharge Plan: Skilled Nursing Facility Barriers to Discharge: Continued Medical Work up   Patient Goals and CMS Choice     Choice offered to / list presented to : Patient  Expected Discharge Plan and Services Expected Discharge Plan: [place]     Post Acute Care Choice: Resumption of Svcs/PTA Provider Living arrangements for the past 2 months: Single Family Home                                      Prior Living Arrangements/Services Living arrangements for the past 2 months: Single Family Home   Patient language and need for interpreter reviewed:: Yes Do you feel safe going back to the place where you live?: Yes      Need for Family Participation in Patient Care: Yes (Comment) Care giver support system in place?: Yes (comment)   Criminal Activity/Legal Involvement Pertinent to Current Situation/Hospitalization: No - Comment as needed  Activities of Daily Living Home Assistive Devices/Equipment: Walker (specify type) ADL Screening (condition at time of admission) Patient's  cognitive ability adequate to safely complete daily activities?: No Is the patient deaf or have difficulty hearing?: Yes Does the patient have difficulty seeing, even when wearing glasses/contacts?: No Does the patient have difficulty concentrating, remembering, or making decisions?: No Patient able to express need for assistance with ADLs?: Yes Does the patient have difficulty dressing or bathing?: Yes Independently performs ADLs?: No Communication: Needs assistance Is this a change from baseline?: Pre-admission baseline Dressing (OT): Needs assistance Is this a change from baseline?: Pre-admission baseline Grooming: Needs assistance Is this a change from baseline?: Pre-admission baseline Feeding: Needs assistance Is this a change from baseline?: Pre-admission baseline Bathing: Needs assistance Is this a change from baseline?: Pre-admission baseline Toileting: Needs assistance Is this a change from baseline?: Pre-admission baseline In/Out Bed: Needs assistance Is this a change from baseline?: Pre-admission baseline Walks in Home: Needs assistance Is this a change from baseline?: Pre-admission baseline Does the patient have difficulty walking or climbing stairs?: Yes Weakness of Legs: Both Weakness of Arms/Hands: Both  Permission Sought/Granted Permission sought to share information with : Facility Art therapist granted to share information with : Yes, Verbal Permission Granted     Permission granted to share info w AGENCY: [facility]        Emotional Assessment Appearance:: Appears stated age Attitude/Demeanor/Rapport: Engaged, Gracious Affect (typically observed): Accepting, Appropriate, Calm, Pleasant Orientation: : Oriented to Self, Oriented to Place, Oriented to  Time, Oriented to  Situation Alcohol / Substance Use: Not Applicable Psych Involvement: No (comment)  Admission diagnosis:  Acute on chronic respiratory failure with hypoxemia (HCC)  [J96.21] Acute hypoxemic respiratory failure (HCC) [J96.01] Acute hypoxic respiratory failure (HCC) [J96.01] Patient Active Problem List   Diagnosis Date Noted   Palliative care encounter 12/04/2022   Normocytic anemia 12/04/2022   History of stroke 12/04/2022   Atrial fibrillation, chronic ([place]) 12/04/2022   HAP (hospital-acquired pneumonia) 12/03/2022   Anxiety 12/03/2022   Malignant neoplasm of lung ([place]) 12/03/2022   Acute hypoxemic respiratory failure ([place]) 12/03/2022   Acute on chronic respiratory failure with hypoxemia ([place]) 12/02/2022   Acute hypoxic respiratory failure ([place]) 12/01/2022   Stage 3b chronic kidney disease (CKD) ([place]) 12/01/2022   Protein-calorie malnutrition, severe 11/18/2022   Hypoxia 11/12/2022   Chronic atrial fibrillation with rapid ventricular response ([place]) 08/16/2018   Chest pain 07/08/2017   ARF (acute renal failure) ([place]) 07/08/2017   Acute diverticulitis 07/08/2017   Head injury    Right hip pain 01/03/2017   Muscle strain 01/03/2017   Atrial fibrillation with rapid ventricular response ([place]) 12/12/2016   Atrial fibrillation with RVR ([place])    Diabetes mellitus with complication (HCC)    Faintness    Thrombocytopenia ([place]) 04/07/2016   Anemia 04/07/2016   SOB (shortness of breath) on exertion    Syncope and collapse    Syncope 04/02/2016   Physical deconditioning 04/20/2015   Dyspnea 04/17/2015   SOB (shortness of breath) 04/17/2015   Acute renal failure superimposed on stage 3 chronic kidney disease ([place]) 04/17/2015   Hypokalemia 04/17/2015   Atrial fibrillation (HCC) CHA2DS2-VASc Score 7 03/13/2012   HTN (hypertension) 03/13/2012   Hypothyroid 03/13/2012   DM2 (diabetes mellitus, type 2) ([place]) 03/13/2012   CAD (coronary artery disease) 03/13/2012   Carcinoid tumor of lung    Stroke [facility])    PCP:  [name], FNP Pharmacy:   [place], [place] [address] Phone: [phone] Fax: [place], [place] [address] [place] [address] Phone: [phone] Fax: [phone]     Social Determinants of Health ([place]) Interventions    Readmission Risk Interventions    12/05/2022    2:46 PM  Readmission Risk Prevention Plan  PCP or Specialist appointment within 3-5 days of discharge Complete  SW Recovery Care/Counseling Consult Complete  Palliative Care Screening Complete  Skilled Nursing Facility Complete

## 2022-12-05 NOTE — Evaluation (Signed)
Physical Therapy Evaluation Patient Details Name: [name] MRN: [id number] DOB: 05/10/1934 Today's Date: 12/05/2022  History of Present Illness  88 y/o female presented to ED on 12/01/22 for SOB and dyspnea on exertion. Admitted for acute respiratory failure 2/2 PNA. PMH: CAD, pulmonary htn, afib, dm, htn, lung CA, stroke.  Clinical Impression  Patient admitted with the above. PTA, patient recently discharged to SNF for rehab as patient was living alone and requiring more assistance. Patient currently presents with weakness, impaired balance, and decreased activity tolerance. Currently requiring 12L HFNC with drops to 85% with minimal mobility and requiring up to 3 minute rest break to increase to 90%. Able to progress OOB to chair with RW and min guard. Encouraged OOB to chair for meals, patient agreeable. Patient will benefit from skilled PT services during acute stay to address listed deficits. Recommend return to SNF at discharge to maximize functional independence and safety.        Recommendations for follow up therapy are one component of a multi-disciplinary discharge planning process, led by the attending physician.  Recommendations may be updated based on patient status, additional functional criteria and insurance authorization.  Follow Up Recommendations Skilled nursing-short term rehab (<3 hours/day) Can patient physically be transported by private vehicle: Yes    Assistance Recommended at Discharge Frequent or constant Supervision/Assistance  Patient can return home with the following  Help with stairs or ramp for entrance;Assistance with cooking/housework;A little help with walking and/or transfers;A little help with bathing/dressing/bathroom;Assist for transportation    Equipment Recommendations None recommended by PT  Recommendations for Other Services       Functional Status Assessment Patient has had a recent decline in their functional status and demonstrates the  ability to make significant improvements in function in a reasonable and predictable amount of time.     Precautions / Restrictions Precautions Precautions: Fall Precaution Comments: watch O2 and HR Restrictions Weight Bearing Restrictions: No      Mobility  Bed Mobility Overal bed mobility: Needs Assistance Bed Mobility: Supine to Sit     Supine to sit: Min guard, HOB elevated     General bed mobility comments: increased time to complete but able to without assistance    Transfers Overall transfer level: Needs assistance Equipment used: Rolling Jesilyn Easom (2 wheels) Transfers: Sit to/from Stand, Bed to chair/wheelchair/BSC Sit to Stand: Min guard   Step pivot transfers: Min guard       General transfer comment: min guard to stand from EOB and take pivotal steps towards recliner. SpO2 dropped to 86% on 12 L HFNC    Ambulation/Gait                  Stairs            Wheelchair Mobility    Modified Rankin (Stroke Patients Only)       Balance Overall balance assessment: Needs assistance Sitting-balance support: Feet supported Sitting balance-Leahy Scale: Fair     Standing balance support: Bilateral upper extremity supported, During functional activity Standing balance-Leahy Scale: Fair                               Pertinent Vitals/Pain Pain Assessment Pain Assessment: Faces Faces Pain Scale: No hurt Pain Intervention(s): Monitored during session    Home Living Family/patient expects to be discharged to:: Skilled nursing facility  Additional Comments: Prior to previous admission, patient lives alone and was independent with cane or Thamar Holik    Prior Function Prior Level of Function : Independent/Modified Independent             Mobility Comments: Uses cane or Chance Karam or furniture walks. Stays in the house unless family there to assist, RW for MD appointments ADLs Comments: independent with ADL, family  assists with meals, home management and community mobility.     Hand Dominance        Extremity/Trunk Assessment   Upper Extremity Assessment Upper Extremity Assessment: Defer to OT evaluation    Lower Extremity Assessment Lower Extremity Assessment: Generalized weakness    Cervical / Trunk Assessment Cervical / Trunk Assessment: Kyphotic  Communication   Communication: HOH  Cognition Arousal/Alertness: Awake/alert Behavior During Therapy: WFL for tasks assessed/performed Overall Cognitive Status: Within Functional Limits for tasks assessed                                          General Comments General comments (skin integrity, edema, etc.): On 12 L HFNC, spO2 dropped to 85% with bed mobility and increased to 90% after 2-3 minute seated rest break. Able to transfer to chair with drop in spO2 to 86% with 2 minute seated rest break, able to increase to 90-91%    Exercises     Assessment/Plan    PT Assessment Patient needs continued PT services  PT Problem List Decreased strength;Decreased activity tolerance;Decreased balance;Decreased mobility;Cardiopulmonary status limiting activity       PT Treatment Interventions DME instruction;Gait training;Functional mobility training;Therapeutic activities;Therapeutic exercise;Balance training;Patient/family education    PT Goals (Current goals can be found in the Care Plan section)  Acute Rehab PT Goals Patient Stated Goal: to go back to rehab PT Goal Formulation: With patient Time For Goal Achievement: 12/19/22 Potential to Achieve Goals: Good    Frequency Min 2X/week     Co-evaluation PT/OT/SLP Co-Evaluation/Treatment: Yes Reason for Co-Treatment: For patient/therapist safety;To address functional/ADL transfers PT goals addressed during session: Mobility/safety with mobility;Balance         AM-PAC PT "6 Clicks" Mobility  Outcome Measure Help needed turning from your back to your side while in  a flat bed without using bedrails?: A Little Help needed moving from lying on your back to sitting on the side of a flat bed without using bedrails?: A Little Help needed moving to and from a bed to a chair (including a wheelchair)?: A Little Help needed standing up from a chair using your arms (e.g., wheelchair or bedside chair)?: A Little Help needed to walk in hospital room?: A Lot Help needed climbing 3-5 steps with a railing? : Total 6 Click Score: 15    End of Session Equipment Utilized During Treatment: Oxygen Activity Tolerance: Patient tolerated treatment well Patient left: in chair;with call bell/phone within reach Nurse Communication: Mobility status PT Visit Diagnosis: Unsteadiness on feet (R26.81);Muscle weakness (generalized) (M62.81)    Time: 1610-9604 PT Time Calculation (min) (ACUTE ONLY): 16 min   Charges:   PT Evaluation $PT Eval Moderate Complexity: 1 Mod          Josua Ferrebee A. Gilford Rile PT, DPT [facility] - Acute Rehabilitation Services   Darshan Solanki A Ursula Dermody 12/05/2022, 3:45 PM

## 2022-12-05 NOTE — Progress Notes (Signed)
PROGRESS NOTE  [name]    DOB: 1934/02/02, 88 y.o.  TXM:[id number]    Code Status: DNR   DOA: 12/01/2022   LOS: 3   Brief hospital course  [name] is a 88 y.o. female with a PMH significant for  CAD, pulmonary hypertension, permanent atrial fibrillation, type 2 diabetes, hypertension, grade 4 low-grade lung carcinoid/neuroendocrine tumor.  They presented from [facility] to the ED on 12/01/2022 with SOB for several months but acutely worsened x 2 days. She had episodes of nausea and vomiting after eating day prior to arrival.  She was recently discharged to the SNF on 2 to 3 L of oxygen following an admission for acute hypoxic respiratory failure secondary to acute on chronic HFpEF and stage IV low-grade lung carcinoid tumor.  In the ED, it was found that they had tachycardia to 151, blood pressure 127/72, hypoxic at her discharge oxygen level to 85% O2 which improved to normal levels with increasing oxygen up to 6 L.  She had significant increased work of breathing and was started on heated high flow nasal cannula.  Significant findings included normal pCO2. BNP improved compared to prior at 228.  White blood count within normal limits.  Influenza and COVID-19 PCR negative.  Chest x-ray with bibasilar collapse/consolidation due to atelectasis versus pneumonia.  They were initially treated with vancomycin and cefepime for concern of HAP.  12/7- blood transfusion and started low dose morphine for air hunger. Able to wean from [place] 50% FiO2 to regular HFNC at 15L and remained stable >90%.   12/05/22 -stable, frail  Assessment & Plan  Principal Problem:   Acute hypoxic respiratory failure (HCC) Active Problems:   Atrial fibrillation with RVR (HCC)   CAD (coronary artery disease)   DM2 (diabetes mellitus, type 2) (HCC)   Stage 3b chronic kidney disease (CKD) (HCC)   Acute on chronic respiratory failure with hypoxemia (HCC)   HAP (hospital-acquired pneumonia)   Anxiety   Malignant  neoplasm of lung (HCC)   Acute hypoxemic respiratory failure (HCC)   Palliative care encounter   Normocytic anemia   History of stroke   Atrial fibrillation, chronic (HCC)  Acute on chronic hypoxic respiratory failure H/o carcinoid tumor of lung- Respiratory panel negative. Blood culture negative. S/p receiving morphine and blood products showed improvement but remains in critical condition as her WOB is still increased and has low reserve. O2 saturations stable on 15L Hfnc. She may have minor improvement with time but I do not forsee her having significant improvement in respiratory status given her progress thus far. She is DNR so would not intubate if she worsens.  Palliative met with patient and her son and no changes made -- continue to wean oxygen as able.  -- patient received vacnomycin and cefepime  - continue cefepime and adjust length based on clinical improvement -Palliative consulted, appreciate recs -Continue breathing treatments -Continue steroids PO at lower dose - PT/OT   Atrial fibrillation with RVR CAD -- in the setting of hypoxia and underlying illness -- resume beta-blockers and diltiazem (increased to 158m daily) -- on eliquis - stopping statin given prognosis <10 years  Normocytic anemia- hgb 10.0 today s/p 1 unit pRBCs. No signs of active bleeding. She remains on eliquis for stroke prevention with afib  - repeat CBC am   Type II diabetes- poorly controlled given steroid use. Dose decrease so will likely improve. -- sliding scale insulin - Increase long-acting dose to cover for hyperglycemia with steroids  CKD 3B  hyponatremia- Na+ 130 --renal function stable, continue to monitor  Hypothyroidism - continue home med  Body mass index is 24.11 kg/m.  VTE ppx: apixaban (ELIQUIS) tablet 2.5 mg Start: 12/01/22 2200 apixaban (ELIQUIS) tablet 2.5 mg   Diet:     Diet   DIET DYS 3 Room service appropriate? Yes; Fluid consistency: Thin    Consultants: Palliative  Subjective 12/05/22    Pt reports feeling alright without complaints. Again continues to express fear about dying and continues to ask me about her prognosis for getting better and asks me if she is going to die. I have been describing the seriousness of her condition and poor prognosis and she states understanding and asks me to pray for her.   Objective   Vitals:   12/05/22 0247 12/05/22 0512 12/05/22 0515 12/05/22 0731  BP:  116/78  125/82  Pulse:  (!) 103  (!) 103  Resp:  20  19  Temp:  98.7 F (37.1 C)  98.3 F (36.8 C)  TempSrc:  Oral    SpO2: 94% 94%  95%  Weight:   59.8 kg   Height:        Intake/Output Summary (Last 24 hours) at 12/05/2022 0750 Last data filed at 12/05/2022 0123 Gross per 24 hour  Intake 2776.33 ml  Output 2150 ml  Net 626.33 ml    Filed Weights   12/04/22 0036 12/04/22 0500 12/05/22 0515  Weight: 61.4 kg 61.4 kg 59.8 kg     Physical Exam:  General: awake, alert, frail, anxious HEENT: atraumatic, clear conjunctiva, anicteric sclera, MMM, hard of hearing Respiratory: Increased work of breathing, decreased lung sounds throughout Cardiovascular: Delayed capillary refill, regular rhythm, Nervous: A&O x3. no gross focal neurologic deficits, normal speech Extremities: moves all equally, no edema, low tone Skin: dry, intact, normal temperature, normal color. No rashes, lesions or ulcers on exposed skin Psychiatry: Anxious mood, congruent affect  Labs   I have personally reviewed the following labs and imaging studies CBC    Component Value Date/Time   WBC 6.9 12/04/2022 0500   RBC 2.77 (L) 12/04/2022 0500   HGB 9.4 (L) 12/04/2022 2048   HGB 11.7 09/17/2021 1316   HCT 28.9 (L) 12/04/2022 2048   HCT 36.7 09/17/2021 1316   PLT 170 12/04/2022 0500   PLT 128 (L) 09/17/2021 1316   MCV 87.0 12/04/2022 0500   MCV 86 09/17/2021 1316   MCH 28.2 12/04/2022 0500   MCHC 32.4 12/04/2022 0500   RDW 14.6 12/04/2022 0500    RDW 16.0 (H) 09/17/2021 1316   LYMPHSABS 1.0 12/02/2022 0433   MONOABS 1.2 (H) 12/02/2022 0433   EOSABS 0.0 12/02/2022 0433   BASOSABS 0.0 12/02/2022 0433      Latest Ref Rng & Units 12/04/2022    5:00 AM 12/02/2022    4:33 AM 12/01/2022    3:03 PM  BMP  Glucose 70 - 99 mg/dL 395  158  210   BUN 8 - 23 mg/dL 49  30  37   Creatinine 0.44 - 1.00 mg/dL 1.62  1.33  1.41   Sodium 135 - 145 mmol/L 130  135  135   Potassium 3.5 - 5.1 mmol/L 3.9  4.3  4.5   Chloride 98 - 111 mmol/L 94  102  98   CO2 22 - 32 mmol/L _0 Calcium 8.9 - 10.3 mg/dL 8.3  8.6  9.0    Disposition Plan & Communication  Patient  status: Inpatient  Admitted From: SNF Planned disposition location: Skilled nursing facility Anticipated discharge date: 12/10 pending clinical improvement  Family Communication: LVM for son, steven   Author: Richarda Osmond, DO [facility] 12/05/2022, 7:50 AM   Available by Epic secure chat 7AM-7PM. If 7PM-7AM, please contact night-coverage.  [facility] contact information found on [email or web address].

## 2022-12-05 NOTE — Progress Notes (Signed)
Made Dr. Ouida Sills aware, finger stick reading blood sugar as 505, put in for stat glucose via lab to verify reading per order

## 2022-12-05 NOTE — Evaluation (Signed)
Occupational Therapy Evaluation Patient Details Name: [name] MRN: [id number] DOB: 02-08-1934 Today's Date: 12/05/2022   History of Present Illness Pt is an 88 year old female admitted with   Acute on chronic hypoxic respiratory failure H/o carcinoid tumor of lung; PMH significant for PMH significant for  CAD, pulmonary hypertension, permanent atrial fibrillation, type 2 diabetes, hypertension, grade 4 low-grade lung carcinoid/neuroendocrine tumor   Clinical Impression   Chart reviewed, nurse cleared pt for participation in OT evaluation. Co tx completed with PT on this date. PTA pt has been at rehab with hopes to return, was MOD I-I in ADL/IADL prior to previous admissions. Pt presents with deficits in strength, endurance, activity tolerance affecting safe and optimal ADL completion. Recommend discharge to STR to address functional deficits. Of note, pt on 12 L via HFNC throughout, down to 86% with bed mobility, recovers within 2-3 minutes. HR up to 125 with mobility. OT will continue to follow acutely.     Recommendations for follow up therapy are one component of a multi-disciplinary discharge planning process, led by the attending physician.  Recommendations may be updated based on patient status, additional functional criteria and insurance authorization.   Follow Up Recommendations  Skilled nursing-short term rehab (<3 hours/day)     Assistance Recommended at Discharge Frequent or constant Supervision/Assistance  Patient can return home with the following A little help with walking and/or transfers;A little help with bathing/dressing/bathroom;Assistance with cooking/housework;Direct supervision/assist for medications management    Functional Status Assessment     Equipment Recommendations  Other (comment) (defer)    Recommendations for Other Services       Precautions / Restrictions Precautions Precautions: Fall Precaution Comments: watch O2 and  HR Restrictions Weight Bearing Restrictions: No      Mobility Bed Mobility Overal bed mobility: Needs Assistance Bed Mobility: Supine to Sit     Supine to sit: Min guard, HOB elevated          Transfers Overall transfer level: Needs assistance Equipment used: Rolling walker (2 wheels) Transfers: Sit to/from Stand, Bed to chair/wheelchair/BSC Sit to Stand: Min guard     Step pivot transfers: Min guard            Balance Overall balance assessment: Needs assistance Sitting-balance support: Feet supported Sitting balance-Leahy Scale: Fair     Standing balance support: Bilateral upper extremity supported, During functional activity Standing balance-Leahy Scale: Fair                             ADL either performed or assessed with clinical judgement   ADL Overall ADL's : Needs assistance/impaired                                       General ADL Comments: SET UP for grooming tasks, SET UP for LB dressing bed level     Vision Patient Visual Report: No change from baseline       Perception     Praxis      Pertinent Vitals/Pain Pain Assessment Pain Assessment: No/denies pain     Hand Dominance     Extremity/Trunk Assessment Upper Extremity Assessment Upper Extremity Assessment: Generalized weakness   Lower Extremity Assessment Lower Extremity Assessment: Generalized weakness   Cervical / Trunk Assessment Cervical / Trunk Assessment: Kyphotic   Communication Communication Communication: HOH   Cognition Arousal/Alertness: Awake/alert Behavior During  Therapy: WFL for tasks assessed/performed Overall Cognitive Status: Within Functional Limits for tasks assessed                                       General Comments  on 12L via HFNC, spo2 85% from supine>sit, recovered after approx 3 minute seated rest break, short amb transfer to bedside chair with spo2 downt o 86%, above 90% with approx 2 minute  seated rest break; HR up to 125 with mobilty, resting between 100-110    Exercises Other Exercises Other Exercises: edu re: role of OT, role of rehab, discharge recommendations, energy conservation techniques   Shoulder Instructions      Home Living Family/patient expects to be discharged to:: Skilled nursing facility                                 Additional Comments: per chart, pt lives alone, was MOD I in ADL/IADL; from rehab with goal to return to rehab      Prior Functioning/Environment Prior Level of Function : Independent/Modified Independent             Mobility Comments: amb with RW at rehab per pt report ADLs Comments: assist for ADL/IADL at rehab, was previously MOD I prior to previous admissions        OT Problem List: Decreased activity tolerance;Decreased strength      OT Treatment/Interventions: Self-care/ADL training;Patient/family education;Therapeutic exercise;Balance training;Energy conservation;Therapeutic activities;DME and/or AE instruction    OT Goals(Current goals can be found in the care plan section) Acute Rehab OT Goals Patient Stated Goal: return to rehab OT Goal Formulation: With patient Time For Goal Achievement: 12/19/22 Potential to Achieve Goals: Good ADL Goals Pt Will Perform Grooming: with supervision;sitting Pt Will Perform Lower Body Dressing: with supervision Pt Will Transfer to Toilet: with supervision Pt Will Perform Toileting - Clothing Manipulation and hygiene: with supervision  OT Frequency: Min 2X/week    Co-evaluation PT/OT/SLP Co-Evaluation/Treatment: Yes Reason for Co-Treatment: For patient/therapist safety;To address functional/ADL transfers PT goals addressed during session: Mobility/safety with mobility;Balance OT goals addressed during session: ADL's and self-care      AM-PAC OT "6 Clicks" Daily Activity     Outcome Measure Help from another person eating meals?: None Help from another person  taking care of personal grooming?: None Help from another person toileting, which includes using toliet, bedpan, or urinal?: A Little Help from another person bathing (including washing, rinsing, drying)?: A Little Help from another person to put on and taking off regular upper body clothing?: None Help from another person to put on and taking off regular lower body clothing?: None 6 Click Score: 22   End of Session Equipment Utilized During Treatment: Rolling walker (2 wheels);Oxygen Nurse Communication: Mobility status  Activity Tolerance: Patient tolerated treatment well Patient left: in chair;with call bell/phone within reach  OT Visit Diagnosis: Unsteadiness on feet (R26.81)                Time: 2671-2458 OT Time Calculation (min): 16 min Charges:  OT General Charges $OT Visit: 1 Visit OT Evaluation $OT Eval Low Complexity: 1 Low  Shanon Payor, OTD OTR/L  12/05/22, 4:24 PM

## 2022-12-06 DIAGNOSIS — Z515 Encounter for palliative care: Secondary | ICD-10-CM | POA: Diagnosis not present

## 2022-12-06 DIAGNOSIS — I10 Essential (primary) hypertension: Secondary | ICD-10-CM | POA: Diagnosis not present

## 2022-12-06 DIAGNOSIS — E038 Other specified hypothyroidism: Secondary | ICD-10-CM | POA: Diagnosis not present

## 2022-12-06 DIAGNOSIS — J9601 Acute respiratory failure with hypoxia: Secondary | ICD-10-CM | POA: Diagnosis not present

## 2022-12-06 LAB — BASIC METABOLIC PANEL
Anion gap: 11 (ref 5–15)
BUN: 70 mg/dL — ABNORMAL HIGH (ref 8–23)
CO2: 28 mmol/L (ref 22–32)
Calcium: 8.3 mg/dL — ABNORMAL LOW (ref 8.9–10.3)
Chloride: 91 mmol/L — ABNORMAL LOW (ref 98–111)
Creatinine, Ser: 1.99 mg/dL — ABNORMAL HIGH (ref 0.44–1.00)
GFR, Estimated: 24 mL/min — ABNORMAL LOW (ref >60)
Glucose, Bld: 466 mg/dL — ABNORMAL HIGH (ref 70–99)
Potassium: 4.2 mmol/L (ref 3.5–5.1)
Sodium: 130 mmol/L — ABNORMAL LOW (ref 135–145)

## 2022-12-06 LAB — CBC
HCT: 31.4 % — ABNORMAL LOW (ref 36.0–46.0)
Hemoglobin: 10 g/dL — ABNORMAL LOW (ref 12.0–15.0)
MCH: 27.2 pg (ref 26.0–34.0)
MCHC: 31.8 g/dL (ref 30.0–36.0)
MCV: 85.3 fL (ref 80.0–100.0)
Platelets: 156 10*3/uL (ref 150–400)
RBC: 3.68 MIL/uL — ABNORMAL LOW (ref 3.87–5.11)
RDW: 14.8 % (ref 11.5–15.5)
WBC: 8.5 10*3/uL (ref 4.0–10.5)
nRBC: 0 % (ref 0.0–0.2)

## 2022-12-06 LAB — BPAM RBC
Blood Product Expiration Date: 202401082359
ISSUE DATE / TIME: 202312071557
Unit Type and Rh: 5100

## 2022-12-06 LAB — GLUCOSE, CAPILLARY
Glucose-Capillary: 290 mg/dL — ABNORMAL HIGH (ref 70–99)
Glucose-Capillary: 407 mg/dL — ABNORMAL HIGH (ref 70–99)
Glucose-Capillary: 215 mg/dL — ABNORMAL HIGH (ref 70–99)

## 2022-12-06 LAB — TYPE AND SCREEN
ABO/RH(D): O POS
Antibody Screen: NEGATIVE
Unit division: 0

## 2022-12-06 NOTE — TOC Progression Note (Signed)
Transition of Care [facility]) - Progression Note    Patient Details  Name: [name] MRN: [id number] Date of Birth: Apr 02, 1934  [facility] CM/SW Contact  Harriet Masson, RN Phone Number: [phone] 12/06/2022, 10:14 AM  Clinical Narrative:    [facility] RN sent evaluations to [facility] for SNF placement.  TOC will continue to follow.   Expected Discharge Plan: [place] Barriers to Discharge: Continued Medical Work up  Expected Discharge Plan and Services Expected Discharge Plan: [place] Acute Care Choice: Resumption of Svcs/PTA Provider Living arrangements for the past 2 months: Single Family Home                                       Social Determinants of Health (SDOH) Interventions    Readmission Risk Interventions    12/05/2022    2:46 PM  Readmission Risk Prevention Plan  PCP or Specialist appointment within 3-5 days of discharge Complete  SW Recovery Care/Counseling Consult Complete  Palliative Care Screening Complete  Skilled Nursing Facility Complete

## 2022-12-06 NOTE — Progress Notes (Signed)
PROGRESS NOTE  SHENG PRITZ    DOB: 1934-07-05, 88 y.o.  FUX:[id number]    Code Status: DNR   DOA: 12/01/2022   LOS: 4   Brief hospital course  [name] is a 88 y.o. female with a PMH significant for  CAD, pulmonary hypertension, permanent atrial fibrillation, type 2 diabetes, hypertension, grade 4 low-grade lung carcinoid/neuroendocrine tumor.  They presented from [facility] to the ED on 12/01/2022 with SOB for several months but acutely worsened x 2 days. She had episodes of nausea and vomiting after eating day prior to arrival.  She was recently discharged to the SNF on 2 to 3 L of oxygen following an admission for acute hypoxic respiratory failure secondary to acute on chronic HFpEF and stage IV low-grade lung carcinoid tumor.  In the ED, it was found that they had tachycardia to 151, blood pressure 127/72, hypoxic at her discharge oxygen level to 85% O2 which improved to normal levels with increasing oxygen up to 6 L.  She had significant increased work of breathing and was started on heated high flow nasal cannula.  Significant findings included normal pCO2. BNP improved compared to prior at 228.  White blood count within normal limits.  Influenza and COVID-19 PCR negative.  Chest x-ray with bibasilar collapse/consolidation due to atelectasis versus pneumonia.  They were initially treated with vancomycin and cefepime for concern of HAP.  12/7- blood transfusion and started low dose morphine for air hunger. Able to wean from [place] 50% FiO2 to regular HFNC at 15L and remained stable >90%.   12/06/22 -stable, frail  Assessment & Plan  Principal Problem:   Acute hypoxic respiratory failure (HCC) Active Problems:   Atrial fibrillation with RVR (HCC)   CAD (coronary artery disease)   DM2 (diabetes mellitus, type 2) (HCC)   Stage 3b chronic kidney disease (CKD) (HCC)   Acute on chronic respiratory failure with hypoxemia (HCC)   HAP (hospital-acquired pneumonia)   Anxiety   Malignant  neoplasm of lung (HCC)   Acute hypoxemic respiratory failure (HCC)   Palliative care encounter   Normocytic anemia   History of stroke   Atrial fibrillation, chronic (HCC)  Acute on chronic hypoxic respiratory failure H/o carcinoid tumor of lung- Respiratory panel negative. Blood culture negative. S/p receiving morphine and blood products showed improvement but remains in critical condition as her WOB is still increased and has low reserve. O2 saturations stable on 12L Hfnc. She may have minor improvement with time but I do not forsee her having significant improvement in respiratory status given her progress thus far. She is DNR so would not intubate if she worsens.  Palliative met with patient and her son and no changes made -- continue to wean oxygen as able.  -- patient received vacnomycin and cefepime  - continue cefepime and adjust length based on clinical improvement -Palliative consulted, appreciate recs -Continue breathing treatments -Continue steroids PO at lower dose - PT/OT   Atrial fibrillation with RVR CAD- BB held today for borderline low BP -- in the setting of hypoxia and underlying illness -- resume beta-blockers and diltiazem (increased to 174m daily) -- on eliquis - stopping statin given prognosis <10 years  Normocytic anemia- hgb 10.0 again today s/p 1 unit pRBCs. No signs of active bleeding. She remains on eliquis for stroke prevention with afib  - repeat CBC am   Type II diabetes- poorly controlled given steroid use. Dose decrease so will likely improve. -- sliding scale insulin - Increase long-acting dose  to cover for hyperglycemia with steroids   CKD 3B  hyponatremia- Na+ 130 and stable. Artificially low from hyperglycemia --renal function stable, continue to monitor  Hypothyroidism - continue home med  Body mass index is 23.51 kg/m.  VTE ppx: apixaban (ELIQUIS) tablet 2.5 mg Start: 12/01/22 2200 apixaban (ELIQUIS) tablet 2.5 mg   Diet:      Diet   DIET DYS 3 Room service appropriate? Yes; Fluid consistency: Thin   Consultants: Palliative  Subjective 12/06/22    Pt reports feeling alright without complaints. Getting breathing treatment during encounter but says she feels better.    Objective   Vitals:   12/06/22 0002 12/06/22 0300 12/06/22 0438 12/06/22 0441  BP: 110/67   115/75  Pulse: (!) 106   99  Resp: 20   (!) 22  Temp: 98.7 F (37.1 C)   97.7 F (36.5 C)  TempSrc:      SpO2: 94% 93%  99%  Weight:   58.3 kg   Height:        Intake/Output Summary (Last 24 hours) at 12/06/2022 0738 Last data filed at 12/06/2022 0438 Gross per 24 hour  Intake 1057.06 ml  Output 3350 ml  Net -2292.94 ml    Filed Weights   12/04/22 0500 12/05/22 0515 12/06/22 0438  Weight: 61.4 kg 59.8 kg 58.3 kg     Physical Exam:  General: awake, alert, frail, anxious HEENT: atraumatic, clear conjunctiva, anicteric sclera, MMM, hard of hearing Respiratory: Increased work of breathing, decreased lung sounds throughout Cardiovascular: Delayed capillary refill, regular rhythm, Nervous: A&O x3. no gross focal neurologic deficits, normal speech Extremities: moves all equally, no edema, low tone Skin: dry, intact, normal temperature, normal color. No rashes, lesions or ulcers on exposed skin Psychiatry: Anxious mood, congruent affect  Labs   I have personally reviewed the following labs and imaging studies CBC    Component Value Date/Time   WBC 8.5 12/06/2022 0423   RBC 3.68 (L) 12/06/2022 0423   HGB 10.0 (L) 12/06/2022 0423   HGB 11.7 09/17/2021 1316   HCT 31.4 (L) 12/06/2022 0423   HCT 36.7 09/17/2021 1316   PLT 156 12/06/2022 0423   PLT 128 (L) 09/17/2021 1316   MCV 85.3 12/06/2022 0423   MCV 86 09/17/2021 1316   MCH 27.2 12/06/2022 0423   MCHC 31.8 12/06/2022 0423   RDW 14.8 12/06/2022 0423   RDW 16.0 (H) 09/17/2021 1316   LYMPHSABS 1.0 12/02/2022 0433   MONOABS 1.2 (H) 12/02/2022 0433   EOSABS 0.0 12/02/2022 0433    BASOSABS 0.0 12/02/2022 0433      Latest Ref Rng & Units 12/06/2022    4:23 AM 12/05/2022    7:42 AM 12/04/2022    5:00 AM  BMP  Glucose 70 - 99 mg/dL 466  547  395   BUN 8 - 23 mg/dL 70   49   Creatinine 0.44 - 1.00 mg/dL 1.99   1.62   Sodium 135 - 145 mmol/L 130   130   Potassium 3.5 - 5.1 mmol/L 4.2   3.9   Chloride 98 - 111 mmol/L 91   94   CO2 22 - 32 mmol/L 28   26   Calcium 8.9 - 10.3 mg/dL 8.3   8.3    Disposition Plan & Communication  Patient status: Inpatient  Admitted From: SNF Planned disposition location: Skilled nursing facility Anticipated discharge date: 12/12 pending clinical improvement and SNF availability  Family Communication: LVM for son, Psychologist, prison and probation services:  Richarda Osmond, DO [facility] 12/06/2022, 7:38 AM   Available by Epic secure chat 7AM-7PM. If 7PM-7AM, please contact night-coverage.  TR[facility] contact information found on [email or web address].

## 2022-12-07 DIAGNOSIS — I482 Chronic atrial fibrillation, unspecified: Secondary | ICD-10-CM | POA: Diagnosis not present

## 2022-12-07 DIAGNOSIS — E119 Type 2 diabetes mellitus without complications: Secondary | ICD-10-CM

## 2022-12-07 DIAGNOSIS — E038 Other specified hypothyroidism: Secondary | ICD-10-CM | POA: Diagnosis not present

## 2022-12-07 DIAGNOSIS — Z794 Long term (current) use of insulin: Secondary | ICD-10-CM

## 2022-12-07 DIAGNOSIS — J9601 Acute respiratory failure with hypoxia: Secondary | ICD-10-CM | POA: Diagnosis not present

## 2022-12-07 LAB — CBC
HCT: 32.6 % — ABNORMAL LOW (ref 36.0–46.0)
Hemoglobin: 10.5 g/dL — ABNORMAL LOW (ref 12.0–15.0)
MCH: 27.1 pg (ref 26.0–34.0)
MCHC: 32.2 g/dL (ref 30.0–36.0)
MCV: 84.2 fL (ref 80.0–100.0)
Platelets: 147 10*3/uL — ABNORMAL LOW (ref 150–400)
RBC: 3.87 MIL/uL (ref 3.87–5.11)
RDW: 14.4 % (ref 11.5–15.5)
WBC: 8.2 10*3/uL (ref 4.0–10.5)
nRBC: 0 % (ref 0.0–0.2)

## 2022-12-07 LAB — CULTURE, BLOOD (ROUTINE X 2)
Culture: NO GROWTH
Culture: NO GROWTH
Special Requests: ADEQUATE
Special Requests: ADEQUATE

## 2022-12-07 LAB — BASIC METABOLIC PANEL
Anion gap: 8 (ref 5–15)
BUN: 72 mg/dL — ABNORMAL HIGH (ref 8–23)
CO2: 32 mmol/L (ref 22–32)
Calcium: 8.6 mg/dL — ABNORMAL LOW (ref 8.9–10.3)
Chloride: 93 mmol/L — ABNORMAL LOW (ref 98–111)
Creatinine, Ser: 1.76 mg/dL — ABNORMAL HIGH (ref 0.44–1.00)
GFR, Estimated: 28 mL/min — ABNORMAL LOW (ref >60)
Glucose, Bld: 240 mg/dL — ABNORMAL HIGH (ref 70–99)
Potassium: 3.8 mmol/L (ref 3.5–5.1)
Sodium: 133 mmol/L — ABNORMAL LOW (ref 135–145)

## 2022-12-07 LAB — GLUCOSE, CAPILLARY
Glucose-Capillary: 225 mg/dL — ABNORMAL HIGH (ref 70–99)
Glucose-Capillary: 352 mg/dL — ABNORMAL HIGH (ref 70–99)
Glucose-Capillary: 229 mg/dL — ABNORMAL HIGH (ref 70–99)
Glucose-Capillary: 240 mg/dL — ABNORMAL HIGH (ref 70–99)

## 2022-12-07 MED ORDER — METOPROLOL TARTRATE 25 MG PO TABS
12.5000 mg | ORAL_TABLET | Freq: Two times a day (BID) | ORAL | Status: DC
  Administered 2022-12-07 – 2022-12-09 (×4): 12.5 mg via ORAL
  Filled 2022-12-07 (×4): qty 1

## 2022-12-07 MED ORDER — DILTIAZEM HCL ER COATED BEADS 120 MG PO CP24
120.0000 mg | ORAL_CAPSULE | Freq: Every day | ORAL | Status: DC
  Administered 2022-12-08 – 2022-12-09 (×2): 120 mg via ORAL
  Filled 2022-12-07 (×2): qty 1

## 2022-12-07 NOTE — TOC Progression Note (Signed)
Transition of Care [facility]) - Progression Note    Patient Details  Name: [name] MRN: [id number] Date of Birth: [birth year]  Transition of Care [facility] CM/SW Contact  Zigmund Daniel Dorian Pod, RN Phone Number:[phone] 12/07/2022, 11:41 AM  Clinical Narrative:    Spoke with son Remo Lipps) who was at bedside concerning discharge plans as pt will return to [facility] (SNF level of care). TOC RN has sent recent therapy notes to the SNF concerning pt's progress. No other needs or requested presented at this time.  TOC team will continue to monitoring and address any ongoing needs related to discharge planning needs.   Expected Discharge Plan: [place] Barriers to Discharge: Continued Medical Work up  Expected Discharge Plan and Services Expected Discharge Plan: [place] Acute Care Choice: Resumption of Svcs/PTA Provider Living arrangements for the past 2 months: Single Family Home                                       Social Determinants of Health (SDOH) Interventions    Readmission Risk Interventions    12/05/2022    2:46 PM  Readmission Risk Prevention Plan  PCP or Specialist appointment within 3-5 days of discharge Complete  SW Recovery Care/Counseling Consult Complete  Palliative Care Screening Complete  Skilled Nursing Facility Complete

## 2022-12-07 NOTE — Progress Notes (Signed)
Mobility Specialist - Progress Note    12/07/22 0959  Mobility  Activity Stood at bedside;Transferred to/from [facility]  Level of Assistance Contact guard assist, steadying assist  Assistive Device Front wheel walker  Distance Ambulated (ft) 5 ft  Activity Response Tolerated well  Mobility Referral Yes  $Mobility charge 1 Mobility   Pt resting in bed on 6L upon entry. Pt STS and ambulates/ 3 step pivot transfer to [facility] Pt HR jumped to 125. RN Notified. Pt returned to bed and left with needs in reach. Pt bed alarm activated.   Loma Sender Mobility Specialist 12/07/22, 10:07 AM

## 2022-12-07 NOTE — Progress Notes (Signed)
PROGRESS NOTE  [name]    DOB: 1934/12/04, 88 y.o.  RAQ:[id number]    Code Status: DNR   DOA: 12/01/2022   LOS: 5   Brief hospital course  [name] is a 88 y.o. female with a PMH significant for  CAD, pulmonary hypertension, permanent atrial fibrillation, type 2 diabetes, hypertension, grade 4 low-grade lung carcinoid/neuroendocrine tumor.  They presented from [facility] to the ED on 12/01/2022 with SOB for several months but acutely worsened x 2 days. She had episodes of nausea and vomiting after eating day prior to arrival.  She was recently discharged to the SNF on 2 to 3 L of oxygen following an admission for acute hypoxic respiratory failure secondary to acute on chronic HFpEF and stage IV low-grade lung carcinoid tumor.  In the ED, it was found that they had tachycardia to 151, blood pressure 127/72, hypoxic at her discharge oxygen level to 85% O2 which improved to normal levels with increasing oxygen up to 6 L.  She had significant increased work of breathing and was started on heated high flow nasal cannula.  Significant findings included normal pCO2. BNP improved compared to prior at 228.  White blood count within normal limits.  Influenza and COVID-19 PCR negative.  Chest x-ray with bibasilar collapse/consolidation due to atelectasis versus pneumonia.  They were initially treated with vancomycin and cefepime for concern of HAP.  12/7- blood transfusion and started low dose morphine for air hunger. Able to wean from [place] 50% FiO2 to regular HFNC at 15L and remained stable >90%.   12/07/22 -stable, frail  Assessment & Plan  Principal Problem:   Acute hypoxic respiratory failure (HCC) Active Problems:   Atrial fibrillation with RVR (HCC)   CAD (coronary artery disease)   DM2 (diabetes mellitus, type 2) (HCC)   Stage 3b chronic kidney disease (CKD) (HCC)   Acute on chronic respiratory failure with hypoxemia (HCC)   HAP (hospital-acquired pneumonia)   Anxiety   Malignant  neoplasm of lung (HCC)   Acute hypoxemic respiratory failure (HCC)   Palliative care encounter   Normocytic anemia   History of stroke   Atrial fibrillation, chronic (HCC)  Acute on chronic hypoxic respiratory failure H/o carcinoid tumor of lung- Respiratory panel negative. Blood culture negative. S/p receiving morphine and blood products showed improvement but remains in critical condition as her WOB is still increased and has low reserve. Palliative met with patient and her son and no changes made She has made significant improvement in O2 dependence and remains with significant work of breathing.  -- continue to wean oxygen as able.  -- patient received vacnomycin and cefepime  - continue cefepime and adjust length based on clinical improvement. Can stop at 7 days in setting of significant improvement -Palliative consulted, appreciate recs -Continue breathing treatments -Continue steroids PO at lower dose - PT/OT - TOC engaged for SNF placement. Will be able to dc when O2 dependence reliably <5Lnc   Atrial fibrillation with RVR  CAD- in the setting of hypoxia and underlying illness. Now rate controlled.  -- resume metoprolol and diltiazem at lower dose for borderline low BPs -- continue eliquis - stopping statin given prognosis <10 years  Normocytic anemia- hgb stable around 10.0. s/p 1 unit pRBCs 12/7. No signs of active bleeding. She remains on eliquis for stroke prevention with afib  - repeat CBC am   Type II diabetes- poorly controlled given steroid use but improving. Steroid dose decreased so will likely improve. - sliding scale  insulin - Increase long-acting dose to cover for hyperglycemia with steroids and will adjust down PRN   CKD 3B  hyponatremia- Na+ 133 and stable. Artificially low from hyperglycemia --renal function stable, continue to monitor  Hypothyroidism - continue home med  Body mass index is 22.5 kg/m.  VTE ppx: apixaban (ELIQUIS) tablet 2.5 mg Start:  12/01/22 2200 apixaban (ELIQUIS) tablet 2.5 mg   Diet:     Diet   DIET DYS 3 Room service appropriate? Yes; Fluid consistency: Thin   Consultants: Palliative  Subjective 12/07/22    Pt reports feeling alright without complaints. No questions or concerns today.   Objective   Vitals:   12/06/22 2348 12/07/22 0153 12/07/22 0401 12/07/22 0500  BP: 112/78  107/69   Pulse: 96  95   Resp: 18  17   Temp: 97.9 F (36.6 C)  97.8 F (36.6 C)   TempSrc: Oral  Oral   SpO2: 99% 97% 98%   Weight:    55.8 kg  Height:        Intake/Output Summary (Last 24 hours) at 12/07/2022 0726 Last data filed at 12/06/2022 1900 Gross per 24 hour  Intake 580 ml  Output 1350 ml  Net -770 ml    Filed Weights   12/05/22 0515 12/06/22 0438 12/07/22 0500  Weight: 59.8 kg 58.3 kg 55.8 kg     Physical Exam:  General: awake, alert, frail, anxious HEENT: atraumatic, clear conjunctiva, anicteric sclera, MMM, hard of hearing Respiratory: Increased work of breathing, decreased lung sounds throughout Cardiovascular: Delayed capillary refill, regular rhythm, Nervous: A&O x3. no gross focal neurologic deficits, normal speech Extremities: moves all equally, no edema, low tone Skin: dry, intact, normal temperature, normal color. No rashes, lesions or ulcers on exposed skin Psychiatry: Anxious mood, congruent affect  Labs   I have personally reviewed the following labs and imaging studies CBC    Component Value Date/Time   WBC 8.2 12/07/2022 0613   RBC 3.87 12/07/2022 0613   HGB 10.5 (L) 12/07/2022 0613   HGB 11.7 09/17/2021 1316   HCT 32.6 (L) 12/07/2022 0613   HCT 36.7 09/17/2021 1316   PLT 147 (L) 12/07/2022 0613   PLT 128 (L) 09/17/2021 1316   MCV 84.2 12/07/2022 0613   MCV 86 09/17/2021 1316   MCH 27.1 12/07/2022 0613   MCHC 32.2 12/07/2022 0613   RDW 14.4 12/07/2022 0613   RDW 16.0 (H) 09/17/2021 1316   LYMPHSABS 1.0 12/02/2022 0433   MONOABS 1.2 (H) 12/02/2022 0433   EOSABS 0.0  12/02/2022 0433   BASOSABS 0.0 12/02/2022 0433      Latest Ref Rng & Units 12/07/2022    6:13 AM 12/06/2022    4:23 AM 12/05/2022    7:42 AM  BMP  Glucose 70 - 99 mg/dL 240  466  547   BUN 8 - 23 mg/dL 72  70    Creatinine 0.44 - 1.00 mg/dL 1.76  1.99    Sodium 135 - 145 mmol/L 133  130    Potassium 3.5 - 5.1 mmol/L 3.8  4.2    Chloride 98 - 111 mmol/L 93  91    CO2 22 - 32 mmol/L 32  28    Calcium 8.9 - 10.3 mg/dL 8.6  8.3     Disposition Plan & Communication  Patient status: Inpatient  Admitted From: SNF Planned disposition location: Skilled nursing facility Anticipated discharge date: 12/12 pending clinical improvement and SNF availability  Family Communication: phone call with son, steven  Author: Richarda Osmond, DO [facility] 12/07/2022, 7:26 AM   Available by Epic secure chat 7AM-7PM. If 7PM-7AM, please contact night-coverage.  TR[facility] contact information found on [email or web address].

## 2022-12-08 DIAGNOSIS — I482 Chronic atrial fibrillation, unspecified: Secondary | ICD-10-CM | POA: Diagnosis not present

## 2022-12-08 DIAGNOSIS — J9601 Acute respiratory failure with hypoxia: Secondary | ICD-10-CM | POA: Diagnosis not present

## 2022-12-08 DIAGNOSIS — E038 Other specified hypothyroidism: Secondary | ICD-10-CM | POA: Diagnosis not present

## 2022-12-08 LAB — CBC
HCT: 35.8 % — ABNORMAL LOW (ref 36.0–46.0)
Hemoglobin: 11.4 g/dL — ABNORMAL LOW (ref 12.0–15.0)
MCH: 27.1 pg (ref 26.0–34.0)
MCHC: 31.8 g/dL (ref 30.0–36.0)
MCV: 85.2 fL (ref 80.0–100.0)
Platelets: 141 10*3/uL — ABNORMAL LOW (ref 150–400)
RBC: 4.2 MIL/uL (ref 3.87–5.11)
RDW: 14.2 % (ref 11.5–15.5)
WBC: 11.1 10*3/uL — ABNORMAL HIGH (ref 4.0–10.5)
nRBC: 0 % (ref 0.0–0.2)

## 2022-12-08 LAB — BASIC METABOLIC PANEL
Anion gap: 12 (ref 5–15)
BUN: 77 mg/dL — ABNORMAL HIGH (ref 8–23)
CO2: 30 mmol/L (ref 22–32)
Calcium: 8.8 mg/dL — ABNORMAL LOW (ref 8.9–10.3)
Chloride: 94 mmol/L — ABNORMAL LOW (ref 98–111)
Creatinine, Ser: 1.79 mg/dL — ABNORMAL HIGH (ref 0.44–1.00)
GFR, Estimated: 27 mL/min — ABNORMAL LOW (ref >60)
Glucose, Bld: 167 mg/dL — ABNORMAL HIGH (ref 70–99)
Potassium: 3.5 mmol/L (ref 3.5–5.1)
Sodium: 136 mmol/L (ref 135–145)

## 2022-12-08 LAB — GLUCOSE, CAPILLARY
Glucose-Capillary: 166 mg/dL — ABNORMAL HIGH (ref 70–99)
Glucose-Capillary: 416 mg/dL — ABNORMAL HIGH (ref 70–99)
Glucose-Capillary: 270 mg/dL — ABNORMAL HIGH (ref 70–99)
Glucose-Capillary: 191 mg/dL — ABNORMAL HIGH (ref 70–99)

## 2022-12-08 MED ORDER — IPRATROPIUM BROMIDE 0.02 % IN SOLN
0.5000 mg | Freq: Two times a day (BID) | RESPIRATORY_TRACT | Status: DC
  Administered 2022-12-08 – 2022-12-09 (×2): 0.5 mg via RESPIRATORY_TRACT
  Filled 2022-12-08 (×2): qty 2.5

## 2022-12-08 MED ORDER — MORPHINE SULFATE (PF) 2 MG/ML IV SOLN: 0.5000 mg | Freq: Three times a day (TID) | INTRAVENOUS | Status: DC | PRN

## 2022-12-08 NOTE — Progress Notes (Signed)
PROGRESS NOTE  [name]    DOB: 12/05/1934, 88 y.o.  RKY:[id number]    Code Status: DNR   DOA: 12/01/2022   LOS: 6   Brief hospital course  Deanna GIBEAULT is a 88 y.o. female with a PMH significant for  CAD, pulmonary hypertension, permanent atrial fibrillation, type 2 diabetes, hypertension, grade 4 low-grade lung carcinoid/neuroendocrine tumor.  They presented from [facility] to the ED on 12/01/2022 with SOB for several months but acutely worsened x 2 days. She had episodes of nausea and vomiting after eating day prior to arrival.  She was recently discharged to the SNF on 2 to 3 L of oxygen following an admission for acute hypoxic respiratory failure secondary to acute on chronic HFpEF and stage IV low-grade lung carcinoid tumor.  In the ED, it was found that they had tachycardia to 151, blood pressure 127/72, hypoxic at her discharge oxygen level to 85% O2 which improved to normal levels with increasing oxygen up to 6 L.  She had significant increased work of breathing and was started on heated high flow nasal cannula.  Significant findings included normal pCO2. BNP improved compared to prior at 228.  White blood count within normal limits.  Influenza and COVID-19 PCR negative.  Chest x-ray with bibasilar collapse/consolidation due to atelectasis versus pneumonia.  They were initially treated with vancomycin and cefepime for concern of HAP.  12/7- blood transfusion and started low dose morphine for air hunger. Able to wean from [place] 50% FiO2 to regular HFNC at 15L and remained stable >90%.   12/08/22 -stable, ready for discharge to SNF when bed available.  Assessment & Plan  Principal Problem:   Acute hypoxic respiratory failure (HCC) Active Problems:   Atrial fibrillation with RVR (HCC)   CAD (coronary artery disease)   DM2 (diabetes mellitus, type 2) (HCC)   Stage 3b chronic kidney disease (CKD) (HCC)   Acute on chronic respiratory failure with hypoxemia (HCC)   HAP  (hospital-acquired pneumonia)   Anxiety   Malignant neoplasm of lung (HCC)   Acute hypoxemic respiratory failure (HCC)   Palliative care encounter   Normocytic anemia   History of stroke   Atrial fibrillation, chronic (HCC)  Acute on chronic hypoxic respiratory failure H/o carcinoid tumor of lung- Respiratory panel negative. Blood culture negative. S/p receiving morphine and blood products showed improvement but remains in critical condition as her WOB is still increased and has low reserve. Palliative met with patient and her son and no changes made She has made significant improvement in O2 dependence and WOB improved now. -- continue to wean oxygen as able.  -- patient received vacnomycin and cefepime  - completed cefepime 7 days -Palliative consulted, appreciate recs -Continue breathing treatments -Continue steroids PO at lower dose - PT/OT - TOC engaged for SNF placement. Will be able to dc when bed available   Atrial fibrillation with RVR  CAD- in the setting of hypoxia and underlying illness. Now rate controlled.  -- resume metoprolol and diltiazem at lower dose for borderline low BPs -- continue eliquis - stopping statin given prognosis <10 years  Normocytic anemia- hgb stable around 10.0. s/p 1 unit pRBCs 12/7. No signs of active bleeding. She remains on eliquis for stroke prevention with afib  - repeat CBC am   Type II diabetes- poorly controlled given steroid use but improving. Steroid dose decreased so will likely improve. - sliding scale insulin - Increase long-acting dose to cover for hyperglycemia with steroids and will adjust  down PRN   CKD 3B  hyponatremia- Na+ 136 and stable. --renal function stable, continue to monitor  Hypothyroidism - continue home med  Body mass index is 22.74 kg/m.  VTE ppx: apixaban (ELIQUIS) tablet 2.5 mg Start: 12/01/22 2200 apixaban (ELIQUIS) tablet 2.5 mg   Diet:     Diet   DIET DYS 3 Room service appropriate? Yes; Fluid  consistency: Thin   Consultants: Palliative  Subjective 12/08/22    Pt reports feeling alright without complaints. Anxiously asks about her prognosis and is worried about dying when she is discharged.  Tried to reassure her and also discussed the seriousness of her condition and poor health.    Objective   Vitals:   12/08/22 0002 12/08/22 0006 12/08/22 0211 12/08/22 0422  BP:  112/61  105/76  Pulse:  88  98  Resp:  (!) 22  (!) 22  Temp:  97.8 F (36.6 C)  (!) 97.5 F (36.4 C)  TempSrc:      SpO2:  98% 97% 99%  Weight: 56.4 kg     Height:        Intake/Output Summary (Last 24 hours) at 12/08/2022 0732 Last data filed at 12/08/2022 0221 Gross per 24 hour  Intake 820 ml  Output 1875 ml  Net -1055 ml    Filed Weights   12/06/22 0438 12/07/22 0500 12/08/22 0002  Weight: 58.3 kg 55.8 kg 56.4 kg     Physical Exam:  General: awake, alert, frail, anxious HEENT: atraumatic, clear conjunctiva, anicteric sclera, MMM, hard of hearing Respiratory: normal work of breathing, decreased lung sounds throughout Cardiovascular: Delayed capillary refill, regular rhythm, Nervous: A&O x3. no gross focal neurologic deficits, normal speech Extremities: moves all equally, no edema, low tone Skin: dry, intact, normal temperature, normal color. No rashes, lesions or ulcers on exposed skin Psychiatry: Anxious mood, congruent affect  Labs   I have personally reviewed the following labs and imaging studies CBC    Component Value Date/Time   WBC 11.1 (H) 12/08/2022 0456   RBC 4.20 12/08/2022 0456   HGB 11.4 (L) 12/08/2022 0456   HGB 11.7 09/17/2021 1316   HCT 35.8 (L) 12/08/2022 0456   HCT 36.7 09/17/2021 1316   PLT 141 (L) 12/08/2022 0456   PLT 128 (L) 09/17/2021 1316   MCV 85.2 12/08/2022 0456   MCV 86 09/17/2021 1316   MCH 27.1 12/08/2022 0456   MCHC 31.8 12/08/2022 0456   RDW 14.2 12/08/2022 0456   RDW 16.0 (H) 09/17/2021 1316   LYMPHSABS 1.0 12/02/2022 0433   MONOABS 1.2 (H)  12/02/2022 0433   EOSABS 0.0 12/02/2022 0433   BASOSABS 0.0 12/02/2022 0433      Latest Ref Rng & Units 12/08/2022    4:56 AM 12/07/2022    6:13 AM 12/06/2022    4:23 AM  BMP  Glucose 70 - 99 mg/dL 167  240  466   BUN 8 - 23 mg/dL 77  72  70   Creatinine 0.44 - 1.00 mg/dL 1.79  1.76  1.99   Sodium 135 - 145 mmol/L 136  133  130   Potassium 3.5 - 5.1 mmol/L 3.5  3.8  4.2   Chloride 98 - 111 mmol/L 94  93  91   CO2 22 - 32 mmol/L 30  32  28   Calcium 8.9 - 10.3 mg/dL 8.8  8.6  8.3    Disposition Plan & Communication  Patient status: Inpatient  Admitted From: SNF Planned disposition location: Skilled nursing  facility Anticipated discharge date: 12/12 pending clinical improvement and SNF availability  Family Communication: phone call with son, steven   Author: Richarda Osmond, DO [facility] 12/08/2022, 7:32 AM   Available by Epic secure chat 7AM-7PM. If 7PM-7AM, please contact night-coverage.  TR[facility] contact information found on [email or web address].

## 2022-12-08 NOTE — Care Management Important Message (Signed)
Important Message  Patient Details  Name: [name] MRN: [id number] Date of Birth: 28-Sep-1934   Medicare Important Message Given:  Yes     [name] 12/08/2022, 11:52 AM

## 2022-12-08 NOTE — Progress Notes (Signed)
Nutrition Follow-up  DOCUMENTATION CODES:   Non-severe (moderate) malnutrition in context of chronic illness  INTERVENTION:   -Continue Ensure Enlive po TID, each supplement provides 350 kcal and 20 grams of protein.  -Continue MVI with minerals daily -Continue Magic cup TID with meals, each supplement provides 290 kcal and 9 grams of protein   NUTRITION DIAGNOSIS:   Severe Malnutrition related to chronic illness (lung carcinoid/ neuroendocrine tumor) as evidenced by moderate fat depletion, severe fat depletion, moderate muscle depletion, severe muscle depletion.  Ongoing  GOAL:   Patient will meet greater than or equal to 90% of their needs  Progressing   MONITOR:   PO intake, Supplement acceptance  REASON FOR ASSESSMENT:   Malnutrition Screening Tool    ASSESSMENT:   Pt with a PMH significant for  CAD, pulmonary hypertension, permanent atrial fibrillation, type 2 diabetes, hypertension, grade 4 low-grade lung carcinoid/neuroendocrine tumor.  Reviewed I/O's: -1.1 L x 24 hours and -5.7 L since admission  UOP: 1.9 L x 24 hours  Spoke with pt, who was sitting in recliner chair at time of visit. Pt consumed 75% of her breakfast. Per pt, her appetite has improved greatly since being admitted. Noted meal completions 50-100%. Pt also drinking Ensure supplements when offered. Pt also likes Magic Cups.   PTA, pt shares that she was at [facility] for rehab (will likely discharge back there tomorrow). Per pt, she was eating well and consuming 3 meals per day.   Discussed importance of good meal and supplement intake to promote healing.    Reviewed wt hx; no wt loss noted over the past 6 months.   Medications reviewed and include cardizem and lasix.   Labs reviewed: CBGS: 166-416 (inpatient orders for glycemic control are 0-20 units insulin aspart TID with meals and 25 units insulin glargine-yfgn dauly).    NUTRITION - FOCUSED PHYSICAL EXAM:  Flowsheet Row Most Recent  Value  Orbital Region Severe depletion  Upper Arm Region Severe depletion  Thoracic and Lumbar Region Moderate depletion  Buccal Region Moderate depletion  Temple Region Severe depletion  Clavicle Bone Region Severe depletion  Clavicle and Acromion Bone Region Severe depletion  Scapular Bone Region Severe depletion  Dorsal Hand Moderate depletion  Patellar Region Severe depletion  Anterior Thigh Region Severe depletion  Posterior Calf Region Severe depletion  Edema (RD Assessment) None  Hair Reviewed  Eyes Reviewed  Mouth Reviewed  Skin Reviewed  Nails Reviewed       Diet Order:   Diet Order             DIET DYS 3 Room service appropriate? Yes; Fluid consistency: Thin  Diet effective now                   EDUCATION NEEDS:   No education needs have been identified at this time  Skin:  Skin Assessment: Reviewed RN Assessment  Last BM:  12/08/22 (type 5)  Height:   Ht Readings from Last 1 Encounters:  12/01/22 5\' 2"  (1.575 m)    Weight:   Wt Readings from Last 1 Encounters:  12/08/22 56.4 kg    Ideal Body Weight:  50 kg  BMI:  Body mass index is 22.74 kg/m.  Estimated Nutritional Needs:   Kcal:  1700-1900  Protein:  85-100 grams  Fluid:  > 1.7 L    Loistine Chance, RD, LDN, [place] Registered Dietitian II Certified Diabetes Care and Education Specialist Please refer to [facility] for RD and/or RD on-call/weekend/after hours pager

## 2022-12-08 NOTE — Plan of Care (Addendum)
Lunch CBG 416 - Dr. Informed.  Verbal order given to just give Max sliding (20U novolog) for now.  Will give and continue to assess.

## 2022-12-08 NOTE — Progress Notes (Signed)
Physical Therapy Treatment Patient Details Name: [name] MRN: [id number] DOB: January 01, 1934 Today's Date: 12/08/2022   History of Present Illness 88 y/o female presented to ED on 12/01/22 for SOB and dyspnea on exertion. Admitted for acute respiratory failure 2/2 PNA. PMH: CAD, pulmonary htn, afib, dm, htn, lung CA, stroke.    PT Comments    Patient progressing towards physical therapy goals. Currently requiring 3L O2 for mobility and able to maintain spO2 95% following activity. Overall, functioning at min guard level with RW but demonstrates decreased activity tolerance requiring rest breaks after transfer and ambulation. Encouraged continued mobility and up for meals in chair, patient verbalized understanding. Continue to recommend SNF for ongoing Physical Therapy.        Recommendations for follow up therapy are one component of a multi-disciplinary discharge planning process, led by the attending physician.  Recommendations may be updated based on patient status, additional functional criteria and insurance authorization.  Follow Up Recommendations  Skilled nursing-short term rehab (<3 hours/day) Can patient physically be transported by private vehicle: Yes   Assistance Recommended at Discharge Frequent or constant Supervision/Assistance  Patient can return home with the following Help with stairs or ramp for entrance;Assistance with cooking/housework;A little help with walking and/or transfers;A little help with bathing/dressing/bathroom;Assist for transportation   Equipment Recommendations  None recommended by PT    Recommendations for Other Services       Precautions / Restrictions Precautions Precautions: Fall Precaution Comments: watch O2 and HR Restrictions Weight Bearing Restrictions: No     Mobility  Bed Mobility Overal bed mobility: Needs Assistance Bed Mobility: Sit to Supine       Sit to supine: Supervision   General bed mobility comments: supervision  for safety    Transfers Overall transfer level: Needs assistance Equipment used: Rolling Domini Vandehei (2 wheels) Transfers: Sit to/from Stand, Bed to chair/wheelchair/BSC Sit to Stand: Min guard   Step pivot transfers: Min guard       General transfer comment: able to transfer to [facility] from recliner with min guard.    Ambulation/Gait Ambulation/Gait assistance: Min guard Gait Distance (Feet): 20 Feet Assistive device: Rolling Yamilex Borgwardt (2 wheels) Gait Pattern/deviations: Step-to pattern, Decreased stride length, Trunk flexed Gait velocity: decreaesd     General Gait Details: trunk flexed throughout. Reports feeling shaky during mobility. Min guard for safety. Able to negotiate around obstacles in the room without assistance. SpO2 95% on 3L O2 with mobility   Stairs             Wheelchair Mobility    Modified Rankin (Stroke Patients Only)       Balance Overall balance assessment: Needs assistance Sitting-balance support: Feet supported Sitting balance-Leahy Scale: Fair     Standing balance support: Bilateral upper extremity supported, During functional activity Standing balance-Leahy Scale: Fair                              Cognition Arousal/Alertness: Awake/alert Behavior During Therapy: WFL for tasks assessed/performed Overall Cognitive Status: Within Functional Limits for tasks assessed                                          Exercises      General Comments General comments (skin integrity, edema, etc.): On 3L O2 throughout session, spO2 95% with mobility. HR up to 128 after mobility  Pertinent Vitals/Pain Pain Assessment Pain Assessment: No/denies pain    Home Living                          Prior Function            PT Goals (current goals can now be found in the care plan section) Acute Rehab PT Goals Patient Stated Goal: to go back to rehab PT Goal Formulation: With patient Time For Goal  Achievement: 12/19/22 Potential to Achieve Goals: Good Progress towards PT goals: Progressing toward goals    Frequency    Min 2X/week      PT Plan Current plan remains appropriate    Co-evaluation              AM-PAC PT "6 Clicks" Mobility   Outcome Measure  Help needed turning from your back to your side while in a flat bed without using bedrails?: A Little Help needed moving from lying on your back to sitting on the side of a flat bed without using bedrails?: A Little Help needed moving to and from a bed to a chair (including a wheelchair)?: A Little Help needed standing up from a chair using your arms (e.g., wheelchair or bedside chair)?: A Little Help needed to walk in hospital room?: A Lot Help needed climbing 3-5 steps with a railing? : Total 6 Click Score: 15    End of Session Equipment Utilized During Treatment: Gait belt;Oxygen Activity Tolerance: Patient tolerated treatment well Patient left: in bed;with call bell/phone within reach;with bed alarm set Nurse Communication: Mobility status PT Visit Diagnosis: Unsteadiness on feet (R26.81);Muscle weakness (generalized) (M62.81)     Time: 5643-3295 PT Time Calculation (min) (ACUTE ONLY): 24 min  Charges:  $Therapeutic Activity: 23-37 mins                     Durward Matranga A. Gilford Rile PT, DPT [facility] - Acute Rehabilitation Services    Lena Gores A Finnley Lewis 12/08/2022, 11:22 AM

## 2022-12-08 NOTE — TOC Progression Note (Signed)
Transition of Care [facility]) - Progression Note    Patient Details  Name: [name] MRN: [id number] Date of Birth: 1934-04-11  Transition of [facility]) CM/SW Contact  Gerilyn Pilgrim, LCSW Phone Number: 12/08/2022, 1:42 PM  Clinical Narrative:   [place] started- still in pending status. Pt from [facility]. Spoke with admissions from [facility], patient can be accepted tomorrow.     Expected Discharge Plan: [place] Barriers to Discharge: Continued Medical Work up  Expected Discharge Plan and Services Expected Discharge Plan: [place] Acute Care Choice: Resumption of Svcs/PTA Provider Living arrangements for the past 2 months: Single Family Home                                       Social Determinants of Health (SDOH) Interventions    Readmission Risk Interventions    12/05/2022    2:46 PM  Readmission Risk Prevention Plan  PCP or Specialist appointment within 3-5 days of discharge Complete  SW Recovery Care/Counseling Consult Complete  Palliative Care Screening Complete  Skilled Nursing Facility Complete

## 2022-12-09 ENCOUNTER — Encounter: Payer: Self-pay | Admitting: Internal Medicine

## 2022-12-09 DIAGNOSIS — I5041 Acute combined systolic (congestive) and diastolic (congestive) heart failure: Secondary | ICD-10-CM | POA: Diagnosis not present

## 2022-12-09 DIAGNOSIS — R0602 Shortness of breath: Secondary | ICD-10-CM | POA: Diagnosis not present

## 2022-12-09 DIAGNOSIS — E871 Hypo-osmolality and hyponatremia: Secondary | ICD-10-CM | POA: Diagnosis not present

## 2022-12-09 DIAGNOSIS — J9690 Respiratory failure, unspecified, unspecified whether with hypoxia or hypercapnia: Secondary | ICD-10-CM | POA: Diagnosis not present

## 2022-12-09 DIAGNOSIS — Z9981 Dependence on supplemental oxygen: Secondary | ICD-10-CM | POA: Diagnosis not present

## 2022-12-09 DIAGNOSIS — R1312 Dysphagia, oropharyngeal phase: Secondary | ICD-10-CM | POA: Diagnosis not present

## 2022-12-09 DIAGNOSIS — E440 Moderate protein-calorie malnutrition: Secondary | ICD-10-CM | POA: Insufficient documentation

## 2022-12-09 DIAGNOSIS — Z7401 Bed confinement status: Secondary | ICD-10-CM | POA: Diagnosis not present

## 2022-12-09 DIAGNOSIS — C7A8 Other malignant neuroendocrine tumors: Secondary | ICD-10-CM | POA: Diagnosis not present

## 2022-12-09 DIAGNOSIS — E1122 Type 2 diabetes mellitus with diabetic chronic kidney disease: Secondary | ICD-10-CM | POA: Diagnosis not present

## 2022-12-09 DIAGNOSIS — R54 Age-related physical debility: Secondary | ICD-10-CM | POA: Diagnosis not present

## 2022-12-09 DIAGNOSIS — N1832 Chronic kidney disease, stage 3b: Secondary | ICD-10-CM | POA: Diagnosis not present

## 2022-12-09 DIAGNOSIS — I480 Paroxysmal atrial fibrillation: Secondary | ICD-10-CM | POA: Diagnosis not present

## 2022-12-09 DIAGNOSIS — N183 Chronic kidney disease, stage 3 unspecified: Secondary | ICD-10-CM | POA: Diagnosis not present

## 2022-12-09 DIAGNOSIS — E538 Deficiency of other specified B group vitamins: Secondary | ICD-10-CM | POA: Diagnosis not present

## 2022-12-09 DIAGNOSIS — I10 Essential (primary) hypertension: Secondary | ICD-10-CM | POA: Diagnosis not present

## 2022-12-09 DIAGNOSIS — J9601 Acute respiratory failure with hypoxia: Secondary | ICD-10-CM | POA: Diagnosis not present

## 2022-12-09 DIAGNOSIS — D3A090 Benign carcinoid tumor of the bronchus and lung: Secondary | ICD-10-CM | POA: Diagnosis not present

## 2022-12-09 DIAGNOSIS — Z743 Need for continuous supervision: Secondary | ICD-10-CM | POA: Diagnosis not present

## 2022-12-09 DIAGNOSIS — Z794 Long term (current) use of insulin: Secondary | ICD-10-CM | POA: Diagnosis not present

## 2022-12-09 DIAGNOSIS — I110 Hypertensive heart disease with heart failure: Secondary | ICD-10-CM | POA: Diagnosis not present

## 2022-12-09 DIAGNOSIS — I2510 Atherosclerotic heart disease of native coronary artery without angina pectoris: Secondary | ICD-10-CM | POA: Diagnosis not present

## 2022-12-09 DIAGNOSIS — M6281 Muscle weakness (generalized): Secondary | ICD-10-CM | POA: Diagnosis not present

## 2022-12-09 DIAGNOSIS — J9621 Acute and chronic respiratory failure with hypoxia: Secondary | ICD-10-CM | POA: Diagnosis not present

## 2022-12-09 DIAGNOSIS — E038 Other specified hypothyroidism: Secondary | ICD-10-CM | POA: Diagnosis not present

## 2022-12-09 DIAGNOSIS — J90 Pleural effusion, not elsewhere classified: Secondary | ICD-10-CM | POA: Diagnosis not present

## 2022-12-09 DIAGNOSIS — E43 Unspecified severe protein-calorie malnutrition: Secondary | ICD-10-CM | POA: Diagnosis not present

## 2022-12-09 DIAGNOSIS — I4821 Permanent atrial fibrillation: Secondary | ICD-10-CM | POA: Diagnosis not present

## 2022-12-09 DIAGNOSIS — I130 Hypertensive heart and chronic kidney disease with heart failure and stage 1 through stage 4 chronic kidney disease, or unspecified chronic kidney disease: Secondary | ICD-10-CM | POA: Diagnosis not present

## 2022-12-09 DIAGNOSIS — I5033 Acute on chronic diastolic (congestive) heart failure: Secondary | ICD-10-CM | POA: Diagnosis not present

## 2022-12-09 DIAGNOSIS — K219 Gastro-esophageal reflux disease without esophagitis: Secondary | ICD-10-CM | POA: Diagnosis not present

## 2022-12-09 DIAGNOSIS — E039 Hypothyroidism, unspecified: Secondary | ICD-10-CM | POA: Diagnosis not present

## 2022-12-09 DIAGNOSIS — G4700 Insomnia, unspecified: Secondary | ICD-10-CM | POA: Diagnosis not present

## 2022-12-09 DIAGNOSIS — D631 Anemia in chronic kidney disease: Secondary | ICD-10-CM | POA: Diagnosis not present

## 2022-12-09 LAB — BASIC METABOLIC PANEL
Anion gap: 11 (ref 5–15)
BUN: 80 mg/dL — ABNORMAL HIGH (ref 8–23)
CO2: 32 mmol/L (ref 22–32)
Calcium: 8.6 mg/dL — ABNORMAL LOW (ref 8.9–10.3)
Chloride: 89 mmol/L — ABNORMAL LOW (ref 98–111)
Creatinine, Ser: 1.77 mg/dL — ABNORMAL HIGH (ref 0.44–1.00)
GFR, Estimated: 28 mL/min — ABNORMAL LOW (ref >60)
Glucose, Bld: 161 mg/dL — ABNORMAL HIGH (ref 70–99)
Potassium: 3.5 mmol/L (ref 3.5–5.1)
Sodium: 132 mmol/L — ABNORMAL LOW (ref 135–145)

## 2022-12-09 LAB — GLUCOSE, CAPILLARY
Glucose-Capillary: 173 mg/dL — ABNORMAL HIGH (ref 70–99)
Glucose-Capillary: 312 mg/dL — ABNORMAL HIGH (ref 70–99)

## 2022-12-09 LAB — CBC
HCT: 34.1 % — ABNORMAL LOW (ref 36.0–46.0)
Hemoglobin: 11.1 g/dL — ABNORMAL LOW (ref 12.0–15.0)
MCH: 27.2 pg (ref 26.0–34.0)
MCHC: 32.6 g/dL (ref 30.0–36.0)
MCV: 83.6 fL (ref 80.0–100.0)
Platelets: 133 10*3/uL — ABNORMAL LOW (ref 150–400)
RBC: 4.08 MIL/uL (ref 3.87–5.11)
RDW: 14.1 % (ref 11.5–15.5)
WBC: 10.8 10*3/uL — ABNORMAL HIGH (ref 4.0–10.5)
nRBC: 0 % (ref 0.0–0.2)

## 2022-12-09 MED ORDER — INSULIN GLARGINE-YFGN 100 UNIT/ML ~~LOC~~ SOLN: 25.0000 [IU] | Freq: Every day | SUBCUTANEOUS | 0 refills | Status: DC

## 2022-12-09 MED ORDER — POLYETHYLENE GLYCOL 3350 17 G PO PACK: 17.0000 g | PACK | Freq: Every day | ORAL | 0 refills | Status: AC | PRN

## 2022-12-09 NOTE — Progress Notes (Signed)
Patient ID: [name], female   DOB: 1934-04-14, 88 y.o.   MRN: [id number]    Progress Note from the Palliative Medicine Team at [facility]   Patient Name: [name]        Date: 12/09/2022 DOB: 86-06-1934  Age: 88 y.o. MRN#: [id number] Attending Physician: Richarda Osmond, MD Primary Care Physician: [name], FNP Admit Date: 12/01/2022   Medical records reviewed   88 y.o. female   admitted on 12/01/2022 with past medical history significant of CAD, pulmonary hypertension, permanent atrial fibrillation, type 2 diabetes, hypertension, grade 4 low-grade lung carcinoid/neuroendocrine tumor, who presents to the ED with complaints of increased shortness of breath.   Recently admitted from 11/14 to 11/29 for acute hypoxic respiratory failure felt to be secondary to acute on chronic HFpEF and stage IV low-grade lung carcinoid tumor.  She was discharged on supplemental oxygen, 2-3 L to maintain oxygen saturation above 88%.   Reported  worsening shortness of breath and dyspnea on exertion for the last couple months.  Since she has been discharged to SNF, her breathing has not significantly improved. Nausea and vomiting after eating ongoing.      Of note, patient was recently admitted from 11/14 to 11/29 for acute hypoxic respiratory failure felt to be secondary to acute on chronic HFpEF and stage IV low-grade lung carcinoid tumor.  She was discharged on supplemental oxygen, 2-3 L to maintain oxygen saturation above 88%.  Admitted for treatment and stabilization.   Currently awaiting SNF placement for rehab.   Patient and family face ongoing  treatment option decisions,  advanced directive decisions and anticipatory care needs.   Clinical Assessment and Goals of Care:   PMT continues to shadow for needs.  This nurse practitioner has had multiple meetings with patient and family on past admission and current admission.      Continued conversation regarding current  medical situation and my concern for her ongoing weakness and failure to thrive.  Patient recognizes the seriousness.  She speaks very strongly to her desire to get well, she hopes for time and improvement.    Family support and encourage her wishes  Plan of care: -DNR/DNI -open to all offered and available medical interventions to prolong life. -return to SNF for ongoing rehab -recommend OP palliative services   Education offered today regarding  the importance of continued conversation with family and the medical providers regarding overall plan of care and treatment options,  ensuring decisions are within the context of the patients values and GOCs.  Patient is high risk for decompensation  No charge   Wadie Lessen NP  Palliative Medicine Team Team Phone # [phone] Pager [phone]

## 2022-12-09 NOTE — Progress Notes (Signed)
Occupational Therapy Treatment Patient Details Name: [name] MRN: [id number] DOB: February 15, 1934 Today's Date: 12/09/2022   History of present illness 88 y/o female presented to ED on 12/01/22 for SOB and dyspnea on exertion. Admitted for acute respiratory failure 2/2 PNA. PMH: CAD, pulmonary htn, afib, dm, htn, lung CA, stroke.   OT comments  Upon entering the room, pt supine in bed with no c/o pain and agreeable to OT intervention. Pt performing bed mobility with min A for trunk support to EOB. Once EOB, pt then reports need to have BM. Pt stands and transfers to [facility] with min A. Pt able to have BM and needs assistance for hygiene and clothing management to don pull up brief. Pt taking seated rest break and then stands at sink with min guard for balance to brush teeth while standing at sink. Pt then returns to EOB with min A. Pt on 2 Ls O2 via [place] and at 96% or above during session with activity. Pt does return to bed secondary to fatigue at end of session. All needs within reach and bed alarm activated.    Recommendations for follow up therapy are one component of a multi-disciplinary discharge planning process, led by the attending physician.  Recommendations may be updated based on patient status, additional functional criteria and insurance authorization.    Follow Up Recommendations  Skilled nursing-short term rehab (<3 hours/day)     Assistance Recommended at Discharge Frequent or constant Supervision/Assistance  Patient can return home with the following  A little help with walking and/or transfers;A little help with bathing/dressing/bathroom;Assistance with cooking/housework;Direct supervision/assist for medications management   Equipment Recommendations  Other (comment) (defer to next venue of care)       Precautions / Restrictions Precautions Precautions: Fall Precaution Comments: watch O2 and HR Restrictions Weight Bearing Restrictions: No       Mobility Bed  Mobility Overal bed mobility: Needs Assistance Bed Mobility: Sit to Supine, Supine to Sit     Supine to sit: Min assist Sit to supine: Supervision   General bed mobility comments: assistance for trunk support and cuing for technique and hand placement    Transfers Overall transfer level: Needs assistance Equipment used: Rolling walker (2 wheels) Transfers: Sit to/from Stand, Bed to chair/wheelchair/BSC Sit to Stand: Min guard     Step pivot transfers: Min guard     General transfer comment: able to transfer to [facility] from recliner with min guard.     Balance Overall balance assessment: Needs assistance Sitting-balance support: Feet supported Sitting balance-Leahy Scale: Good     Standing balance support: Bilateral upper extremity supported, During functional activity, Reliant on assistive device for balance Standing balance-Leahy Scale: Fair                             ADL either performed or assessed with clinical judgement   ADL Overall ADL's : Needs assistance/impaired     Grooming: Oral care;Standing;Min guard               Lower Body Dressing: Minimal assistance;Sit to/from stand   Toilet Transfer: Minimal assistance;BSC/3in1   Toileting- Water quality scientist and Hygiene: Moderate assistance;Sit to/from stand Toileting - Clothing Manipulation Details (indicate cue type and reason): assistance for hygiene and clothing management            Extremity/Trunk Assessment Upper Extremity Assessment Upper Extremity Assessment: Generalized weakness   Lower Extremity Assessment Lower Extremity Assessment: Generalized weakness  Vision Baseline Vision/History: 1 Wears glasses Patient Visual Report: No change from baseline            Cognition Arousal/Alertness: Awake/alert Behavior During Therapy: WFL for tasks assessed/performed Overall Cognitive Status: Within Functional Limits for tasks assessed                                  General Comments: Pt is very pleasant and agreeable                   Pertinent Vitals/ Pain       Pain Assessment Pain Assessment: No/denies pain         Frequency  Min 2X/week        Progress Toward Goals  OT Goals(current goals can now be found in the care plan section)  Progress towards OT goals: Progressing toward goals  Acute Rehab OT Goals Patient Stated Goal: return to rehab OT Goal Formulation: With patient Time For Goal Achievement: 12/19/22 Potential to Achieve Goals: Good  Plan Discharge plan remains appropriate;Frequency remains appropriate       AM-PAC OT "6 Clicks" Daily Activity     Outcome Measure   Help from another person eating meals?: None Help from another person taking care of personal grooming?: A Little Help from another person toileting, which includes using toliet, bedpan, or urinal?: A Little Help from another person bathing (including washing, rinsing, drying)?: A Little Help from another person to put on and taking off regular upper body clothing?: None Help from another person to put on and taking off regular lower body clothing?: A Little 6 Click Score: 20    End of Session Equipment Utilized During Treatment: Rolling walker (2 wheels);Oxygen  OT Visit Diagnosis: Unsteadiness on feet (R26.81);Muscle weakness (generalized) (M62.81)   Activity Tolerance Patient tolerated treatment well   Patient Left in bed;with call bell/phone within reach;with bed alarm set   Nurse Communication Mobility status        Time: 0867-6195 OT Time Calculation (min): 20 min  Charges: OT General Charges $OT Visit: 1 Visit OT Treatments $Self Care/Home Management : 8-22 mins  Darleen Crocker, MS, OTR/L , CBIS ascom [phone]  12/09/22, 12:49 PM

## 2022-12-09 NOTE — Progress Notes (Signed)
Report called to [facility]

## 2022-12-09 NOTE — Inpatient Diabetes Management (Signed)
Inpatient Diabetes Program Recommendations  AACE/ADA: New Consensus Statement on Inpatient Glycemic Control (2015)  Target Ranges:  Prepandial:   less than 140 mg/dL      Peak postprandial:   less than 180 mg/dL (1-2 hours)      Critically ill patients:  140 - 180 mg/dL   Lab Results  Component Value Date   GLUCAP 173 (H) 12/09/2022   HGBA1C 5.8 (H) 11/12/2022    Review of Glycemic Control  Diabetes history: DM2 Outpatient Diabetes medications:  Tresiba 12 units QD Novolog 0-9 units TID Current orders for Inpatient glycemic control:  Semglee 25 units QD Novolog 0-20 units TID  Inpatient Diabetes Program Recommendations:    Novolog 2 units TID with meals if consumes at least 50%.  Will continue to follow while inpatient.  Thank you, Reche Dixon, MSN, [place] Diabetes Coordinator Inpatient Diabetes Program [phone] (team pager from 8a-5p)

## 2022-12-09 NOTE — Consult Note (Signed)
   [facility] CM Inpatient Consult   12/09/2022  MARIJAH LARRANAGA 1934-01-20 [id number] [facility] [ACO] Patient: [employer] Medicare  Primary Care Provider:  Adria Dill, [place] Reader, FNP, with [facility] Liaison remote coverage for patient at [facility]  Patient was reviewed for less than 7 days readmission extreme high risk score and length of stay to assess for community barriers to care.  Currently, patient is for [facility].  If the patient goes to a [facility] affiliated facility then, patient can be followed by [place] [facility] PAC RN with traditional Medicare and approved Medicare Advantage plans.   Plan:   Notify [facility] RN who can follow for any known or needs for transitional care needs for returning to post facility care coordination needs to return to community.  For questions or referrals, please contact:   Natividad Brood, RN BSN [place]  [phone] business mobile phone Toll free office [phone]  *[place]  [phone] Fax number: [phone] [email or web address]

## 2022-12-09 NOTE — Progress Notes (Signed)
Physical Therapy Treatment Patient Details Name: NEIDA ELLEGOOD MRN: [id number] DOB: 1934/03/08 Today's Date: 12/09/2022   History of Present Illness 88 y/o female presented to ED on 12/01/22 for SOB and dyspnea on exertion. Admitted for acute respiratory failure 2/2 PNA. PMH: CAD, pulmonary htn, afib, dm, htn, lung CA, stroke.    PT Comments    Patient on [facility] on arrival and required assist for pericare.  Patient agreeable to PT tx session. Ambulated 20' in room with RW and min guard with 2/4 DOE. Required extended seated rest break after mobility with VSS on 2L. Performed sit to stand x 6 with seated rest break after every 2 stands. Eager to discharge back to rehab to get stronger in hopes to return home. Continue to recommend SNF for ongoing Physical Therapy.       Recommendations for follow up therapy are one component of a multi-disciplinary discharge planning process, led by the attending physician.  Recommendations may be updated based on patient status, additional functional criteria and insurance authorization.  Follow Up Recommendations  Skilled nursing-short term rehab (<3 hours/day) Can patient physically be transported by private vehicle: Yes   Assistance Recommended at Discharge Frequent or constant Supervision/Assistance  Patient can return home with the following Help with stairs or ramp for entrance;Assistance with cooking/housework;A little help with walking and/or transfers;A little help with bathing/dressing/bathroom;Assist for transportation   Equipment Recommendations  None recommended by PT    Recommendations for Other Services       Precautions / Restrictions Precautions Precautions: Fall Precaution Comments: watch O2 and HR Restrictions Weight Bearing Restrictions: No     Mobility  Bed Mobility               General bed mobility comments: on BSC on arrival    Transfers Overall transfer level: Needs assistance Equipment used: Rolling Charmon Thorson (2  wheels) Transfers: Sit to/from Stand Sit to Stand: Min guard           General transfer comment: min guard for safety. Able to complete sit to stand x 6    Ambulation/Gait Ambulation/Gait assistance: Min guard Gait Distance (Feet): 20 Feet Assistive device: Rolling Marilene Vath (2 wheels) Gait Pattern/deviations: Step-to pattern, Decreased stride length, Trunk flexed Gait velocity: decreaesd     General Gait Details: trunk flexed. Cues for upright posture. 2/4 DOE   Marine scientist Rankin (Stroke Patients Only)       Balance Overall balance assessment: Needs assistance Sitting-balance support: Feet supported Sitting balance-Leahy Scale: Good     Standing balance support: Bilateral upper extremity supported, During functional activity, Reliant on assistive device for balance Standing balance-Leahy Scale: Fair                              Cognition Arousal/Alertness: Awake/alert Behavior During Therapy: WFL for tasks assessed/performed Overall Cognitive Status: Within Functional Limits for tasks assessed                                          Exercises Other Exercises Other Exercises: sit to stand x 6    General Comments        Pertinent Vitals/Pain Pain Assessment Pain Assessment: No/denies pain    Home Living  Prior Function            PT Goals (current goals can now be found in the care plan section) Acute Rehab PT Goals PT Goal Formulation: With patient Time For Goal Achievement: 12/19/22 Potential to Achieve Goals: Good Progress towards PT goals: Progressing toward goals    Frequency    Min 2X/week      PT Plan Current plan remains appropriate    Co-evaluation              AM-PAC PT "6 Clicks" Mobility   Outcome Measure  Help needed turning from your back to your side while in a flat bed without using bedrails?: A  Little Help needed moving from lying on your back to sitting on the side of a flat bed without using bedrails?: A Little Help needed moving to and from a bed to a chair (including a wheelchair)?: A Little Help needed standing up from a chair using your arms (e.g., wheelchair or bedside chair)?: A Little Help needed to walk in hospital room?: A Lot Help needed climbing 3-5 steps with a railing? : Total 6 Click Score: 15    End of Session Equipment Utilized During Treatment: Oxygen Activity Tolerance: Patient tolerated treatment well Patient left: in chair;with call bell/phone within reach;with chair alarm set;with family/visitor present Nurse Communication: Mobility status PT Visit Diagnosis: Unsteadiness on feet (R26.81);Muscle weakness (generalized) (M62.81)     Time: 6761-9509 PT Time Calculation (min) (ACUTE ONLY): 24 min  Charges:  $Therapeutic Activity: 23-37 mins                     Siana Panameno A. Gilford Rile PT, DPT [facility] - Acute Rehabilitation Services    Nelta Caudill A Taesha Goodell 12/09/2022, 3:17 PM

## 2022-12-09 NOTE — Discharge Summary (Signed)
Physician Discharge Summary  Patient: [name] RPR:[id number] DOB: 1934/08/05   Code Status: DNR Admit date: 12/01/2022 Discharge date: 12/09/2022 Disposition: Skilled nursing facility, PT, OT, nurse aid, and RN PCP: [name], FNP  Recommendations for Outpatient Follow-up:  Follow up with PCP within 1-2 weeks Regarding general hospital follow up and preventative care Recommend CBC, CMP and blood pressure monitoring with medication changes as listed below. Received 1u pRBCs during admission Follow up with SNF provider Recommend close monitoring of blood glucose. Patient had significant hyperglycemia during course of steroids which have been completed. She required high amounts of insulin while admitted to control her blood sugar levels but this will likely need to be drastically reduced with the completion of her steroid course.  Discharged with 2Lnc and may be able to wean to room air in near future  Discharge Diagnoses:  Principal Problem:   Acute hypoxic respiratory failure (HCC) Active Problems:   Atrial fibrillation with RVR (HCC)   CAD (coronary artery disease)   DM2 (diabetes mellitus, type 2) (HCC)   Stage 3b chronic kidney disease (CKD) (HCC)   Acute on chronic respiratory failure with hypoxemia (HCC)   HAP (hospital-acquired pneumonia)   Anxiety   Malignant neoplasm of lung (HCC)   Acute hypoxemic respiratory failure (HCC)   Palliative care encounter   Normocytic anemia   History of stroke   Atrial fibrillation, chronic (HCC)   Malnutrition of moderate degree  Brief Hospital Course Summary: [name] is a 88 y.o. female with a PMH significant for  CAD, pulmonary hypertension, permanent atrial fibrillation, type 2 diabetes, hypertension, grade 4 low-grade lung carcinoid/neuroendocrine tumor.   They presented from SNF to the ED on 12/01/2022 with SOB for several months but acutely worsened x 2 days. She had episodes of nausea and vomiting after  eating day prior to arrival.  She was recently discharged to the SNF on 2 to 3 L of oxygen following an admission for acute hypoxic respiratory failure secondary to acute on chronic HFpEF and stage IV low-grade lung carcinoid tumor.   In the ED, it was found that they had tachycardia to 151, blood pressure 127/72, hypoxic at her discharge oxygen level to 85% O2 which improved to normal levels with increasing oxygen up to 6 L.  She had significant increased work of breathing and was started on heated high flow nasal cannula.  Significant findings included normal pCO2. BNP improved compared to prior at 228.  White blood count within normal limits.  Influenza and COVID-19 PCR negative.  Chest x-ray with bibasilar collapse/consolidation due to atelectasis versus pneumonia.   They were initially treated with vancomycin and cefepime for concern of HAP. She completed course of cefepime x7 days as well as steroids.    12/7- blood transfusion for hgb <8 and contributing to SOB. Also started low dose morphine for air hunger. Able to wean from [place] 50% FiO2 to regular HFNC at 15L and remained stable >90%.    12/09/22 - patient made very gradual improvement in her respiratory status and daily had reduction in oxygen dependence. On day of discharge, she was stable on 2L [place] with normal work of breathing. She has a hoarse voice and mild cough but denies dyspnea, orthopnea or CP. She is able to tolerate a normal diet. Her mobility is limited due to SOB with exertion. PT/OT evaluated her and recommended SNF at dc. Patient and family agreeable. She will be discharged on 2L [place] which may be  able to be weaned to room air in near future.   Additionally, patient exhibited anxiety about dying daily but declined any anxiolytics. Palliative met with her and her son to discuss [place] and she remained DNR with goal to pursue all medical treatment options available to her. They will continue to follow outpatient given her frail state.    Discharge Condition: Stable, improved Recommended discharge diet: Regular healthy diet  Consultations: None   Procedures/Studies: 1u pRBCs transfused   Allergies as of 12/09/2022       Reactions   Codeine Nausea And Vomiting   Hydrocodone-acetaminophen Nausea And Vomiting        Medication List     STOP taking these medications    cyanocobalamin 1000 MCG tablet Commonly known as: VITAMIN B12   magnesium hydroxide 400 MG/5ML suspension Commonly known as: MILK OF MAGNESIA   omeprazole 20 MG tablet Commonly known as: PRILOSEC OTC   pantoprazole 40 MG tablet Commonly known as: Protonix   rosuvastatin 20 MG tablet Commonly known as: CRESTOR   Tresiba FlexTouch 100 UNIT/ML FlexTouch Pen Generic drug: insulin degludec   Tums 500 MG chewable tablet Generic drug: calcium carbonate       TAKE these medications    acetaminophen 325 MG tablet Commonly known as: TYLENOL Take 2 tablets (650 mg total) by mouth every 6 (six) hours as needed for mild pain, fever or headache.   apixaban 2.5 MG Tabs tablet Commonly known as: ELIQUIS Take 1 tablet (2.5 mg total) by mouth 2 (two) times daily.   budesonide 0.5 MG/2ML nebulizer solution Commonly known as: PULMICORT Take 2 mLs (0.5 mg total) by nebulization 2 (two) times daily.   [employer] CALCIUM CITRATE +D3 MINI PO Take 1 capsule by mouth daily.   diltiazem 120 MG 24 hr capsule Commonly known as: CARDIZEM CD Take 1 capsule (120 mg total) by mouth daily.   eucerin cream Apply 1 Application topically 2 (two) times daily.   Fluocinolone Acetonide 0.01 % Oil Place 5 drops in ear(s) daily.   furosemide 20 MG tablet Commonly known as: Lasix Take 1 tablet (20 mg total) by mouth daily.   insulin aspart 100 UNIT/ML injection Commonly known as: novoLOG Inject 0-9 Units into the skin 3 (three) times daily with meals.   insulin glargine-yfgn 100 UNIT/ML injection Commonly known as: SEMGLEE Inject 0.25 mLs (25 Units  total) into the skin daily. Start taking on: December 10, 2022   ipratropium-albuterol 0.5-2.5 (3) MG/3ML Soln Commonly known as: DUONEB Take 3 mLs by nebulization every 6 (six) hours as needed.   lactose free nutrition Liqd Take 237 mLs by mouth daily.   feeding supplement Liqd Take 237 mLs by mouth 2 (two) times daily between meals.   levothyroxine 88 MCG tablet Commonly known as: SYNTHROID Take 88 mcg by mouth daily.   metoprolol succinate 25 MG 24 hr tablet Commonly known as: TOPROL-XL Take 1 tablet (25 mg total) by mouth daily.   multivitamin with minerals Tabs tablet Take 1 tablet by mouth daily.   NovoFine Plus Pen Needle 32G X 4 MM Misc Generic drug: Insulin Pen Needle ONE   ondansetron 4 MG tablet Commonly known as: ZOFRAN Take 1 tablet (4 mg total) by mouth every 8 (eight) hours as needed for nausea or vomiting.   polyethylene glycol 17 g packet Commonly known as: MIRALAX / GLYCOLAX Take 17 g by mouth daily as needed for mild constipation.   Stiolto Respimat 2.5-2.5 MCG/ACT Aers Generic drug: Tiotropium Bromide-Olodaterol Inhale  2 puffs into the lungs daily.   traZODone 50 MG tablet Commonly known as: DESYREL Take 1 tablet (50 mg total) by mouth at bedtime as needed for sleep.        Subjective   Pt reports feeling well today. Continues to ask me questions surrounding her life expectancy. "Am I going to make it?" "Am I going to survive?" I tell her that I can't guarantee anything and that eventually we all die but for now she is in stable condition and continues to improve from her current illness.   All questions and concerns were addressed at time of discharge.  Objective  Blood pressure 130/78, pulse (!) 107, temperature (!) 97.5 F (36.4 C), resp. rate 20, height _0  (1.575 m), weight 53.8 kg, SpO2 100 %.   General: Pt is alert, awake, not in acute distress Cardiovascular: RRR, S1/S2 +, no rubs, no gallops Respiratory: CTA bilaterally, no  wheezing, mild rhonchi cleared with coughing Abdominal: Soft, NT, ND, bowel sounds + Extremities: no edema, no cyanosis Psych: anxious  The results of significant diagnostics from this hospitalization (including imaging, microbiology, ancillary and laboratory) are listed below for reference.   Imaging studies: DG Chest Portable 1 View  Result Date: 12/01/2022 CLINICAL DATA:  Respiratory distress, hypoxemia. EXAM: PORTABLE CHEST 1 VIEW COMPARISON:  11/23/2022 and CT chest 11/12/2022. FINDINGS: Trachea is midline. Heart is enlarged, stable. Lungs are low in volume with interstitial prominence and indistinctness. Bibasilar collapse/consolidation. Small bilateral pleural effusions. IMPRESSION: 1. Congestive heart failure. 2. Bibasilar collapse/consolidation may be due to atelectasis and/or pneumonia. Electronically Signed   By: Lorin Picket M.D.   On: 12/01/2022 16:03   CT WRIST RIGHT WO CONTRAST  Result Date: 11/23/2022 CLINICAL DATA:  Wrist pain. EXAM: CT OF THE RIGHT WRIST WITHOUT CONTRAST TECHNIQUE: Multidetector CT imaging of the right wrist was performed according to the standard protocol. Multiplanar CT image reconstructions were also generated. RADIATION DOSE REDUCTION: This exam was performed according to the departmental dose-optimization program which includes automated exposure control, adjustment of the mA and/or kV according to patient size and/or use of iterative reconstruction technique. COMPARISON:  Radiographs 11/22/2022 FINDINGS: Moderate wrist joint degenerative changes with joint space narrowing, subchondral cystic change and chondrocalcinosis. No acute fracture. No evidence of AVN. IMPRESSION: 1. Moderate wrist joint degenerative changes with chondrocalcinosis. 2. No acute fracture. Electronically Signed   By: Marijo Sanes M.D.   On: 11/23/2022 13:17   CT CERVICAL SPINE WO CONTRAST  Result Date: 11/23/2022 CLINICAL DATA:  Neck and right arm pain. EXAM: CT CERVICAL SPINE  WITHOUT CONTRAST TECHNIQUE: Multidetector CT imaging of the cervical spine was performed without intravenous contrast. Multiplanar CT image reconstructions were also generated. RADIATION DOSE REDUCTION: This exam was performed according to the departmental dose-optimization program which includes automated exposure control, adjustment of the mA and/or kV according to patient size and/or use of iterative reconstruction technique. COMPARISON:  CT scan from 1,013 FINDINGS: Examination limited by patient motion. Alignment: Grossly normal alignment. Skull base and vertebrae: No acute cervical spine fracture. Progressive significant C1-2 degenerative changes with pannus formation. Soft tissues and spinal canal: No prevertebral fluid or swelling. No visible canal hematoma. Disc levels: The spinal canal is fairly generous. No obvious large disc protrusions or spinal stenosis. Multilevel foraminal stenosis due to uncinate spurring facet disease. Upper chest: Emphysematous changes are noted along with bilateral pleural effusions. Other: No neck mass, adenopathy or hematoma. Bilateral carotid artery calcifications. IMPRESSION: 1. Limited examination due to patient motion. 2.  No acute cervical spine fracture. 3. Progressive significant C1-2 degenerative changes with pannus formation. 4. Mild multilevel foraminal stenosis due to uncinate spurring and facet disease. 5. Bilateral pleural effusions. Electronically Signed   By: Marijo Sanes M.D.   On: 11/23/2022 13:14   DG Elbow 2 Views Right  Result Date: 11/23/2022 CLINICAL DATA:  Elbow pain. EXAM: RIGHT ELBOW - 2 VIEW COMPARISON:  None Available. FINDINGS: Two-view exam shows no fracture or dislocation. Lateral film reveals no fat pad elevation to suggest joint effusion. No worrisome lytic or sclerotic osseous abnormality. IMPRESSION: Negative. Electronically Signed   By: Misty Stanley M.D.   On: 11/23/2022 11:51   DG Shoulder Right Port  Result Date:  11/23/2022 CLINICAL DATA:  Shoulder pain. EXAM: RIGHT SHOULDER - 1 VIEW COMPARISON:  None Available. FINDINGS: Bones are markedly demineralized. No evidence for an acute fracture or dislocation. Degenerative changes are seen in the glenohumeral joint and in the acromioclavicular joint. No worrisome lytic or sclerotic osseous abnormality. Changes in the right lung better evaluated on chest x-ray performed earlier today. IMPRESSION: Degenerative changes without acute bony findings. Electronically Signed   By: Misty Stanley M.D.   On: 11/23/2022 11:48   DG Chest Port 1 View  Result Date: 11/23/2022 CLINICAL DATA:  Shortness of breath. EXAM: PORTABLE CHEST 1 VIEW COMPARISON:  11/16/2022 FINDINGS: Cardiomediastinal contours are stable. Aortic atherosclerotic calcifications. Bilateral pleural effusions are unchanged in the interval. Persistent diffuse increase interstitial markings identified compatible with pulmonary edema. New bilateral upper lobe airspace opacities may reflect areas of asymmetric alveolar edema or pneumonia. Bilateral areas of subsegmental atelectasis have increased from previous exam. IMPRESSION: 1. Persistent pulmonary edema and bilateral pleural effusions. 2. New bilateral upper lobe airspace opacities may reflect areas of asymmetric alveolar edema or pneumonia. 3. Progressive bilateral areas of subsegmental atelectasis. Electronically Signed   By: Kerby Moors M.D.   On: 11/23/2022 07:17   VAS [place] UPPER EXTREMITY VENOUS DUPLEX  Result Date: 11/22/2022 UPPER VENOUS STUDY  Patient Name:  [name]  Date of Exam:   11/22/2022 Medical Rec #: [id number]         Accession #:    [id number] Date of Birth: 1934-06-24         Patient Gender: F Patient Age:   68 years Exam [facility] Procedure:      VAS [place] UPPER EXTREMITY VENOUS DUPLEX Referring Phys: Laurey Arrow --------------------------------------------------------------------------------  Indications: Erythema, and Pain  Risk Factors: None identified. Comparison Study: No prior studies. Performing Technologist: Oliver Hum RVT  Examination Guidelines: A complete evaluation includes B-mode imaging, spectral Doppler, color Doppler, and power Doppler as needed of all accessible portions of each vessel. Bilateral testing is considered an integral part of a complete examination. Limited examinations for reoccurring indications may be performed as noted.  Right Findings: +----------+------------+---------+-----------+----------+-------+ RIGHT     CompressiblePhasicitySpontaneousPropertiesSummary +----------+------------+---------+-----------+----------+-------+ IJV           Full       Yes       Yes                      +----------+------------+---------+-----------+----------+-------+ Subclavian    Full       Yes       Yes                      +----------+------------+---------+-----------+----------+-------+ Axillary      Full       Yes  Yes                      +----------+------------+---------+-----------+----------+-------+ Brachial      Full       Yes       Yes                      +----------+------------+---------+-----------+----------+-------+ Radial        Full                                          +----------+------------+---------+-----------+----------+-------+ Ulnar         Full                                          +----------+------------+---------+-----------+----------+-------+ Cephalic      Full                                          +----------+------------+---------+-----------+----------+-------+ Basilic       Full                                          +----------+------------+---------+-----------+----------+-------+  Left Findings: +----------+------------+---------+-----------+----------+-------+ LEFT      CompressiblePhasicitySpontaneousPropertiesSummary +----------+------------+---------+-----------+----------+-------+  Subclavian    Full       Yes       Yes                      +----------+------------+---------+-----------+----------+-------+  Summary:  Right: No evidence of deep vein thrombosis in the upper extremity. No evidence of superficial vein thrombosis in the upper extremity.  Left: No evidence of thrombosis in the subclavian.  *See table(s) above for measurements and observations.  Diagnosing physician: Harold Barban MD Electronically signed by Harold Barban MD on 11/22/2022 at 8:44:22 PM.    Final    DG Wrist 2 Views Right  Result Date: 11/22/2022 CLINICAL DATA:  Right wrist pain. EXAM: RIGHT WRIST - 2 VIEW COMPARISON:  None Available. FINDINGS: Linear lucency at the base of the radial styloid raises the question of nondisplaced radial styloid fracture. Heterogeneous mineralization in the distal radius and ulna is compatible with osteopenia and similar in appearance to contralateral wrist x-ray of 03/15/2016. Calcification of the TFCC noted. IMPRESSION: 1. Linear lucency at the base of the radial styloid raises the question of nondisplaced radial styloid fracture. CT or MRI could be used to further evaluate as clinically warranted. 2. Osteopenia. Electronically Signed   By: Misty Stanley M.D.   On: 11/22/2022 17:37   DG ESOPHAGUS W SINGLE CM (SOL OR THIN BA)  Result Date: 11/17/2022 CLINICAL DATA:  Dysphagia, globus sensation EXAM: ESOPHAGUS/BARIUM SWALLOW/TABLET STUDY TECHNIQUE: Single contrast examination was performed using thin liquid barium. This exam was performed by Reatha Armour, PA-C , and was supervised and interpreted by Dr Abigail Miyamoto. FLUOROSCOPY: Radiation Exposure Index (as provided by the fluoroscopic device): 17.40 mGy Kerma COMPARISON:  None Available. FINDINGS: Limited study performed due to patient's inability to tolerate positioning. Esophagus: Normal appearance. Esophageal motility: Severe esophageal dysmotility with stasis of contrast material in mid  and distal esophagus Hiatal  Hernia: None visualized Gastroesophageal reflux: None visualized. Ingested 22m barium tablet: Not given Other: None. IMPRESSION: Severe esophageal dysmotility, likely presbyesophagus. Limited, focused single-contrast exam performed. Electronically Signed   By: KAbigail MiyamotoM.D.   On: 11/17/2022 12:17   DG CHEST PORT 1 VIEW  Result Date: 11/16/2022 CLINICAL DATA:  Acute respiratory failure EXAM: PORTABLE CHEST 1 VIEW COMPARISON:  COMPARISON 11/11/2022 FINDINGS: Normal cardiac silhouette. Bilateral pleural effusions increased from prior. There is increased central venous congestion interstitial edema. Platelike atelectasis in the mid lungs. IMPRESSION: Increasing pleural effusions and interstitial edema Increased atelectasis in the mid lungs. Electronically Signed   By: SSuzy BouchardM.D.   On: 11/16/2022 11:06   CT ABDOMEN PELVIS WO CONTRAST  Result Date: 11/14/2022 CLINICAL DATA:  Nausea and vomiting. Bowel obstruction suspected. EXAM: CT ABDOMEN AND PELVIS WITHOUT CONTRAST TECHNIQUE: Multidetector CT imaging of the abdomen and pelvis was performed following the standard protocol without IV contrast. RADIATION DOSE REDUCTION: This exam was performed according to the departmental dose-optimization program which includes automated exposure control, adjustment of the mA and/or kV according to patient size and/or use of iterative reconstruction technique. COMPARISON:  Abdominal radiograph earlier today. CT 06/18/2021 FINDINGS: Lower chest: Moderate right and small left pleural effusion, unchanged or equivocally improved from chest CT 2 days ago. Compressive as well as bandlike atelectasis in the included lung bases. Normal heart size with coronary artery calcifications. Hepatobiliary: The previous increased hepatic density is less pronounced on the current exam. No evidence of focal hepatic lesion. Gallbladder physiologically distended, no calcified stone. Stable common bile duct dilatation at 11 mm likely  sequela of prior cholecystectomy. Pancreas: No ductal dilatation or inflammation. Spleen: Normal in size without focal abnormality. Adrenals/Urinary Tract: Normal adrenal glands. Lobulated left renal contours. No hydronephrosis. Simple cyst in the lower pole of the right kidney. No imaging follow-up is needed. No renal calculi. No evidence of solid renal lesion. There is slight retention of IV contrast from prior chest CTA. High-density bladder contents likely represents excretion of IV contrast from prior chest CT. No bladder wall thickening. Stomach/Bowel: The stomach is moderately distended with air and ingested contents. No gastric wall thickening or evidence of gastric outlet obstruction. There is no small bowel distention or small bowel obstruction. Majority of the small and large bowel is fluid-filled. Diverticulosis from the splenic flexure of the colon distally. No diverticulitis. No acute colonic inflammation. The appendix is not visualized. Vascular/Lymphatic: Moderate to advanced aortic atherosclerosis. No aortic aneurysm. No portal venous or mesenteric gas. No bulky abdominopelvic adenopathy. Reproductive: Status post hysterectomy. No adnexal masses. Other: No free air, free fluid, or intra-abdominal fluid collection. Diminutive fat containing supra-umbilical hernia. Musculoskeletal: Prominent Schmorl's node involving superior endplates of L3 and L5, chronic. Moderate lower lumbar facet hypertrophy. There are no acute or suspicious osseous abnormalities. IMPRESSION: 1. No bowel obstruction. 2. Fluid-filled small and large bowel, can be seen with diarrheal illness. No bowel inflammation. Colonic diverticulosis without diverticulitis. 3. Moderate right and small left pleural effusions, unchanged or equivocally improved from chest CT 2 days ago. Aortic Atherosclerosis (ICD10-I70.0). Electronically Signed   By: MKeith RakeM.D.   On: 11/14/2022 16:28   DG Abd 1 View  Result Date:  11/14/2022 CLINICAL DATA:  Nausea and vomiting EXAM: ABDOMEN - 1 VIEW COMPARISON:  CT Angio 11/12/22 FINDINGS: Marked gastric distention with ingested material. Small bilateral pleural effusions and bibasilar pulmonary opacities, unchanged. Surgical clips in the right upper quadrant. Paucity of bowel  gas limits the ability to assess for infection. Within this limitation, no dilated loops of bowel are visualized no pneumatosis. No pneumoperitoneum. IMPRESSION: Marked gastric distention. Assessment for the presence of a small bowel obstruction is limited due to the paucity of small bowel gas. If there is significant clinical concern for a small bowel obstruction, further evaluation with a CT of the abdomen/pelvis is recommended. Electronically Signed   By: Marin Roberts M.D.   On: 11/14/2022 13:00   ECHOCARDIOGRAM COMPLETE  Result Date: 11/13/2022    ECHOCARDIOGRAM REPORT   Patient Name:   [name] Date of Exam: 11/12/2022 Medical Rec #:  [id number]        Height:       62.0 in Accession #:    [id number]       Weight:       120.6 lb Date of Birth:  [birth year]        BSA:          1.542 m Patient Age:    76 years         BP:           130/70 mmHg Patient Gender: F                HR:           104 bpm. Exam Location:  Inpatient Procedure: 2D Echo, Cardiac Doppler and Color Doppler Indications:    Cardiomyopathy-Unspecified I42.9  History:        Patient has no prior history of Echocardiogram examinations and                 Patient has prior history of Echocardiogram examinations, most                 recent 07/09/2017. CAD, Stroke, Arrythmias:Atrial Fibrillation,                 Signs/Symptoms:Dyspnea, Shortness of Breath, Syncope and Chest                 Pain; Risk Factors:Hypertension and Diabetes.  Sonographer:    Ronny Flurry Sonographer#2:  Danne Baxter RDCS, FE, PE Referring Phys: [phone] ANKIT CHIRAG AMIN IMPRESSIONS  1. The aortic valve is calcified. There is moderate calcification of the  aortic valve. There is moderate thickening of the aortic valve. Aortic valve regurgitation is mild. Moderate aortic valve stenosis. Aortic valve area, by VTI measures 1.37 cm. Aortic valve mean gradient measures 22.8 mmHg. Aortic valve Vmax measures 3.06 m/s.  2. Left ventricular ejection fraction, by estimation, is 70 to 75%. The left ventricle has hyperdynamic function. The left ventricle has no regional wall motion abnormalities. There is moderate left ventricular hypertrophy. Left ventricular diastolic parameters are indeterminate.  3. Right ventricular systolic function is normal. The right ventricular size is normal. There is moderately elevated pulmonary artery systolic pressure. The estimated right ventricular systolic pressure is 25.4 mmHg.  4. The mitral valve is degenerative. Mild mitral valve regurgitation. Mild mitral stenosis. The mean mitral valve gradient is 6.0 mmHg.  5. The inferior vena cava is normal in size with greater than 50% respiratory variability, suggesting right atrial pressure of 3 mmHg. FINDINGS  Left Ventricle: Left ventricular ejection fraction, by estimation, is 70 to 75%. The left ventricle has hyperdynamic function. The left ventricle has no regional wall motion abnormalities. The left ventricular internal cavity size was normal in size. There is moderate left ventricular hypertrophy. Left ventricular diastolic parameters are indeterminate. Right Ventricle:  The right ventricular size is normal. No increase in right ventricular wall thickness. Right ventricular systolic function is normal. There is moderately elevated pulmonary artery systolic pressure. The tricuspid regurgitant velocity is 3.25 m/s, and with an assumed right atrial pressure of 3 mmHg, the estimated right ventricular systolic pressure is 76.1 mmHg. Left Atrium: Left atrial size was normal in size. Right Atrium: Right atrial size was normal in size. Pericardium: There is no evidence of pericardial effusion. Mitral  Valve: The mitral valve is degenerative in appearance. There is mild thickening of the mitral valve leaflet(s). There is mild calcification of the mitral valve leaflet(s). Mild mitral valve regurgitation. Mild mitral valve stenosis. MV peak gradient, 17.6 mmHg. The mean mitral valve gradient is 6.0 mmHg. Tricuspid Valve: The tricuspid valve is normal in structure. Tricuspid valve regurgitation is mild . No evidence of tricuspid stenosis. Aortic Valve: The aortic valve is calcified. There is moderate calcification of the aortic valve. There is moderate thickening of the aortic valve. Aortic valve regurgitation is mild. Moderate aortic stenosis is present. Aortic valve mean gradient measures 22.8 mmHg. Aortic valve peak gradient measures 37.4 mmHg. Aortic valve area, by VTI measures 1.37 cm. Pulmonic Valve: The pulmonic valve was normal in structure. Pulmonic valve regurgitation is trivial. No evidence of pulmonic stenosis. Aorta: The aortic root is normal in size and structure. Venous: The inferior vena cava is normal in size with greater than 50% respiratory variability, suggesting right atrial pressure of 3 mmHg. IAS/Shunts: No atrial level shunt detected by color flow Doppler.  LEFT VENTRICLE PLAX 2D LVIDd:         2.70 cm LVIDs:         1.70 cm LV PW:         1.20 cm LV IVS:        1.40 cm LVOT diam:     1.90 cm LV SV:         78 LV SV Index:   50 LVOT Area:     2.84 cm  RIGHT VENTRICLE             IVC RV S prime:     11.50 cm/s  IVC diam: 1.20 cm TAPSE (M-mode): 1.6 cm LEFT ATRIUM             Index        RIGHT ATRIUM           Index LA diam:        3.50 cm 2.27 cm/m   RA Area:     14.20 cm LA Vol (A2C):   31.9 ml 20.69 ml/m  RA Volume:   28.10 ml  18.22 ml/m LA Vol (A4C):   32.0 ml 20.75 ml/m LA Biplane Vol: 34.4 ml 22.31 ml/m  AORTIC VALVE AV Area (Vmax):    1.36 cm AV Area (Vmean):   1.39 cm AV Area (VTI):     1.37 cm AV Vmax:           305.80 cm/s AV Vmean:          226.000 cm/s AV VTI:             0.566 m AV Peak Grad:      37.4 mmHg AV Mean Grad:      22.8 mmHg LVOT Vmax:         147.00 cm/s LVOT Vmean:        110.933 cm/s LVOT VTI:          0.274 m  LVOT/AV VTI ratio: 0.48  AORTA Ao Root diam: 2.90 cm Ao Asc diam:  3.00 cm MITRAL VALVE              TRICUSPID VALVE MV Area VTI:  2.03 cm    TR Peak grad:   42.2 mmHg MV Peak grad: 17.6 mmHg   TR Vmax:        325.00 cm/s MV Mean grad: 6.0 mmHg MV Vmax:      2.09 m/s    SHUNTS MV Vmean:     102.5 cm/s  Systemic VTI:  0.27 m                           Systemic Diam: 1.90 cm Candee Furbish MD Electronically signed by Candee Furbish MD Signature Date/Time: 11/13/2022/6:39:48 AM    Final    CT Angio Chest PE W and/or Wo Contrast  Result Date: 11/12/2022 CLINICAL DATA:  Pulmonary embolus suspected EXAM: CT ANGIOGRAPHY CHEST WITH CONTRAST TECHNIQUE: Multidetector CT imaging of the chest was performed using the standard protocol during bolus administration of intravenous contrast. Multiplanar CT image reconstructions and MIPs were obtained to evaluate the vascular anatomy. RADIATION DOSE REDUCTION: This exam was performed according to the departmental dose-optimization program which includes automated exposure control, adjustment of the mA and/or kV according to patient size and/or use of iterative reconstruction technique. CONTRAST:  53m OMNIPAQUE IOHEXOL 350 MG/ML SOLN COMPARISON:  Chest CT dated July 11th 2018 FINDINGS: Cardiovascular: No evidence of pulmonary embolus. Normal heart size. No pericardial effusion. Normal caliber thoracic aorta with severe calcified plaque. Severe coronary artery calcifications. Mediastinum/Nodes: Patulous esophagus. New mildly enlarged mediastinal and right hilar lymph nodes. Reference right lower paratracheal lymph node measuring 1.0 cm in short axis on series 5, image 38, previously 0.7 cm. Reference right hilar lymph node measuring 1.4 cm on series 5, image 46, previously 0.9 cm. Lungs/Pleura: Central airways are patent. Mosaic  attenuation. Scattered linear opacities are seen in the lower lungs which are likely due to scarring or atelectasis. Visualized solid pulmonary nodules are unchanged in size when compared with prior exam. Reference peribronchovascular nodule of the right middle lobe measuring 2 mm in mean diameter on series 7, image 56, unchanged. Reference left lower lobe nodule measuring 6 mm on series 7, image 33, unchanged. Moderate right and small left pleural effusions with associated atelectasis. Upper Abdomen: No acute abnormality. Musculoskeletal: No chest wall abnormality. No acute or significant osseous findings. Review of the MIP images confirms the above findings. IMPRESSION: 1. No evidence of pulmonary embolus. 2. Moderate right and small left pleural effusions with associated atelectasis. 3. New mildly enlarged mediastinal and right hilar lymph nodes, likely reactive. Recommend follow-up chest CT in 3 months to ensure resolution. 4. 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 5. Mosaic attenuation of the lungs, likely due to small airways disease. 6. Severe coronary artery calcifications. 7. Aortic Atherosclerosis (ICD10-I70.0). Electronically Signed   By: LYetta GlassmanM.D.   On: 11/12/2022 11:04   DG Chest 2 View  Result Date: 11/11/2022 CLINICAL DATA:  Shortness of breath EXAM: CHEST - 2 VIEW COMPARISON:  12/30/2021 FINDINGS: Stable heart size. Aortic atherosclerosis. Streaky bilateral perihilar and bibasilar opacities, may reflect atelectasis. Small layering bilateral pleural effusions. Known bilateral pulmonary nodules, less conspicuous on the current exam. No pneumothorax. IMPRESSION: Streaky bilateral perihilar and bibasilar opacities, may reflect atelectasis.  Small layering bilateral pleural effusions. Electronically Signed   By: Davina Poke D.O.   On: 11/11/2022 12:42     Labs: Basic Metabolic Panel: Recent Labs  Lab 12/04/22 0500 12/05/22 0742 12/06/22 0423 12/07/22 0613 12/08/22 0456 12/09/22 0205  NA 130*  --  130* 133* 136 132*  K 3.9  --  4.2 3.8 3.5 3.5  CL 94*  --  91* 93* 94* 89*  CO2 26  --  28 32 30 32  GLUCOSE 395* 547* 466* 240* 167* 161*  BUN 49*  --  70* 72* 77* 80*  CREATININE 1.62*  --  1.99* 1.76* 1.79* 1.77*  CALCIUM 8.3*  --  8.3* 8.6* 8.8* 8.6*   CBC: Recent Labs  Lab 12/05/22 0808 12/06/22 0423 12/07/22 0613 12/08/22 0456 12/09/22 0205  WBC 7.6 8.5 8.2 11.1* 10.8*  HGB 10.0* 10.0* 10.5* 11.4* 11.1*  HCT 30.7* 31.4* 32.6* 35.8* 34.1*  MCV 85.5 85.3 84.2 85.2 83.6  PLT 160 156 147* 141* 133*   Microbiology: Results for orders placed or performed during the hospital encounter of 12/01/22  Culture, blood (routine x 2)     Status: None   Collection Time: 12/01/22  3:03 PM   Specimen: BLOOD  Result Value Ref Range Status   Specimen Description BLOOD LEFT [facility]  Final   Special Requests   Final    BOTTLES DRAWN AEROBIC AND ANAEROBIC Blood Culture adequate volume   Culture   Final    NO GROWTH 5 DAYS Performed at [facility], [address] [place], [place] [address]    Report Status 12/07/2022 FINAL  Final  Resp Panel by RT-PCR (Flu A&B, Covid) Anterior Nasal Swab     Status: None   Collection Time: 12/01/22  3:17 PM   Specimen: Anterior Nasal Swab  Result Value Ref Range Status   SARS Coronavirus 2 by RT PCR NEGATIVE NEGATIVE Final    Comment: (NOTE) SARS-CoV-2 target nucleic acids are NOT DETECTED.  The SARS-CoV-2 RNA is generally detectable in upper respiratory specimens during the acute phase of infection. The lowest concentration of SARS-CoV-2 viral copies this assay can detect is 138 copies/mL. A negative result does not preclude SARS-Cov-2 infection and should not be used as the sole basis for treatment or other patient management decisions. A negative result may occur with  improper  specimen collection/handling, submission of specimen other than nasopharyngeal swab, presence of viral mutation(s) within the areas targeted by this assay, and inadequate number of viral copies(<138 copies/mL). A negative result must be combined with clinical observations, patient history, and epidemiological information. The expected result is Negative.  Fact Sheet for Patients:  EntrepreneurPulse.com.au  Fact Sheet for Healthcare Providers:  IncredibleEmployment.be  This test is no t yet approved or cleared by the [place] FDA and  has been authorized for detection and/or diagnosis of SARS-CoV-2 by FDA under an Emergency Use Authorization (EUA). This EUA will remain  in effect (meaning this test can be used) for the duration of the COVID-19 declaration under Section 564(b)(1) of the Act, 21 U.S.C.section 360bbb-3(b)(1), unless the authorization is terminated  or revoked sooner.       Influenza A by PCR NEGATIVE NEGATIVE Final   Influenza B by PCR NEGATIVE NEGATIVE Final    Comment: (NOTE) The Xpert Xpress SARS-CoV-2/FLU/RSV plus assay is intended as an aid in the diagnosis of influenza from Nasopharyngeal swab specimens and should not be used as a sole basis for treatment. Nasal washings and aspirates are  unacceptable for Xpert Xpress SARS-CoV-2/FLU/RSV testing.  Fact Sheet for Patients: EntrepreneurPulse.com.au  Fact Sheet for Healthcare Providers: IncredibleEmployment.be  This test is not yet approved or cleared by the [place] FDA and has been authorized for detection and/or diagnosis of SARS-CoV-2 by FDA under an Emergency Use Authorization (EUA). This EUA will remain in effect (meaning this test can be used) for the duration of the COVID-19 declaration under Section 564(b)(1) of the Act, 21 U.S.C. section 360bbb-3(b)(1), unless the authorization is terminated or revoked.  Performed at  [facility], [address] [place], [place] [address]   Culture, blood (routine x 2)     Status: None   Collection Time: 12/01/22  3:39 PM   Specimen: BLOOD  Result Value Ref Range Status   Specimen Description BLOOD RIGHT [facility]  Final   Special Requests   Final    BOTTLES DRAWN AEROBIC AND ANAEROBIC Blood Culture adequate volume   Culture   Final    NO GROWTH 5 DAYS Performed at [facility], [address], [place], [place] [address]    Report Status 12/07/2022 FINAL  Final  Urine Culture     Status: Abnormal   Collection Time: 12/01/22  8:27 PM   Specimen: Urine, Random  Result Value Ref Range Status   Specimen Description   Final    URINE, RANDOM Performed at [facility], [address] [place], [place] [address]    Special Requests   Final    NONE Performed at [facility], [address] [place], [place] [address]    Culture MULTIPLE SPECIES PRESENT, SUGGEST RECOLLECTION (A)  Final   Report Status 12/03/2022 FINAL  Final  Respiratory (~20 pathogens) panel by PCR     Status: None   Collection Time: 12/02/22  2:41 AM   Specimen: Nasal Mucosa; Respiratory  Result Value Ref Range Status   Adenovirus NOT DETECTED NOT DETECTED Final   Coronavirus 229E NOT DETECTED NOT DETECTED Final    Comment: (NOTE) The Coronavirus on the Respiratory Panel, DOES NOT test for the novel  Coronavirus (2019 nCoV)    Coronavirus HKU1 NOT DETECTED NOT DETECTED Final   Coronavirus NL63 NOT DETECTED NOT DETECTED Final   Coronavirus OC43 NOT DETECTED NOT DETECTED Final   Metapneumovirus NOT DETECTED NOT DETECTED Final   Rhinovirus / Enterovirus NOT DETECTED NOT DETECTED Final   Influenza A NOT DETECTED NOT DETECTED Final   Influenza B NOT DETECTED NOT DETECTED Final   Parainfluenza Virus 1 NOT DETECTED NOT DETECTED Final   Parainfluenza Virus 2 NOT DETECTED NOT DETECTED Final   Parainfluenza Virus 3 NOT DETECTED NOT DETECTED Final   Parainfluenza  Virus 4 NOT DETECTED NOT DETECTED Final   Respiratory Syncytial Virus NOT DETECTED NOT DETECTED Final   Bordetella pertussis NOT DETECTED NOT DETECTED Final   Bordetella Parapertussis NOT DETECTED NOT DETECTED Final   Chlamydophila pneumoniae NOT DETECTED NOT DETECTED Final   Mycoplasma pneumoniae NOT DETECTED NOT DETECTED Final    Comment: Performed at [facility], [place]. [address], [place], [place] [address]  MRSA Next Gen by PCR, Nasal     Status: None   Collection Time: 12/02/22  2:41 AM   Specimen: Nasal Mucosa; Nasal Swab  Result Value Ref Range Status   MRSA by PCR Next Gen NOT DETECTED NOT DETECTED Final    Comment: (NOTE) The GeneXpert MRSA Assay (FDA approved for NASAL specimens only), is one component of a comprehensive MRSA colonization surveillance program.  It is not intended to diagnose MRSA infection nor to guide or monitor treatment for MRSA infections. Test performance is not FDA approved in patients less than 93 years old. Performed at [facility], [address], [place], [place] [address]    Time coordinating discharge: Over 30 minutes  Richarda Osmond, MD  [facility] 12/09/2022, 12:07 PM

## 2022-12-09 NOTE — TOC Transition Note (Addendum)
Transition of Care [facility]) - CM/SW Discharge Note   Patient Details  Name: TAMMATHA COBB MRN: [id number] Date of Birth: 03-15-1934  Transition of Care [employer] [facility]) CM/SW Contact:  Laurena Slimmer, RN Phone Number: 12/09/2022, 12:53 PM   Clinical Narrative:    Per [facility] portal patient is authorized from 12/11-12/13   Spoke with admissions at [facility]. Facility can accepted patient. Patient  assigned room 404. Nurse will call report to [phone]  Contact patient's,son, [name] to notify of discharge to facility today.   Discharge summary sent in [place].   EMS arranged.   TOC signing off.           Barriers to Discharge: Continued Medical Work up   Patient Goals and CMS Choice     Choice offered to / list presented to : Patient  Discharge Placement                       Discharge Plan and Services     Post Acute Care Choice: Resumption of Svcs/PTA Provider                               Social Determinants of Health (SDOH) Interventions     Readmission Risk Interventions    12/05/2022    2:46 PM  Readmission Risk Prevention Plan  PCP or Specialist appointment within 3-5 days of discharge Complete  SW Recovery Care/Counseling Consult Complete  Palliative Care Screening Complete  Skilled Nursing Facility Complete

## 2022-12-12 DIAGNOSIS — M6281 Muscle weakness (generalized): Secondary | ICD-10-CM | POA: Diagnosis not present

## 2022-12-12 DIAGNOSIS — D3A090 Benign carcinoid tumor of the bronchus and lung: Secondary | ICD-10-CM | POA: Diagnosis not present

## 2022-12-12 DIAGNOSIS — E038 Other specified hypothyroidism: Secondary | ICD-10-CM | POA: Diagnosis not present

## 2022-12-12 DIAGNOSIS — R54 Age-related physical debility: Secondary | ICD-10-CM | POA: Diagnosis not present

## 2022-12-12 DIAGNOSIS — I480 Paroxysmal atrial fibrillation: Secondary | ICD-10-CM | POA: Diagnosis not present

## 2022-12-12 DIAGNOSIS — I110 Hypertensive heart disease with heart failure: Secondary | ICD-10-CM | POA: Diagnosis not present

## 2022-12-12 DIAGNOSIS — I2510 Atherosclerotic heart disease of native coronary artery without angina pectoris: Secondary | ICD-10-CM | POA: Diagnosis not present

## 2022-12-12 DIAGNOSIS — E1122 Type 2 diabetes mellitus with diabetic chronic kidney disease: Secondary | ICD-10-CM | POA: Diagnosis not present

## 2022-12-12 DIAGNOSIS — J9601 Acute respiratory failure with hypoxia: Secondary | ICD-10-CM | POA: Diagnosis not present

## 2022-12-12 DIAGNOSIS — I5033 Acute on chronic diastolic (congestive) heart failure: Secondary | ICD-10-CM | POA: Diagnosis not present

## 2022-12-15 DIAGNOSIS — N183 Chronic kidney disease, stage 3 unspecified: Secondary | ICD-10-CM | POA: Diagnosis not present

## 2022-12-15 DIAGNOSIS — E1122 Type 2 diabetes mellitus with diabetic chronic kidney disease: Secondary | ICD-10-CM | POA: Diagnosis not present

## 2022-12-15 DIAGNOSIS — I480 Paroxysmal atrial fibrillation: Secondary | ICD-10-CM | POA: Diagnosis not present

## 2022-12-15 DIAGNOSIS — J9601 Acute respiratory failure with hypoxia: Secondary | ICD-10-CM | POA: Diagnosis not present

## 2022-12-15 DIAGNOSIS — M6281 Muscle weakness (generalized): Secondary | ICD-10-CM | POA: Diagnosis not present

## 2022-12-15 DIAGNOSIS — I2510 Atherosclerotic heart disease of native coronary artery without angina pectoris: Secondary | ICD-10-CM | POA: Diagnosis not present

## 2022-12-15 DIAGNOSIS — I110 Hypertensive heart disease with heart failure: Secondary | ICD-10-CM | POA: Diagnosis not present

## 2022-12-15 DIAGNOSIS — E038 Other specified hypothyroidism: Secondary | ICD-10-CM | POA: Diagnosis not present

## 2022-12-19 DIAGNOSIS — R54 Age-related physical debility: Secondary | ICD-10-CM | POA: Diagnosis not present

## 2022-12-19 DIAGNOSIS — E038 Other specified hypothyroidism: Secondary | ICD-10-CM | POA: Diagnosis not present

## 2022-12-19 DIAGNOSIS — D3A090 Benign carcinoid tumor of the bronchus and lung: Secondary | ICD-10-CM | POA: Diagnosis not present

## 2022-12-19 DIAGNOSIS — M6281 Muscle weakness (generalized): Secondary | ICD-10-CM | POA: Diagnosis not present

## 2022-12-19 DIAGNOSIS — I2510 Atherosclerotic heart disease of native coronary artery without angina pectoris: Secondary | ICD-10-CM | POA: Diagnosis not present

## 2022-12-19 DIAGNOSIS — E1122 Type 2 diabetes mellitus with diabetic chronic kidney disease: Secondary | ICD-10-CM | POA: Diagnosis not present

## 2022-12-19 DIAGNOSIS — J9601 Acute respiratory failure with hypoxia: Secondary | ICD-10-CM | POA: Diagnosis not present

## 2022-12-19 DIAGNOSIS — I110 Hypertensive heart disease with heart failure: Secondary | ICD-10-CM | POA: Diagnosis not present

## 2022-12-19 DIAGNOSIS — I480 Paroxysmal atrial fibrillation: Secondary | ICD-10-CM | POA: Diagnosis not present

## 2022-12-25 DIAGNOSIS — Z9981 Dependence on supplemental oxygen: Secondary | ICD-10-CM | POA: Diagnosis not present

## 2022-12-25 DIAGNOSIS — J9621 Acute and chronic respiratory failure with hypoxia: Secondary | ICD-10-CM | POA: Diagnosis not present

## 2022-12-25 DIAGNOSIS — J90 Pleural effusion, not elsewhere classified: Secondary | ICD-10-CM | POA: Diagnosis not present

## 2022-12-25 DIAGNOSIS — C7A8 Other malignant neuroendocrine tumors: Secondary | ICD-10-CM | POA: Diagnosis not present

## 2022-12-30 DIAGNOSIS — R1312 Dysphagia, oropharyngeal phase: Secondary | ICD-10-CM | POA: Diagnosis not present

## 2022-12-30 DIAGNOSIS — Z7902 Long term (current) use of antithrombotics/antiplatelets: Secondary | ICD-10-CM | POA: Diagnosis not present

## 2022-12-30 DIAGNOSIS — E43 Unspecified severe protein-calorie malnutrition: Secondary | ICD-10-CM | POA: Diagnosis not present

## 2022-12-30 DIAGNOSIS — E871 Hypo-osmolality and hyponatremia: Secondary | ICD-10-CM | POA: Diagnosis not present

## 2022-12-30 DIAGNOSIS — D631 Anemia in chronic kidney disease: Secondary | ICD-10-CM | POA: Diagnosis not present

## 2022-12-30 DIAGNOSIS — C7A8 Other malignant neuroendocrine tumors: Secondary | ICD-10-CM | POA: Diagnosis not present

## 2022-12-30 DIAGNOSIS — N1832 Chronic kidney disease, stage 3b: Secondary | ICD-10-CM | POA: Diagnosis not present

## 2022-12-30 DIAGNOSIS — K219 Gastro-esophageal reflux disease without esophagitis: Secondary | ICD-10-CM | POA: Diagnosis not present

## 2022-12-30 DIAGNOSIS — I4821 Permanent atrial fibrillation: Secondary | ICD-10-CM | POA: Diagnosis not present

## 2022-12-30 DIAGNOSIS — E039 Hypothyroidism, unspecified: Secondary | ICD-10-CM | POA: Diagnosis not present

## 2022-12-30 DIAGNOSIS — E538 Deficiency of other specified B group vitamins: Secondary | ICD-10-CM | POA: Diagnosis not present

## 2022-12-30 DIAGNOSIS — J441 Chronic obstructive pulmonary disease with (acute) exacerbation: Secondary | ICD-10-CM | POA: Diagnosis not present

## 2022-12-30 DIAGNOSIS — I5033 Acute on chronic diastolic (congestive) heart failure: Secondary | ICD-10-CM | POA: Diagnosis not present

## 2022-12-30 DIAGNOSIS — I130 Hypertensive heart and chronic kidney disease with heart failure and stage 1 through stage 4 chronic kidney disease, or unspecified chronic kidney disease: Secondary | ICD-10-CM | POA: Diagnosis not present

## 2022-12-30 DIAGNOSIS — I2510 Atherosclerotic heart disease of native coronary artery without angina pectoris: Secondary | ICD-10-CM | POA: Diagnosis not present

## 2022-12-30 DIAGNOSIS — Z9981 Dependence on supplemental oxygen: Secondary | ICD-10-CM | POA: Diagnosis not present

## 2022-12-30 DIAGNOSIS — J9621 Acute and chronic respiratory failure with hypoxia: Secondary | ICD-10-CM | POA: Diagnosis not present

## 2022-12-30 DIAGNOSIS — G4700 Insomnia, unspecified: Secondary | ICD-10-CM | POA: Diagnosis not present

## 2022-12-30 DIAGNOSIS — I272 Pulmonary hypertension, unspecified: Secondary | ICD-10-CM | POA: Diagnosis not present

## 2022-12-30 DIAGNOSIS — E1122 Type 2 diabetes mellitus with diabetic chronic kidney disease: Secondary | ICD-10-CM | POA: Diagnosis not present

## 2023-01-01 DIAGNOSIS — Z9981 Dependence on supplemental oxygen: Secondary | ICD-10-CM | POA: Diagnosis not present

## 2023-01-01 DIAGNOSIS — I5032 Chronic diastolic (congestive) heart failure: Secondary | ICD-10-CM | POA: Diagnosis not present

## 2023-01-01 DIAGNOSIS — E119 Type 2 diabetes mellitus without complications: Secondary | ICD-10-CM | POA: Diagnosis not present

## 2023-01-01 DIAGNOSIS — I480 Paroxysmal atrial fibrillation: Secondary | ICD-10-CM | POA: Diagnosis not present

## 2023-01-01 DIAGNOSIS — E876 Hypokalemia: Secondary | ICD-10-CM | POA: Diagnosis not present

## 2023-01-01 DIAGNOSIS — R5381 Other malaise: Secondary | ICD-10-CM | POA: Diagnosis not present

## 2023-01-01 DIAGNOSIS — D491 Neoplasm of unspecified behavior of respiratory system: Secondary | ICD-10-CM | POA: Diagnosis not present

## 2023-01-01 DIAGNOSIS — R790 Abnormal level of blood mineral: Secondary | ICD-10-CM | POA: Diagnosis not present

## 2023-01-01 DIAGNOSIS — Z09 Encounter for follow-up examination after completed treatment for conditions other than malignant neoplasm: Secondary | ICD-10-CM | POA: Diagnosis not present

## 2023-01-03 DIAGNOSIS — Z9981 Dependence on supplemental oxygen: Secondary | ICD-10-CM | POA: Diagnosis not present

## 2023-01-03 DIAGNOSIS — E871 Hypo-osmolality and hyponatremia: Secondary | ICD-10-CM | POA: Diagnosis not present

## 2023-01-03 DIAGNOSIS — D631 Anemia in chronic kidney disease: Secondary | ICD-10-CM | POA: Diagnosis not present

## 2023-01-03 DIAGNOSIS — J9621 Acute and chronic respiratory failure with hypoxia: Secondary | ICD-10-CM | POA: Diagnosis not present

## 2023-01-03 DIAGNOSIS — G4700 Insomnia, unspecified: Secondary | ICD-10-CM | POA: Diagnosis not present

## 2023-01-03 DIAGNOSIS — I5033 Acute on chronic diastolic (congestive) heart failure: Secondary | ICD-10-CM | POA: Diagnosis not present

## 2023-01-03 DIAGNOSIS — I2510 Atherosclerotic heart disease of native coronary artery without angina pectoris: Secondary | ICD-10-CM | POA: Diagnosis not present

## 2023-01-03 DIAGNOSIS — R1312 Dysphagia, oropharyngeal phase: Secondary | ICD-10-CM | POA: Diagnosis not present

## 2023-01-03 DIAGNOSIS — Z7902 Long term (current) use of antithrombotics/antiplatelets: Secondary | ICD-10-CM | POA: Diagnosis not present

## 2023-01-03 DIAGNOSIS — E039 Hypothyroidism, unspecified: Secondary | ICD-10-CM | POA: Diagnosis not present

## 2023-01-03 DIAGNOSIS — I4821 Permanent atrial fibrillation: Secondary | ICD-10-CM | POA: Diagnosis not present

## 2023-01-03 DIAGNOSIS — K219 Gastro-esophageal reflux disease without esophagitis: Secondary | ICD-10-CM | POA: Diagnosis not present

## 2023-01-03 DIAGNOSIS — E43 Unspecified severe protein-calorie malnutrition: Secondary | ICD-10-CM | POA: Diagnosis not present

## 2023-01-03 DIAGNOSIS — E538 Deficiency of other specified B group vitamins: Secondary | ICD-10-CM | POA: Diagnosis not present

## 2023-01-03 DIAGNOSIS — E1122 Type 2 diabetes mellitus with diabetic chronic kidney disease: Secondary | ICD-10-CM | POA: Diagnosis not present

## 2023-01-03 DIAGNOSIS — I130 Hypertensive heart and chronic kidney disease with heart failure and stage 1 through stage 4 chronic kidney disease, or unspecified chronic kidney disease: Secondary | ICD-10-CM | POA: Diagnosis not present

## 2023-01-03 DIAGNOSIS — J441 Chronic obstructive pulmonary disease with (acute) exacerbation: Secondary | ICD-10-CM | POA: Diagnosis not present

## 2023-01-03 DIAGNOSIS — I272 Pulmonary hypertension, unspecified: Secondary | ICD-10-CM | POA: Diagnosis not present

## 2023-01-03 DIAGNOSIS — C7A8 Other malignant neuroendocrine tumors: Secondary | ICD-10-CM | POA: Diagnosis not present

## 2023-01-03 DIAGNOSIS — N1832 Chronic kidney disease, stage 3b: Secondary | ICD-10-CM | POA: Diagnosis not present

## 2023-01-06 DIAGNOSIS — E43 Unspecified severe protein-calorie malnutrition: Secondary | ICD-10-CM | POA: Diagnosis not present

## 2023-01-06 DIAGNOSIS — I5033 Acute on chronic diastolic (congestive) heart failure: Secondary | ICD-10-CM | POA: Diagnosis not present

## 2023-01-06 DIAGNOSIS — C7A8 Other malignant neuroendocrine tumors: Secondary | ICD-10-CM | POA: Diagnosis not present

## 2023-01-06 DIAGNOSIS — Z7902 Long term (current) use of antithrombotics/antiplatelets: Secondary | ICD-10-CM | POA: Diagnosis not present

## 2023-01-06 DIAGNOSIS — J9621 Acute and chronic respiratory failure with hypoxia: Secondary | ICD-10-CM | POA: Diagnosis not present

## 2023-01-06 DIAGNOSIS — G4700 Insomnia, unspecified: Secondary | ICD-10-CM | POA: Diagnosis not present

## 2023-01-06 DIAGNOSIS — E1122 Type 2 diabetes mellitus with diabetic chronic kidney disease: Secondary | ICD-10-CM | POA: Diagnosis not present

## 2023-01-06 DIAGNOSIS — K219 Gastro-esophageal reflux disease without esophagitis: Secondary | ICD-10-CM | POA: Diagnosis not present

## 2023-01-06 DIAGNOSIS — Z9981 Dependence on supplemental oxygen: Secondary | ICD-10-CM | POA: Diagnosis not present

## 2023-01-06 DIAGNOSIS — E871 Hypo-osmolality and hyponatremia: Secondary | ICD-10-CM | POA: Diagnosis not present

## 2023-01-06 DIAGNOSIS — R1312 Dysphagia, oropharyngeal phase: Secondary | ICD-10-CM | POA: Diagnosis not present

## 2023-01-06 DIAGNOSIS — E538 Deficiency of other specified B group vitamins: Secondary | ICD-10-CM | POA: Diagnosis not present

## 2023-01-06 DIAGNOSIS — I272 Pulmonary hypertension, unspecified: Secondary | ICD-10-CM | POA: Diagnosis not present

## 2023-01-06 DIAGNOSIS — I2510 Atherosclerotic heart disease of native coronary artery without angina pectoris: Secondary | ICD-10-CM | POA: Diagnosis not present

## 2023-01-06 DIAGNOSIS — N1832 Chronic kidney disease, stage 3b: Secondary | ICD-10-CM | POA: Diagnosis not present

## 2023-01-06 DIAGNOSIS — E039 Hypothyroidism, unspecified: Secondary | ICD-10-CM | POA: Diagnosis not present

## 2023-01-06 DIAGNOSIS — D631 Anemia in chronic kidney disease: Secondary | ICD-10-CM | POA: Diagnosis not present

## 2023-01-06 DIAGNOSIS — I4821 Permanent atrial fibrillation: Secondary | ICD-10-CM | POA: Diagnosis not present

## 2023-01-06 DIAGNOSIS — I130 Hypertensive heart and chronic kidney disease with heart failure and stage 1 through stage 4 chronic kidney disease, or unspecified chronic kidney disease: Secondary | ICD-10-CM | POA: Diagnosis not present

## 2023-01-06 DIAGNOSIS — J441 Chronic obstructive pulmonary disease with (acute) exacerbation: Secondary | ICD-10-CM | POA: Diagnosis not present

## 2023-01-07 ENCOUNTER — Ambulatory Visit: Payer: Medicare Other | Admitting: Pulmonary Disease

## 2023-01-07 ENCOUNTER — Ambulatory Visit (INDEPENDENT_AMBULATORY_CARE_PROVIDER_SITE_OTHER): Payer: Medicare Other

## 2023-01-07 ENCOUNTER — Encounter: Payer: Self-pay | Admitting: Pulmonary Disease

## 2023-01-07 VITALS — BP 116/72 | HR 88 | Ht 62.0 in | Wt 123.0 lb

## 2023-01-07 DIAGNOSIS — I2510 Atherosclerotic heart disease of native coronary artery without angina pectoris: Secondary | ICD-10-CM | POA: Diagnosis not present

## 2023-01-07 DIAGNOSIS — C7A8 Other malignant neuroendocrine tumors: Secondary | ICD-10-CM | POA: Diagnosis not present

## 2023-01-07 DIAGNOSIS — R1312 Dysphagia, oropharyngeal phase: Secondary | ICD-10-CM | POA: Diagnosis not present

## 2023-01-07 DIAGNOSIS — Z7902 Long term (current) use of antithrombotics/antiplatelets: Secondary | ICD-10-CM | POA: Diagnosis not present

## 2023-01-07 DIAGNOSIS — H9201 Otalgia, right ear: Secondary | ICD-10-CM

## 2023-01-07 DIAGNOSIS — J9621 Acute and chronic respiratory failure with hypoxia: Secondary | ICD-10-CM | POA: Diagnosis not present

## 2023-01-07 DIAGNOSIS — E43 Unspecified severe protein-calorie malnutrition: Secondary | ICD-10-CM | POA: Diagnosis not present

## 2023-01-07 DIAGNOSIS — I130 Hypertensive heart and chronic kidney disease with heart failure and stage 1 through stage 4 chronic kidney disease, or unspecified chronic kidney disease: Secondary | ICD-10-CM | POA: Diagnosis not present

## 2023-01-07 DIAGNOSIS — Z9981 Dependence on supplemental oxygen: Secondary | ICD-10-CM | POA: Diagnosis not present

## 2023-01-07 DIAGNOSIS — E871 Hypo-osmolality and hyponatremia: Secondary | ICD-10-CM | POA: Diagnosis not present

## 2023-01-07 DIAGNOSIS — I5033 Acute on chronic diastolic (congestive) heart failure: Secondary | ICD-10-CM | POA: Diagnosis not present

## 2023-01-07 DIAGNOSIS — D631 Anemia in chronic kidney disease: Secondary | ICD-10-CM | POA: Diagnosis not present

## 2023-01-07 DIAGNOSIS — D3A090 Benign carcinoid tumor of the bronchus and lung: Secondary | ICD-10-CM

## 2023-01-07 DIAGNOSIS — E1122 Type 2 diabetes mellitus with diabetic chronic kidney disease: Secondary | ICD-10-CM | POA: Diagnosis not present

## 2023-01-07 DIAGNOSIS — K219 Gastro-esophageal reflux disease without esophagitis: Secondary | ICD-10-CM | POA: Diagnosis not present

## 2023-01-07 DIAGNOSIS — J441 Chronic obstructive pulmonary disease with (acute) exacerbation: Secondary | ICD-10-CM | POA: Diagnosis not present

## 2023-01-07 DIAGNOSIS — J90 Pleural effusion, not elsewhere classified: Secondary | ICD-10-CM

## 2023-01-07 DIAGNOSIS — I4821 Permanent atrial fibrillation: Secondary | ICD-10-CM | POA: Diagnosis not present

## 2023-01-07 DIAGNOSIS — N1832 Chronic kidney disease, stage 3b: Secondary | ICD-10-CM | POA: Diagnosis not present

## 2023-01-07 DIAGNOSIS — G4700 Insomnia, unspecified: Secondary | ICD-10-CM | POA: Diagnosis not present

## 2023-01-07 DIAGNOSIS — E538 Deficiency of other specified B group vitamins: Secondary | ICD-10-CM | POA: Diagnosis not present

## 2023-01-07 DIAGNOSIS — J9611 Chronic respiratory failure with hypoxia: Secondary | ICD-10-CM | POA: Diagnosis not present

## 2023-01-07 DIAGNOSIS — I272 Pulmonary hypertension, unspecified: Secondary | ICD-10-CM | POA: Diagnosis not present

## 2023-01-07 DIAGNOSIS — I5040 Unspecified combined systolic (congestive) and diastolic (congestive) heart failure: Secondary | ICD-10-CM | POA: Diagnosis not present

## 2023-01-07 DIAGNOSIS — E039 Hypothyroidism, unspecified: Secondary | ICD-10-CM | POA: Diagnosis not present

## 2023-01-07 NOTE — Patient Instructions (Addendum)
Continue stiolto inhaler 2 puffs daily  Use pulmicort nebulizer treatment morning and evening time.  We will get you scheduled with the ENT team to have the right ear evaluated  We will check a chest x-ray today to evaluated the fluid around your lungs  Follow up in 6 months

## 2023-01-07 NOTE — Progress Notes (Signed)
Synopsis: Referred in January 2023 for dyspnea on exertion by Dr. Tamsen Roers, MD  Subjective:   PATIENT ID: [name] GENDER: female DOB: 1934-02-22, MRN: [id number]  HPI  Chief Complaint  Patient presents with   Follow-up    F/U after being discharged from rehab after last month's hospital stay.    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   She was admitted 11/11/22 to 11/26/22 and again 12/01/22 to 12/09/22 for acute hypoxemic respiratory failure in setting of volume overload. She was discharged on 2L of O2. She was treated for possible hospital acquired pneumonia. Her viral testing was negative.   She is out of rehab now. She was started on pulmicort nebulizer treatments and is using stiolto 1 puff daily. She has been doing ok. She is not walking a lot. Her son is with her.   OV 08/26/22 She continues to have improvement in her breathing with stiolto. We reviewed how to use the inhaler today.   She complains of watery eyes and nose. She has issues with cough, and spitting up phlegm at night. She has issues with GERD and is taking TUMs as needed. She is sleeping upright in recliner at night and is eating well before bedtime.   OV 02/27/22 She reports improvement in her breathing with stiolto since last visit. She is having a productive cough.   PFTs today show moderate diffusion defect.   OV 01/24/22 She reports progressive shortness of breath over the past year. She is followed by Dr. Einar Gip of cardiology, note from 12/12/21 reviewed, with low concern for heart failure leading to her dyspnea. She uses a walker to ambulate and is now short of breath walking short distances. She denies wheezing with the shortness of breath. She does have a cough with sputum production that can be  thick/stringy at times. She was previously using nocturnal oxygen per Dr. Maryjean Ka but self-discontinued this due to rising costs.   She is a never smoker but does have second hand smoke exposure from her father. She is accompanied by her son [name] today. She has not tried any inhalers for her shortness of breath. She lives alone. Denies history of occupational dust or chemical exposures.  She has history of GERD which is currently active with symptoms. She was previously on pepcid for acid suppression which she tolerated well.  Note from [facility]. Sharlet Salina at [facility] 02/07/2013: She had a chronic intermittent cough for three to four years with some dyspnea on exertion. A chest x-ray showed small nodules in both lungs. Subsequent CT scan was performed on 11-06-03, and this showed multiple subcentimeter and 1.0 to 2.0 cm nodules with two in the right middle lobe, three in the right lower lobe measuring roughly 1.8 cm and three nodules on the left side as well. All total there were almost a dozen nodules in both lungs combined. There were no other signs of disease within the chest, abdomen or pelvis. CT guided biopsy of the largest lesion in the right lower lobe was performed on 09-25-03. This caused hemoptysis which quickly resolved. Pathology returned as an epithelial tumor with neuroendocrine features but could not differentiate between atypical carcinoid or typical carcinoid . Her pathology was reviewed here and felt to be a low grade neuroendocrine neoplasm favoring carcinoid tumor ,TTF1 + . She  has been followed without treatment with stable nodules.  Past Medical History:  Diagnosis Date   Acute diverticulitis 06/2017   Anemia 06/2017   CAD (coronary artery disease) 03/13/2012   Diabetes mellitus    Dysrhythmia    ATRIAL FIB   Fall 07/07/2017   HTN (hypertension) 03/13/2012   Hypothyroid 03/13/2012   Lung cancer (HCC)    Shortness of breath    Stroke ([place])    Syncope and collapse  04/02/2016   Type II or unspecified type diabetes mellitus without mention of complication, not stated as uncontrolled 03/13/2012     Family History  Problem Relation Age of Onset   Breast cancer Mother    Aneurysm Father        Likely thoracic aneurysm per patient   Hypertension Sister    Heart disease Sister    Diabetes type II Sister    Diabetes type II Sister    Diverticulitis Sister    Diabetes type II Sister    Stroke Sister    Heart disease Brother    Melanoma Brother    Melanoma Brother      Social History   Socioeconomic History   Marital status: Widowed    Spouse name: Not on file   Number of children: 3   Years of education: Not on file   Highest education level: Not on file  Occupational History   Not on file  Tobacco Use   Smoking status: Never   Smokeless tobacco: Never  Vaping Use   Vaping Use: Never used  Substance and Sexual Activity   Alcohol use: No   Drug use: No   Sexual activity: Yes    Birth control/protection: None  Other Topics Concern   Not on file  Social History Narrative   Not on file   Social Determinants of Health   Financial Resource Strain: Not on file  Food Insecurity: No Food Insecurity (12/09/2022)   Hunger Vital Sign    Worried About Running Out of Food in the Last Year: Never true    Ran Out of Food in the Last Year: Never true  Transportation Needs: No Transportation Needs (12/02/2022)   PRAPARE - Hydrologist (Medical): No    Lack of Transportation (Non-Medical): No  Physical Activity: Not on file  Stress: Not on file  Social Connections: Not on file  Intimate Partner Violence: Not At Risk (12/02/2022)   Humiliation, Afraid, Rape, and Kick questionnaire    Fear of Current or Ex-Partner: No    Emotionally Abused: No    Physically Abused: No    Sexually Abused: No     Allergies  Allergen Reactions   Codeine Nausea And Vomiting   Hydrocodone-Acetaminophen Nausea And Vomiting      Outpatient Medications Prior to Visit  Medication Sig Dispense Refill   acetaminophen (TYLENOL) 325 MG tablet Take 2 tablets (650 mg total) by mouth every 6 (six) hours as needed for mild pain, fever or headache.     apixaban (ELIQUIS) 2.5 MG TABS tablet Take 1 tablet (2.5 mg total) by mouth 2 (two) times daily. 60 tablet    budesonide (PULMICORT) 0.5 MG/2ML nebulizer solution Take 2 mLs (0.5 mg total) by nebulization 2 (two) times daily.  12   Calcium Citrate-Vitamin D ([employer] CALCIUM CITRATE +D3 MINI PO) Take 1 capsule by mouth daily.     diltiazem (CARDIZEM CD) 120 MG 24 hr capsule Take 1 capsule (120 mg total) by mouth daily.  feeding supplement (ENSURE ENLIVE / ENSURE PLUS) LIQD Take 237 mLs by mouth 2 (two) times daily between meals. 237 mL 12   Fluocinolone Acetonide 0.01 % OIL Place 5 drops in ear(s) daily.     furosemide (LASIX) 20 MG tablet Take 1 tablet (20 mg total) by mouth daily. 30 tablet 11   insulin aspart (NOVOLOG) 100 UNIT/ML injection Inject 0-9 Units into the skin 3 (three) times daily with meals. 10 mL 11   insulin glargine-yfgn (SEMGLEE) 100 UNIT/ML injection Inject 0.25 mLs (25 Units total) into the skin daily. 10 mL 0   Insulin Pen Needle (NOVOFINE PLUS PEN NEEDLE) 32G X 4 MM MISC ONE     ipratropium-albuterol (DUONEB) 0.5-2.5 (3) MG/3ML SOLN Take 3 mLs by nebulization every 6 (six) hours as needed. 360 mL    lactose free nutrition (BOOST) LIQD Take 237 mLs by mouth daily.     levothyroxine (SYNTHROID) 88 MCG tablet Take 88 mcg by mouth daily.     metoprolol succinate (TOPROL-XL) 25 MG 24 hr tablet Take 1 tablet (25 mg total) by mouth daily.     Multiple Vitamin (MULTIVITAMIN WITH MINERALS) TABS tablet Take 1 tablet by mouth daily.     ondansetron (ZOFRAN) 4 MG tablet Take 1 tablet (4 mg total) by mouth every 8 (eight) hours as needed for nausea or vomiting. 20 tablet 0   polyethylene glycol (MIRALAX / GLYCOLAX) 17 g packet Take 17 g by mouth daily as needed for mild  constipation. 14 each 0   Skin Protectants, Misc. (EUCERIN) cream Apply 1 Application topically 2 (two) times daily.     traZODone (DESYREL) 50 MG tablet Take 1 tablet (50 mg total) by mouth at bedtime as needed for sleep.     Tiotropium Bromide-Olodaterol (STIOLTO RESPIMAT) 2.5-2.5 MCG/ACT AERS Inhale 2 puffs into the lungs daily. 4 g 0   No facility-administered medications prior to visit.   Review of Systems  Constitutional:  Negative for chills, fever, malaise/fatigue and weight loss.  HENT:  Negative for congestion, sinus pain and sore throat.   Eyes: Negative.   Respiratory:  Positive for shortness of breath. Negative for cough, hemoptysis, sputum production and wheezing.   Cardiovascular:  Negative for chest pain, palpitations, orthopnea, claudication and leg swelling.  Gastrointestinal:  Negative for abdominal pain, heartburn, nausea and vomiting.  Genitourinary: Negative.   Musculoskeletal:  Negative for joint pain and myalgias.  Skin:  Negative for rash.  Neurological:  Positive for weakness (generalized).  Endo/Heme/Allergies: Negative.   Psychiatric/Behavioral: Negative.     Objective:   Vitals:   01/07/23 0842  BP: 116/72  Pulse: 88  SpO2: 100%  Weight: 123 lb (55.8 kg)  Height: 5\' 2"  (1.575 m)   Physical Exam Constitutional:      General: She is not in acute distress.    Appearance: She is not ill-appearing.  HENT:     Head: Normocephalic and atraumatic.     Left Ear: There is impacted cerumen (right).  Eyes:     General: No scleral icterus.    Conjunctiva/sclera: Conjunctivae normal.  Cardiovascular:     Rate and Rhythm: Normal rate and regular rhythm.     Pulses: Normal pulses.     Heart sounds: Normal heart sounds. No murmur heard. Pulmonary:     Effort: Pulmonary effort is normal.     Breath sounds: Normal breath sounds. Decreased air movement present. No wheezing, rhonchi or rales.  Musculoskeletal:     Right lower leg: No edema.  Left lower  leg: No edema.  Skin:    General: Skin is warm and dry.  Neurological:     General: No focal deficit present.     Mental Status: She is alert.    CBC    Component Value Date/Time   WBC 10.8 (H) 12/09/2022 0205   RBC 4.08 12/09/2022 0205   HGB 11.1 (L) 12/09/2022 0205   HGB 11.7 09/17/2021 1316   HCT 34.1 (L) 12/09/2022 0205   HCT 36.7 09/17/2021 1316   PLT 133 (L) 12/09/2022 0205   PLT 128 (L) 09/17/2021 1316   MCV 83.6 12/09/2022 0205   MCV 86 09/17/2021 1316   MCH 27.2 12/09/2022 0205   MCHC 32.6 12/09/2022 0205   RDW 14.1 12/09/2022 0205   RDW 16.0 (H) 09/17/2021 1316   LYMPHSABS 1.0 12/02/2022 0433   MONOABS 1.2 (H) 12/02/2022 0433   EOSABS 0.0 12/02/2022 0433   BASOSABS 0.0 12/02/2022 0433      Latest Ref Rng & Units 12/09/2022    2:05 AM 12/08/2022    4:56 AM 12/07/2022    6:13 AM  BMP  Glucose 70 - 99 mg/dL 161  167  240   BUN 8 - 23 mg/dL 80  77  72   Creatinine 0.44 - 1.00 mg/dL 1.77  1.79  1.76   Sodium 135 - 145 mmol/L 132  136  133   Potassium 3.5 - 5.1 mmol/L 3.5  3.5  3.8   Chloride 98 - 111 mmol/L 89  94  93   CO2 22 - 32 mmol/L 32  30  32   Calcium 8.9 - 10.3 mg/dL 8.6  8.8  8.6    Chest imaging: CXR 12/30/21 The heart size and mediastinal contours are within normal limits. Nodular density is noted medially in right lung base consistent with metastatic disease as noted on recent CT scan. No acute pulmonary consolidation is noted. The visualized skeletal structures are unremarkable.  PFT:    Latest Ref Rng & Units 02/27/2022    9:40 AM  PFT Results  FVC-Pre L 1.54   FVC-Predicted Pre % 71   FVC-Post L 1.51   FVC-Predicted Post % 70   Pre FEV1/FVC % % 75   Post FEV1/FCV % % 77   FEV1-Pre L 1.15   FEV1-Predicted Pre % 73   FEV1-Post L 1.17   DLCO uncorrected ml/min/mmHg 9.34   DLCO UNC% % 54   DLCO corrected ml/min/mmHg 9.34   DLCO COR %Predicted % 54   DLVA Predicted % 64   TLC L 4.33   TLC % Predicted % 91   RV % Predicted % 102      Labs:  Path:  Echo: 01/15/22 LV EF 59%. Moderate aortic stenosis. Mild aortic regurge, mild mitral regurge. Mild tricuspid regurge. PASP 7mmHg.   Heart Catheterization 05/2016: Coronary angiogram: Mild to moderate diffuse coronary artery disease without high-grade stenosis. Proximal LAD with a 40-50% stenosis, in some views at most 50-60% stenosed and calcified. Small circumflex, mildly diseased large right coronary artery.   Aortic valve not crossed. Difficulty in crossing. Patient has history of mild aortic stenosis by echocardiogram April 2017.   Right heart catheterization revealing mild pulmonary hypertension, PA mean pressure of 26 mmHg, wedge mean was 13 mmHg. Cardiac output 3.7, cardiac index 2.3 by Fick. There was no significant shunt, QP: QS 0.79. RA mean 3 mmHg, RA saturation 59%.; RV 34/0, EDP 3 mmHg.; PTA 36/17, mean 26 mmHg. PA saturation 49%.; Pulmonary  wedge mean of 13 mmHg.   Recommendation: Patient main complaint is dyspnea, consideration for pulmonary vasodilator therapy for pulmonary hypertension may be given in view of low wedge pressure and high PA mean pressure. Patient will be discharged home with outpatient follow-up.  Assessment & Plan:   No diagnosis found.  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   She has developed chronic hypoxemic respiratory failure in setting of heart failure. She remains on 2L O2. She has bilateral pleural effusion on chest imaging and were reported as simple based on [place] in the hospital. We will repeat CXR today.  She has nonspecific PFT pattern with moderate diffusion defect. She is being treated for obstructive lung disease with Stiolto and recently started on pulmicort nebs. She is to continue this regimen.   She has right  ear pain with impacted cerumen. Instructed to have her PCP or her ENT team evaluate her ear further and cerumen removal.   Follow up in 6 months.   Freda Jackson, MD [facility] Office: [phone]   Current Outpatient Medications:    acetaminophen (TYLENOL) 325 MG tablet, Take 2 tablets (650 mg total) by mouth every 6 (six) hours as needed for mild pain, fever or headache., Disp: , Rfl:    apixaban (ELIQUIS) 2.5 MG TABS tablet, Take 1 tablet (2.5 mg total) by mouth 2 (two) times daily., Disp: 60 tablet, Rfl:    budesonide (PULMICORT) 0.5 MG/2ML nebulizer solution, Take 2 mLs (0.5 mg total) by nebulization 2 (two) times daily., Disp: , Rfl: 12   Calcium Citrate-Vitamin D ([employer]S CALCIUM CITRATE +D3 MINI PO), Take 1 capsule by mouth daily., Disp: , Rfl:    diltiazem (CARDIZEM CD) 120 MG 24 hr capsule, Take 1 capsule (120 mg total) by mouth daily., Disp: , Rfl:    feeding supplement (ENSURE ENLIVE / ENSURE PLUS) LIQD, Take 237 mLs by mouth 2 (two) times daily between meals., Disp: 237 mL, Rfl: 12   Fluocinolone Acetonide 0.01 % OIL, Place 5 drops in ear(s) daily., Disp: , Rfl:    furosemide (LASIX) 20 MG tablet, Take 1 tablet (20 mg total) by mouth daily., Disp: 30 tablet, Rfl: 11   insulin aspart (NOVOLOG) 100 UNIT/ML injection, Inject 0-9 Units into the skin 3 (three) times daily with meals., Disp: 10 mL, Rfl: 11   insulin glargine-yfgn (SEMGLEE) 100 UNIT/ML injection, Inject 0.25 mLs (25 Units total) into the skin daily., Disp: 10 mL, Rfl: 0   Insulin Pen Needle (NOVOFINE PLUS PEN NEEDLE) 32G X 4 MM MISC, ONE, Disp: , Rfl:    ipratropium-albuterol (DUONEB) 0.5-2.5 (3) MG/3ML SOLN, Take 3 mLs by nebulization every 6 (six) hours as needed., Disp: 360 mL, Rfl:    lactose free nutrition (BOOST) LIQD, Take 237 mLs by mouth daily., Disp: , Rfl:    levothyroxine (SYNTHROID) 88 MCG tablet, Take 88 mcg by mouth daily., Disp: , Rfl:    metoprolol succinate (TOPROL-XL) 25 MG 24 hr  tablet, Take 1 tablet (25 mg total) by mouth daily., Disp: , Rfl:    Multiple Vitamin (MULTIVITAMIN WITH MINERALS) TABS tablet, Take 1 tablet by mouth daily., Disp: , Rfl:    ondansetron (ZOFRAN) 4 MG tablet, Take 1 tablet (4 mg total) by mouth every 8 (eight) hours as needed for nausea or vomiting., Disp: 20  tablet, Rfl: 0   polyethylene glycol (MIRALAX / GLYCOLAX) 17 g packet, Take 17 g by mouth daily as needed for mild constipation., Disp: 14 each, Rfl: 0   Skin Protectants, Misc. (EUCERIN) cream, Apply 1 Application topically 2 (two) times daily., Disp: , Rfl:    traZODone (DESYREL) 50 MG tablet, Take 1 tablet (50 mg total) by mouth at bedtime as needed for sleep., Disp: , Rfl:

## 2023-01-07 NOTE — Addendum Note (Signed)
Addended by: Valerie Salts on: 01/07/2023 09:47 AM   Modules accepted: Orders

## 2023-01-09 ENCOUNTER — Other Ambulatory Visit: Payer: Self-pay | Admitting: Nurse Practitioner

## 2023-01-09 DIAGNOSIS — E538 Deficiency of other specified B group vitamins: Secondary | ICD-10-CM | POA: Diagnosis not present

## 2023-01-09 DIAGNOSIS — I5033 Acute on chronic diastolic (congestive) heart failure: Secondary | ICD-10-CM | POA: Diagnosis not present

## 2023-01-09 DIAGNOSIS — K219 Gastro-esophageal reflux disease without esophagitis: Secondary | ICD-10-CM | POA: Diagnosis not present

## 2023-01-09 DIAGNOSIS — N1832 Chronic kidney disease, stage 3b: Secondary | ICD-10-CM | POA: Diagnosis not present

## 2023-01-09 DIAGNOSIS — C7A8 Other malignant neuroendocrine tumors: Secondary | ICD-10-CM | POA: Diagnosis not present

## 2023-01-09 DIAGNOSIS — E042 Nontoxic multinodular goiter: Secondary | ICD-10-CM

## 2023-01-09 DIAGNOSIS — E039 Hypothyroidism, unspecified: Secondary | ICD-10-CM | POA: Diagnosis not present

## 2023-01-09 DIAGNOSIS — E871 Hypo-osmolality and hyponatremia: Secondary | ICD-10-CM | POA: Diagnosis not present

## 2023-01-09 DIAGNOSIS — D631 Anemia in chronic kidney disease: Secondary | ICD-10-CM | POA: Diagnosis not present

## 2023-01-09 DIAGNOSIS — I272 Pulmonary hypertension, unspecified: Secondary | ICD-10-CM | POA: Diagnosis not present

## 2023-01-09 DIAGNOSIS — Z9981 Dependence on supplemental oxygen: Secondary | ICD-10-CM | POA: Diagnosis not present

## 2023-01-09 DIAGNOSIS — E1165 Type 2 diabetes mellitus with hyperglycemia: Secondary | ICD-10-CM | POA: Diagnosis not present

## 2023-01-09 DIAGNOSIS — E43 Unspecified severe protein-calorie malnutrition: Secondary | ICD-10-CM | POA: Diagnosis not present

## 2023-01-09 DIAGNOSIS — J9621 Acute and chronic respiratory failure with hypoxia: Secondary | ICD-10-CM | POA: Diagnosis not present

## 2023-01-09 DIAGNOSIS — Z7902 Long term (current) use of antithrombotics/antiplatelets: Secondary | ICD-10-CM | POA: Diagnosis not present

## 2023-01-09 DIAGNOSIS — I2510 Atherosclerotic heart disease of native coronary artery without angina pectoris: Secondary | ICD-10-CM | POA: Diagnosis not present

## 2023-01-09 DIAGNOSIS — G4700 Insomnia, unspecified: Secondary | ICD-10-CM | POA: Diagnosis not present

## 2023-01-09 DIAGNOSIS — R1312 Dysphagia, oropharyngeal phase: Secondary | ICD-10-CM | POA: Diagnosis not present

## 2023-01-09 DIAGNOSIS — J441 Chronic obstructive pulmonary disease with (acute) exacerbation: Secondary | ICD-10-CM | POA: Diagnosis not present

## 2023-01-09 DIAGNOSIS — E1122 Type 2 diabetes mellitus with diabetic chronic kidney disease: Secondary | ICD-10-CM | POA: Diagnosis not present

## 2023-01-09 DIAGNOSIS — I130 Hypertensive heart and chronic kidney disease with heart failure and stage 1 through stage 4 chronic kidney disease, or unspecified chronic kidney disease: Secondary | ICD-10-CM | POA: Diagnosis not present

## 2023-01-09 DIAGNOSIS — E1169 Type 2 diabetes mellitus with other specified complication: Secondary | ICD-10-CM | POA: Diagnosis not present

## 2023-01-09 DIAGNOSIS — I4821 Permanent atrial fibrillation: Secondary | ICD-10-CM | POA: Diagnosis not present

## 2023-01-12 ENCOUNTER — Ambulatory Visit: Payer: Medicare Other | Admitting: Cardiology

## 2023-01-12 ENCOUNTER — Encounter: Payer: Self-pay | Admitting: Cardiology

## 2023-01-12 VITALS — BP 121/75 | HR 100 | Resp 16 | Ht 62.0 in | Wt 124.4 lb

## 2023-01-12 DIAGNOSIS — I350 Nonrheumatic aortic (valve) stenosis: Secondary | ICD-10-CM

## 2023-01-12 DIAGNOSIS — Z7189 Other specified counseling: Secondary | ICD-10-CM | POA: Diagnosis not present

## 2023-01-12 DIAGNOSIS — I5032 Chronic diastolic (congestive) heart failure: Secondary | ICD-10-CM

## 2023-01-12 DIAGNOSIS — I10 Essential (primary) hypertension: Secondary | ICD-10-CM

## 2023-01-12 DIAGNOSIS — I4821 Permanent atrial fibrillation: Secondary | ICD-10-CM

## 2023-01-12 MED ORDER — APIXABAN 2.5 MG PO TABS: 2.5000 mg | ORAL_TABLET | Freq: Two times a day (BID) | ORAL | 3 refills | Status: DC

## 2023-01-12 MED ORDER — METOPROLOL SUCCINATE ER 100 MG PO TB24: 100.0000 mg | ORAL_TABLET | Freq: Every day | ORAL | 1 refills | Status: AC

## 2023-01-12 NOTE — Progress Notes (Signed)
Primary Physician/Referring:  Fatima Sanger, FNP  Patient ID: [name], female    DOB: 03/06/1934, 89 y.o.   MRN: [id number]  Chief Complaint  Patient presents with   Permanent atrial fibrillation (   Coronary Artery Disease   Follow-up   HPI: [name]  is a 89 y.o. Caucasian female patient with rare form of low-grade lung carcinoid, severe esophageal dysmotility, permanent atrial fibrillation, moderate diffuse coronary artery disease, hypercholesterolemia, chronic diastolic heart failure, moderate aortic stenosis, asymptomatic right subclavian artery stenosis, hypertension, type II diabetes mellitus with stage IIIb chronic kidney disease, sick euthyroid syndrome presents here for follow-up of chronic diastolic heart failure and atrial fibrillation.    Patient admitted to the hospital on on 11/11/2022 and discharged on 11/26/2022 with acute hypoxemic respiratory failure, acute on chronic diastolic heart failure, A-fib with RVR, hyponatremia and was discharged to rehab.  Unfortunately was readmitted to the hospital on 12/01/2022 with acute hypoxemic respiratory failure again fluid overload state and hospital-acquired pneumonia and eventually again discharged home, presents for follow-up of heart failure.  Since hospitalization, she is now also being treated for COPD.  Hypoxemic respiratory failure felt to be primarily due to acute on chronic diastolic heart failure.  She is accompanied by her son Jeannett Senior, no 2 of her sons have taken all her medications.  In spite of this Eliquis was missing.  Patient states that her dyspnea is improved.  She feels well with 24 7 oxygen, she has not had any further PND or leg edema.  Dyspnea has remained stable, no chest pain or palpitations, no dizziness or syncope.  Past Medical History:  Diagnosis Date   Acute diverticulitis 06/2017   Anemia 06/2017   CAD (coronary artery disease) 03/13/2012   Diabetes mellitus    Dysrhythmia    ATRIAL  FIB   Fall 07/07/2017   HTN (hypertension) 03/13/2012   Hypothyroid 03/13/2012   Lung cancer (HCC)    Shortness of breath    Stroke (HCC)    Syncope and collapse 04/02/2016   Type II or unspecified type diabetes mellitus without mention of complication, not stated as uncontrolled 03/13/2012   Past Surgical History:  Procedure Laterality Date   ABDOMINAL HYSTERECTOMY     BIOPSY  06/13/2021   Procedure: BIOPSY;  Surgeon: Kerin Salen, MD;  Location: [facility];  Service: Gastroenterology;;   BREAST EXCISIONAL BIOPSY Left    BREAST SURGERY     benign tumors removed in 1980   CARDIAC CATHETERIZATION     CARDIOVERSION  04/20/2012   Procedure: CARDIOVERSION;  Surgeon: Pamella Pert, MD;  Location: [facility] OR;  Service: Cardiovascular;  Laterality: N/A;   CARDIOVERSION N/A 05/10/2013   Procedure: CARDIOVERSION;  Surgeon: Pamella Pert, MD;  Location: [facility] ENDOSCOPY;  Service: Cardiovascular;  Laterality: N/A;   CHOLECYSTECTOMY     COLONOSCOPY WITH PROPOFOL N/A 06/13/2021   Procedure: COLONOSCOPY WITH PROPOFOL;  Surgeon: Kerin Salen, MD;  Location: [facility]OS[facility];  Service: Gastroenterology;  Laterality: N/A;   ESOPHAGOGASTRODUODENOSCOPY (EGD) WITH PROPOFOL N/A 06/13/2021   Procedure: ESOPHAGOGASTRODUODENOSCOPY (EGD) WITH PROPOFOL;  Surgeon: Kerin Salen, MD;  Location: [facility];  Service: Gastroenterology;  Laterality: N/A;   HEMORRHOID SURGERY     jaw replacement     LEFT HEART CATHETERIZATION WITH CORONARY ANGIOGRAM N/A 04/20/2015   Procedure: LEFT HEART CATHETERIZATION WITH CORONARY ANGIOGRAM;  Surgeon: Yates Decamp, MD;  Location: [facility] CATH LAB;  Service: Cardiovascular;  Laterality: N/A;   tumors     back of  head   Social History   Tobacco Use   Smoking status: Never   Smokeless tobacco: Never  Substance Use Topics   Alcohol use: No    Review of Systems  Cardiovascular:  Positive for dyspnea on exertion (chronic, stable). Negative for chest pain and leg swelling.  Musculoskeletal:   Positive for back pain and falls.  Gastrointestinal:  Negative for hematochezia and melena.   Objective  Blood pressure 121/75, pulse 100, resp. rate 16, height 5\' 2"  (1.575 m), weight 124 lb 6.4 oz (56.4 kg), SpO2 96 %. Body mass index is 22.75 kg/m.      01/12/2023   10:01 AM 01/07/2023    8:42 AM 12/09/2022    3:45 PM  Vitals with BMI  Height 5\' 2"  5\' 2"    Weight 124 lbs 6 oz 123 lbs   BMI 22.75 22.49   Systolic 121 116 14/11/2022  Diastolic 75 72 79  Pulse 100 88 108    Orthostatic VS for the past 72 hrs (Last 3 readings):  Patient Position BP Location Cuff Size  01/12/23 1001 Sitting Left Arm Normal    Physical Exam Vitals reviewed.  Constitutional:      Comments: Moderately built  Neck:     Vascular: Carotid bruit (left carotid) present. No JVD.  Cardiovascular:     Rate and Rhythm: Tachycardia present. Rhythm irregular.     Pulses:          Dorsalis pedis pulses are 1+ on the right side and 2+ on the left side.       Posterior tibial pulses are 0 on the right side and 0 on the left side.     Heart sounds: S1 normal and S2 normal. Heart sounds are distant. Murmur heard.     High-pitched midsystolic murmur is present with a grade of 2/6 at the upper right sternal border and apex.     No gallop. No S3 or S4 sounds.  Pulmonary:     Effort: Pulmonary effort is normal. No respiratory distress.     Breath sounds: Rales (scattered bilateral) present. No wheezing (bilateral bases) or rhonchi.  Musculoskeletal:     Right lower leg: No edema.     Left lower leg: No edema.    Laboratory examination:   Lab Results  Component Value Date   NA 132 (L) 12/09/2022   K 3.5 12/09/2022   CO2 32 12/09/2022   GLUCOSE 161 (H) 12/09/2022   BUN 80 (H) 12/09/2022   CREATININE 1.77 (H) 12/09/2022   CALCIUM 8.6 (L) 12/09/2022   EGFR 37 (L) 03/25/2021   GFRNONAA 28 (L) 12/09/2022       Latest Ref Rng & Units 12/09/2022    2:05 AM 12/08/2022    4:56 AM 12/07/2022    6:13 AM  CMP   Glucose 70 - 99 mg/dL 14/11/2022  14/10/2022  14/09/2022   BUN 8 - 23 mg/dL 80  77  72   Creatinine 0.44 - 1.00 mg/dL 553  631  866   Sodium 135 - 145 mmol/L 132  136  133   Potassium 3.5 - 5.1 mmol/L 3.5  3.5  3.8   Chloride 98 - 111 mmol/L 89  94  93   CO2 22 - 32 mmol/L 32  30  32   Calcium 8.9 - 10.3 mg/dL 8.6  8.8  8.6       Latest Ref Rng & Units 12/09/2022    2:05 AM 12/08/2022    4:56 AM  12/07/2022    6:13 AM  CBC  WBC 4.0 - 10.5 K/uL 10.8  11.1  8.2   Hemoglobin 12.0 - 15.0 g/dL 42.6  83.4  19.6   Hematocrit 36.0 - 46.0 % 34.1  35.8  32.6   Platelets 150 - 400 K/uL 133  141  147    Lab Results  Component Value Date   CHOL 93 (L) 06/20/2022   HDL 43 06/20/2022   LDLCALC 32 06/20/2022   LDLDIRECT 33 06/20/2022   TRIG 90 06/20/2022   CHOLHDL 2.3 09/12/2019     HEMOGLOBIN A1C Lab Results  Component Value Date   HGBA1C 5.8 (H) 11/12/2022   MPG 119.76 11/12/2022   Lab Results  Component Value Date   TSH 0.288 (L) 03/25/2021    Lipid Panel Recent Labs    06/20/22 1451  CHOL 93*  TRIG 90  LDLCALC 32  HDL 43  LDLDIRECT 33   External labs:  Labs 02/25/2022:  A1c 12.5%.  TSH 2.1, normal.  BUN 28, creatinine 1.57, EGFR 30 mL, potassium 4.4.  Labs 07/17/2021:  Hb 10.5/HCT 33.0, platelets 113.  Normal indicis.  Hemoglobin 11.600 g/d 08/28/2021 Creatinine, Serum 1.600 mg/ 06/05/2021 Potassium 3.800 mm 06/05/2021 Magnesium N/D ALT (SGPT) 19.000 U/L 06/05/2021  TSH 1.250 10/28/2021   Allergies   Allergies  Allergen Reactions   Codeine Nausea And Vomiting   Hydrocodone-Acetaminophen Nausea And Vomiting     Final Medications at End of Visit     Current Outpatient Medications:    acetaminophen (TYLENOL) 325 MG tablet, Take 2 tablets (650 mg total) by mouth every 6 (six) hours as needed for mild pain, fever or headache., Disp: , Rfl:    budesonide (PULMICORT) 0.5 MG/2ML nebulizer solution, Take 2 mLs (0.5 mg total) by nebulization 2 (two) times daily., Disp: , Rfl: 12    calcium carbonate (SUPER CALCIUM) 1500 (600 Ca) MG TABS tablet, Take 600 mg of elemental calcium by mouth daily with breakfast., Disp: , Rfl:    diltiazem (DILACOR XR) 180 MG 24 hr capsule, Take 180 mg by mouth daily., Disp: , Rfl:    Fluocinolone Acetonide 0.01 % OIL, Place 5 drops in ear(s) daily., Disp: , Rfl:    furosemide (LASIX) 20 MG tablet, Take 1 tablet (20 mg total) by mouth daily., Disp: 30 tablet, Rfl: 11   insulin aspart (NOVOLOG) 100 UNIT/ML injection, Inject 0-9 Units into the skin 3 (three) times daily with meals., Disp: 10 mL, Rfl: 11   Insulin Pen Needle (NOVOFINE PLUS PEN NEEDLE) 32G X 4 MM MISC, ONE, Disp: , Rfl:    ipratropium-albuterol (DUONEB) 0.5-2.5 (3) MG/3ML SOLN, Take 3 mLs by nebulization every 6 (six) hours as needed., Disp: 360 mL, Rfl:    lactose free nutrition (BOOST) LIQD, Take 237 mLs by mouth daily., Disp: , Rfl:    levothyroxine (SYNTHROID) 88 MCG tablet, Take 88 mcg by mouth daily., Disp: , Rfl:    magnesium oxide (MAG-OX) 400 (240 Mg) MG tablet, Take 400 mg by mouth daily., Disp: , Rfl:    metoprolol succinate (TOPROL-XL) 100 MG 24 hr tablet, Take 1 tablet (100 mg total) by mouth daily. Take with or immediately following a meal., Disp: 90 tablet, Rfl: 1   Multiple Vitamin (MULTIVITAMIN WITH MINERALS) TABS tablet, Take 1 tablet by mouth daily., Disp: , Rfl:    polyethylene glycol (MIRALAX / GLYCOLAX) 17 g packet, Take 17 g by mouth daily as needed for mild constipation., Disp: 14 each, Rfl: 0  rosuvastatin (CRESTOR) 20 MG tablet, Take 20 mg by mouth daily., Disp: , Rfl:    sitaGLIPtin (JANUVIA) 50 MG tablet, Take 50 mg by mouth daily., Disp: , Rfl:    Skin Protectants, Misc. (EUCERIN) cream, Apply 1 Application topically 2 (two) times daily., Disp: , Rfl:    apixaban (ELIQUIS) 2.5 MG TABS tablet, Take 1 tablet (2.5 mg total) by mouth 2 (two) times daily., Disp: 180 tablet, Rfl: 3   Radiology   Bilateral ribs and chest 4 view 08/28/2021: No fracture or other  bone lesions are seen involving the ribs. There is no evidence of pneumothorax.  Chronic interstitial changes. Trace right pleural effusion. Heart size and mediastinal contours are within normal limits.  Cardiac Studies:   Coronary angiogram  06/24/2016  Mild pulmonary hypertension suggestive of primary pulmonary hypertension, mean PA pressure of 26 mmHg.   Proximal LAD revealing a 50% to 60% stenosis and calcified, No significant change from 04/20/2015.  Mild aortic stenosis with a 12 mm pressure gradient across the aortic valve.  Lexiscan myoview stress test 04/26/2018:  1. Lexiscan stress test performed. Exercise capacity not assessed. Stress symptoms included dizziness and chest pain/. Normal blood pressure. The resting electrocardiogram demonstrated atrial fibrillation with rapid ventricular rate, normal resting conduction and nonspecific ST-T changes.  Stress EKG is non diagnostic for ischemia as it is a pharmacologic stress.  2. The overall quality of the study is excellent. There is no evidence of abnormal lung activity. Stress and rest SPECT images demonstrate homogeneous tracer distribution throughout the myocardium. Gated SPECT imaging reveals normal myocardial thickening and wall motion. The left ventricular ejection fraction was normal (65%).   3. Low risk study.   Echocardiogram 11/12/2022:  1. The aortic valve is calcified. There is moderate calcification of the aortic valve. There is moderate thickening of the aortic valve. Aortic valve regurgitation is mild. Moderate aortic valve stenosis. Aortic valve area, by VTI measures 1.37 cm.  Aortic valve mean gradient measures 22.8 mmHg. Aortic valve Vmax measures 3.06 m/s.  2. Left ventricular ejection fraction, by estimation, is 70 to 75%. The left ventricle has hyperdynamic function. The left ventricle has no regional wall motion abnormalities. There is moderate left ventricular hypertrophy. Left ventricular diastolic  parameters are  indeterminate.  3. Right ventricular systolic function is normal. The right ventricular size is normal. There is moderately elevated pulmonary artery systolic pressure. The estimated right ventricular systolic pressure is 45.2 mmHg.  4. The mitral valve is degenerative. Mild mitral valve regurgitation. Mild mitral stenosis. The mean mitral valve gradient is 6.0 mmHg.  5. The inferior vena cava is normal in size with greater than 50% respiratory variability, suggesting right atrial pressure of 3 mmHg. Compared to 01/19/2022 no significant change.  EKG   EKG 01/12/2023: Atrial fibrillation with controlled ventricular response at the rate of 100-114 bpm, normal axis, anteroseptal infarct old.  Low-voltage complexes.  Pulmonary disease pattern.  Nonspecific T abnormality.  Compared to 08/20/2022, previous heart rate was 89 bpm.  Otherwise no significant change.   Assessment     ICD-10-CM   1. Chronic diastolic (congestive) heart failure (HCC)  I50.32 EKG 12-Lead    Pro b natriuretic peptide (BNP)    Full code    2. Permanent atrial fibrillation (HCC)  I48.21 apixaban (ELIQUIS) 2.5 MG TABS tablet    metoprolol succinate (TOPROL-XL) 100 MG 24 hr tablet    Full code    3. Primary hypertension  I10     4. Moderate aortic  stenosis  I35.0     5. ACP (advance care planning)  Z71.89       CHA2DS2-VASc Score is 6.  Yearly risk of stroke: 9.8% (F, A, HTN, DM, Vasc Dz).  Score of 1=0.6; 2=2.2; 3=3.2; 4=4.8; 5=7.2; 6=9.8; 7=>9.8) -(CHF; HTN; vasc disease DM,  Female = 1; Age <65 =0; 65-74 = 1,  >75 =2; stroke/embolism= 2).    Meds ordered this encounter  Medications   apixaban (ELIQUIS) 2.5 MG TABS tablet    Sig: Take 1 tablet (2.5 mg total) by mouth 2 (two) times daily.    Dispense:  180 tablet    Refill:  3   metoprolol succinate (TOPROL-XL) 100 MG 24 hr tablet    Sig: Take 1 tablet (100 mg total) by mouth daily. Take with or immediately following a meal.    Dispense:  90 tablet    Refill:  1     Medications Discontinued During This Encounter  Medication Reason   diltiazem (CARDIZEM CD) 120 MG 24 hr capsule Dose change   feeding supplement (ENSURE ENLIVE / ENSURE PLUS) LIQD Change in therapy   insulin glargine-yfgn (SEMGLEE) 100 UNIT/ML injection    Calcium Citrate-Vitamin D ([employer] CALCIUM CITRATE +D3 MINI PO)    ondansetron (ZOFRAN) 4 MG tablet Side effect (s)   traZODone (DESYREL) 50 MG tablet Patient Preference   apixaban (ELIQUIS) 2.5 MG TABS tablet    metoprolol succinate (TOPROL-XL) 25 MG 24 hr tablet Dose change   metoprolol succinate (TOPROL-XL) 50 MG 24 hr tablet Dose change    Orders Placed This Encounter  Procedures   Pro b natriuretic peptide (BNP)   Full code    Patient does not want to be on long-term mechanical ventilation, her son Jeannett Senior is present.  Temporary measures to keep her life sustainable is acceptable to the patient.   EKG 12-Lead    Recommendations:   [name]  is a 89 y.o. Caucasian female patient with rare form of low-grade lung carcinoid, severe esophageal dysmotility, permanent atrial fibrillation, moderate diffuse coronary artery disease, hypercholesterolemia, chronic diastolic heart failure, moderate aortic stenosis, asymptomatic right subclavian artery stenosis, hypertension, type II diabetes mellitus with stage IIIb chronic kidney disease, sick euthyroid syndrome presents here for follow-up of chronic diastolic heart failure and atrial fibrillation.    Patient admitted to the hospital on on 11/11/2022 and discharged on 11/26/2022 with acute hypoxemic respiratory failure, acute on chronic diastolic heart failure, A-fib with RVR, hyponatremia and was discharged to rehab.  Unfortunately was readmitted to the hospital on 12/01/2022  1. Chronic diastolic (congestive) heart failure (HCC) Patient is not in acute decompensated heart failure, there is no leg edema, no JVD.  She has coarse crackles in the lung that is chronic.  She will continue  with Lasix 20 mg daily.  2. Permanent atrial fibrillation [facility]) She is accompanied by her son Jeannett Senior, no 2 of her sons have taken all her medications.  In spite of this Eliquis was missing.  I given her samples of Eliquis 2.5 mg twice daily and sent in a prescription for long-term.  Heart rate is not well-controlled, will increase metoprolol succinate from 75 mg daily to 100 mg daily.  She will continue with diltiazem as well.  Not on amiodarone due to sick euthyroid syndrome.  3. Primary hypertension Blood pressure under excellent control however in view of diabetes mellitus, I prefer her to be on ACE inhibitor or an ARB, she has underlying stage IIIb chronic kidney disease.Marland Kitchen  Like to control her heart rate better prior to adding ARB and making her medication history more confusing.  Clear-cut instructions regarding her medications were given to the patient and her son and advised him to carry the AVS with them.  4.  Moderate aortic stenosis Patient has remained stable, hospital echocardiogram reviewed.  No significant change in aortic stenotic murmur and aortic valve gradients.  5. ACP (advance care planning) I discussed with the patient today about advance care planning, patient wishes to be full code.  I have created ACP document that she does not wish to be on long-term intubation or long-term life sustaining measures but willing to go through full ACLS protocol and is also willing to be on a ventilator if it is short-term.  She wants to be made comfort care in the event that she is unable to come off of the ventilator.  Her son Jeannett Senior who is power of attorney for health is also present.  This was a 40-minute office visit encounter, I spent 25 minutes with review of medications, hospital records and labs, additional 30 minutes spent on ACP and end-of-life discussion.   Yates Decamp, MD, [facility] 01/12/2023, 10:58 AM Office: [phone] Fax: [phone] Pager: [phone]

## 2023-01-13 DIAGNOSIS — E871 Hypo-osmolality and hyponatremia: Secondary | ICD-10-CM | POA: Diagnosis not present

## 2023-01-13 DIAGNOSIS — E039 Hypothyroidism, unspecified: Secondary | ICD-10-CM | POA: Diagnosis not present

## 2023-01-13 DIAGNOSIS — I272 Pulmonary hypertension, unspecified: Secondary | ICD-10-CM | POA: Diagnosis not present

## 2023-01-13 DIAGNOSIS — N1832 Chronic kidney disease, stage 3b: Secondary | ICD-10-CM | POA: Diagnosis not present

## 2023-01-13 DIAGNOSIS — J441 Chronic obstructive pulmonary disease with (acute) exacerbation: Secondary | ICD-10-CM | POA: Diagnosis not present

## 2023-01-13 DIAGNOSIS — Z9981 Dependence on supplemental oxygen: Secondary | ICD-10-CM | POA: Diagnosis not present

## 2023-01-13 DIAGNOSIS — I4821 Permanent atrial fibrillation: Secondary | ICD-10-CM | POA: Diagnosis not present

## 2023-01-13 DIAGNOSIS — E43 Unspecified severe protein-calorie malnutrition: Secondary | ICD-10-CM | POA: Diagnosis not present

## 2023-01-13 DIAGNOSIS — E538 Deficiency of other specified B group vitamins: Secondary | ICD-10-CM | POA: Diagnosis not present

## 2023-01-13 DIAGNOSIS — R1312 Dysphagia, oropharyngeal phase: Secondary | ICD-10-CM | POA: Diagnosis not present

## 2023-01-13 DIAGNOSIS — K219 Gastro-esophageal reflux disease without esophagitis: Secondary | ICD-10-CM | POA: Diagnosis not present

## 2023-01-13 DIAGNOSIS — J9621 Acute and chronic respiratory failure with hypoxia: Secondary | ICD-10-CM | POA: Diagnosis not present

## 2023-01-13 DIAGNOSIS — C7A8 Other malignant neuroendocrine tumors: Secondary | ICD-10-CM | POA: Diagnosis not present

## 2023-01-13 DIAGNOSIS — D631 Anemia in chronic kidney disease: Secondary | ICD-10-CM | POA: Diagnosis not present

## 2023-01-13 DIAGNOSIS — I130 Hypertensive heart and chronic kidney disease with heart failure and stage 1 through stage 4 chronic kidney disease, or unspecified chronic kidney disease: Secondary | ICD-10-CM | POA: Diagnosis not present

## 2023-01-13 DIAGNOSIS — Z7902 Long term (current) use of antithrombotics/antiplatelets: Secondary | ICD-10-CM | POA: Diagnosis not present

## 2023-01-13 DIAGNOSIS — E1122 Type 2 diabetes mellitus with diabetic chronic kidney disease: Secondary | ICD-10-CM | POA: Diagnosis not present

## 2023-01-13 DIAGNOSIS — G4700 Insomnia, unspecified: Secondary | ICD-10-CM | POA: Diagnosis not present

## 2023-01-13 DIAGNOSIS — I2510 Atherosclerotic heart disease of native coronary artery without angina pectoris: Secondary | ICD-10-CM | POA: Diagnosis not present

## 2023-01-13 DIAGNOSIS — I5033 Acute on chronic diastolic (congestive) heart failure: Secondary | ICD-10-CM | POA: Diagnosis not present

## 2023-01-13 LAB — PRO B NATRIURETIC PEPTIDE: NT-Pro BNP: 2095 pg/mL — ABNORMAL HIGH (ref 0–738)

## 2023-01-13 NOTE — Progress Notes (Signed)
Markedly elevated NT proBNP however clinically stable.   May be due to his heart baseline.  Will continue to monitor.  She has an appointment to see me next month.

## 2023-01-14 DIAGNOSIS — K219 Gastro-esophageal reflux disease without esophagitis: Secondary | ICD-10-CM | POA: Diagnosis not present

## 2023-01-14 DIAGNOSIS — I5033 Acute on chronic diastolic (congestive) heart failure: Secondary | ICD-10-CM | POA: Diagnosis not present

## 2023-01-14 DIAGNOSIS — E039 Hypothyroidism, unspecified: Secondary | ICD-10-CM | POA: Diagnosis not present

## 2023-01-14 DIAGNOSIS — E1122 Type 2 diabetes mellitus with diabetic chronic kidney disease: Secondary | ICD-10-CM | POA: Diagnosis not present

## 2023-01-14 DIAGNOSIS — G4700 Insomnia, unspecified: Secondary | ICD-10-CM | POA: Diagnosis not present

## 2023-01-14 DIAGNOSIS — D631 Anemia in chronic kidney disease: Secondary | ICD-10-CM | POA: Diagnosis not present

## 2023-01-14 DIAGNOSIS — I272 Pulmonary hypertension, unspecified: Secondary | ICD-10-CM | POA: Diagnosis not present

## 2023-01-14 DIAGNOSIS — N1832 Chronic kidney disease, stage 3b: Secondary | ICD-10-CM | POA: Diagnosis not present

## 2023-01-14 DIAGNOSIS — J441 Chronic obstructive pulmonary disease with (acute) exacerbation: Secondary | ICD-10-CM | POA: Diagnosis not present

## 2023-01-14 DIAGNOSIS — Z7902 Long term (current) use of antithrombotics/antiplatelets: Secondary | ICD-10-CM | POA: Diagnosis not present

## 2023-01-14 DIAGNOSIS — E43 Unspecified severe protein-calorie malnutrition: Secondary | ICD-10-CM | POA: Diagnosis not present

## 2023-01-14 DIAGNOSIS — I4821 Permanent atrial fibrillation: Secondary | ICD-10-CM | POA: Diagnosis not present

## 2023-01-14 DIAGNOSIS — R1312 Dysphagia, oropharyngeal phase: Secondary | ICD-10-CM | POA: Diagnosis not present

## 2023-01-14 DIAGNOSIS — J9621 Acute and chronic respiratory failure with hypoxia: Secondary | ICD-10-CM | POA: Diagnosis not present

## 2023-01-14 DIAGNOSIS — E871 Hypo-osmolality and hyponatremia: Secondary | ICD-10-CM | POA: Diagnosis not present

## 2023-01-14 DIAGNOSIS — I2510 Atherosclerotic heart disease of native coronary artery without angina pectoris: Secondary | ICD-10-CM | POA: Diagnosis not present

## 2023-01-14 DIAGNOSIS — E538 Deficiency of other specified B group vitamins: Secondary | ICD-10-CM | POA: Diagnosis not present

## 2023-01-14 DIAGNOSIS — I130 Hypertensive heart and chronic kidney disease with heart failure and stage 1 through stage 4 chronic kidney disease, or unspecified chronic kidney disease: Secondary | ICD-10-CM | POA: Diagnosis not present

## 2023-01-14 DIAGNOSIS — C7A8 Other malignant neuroendocrine tumors: Secondary | ICD-10-CM | POA: Diagnosis not present

## 2023-01-14 DIAGNOSIS — Z9981 Dependence on supplemental oxygen: Secondary | ICD-10-CM | POA: Diagnosis not present

## 2023-01-15 DIAGNOSIS — I272 Pulmonary hypertension, unspecified: Secondary | ICD-10-CM | POA: Diagnosis not present

## 2023-01-15 DIAGNOSIS — E1122 Type 2 diabetes mellitus with diabetic chronic kidney disease: Secondary | ICD-10-CM | POA: Diagnosis not present

## 2023-01-15 DIAGNOSIS — Z9981 Dependence on supplemental oxygen: Secondary | ICD-10-CM | POA: Diagnosis not present

## 2023-01-15 DIAGNOSIS — I2510 Atherosclerotic heart disease of native coronary artery without angina pectoris: Secondary | ICD-10-CM | POA: Diagnosis not present

## 2023-01-15 DIAGNOSIS — J9621 Acute and chronic respiratory failure with hypoxia: Secondary | ICD-10-CM | POA: Diagnosis not present

## 2023-01-15 DIAGNOSIS — G4700 Insomnia, unspecified: Secondary | ICD-10-CM | POA: Diagnosis not present

## 2023-01-15 DIAGNOSIS — R1312 Dysphagia, oropharyngeal phase: Secondary | ICD-10-CM | POA: Diagnosis not present

## 2023-01-15 DIAGNOSIS — E43 Unspecified severe protein-calorie malnutrition: Secondary | ICD-10-CM | POA: Diagnosis not present

## 2023-01-15 DIAGNOSIS — I5033 Acute on chronic diastolic (congestive) heart failure: Secondary | ICD-10-CM | POA: Diagnosis not present

## 2023-01-15 DIAGNOSIS — E538 Deficiency of other specified B group vitamins: Secondary | ICD-10-CM | POA: Diagnosis not present

## 2023-01-15 DIAGNOSIS — E871 Hypo-osmolality and hyponatremia: Secondary | ICD-10-CM | POA: Diagnosis not present

## 2023-01-15 DIAGNOSIS — E039 Hypothyroidism, unspecified: Secondary | ICD-10-CM | POA: Diagnosis not present

## 2023-01-15 DIAGNOSIS — J441 Chronic obstructive pulmonary disease with (acute) exacerbation: Secondary | ICD-10-CM | POA: Diagnosis not present

## 2023-01-15 DIAGNOSIS — N1832 Chronic kidney disease, stage 3b: Secondary | ICD-10-CM | POA: Diagnosis not present

## 2023-01-15 DIAGNOSIS — C7A8 Other malignant neuroendocrine tumors: Secondary | ICD-10-CM | POA: Diagnosis not present

## 2023-01-15 DIAGNOSIS — I130 Hypertensive heart and chronic kidney disease with heart failure and stage 1 through stage 4 chronic kidney disease, or unspecified chronic kidney disease: Secondary | ICD-10-CM | POA: Diagnosis not present

## 2023-01-15 DIAGNOSIS — D631 Anemia in chronic kidney disease: Secondary | ICD-10-CM | POA: Diagnosis not present

## 2023-01-15 DIAGNOSIS — K219 Gastro-esophageal reflux disease without esophagitis: Secondary | ICD-10-CM | POA: Diagnosis not present

## 2023-01-15 DIAGNOSIS — Z7902 Long term (current) use of antithrombotics/antiplatelets: Secondary | ICD-10-CM | POA: Diagnosis not present

## 2023-01-15 DIAGNOSIS — I4821 Permanent atrial fibrillation: Secondary | ICD-10-CM | POA: Diagnosis not present

## 2023-01-16 DIAGNOSIS — G4700 Insomnia, unspecified: Secondary | ICD-10-CM | POA: Diagnosis not present

## 2023-01-16 DIAGNOSIS — J9621 Acute and chronic respiratory failure with hypoxia: Secondary | ICD-10-CM | POA: Diagnosis not present

## 2023-01-16 DIAGNOSIS — Z9981 Dependence on supplemental oxygen: Secondary | ICD-10-CM | POA: Diagnosis not present

## 2023-01-16 DIAGNOSIS — E538 Deficiency of other specified B group vitamins: Secondary | ICD-10-CM | POA: Diagnosis not present

## 2023-01-16 DIAGNOSIS — I130 Hypertensive heart and chronic kidney disease with heart failure and stage 1 through stage 4 chronic kidney disease, or unspecified chronic kidney disease: Secondary | ICD-10-CM | POA: Diagnosis not present

## 2023-01-16 DIAGNOSIS — N1832 Chronic kidney disease, stage 3b: Secondary | ICD-10-CM | POA: Diagnosis not present

## 2023-01-16 DIAGNOSIS — C7A8 Other malignant neuroendocrine tumors: Secondary | ICD-10-CM | POA: Diagnosis not present

## 2023-01-16 DIAGNOSIS — I2510 Atherosclerotic heart disease of native coronary artery without angina pectoris: Secondary | ICD-10-CM | POA: Diagnosis not present

## 2023-01-16 DIAGNOSIS — J441 Chronic obstructive pulmonary disease with (acute) exacerbation: Secondary | ICD-10-CM | POA: Diagnosis not present

## 2023-01-16 DIAGNOSIS — D631 Anemia in chronic kidney disease: Secondary | ICD-10-CM | POA: Diagnosis not present

## 2023-01-16 DIAGNOSIS — I272 Pulmonary hypertension, unspecified: Secondary | ICD-10-CM | POA: Diagnosis not present

## 2023-01-16 DIAGNOSIS — R1312 Dysphagia, oropharyngeal phase: Secondary | ICD-10-CM | POA: Diagnosis not present

## 2023-01-16 DIAGNOSIS — Z7902 Long term (current) use of antithrombotics/antiplatelets: Secondary | ICD-10-CM | POA: Diagnosis not present

## 2023-01-16 DIAGNOSIS — K219 Gastro-esophageal reflux disease without esophagitis: Secondary | ICD-10-CM | POA: Diagnosis not present

## 2023-01-16 DIAGNOSIS — E871 Hypo-osmolality and hyponatremia: Secondary | ICD-10-CM | POA: Diagnosis not present

## 2023-01-16 DIAGNOSIS — I5033 Acute on chronic diastolic (congestive) heart failure: Secondary | ICD-10-CM | POA: Diagnosis not present

## 2023-01-16 DIAGNOSIS — E1122 Type 2 diabetes mellitus with diabetic chronic kidney disease: Secondary | ICD-10-CM | POA: Diagnosis not present

## 2023-01-16 DIAGNOSIS — E43 Unspecified severe protein-calorie malnutrition: Secondary | ICD-10-CM | POA: Diagnosis not present

## 2023-01-16 DIAGNOSIS — I4821 Permanent atrial fibrillation: Secondary | ICD-10-CM | POA: Diagnosis not present

## 2023-01-16 DIAGNOSIS — E039 Hypothyroidism, unspecified: Secondary | ICD-10-CM | POA: Diagnosis not present

## 2023-01-19 ENCOUNTER — Ambulatory Visit
Admission: RE | Admit: 2023-01-19 | Discharge: 2023-01-19 | Disposition: A | Discharge status: Home / Self Care | Payer: Medicare Other | Source: Ambulatory Visit | Attending: Nurse Practitioner | Admitting: Nurse Practitioner

## 2023-01-19 DIAGNOSIS — I5033 Acute on chronic diastolic (congestive) heart failure: Secondary | ICD-10-CM | POA: Diagnosis not present

## 2023-01-19 DIAGNOSIS — E042 Nontoxic multinodular goiter: Secondary | ICD-10-CM

## 2023-01-19 DIAGNOSIS — J441 Chronic obstructive pulmonary disease with (acute) exacerbation: Secondary | ICD-10-CM | POA: Diagnosis not present

## 2023-01-19 DIAGNOSIS — E1122 Type 2 diabetes mellitus with diabetic chronic kidney disease: Secondary | ICD-10-CM | POA: Diagnosis not present

## 2023-01-19 DIAGNOSIS — I130 Hypertensive heart and chronic kidney disease with heart failure and stage 1 through stage 4 chronic kidney disease, or unspecified chronic kidney disease: Secondary | ICD-10-CM | POA: Diagnosis not present

## 2023-01-20 ENCOUNTER — Other Ambulatory Visit: Payer: Self-pay

## 2023-01-20 DIAGNOSIS — I272 Pulmonary hypertension, unspecified: Secondary | ICD-10-CM | POA: Diagnosis not present

## 2023-01-20 DIAGNOSIS — Z7902 Long term (current) use of antithrombotics/antiplatelets: Secondary | ICD-10-CM | POA: Diagnosis not present

## 2023-01-20 DIAGNOSIS — Z9981 Dependence on supplemental oxygen: Secondary | ICD-10-CM | POA: Diagnosis not present

## 2023-01-20 DIAGNOSIS — C7A8 Other malignant neuroendocrine tumors: Secondary | ICD-10-CM | POA: Diagnosis not present

## 2023-01-20 DIAGNOSIS — R1312 Dysphagia, oropharyngeal phase: Secondary | ICD-10-CM | POA: Diagnosis not present

## 2023-01-20 DIAGNOSIS — I5033 Acute on chronic diastolic (congestive) heart failure: Secondary | ICD-10-CM | POA: Diagnosis not present

## 2023-01-20 DIAGNOSIS — E1122 Type 2 diabetes mellitus with diabetic chronic kidney disease: Secondary | ICD-10-CM | POA: Diagnosis not present

## 2023-01-20 DIAGNOSIS — K219 Gastro-esophageal reflux disease without esophagitis: Secondary | ICD-10-CM | POA: Diagnosis not present

## 2023-01-20 DIAGNOSIS — D631 Anemia in chronic kidney disease: Secondary | ICD-10-CM | POA: Diagnosis not present

## 2023-01-20 DIAGNOSIS — G4700 Insomnia, unspecified: Secondary | ICD-10-CM | POA: Diagnosis not present

## 2023-01-20 DIAGNOSIS — H6042 Cholesteatoma of left external ear: Secondary | ICD-10-CM | POA: Diagnosis not present

## 2023-01-20 DIAGNOSIS — E039 Hypothyroidism, unspecified: Secondary | ICD-10-CM | POA: Diagnosis not present

## 2023-01-20 DIAGNOSIS — J441 Chronic obstructive pulmonary disease with (acute) exacerbation: Secondary | ICD-10-CM | POA: Diagnosis not present

## 2023-01-20 DIAGNOSIS — J9621 Acute and chronic respiratory failure with hypoxia: Secondary | ICD-10-CM | POA: Diagnosis not present

## 2023-01-20 DIAGNOSIS — I130 Hypertensive heart and chronic kidney disease with heart failure and stage 1 through stage 4 chronic kidney disease, or unspecified chronic kidney disease: Secondary | ICD-10-CM | POA: Diagnosis not present

## 2023-01-20 DIAGNOSIS — I4821 Permanent atrial fibrillation: Secondary | ICD-10-CM | POA: Diagnosis not present

## 2023-01-20 DIAGNOSIS — E871 Hypo-osmolality and hyponatremia: Secondary | ICD-10-CM | POA: Diagnosis not present

## 2023-01-20 DIAGNOSIS — H6123 Impacted cerumen, bilateral: Secondary | ICD-10-CM | POA: Diagnosis not present

## 2023-01-20 DIAGNOSIS — I2510 Atherosclerotic heart disease of native coronary artery without angina pectoris: Secondary | ICD-10-CM | POA: Diagnosis not present

## 2023-01-20 DIAGNOSIS — E538 Deficiency of other specified B group vitamins: Secondary | ICD-10-CM | POA: Diagnosis not present

## 2023-01-20 DIAGNOSIS — E43 Unspecified severe protein-calorie malnutrition: Secondary | ICD-10-CM | POA: Diagnosis not present

## 2023-01-20 DIAGNOSIS — N1832 Chronic kidney disease, stage 3b: Secondary | ICD-10-CM | POA: Diagnosis not present

## 2023-01-20 MED ORDER — APIXABAN 2.5 MG PO TABS: 2.5000 mg | ORAL_TABLET | Freq: Two times a day (BID) | ORAL | 3 refills | Status: AC

## 2023-01-22 DIAGNOSIS — R1312 Dysphagia, oropharyngeal phase: Secondary | ICD-10-CM | POA: Diagnosis not present

## 2023-01-22 DIAGNOSIS — J441 Chronic obstructive pulmonary disease with (acute) exacerbation: Secondary | ICD-10-CM | POA: Diagnosis not present

## 2023-01-22 DIAGNOSIS — I2510 Atherosclerotic heart disease of native coronary artery without angina pectoris: Secondary | ICD-10-CM | POA: Diagnosis not present

## 2023-01-22 DIAGNOSIS — Z9981 Dependence on supplemental oxygen: Secondary | ICD-10-CM | POA: Diagnosis not present

## 2023-01-22 DIAGNOSIS — C7A8 Other malignant neuroendocrine tumors: Secondary | ICD-10-CM | POA: Diagnosis not present

## 2023-01-22 DIAGNOSIS — J9621 Acute and chronic respiratory failure with hypoxia: Secondary | ICD-10-CM | POA: Diagnosis not present

## 2023-01-22 DIAGNOSIS — K219 Gastro-esophageal reflux disease without esophagitis: Secondary | ICD-10-CM | POA: Diagnosis not present

## 2023-01-22 DIAGNOSIS — E43 Unspecified severe protein-calorie malnutrition: Secondary | ICD-10-CM | POA: Diagnosis not present

## 2023-01-22 DIAGNOSIS — I272 Pulmonary hypertension, unspecified: Secondary | ICD-10-CM | POA: Diagnosis not present

## 2023-01-22 DIAGNOSIS — I5033 Acute on chronic diastolic (congestive) heart failure: Secondary | ICD-10-CM | POA: Diagnosis not present

## 2023-01-22 DIAGNOSIS — E039 Hypothyroidism, unspecified: Secondary | ICD-10-CM | POA: Diagnosis not present

## 2023-01-22 DIAGNOSIS — D631 Anemia in chronic kidney disease: Secondary | ICD-10-CM | POA: Diagnosis not present

## 2023-01-22 DIAGNOSIS — Z7902 Long term (current) use of antithrombotics/antiplatelets: Secondary | ICD-10-CM | POA: Diagnosis not present

## 2023-01-22 DIAGNOSIS — E538 Deficiency of other specified B group vitamins: Secondary | ICD-10-CM | POA: Diagnosis not present

## 2023-01-22 DIAGNOSIS — I4821 Permanent atrial fibrillation: Secondary | ICD-10-CM | POA: Diagnosis not present

## 2023-01-22 DIAGNOSIS — E1122 Type 2 diabetes mellitus with diabetic chronic kidney disease: Secondary | ICD-10-CM | POA: Diagnosis not present

## 2023-01-22 DIAGNOSIS — I130 Hypertensive heart and chronic kidney disease with heart failure and stage 1 through stage 4 chronic kidney disease, or unspecified chronic kidney disease: Secondary | ICD-10-CM | POA: Diagnosis not present

## 2023-01-22 DIAGNOSIS — G4700 Insomnia, unspecified: Secondary | ICD-10-CM | POA: Diagnosis not present

## 2023-01-22 DIAGNOSIS — E871 Hypo-osmolality and hyponatremia: Secondary | ICD-10-CM | POA: Diagnosis not present

## 2023-01-22 DIAGNOSIS — N1832 Chronic kidney disease, stage 3b: Secondary | ICD-10-CM | POA: Diagnosis not present

## 2023-01-23 DIAGNOSIS — G4700 Insomnia, unspecified: Secondary | ICD-10-CM | POA: Diagnosis not present

## 2023-01-23 DIAGNOSIS — D491 Neoplasm of unspecified behavior of respiratory system: Secondary | ICD-10-CM | POA: Diagnosis not present

## 2023-01-23 DIAGNOSIS — I272 Pulmonary hypertension, unspecified: Secondary | ICD-10-CM | POA: Diagnosis not present

## 2023-01-23 DIAGNOSIS — E1122 Type 2 diabetes mellitus with diabetic chronic kidney disease: Secondary | ICD-10-CM | POA: Diagnosis not present

## 2023-01-23 DIAGNOSIS — Z7902 Long term (current) use of antithrombotics/antiplatelets: Secondary | ICD-10-CM | POA: Diagnosis not present

## 2023-01-23 DIAGNOSIS — I2510 Atherosclerotic heart disease of native coronary artery without angina pectoris: Secondary | ICD-10-CM | POA: Diagnosis not present

## 2023-01-23 DIAGNOSIS — J441 Chronic obstructive pulmonary disease with (acute) exacerbation: Secondary | ICD-10-CM | POA: Diagnosis not present

## 2023-01-23 DIAGNOSIS — I130 Hypertensive heart and chronic kidney disease with heart failure and stage 1 through stage 4 chronic kidney disease, or unspecified chronic kidney disease: Secondary | ICD-10-CM | POA: Diagnosis not present

## 2023-01-23 DIAGNOSIS — K219 Gastro-esophageal reflux disease without esophagitis: Secondary | ICD-10-CM | POA: Diagnosis not present

## 2023-01-23 DIAGNOSIS — E039 Hypothyroidism, unspecified: Secondary | ICD-10-CM | POA: Diagnosis not present

## 2023-01-23 DIAGNOSIS — N1832 Chronic kidney disease, stage 3b: Secondary | ICD-10-CM | POA: Diagnosis not present

## 2023-01-23 DIAGNOSIS — J9621 Acute and chronic respiratory failure with hypoxia: Secondary | ICD-10-CM | POA: Diagnosis not present

## 2023-01-23 DIAGNOSIS — Z0000 Encounter for general adult medical examination without abnormal findings: Secondary | ICD-10-CM | POA: Diagnosis not present

## 2023-01-23 DIAGNOSIS — I480 Paroxysmal atrial fibrillation: Secondary | ICD-10-CM | POA: Diagnosis not present

## 2023-01-23 DIAGNOSIS — E538 Deficiency of other specified B group vitamins: Secondary | ICD-10-CM | POA: Diagnosis not present

## 2023-01-23 DIAGNOSIS — I25759 Atherosclerosis of native coronary artery of transplanted heart with unspecified angina pectoris: Secondary | ICD-10-CM | POA: Diagnosis not present

## 2023-01-23 DIAGNOSIS — J40 Bronchitis, not specified as acute or chronic: Secondary | ICD-10-CM | POA: Diagnosis not present

## 2023-01-23 DIAGNOSIS — I5033 Acute on chronic diastolic (congestive) heart failure: Secondary | ICD-10-CM | POA: Diagnosis not present

## 2023-01-23 DIAGNOSIS — R5381 Other malaise: Secondary | ICD-10-CM | POA: Diagnosis not present

## 2023-01-23 DIAGNOSIS — Z9981 Dependence on supplemental oxygen: Secondary | ICD-10-CM | POA: Diagnosis not present

## 2023-01-23 DIAGNOSIS — I4821 Permanent atrial fibrillation: Secondary | ICD-10-CM | POA: Diagnosis not present

## 2023-01-23 DIAGNOSIS — C7A8 Other malignant neuroendocrine tumors: Secondary | ICD-10-CM | POA: Diagnosis not present

## 2023-01-23 DIAGNOSIS — R790 Abnormal level of blood mineral: Secondary | ICD-10-CM | POA: Diagnosis not present

## 2023-01-23 DIAGNOSIS — D631 Anemia in chronic kidney disease: Secondary | ICD-10-CM | POA: Diagnosis not present

## 2023-01-23 DIAGNOSIS — E119 Type 2 diabetes mellitus without complications: Secondary | ICD-10-CM | POA: Diagnosis not present

## 2023-01-23 DIAGNOSIS — E876 Hypokalemia: Secondary | ICD-10-CM | POA: Diagnosis not present

## 2023-01-23 DIAGNOSIS — R1312 Dysphagia, oropharyngeal phase: Secondary | ICD-10-CM | POA: Diagnosis not present

## 2023-01-23 DIAGNOSIS — I5032 Chronic diastolic (congestive) heart failure: Secondary | ICD-10-CM | POA: Diagnosis not present

## 2023-01-23 DIAGNOSIS — E43 Unspecified severe protein-calorie malnutrition: Secondary | ICD-10-CM | POA: Diagnosis not present

## 2023-01-23 DIAGNOSIS — E871 Hypo-osmolality and hyponatremia: Secondary | ICD-10-CM | POA: Diagnosis not present

## 2023-01-25 DIAGNOSIS — J9621 Acute and chronic respiratory failure with hypoxia: Secondary | ICD-10-CM | POA: Diagnosis not present

## 2023-01-25 DIAGNOSIS — C7A8 Other malignant neuroendocrine tumors: Secondary | ICD-10-CM | POA: Diagnosis not present

## 2023-01-25 DIAGNOSIS — Z9981 Dependence on supplemental oxygen: Secondary | ICD-10-CM | POA: Diagnosis not present

## 2023-01-25 DIAGNOSIS — J90 Pleural effusion, not elsewhere classified: Secondary | ICD-10-CM | POA: Diagnosis not present

## 2023-01-26 DIAGNOSIS — E039 Hypothyroidism, unspecified: Secondary | ICD-10-CM | POA: Diagnosis not present

## 2023-01-26 DIAGNOSIS — K219 Gastro-esophageal reflux disease without esophagitis: Secondary | ICD-10-CM | POA: Diagnosis not present

## 2023-01-26 DIAGNOSIS — J9621 Acute and chronic respiratory failure with hypoxia: Secondary | ICD-10-CM | POA: Diagnosis not present

## 2023-01-26 DIAGNOSIS — J441 Chronic obstructive pulmonary disease with (acute) exacerbation: Secondary | ICD-10-CM | POA: Diagnosis not present

## 2023-01-26 DIAGNOSIS — I272 Pulmonary hypertension, unspecified: Secondary | ICD-10-CM | POA: Diagnosis not present

## 2023-01-26 DIAGNOSIS — N1832 Chronic kidney disease, stage 3b: Secondary | ICD-10-CM | POA: Diagnosis not present

## 2023-01-26 DIAGNOSIS — C7A8 Other malignant neuroendocrine tumors: Secondary | ICD-10-CM | POA: Diagnosis not present

## 2023-01-26 DIAGNOSIS — E871 Hypo-osmolality and hyponatremia: Secondary | ICD-10-CM | POA: Diagnosis not present

## 2023-01-26 DIAGNOSIS — Z9981 Dependence on supplemental oxygen: Secondary | ICD-10-CM | POA: Diagnosis not present

## 2023-01-26 DIAGNOSIS — I4821 Permanent atrial fibrillation: Secondary | ICD-10-CM | POA: Diagnosis not present

## 2023-01-26 DIAGNOSIS — G4700 Insomnia, unspecified: Secondary | ICD-10-CM | POA: Diagnosis not present

## 2023-01-26 DIAGNOSIS — E43 Unspecified severe protein-calorie malnutrition: Secondary | ICD-10-CM | POA: Diagnosis not present

## 2023-01-26 DIAGNOSIS — I130 Hypertensive heart and chronic kidney disease with heart failure and stage 1 through stage 4 chronic kidney disease, or unspecified chronic kidney disease: Secondary | ICD-10-CM | POA: Diagnosis not present

## 2023-01-26 DIAGNOSIS — Z7902 Long term (current) use of antithrombotics/antiplatelets: Secondary | ICD-10-CM | POA: Diagnosis not present

## 2023-01-26 DIAGNOSIS — I2510 Atherosclerotic heart disease of native coronary artery without angina pectoris: Secondary | ICD-10-CM | POA: Diagnosis not present

## 2023-01-26 DIAGNOSIS — E1122 Type 2 diabetes mellitus with diabetic chronic kidney disease: Secondary | ICD-10-CM | POA: Diagnosis not present

## 2023-01-26 DIAGNOSIS — D631 Anemia in chronic kidney disease: Secondary | ICD-10-CM | POA: Diagnosis not present

## 2023-01-26 DIAGNOSIS — E538 Deficiency of other specified B group vitamins: Secondary | ICD-10-CM | POA: Diagnosis not present

## 2023-01-26 DIAGNOSIS — R1312 Dysphagia, oropharyngeal phase: Secondary | ICD-10-CM | POA: Diagnosis not present

## 2023-01-26 DIAGNOSIS — I5033 Acute on chronic diastolic (congestive) heart failure: Secondary | ICD-10-CM | POA: Diagnosis not present

## 2023-01-27 DIAGNOSIS — E039 Hypothyroidism, unspecified: Secondary | ICD-10-CM | POA: Diagnosis not present

## 2023-01-27 DIAGNOSIS — I2510 Atherosclerotic heart disease of native coronary artery without angina pectoris: Secondary | ICD-10-CM | POA: Diagnosis not present

## 2023-01-27 DIAGNOSIS — R1312 Dysphagia, oropharyngeal phase: Secondary | ICD-10-CM | POA: Diagnosis not present

## 2023-01-27 DIAGNOSIS — Z9981 Dependence on supplemental oxygen: Secondary | ICD-10-CM | POA: Diagnosis not present

## 2023-01-27 DIAGNOSIS — I4821 Permanent atrial fibrillation: Secondary | ICD-10-CM | POA: Diagnosis not present

## 2023-01-27 DIAGNOSIS — D631 Anemia in chronic kidney disease: Secondary | ICD-10-CM | POA: Diagnosis not present

## 2023-01-27 DIAGNOSIS — E871 Hypo-osmolality and hyponatremia: Secondary | ICD-10-CM | POA: Diagnosis not present

## 2023-01-27 DIAGNOSIS — J9621 Acute and chronic respiratory failure with hypoxia: Secondary | ICD-10-CM | POA: Diagnosis not present

## 2023-01-27 DIAGNOSIS — I272 Pulmonary hypertension, unspecified: Secondary | ICD-10-CM | POA: Diagnosis not present

## 2023-01-27 DIAGNOSIS — E43 Unspecified severe protein-calorie malnutrition: Secondary | ICD-10-CM | POA: Diagnosis not present

## 2023-01-27 DIAGNOSIS — E538 Deficiency of other specified B group vitamins: Secondary | ICD-10-CM | POA: Diagnosis not present

## 2023-01-27 DIAGNOSIS — I5033 Acute on chronic diastolic (congestive) heart failure: Secondary | ICD-10-CM | POA: Diagnosis not present

## 2023-01-27 DIAGNOSIS — E1122 Type 2 diabetes mellitus with diabetic chronic kidney disease: Secondary | ICD-10-CM | POA: Diagnosis not present

## 2023-01-27 DIAGNOSIS — K219 Gastro-esophageal reflux disease without esophagitis: Secondary | ICD-10-CM | POA: Diagnosis not present

## 2023-01-27 DIAGNOSIS — J441 Chronic obstructive pulmonary disease with (acute) exacerbation: Secondary | ICD-10-CM | POA: Diagnosis not present

## 2023-01-27 DIAGNOSIS — G4700 Insomnia, unspecified: Secondary | ICD-10-CM | POA: Diagnosis not present

## 2023-01-27 DIAGNOSIS — Z7902 Long term (current) use of antithrombotics/antiplatelets: Secondary | ICD-10-CM | POA: Diagnosis not present

## 2023-01-27 DIAGNOSIS — N1832 Chronic kidney disease, stage 3b: Secondary | ICD-10-CM | POA: Diagnosis not present

## 2023-01-27 DIAGNOSIS — I130 Hypertensive heart and chronic kidney disease with heart failure and stage 1 through stage 4 chronic kidney disease, or unspecified chronic kidney disease: Secondary | ICD-10-CM | POA: Diagnosis not present

## 2023-01-27 DIAGNOSIS — C7A8 Other malignant neuroendocrine tumors: Secondary | ICD-10-CM | POA: Diagnosis not present

## 2023-01-30 DIAGNOSIS — Z86011 Personal history of benign neoplasm of the brain: Secondary | ICD-10-CM | POA: Diagnosis not present

## 2023-01-30 DIAGNOSIS — R519 Headache, unspecified: Secondary | ICD-10-CM | POA: Diagnosis not present

## 2023-01-30 DIAGNOSIS — E118 Type 2 diabetes mellitus with unspecified complications: Secondary | ICD-10-CM | POA: Diagnosis not present

## 2023-02-02 ENCOUNTER — Telehealth: Payer: Self-pay | Admitting: Pulmonary Disease

## 2023-02-02 DIAGNOSIS — N1832 Chronic kidney disease, stage 3b: Secondary | ICD-10-CM | POA: Diagnosis not present

## 2023-02-02 DIAGNOSIS — C7A8 Other malignant neuroendocrine tumors: Secondary | ICD-10-CM | POA: Diagnosis not present

## 2023-02-02 DIAGNOSIS — E538 Deficiency of other specified B group vitamins: Secondary | ICD-10-CM | POA: Diagnosis not present

## 2023-02-02 DIAGNOSIS — I4821 Permanent atrial fibrillation: Secondary | ICD-10-CM | POA: Diagnosis not present

## 2023-02-02 DIAGNOSIS — D631 Anemia in chronic kidney disease: Secondary | ICD-10-CM | POA: Diagnosis not present

## 2023-02-02 DIAGNOSIS — E43 Unspecified severe protein-calorie malnutrition: Secondary | ICD-10-CM | POA: Diagnosis not present

## 2023-02-02 DIAGNOSIS — E1122 Type 2 diabetes mellitus with diabetic chronic kidney disease: Secondary | ICD-10-CM | POA: Diagnosis not present

## 2023-02-02 DIAGNOSIS — J441 Chronic obstructive pulmonary disease with (acute) exacerbation: Secondary | ICD-10-CM | POA: Diagnosis not present

## 2023-02-02 DIAGNOSIS — K219 Gastro-esophageal reflux disease without esophagitis: Secondary | ICD-10-CM | POA: Diagnosis not present

## 2023-02-02 DIAGNOSIS — E039 Hypothyroidism, unspecified: Secondary | ICD-10-CM | POA: Diagnosis not present

## 2023-02-02 DIAGNOSIS — J9621 Acute and chronic respiratory failure with hypoxia: Secondary | ICD-10-CM | POA: Diagnosis not present

## 2023-02-02 DIAGNOSIS — E871 Hypo-osmolality and hyponatremia: Secondary | ICD-10-CM | POA: Diagnosis not present

## 2023-02-02 DIAGNOSIS — I130 Hypertensive heart and chronic kidney disease with heart failure and stage 1 through stage 4 chronic kidney disease, or unspecified chronic kidney disease: Secondary | ICD-10-CM | POA: Diagnosis not present

## 2023-02-02 DIAGNOSIS — I272 Pulmonary hypertension, unspecified: Secondary | ICD-10-CM | POA: Diagnosis not present

## 2023-02-02 DIAGNOSIS — I5033 Acute on chronic diastolic (congestive) heart failure: Secondary | ICD-10-CM | POA: Diagnosis not present

## 2023-02-02 DIAGNOSIS — I2510 Atherosclerotic heart disease of native coronary artery without angina pectoris: Secondary | ICD-10-CM | POA: Diagnosis not present

## 2023-02-02 DIAGNOSIS — Z7902 Long term (current) use of antithrombotics/antiplatelets: Secondary | ICD-10-CM | POA: Diagnosis not present

## 2023-02-02 DIAGNOSIS — R1312 Dysphagia, oropharyngeal phase: Secondary | ICD-10-CM | POA: Diagnosis not present

## 2023-02-02 DIAGNOSIS — Z9981 Dependence on supplemental oxygen: Secondary | ICD-10-CM | POA: Diagnosis not present

## 2023-02-02 DIAGNOSIS — G4700 Insomnia, unspecified: Secondary | ICD-10-CM | POA: Diagnosis not present

## 2023-02-03 ENCOUNTER — Other Ambulatory Visit: Payer: Self-pay

## 2023-02-03 ENCOUNTER — Emergency Department: Payer: Medicare Other

## 2023-02-03 ENCOUNTER — Inpatient Hospital Stay
Admission: EM | Admit: 2023-02-03 | Discharge: 2023-02-27 | DRG: 286 | Disposition: E | Payer: Medicare Other | Attending: Internal Medicine | Admitting: Internal Medicine

## 2023-02-03 ENCOUNTER — Other Ambulatory Visit: Payer: Self-pay | Admitting: Registered Nurse

## 2023-02-03 DIAGNOSIS — Z7189 Other specified counseling: Secondary | ICD-10-CM | POA: Diagnosis not present

## 2023-02-03 DIAGNOSIS — K7460 Unspecified cirrhosis of liver: Secondary | ICD-10-CM | POA: Diagnosis not present

## 2023-02-03 DIAGNOSIS — E875 Hyperkalemia: Secondary | ICD-10-CM | POA: Diagnosis not present

## 2023-02-03 DIAGNOSIS — I5033 Acute on chronic diastolic (congestive) heart failure: Secondary | ICD-10-CM | POA: Diagnosis not present

## 2023-02-03 DIAGNOSIS — R34 Anuria and oliguria: Secondary | ICD-10-CM | POA: Diagnosis not present

## 2023-02-03 DIAGNOSIS — R54 Age-related physical debility: Secondary | ICD-10-CM | POA: Diagnosis not present

## 2023-02-03 DIAGNOSIS — R300 Dysuria: Secondary | ICD-10-CM | POA: Diagnosis present

## 2023-02-03 DIAGNOSIS — Z7901 Long term (current) use of anticoagulants: Secondary | ICD-10-CM

## 2023-02-03 DIAGNOSIS — J9621 Acute and chronic respiratory failure with hypoxia: Secondary | ICD-10-CM | POA: Diagnosis present

## 2023-02-03 DIAGNOSIS — E876 Hypokalemia: Secondary | ICD-10-CM | POA: Diagnosis not present

## 2023-02-03 DIAGNOSIS — C7A090 Malignant carcinoid tumor of the bronchus and lung: Secondary | ICD-10-CM | POA: Diagnosis not present

## 2023-02-03 DIAGNOSIS — Z8249 Family history of ischemic heart disease and other diseases of the circulatory system: Secondary | ICD-10-CM

## 2023-02-03 DIAGNOSIS — Z9981 Dependence on supplemental oxygen: Secondary | ICD-10-CM

## 2023-02-03 DIAGNOSIS — R5381 Other malaise: Secondary | ICD-10-CM | POA: Diagnosis not present

## 2023-02-03 DIAGNOSIS — E119 Type 2 diabetes mellitus without complications: Secondary | ICD-10-CM

## 2023-02-03 DIAGNOSIS — E440 Moderate protein-calorie malnutrition: Secondary | ICD-10-CM | POA: Diagnosis not present

## 2023-02-03 DIAGNOSIS — Z66 Do not resuscitate: Secondary | ICD-10-CM | POA: Diagnosis present

## 2023-02-03 DIAGNOSIS — M25531 Pain in right wrist: Secondary | ICD-10-CM | POA: Diagnosis not present

## 2023-02-03 DIAGNOSIS — Z9889 Other specified postprocedural states: Secondary | ICD-10-CM | POA: Diagnosis not present

## 2023-02-03 DIAGNOSIS — Z1152 Encounter for screening for COVID-19: Secondary | ICD-10-CM

## 2023-02-03 DIAGNOSIS — Z90710 Acquired absence of both cervix and uterus: Secondary | ICD-10-CM

## 2023-02-03 DIAGNOSIS — Z803 Family history of malignant neoplasm of breast: Secondary | ICD-10-CM

## 2023-02-03 DIAGNOSIS — I110 Hypertensive heart disease with heart failure: Secondary | ICD-10-CM | POA: Diagnosis not present

## 2023-02-03 DIAGNOSIS — N179 Acute kidney failure, unspecified: Secondary | ICD-10-CM | POA: Diagnosis not present

## 2023-02-03 DIAGNOSIS — E7800 Pure hypercholesterolemia, unspecified: Secondary | ICD-10-CM | POA: Diagnosis present

## 2023-02-03 DIAGNOSIS — I509 Heart failure, unspecified: Secondary | ICD-10-CM

## 2023-02-03 DIAGNOSIS — I482 Chronic atrial fibrillation, unspecified: Secondary | ICD-10-CM | POA: Diagnosis not present

## 2023-02-03 DIAGNOSIS — I5031 Acute diastolic (congestive) heart failure: Secondary | ICD-10-CM | POA: Diagnosis not present

## 2023-02-03 DIAGNOSIS — J810 Acute pulmonary edema: Secondary | ICD-10-CM | POA: Diagnosis not present

## 2023-02-03 DIAGNOSIS — R739 Hyperglycemia, unspecified: Secondary | ICD-10-CM | POA: Diagnosis not present

## 2023-02-03 DIAGNOSIS — D3A090 Benign carcinoid tumor of the bronchus and lung: Secondary | ICD-10-CM | POA: Diagnosis present

## 2023-02-03 DIAGNOSIS — E1122 Type 2 diabetes mellitus with diabetic chronic kidney disease: Secondary | ICD-10-CM | POA: Diagnosis not present

## 2023-02-03 DIAGNOSIS — I350 Nonrheumatic aortic (valve) stenosis: Secondary | ICD-10-CM

## 2023-02-03 DIAGNOSIS — J9811 Atelectasis: Secondary | ICD-10-CM | POA: Diagnosis not present

## 2023-02-03 DIAGNOSIS — J90 Pleural effusion, not elsewhere classified: Secondary | ICD-10-CM | POA: Diagnosis not present

## 2023-02-03 DIAGNOSIS — E871 Hypo-osmolality and hyponatremia: Secondary | ICD-10-CM | POA: Diagnosis not present

## 2023-02-03 DIAGNOSIS — Z823 Family history of stroke: Secondary | ICD-10-CM

## 2023-02-03 DIAGNOSIS — E43 Unspecified severe protein-calorie malnutrition: Secondary | ICD-10-CM | POA: Diagnosis not present

## 2023-02-03 DIAGNOSIS — J9601 Acute respiratory failure with hypoxia: Secondary | ICD-10-CM | POA: Diagnosis not present

## 2023-02-03 DIAGNOSIS — Z794 Long term (current) use of insulin: Secondary | ICD-10-CM

## 2023-02-03 DIAGNOSIS — Z515 Encounter for palliative care: Secondary | ICD-10-CM | POA: Diagnosis not present

## 2023-02-03 DIAGNOSIS — Z808 Family history of malignant neoplasm of other organs or systems: Secondary | ICD-10-CM

## 2023-02-03 DIAGNOSIS — E0781 Sick-euthyroid syndrome: Secondary | ICD-10-CM | POA: Diagnosis present

## 2023-02-03 DIAGNOSIS — J9600 Acute respiratory failure, unspecified whether with hypoxia or hypercapnia: Secondary | ICD-10-CM | POA: Diagnosis not present

## 2023-02-03 DIAGNOSIS — I25118 Atherosclerotic heart disease of native coronary artery with other forms of angina pectoris: Secondary | ICD-10-CM | POA: Diagnosis not present

## 2023-02-03 DIAGNOSIS — I130 Hypertensive heart and chronic kidney disease with heart failure and stage 1 through stage 4 chronic kidney disease, or unspecified chronic kidney disease: Secondary | ICD-10-CM | POA: Diagnosis not present

## 2023-02-03 DIAGNOSIS — K224 Dyskinesia of esophagus: Secondary | ICD-10-CM | POA: Diagnosis present

## 2023-02-03 DIAGNOSIS — I4821 Permanent atrial fibrillation: Secondary | ICD-10-CM | POA: Diagnosis not present

## 2023-02-03 DIAGNOSIS — E039 Hypothyroidism, unspecified: Secondary | ICD-10-CM | POA: Diagnosis not present

## 2023-02-03 DIAGNOSIS — Z833 Family history of diabetes mellitus: Secondary | ICD-10-CM

## 2023-02-03 DIAGNOSIS — I10 Essential (primary) hypertension: Secondary | ICD-10-CM | POA: Diagnosis present

## 2023-02-03 DIAGNOSIS — J918 Pleural effusion in other conditions classified elsewhere: Secondary | ICD-10-CM | POA: Diagnosis not present

## 2023-02-03 DIAGNOSIS — I708 Atherosclerosis of other arteries: Secondary | ICD-10-CM | POA: Diagnosis not present

## 2023-02-03 DIAGNOSIS — Z8673 Personal history of transient ischemic attack (TIA), and cerebral infarction without residual deficits: Secondary | ICD-10-CM

## 2023-02-03 DIAGNOSIS — N1832 Chronic kidney disease, stage 3b: Secondary | ICD-10-CM | POA: Diagnosis present

## 2023-02-03 DIAGNOSIS — Z79890 Hormone replacement therapy (postmenopausal): Secondary | ICD-10-CM

## 2023-02-03 DIAGNOSIS — J811 Chronic pulmonary edema: Secondary | ICD-10-CM | POA: Diagnosis not present

## 2023-02-03 DIAGNOSIS — Z743 Need for continuous supervision: Secondary | ICD-10-CM | POA: Diagnosis not present

## 2023-02-03 DIAGNOSIS — R0602 Shortness of breath: Secondary | ICD-10-CM | POA: Diagnosis not present

## 2023-02-03 DIAGNOSIS — E1165 Type 2 diabetes mellitus with hyperglycemia: Secondary | ICD-10-CM | POA: Diagnosis not present

## 2023-02-03 DIAGNOSIS — I959 Hypotension, unspecified: Secondary | ICD-10-CM | POA: Diagnosis not present

## 2023-02-03 DIAGNOSIS — Z86011 Personal history of benign neoplasm of the brain: Secondary | ICD-10-CM

## 2023-02-03 DIAGNOSIS — Z79899 Other long term (current) drug therapy: Secondary | ICD-10-CM

## 2023-02-03 DIAGNOSIS — E785 Hyperlipidemia, unspecified: Secondary | ICD-10-CM | POA: Insufficient documentation

## 2023-02-03 DIAGNOSIS — R0902 Hypoxemia: Secondary | ICD-10-CM | POA: Diagnosis not present

## 2023-02-03 DIAGNOSIS — J189 Pneumonia, unspecified organism: Secondary | ICD-10-CM | POA: Diagnosis not present

## 2023-02-03 DIAGNOSIS — I272 Pulmonary hypertension, unspecified: Secondary | ICD-10-CM | POA: Diagnosis not present

## 2023-02-03 DIAGNOSIS — I2510 Atherosclerotic heart disease of native coronary artery without angina pectoris: Secondary | ICD-10-CM | POA: Diagnosis present

## 2023-02-03 DIAGNOSIS — R531 Weakness: Secondary | ICD-10-CM | POA: Diagnosis not present

## 2023-02-03 DIAGNOSIS — I499 Cardiac arrhythmia, unspecified: Secondary | ICD-10-CM | POA: Diagnosis not present

## 2023-02-03 DIAGNOSIS — R918 Other nonspecific abnormal finding of lung field: Secondary | ICD-10-CM | POA: Diagnosis not present

## 2023-02-03 DIAGNOSIS — Z6821 Body mass index (BMI) 21.0-21.9, adult: Secondary | ICD-10-CM

## 2023-02-03 LAB — CBC
HCT: 30.4 % — ABNORMAL LOW (ref 36.0–46.0)
Hemoglobin: 9.8 g/dL — ABNORMAL LOW (ref 12.0–15.0)
MCH: 28.4 pg (ref 26.0–34.0)
MCHC: 32.2 g/dL (ref 30.0–36.0)
MCV: 88.1 fL (ref 80.0–100.0)
Platelets: 145 10*3/uL — ABNORMAL LOW (ref 150–400)
RBC: 3.45 MIL/uL — ABNORMAL LOW (ref 3.87–5.11)
RDW: 16 % — ABNORMAL HIGH (ref 11.5–15.5)
WBC: 6.2 10*3/uL (ref 4.0–10.5)
nRBC: 0 % (ref 0.0–0.2)

## 2023-02-03 LAB — BASIC METABOLIC PANEL
Anion gap: 10 (ref 5–15)
BUN: 44 mg/dL — ABNORMAL HIGH (ref 8–23)
CO2: 26 mmol/L (ref 22–32)
Calcium: 8.7 mg/dL — ABNORMAL LOW (ref 8.9–10.3)
Chloride: 93 mmol/L — ABNORMAL LOW (ref 98–111)
Creatinine, Ser: 1.42 mg/dL — ABNORMAL HIGH (ref 0.44–1.00)
GFR, Estimated: 36 mL/min — ABNORMAL LOW (ref >60)
Glucose, Bld: 311 mg/dL — ABNORMAL HIGH (ref 70–99)
Potassium: 4.2 mmol/L (ref 3.5–5.1)
Sodium: 129 mmol/L — ABNORMAL LOW (ref 135–145)

## 2023-02-03 LAB — URINALYSIS, COMPLETE (UACMP) WITH MICROSCOPIC
Bilirubin Urine: NEGATIVE
Glucose, UA: NEGATIVE mg/dL
Hgb urine dipstick: NEGATIVE
Ketones, ur: NEGATIVE mg/dL
Nitrite: NEGATIVE
Protein, ur: NEGATIVE mg/dL
Specific Gravity, Urine: 1.005 (ref 1.005–1.030)
Squamous Epithelial / HPF: NONE SEEN /HPF (ref 0–5)
pH: 6 (ref 5.0–8.0)

## 2023-02-03 LAB — RESP PANEL BY RT-PCR (RSV, FLU A&B, COVID)  RVPGX2
Influenza A by PCR: NEGATIVE
Influenza B by PCR: NEGATIVE
Resp Syncytial Virus by PCR: NEGATIVE
SARS Coronavirus 2 by RT PCR: NEGATIVE

## 2023-02-03 LAB — TROPONIN I (HIGH SENSITIVITY)
Troponin I (High Sensitivity): 7 ng/L (ref <18)
Troponin I (High Sensitivity): 8 ng/L (ref <18)

## 2023-02-03 LAB — PROCALCITONIN: Procalcitonin: 0.2 ng/mL

## 2023-02-03 LAB — BRAIN NATRIURETIC PEPTIDE: B Natriuretic Peptide: 335.2 pg/mL — ABNORMAL HIGH (ref 0.0–100.0)

## 2023-02-03 LAB — CBG MONITORING, ED: Glucose-Capillary: 153 mg/dL — ABNORMAL HIGH (ref 70–99)

## 2023-02-03 MED ORDER — IPRATROPIUM-ALBUTEROL 0.5-2.5 (3) MG/3ML IN SOLN
3.0000 mL | Freq: Four times a day (QID) | RESPIRATORY_TRACT | Status: DC | PRN
  Administered 2023-02-04 (×2): 3 mL via RESPIRATORY_TRACT
  Filled 2023-02-03 (×2): qty 3

## 2023-02-03 MED ORDER — FUROSEMIDE 10 MG/ML IJ SOLN
40.0000 mg | Freq: Once | INTRAMUSCULAR | Status: AC
  Administered 2023-02-03: 40 mg via INTRAVENOUS
  Filled 2023-02-03: qty 4

## 2023-02-03 MED ORDER — ACETAMINOPHEN 325 MG PO TABS
650.0000 mg | ORAL_TABLET | Freq: Four times a day (QID) | ORAL | Status: AC | PRN
  Administered 2023-02-06 – 2023-02-07 (×2): 650 mg via ORAL
  Filled 2023-02-03 (×2): qty 2

## 2023-02-03 MED ORDER — FUROSEMIDE 10 MG/ML IJ SOLN
20.0000 mg | Freq: Two times a day (BID) | INTRAMUSCULAR | Status: DC
  Administered 2023-02-04: 20 mg via INTRAVENOUS
  Filled 2023-02-03: qty 4

## 2023-02-03 MED ORDER — LEVOTHYROXINE SODIUM 88 MCG PO TABS
88.0000 ug | ORAL_TABLET | Freq: Every day | ORAL | Status: DC
  Administered 2023-02-04 – 2023-02-14 (×11): 88 ug via ORAL
  Filled 2023-02-03 (×12): qty 1

## 2023-02-03 MED ORDER — ROSUVASTATIN CALCIUM 10 MG PO TABS
20.0000 mg | ORAL_TABLET | Freq: Every day | ORAL | Status: DC
  Administered 2023-02-04 – 2023-02-14 (×9): 20 mg via ORAL
  Filled 2023-02-03: qty 1
  Filled 2023-02-03 (×9): qty 2

## 2023-02-03 MED ORDER — INSULIN ASPART 100 UNIT/ML IJ SOLN
0.0000 [IU] | Freq: Three times a day (TID) | INTRAMUSCULAR | Status: DC
  Administered 2023-02-04: 2 [IU] via SUBCUTANEOUS
  Administered 2023-02-04: 3 [IU] via SUBCUTANEOUS
  Administered 2023-02-04: 2 [IU] via SUBCUTANEOUS
  Administered 2023-02-05: 5 [IU] via SUBCUTANEOUS
  Administered 2023-02-05: 3 [IU] via SUBCUTANEOUS
  Administered 2023-02-05: 2 [IU] via SUBCUTANEOUS
  Administered 2023-02-06 (×2): 5 [IU] via SUBCUTANEOUS
  Administered 2023-02-06: 1 [IU] via SUBCUTANEOUS
  Administered 2023-02-07: 3 [IU] via SUBCUTANEOUS
  Administered 2023-02-07 (×2): 5 [IU] via SUBCUTANEOUS
  Administered 2023-02-08 (×2): 2 [IU] via SUBCUTANEOUS
  Administered 2023-02-08: 1 [IU] via SUBCUTANEOUS
  Administered 2023-02-09: 5 [IU] via SUBCUTANEOUS
  Administered 2023-02-09: 2 [IU] via SUBCUTANEOUS
  Administered 2023-02-09 – 2023-02-10 (×2): 5 [IU] via SUBCUTANEOUS
  Administered 2023-02-10: 3 [IU] via SUBCUTANEOUS
  Administered 2023-02-10: 2 [IU] via SUBCUTANEOUS
  Administered 2023-02-11 (×2): 3 [IU] via SUBCUTANEOUS
  Administered 2023-02-12: 2 [IU] via SUBCUTANEOUS
  Administered 2023-02-12: 3 [IU] via SUBCUTANEOUS
  Administered 2023-02-13: 2 [IU] via SUBCUTANEOUS
  Administered 2023-02-13: 3 [IU] via SUBCUTANEOUS
  Administered 2023-02-13: 1 [IU] via SUBCUTANEOUS
  Administered 2023-02-14 (×3): 3 [IU] via SUBCUTANEOUS
  Administered 2023-02-15: 5 [IU] via SUBCUTANEOUS
  Filled 2023-02-03 (×30): qty 1

## 2023-02-03 MED ORDER — APIXABAN 2.5 MG PO TABS
2.5000 mg | ORAL_TABLET | Freq: Two times a day (BID) | ORAL | Status: DC
  Administered 2023-02-03 – 2023-02-10 (×12): 2.5 mg via ORAL
  Filled 2023-02-03 (×13): qty 1

## 2023-02-03 MED ORDER — METOPROLOL TARTRATE 5 MG/5ML IV SOLN
2.5000 mg | INTRAVENOUS | Status: DC | PRN
  Filled 2023-02-03: qty 5

## 2023-02-03 MED ORDER — POLYETHYLENE GLYCOL 3350 17 G PO PACK
17.0000 g | PACK | Freq: Every day | ORAL | Status: DC | PRN
  Administered 2023-02-10: 17 g via ORAL
  Filled 2023-02-03 (×2): qty 1

## 2023-02-03 MED ORDER — HYDRALAZINE HCL 20 MG/ML IJ SOLN: 5.0000 mg | Freq: Three times a day (TID) | INTRAMUSCULAR | Status: AC | PRN

## 2023-02-03 MED ORDER — ONDANSETRON HCL 4 MG PO TABS: 4.0000 mg | ORAL_TABLET | Freq: Four times a day (QID) | ORAL | Status: AC | PRN

## 2023-02-03 MED ORDER — ACETAMINOPHEN 650 MG RE SUPP: 650.0000 mg | Freq: Four times a day (QID) | RECTAL | Status: AC | PRN

## 2023-02-03 MED ORDER — ONDANSETRON HCL 4 MG/2ML IJ SOLN
4.0000 mg | Freq: Four times a day (QID) | INTRAMUSCULAR | Status: AC | PRN
  Administered 2023-02-06 – 2023-02-08 (×2): 4 mg via INTRAVENOUS
  Filled 2023-02-03 (×3): qty 2

## 2023-02-03 MED ORDER — SENNOSIDES-DOCUSATE SODIUM 8.6-50 MG PO TABS
1.0000 | ORAL_TABLET | Freq: Every evening | ORAL | Status: DC | PRN
  Administered 2023-02-10: 1 via ORAL
  Filled 2023-02-03 (×2): qty 1

## 2023-02-03 MED ORDER — INSULIN ASPART 100 UNIT/ML IJ SOLN
0.0000 [IU] | Freq: Every day | INTRAMUSCULAR | Status: DC
  Administered 2023-02-04: 3 [IU] via SUBCUTANEOUS
  Administered 2023-02-05: 4 [IU] via SUBCUTANEOUS
  Administered 2023-02-07 – 2023-02-10 (×4): 2 [IU] via SUBCUTANEOUS
  Administered 2023-02-13: 3 [IU] via SUBCUTANEOUS
  Filled 2023-02-03 (×9): qty 1

## 2023-02-03 NOTE — Assessment & Plan Note (Signed)
At baseline 

## 2023-02-03 NOTE — H&P (Signed)
History and Physical   [name]:[id number] DOB: Jun 27, 1934 DOA: 02/09/2023  PCP: [name], FNP  Outpatient Specialists: DR. Erin Fulling, pulmonology Patient coming from: Home via EMS  I have personally briefly reviewed patient's old medical records in [place].  Chief Concern: Shortness of breath  HPI: [name] is a 89 year old female with hypertension, hyperlipidemia, atrial fibrillation on Eliquis, CAD, history of a stroke, mild pulmonary hypertension, stage IV neuroendocrine tumor favor carcinoid, on chornic who presents emergency department for chief concerns of shortness of breath.  Initial vitals in the ED showed temperature 97.6, RR 19, HR 96, blood pressure 114/57, spO2 96 (6L [place]).  sNa is a129, K 4.2, chloride 93, bicarb 26, BUN 44, sCr 1.42, nonfasting blood glucose 311, eGFR 36, wbc 6.2, hgb 9.8, platelets 145.  ED treatment: furosemide 40 mg IV once time dose ------------------------------- At bedside, she is able to tell me her name, age, current calendar year, current location.  She presents for chief concern of trouble breathing for 3-4 days. She denies known sick contacts, chest pain, fever, nausea, vomiting, syncope.  She endorses dysuria that started today. She endorses worsening of leg swelling for several days.  She endorses a chronic cough that is nonproductive and unchanged.  Social history: She lives in her own home. She has three sons who take turn helping her around the house and grocery and making food. She denies history of tobacco, etoh, and recreational drug use.   ROS: Constitutional: no weight change, no fever ENT/Mouth: no sore throat, no rhinorrhea Eyes: no eye pain, no vision changes Cardiovascular: no chest pain, + dyspnea,  + edema, no palpitations Respiratory: no cough, no sputum, no wheezing Gastrointestinal: no nausea, no vomiting, no diarrhea, no constipation Genitourinary: no urinary incontinence, + dysuria,  no hematuria Musculoskeletal: no arthralgias, no myalgias Skin: no skin lesions, no pruritus, Neuro: + weakness, no loss of consciousness, no syncope Psych: no anxiety, no depression, + decrease appetite Heme/Lymph: no bruising, no bleeding  ED Course: Discussed with emergency medicine provider, patient requiring hospitalization for chief concerns of heart failure exacerbation.  Assessment/Plan  Principal Problem:   Acute on chronic hypoxic respiratory failure (HCC) Active Problems:   Acute exacerbation of CHF (congestive heart failure) (HCC)   CAD (coronary artery disease)   Insulin dependent type 2 diabetes mellitus (HCC)   Stage 3b chronic kidney disease (CKD) (HCC)   Carcinoid tumor of lung   HTN (hypertension)   Hypothyroid   Physical deconditioning   Protein-calorie malnutrition, severe   Atrial fibrillation, chronic (HCC)   Hyperlipidemia   Dysuria   Assessment and Plan:  * Acute on chronic hypoxic respiratory failure (HCC) Etiology work up in progress, differential diagnosis includes pneumonia vs heart failure exacerbation - Treat as above - Continue present supplementation to maintain SpO2 greater than 92%  Acute exacerbation of CHF (congestive heart failure) ([place]) - I's and O's - Patient had a complete echo done November 2023, no echo ordered at this time - Furosemide 40 mg, one-time dose per EDP - I ordered furosemide 20 mg IV twice daily, 2 doses ordered for 02/04/2023  Insulin dependent type 2 diabetes mellitus (HCC) - Insulin SSI with at bedtime coverage ordered  Stage 3b chronic kidney disease (CKD) ([place]) - At baseline  Dysuria POA - UA ordered pending collection, discussed with nursing  Protein-calorie malnutrition, severe - Dietary has been consulted  Hypothyroid - Levothyroxine 88 mcg daily before breakfast resumed  HTN (hypertension) - Hydralazine 5  mg IV every 8 hours as needed for SBP greater 175, 4 days ordered  Chart reviewed.    Complete echo on 11/12/2022: Left ventricular ejection fraction estimated at 70 to 75%, left ventricle is hyperdynamic function. Moderate left ventricular hypertrophy.  DVT prophylaxis: Eliquis 2.5 mg p.o. twice daily Code Status: full code  Diet: Heart healthy Family Communication: Updated son, Vinia Jemmott over the phone Disposition Plan: Pending clinical course Consults called: none at this time Admission status: Telemetry cardiac, inpatient  Past Medical History:  Diagnosis Date   Acute diverticulitis 06/2017   Anemia 06/2017   CAD (coronary artery disease) 03/13/2012   Diabetes mellitus    Dysrhythmia    ATRIAL FIB   Fall 07/07/2017   HTN (hypertension) 03/13/2012   Hypothyroid 03/13/2012   Lung cancer ([place])    Shortness of breath    Stroke ([place])    Syncope and collapse 04/02/2016   Type II or unspecified type diabetes mellitus without mention of complication, not stated as uncontrolled 03/13/2012   Past Surgical History:  Procedure Laterality Date   ABDOMINAL HYSTERECTOMY     BIOPSY  06/13/2021   Procedure: BIOPSY;  Surgeon: Ronnette Juniper, MD;  Location: [facility];  Service: Gastroenterology;;   BREAST EXCISIONAL BIOPSY Left    BREAST SURGERY     benign tumors removed in [place]  04/20/2012   Procedure: CARDIOVERSION;  Surgeon: Laverda Page, MD;  Location: [facility];  Service: Cardiovascular;  Laterality: N/A;   CARDIOVERSION N/A 05/10/2013   Procedure: CARDIOVERSION;  Surgeon: Laverda Page, MD;  Location: [facility];  Service: Cardiovascular;  Laterality: N/A;   CHOLECYSTECTOMY     COLONOSCOPY WITH PROPOFOL N/A 06/13/2021   Procedure: COLONOSCOPY WITH PROPOFOL;  Surgeon: Ronnette Juniper, MD;  Location: [facility];  Service: Gastroenterology;  Laterality: N/A;   ESOPHAGOGASTRODUODENOSCOPY (EGD) WITH PROPOFOL N/A 06/13/2021   Procedure: ESOPHAGOGASTRODUODENOSCOPY (EGD) WITH PROPOFOL;  Surgeon: Ronnette Juniper, MD;   Location: [facility];  Service: Gastroenterology;  Laterality: N/A;   HEMORRHOID SURGERY     jaw replacement     LEFT HEART CATHETERIZATION WITH CORONARY ANGIOGRAM N/A 04/20/2015   Procedure: LEFT HEART CATHETERIZATION WITH CORONARY ANGIOGRAM;  Surgeon: Adrian Prows, MD;  Location: [facility] CATH LAB;  Service: Cardiovascular;  Laterality: N/A;   tumors     back of head   Social History:  reports that she has never smoked. She has never used smokeless tobacco. She reports that she does not drink alcohol and does not use drugs.  Allergies  Allergen Reactions   Codeine Nausea And Vomiting   Hydrocodone-Acetaminophen Nausea And Vomiting   Family History  Problem Relation Age of Onset   Breast cancer Mother    Aneurysm Father        Likely thoracic aneurysm per patient   Hypertension Sister    Heart disease Sister    Diabetes type II Sister    Diabetes type II Sister    Diverticulitis Sister    Diabetes type II Sister    Stroke Sister    Heart disease Brother    Melanoma Brother    Melanoma Brother    Family history: Family history reviewed and not pertinent.  Prior to Admission medications   Medication Sig Start Date End Date Taking? Authorizing Provider  acetaminophen (TYLENOL) 325 MG tablet Take 2 tablets (650 mg total) by mouth every 6 (six) hours as needed for mild pain, fever or headache. 11/26/22   Elgergawy,  Silver Huguenin, MD  apixaban (ELIQUIS) 2.5 MG TABS tablet Take 1 tablet (2.5 mg total) by mouth 2 (two) times daily. 01/20/23   Adrian Prows, MD  budesonide (PULMICORT) 0.5 MG/2ML nebulizer solution Take 2 mLs (0.5 mg total) by nebulization 2 (two) times daily. 11/26/22   Elgergawy, Silver Huguenin, MD  calcium carbonate (SUPER CALCIUM) 1500 (600 Ca) MG TABS tablet Take 600 mg of elemental calcium by mouth daily with breakfast.    [provider]  diltiazem (DILACOR XR) 180 MG 24 hr capsule Take 180 mg by mouth daily.    [provider]  Fluocinolone Acetonide 0.01 % OIL  Place 5 drops in ear(s) daily. 07/25/22   [provider]  furosemide (LASIX) 20 MG tablet Take 1 tablet (20 mg total) by mouth daily. 11/26/22 11/26/23  Elgergawy, Silver Huguenin, MD  insulin aspart (NOVOLOG) 100 UNIT/ML injection Inject 0-9 Units into the skin 3 (three) times daily with meals. 11/26/22   Elgergawy, Silver Huguenin, MD  Insulin Pen Needle (NOVOFINE PLUS PEN NEEDLE) 32G X 4 MM MISC ONE 04/10/22   [provider]  ipratropium-albuterol (DUONEB) 0.5-2.5 (3) MG/3ML SOLN Take 3 mLs by nebulization every 6 (six) hours as needed. 11/26/22   Elgergawy, Silver Huguenin, MD  lactose free nutrition (BOOST) LIQD Take 237 mLs by mouth daily.    [provider]  levothyroxine (SYNTHROID) 88 MCG tablet Take 88 mcg by mouth daily. 07/25/22   [provider]  magnesium oxide (MAG-OX) 400 (240 Mg) MG tablet Take 400 mg by mouth daily.    [provider]  metoprolol succinate (TOPROL-XL) 100 MG 24 hr tablet Take 1 tablet (100 mg total) by mouth daily. Take with or immediately following a meal. 01/12/23 07/11/23  Adrian Prows, MD  Multiple Vitamin (MULTIVITAMIN WITH MINERALS) TABS tablet Take 1 tablet by mouth daily. 11/27/22   Elgergawy, Silver Huguenin, MD  polyethylene glycol (MIRALAX / GLYCOLAX) 17 g packet Take 17 g by mouth daily as needed for mild constipation. 12/09/22   Richarda Osmond, MD  rosuvastatin (CRESTOR) 20 MG tablet Take 20 mg by mouth daily.    [provider]  sitaGLIPtin (JANUVIA) 50 MG tablet Take 50 mg by mouth daily.    [provider]  Skin Protectants, Misc. (EUCERIN) cream Apply 1 Application topically 2 (two) times daily.    [provider]   Physical Exam: Vitals:   02/11/2023 1901 02/11/2023 1903 02/02/2023 2038 02/10/2023 2226  BP: 127/79     Pulse: 86  78 65  Resp: (!) 22     Temp:      TempSrc:      SpO2: 96%  96% 97%  Weight:  54.3 kg    Height:  5\' 2"  (1.575 m)     Constitutional: appears age appropriate, frail, NAD, calm,  comfortable Eyes: PERRL, lids and conjunctivae normal ENMT: Mucous membranes are moist. Posterior pharynx clear of any exudate or lesions. Age-appropriate dentition. Hearing appropriate Neck: normal, supple, no masses, no thyromegaly Respiratory: Generalized decreased bilateral lung sounds, no wheezing, no crackles. Normal respiratory effort. No accessory muscle use.  Cardiovascular: Regular rate and rhythm, no murmurs / rubs / gallops. Bilateral 1+ pitting extremity edema. 2+ pedal pulses. No carotid bruits.  Abdomen: no tenderness, no masses palpated, no hepatosplenomegaly. Bowel sounds positive.  Musculoskeletal: no clubbing / cyanosis. No joint deformity upper and lower extremities. Good ROM, no contractures, no atrophy. Normal muscle tone.  Skin: no rashes, lesions, ulcers. No induration Neurologic: Sensation  intact. Strength 5/5 in all 4.  Psychiatric: Normal judgment and insight. Alert and oriented x 3. Normal mood.   EKG: independently reviewed, showing atrial fibrillation with rate of 80, QTc 440  Chest x-ray on Admission: I personally reviewed and I agree with radiologist reading as below.  DG Chest 2 View  Result Date: 02/17/2023 CLINICAL DATA:  Shortness of breath and weakness. EXAM: CHEST - 2 VIEW COMPARISON:  01/07/2023; 12/01/2022; 11/23/2022 FINDINGS: Grossly unchanged cardiac silhouette and mediastinal contours with atherosclerotic plaque within the thoracic aorta. Pulmonary vasculature is less distinct than present examination with cephalization of flow. Interval increase in small to moderate-sized bilateral pleural effusions with worsening bibasilar heterogeneous/consolidative opacities. Fluid is now seen tracking within the right minor and bilateral major fissures. No pneumothorax. No acute osseous abnormalities. IMPRESSION: Findings most suggestive of pulmonary edema with interval increase in small to moderate-sized bilateral effusions and worsening bibasilar  heterogeneous/consolidative opacities, likely atelectasis. Electronically Signed   By: Sandi Mariscal M.D.   On: 02/11/2023 13:47    Labs on Admission: I have personally reviewed following labs  CBC: Recent Labs  Lab 01/29/2023 1316  WBC 6.2  HGB 9.8*  HCT 30.4*  MCV 88.1  PLT 256*   Basic Metabolic Panel: Recent Labs  Lab 01/30/2023 1316  NA 129*  K 4.2  CL 93*  CO2 26  GLUCOSE 311*  BUN 44*  CREATININE 1.42*  CALCIUM 8.7*   GFR: Estimated Creatinine Clearance: 22.5 mL/min (A) (by C-G formula based on SCr of 1.42 mg/dL (H)).  BNP (last 3 results) Recent Labs    01/12/23 1127  PROBNP 2,095*   Urine analysis:    Component Value Date/Time   COLORURINE YELLOW (A) 12/01/2022 2027   APPEARANCEUR HAZY (A) 12/01/2022 2027   LABSPEC 1.017 12/01/2022 2027   PHURINE 6.0 12/01/2022 2027   GLUCOSEU NEGATIVE 12/01/2022 2027   HGBUR NEGATIVE 12/01/2022 2027   BILIRUBINUR NEGATIVE 12/01/2022 2027   KETONESUR NEGATIVE 12/01/2022 2027   PROTEINUR NEGATIVE 12/01/2022 2027   UROBILINOGEN 1.0 04/17/2015 1838   NITRITE NEGATIVE 12/01/2022 2027   LEUKOCYTESUR SMALL (A) 12/01/2022 2027   This document was prepared using Dragon Voice Recognition software and may include unintentional dictation errors.  Dr. Tobie Poet [facility]  If 7PM-7AM, please contact overnight-coverage provider If 7AM-7PM, please contact day coverage provider [email or web address]  02/14/2023, 10:58 PM

## 2023-02-03 NOTE — Assessment & Plan Note (Signed)
-   Dietary has been consulted

## 2023-02-03 NOTE — ED Notes (Signed)
Report given to Tiffany, RN. 

## 2023-02-03 NOTE — ED Notes (Addendum)
O2 adjusted to 3L at this time. Pt maintaining at 95%

## 2023-02-03 NOTE — Assessment & Plan Note (Addendum)
POA - UA ordered pending collection, discussed with nursing

## 2023-02-03 NOTE — Telephone Encounter (Signed)
Yes, ok to titrate O2 flow to reach SpO2 of 90% or higher.  Recommend she be scheduled for evaluation in clinic for this change in her oxygen needs. Can be with me or an APP.  Thanks, JD

## 2023-02-03 NOTE — Telephone Encounter (Signed)
Spoke with the pt's homecare nurse, Mickel Baas  She states that she is needing permission to up liter flow from 2lpm  Pt desaturated yesterday from 92% to 82% on 2lpm   Can we please tell them ok to adjust o2 flow up to maintain sats above 90%?

## 2023-02-03 NOTE — Assessment & Plan Note (Signed)
-   Insulin SSI with at bedtime coverage ordered °

## 2023-02-03 NOTE — ED Triage Notes (Signed)
Pt comes via [facility] from home with c/o sob for 2-3 days. Pt states she does wear 2L at home. Pt recently dx with pneumonia and didn't finish meds.

## 2023-02-03 NOTE — Hospital Course (Addendum)
[name] is a 89 year old female with hypertension, hyperlipidemia, atrial fibrillation on Eliquis, CAD, history of a stroke, mild pulmonary hypertension, stage IV neuroendocrine tumor favor carcinoid, on chornic who presents emergency department for chief concerns of shortness of breath.  Initial vitals in the ED showed temperature 97.6, RR 19, HR 96, blood pressure 114/57, spO2 96 (6L [place]).  sNa is a129, K 4.2, chloride 93, bicarb 26, BUN 44, sCr 1.42, nonfasting blood glucose 311, eGFR 36, wbc 6.2, hgb 9.8, platelets 145.  ED treatment: furosemide 40 mg IV once time dose

## 2023-02-03 NOTE — Assessment & Plan Note (Addendum)
Etiology work up in progress, differential diagnosis includes pneumonia vs heart failure exacerbation - Treat as above - Continue present supplementation to maintain SpO2 greater than 92%

## 2023-02-03 NOTE — Assessment & Plan Note (Signed)
-   Hydralazine 5 mg IV every 8 hours as needed for SBP greater 175, 4 days ordered

## 2023-02-03 NOTE — Telephone Encounter (Signed)
I called and spoke with [name] and notified of response per Dr Erin Fulling  She verbalized understanding  She states that she will have pt's son call to set up appt with APP  Will keep open to ensure that this appt gets scheduled

## 2023-02-03 NOTE — ED Provider Notes (Signed)
[facility] Provider Note   Event Date/Time   First MD Initiated Contact with Patient 01/30/2023 1544     (approximate) History  Shortness of Breath  HPI [name] is a 89 y.o. female with a past medical history of heart failure on 2 L oxygen nasal cannula at baseline who presents for worsening shortness of breath over the last 4 days as well as need to increase her oxygen requirement.  Patient was found to be 85% on her normal 2 L of oxygen and improved to 95% on 6 L nasal cannula.  Patient states that he was recently diagnosed with pneumonia and placed on antibiotics however she did not finish her antibiotics as she says it did not help her shortness of breath.  Patient denies any recent fever, or sick contacts. ROS: Patient currently denies any vision changes, tinnitus, difficulty speaking, facial droop, sore throat, chest pain, abdominal pain, nausea/vomiting/diarrhea, dysuria, or weakness/numbness/paresthesias in any extremity   Physical Exam  Triage Vital Signs: ED Triage Vitals  Enc Vitals Group     BP 02/07/2023 1316 (!) 114/57     Pulse Rate 02/06/2023 1316 96     Resp 02/10/2023 1316 19     Temp 02/23/2023 1316 98 F (36.7 C)     Temp Source 02/16/2023 1551 Oral     SpO2 02/11/2023 1316 96 %     Weight --      Height --      Head Circumference --      Peak Flow --      Pain Score 02/06/2023 1314 0     Pain Loc --      Pain Edu? --      Excl. in [place]? --    Most recent vital signs: Vitals:   02/25/2023 2038 02/08/2023 2226  BP:    Pulse: 78 65  Resp:    Temp:    SpO2: 96% 97%   General: Awake, oriented x4. CV:  Good peripheral perfusion.  Resp:  Increased effort.  Rales over bilateral lung fields Abd:  No distention.  Other:  Elderly Caucasian female laying in bed in mild respiratory distress on 6 L nasal cannula ED Results / Procedures / Treatments  Labs (all labs ordered are listed, but only abnormal results are displayed) Labs Reviewed  BASIC  METABOLIC PANEL - Abnormal; Notable for the following components:      Result Value   Sodium 129 (*)    Chloride 93 (*)    Glucose, Bld 311 (*)    BUN 44 (*)    Creatinine, Ser 1.42 (*)    Calcium 8.7 (*)    GFR, Estimated 36 (*)    All other components within normal limits  CBC - Abnormal; Notable for the following components:   RBC 3.45 (*)    Hemoglobin 9.8 (*)    HCT 30.4 (*)    RDW 16.0 (*)    Platelets 145 (*)    All other components within normal limits  BRAIN NATRIURETIC PEPTIDE - Abnormal; Notable for the following components:   B Natriuretic Peptide 335.2 (*)    All other components within normal limits  URINALYSIS, COMPLETE (UACMP) WITH MICROSCOPIC - Abnormal; Notable for the following components:   Color, Urine YELLOW (*)    APPearance CLEAR (*)    Leukocytes,Ua LARGE (*)    Bacteria, UA RARE (*)    All other components within normal limits  CBG MONITORING, ED - Abnormal; Notable for  the following components:   Glucose-Capillary 153 (*)    All other components within normal limits  RESP PANEL BY RT-PCR (RSV, FLU A&B, COVID)  [id number]  PROCALCITONIN  BASIC METABOLIC PANEL  CBC  TROPONIN I (HIGH SENSITIVITY)  TROPONIN I (HIGH SENSITIVITY)   EKG ED ECG REPORT I, Naaman Plummer, the attending physician, personally viewed and interpreted this ECG. Date: 02/02/2023 EKG Time: 1318 Rate: 80 Rhythm: Atrial fibrillation QRS Axis: normal Intervals: normal ST/T Wave abnormalities: normal Narrative Interpretation: Atrial fibrillation.  No evidence of acute ischemia RADIOLOGY ED MD interpretation: 2 view chest x-ray interpreted independently by me shows findings restive of pulmonary edema with interval increase in small to moderate bilateral pleural effusions -Agree with radiology assessment Official radiology report(s): DG Chest 2 View  Result Date: 02/25/2023 CLINICAL DATA:  Shortness of breath and weakness. EXAM: CHEST - 2 VIEW COMPARISON:  01/07/2023; 12/01/2022;  11/23/2022 FINDINGS: Grossly unchanged cardiac silhouette and mediastinal contours with atherosclerotic plaque within the thoracic aorta. Pulmonary vasculature is less distinct than present examination with cephalization of flow. Interval increase in small to moderate-sized bilateral pleural effusions with worsening bibasilar heterogeneous/consolidative opacities. Fluid is now seen tracking within the right minor and bilateral major fissures. No pneumothorax. No acute osseous abnormalities. IMPRESSION: Findings most suggestive of pulmonary edema with interval increase in small to moderate-sized bilateral effusions and worsening bibasilar heterogeneous/consolidative opacities, likely atelectasis. Electronically Signed   By: Sandi Mariscal M.D.   On: 02/05/2023 13:47   PROCEDURES: Critical Care performed: Yes, see critical care procedure note(s) .1-3 Lead EKG Interpretation  Performed by: Naaman Plummer, MD Authorized by: Naaman Plummer, MD     Interpretation: normal     ECG rate:  71   ECG rate assessment: normal     Rhythm: sinus rhythm     Ectopy: none     Conduction: normal   CRITICAL CARE Performed by: Naaman Plummer  Total critical care time: 31 minutes  Critical care time was exclusive of separately billable procedures and treating other patients.  Critical care was necessary to treat or prevent imminent or life-threatening deterioration.  Critical care was time spent personally by me on the following activities: development of treatment plan with patient and/or surrogate as well as nursing, discussions with consultants, evaluation of patient's response to treatment, examination of patient, obtaining history from patient or surrogate, ordering and performing treatments and interventions, ordering and review of laboratory studies, ordering and review of radiographic studies, pulse oximetry and re-evaluation of patient's condition.  MEDICATIONS ORDERED IN ED: Medications  levothyroxine  (SYNTHROID) tablet 88 mcg (has no administration in time range)  acetaminophen (TYLENOL) tablet 650 mg (has no administration in time range)    Or  acetaminophen (TYLENOL) suppository 650 mg (has no administration in time range)  ondansetron (ZOFRAN) tablet 4 mg (has no administration in time range)    Or  ondansetron (ZOFRAN) injection 4 mg (has no administration in time range)  senna-docusate (Senokot-S) tablet 1 tablet (has no administration in time range)  insulin aspart (novoLOG) injection 0-5 Units ( Subcutaneous Not Given 02/02/2023 2134)  insulin aspart (novoLOG) injection 0-9 Units (has no administration in time range)  furosemide (LASIX) injection 20 mg (has no administration in time range)  hydrALAZINE (APRESOLINE) injection 5 mg (has no administration in time range)  apixaban (ELIQUIS) tablet 2.5 mg (2.5 mg Oral Given 01/29/2023 2305)  ipratropium-albuterol (DUONEB) 0.5-2.5 (3) MG/3ML nebulizer solution 3 mL (has no administration in time range)  polyethylene glycol (  MIRALAX / GLYCOLAX) packet 17 g (has no administration in time range)  rosuvastatin (CRESTOR) tablet 20 mg (has no administration in time range)  metoprolol tartrate (LOPRESSOR) injection 2.5 mg (has no administration in time range)  furosemide (LASIX) injection 40 mg (40 mg Intravenous Given 02/10/2023 1752)   IMPRESSION / MDM / ASSESSMENT AND PLAN / ED COURSE  I reviewed the triage vital signs and the nursing notes.                             The patient is on the cardiac monitor to evaluate for evidence of arrhythmia and/or significant heart rate changes. Patient's presentation is most consistent with acute presentation with potential threat to life or bodily function. Endorses dyspnea, endorses LE edema Denies Non adherence to medication regimen  Workup: ECG, CBC, BMP, Troponin, BNP, CXR Findings: EKG: No STEMI and no evidence of Brugadas sign, delta wave, epsilon wave, significantly prolonged QTc, or malignant  arrhythmia. BNP: 335 CXR: Bilateral pulmonary edema and pleural effusions Based on history, exam and findings, presentation most consistent with acute on chronic heart failure. Low suspicion for PNA, ACS, tamponade, aortic dissection. Interventions: Oxygen, Diuresis  Reassessment: Symptoms improved in ED with oxygen and diuresis  Disposition (Stable but not significantly improved): Admit to medicine for further monitoring and for improvement of medication regimen to control symptoms.    FINAL CLINICAL IMPRESSION(S) / ED DIAGNOSES   Final diagnoses:  Acute on chronic congestive heart failure, unspecified heart failure type (HCC)  Acute pulmonary edema (HCC)  Acute respiratory failure with hypoxia ([place])   Rx / DC Orders   ED Discharge Orders     None      Note:  This document was prepared using Dragon voice recognition software and may include unintentional dictation errors.   Naaman Plummer, MD 02/21/2023 [phone]

## 2023-02-03 NOTE — ED Triage Notes (Signed)
First Nurse Note:  Pt via [facility] from home. Pt c/o recurrent SOB, pt was recently dx with PNA. Pt started on abx and stopped her abx 3 days short. Pt reports increased weakness and SOB for the past 3-4 days. EMS reports 2-3L [place] chronically. Initial sat on baseline O2 is 85% Pt is currently 95% on 6L [place]. Pt is A&Ox4 and NAD. EMS reports: 128/78 BP, RR 30, 97% on 6L [place], 378 CBG

## 2023-02-03 NOTE — Assessment & Plan Note (Signed)
-   I's and O's - Patient had a complete echo done November 2023, no echo ordered at this time - Furosemide 40 mg, one-time dose per EDP - I ordered furosemide 20 mg IV twice daily, 2 doses ordered for 02/04/2023

## 2023-02-03 NOTE — Assessment & Plan Note (Signed)
-   Levothyroxine 88 mcg daily before breakfast resumed

## 2023-02-04 ENCOUNTER — Encounter: Payer: Self-pay | Admitting: Internal Medicine

## 2023-02-04 DIAGNOSIS — J9621 Acute and chronic respiratory failure with hypoxia: Secondary | ICD-10-CM | POA: Diagnosis not present

## 2023-02-04 LAB — CBC
HCT: 29.9 % — ABNORMAL LOW (ref 36.0–46.0)
Hemoglobin: 9.7 g/dL — ABNORMAL LOW (ref 12.0–15.0)
MCH: 28.7 pg (ref 26.0–34.0)
MCHC: 32.4 g/dL (ref 30.0–36.0)
MCV: 88.5 fL (ref 80.0–100.0)
Platelets: 151 10*3/uL (ref 150–400)
RBC: 3.38 MIL/uL — ABNORMAL LOW (ref 3.87–5.11)
RDW: 16.1 % — ABNORMAL HIGH (ref 11.5–15.5)
WBC: 8.1 10*3/uL (ref 4.0–10.5)
nRBC: 0 % (ref 0.0–0.2)

## 2023-02-04 LAB — BASIC METABOLIC PANEL
Anion gap: 10 (ref 5–15)
BUN: 41 mg/dL — ABNORMAL HIGH (ref 8–23)
CO2: 28 mmol/L (ref 22–32)
Calcium: 8.7 mg/dL — ABNORMAL LOW (ref 8.9–10.3)
Chloride: 97 mmol/L — ABNORMAL LOW (ref 98–111)
Creatinine, Ser: 1.45 mg/dL — ABNORMAL HIGH (ref 0.44–1.00)
GFR, Estimated: 35 mL/min — ABNORMAL LOW (ref >60)
Glucose, Bld: 127 mg/dL — ABNORMAL HIGH (ref 70–99)
Potassium: 3.9 mmol/L (ref 3.5–5.1)
Sodium: 135 mmol/L (ref 135–145)

## 2023-02-04 LAB — CBG MONITORING, ED
Glucose-Capillary: 209 mg/dL — ABNORMAL HIGH (ref 70–99)
Glucose-Capillary: 170 mg/dL — ABNORMAL HIGH (ref 70–99)
Glucose-Capillary: 219 mg/dL — ABNORMAL HIGH (ref 70–99)

## 2023-02-04 LAB — GLUCOSE, CAPILLARY: Glucose-Capillary: 278 mg/dL — ABNORMAL HIGH (ref 70–99)

## 2023-02-04 MED ORDER — ADULT MULTIVITAMIN W/MINERALS CH
1.0000 | ORAL_TABLET | Freq: Every day | ORAL | Status: DC
  Administered 2023-02-04 – 2023-02-14 (×9): 1 via ORAL
  Filled 2023-02-04 (×9): qty 1

## 2023-02-04 MED ORDER — DILTIAZEM HCL ER 180 MG PO TB24
180.0000 mg | ORAL_TABLET | Freq: Every day | ORAL | Status: DC
  Administered 2023-02-04 – 2023-02-09 (×6): 180 mg via ORAL
  Filled 2023-02-04 (×11): qty 1

## 2023-02-04 MED ORDER — METOPROLOL SUCCINATE ER 25 MG PO TB24
12.5000 mg | ORAL_TABLET | Freq: Every day | ORAL | Status: DC
  Administered 2023-02-04 – 2023-02-09 (×5): 12.5 mg via ORAL
  Filled 2023-02-04 (×5): qty 1

## 2023-02-04 MED ORDER — SODIUM CHLORIDE 0.9 % IV SOLN
1.0000 g | INTRAVENOUS | Status: AC
  Administered 2023-02-04 – 2023-02-06 (×3): 1 g via INTRAVENOUS
  Filled 2023-02-04: qty 1
  Filled 2023-02-04 (×2): qty 10

## 2023-02-04 MED ORDER — ENSURE ENLIVE PO LIQD
237.0000 mL | Freq: Two times a day (BID) | ORAL | Status: DC
  Administered 2023-02-04 – 2023-02-14 (×12): 237 mL via ORAL

## 2023-02-04 MED ORDER — FUROSEMIDE 10 MG/ML IJ SOLN
20.0000 mg | Freq: Two times a day (BID) | INTRAMUSCULAR | Status: DC
  Administered 2023-02-04 – 2023-02-05 (×2): 20 mg via INTRAVENOUS
  Filled 2023-02-04: qty 4
  Filled 2023-02-04: qty 2

## 2023-02-04 MED ORDER — METOPROLOL SUCCINATE ER 50 MG PO TB24: 25.0000 mg | ORAL_TABLET | Freq: Every day | ORAL | Status: DC

## 2023-02-04 NOTE — ED Notes (Signed)
Pt up to commode with standby assistance

## 2023-02-04 NOTE — Progress Notes (Signed)
PT Cancellation Note  Patient Details Name: [name] MRN: [id number] DOB: 1934-12-14   Cancelled Treatment:    Reason Eval/Treat Not Completed: Fatigue/lethargy limiting ability to participate. Patient declines PT at this time. Wants to wait until am. She is tachycardic, increased RR rate.    Derel Mcglasson 02/04/2023, 3:48 PM

## 2023-02-04 NOTE — ED Notes (Signed)
Informed RN bed assigned 

## 2023-02-04 NOTE — Progress Notes (Signed)
PROGRESS NOTE    [name]  VQM:[id number] DOB: January 11, 1934 DOA: 02/11/2023 PCP: [name], FNP    Brief Narrative:  89 year old female with hypertension, hyperlipidemia, atrial fibrillation on Eliquis, CAD, history of a stroke, mild pulmonary hypertension, stage IV neuroendocrine tumor favor carcinoid, on chornic who presents emergency department for chief concerns of shortness of breath.   Initial vitals in the ED showed temperature 97.6, RR 19, HR 96, blood pressure 114/57, spO2 96 (6L [place]).   sNa is a129, K 4.2, chloride 93, bicarb 26, BUN 44, sCr 1.42, nonfasting blood glucose 311, eGFR 36, wbc 6.2, hgb 9.8, platelets 145.   ED treatment: furosemide 40 mg IV once time dose   Assessment & Plan:   Principal Problem:   Acute on chronic hypoxic respiratory failure (HCC) Active Problems:   Acute exacerbation of CHF (congestive heart failure) (HCC)   CAD (coronary artery disease)   Insulin dependent type 2 diabetes mellitus (HCC)   Stage 3b chronic kidney disease (CKD) ([place])   Carcinoid tumor of lung   HTN (hypertension)   Hypothyroid   Physical deconditioning   Protein-calorie malnutrition, severe   Atrial fibrillation, chronic (HCC)   Hyperlipidemia   Dysuria  * Acute on chronic hypoxic respiratory failure (HCC) Etiology likely related to decompensated heart failure Plan: Treatment with IV diuretic for decompensated heart failure Wean oxygen as tolerated, goal saturation greater than 92%    Acute exacerbation of diastolic CHF (congestive heart failure) ([place]) Patient has elevated BNP and clinical signs of fluid overload Complete echo done November 2023 Plan: Continue Lasix intravenous 20 mg twice daily Strict ins and outs, daily weights Fluid restrict Telemetry  Permanent atrial fibrillation Provide cardiology clinic note rate control continues to be an issue.  Patient currently on metoprolol succinate and a long-acting Cardizem.  These have been  resumed with dose reduction given relative hypotension.  Maintain telemetry monitoring.   Insulin dependent type 2 diabetes mellitus (HCC) - Insulin SSI with at bedtime coverage ordered   Stage 3b chronic kidney disease (CKD) ([place]) - At baseline   Dysuria POA Urinalysis with large leukocytes.  Patient endorsing dysuria Plan: Rocephin 1 g daily x 3 days   Protein-calorie malnutrition, severe - RD has been consulted   Hypothyroid - Levothyroxine 88 mcg daily before breakfast resumed   HTN (hypertension) - Hydralazine 5 mg IV every 8 hours as needed for SBP greater 175, 4 days ordered   DVT prophylaxis: Eliquis Code Status: Full Family Communication: Left VM for son [name] [phone] on 2/7 Disposition Plan: Status is: Inpatient Remains inpatient appropriate because: Decompensated heart failure on IV diuretic   Level of care: Telemetry Cardiac  Consultants:  Palliative care  Procedures:  None  Antimicrobials: Rocephin   Subjective: Seen and examined.  Endorses mild improvement in shortness of breath since admission.  No pain complaints.  Objective: Vitals:   02/04/23 1230 02/04/23 1245 02/04/23 1400 02/04/23 1410  BP: 103/85  91/76 91/76  Pulse: (!) 105 99 (!) 111 (!) 114  Resp: (!) 41 (!) 23 (!) 21 (!) 25  Temp:    98.1 F (36.7 C)  TempSrc:    Oral  SpO2:    97%  Weight:      Height:        Intake/Output Summary (Last 24 hours) at 02/04/2023 1417 Last data filed at 02/04/2023 0757 Gross per 24 hour  Intake --  Output 1200 ml  Net -1200 ml   [employer]   02/24/2023  1903  Weight: 54.3 kg    Examination:  General exam: NAD. Respiratory system: Bilateral coarse crackles.  Normal work of breathing.  3 L Cardiovascular system: S1-S2, regular rate, irregular rhythm, no murmurs, 1+ pedal edema Gastrointestinal system: Thin, soft, NT/ND, normal bowel sounds Central nervous system: Alert and oriented. No focal neurological deficits. Extremities:  Symmetric 5 x 5 power. Skin: No rashes, lesions or ulcers Psychiatry: Judgement and insight appear normal. Mood & affect appropriate.     Data Reviewed: I have personally reviewed following labs and imaging studies  CBC: Recent Labs  Lab 02/24/2023 1316 02/04/23 0545  WBC 6.2 8.1  HGB 9.8* 9.7*  HCT 30.4* 29.9*  MCV 88.1 88.5  PLT 145* 568   Basic Metabolic Panel: Recent Labs  Lab 02/07/2023 1316 02/04/23 0545  NA 129* 135  K 4.2 3.9  CL 93* 97*  CO2 26 28  GLUCOSE 311* 127*  BUN 44* 41*  CREATININE 1.42* 1.45*  CALCIUM 8.7* 8.7*   GFR: Estimated Creatinine Clearance: 22 mL/min (A) (by C-G formula based on SCr of 1.45 mg/dL (H)). Liver Function Tests: No results for input(s): "AST", "ALT", "ALKPHOS", "BILITOT", "PROT", "ALBUMIN" in the last 168 hours. No results for input(s): "LIPASE", "AMYLASE" in the last 168 hours. No results for input(s): "AMMONIA" in the last 168 hours. Coagulation Profile: No results for input(s): "INR", "PROTIME" in the last 168 hours. Cardiac Enzymes: No results for input(s): "CKTOTAL", "CKMB", "CKMBINDEX", "TROPONINI" in the last 168 hours. BNP (last 3 results) Recent Labs    01/12/23 1127  PROBNP 2,095*   HbA1C: No results for input(s): "HGBA1C" in the last 72 hours. CBG: Recent Labs  Lab 02/13/2023 2132 02/04/23 0754 02/04/23 1224  GLUCAP 153* 209* 170*   Lipid Profile: No results for input(s): "CHOL", "HDL", "LDLCALC", "TRIG", "CHOLHDL", "LDLDIRECT" in the last 72 hours. Thyroid Function Tests: No results for input(s): "TSH", "T4TOTAL", "FREET4", "T3FREE", "THYROIDAB" in the last 72 hours. Anemia Panel: No results for input(s): "VITAMINB12", "FOLATE", "FERRITIN", "TIBC", "IRON", "RETICCTPCT" in the last 72 hours. Sepsis Labs: Recent Labs  Lab 02/20/2023 1902  PROCALCITON 0.20    Recent Results (from the past 240 hour(s))  Resp panel by RT-PCR (RSV, Flu A&B, Covid) Anterior Nasal Swab     Status: None   Collection Time:  02/09/2023  5:58 PM   Specimen: Anterior Nasal Swab  Result Value Ref Range Status   SARS Coronavirus 2 by RT PCR NEGATIVE NEGATIVE Final    Comment: (NOTE) SARS-CoV-2 target nucleic acids are NOT DETECTED.  The SARS-CoV-2 RNA is generally detectable in upper respiratory specimens during the acute phase of infection. The lowest concentration of SARS-CoV-2 viral copies this assay can detect is 138 copies/mL. A negative result does not preclude SARS-Cov-2 infection and should not be used as the sole basis for treatment or other patient management decisions. A negative result may occur with  improper specimen collection/handling, submission of specimen other than nasopharyngeal swab, presence of viral mutation(s) within the areas targeted by this assay, and inadequate number of viral copies(<138 copies/mL). A negative result must be combined with clinical observations, patient history, and epidemiological information. The expected result is Negative.  Fact Sheet for Patients:  EntrepreneurPulse.com.au  Fact Sheet for Healthcare Providers:  IncredibleEmployment.be  This test is no t yet approved or cleared by the [place] FDA and  has been authorized for detection and/or diagnosis of SARS-CoV-2 by FDA under an Emergency Use Authorization (EUA). This EUA will remain  in effect (meaning  this test can be used) for the duration of the COVID-19 declaration under Section 564(b)(1) of the Act, 21 U.S.C.section 360bbb-3(b)(1), unless the authorization is terminated  or revoked sooner.       Influenza A by PCR NEGATIVE NEGATIVE Final   Influenza B by PCR NEGATIVE NEGATIVE Final    Comment: (NOTE) The Xpert Xpress SARS-CoV-2/FLU/RSV plus assay is intended as an aid in the diagnosis of influenza from Nasopharyngeal swab specimens and should not be used as a sole basis for treatment. Nasal washings and aspirates are unacceptable for Xpert Xpress  SARS-CoV-2/FLU/RSV testing.  Fact Sheet for Patients: EntrepreneurPulse.com.au  Fact Sheet for Healthcare Providers: IncredibleEmployment.be  This test is not yet approved or cleared by the [place] FDA and has been authorized for detection and/or diagnosis of SARS-CoV-2 by FDA under an Emergency Use Authorization (EUA). This EUA will remain in effect (meaning this test can be used) for the duration of the COVID-19 declaration under Section 564(b)(1) of the Act, 21 U.S.C. section 360bbb-3(b)(1), unless the authorization is terminated or revoked.     Resp Syncytial Virus by PCR NEGATIVE NEGATIVE Final    Comment: (NOTE) Fact Sheet for Patients: EntrepreneurPulse.com.au  Fact Sheet for Healthcare Providers: IncredibleEmployment.be  This test is not yet approved or cleared by the [place] FDA and has been authorized for detection and/or diagnosis of SARS-CoV-2 by FDA under an Emergency Use Authorization (EUA). This EUA will remain in effect (meaning this test can be used) for the duration of the COVID-19 declaration under Section 564(b)(1) of the Act, 21 U.S.C. section 360bbb-3(b)(1), unless the authorization is terminated or revoked.  Performed at [facility], [address] [place], [place] [address]          Radiology Studies: DG Chest 2 View  Result Date: 02/11/2023 CLINICAL DATA:  Shortness of breath and weakness. EXAM: CHEST - 2 VIEW COMPARISON:  01/07/2023; 12/01/2022; 11/23/2022 FINDINGS: Grossly unchanged cardiac silhouette and mediastinal contours with atherosclerotic plaque within the thoracic aorta. Pulmonary vasculature is less distinct than present examination with cephalization of flow. Interval increase in small to moderate-sized bilateral pleural effusions with worsening bibasilar heterogeneous/consolidative opacities. Fluid is now seen tracking within the right minor  and bilateral major fissures. No pneumothorax. No acute osseous abnormalities. IMPRESSION: Findings most suggestive of pulmonary edema with interval increase in small to moderate-sized bilateral effusions and worsening bibasilar heterogeneous/consolidative opacities, likely atelectasis. Electronically Signed   By: Sandi Mariscal M.D.   On: 02/02/2023 13:47        Scheduled Meds:  apixaban  2.5 mg Oral BID   diltiazem  180 mg Oral Daily   feeding supplement  237 mL Oral BID BM   furosemide  20 mg Intravenous BID   insulin aspart  0-5 Units Subcutaneous QHS   insulin aspart  0-9 Units Subcutaneous TID WC   levothyroxine  88 mcg Oral QAC breakfast   metoprolol succinate  12.5 mg Oral Daily   multivitamin with minerals  1 tablet Oral Daily   rosuvastatin  20 mg Oral QHS   Continuous Infusions:   LOS: 1 day      Sidney Ace, MD [facility]   If 7PM-7AM, please contact night-coverage  02/04/2023, 2:17 PM

## 2023-02-04 NOTE — Evaluation (Signed)
Occupational Therapy Evaluation Patient Details Name: [name] MRN: [id number] DOB: January 17, 1934 Today's Date: 02/04/2023   History of Present Illness [name] is a 89 year old female with hypertension, hyperlipidemia, atrial fibrillation on Eliquis, CAD, history of a stroke, mild pulmonary hypertension, stage IV neuroendocrine tumor favor carcinoid, on chornic who presents emergency department for chief concerns of shortness of breath.   Clinical Impression   Patient presenting with decreased Ind I self care,balance, functional mobility/transfers, endurance, and safety awareness. Patient reports living at home alone but son's having been trying to take turns staying with her as often as possible. Children assist her with IADLs and she endorse being able to perform self care tasks with increased time and use of RW. She ambulates short distances at home from recliner to bathroom ~ 10-15'.  Patient currently functioning at supervision - min guard with HHA in room. Pt is on 2Ls chronically and HR elevated to 130's and RR in 40's with stand and a few steps. Pt does fatigue quickly but wants to return home with family support and HH therapies.  Patient will benefit from acute OT to increase overall independence in the areas of ADLs, functional mobility, and safety awareness in order to safely discharge home.      Recommendations for follow up therapy are one component of a multi-disciplinary discharge planning process, led by the attending physician.  Recommendations may be updated based on patient status, additional functional criteria and insurance authorization.   Follow Up Recommendations  Home health OT     Assistance Recommended at Discharge Intermittent Supervision/Assistance  Patient can return home with the following A little help with walking and/or transfers;A little help with bathing/dressing/bathroom;Help with stairs or ramp for entrance;Assist for transportation;Assistance  with cooking/housework    Functional Status Assessment  Patient has had a recent decline in their functional status and demonstrates the ability to make significant improvements in function in a reasonable and predictable amount of time.  Equipment Recommendations  None recommended by OT       Precautions / Restrictions Precautions Precautions: Fall      Mobility Bed Mobility Overal bed mobility: Needs Assistance Bed Mobility: Supine to Sit, Sit to Supine     Supine to sit: Supervision Sit to supine: Supervision   General bed mobility comments: increased time to complete task    Transfers Overall transfer level: Needs assistance Equipment used: 1 person hand held assist Transfers: Sit to/from Stand, Bed to chair/wheelchair/BSC Sit to Stand: Min guard     Step pivot transfers: Min guard, Min assist            Balance Overall balance assessment: Needs assistance Sitting-balance support: Feet supported Sitting balance-Leahy Scale: Good     Standing balance support: Single extremity supported Standing balance-Leahy Scale: Fair                             ADL either performed or assessed with clinical judgement   ADL Overall ADL's : Needs assistance/impaired                         Toilet Transfer: Min guard;Supervision/safety;BSC/3in1   Toileting- Clothing Manipulation and Hygiene: Minimal assistance;Sit to/from stand               Vision Patient Visual Report: No change from baseline              Pertinent Vitals/Pain Pain Assessment  Pain Assessment: No/denies pain     Hand Dominance Left   Extremity/Trunk Assessment Upper Extremity Assessment Upper Extremity Assessment: Generalized weakness;Overall [facility] for tasks assessed   Lower Extremity Assessment Lower Extremity Assessment: Generalized weakness;Overall WFL for tasks assessed       Communication Communication Communication: HOH   Cognition Arousal/Alertness:  Awake/alert Behavior During Therapy: WFL for tasks assessed/performed Overall Cognitive Status: Within Functional Limits for tasks assessed                                                  Home Living Family/patient expects to be discharged to:: Private residence Living Arrangements: Alone Available Help at Discharge: Family;Available PRN/intermittently Type of Home: House Home Access: Stairs to enter [employer] of Steps: 8, back porch Entrance Stairs-Rails: Right;Left;Can reach both Home Layout: Able to live on main level with bedroom/bathroom;Multi-level     Bathroom Shower/Tub: Tub/shower unit         Home Equipment: Conservation officer, nature (2 wheels);Cane - single point;BSC/3in1;Shower seat;Grab bars - tub/shower;Rollator (4 wheels);Grab bars - toilet          Prior Functioning/Environment Prior Level of Function : Independent/Modified Independent;Needs assist             Mobility Comments: amb with RW at rehab per pt report ADLs Comments: Pt reports her children have been taking time staying with her to assist with IADL tasks and be available if she needs something.        OT Problem List: Decreased strength;Decreased activity tolerance;Decreased safety awareness;Impaired balance (sitting and/or standing);Decreased knowledge of use of DME or AE;Cardiopulmonary status limiting activity      OT Treatment/Interventions: Self-care/ADL training;Therapeutic exercise;Therapeutic activities;DME and/or AE instruction;Patient/family education;Balance training;Energy conservation    OT Goals(Current goals can be found in the care plan section) Acute Rehab OT Goals Patient Stated Goal: to feel better and go home OT Goal Formulation: With patient Time For Goal Achievement: 02/18/23 Potential to Achieve Goals: Good ADL Goals Pt Will Perform Grooming: with modified independence;standing Pt Will Perform Lower Body Dressing: with supervision;sit to/from  stand Pt Will Transfer to Toilet: with supervision;ambulating Pt Will Perform Toileting - Clothing Manipulation and hygiene: with supervision;sit to/from stand  OT Frequency: Min 2X/week       AM-PAC OT "6 Clicks" Daily Activity     Outcome Measure Help from another person eating meals?: None Help from another person taking care of personal grooming?: None Help from another person toileting, which includes using toliet, bedpan, or urinal?: A Little Help from another person bathing (including washing, rinsing, drying)?: A Little Help from another person to put on and taking off regular upper body clothing?: None Help from another person to put on and taking off regular lower body clothing?: A Little 6 Click Score: 21   End of Session Nurse Communication: Mobility status  Activity Tolerance: Patient tolerated treatment well Patient left: in bed;with call bell/phone within reach;with bed alarm set  OT Visit Diagnosis: Unsteadiness on feet (R26.81);Muscle weakness (generalized) (M62.81);History of falling (Z91.81)                Time: 2778-2423 OT Time Calculation (min): 17 min Charges:  OT General Charges $OT Visit: 1 Visit OT Evaluation $OT Eval Moderate Complexity: 1 Mod OT Treatments $Self Care/Home Management : 8-22 mins  Darleen Crocker, MS, OTR/L , CBIS ascom [phone]  02/04/23, 1:19 PM

## 2023-02-04 NOTE — ED Notes (Signed)
Pt assisted with wiping, x1 small BM noted in bedside commode with scant amount of urine present. Pt back into bed, purewick replaced. Pt positioned in high fowlers. Call light within reach, VSS. Meal tray delivered, pt sitting upright eating meal at this time. WCTM.

## 2023-02-04 NOTE — Plan of Care (Signed)
  Problem: Safety: Goal: Ability to remain free from injury will improve Outcome: Progressing   Problem: Skin Integrity: Goal: Risk for impaired skin integrity will decrease Outcome: Progressing   

## 2023-02-05 ENCOUNTER — Inpatient Hospital Stay: Payer: Medicare Other

## 2023-02-05 DIAGNOSIS — J9621 Acute and chronic respiratory failure with hypoxia: Secondary | ICD-10-CM | POA: Diagnosis not present

## 2023-02-05 LAB — GLUCOSE, CAPILLARY
Glucose-Capillary: 190 mg/dL — ABNORMAL HIGH (ref 70–99)
Glucose-Capillary: 293 mg/dL — ABNORMAL HIGH (ref 70–99)
Glucose-Capillary: 214 mg/dL — ABNORMAL HIGH (ref 70–99)
Glucose-Capillary: 315 mg/dL — ABNORMAL HIGH (ref 70–99)

## 2023-02-05 MED ORDER — INSULIN GLARGINE-YFGN 100 UNIT/ML ~~LOC~~ SOLN
5.0000 [IU] | Freq: Every day | SUBCUTANEOUS | Status: DC
  Administered 2023-02-05 – 2023-02-09 (×5): 5 [IU] via SUBCUTANEOUS
  Filled 2023-02-05 (×6): qty 0.05

## 2023-02-05 MED ORDER — BENZONATATE 100 MG PO CAPS
200.0000 mg | ORAL_CAPSULE | Freq: Three times a day (TID) | ORAL | Status: DC
  Administered 2023-02-05 – 2023-02-14 (×23): 200 mg via ORAL
  Filled 2023-02-05 (×24): qty 2

## 2023-02-05 MED ORDER — FUROSEMIDE 10 MG/ML IJ SOLN
INTRAMUSCULAR | Status: AC
  Administered 2023-02-05: 20 mg
  Filled 2023-02-05: qty 2

## 2023-02-05 MED ORDER — FLUTICASONE PROPIONATE 50 MCG/ACT NA SUSP
2.0000 | Freq: Every day | NASAL | Status: DC
  Administered 2023-02-05 – 2023-02-14 (×7): 2 via NASAL
  Filled 2023-02-05: qty 16

## 2023-02-05 MED ORDER — FUROSEMIDE 10 MG/ML IJ SOLN
40.0000 mg | Freq: Two times a day (BID) | INTRAMUSCULAR | Status: DC
  Administered 2023-02-05 – 2023-02-07 (×4): 40 mg via INTRAVENOUS
  Filled 2023-02-05 (×4): qty 4

## 2023-02-05 MED ORDER — GUAIFENESIN-DM 100-10 MG/5ML PO SYRP
5.0000 mL | ORAL_SOLUTION | ORAL | Status: DC | PRN
  Administered 2023-02-05 – 2023-02-10 (×2): 5 mL via ORAL
  Filled 2023-02-05 (×2): qty 10

## 2023-02-05 NOTE — Progress Notes (Signed)
PT Cancellation Note  Patient Details Name: [name] MRN: [id number] DOB: 07-06-1934   Cancelled Treatment:    Reason Eval/Treat Not Completed: Medical issues which prohibited therapy;Fatigue/lethargy limiting ability to participate;Patient declined, no reason specified Attempted to see pt this AM and she has very short of breath, anxious and not willing to attempt any activity with PT at that time, she agreed to try this afternoon if she was feeling better.  On return in the PM she had similar presentation; holding emesis bag, looking anxious and with quick, shallow breaths.  On 4L with O2 in the 86-90% range.  Pt reports feeling that there is no way she could try doing any activity with PT again and PT does agree that she did not appear to be in a condition congruent with much activity at all.  Family present and agrees that she is not ready or able to do PT at this time.  RN in room end of session, updated.   Kreg Shropshire, DPT 02/05/2023, 2:35 PM

## 2023-02-05 NOTE — Progress Notes (Signed)
PROGRESS NOTE    [name]  JSE:[id number] DOB: 1934-07-17 DOA: 02/06/2023 PCP: [name], FNP    Brief Narrative:  89 year old female with hypertension, hyperlipidemia, atrial fibrillation on Eliquis, CAD, history of a stroke, mild pulmonary hypertension, stage IV neuroendocrine tumor favor carcinoid, on chornic who presents emergency department for chief concerns of shortness of breath.   Initial vitals in the ED showed temperature 97.6, RR 19, HR 96, blood pressure 114/57, spO2 96 (6L [place]).   sNa is a129, K 4.2, chloride 93, bicarb 26, BUN 44, sCr 1.42, nonfasting blood glucose 311, eGFR 36, wbc 6.2, hgb 9.8, platelets 145.   ED treatment: furosemide 40 mg IV once time dose   Assessment & Plan:   Principal Problem:   Acute on chronic hypoxic respiratory failure (HCC) Active Problems:   Acute exacerbation of CHF (congestive heart failure) (HCC)   CAD (coronary artery disease)   Insulin dependent type 2 diabetes mellitus (HCC)   Stage 3b chronic kidney disease (CKD) ([place])   Carcinoid tumor of lung   HTN (hypertension)   Hypothyroid   Physical deconditioning   Protein-calorie malnutrition, severe   Atrial fibrillation, chronic (HCC)   Hyperlipidemia   Dysuria  * Acute on chronic hypoxic respiratory failure (HCC) Etiology likely related to decompensated heart failure Currently on 5 L Plan: Treatment with IV diuretic for decompensated heart failure Wean oxygen as tolerated, goal saturation greater than 92%    Acute exacerbation of diastolic CHF (congestive heart failure) ([place]) Patient has elevated BNP and clinical signs of fluid overload Complete echo done November 2023 Plan: Increase Lasix to 40 mg IV twice daily Strict ins and outs, daily weights Fluid restrict Telemetry  Permanent atrial fibrillation Provide cardiology clinic note rate control continues to be an issue.  Patient currently on metoprolol succinate and a long-acting Cardizem.  These  have been resumed with dose reduction given relative hypotension.  Maintain telemetry monitoring.  Rate control improved   Insulin dependent type 2 diabetes mellitus (HCC) - Insulin SSI with at bedtime coverage ordered   Stage 3b chronic kidney disease (CKD) (HCC) - At baseline   Dysuria POA Urinalysis with large leukocytes.  Patient endorsing dysuria Plan: Rocephin 1 g daily x 3 days   Protein-calorie malnutrition, severe - RD has been consulted   Hypothyroid - Levothyroxine 88 mcg daily before breakfast resumed   HTN (hypertension) - Hydralazine 5 mg IV every 8 hours as needed for SBP greater 175, 4 days ordered   DVT prophylaxis: Eliquis Code Status: Full Family Communication: son [name] [phone] on 2/7 Disposition Plan: Status is: Inpatient Remains inpatient appropriate because: Decompensated heart failure on IV diuretic   Level of care: Telemetry Cardiac  Consultants:  Palliative care  Procedures:  None  Antimicrobials: Rocephin   Subjective: Seen and examined.  Persistent shortness of breath.  Objective: Vitals:   02/04/23 2034 02/05/23 0019 02/05/23 0325 02/05/23 0741  BP:  103/62 (!) 103/59 115/65  Pulse:  (!) 106 92 100  Resp:  (!) 32 18 (!) 22  Temp:  97.8 F (36.6 C) 98.7 F (37.1 C) 98.3 F (36.8 C)  TempSrc:    Oral  SpO2: 90% 93% 92% 92%  Weight:      Height:        Intake/Output Summary (Last 24 hours) at 02/05/2023 1145 Last data filed at 02/05/2023 1113 Gross per 24 hour  Intake 1060 ml  Output 2100 ml  Net -1040 ml   [employer]  02/13/2023 1903  Weight: 54.3 kg    Examination:  General exam: Tachypneic Respiratory system: Bilateral coarse crackles.  Increased work of breathing.  5 L Cardiovascular system: S1-S2, regular rate, irregular rhythm, no murmurs, trace pedal edema Gastrointestinal system: Thin, soft, NT/ND, normal bowel sounds Central nervous system: Alert and oriented. No focal neurological  deficits. Extremities: Symmetric 5 x 5 power. Skin: No rashes, lesions or ulcers Psychiatry: Judgement and insight appear normal. Mood & affect appropriate.     Data Reviewed: I have personally reviewed following labs and imaging studies  CBC: Recent Labs  Lab 02/09/2023 1316 02/04/23 0545  WBC 6.2 8.1  HGB 9.8* 9.7*  HCT 30.4* 29.9*  MCV 88.1 88.5  PLT 145* 301   Basic Metabolic Panel: Recent Labs  Lab 02/25/2023 1316 02/04/23 0545  NA 129* 135  K 4.2 3.9  CL 93* 97*  CO2 26 28  GLUCOSE 311* 127*  BUN 44* 41*  CREATININE 1.42* 1.45*  CALCIUM 8.7* 8.7*   GFR: Estimated Creatinine Clearance: 22 mL/min (A) (by C-G formula based on SCr of 1.45 mg/dL (H)). Liver Function Tests: No results for input(s): "AST", "ALT", "ALKPHOS", "BILITOT", "PROT", "ALBUMIN" in the last 168 hours. No results for input(s): "LIPASE", "AMYLASE" in the last 168 hours. No results for input(s): "AMMONIA" in the last 168 hours. Coagulation Profile: No results for input(s): "INR", "PROTIME" in the last 168 hours. Cardiac Enzymes: No results for input(s): "CKTOTAL", "CKMB", "CKMBINDEX", "TROPONINI" in the last 168 hours. BNP (last 3 results) Recent Labs    01/12/23 1127  PROBNP 2,095*   HbA1C: No results for input(s): "HGBA1C" in the last 72 hours. CBG: Recent Labs  Lab 02/04/23 0754 02/04/23 1224 02/04/23 1714 02/04/23 2007 02/05/23 0743  GLUCAP 209* 170* 219* 278* 190*   Lipid Profile: No results for input(s): "CHOL", "HDL", "LDLCALC", "TRIG", "CHOLHDL", "LDLDIRECT" in the last 72 hours. Thyroid Function Tests: No results for input(s): "TSH", "T4TOTAL", "FREET4", "T3FREE", "THYROIDAB" in the last 72 hours. Anemia Panel: No results for input(s): "VITAMINB12", "FOLATE", "FERRITIN", "TIBC", "IRON", "RETICCTPCT" in the last 72 hours. Sepsis Labs: Recent Labs  Lab 02/01/2023 1902  PROCALCITON 0.20    Recent Results (from the past 240 hour(s))  Resp panel by RT-PCR (RSV, Flu A&B,  Covid) Anterior Nasal Swab     Status: None   Collection Time: 02/11/2023  5:58 PM   Specimen: Anterior Nasal Swab  Result Value Ref Range Status   SARS Coronavirus 2 by RT PCR NEGATIVE NEGATIVE Final    Comment: (NOTE) SARS-CoV-2 target nucleic acids are NOT DETECTED.  The SARS-CoV-2 RNA is generally detectable in upper respiratory specimens during the acute phase of infection. The lowest concentration of SARS-CoV-2 viral copies this assay can detect is 138 copies/mL. A negative result does not preclude SARS-Cov-2 infection and should not be used as the sole basis for treatment or other patient management decisions. A negative result may occur with  improper specimen collection/handling, submission of specimen other than nasopharyngeal swab, presence of viral mutation(s) within the areas targeted by this assay, and inadequate number of viral copies(<138 copies/mL). A negative result must be combined with clinical observations, patient history, and epidemiological information. The expected result is Negative.  Fact Sheet for Patients:  EntrepreneurPulse.com.au  Fact Sheet for Healthcare Providers:  IncredibleEmployment.be  This test is no t yet approved or cleared by the [place] FDA and  has been authorized for detection and/or diagnosis of SARS-CoV-2 by FDA under an Emergency Use Authorization (EUA). This  EUA will remain  in effect (meaning this test can be used) for the duration of the COVID-19 declaration under Section 564(b)(1) of the Act, 21 U.S.C.section 360bbb-3(b)(1), unless the authorization is terminated  or revoked sooner.       Influenza A by PCR NEGATIVE NEGATIVE Final   Influenza B by PCR NEGATIVE NEGATIVE Final    Comment: (NOTE) The Xpert Xpress SARS-CoV-2/FLU/RSV plus assay is intended as an aid in the diagnosis of influenza from Nasopharyngeal swab specimens and should not be used as a sole basis for treatment. Nasal  washings and aspirates are unacceptable for Xpert Xpress SARS-CoV-2/FLU/RSV testing.  Fact Sheet for Patients: EntrepreneurPulse.com.au  Fact Sheet for Healthcare Providers: IncredibleEmployment.be  This test is not yet approved or cleared by the [place] FDA and has been authorized for detection and/or diagnosis of SARS-CoV-2 by FDA under an Emergency Use Authorization (EUA). This EUA will remain in effect (meaning this test can be used) for the duration of the COVID-19 declaration under Section 564(b)(1) of the Act, 21 U.S.C. section 360bbb-3(b)(1), unless the authorization is terminated or revoked.     Resp Syncytial Virus by PCR NEGATIVE NEGATIVE Final    Comment: (NOTE) Fact Sheet for Patients: EntrepreneurPulse.com.au  Fact Sheet for Healthcare Providers: IncredibleEmployment.be  This test is not yet approved or cleared by the [place] FDA and has been authorized for detection and/or diagnosis of SARS-CoV-2 by FDA under an Emergency Use Authorization (EUA). This EUA will remain in effect (meaning this test can be used) for the duration of the COVID-19 declaration under Section 564(b)(1) of the Act, 21 U.S.C. section 360bbb-3(b)(1), unless the authorization is terminated or revoked.  Performed at [facility], [address], [place], [place] [address]          Radiology Studies: DG Chest [place] 1 View  Result Date: 02/05/2023 CLINICAL DATA:  Hypoxia EXAM: PORTABLE CHEST 1 VIEW COMPARISON:  Portable exam 1022 hours compared to 02/05/2023 FINDINGS: Enlargement of cardiac silhouette with pulmonary vascular congestion. Atherosclerotic calcification aorta. Bibasilar atelectasis and pleural effusions. Scattered interstitial infiltrates consistent with pulmonary edema. Overall pattern is similar to the previous study. No new segmental consolidation or pneumothorax. Bones demineralized  with BILATERAL glenohumeral degenerative changes. IMPRESSION: Persistent pulmonary edema/CHF with bibasilar effusions and atelectasis. Aortic Atherosclerosis (ICD10-I70.0). Electronically Signed   By: Lavonia Dana M.D.   On: 02/05/2023 10:48   DG Chest 2 View  Result Date: 02/01/2023 CLINICAL DATA:  Shortness of breath and weakness. EXAM: CHEST - 2 VIEW COMPARISON:  01/07/2023; 12/01/2022; 11/23/2022 FINDINGS: Grossly unchanged cardiac silhouette and mediastinal contours with atherosclerotic plaque within the thoracic aorta. Pulmonary vasculature is less distinct than present examination with cephalization of flow. Interval increase in small to moderate-sized bilateral pleural effusions with worsening bibasilar heterogeneous/consolidative opacities. Fluid is now seen tracking within the right minor and bilateral major fissures. No pneumothorax. No acute osseous abnormalities. IMPRESSION: Findings most suggestive of pulmonary edema with interval increase in small to moderate-sized bilateral effusions and worsening bibasilar heterogeneous/consolidative opacities, likely atelectasis. Electronically Signed   By: Sandi Mariscal M.D.   On: 02/08/2023 13:47        Scheduled Meds:  apixaban  2.5 mg Oral BID   diltiazem  180 mg Oral Daily   feeding supplement  237 mL Oral BID BM   fluticasone  2 spray Each Nare Daily   furosemide  40 mg Intravenous BID   insulin aspart  0-5 Units Subcutaneous QHS   insulin aspart  0-9 Units  Subcutaneous TID WC   insulin glargine-yfgn  5 Units Subcutaneous Daily   levothyroxine  88 mcg Oral QAC breakfast   metoprolol succinate  12.5 mg Oral Daily   multivitamin with minerals  1 tablet Oral Daily   rosuvastatin  20 mg Oral QHS   Continuous Infusions:  cefTRIAXone (ROCEPHIN)  IV Stopped (02/04/23 1624)     LOS: 2 days      Sidney Ace, MD [facility]   If 7PM-7AM, please contact night-coverage  02/05/2023, 11:45 AM

## 2023-02-05 NOTE — Progress Notes (Signed)
Initial Nutrition Assessment  DOCUMENTATION CODES:   Non-severe (moderate) malnutrition in context of chronic illness  INTERVENTION:   -MVI with minerals daily -Continue Ensure Enlive po TID, each supplement provides 350 kcal and 20 grams of protein -Continue regular diet for widest variety of meal selections  NUTRITION DIAGNOSIS:   Moderate Malnutrition related to chronic illness (CHF) as evidenced by moderate fat depletion, mild fat depletion, mild muscle depletion, moderate muscle depletion.  GOAL:   Patient will meet greater than or equal to 90% of their needs  MONITOR:   PO intake, Supplement acceptance  REASON FOR ASSESSMENT:   Consult Assessment of nutrition requirement/status  ASSESSMENT:   Pt with hypertension, hyperlipidemia, atrial fibrillation on Eliquis, CAD, history of a stroke, mild pulmonary hypertension, stage IV neuroendocrine tumor favor carcinoid, on chornic who presents for chief concerns of shortness of breath.  Pt admitted with respiratory secondary to CHF and a-fib.   Reviewed I/O's: -1.2 L x 24 hours and -1.7 L since admission  UOP: 2 L x 24 hours  Spoke with pt at bedside, who awoke during exam. She reports feeling better today and consumed about 50% of her breakfast this morning. Noted pt also consumed 100% of Ensure, which she reports she likes.   Per pt, she has a good appetite PTA. She usually consumes "a large breakfast and lunch" and shares she eats a small snack in the evening "so I don't go to bed on an empty stomach".   Reviewed wt hx; wt has been stable over the past 3 months. Suspect fluid status may be masking further weight loss as well as fat and muscle depletions.   Discussed importance of good meal and supplement intake to promote healing.   Medications reviewed and include cardizem and lasix.   Palliative care has been consulted for goals of care.   Medications reviewed.   Lab Results  Component Value Date   HGBA1C 5.8  (H) 11/12/2022   PTA DM medications are 50 mg sitagliptin daily and 0-9 units insulin aspart TID with meals.   Labs reviewed: CBGS: 209-470 (inpatient orders for glycemic control are 0-9 units insulin aspart daily at bedtime, 0-9 units insulin aspart TID with meals, and 5 units insulin glargine-yfgn daily).  NUTRITION - FOCUSED PHYSICAL EXAM:  Flowsheet Row Most Recent Value  Orbital Region Mild depletion  Upper Arm Region Moderate depletion  Thoracic and Lumbar Region Mild depletion  Buccal Region Moderate depletion  Temple Region Moderate depletion  Clavicle Bone Region Moderate depletion  Clavicle and Acromion Bone Region Moderate depletion  Scapular Bone Region Moderate depletion  Dorsal Hand Moderate depletion  Patellar Region Moderate depletion  Anterior Thigh Region Moderate depletion  Posterior Calf Region Moderate depletion  Edema (RD Assessment) Mild  Hair Reviewed  Eyes Reviewed  Mouth Reviewed  Skin Reviewed  Nails Reviewed       Diet Order:   Diet Order             Diet regular Room service appropriate? Yes; Fluid consistency: Thin; Fluid restriction: 2000 mL Fluid  Diet effective now                   EDUCATION NEEDS:   Education needs have been addressed  Skin:  Skin Assessment: Reviewed RN Assessment  Last BM:  02/04/23  Height:   Ht Readings from Last 1 Encounters:  02/09/2023 5\' 2"  (1.575 m)    Weight:   Wt Readings from Last 1 Encounters:  01/31/2023 54.3 kg  Ideal Body Weight:  50 kg  BMI:  Body mass index is 21.9 kg/m.  Estimated Nutritional Needs:   Kcal:  1600-1800  Protein:  80-95 grams  Fluid:  > 1.6 L    Loistine Chance, RD, LDN, [place] Registered Dietitian II Certified Diabetes Care and Education Specialist Please refer to [facility] for RD and/or RD on-call/weekend/after hours pager

## 2023-02-06 DIAGNOSIS — J9621 Acute and chronic respiratory failure with hypoxia: Secondary | ICD-10-CM | POA: Diagnosis not present

## 2023-02-06 DIAGNOSIS — E440 Moderate protein-calorie malnutrition: Secondary | ICD-10-CM | POA: Insufficient documentation

## 2023-02-06 LAB — MAGNESIUM: Magnesium: 1.5 mg/dL — ABNORMAL LOW (ref 1.7–2.4)

## 2023-02-06 LAB — GLUCOSE, CAPILLARY
Glucose-Capillary: 145 mg/dL — ABNORMAL HIGH (ref 70–99)
Glucose-Capillary: 272 mg/dL — ABNORMAL HIGH (ref 70–99)
Glucose-Capillary: 263 mg/dL — ABNORMAL HIGH (ref 70–99)
Glucose-Capillary: 179 mg/dL — ABNORMAL HIGH (ref 70–99)
Glucose-Capillary: 118 mg/dL — ABNORMAL HIGH (ref 70–99)

## 2023-02-06 LAB — TROPONIN I (HIGH SENSITIVITY): Troponin I (High Sensitivity): 10 ng/L (ref <18)

## 2023-02-06 LAB — POTASSIUM: Potassium: 3.2 mmol/L — ABNORMAL LOW (ref 3.5–5.1)

## 2023-02-06 MED ORDER — POTASSIUM CHLORIDE 10 MEQ/100ML IV SOLN
10.0000 meq | INTRAVENOUS | Status: AC
  Administered 2023-02-07 (×2): 10 meq via INTRAVENOUS
  Filled 2023-02-06 (×2): qty 100

## 2023-02-06 MED ORDER — SODIUM CHLORIDE 0.9 % IV BOLUS: 250.0000 mL | Freq: Once | INTRAVENOUS | Status: DC

## 2023-02-06 MED ORDER — SIMETHICONE 80 MG PO CHEW
160.0000 mg | CHEWABLE_TABLET | Freq: Once | ORAL | Status: AC
  Administered 2023-02-06: 160 mg via ORAL
  Filled 2023-02-06 (×2): qty 2

## 2023-02-06 MED ORDER — POTASSIUM CHLORIDE CRYS ER 20 MEQ PO TBCR
40.0000 meq | EXTENDED_RELEASE_TABLET | Freq: Once | ORAL | Status: AC
  Administered 2023-02-07: 40 meq via ORAL
  Filled 2023-02-06: qty 2

## 2023-02-06 MED ORDER — MAGNESIUM SULFATE 4 GM/100ML IV SOLN
4.0000 g | Freq: Once | INTRAVENOUS | Status: AC
  Administered 2023-02-07: 4 g via INTRAVENOUS
  Filled 2023-02-06: qty 100

## 2023-02-06 NOTE — Evaluation (Signed)
Physical Therapy Evaluation Patient Details Name: [name] MRN: [id number] DOB: 1934/03/07 Today's Date: 02/06/2023  History of Present Illness  [name] is a 89 year old female with hypertension, hyperlipidemia, atrial fibrillation on Eliquis, CAD, history of a stroke, mild pulmonary hypertension, stage IV neuroendocrine tumor favor carcinoid, on chornic who presents emergency department for chief concerns of shortness of breath.   Clinical Impression  Patient received in bed, she is agreeable to PT assessment. Patient reports she is feeling a little better. She requires min assist for bed mobility. She required increased time due to sob. She is able to stand with min A +2 and step pivot to recliner. HR up to 140s and O2 sats down to 88% on 5 liters. Bumped O2 up to 6 liters. Patient will continue to benefit from skilled PT to improve activity tolerance and strength for max independence.        Recommendations for follow up therapy are one component of a multi-disciplinary discharge planning process, led by the attending physician.  Recommendations may be updated based on patient status, additional functional criteria and insurance authorization.  Follow Up Recommendations Skilled nursing-short term rehab (<3 hours/day) Can patient physically be transported by private vehicle: No    Assistance Recommended at Discharge Frequent or constant Supervision/Assistance  Patient can return home with the following  A lot of help with walking and/or transfers;A lot of help with bathing/dressing/bathroom;Assist for transportation;Assistance with cooking/housework    Equipment Recommendations Rolling walker (2 wheels)  Recommendations for Other Services       Functional Status Assessment Patient has had a recent decline in their functional status and demonstrates the ability to make significant improvements in function in a reasonable and predictable amount of time.     Precautions  / Restrictions Precautions Precautions: Fall Restrictions Weight Bearing Restrictions: No      Mobility  Bed Mobility Overal bed mobility: Needs Assistance Bed Mobility: Supine to Sit     Supine to sit: Min assist, +2 for physical assistance, HOB elevated     General bed mobility comments: increased time to complete task    Transfers Overall transfer level: Needs assistance Equipment used: 2 person hand held assist Transfers: Sit to/from Stand, Bed to chair/wheelchair/BSC Sit to Stand: Min assist, +2 physical assistance   Step pivot transfers: Min assist, +2 physical assistance            Ambulation/Gait                  Stairs            Wheelchair Mobility    Modified Rankin (Stroke Patients Only)       Balance Overall balance assessment: Needs assistance Sitting-balance support: Feet supported Sitting balance-Leahy Scale: Good     Standing balance support: Bilateral upper extremity supported, During functional activity, Reliant on assistive device for balance Standing balance-Leahy Scale: Fair Standing balance comment: requires assist for balance and fall prevention                             Pertinent Vitals/Pain Pain Assessment Pain Assessment: Faces Faces Pain Scale: Hurts a little bit Pain Location: left rib area Pain Descriptors / Indicators: Discomfort, Sore Pain Intervention(s): Monitored during session, Repositioned    Home Living                          Prior Function  Hand Dominance        Extremity/Trunk Assessment   Upper Extremity Assessment Upper Extremity Assessment: Generalized weakness    Lower Extremity Assessment Lower Extremity Assessment: Generalized weakness    Cervical / Trunk Assessment Cervical / Trunk Assessment: Normal  Communication      Cognition Arousal/Alertness: Awake/alert Behavior During Therapy: WFL for tasks  assessed/performed Overall Cognitive Status: Within Functional Limits for tasks assessed                                          General Comments      Exercises     Assessment/Plan    PT Assessment Patient needs continued PT services  PT Problem List Decreased strength;Decreased activity tolerance;Decreased balance;Decreased mobility;Decreased knowledge of precautions;Cardiopulmonary status limiting activity       PT Treatment Interventions DME instruction;Therapeutic exercise;Gait training;Stair training;Functional mobility training;Therapeutic activities;Patient/family education;Balance training;Neuromuscular re-education    PT Goals (Current goals can be found in the Care Plan section)  Acute Rehab PT Goals Patient Stated Goal: not stated PT Goal Formulation: With patient Time For Goal Achievement: 02/20/23    Frequency Min 2X/week     Co-evaluation               AM-PAC PT "6 Clicks" Mobility  Outcome Measure Help needed turning from your back to your side while in a flat bed without using bedrails?: A Little Help needed moving from lying on your back to sitting on the side of a flat bed without using bedrails?: A Little Help needed moving to and from a bed to a chair (including a wheelchair)?: A Little Help needed standing up from a chair using your arms (e.g., wheelchair or bedside chair)?: A Little Help needed to walk in hospital room?: A Lot Help needed climbing 3-5 steps with a railing? : Total 6 Click Score: 15    End of Session Equipment Utilized During Treatment: Oxygen Activity Tolerance: Patient limited by fatigue Patient left: in chair;with bed alarm set;with call bell/phone within reach Nurse Communication: Mobility status PT Visit Diagnosis: Unsteadiness on feet (R26.81);Muscle weakness (generalized) (M62.81);Difficulty in walking, not elsewhere classified (R26.2);Other abnormalities of gait and mobility (R26.89)    Time:  6644-0347 PT Time Calculation (min) (ACUTE ONLY): 17 min   Charges:   PT Evaluation $PT Eval Moderate Complexity: 1 Mod          [name], PT, GCS 02/06/23,12:27 PM

## 2023-02-06 NOTE — Progress Notes (Signed)
PT Cancellation Note  Patient Details Name: [name] MRN: [id number] DOB: 1934-06-02   Cancelled Treatment:    Reason Eval/Treat Not Completed: Other (comment) Patient eating breakfast and asks that I return after she is done. Will re-attempt later.   Lennox Leikam 02/06/2023, 10:09 AM

## 2023-02-06 NOTE — Progress Notes (Signed)
Patient c/o right arm pain and chest pain. EKG done . NP at bedside with new orders.- see epic.

## 2023-02-06 NOTE — Consult Note (Signed)
   Heart Failure Nurse Navigator Note  HFpEF 70 to 75%.  Moderate left ventricular hypertrophy.  Moderately elevated pulmonary artery systolic pressures.  Mild mitral regurgitation.  Indeterminate diastolic dysfunction.  She presented to the emergency room with 3 to 4-day history of shortness of breath and worsening lower extremity edema.  BNP was 335.  Chest x-ray showed pulmonary edema with moderate pleural effusions.  Comorbidities:  Hypertension Hyperlipidemia Atrial fibrillation on NOAC Stroke Diabetes Chronic kidney disease stage III Hypothyroidism  Medications:  Apixaban 2.5 mg 2 times a day Diltiazem 180 mg daily Furosemide 40 mg IV 2 times a day Levothyroxine 88 mcg daily Metoprolol succinate 12 and half milligrams daily Crestor 20 mg at bedtime  Labs:  From February 7, sodium 135, potassium 3.9, chloride 97, CO2 28, BUN 41, creatinine 1.45, GFR 35. Weight is 55.3 kg Intake 581 mL Output 1550 mL   Initial meeting with patient and her son [name] who was at the bedside.  Patient states that she lives by herself but that her sons check on her frequently.  Discussed heart failure, what it means.  Discussed the importance of low-sodium diet and removing saltshaker from the table.  Patient states that she currently salts some of her foods, again went over the importance of removing the saltshaker from the table.  Son also states that she eats canned soups quite frequently and is something easier for her to fix.  Discussed making wiser choices with eating fresh and frozen fruits and vegetables and trying lean cuisine frozen dinners that are lower in sodium that would be something easy for her to fix.  Son felt that she lives too far out to get Meals on Wheels.  Went over fluid restriction, both patient and son do not feel that she goes over 64 ounces in a days time.  Also went over the importance of daily weights and what to report.  States that she does not weigh herself  daily as she does not always feel steady on her feet.  Made them aware of follow-up in the outpatient heart failure clinic but son states that she already has so many doctors and so many appointments he doubts that they will keep it, but he will discuss it with his family.  Given the living with heart failure teaching booklet, zone magnet, info on low-sodium and heart failure along with weight chart.  Son-Steve's phone number is [phone].  Pricilla Riffle RN CHFN

## 2023-02-06 NOTE — Telephone Encounter (Signed)
Checked pt's chart and saw that pt was admitted to the hospital 2/6.

## 2023-02-06 NOTE — Progress Notes (Signed)
Occupational Therapy Treatment Patient Details Name: [name] MRN: [id number] DOB: 12-08-1934 Today's Date: 02/06/2023   History of present illness [name] is a 89 year old female with hypertension, hyperlipidemia, atrial fibrillation on Eliquis, CAD, history of a stroke, mild pulmonary hypertension, stage IV neuroendocrine tumor favor carcinoid, on chornic who presents emergency department for chief concerns of shortness of breath.   OT comments  Upon entering the room, pt supine in bed with no c/o pain and agreeable to OT intervention. Pt on 4Ls via HFNC. PT washes face and hands with set up A. She then performs bed mobility with min A to EOB and needing increased time to rest. Pt desaturations to 87% and needing to be placed on 6Ls. Pt stands and performs stand pivot transfer to recliner chair with min A of 2. Pt unable to tolerate further activity at this point. HR increased to 140's with transfer. Pt agreeable to remain in recliner chair. Recommendation changed to SNF secondary to decreased endurance and strength.All needs within reach.   Recommendations for follow up therapy are one component of a multi-disciplinary discharge planning process, led by the attending physician.  Recommendations may be updated based on patient status, additional functional criteria and insurance authorization.    Follow Up Recommendations  Skilled nursing-short term rehab (<3 hours/day)     Assistance Recommended at Discharge Intermittent Supervision/Assistance  Patient can return home with the following  A little help with walking and/or transfers;A little help with bathing/dressing/bathroom;Help with stairs or ramp for entrance;Assist for transportation;Assistance with cooking/housework   Equipment Recommendations  Other (comment) (defer to next venue of care)       Precautions / Restrictions Precautions Precautions: Fall Restrictions Weight Bearing Restrictions: No        Mobility Bed Mobility Overal bed mobility: Needs Assistance Bed Mobility: Supine to Sit     Supine to sit: Min assist, +2 for physical assistance, HOB elevated          Transfers Overall transfer level: Needs assistance Equipment used: 2 person hand held assist Transfers: Sit to/from Stand, Bed to chair/wheelchair/BSC Sit to Stand: Min assist, +2 physical assistance     Step pivot transfers: Min assist, +2 physical assistance           Balance Overall balance assessment: Needs assistance Sitting-balance support: Feet supported Sitting balance-Leahy Scale: Good     Standing balance support: Bilateral upper extremity supported, During functional activity, Reliant on assistive device for balance Standing balance-Leahy Scale: Fair Standing balance comment: requires assist for balance and fall prevention                           ADL either performed or assessed with clinical judgement   ADL Overall ADL's : Needs assistance/impaired     Grooming: Wash/dry hands;Wash/dry face;Set up;Supervision/safety;Bed level                                      Extremity/Trunk Assessment Upper Extremity Assessment Upper Extremity Assessment: Generalized weakness   Lower Extremity Assessment Lower Extremity Assessment: Generalized weakness   Cervical / Trunk Assessment Cervical / Trunk Assessment: Normal    Vision Patient Visual Report: No change from baseline            Cognition Arousal/Alertness: Awake/alert Behavior During Therapy: WFL for tasks assessed/performed Overall Cognitive Status: Within Functional Limits for tasks assessed  Pertinent Vitals/ Pain       Pain Assessment Pain Assessment: Faces Faces Pain Scale: Hurts a little bit Pain Location: left rib area Pain Descriptors / Indicators: Discomfort, Sore Pain Intervention(s): Monitored during session,  Repositioned         Frequency  Min 2X/week        Progress Toward Goals  OT Goals(current goals can now be found in the care plan section)  Progress towards OT goals: Progressing toward goals  Acute Rehab OT Goals Patient Stated Goal: to get better OT Goal Formulation: With patient Time For Goal Achievement: 02/18/23 Potential to Achieve Goals: Good  Plan Frequency remains appropriate;Discharge plan needs to be updated    AM-PAC OT "6 Clicks" Daily Activity     Outcome Measure   Help from another person eating meals?: None Help from another person taking care of personal grooming?: None Help from another person toileting, which includes using toliet, bedpan, or urinal?: A Little Help from another person bathing (including washing, rinsing, drying)?: A Little Help from another person to put on and taking off regular upper body clothing?: None Help from another person to put on and taking off regular lower body clothing?: A Little 6 Click Score: 21    End of Session    OT Visit Diagnosis: Unsteadiness on feet (R26.81);Muscle weakness (generalized) (M62.81);History of falling (Z91.81)   Activity Tolerance Patient tolerated treatment well   Patient Left in bed;with call bell/phone within reach;with bed alarm set   Nurse Communication Mobility status        Time: 0388-8280 OT Time Calculation (min): 18 min  Charges: OT General Charges $OT Visit: 1 Visit OT Treatments $Self Care/Home Management : 8-22 mins  Deanna Crocker, MS, OTR/L , CBIS ascom [phone]  02/06/23, 1:15 PM

## 2023-02-06 NOTE — Inpatient Diabetes Management (Signed)
Inpatient Diabetes Program Recommendations  AACE/ADA: New Consensus Statement on Inpatient Glycemic Control   Target Ranges:  Prepandial:   less than 140 mg/dL      Peak postprandial:   less than 180 mg/dL (1-2 hours)      Critically ill patients:  140 - 180 mg/dL    Latest Reference Range & Units 02/05/23 07:43 02/05/23 11:49 02/05/23 16:19 02/05/23 19:56 02/06/23 08:14  Glucose-Capillary 70 - 99 mg/dL 190 (H) 293 (H) 214 (H) 315 (H) 145 (H)   Review of Glycemic Control  Diabetes history: DM2 Outpatient Diabetes medications: Januvia 50 mg daily, Novolog 0-9 units TID with meals Current orders for Inpatient glycemic control: Semglee 5 units daily, Novolog 0-9 units TID with meals  Inpatient Diabetes Program Recommendations:    Insulin: Please consider ordering Novolog 3 units TID with meals for meal coverage if patient eats at least 50% of meals.  Thanks, Barnie Alderman, RN, MSN, [place] Diabetes Coordinator Inpatient Diabetes Program [phone] (Team Pager from 8am to [place])

## 2023-02-06 NOTE — Progress Notes (Signed)
       CROSS COVER NOTE  NAME: MAKAELYN APONTE MRN: [id number] DOB : 10/02/1934    HPI/Events of Note   Patient with sudden onset midsternal chest pain radiating dons her right arm and up her right side of her face   Assessment and  Interventions   Assessment: Bedside getting EKG  patient burped and she said it was slightly better EKG reviewed by me a fib rate 115, No STEMI ;  Plan: Troponin 10 Simethicone 160 mg x1 Mag 1.5 - 4 gms ordered K 3.2 40 oral + 2 runs= 20 meq      Kathlene Cote NP [facility]

## 2023-02-06 NOTE — Progress Notes (Signed)
Palliative-   Consult received.  Palliative service is currently overwhelmed with consults therefore there will be a delay in the completion of this consult.  We will make all attempts to see patient as soon as possible.   Mariana Kaufman, AGNP-C Palliative Medicine  No charge

## 2023-02-06 NOTE — Progress Notes (Signed)
Patient requested for Tylenol for right arm pain and also request her PIV line be taken out of the right AC. New IV inserted on the LFA. Patient is resting comfortably right now. Will continue to monitor.

## 2023-02-06 NOTE — Care Management Important Message (Signed)
Important Message  Patient Details  Name: [name] MRN: [id number] Date of Birth: 27-Nov-1934   Medicare Important Message Given:  Yes     [name] 02/06/2023, 2:18 PM

## 2023-02-06 NOTE — Progress Notes (Signed)
PROGRESS NOTE    [name]  HWE:[id number] DOB: [birth year] DOA: 02/22/2023 PCP: [name], FNP    Brief Narrative:  89 year old female with hypertension, hyperlipidemia, atrial fibrillation on Eliquis, CAD, history of a stroke, mild pulmonary hypertension, stage IV neuroendocrine tumor favor carcinoid, on chornic who presents emergency department for chief concerns of shortness of breath.   Presentation consistent with acute decompensated diastolic heart failure  Assessment & Plan:   Principal Problem:   Acute on chronic hypoxic respiratory failure (HCC) Active Problems:   Acute exacerbation of CHF (congestive heart failure) (HCC)   CAD (coronary artery disease)   Insulin dependent type 2 diabetes mellitus (HCC)   Stage 3b chronic kidney disease (CKD) ([place])   Carcinoid tumor of lung   HTN (hypertension)   Hypothyroid   Physical deconditioning   Protein-calorie malnutrition, severe   Atrial fibrillation, chronic (HCC)   Hyperlipidemia   Dysuria   Protein-calorie malnutrition, moderate (HCC)  * Acute on chronic hypoxic respiratory failure (HCC) Etiology likely related to decompensated heart failure Currently on 5 L Plan: Treatment with IV diuretic for decompensated heart failure Wean oxygen as tolerated, goal saturation greater than 92%    Acute exacerbation of diastolic CHF (congestive heart failure) ([place]) Patient has elevated BNP and clinical signs of fluid overload Complete echo done November 2023 Plan: Continue Lasix 40 mg IV twice daily Strict ins and outs, daily weights Fluid restrict Telemetry  Permanent atrial fibrillation Provide cardiology clinic note rate control continues to be an issue.  Patient currently on metoprolol succinate and a long-acting Cardizem.  These have been resumed with dose reduction given relative hypotension.  Maintain telemetry monitoring.  Rate control improved   Insulin dependent type 2 diabetes mellitus (HCC) -  Insulin SSI with at bedtime coverage ordered   Stage 3b chronic kidney disease (CKD) (HCC) - At baseline   Dysuria POA Urinalysis with large leukocytes.  Patient endorsing dysuria Plan: Continue Rocephin 1 g daily x 3 days   Protein-calorie malnutrition, severe RD consult   Hypothyroid PTA send   HTN (hypertension) Cardizem and metoprolol as above   DVT prophylaxis: Eliquis Code Status: Full Family Communication: son [name] [phone] on 2/7, bedside 2/9 Disposition Plan: Status is: Inpatient Remains inpatient appropriate because: Decompensated heart failure on IV diuretic   Level of care: Telemetry Cardiac  Consultants:  Palliative care  Procedures:  None  Antimicrobials: Rocephin   Subjective: Seen and examined.  Shortness of breath slowly improving.  Remains dependent on 5 L  Objective: Vitals:   02/06/23 0500 02/06/23 0817 02/06/23 1214 02/06/23 1232  BP:  111/67  106/65  Pulse:  100  (!) 128  Resp:  18  16  Temp:  98.5 F (36.9 C)  98.7 F (37.1 C)  TempSrc:  Oral  Oral  SpO2:  93% (!) 88% 97%  Weight: 55.3 kg     Height:        Intake/Output Summary (Last 24 hours) at 02/06/2023 1321 Last data filed at 02/06/2023 1105 Gross per 24 hour  Intake 341.14 ml  Output 1150 ml  Net -808.86 ml   Filed Weights   02/24/2023 1903 02/06/23 0500  Weight: 54.3 kg 55.3 kg    Examination:  General exam: No acute distress.  Appears frail Respiratory system: Bibasilar crackles.  Normal work of breathing.  5 L Cardiovascular system: S1-S2, regular rate, irregular rhythm, no murmurs, trace pedal edema Gastrointestinal system: Thin, soft, NT/ND, normal bowel sounds Central nervous system: Alert and  oriented. No focal neurological deficits. Extremities: Symmetric 5 x 5 power. Skin: No rashes, lesions or ulcers Psychiatry: Judgement and insight appear normal. Mood & affect appropriate.     Data Reviewed: I have personally reviewed following labs and  imaging studies  CBC: Recent Labs  Lab 02/25/2023 1316 02/04/23 0545  WBC 6.2 8.1  HGB 9.8* 9.7*  HCT 30.4* 29.9*  MCV 88.1 88.5  PLT 145* 740   Basic Metabolic Panel: Recent Labs  Lab 02/11/2023 1316 02/04/23 0545  NA 129* 135  K 4.2 3.9  CL 93* 97*  CO2 26 28  GLUCOSE 311* 127*  BUN 44* 41*  CREATININE 1.42* 1.45*  CALCIUM 8.7* 8.7*   GFR: Estimated Creatinine Clearance: 22 mL/min (A) (by C-G formula based on SCr of 1.45 mg/dL (H)). Liver Function Tests: No results for input(s): "AST", "ALT", "ALKPHOS", "BILITOT", "PROT", "ALBUMIN" in the last 168 hours. No results for input(s): "LIPASE", "AMYLASE" in the last 168 hours. No results for input(s): "AMMONIA" in the last 168 hours. Coagulation Profile: No results for input(s): "INR", "PROTIME" in the last 168 hours. Cardiac Enzymes: No results for input(s): "CKTOTAL", "CKMB", "CKMBINDEX", "TROPONINI" in the last 168 hours. BNP (last 3 results) Recent Labs    01/12/23 1127  PROBNP 2,095*   HbA1C: No results for input(s): "HGBA1C" in the last 72 hours. CBG: Recent Labs  Lab 02/05/23 1149 02/05/23 1619 02/05/23 1956 02/06/23 0814 02/06/23 1223  GLUCAP 293* 214* 315* 145* 272*   Lipid Profile: No results for input(s): "CHOL", "HDL", "LDLCALC", "TRIG", "CHOLHDL", "LDLDIRECT" in the last 72 hours. Thyroid Function Tests: No results for input(s): "TSH", "T4TOTAL", "FREET4", "T3FREE", "THYROIDAB" in the last 72 hours. Anemia Panel: No results for input(s): "VITAMINB12", "FOLATE", "FERRITIN", "TIBC", "IRON", "RETICCTPCT" in the last 72 hours. Sepsis Labs: Recent Labs  Lab 02/07/2023 1902  PROCALCITON 0.20    Recent Results (from the past 240 hour(s))  Resp panel by RT-PCR (RSV, Flu A&B, Covid) Anterior Nasal Swab     Status: None   Collection Time: 02/08/2023  5:58 PM   Specimen: Anterior Nasal Swab  Result Value Ref Range Status   SARS Coronavirus 2 by RT PCR NEGATIVE NEGATIVE Final    Comment:  (NOTE) SARS-CoV-2 target nucleic acids are NOT DETECTED.  The SARS-CoV-2 RNA is generally detectable in upper respiratory specimens during the acute phase of infection. The lowest concentration of SARS-CoV-2 viral copies this assay can detect is 138 copies/mL. A negative result does not preclude SARS-Cov-2 infection and should not be used as the sole basis for treatment or other patient management decisions. A negative result may occur with  improper specimen collection/handling, submission of specimen other than nasopharyngeal swab, presence of viral mutation(s) within the areas targeted by this assay, and inadequate number of viral copies(<138 copies/mL). A negative result must be combined with clinical observations, patient history, and epidemiological information. The expected result is Negative.  Fact Sheet for Patients:  EntrepreneurPulse.com.au  Fact Sheet for Healthcare Providers:  IncredibleEmployment.be  This test is no t yet approved or cleared by the [place] FDA and  has been authorized for detection and/or diagnosis of SARS-CoV-2 by FDA under an Emergency Use Authorization (EUA). This EUA will remain  in effect (meaning this test can be used) for the duration of the COVID-19 declaration under Section 564(b)(1) of the Act, 21 U.S.C.section 360bbb-3(b)(1), unless the authorization is terminated  or revoked sooner.       Influenza A by PCR NEGATIVE NEGATIVE Final  Influenza B by PCR NEGATIVE NEGATIVE Final    Comment: (NOTE) The Xpert Xpress SARS-CoV-2/FLU/RSV plus assay is intended as an aid in the diagnosis of influenza from Nasopharyngeal swab specimens and should not be used as a sole basis for treatment. Nasal washings and aspirates are unacceptable for Xpert Xpress SARS-CoV-2/FLU/RSV testing.  Fact Sheet for Patients: EntrepreneurPulse.com.au  Fact Sheet for Healthcare  Providers: IncredibleEmployment.be  This test is not yet approved or cleared by the [place] FDA and has been authorized for detection and/or diagnosis of SARS-CoV-2 by FDA under an Emergency Use Authorization (EUA). This EUA will remain in effect (meaning this test can be used) for the duration of the COVID-19 declaration under Section 564(b)(1) of the Act, 21 U.S.C. section 360bbb-3(b)(1), unless the authorization is terminated or revoked.     Resp Syncytial Virus by PCR NEGATIVE NEGATIVE Final    Comment: (NOTE) Fact Sheet for Patients: EntrepreneurPulse.com.au  Fact Sheet for Healthcare Providers: IncredibleEmployment.be  This test is not yet approved or cleared by the [place] FDA and has been authorized for detection and/or diagnosis of SARS-CoV-2 by FDA under an Emergency Use Authorization (EUA). This EUA will remain in effect (meaning this test can be used) for the duration of the COVID-19 declaration under Section 564(b)(1) of the Act, 21 U.S.C. section 360bbb-3(b)(1), unless the authorization is terminated or revoked.  Performed at [facility], [address], [place], [place] [address]          Radiology Studies: DG Chest [place] 1 View  Result Date: 02/05/2023 CLINICAL DATA:  Hypoxia EXAM: PORTABLE CHEST 1 VIEW COMPARISON:  Portable exam 1022 hours compared to 02/02/2023 FINDINGS: Enlargement of cardiac silhouette with pulmonary vascular congestion. Atherosclerotic calcification aorta. Bibasilar atelectasis and pleural effusions. Scattered interstitial infiltrates consistent with pulmonary edema. Overall pattern is similar to the previous study. No new segmental consolidation or pneumothorax. Bones demineralized with BILATERAL glenohumeral degenerative changes. IMPRESSION: Persistent pulmonary edema/CHF with bibasilar effusions and atelectasis. Aortic Atherosclerosis (ICD10-I70.0). Electronically  Signed   By: Lavonia Dana M.D.   On: 02/05/2023 10:48        Scheduled Meds:  apixaban  2.5 mg Oral BID   benzonatate  200 mg Oral TID   diltiazem  180 mg Oral Daily   feeding supplement  237 mL Oral BID BM   fluticasone  2 spray Each Nare Daily   furosemide  40 mg Intravenous BID   insulin aspart  0-5 Units Subcutaneous QHS   insulin aspart  0-9 Units Subcutaneous TID WC   insulin glargine-yfgn  5 Units Subcutaneous Daily   levothyroxine  88 mcg Oral QAC breakfast   metoprolol succinate  12.5 mg Oral Daily   multivitamin with minerals  1 tablet Oral Daily   rosuvastatin  20 mg Oral QHS   Continuous Infusions:  cefTRIAXone (ROCEPHIN)  IV Stopped (02/05/23 1636)     LOS: 3 days      Sidney Ace, MD [facility]   If 7PM-7AM, please contact night-coverage  02/06/2023, 1:21 PM

## 2023-02-07 ENCOUNTER — Inpatient Hospital Stay: Payer: Medicare Other

## 2023-02-07 DIAGNOSIS — J9621 Acute and chronic respiratory failure with hypoxia: Secondary | ICD-10-CM | POA: Diagnosis not present

## 2023-02-07 LAB — BASIC METABOLIC PANEL
Anion gap: 10 (ref 5–15)
BUN: 39 mg/dL — ABNORMAL HIGH (ref 8–23)
CO2: 28 mmol/L (ref 22–32)
Calcium: 7.9 mg/dL — ABNORMAL LOW (ref 8.9–10.3)
Chloride: 87 mmol/L — ABNORMAL LOW (ref 98–111)
Creatinine, Ser: 1.4 mg/dL — ABNORMAL HIGH (ref 0.44–1.00)
GFR, Estimated: 37 mL/min — ABNORMAL LOW (ref >60)
Glucose, Bld: 178 mg/dL — ABNORMAL HIGH (ref 70–99)
Potassium: 4.1 mmol/L (ref 3.5–5.1)
Sodium: 125 mmol/L — ABNORMAL LOW (ref 135–145)

## 2023-02-07 LAB — GLUCOSE, CAPILLARY
Glucose-Capillary: 250 mg/dL — ABNORMAL HIGH (ref 70–99)
Glucose-Capillary: 276 mg/dL — ABNORMAL HIGH (ref 70–99)
Glucose-Capillary: 252 mg/dL — ABNORMAL HIGH (ref 70–99)
Glucose-Capillary: 203 mg/dL — ABNORMAL HIGH (ref 70–99)

## 2023-02-07 MED ORDER — IBUPROFEN 400 MG PO TABS
200.0000 mg | ORAL_TABLET | Freq: Once | ORAL | Status: AC
  Administered 2023-02-07: 200 mg via ORAL
  Filled 2023-02-07: qty 1

## 2023-02-07 MED ORDER — KETOROLAC TROMETHAMINE 15 MG/ML IJ SOLN
15.0000 mg | Freq: Once | INTRAMUSCULAR | Status: AC
  Administered 2023-02-07: 15 mg via INTRAVENOUS
  Filled 2023-02-07: qty 1

## 2023-02-07 MED ORDER — GABAPENTIN 300 MG PO CAPS
300.0000 mg | ORAL_CAPSULE | Freq: Two times a day (BID) | ORAL | Status: DC
  Administered 2023-02-07 – 2023-02-14 (×12): 300 mg via ORAL
  Filled 2023-02-07 (×12): qty 1

## 2023-02-07 MED ORDER — FUROSEMIDE 10 MG/ML IJ SOLN
40.0000 mg | Freq: Once | INTRAMUSCULAR | Status: AC
  Administered 2023-02-07: 40 mg via INTRAVENOUS
  Filled 2023-02-07: qty 4

## 2023-02-07 MED ORDER — FUROSEMIDE 10 MG/ML IJ SOLN
60.0000 mg | Freq: Two times a day (BID) | INTRAMUSCULAR | Status: DC
  Administered 2023-02-07 – 2023-02-09 (×4): 60 mg via INTRAVENOUS
  Filled 2023-02-07 (×4): qty 6

## 2023-02-07 NOTE — Progress Notes (Signed)
She just woke up and c/o inability to breath. she seems to be anxious right now. Her mews went red and rechecked came to yellow. She said she's going to try and sleep. Will stay with her for a while to make sure she calms down.  Randol Kern NP notified and she stated will should monitor.

## 2023-02-07 NOTE — Progress Notes (Signed)
PROGRESS NOTE    BEATA BEASON  ZHG:[id number] DOB: Oct 31, 1934 DOA: 01/29/2023 PCP: [name], FNP    Brief Narrative:  89 year old female with hypertension, hyperlipidemia, atrial fibrillation on Eliquis, CAD, history of a stroke, mild pulmonary hypertension, stage IV neuroendocrine tumor favor carcinoid, on chornic who presents emergency department for chief concerns of shortness of breath.   Presentation consistent with acute decompensated diastolic heart failure.  Fluid status has been difficult to manage.  Patient has been aggressively diuresed however repeat chest x-ray with persistent pulmonary edema pattern.  Assessment & Plan:   Principal Problem:   Acute on chronic hypoxic respiratory failure (HCC) Active Problems:   Acute exacerbation of CHF (congestive heart failure) (HCC)   CAD (coronary artery disease)   Insulin dependent type 2 diabetes mellitus (HCC)   Stage 3b chronic kidney disease (CKD) (HCC)   Carcinoid tumor of lung   HTN (hypertension)   Hypothyroid   Physical deconditioning   Protein-calorie malnutrition, severe   Atrial fibrillation, chronic (HCC)   Hyperlipidemia   Dysuria   Protein-calorie malnutrition, moderate (HCC)  * Acute on chronic hypoxic respiratory failure (HCC) Etiology likely related to decompensated heart failure Currently on 6 L.  Repeat chest x-ray with persistent pulmonary edema Plan: Treatment with IV diuretic for decompensated heart failure Wean oxygen as tolerated, goal saturation greater than 92%    Acute exacerbation of diastolic CHF (congestive heart failure) ([place]) Patient has elevated BNP and clinical signs of fluid overload Complete echo done November 2023 3.6 L net negative however chest x-ray with persistent pulmonary edema pattern Plan: Increase Lasix 60 mg IV twice daily Give 40 additional IV dose x 1 Strict ins and outs, daily weights Fluid restrict Telemetry If oxygen saturation or work of breathing  worsens will need to transfer to progressive care unit and initiate NIPPV  Permanent atrial fibrillation Provide cardiology clinic note rate control continues to be an issue.  Patient currently on metoprolol succinate and a long-acting Cardizem.  These have been resumed with dose reduction given relative hypotension.  Maintain telemetry monitoring.  Rate control improved   Insulin dependent type 2 diabetes mellitus (HCC) - Insulin SSI with at bedtime coverage ordered   Stage 3b chronic kidney disease (CKD) (HCC) - At baseline   Dysuria POA Urinalysis with large leukocytes.  Patient endorsing dysuria Plan: Completed 3 days of IV Rocephin   Protein-calorie malnutrition, severe RD consult   Hypothyroid PTA send   HTN (hypertension) Cardizem and metoprolol as above   DVT prophylaxis: Eliquis Code Status: Full Family Communication: son [name] [phone] on 2/7, bedside 2/9 Disposition Plan: Status is: Inpatient Remains inpatient appropriate because: Decompensated heart failure on IV diuretic   Level of care: Telemetry Cardiac  Consultants:  Palliative care  Procedures:  None  Antimicrobials: Rocephin   Subjective: Seen and.  Shortness of breath persists.  Remains on 6 L  Objective: Vitals:   02/07/23 0458 02/07/23 0810 02/07/23 0848 02/07/23 1145  BP:  104/66  (!) 91/54  Pulse:  (!) 105  (!) 107  Resp: 18 (!) 21 20 (!) 21  Temp:  97.9 F (36.6 C)    TempSrc:      SpO2:  93%  98%  Weight:      Height:        Intake/Output Summary (Last 24 hours) at 02/07/2023 1326 Last data filed at 02/07/2023 1149 Gross per 24 hour  Intake 780.06 ml  Output 1050 ml  Net -269.94 ml   Danley Danker  Weights   02/06/2023 1903 02/06/23 0500 02/07/23 0318  Weight: 54.3 kg 55.3 kg 56 kg    Examination:  General exam: Mild respiratory distress.  Frail-appearing Respiratory system: Bibasilar crackles.  Increased work of breathing.  6 L Cardiovascular system: S1-S2, tachycardic,  irregular rhythm, no murmurs, no pedal edema Gastrointestinal system: Thin, soft, NT/ND, normal bowel sounds Central nervous system: Alert and oriented. No focal neurological deficits. Extremities: Symmetric 5 x 5 power. Skin: No rashes, lesions or ulcers Psychiatry: Judgement and insight appear normal. Mood & affect appropriate.     Data Reviewed: I have personally reviewed following labs and imaging studies  CBC: Recent Labs  Lab 02/14/2023 1316 02/04/23 0545  WBC 6.2 8.1  HGB 9.8* 9.7*  HCT 30.4* 29.9*  MCV 88.1 88.5  PLT 145* 045   Basic Metabolic Panel: Recent Labs  Lab 01/30/2023 1316 02/04/23 0545 02/06/23 2158 02/07/23 0225  NA 129* 135  --  125*  K 4.2 3.9 3.2* 4.1  CL 93* 97*  --  87*  CO2 26 28  --  28  GLUCOSE 311* 127*  --  178*  BUN 44* 41*  --  39*  CREATININE 1.42* 1.45*  --  1.40*  CALCIUM 8.7* 8.7*  --  7.9*  MG  --   --  1.5*  --    GFR: Estimated Creatinine Clearance: 22.8 mL/min (A) (by C-G formula based on SCr of 1.4 mg/dL (H)). Liver Function Tests: No results for input(s): "AST", "ALT", "ALKPHOS", "BILITOT", "PROT", "ALBUMIN" in the last 168 hours. No results for input(s): "LIPASE", "AMYLASE" in the last 168 hours. No results for input(s): "AMMONIA" in the last 168 hours. Coagulation Profile: No results for input(s): "INR", "PROTIME" in the last 168 hours. Cardiac Enzymes: No results for input(s): "CKTOTAL", "CKMB", "CKMBINDEX", "TROPONINI" in the last 168 hours. BNP (last 3 results) Recent Labs    01/12/23 1127  PROBNP 2,095*   HbA1C: No results for input(s): "HGBA1C" in the last 72 hours. CBG: Recent Labs  Lab 02/06/23 1604 02/06/23 1940 02/06/23 2138 02/07/23 0809 02/07/23 1151  GLUCAP 263* 179* 118* 250* 276*   Lipid Profile: No results for input(s): "CHOL", "HDL", "LDLCALC", "TRIG", "CHOLHDL", "LDLDIRECT" in the last 72 hours. Thyroid Function Tests: No results for input(s): "TSH", "T4TOTAL", "FREET4", "T3FREE",  "THYROIDAB" in the last 72 hours. Anemia Panel: No results for input(s): "VITAMINB12", "FOLATE", "FERRITIN", "TIBC", "IRON", "RETICCTPCT" in the last 72 hours. Sepsis Labs: Recent Labs  Lab 02/11/2023 1902  PROCALCITON 0.20    Recent Results (from the past 240 hour(s))  Resp panel by RT-PCR (RSV, Flu A&B, Covid) Anterior Nasal Swab     Status: None   Collection Time: 02/11/2023  5:58 PM   Specimen: Anterior Nasal Swab  Result Value Ref Range Status   SARS Coronavirus 2 by RT PCR NEGATIVE NEGATIVE Final    Comment: (NOTE) SARS-CoV-2 target nucleic acids are NOT DETECTED.  The SARS-CoV-2 RNA is generally detectable in upper respiratory specimens during the acute phase of infection. The lowest concentration of SARS-CoV-2 viral copies this assay can detect is 138 copies/mL. A negative result does not preclude SARS-Cov-2 infection and should not be used as the sole basis for treatment or other patient management decisions. A negative result may occur with  improper specimen collection/handling, submission of specimen other than nasopharyngeal swab, presence of viral mutation(s) within the areas targeted by this assay, and inadequate number of viral copies(<138 copies/mL). A negative result must be combined with clinical observations, patient  history, and epidemiological information. The expected result is Negative.  Fact Sheet for Patients:  EntrepreneurPulse.com.au  Fact Sheet for Healthcare Providers:  IncredibleEmployment.be  This test is no t yet approved or cleared by the [place] FDA and  has been authorized for detection and/or diagnosis of SARS-CoV-2 by FDA under an Emergency Use Authorization (EUA). This EUA will remain  in effect (meaning this test can be used) for the duration of the COVID-19 declaration under Section 564(b)(1) of the Act, 21 U.S.C.section 360bbb-3(b)(1), unless the authorization is terminated  or revoked sooner.        Influenza A by PCR NEGATIVE NEGATIVE Final   Influenza B by PCR NEGATIVE NEGATIVE Final    Comment: (NOTE) The Xpert Xpress SARS-CoV-2/FLU/RSV plus assay is intended as an aid in the diagnosis of influenza from Nasopharyngeal swab specimens and should not be used as a sole basis for treatment. Nasal washings and aspirates are unacceptable for Xpert Xpress SARS-CoV-2/FLU/RSV testing.  Fact Sheet for Patients: EntrepreneurPulse.com.au  Fact Sheet for Healthcare Providers: IncredibleEmployment.be  This test is not yet approved or cleared by the [place] FDA and has been authorized for detection and/or diagnosis of SARS-CoV-2 by FDA under an Emergency Use Authorization (EUA). This EUA will remain in effect (meaning this test can be used) for the duration of the COVID-19 declaration under Section 564(b)(1) of the Act, 21 U.S.C. section 360bbb-3(b)(1), unless the authorization is terminated or revoked.     Resp Syncytial Virus by PCR NEGATIVE NEGATIVE Final    Comment: (NOTE) Fact Sheet for Patients: EntrepreneurPulse.com.au  Fact Sheet for Healthcare Providers: IncredibleEmployment.be  This test is not yet approved or cleared by the [place] FDA and has been authorized for detection and/or diagnosis of SARS-CoV-2 by FDA under an Emergency Use Authorization (EUA). This EUA will remain in effect (meaning this test can be used) for the duration of the COVID-19 declaration under Section 564(b)(1) of the Act, 21 U.S.C. section 360bbb-3(b)(1), unless the authorization is terminated or revoked.  Performed at [facility], [address] [place], [place] [address]          Radiology Studies: DG Wrist Complete Right  Result Date: 02/07/2023 CLINICAL DATA:  Right wrist pain in the scaphoid region. EXAM: RIGHT WRIST - COMPLETE 3+ VIEW COMPARISON:  Right wrist radiographs 11/22/2022  and right wrist CT 11/23/2022 FINDINGS: Chondrocalcinosis and moderate degenerative changes at the wrist are similar to the prior studies. No acute fracture, dislocation, or destructive osseous process is identified. The bones are osteopenic. There is mild soft tissue swelling about the wrist. IMPRESSION: No acute osseous abnormality identified.  Chronic findings as above. Electronically Signed   By: Logan Bores M.D.   On: 02/07/2023 10:13        Scheduled Meds:  apixaban  2.5 mg Oral BID   benzonatate  200 mg Oral TID   diltiazem  180 mg Oral Daily   feeding supplement  237 mL Oral BID BM   fluticasone  2 spray Each Nare Daily   furosemide  40 mg Intravenous Once   furosemide  60 mg Intravenous BID   gabapentin  300 mg Oral BID   insulin aspart  0-5 Units Subcutaneous QHS   insulin aspart  0-9 Units Subcutaneous TID WC   insulin glargine-yfgn  5 Units Subcutaneous Daily   levothyroxine  88 mcg Oral QAC breakfast   metoprolol succinate  12.5 mg Oral Daily   multivitamin with minerals  1 tablet Oral Daily  rosuvastatin  20 mg Oral QHS   Continuous Infusions:     LOS: 4 days      Sidney Ace, MD [facility]   If 7PM-7AM, please contact night-coverage  02/07/2023, 1:26 PM

## 2023-02-08 DIAGNOSIS — I350 Nonrheumatic aortic (valve) stenosis: Secondary | ICD-10-CM | POA: Diagnosis not present

## 2023-02-08 DIAGNOSIS — I4821 Permanent atrial fibrillation: Secondary | ICD-10-CM | POA: Diagnosis not present

## 2023-02-08 DIAGNOSIS — Z7189 Other specified counseling: Secondary | ICD-10-CM

## 2023-02-08 DIAGNOSIS — J9621 Acute and chronic respiratory failure with hypoxia: Secondary | ICD-10-CM | POA: Diagnosis not present

## 2023-02-08 DIAGNOSIS — I25118 Atherosclerotic heart disease of native coronary artery with other forms of angina pectoris: Secondary | ICD-10-CM | POA: Diagnosis not present

## 2023-02-08 DIAGNOSIS — N1832 Chronic kidney disease, stage 3b: Secondary | ICD-10-CM

## 2023-02-08 DIAGNOSIS — Z515 Encounter for palliative care: Secondary | ICD-10-CM

## 2023-02-08 DIAGNOSIS — J9601 Acute respiratory failure with hypoxia: Secondary | ICD-10-CM | POA: Diagnosis not present

## 2023-02-08 LAB — BASIC METABOLIC PANEL
Anion gap: 11 (ref 5–15)
BUN: 44 mg/dL — ABNORMAL HIGH (ref 8–23)
CO2: 28 mmol/L (ref 22–32)
Calcium: 8.1 mg/dL — ABNORMAL LOW (ref 8.9–10.3)
Chloride: 92 mmol/L — ABNORMAL LOW (ref 98–111)
Creatinine, Ser: 1.39 mg/dL — ABNORMAL HIGH (ref 0.44–1.00)
GFR, Estimated: 37 mL/min — ABNORMAL LOW (ref >60)
Glucose, Bld: 184 mg/dL — ABNORMAL HIGH (ref 70–99)
Potassium: 3.3 mmol/L — ABNORMAL LOW (ref 3.5–5.1)
Sodium: 131 mmol/L — ABNORMAL LOW (ref 135–145)

## 2023-02-08 LAB — GLUCOSE, CAPILLARY
Glucose-Capillary: 173 mg/dL — ABNORMAL HIGH (ref 70–99)
Glucose-Capillary: 169 mg/dL — ABNORMAL HIGH (ref 70–99)
Glucose-Capillary: 126 mg/dL — ABNORMAL HIGH (ref 70–99)
Glucose-Capillary: 220 mg/dL — ABNORMAL HIGH (ref 70–99)

## 2023-02-08 LAB — BRAIN NATRIURETIC PEPTIDE: B Natriuretic Peptide: 293.7 pg/mL — ABNORMAL HIGH (ref 0.0–100.0)

## 2023-02-08 LAB — D-DIMER, QUANTITATIVE: D-Dimer, Quant: 6.25 ug/mL-FEU — ABNORMAL HIGH (ref 0.00–0.50)

## 2023-02-08 LAB — MAGNESIUM: Magnesium: 2.2 mg/dL (ref 1.7–2.4)

## 2023-02-08 MED ORDER — MAGNESIUM SULFATE 2 GM/50ML IV SOLN
2.0000 g | Freq: Once | INTRAVENOUS | Status: AC
  Administered 2023-02-08: 2 g via INTRAVENOUS
  Filled 2023-02-08: qty 50

## 2023-02-08 MED ORDER — MORPHINE SULFATE (PF) 2 MG/ML IV SOLN
2.0000 mg | INTRAVENOUS | Status: DC | PRN
  Administered 2023-02-08 – 2023-02-15 (×11): 2 mg via INTRAVENOUS
  Filled 2023-02-08 (×11): qty 1

## 2023-02-08 MED ORDER — POTASSIUM CHLORIDE 10 MEQ/100ML IV SOLN
10.0000 meq | INTRAVENOUS | Status: DC
  Administered 2023-02-08: 10 meq via INTRAVENOUS
  Filled 2023-02-08 (×4): qty 100

## 2023-02-08 MED ORDER — METOLAZONE 5 MG PO TABS
5.0000 mg | ORAL_TABLET | Freq: Once | ORAL | Status: AC
  Administered 2023-02-08: 5 mg via ORAL
  Filled 2023-02-08: qty 1

## 2023-02-08 NOTE — Consult Note (Signed)
Cardiology Consultation   Patient ID: BRODI NERY MRN: [id number]; DOB: 11-Feb-1934  Admit date: 02/08/2023 Date of Consult: 02/08/2023  PCP:  [name], [place] [facility] Cardiologist:  Einar Gip  Patient Profile:   [name] is a 89 y.o. female with a hx of low-grade lung carcinoid, severe esophageal dysmotility, permanent Afib, moderate diffuse CAD on cath 2017, hypercholesterolemia, remote stroke, chronic diastolic heart failure, moderate aortic stenosis, asymptomatic right subclavian artery stenosis, HTN, DM2, CKD stage 3, sick euthyroid syndrome who is being seen 02/08/2023 for the evaluation of CHF at the request of Dr. Priscella Mann.  History of Present Illness:   [name] is followed by Dr. Einar Gip for the above cardiac issues.   Cath in 2007 showed Mid LAD 50-60%, Diffuse distal LAD disease, D1 ostial 80%, mid 60% stenosis. Normal Circumflex and RCA by Cath 10/07.   The patient was first diagnosed with Afib back in 2013 and started on BB therapy and Xarelto. Since that time afib has been difficult to control and is now permanent afib. Patient was later switched to Eliquis 2.5mg  BID and rate control medication with metoprolol and diltiazem. Amiodarone was stopped for sick euthyroid syndrome.  Heart cath in 2017 showed mild to mod CAD. Proximal LAD with a 40-50% stenosis, in some views at most 50-60% stenosed and calcified. Small circumflex, mildly diseased large right coronary artery. RHC showed mild pulmonary artery pressure. Echo 2017 showed LVEF 50-55%, G1DD, no WMA, moderate AS, mild MR.   In the last few months the patient has been admitted twice for acute respiratory failure. Echo 10/2022 during admission for acute respiratory failure, Acute heart failure, hyponatremia and Afib RVR showed LVEF 70-75%, moderate aortic valve thickening, mean gradient 22.22mmHg, moderate LVH, normal RVSF, mild MR.   She was readmitted in December she presented  with acute respiratory failure. She was treated for HCAP and anemia  s/p blood transfusion with improvement. She was discharged with 2L O2. She was made DNR.   She was seen in the office 01/12/23 and no changes were made.   The patient presented to the ER on 02/16/2023 with worsening shortness of breath. In the ER she required 6L O2. Labs showed NA 129, Scr 1.42, Hgb 9.8. BNP 335. CXR showed pulmonary edema with small to moderate sized bilateral effusions. EKG showed rate controlled. The patient was given IV lasix admitted.   Repeat CXR 2/10 showed pulmonary edema. Afib rates have increased to 100-120s. Lasix was increased and has been requiring intermittent. BIPAP. Cardiology was asked to see for difficult to manage volume status.  Past Medical History:  Diagnosis Date   Acute diverticulitis 06/2017   Anemia 06/2017   CAD (coronary artery disease) 03/13/2012   Diabetes mellitus    Dysrhythmia    ATRIAL FIB   Fall 07/07/2017   HTN (hypertension) 03/13/2012   Hypothyroid 03/13/2012   Lung cancer ([place])    Shortness of breath    Stroke ([place])    Syncope and collapse 04/02/2016   Type II or unspecified type diabetes mellitus without mention of complication, not stated as uncontrolled 03/13/2012    Past Surgical History:  Procedure Laterality Date   ABDOMINAL HYSTERECTOMY     BIOPSY  06/13/2021   Procedure: BIOPSY;  Surgeon: Ronnette Juniper, MD;  Location: [facility];  Service: Gastroenterology;;   BREAST EXCISIONAL BIOPSY Left    BREAST SURGERY     benign tumors removed in [place]  CARDIOVERSION  04/20/2012   Procedure: CARDIOVERSION;  Surgeon: Laverda Page, MD;  Location: [facility];  Service: Cardiovascular;  Laterality: N/A;   CARDIOVERSION N/A 05/10/2013   Procedure: CARDIOVERSION;  Surgeon: Laverda Page, MD;  Location: [place];  Service: Cardiovascular;  Laterality: N/A;   CHOLECYSTECTOMY     COLONOSCOPY WITH PROPOFOL N/A 06/13/2021   Procedure:  COLONOSCOPY WITH PROPOFOL;  Surgeon: Ronnette Juniper, MD;  Location: [facility];  Service: Gastroenterology;  Laterality: N/A;   ESOPHAGOGASTRODUODENOSCOPY (EGD) WITH PROPOFOL N/A 06/13/2021   Procedure: ESOPHAGOGASTRODUODENOSCOPY (EGD) WITH PROPOFOL;  Surgeon: Ronnette Juniper, MD;  Location: [facility];  Service: Gastroenterology;  Laterality: N/A;   HEMORRHOID SURGERY     jaw replacement     LEFT HEART CATHETERIZATION WITH CORONARY ANGIOGRAM N/A 04/20/2015   Procedure: LEFT HEART CATHETERIZATION WITH CORONARY ANGIOGRAM;  Surgeon: Adrian Prows, MD;  Location: [facility] C[facility];  Service: Cardiovascular;  Laterality: N/A;   tumors     back of head     Home Medications:  Prior to Admission medications   Medication Sig Start Date End Date Taking? Authorizing Provider  acetaminophen (TYLENOL) 325 MG tablet Take 2 tablets (650 mg total) by mouth every 6 (six) hours as needed for mild pain, fever or headache. 11/26/22  Yes Elgergawy, Silver Huguenin, MD  apixaban (ELIQUIS) 2.5 MG TABS tablet Take 1 tablet (2.5 mg total) by mouth 2 (two) times daily. 01/20/23  Yes Adrian Prows, MD  budesonide (PULMICORT) 0.5 MG/2ML nebulizer solution Take 2 mLs (0.5 mg total) by nebulization 2 (two) times daily. 11/26/22  Yes Elgergawy, Silver Huguenin, MD  calcium carbonate (SUPER CALCIUM) 1500 (600 Ca) MG TABS tablet Take 600 mg of elemental calcium by mouth daily with breakfast.   Yes [provider]  diltiazem (DILACOR XR) 180 MG 24 hr capsule Take 180 mg by mouth daily.   Yes [provider]  furosemide (LASIX) 20 MG tablet Take 1 tablet (20 mg total) by mouth daily. 11/26/22 11/26/23 Yes Elgergawy, Silver Huguenin, MD  insulin aspart (NOVOLOG) 100 UNIT/ML injection Inject 0-9 Units into the skin 3 (three) times daily with meals. 11/26/22  Yes Elgergawy, Silver Huguenin, MD  ipratropium-albuterol (DUONEB) 0.5-2.5 (3) MG/3ML SOLN Take 3 mLs by nebulization every 6 (six) hours as needed. 11/26/22  Yes Elgergawy, Silver Huguenin, MD  levothyroxine  (SYNTHROID) 88 MCG tablet Take 88 mcg by mouth daily. 07/25/22  Yes [provider]  magnesium oxide (MAG-OX) 400 (240 Mg) MG tablet Take 400 mg by mouth daily.   Yes [provider]  metoprolol succinate (TOPROL-XL) 25 MG 24 hr tablet Take 25 mg by mouth daily.   Yes [provider]  Multiple Vitamin (MULTIVITAMIN WITH MINERALS) TABS tablet Take 1 tablet by mouth daily. 11/27/22  Yes Elgergawy, Silver Huguenin, MD  polyethylene glycol (MIRALAX / GLYCOLAX) 17 g packet Take 17 g by mouth daily as needed for mild constipation. 12/09/22  Yes Richarda Osmond, MD  rosuvastatin (CRESTOR) 20 MG tablet Take 20 mg by mouth daily.   Yes [provider]  sitaGLIPtin (JANUVIA) 50 MG tablet Take 50 mg by mouth daily.   Yes [provider]  cefUROXime (CEFTIN) 500 MG tablet Take one tablet by mouth every 12 hours for 7 days. Patient not taking: Reported on 02/04/2023 01/23/23   [provider]  Fluocinolone Acetonide 0.01 % OIL Place 5 drops in ear(s) daily. Patient not taking: Reported on 02/04/2023 07/25/22   [provider]  Insulin Pen Needle (NOVOFINE  PLUS PEN NEEDLE) 32G X 4 MM MISC ONE 04/10/22   [provider]  lactose free nutrition (BOOST) LIQD Take 237 mLs by mouth daily.    [provider]  metoprolol succinate (TOPROL-XL) 100 MG 24 hr tablet Take 1 tablet (100 mg total) by mouth daily. Take with or immediately following a meal. Patient not taking: Reported on 02/04/2023 01/12/23 07/11/23  Adrian Prows, MD  Skin Protectants, Misc. (EUCERIN) cream Apply 1 Application topically 2 (two) times daily.    [provider]    Inpatient Medications: Scheduled Meds:  apixaban  2.5 mg Oral BID   benzonatate  200 mg Oral TID   diltiazem  180 mg Oral Daily   feeding supplement  237 mL Oral BID BM   fluticasone  2 spray Each Nare Daily   furosemide  60 mg Intravenous BID   gabapentin  300 mg Oral BID   insulin aspart  0-5 Units  Subcutaneous QHS   insulin aspart  0-9 Units Subcutaneous TID WC   insulin glargine-yfgn  5 Units Subcutaneous Daily   levothyroxine  88 mcg Oral QAC breakfast   metolazone  5 mg Oral Once   metoprolol succinate  12.5 mg Oral Daily   multivitamin with minerals  1 tablet Oral Daily   rosuvastatin  20 mg Oral QHS   Continuous Infusions:  PRN Meds: guaiFENesin-dextromethorphan, ipratropium-albuterol, metoprolol tartrate, morphine injection, ondansetron **OR** ondansetron (ZOFRAN) IV, polyethylene glycol, senna-docusate  Allergies:    Allergies  Allergen Reactions   Zofran [Ondansetron] Other (See Comments)    Made me deathly sick   Codeine Nausea And Vomiting   Hydrocodone-Acetaminophen Nausea And Vomiting    Social History:   Social History   Socioeconomic History   Marital status: Widowed    Spouse name: Not on file   Number of children: 3   Years of education: Not on file   Highest education level: Not on file  Occupational History   Not on file  Tobacco Use   Smoking status: Never   Smokeless tobacco: Never  Vaping Use   Vaping Use: Never used  Substance and Sexual Activity   Alcohol use: No   Drug use: No   Sexual activity: Yes    Birth control/protection: None  Other Topics Concern   Not on file  Social History Narrative   Not on file   Social Determinants of Health   Financial Resource Strain: Not on file  Food Insecurity: No Food Insecurity (02/04/2023)   Hunger Vital Sign    Worried About Running Out of Food in the Last Year: Never true    Ran Out of Food in the Last Year: Never true  Transportation Needs: No Transportation Needs (02/04/2023)   PRAPARE - Hydrologist (Medical): No    Lack of Transportation (Non-Medical): No  Physical Activity: Not on file  Stress: Not on file  Social Connections: Not on file  Intimate Partner Violence: Not At Risk (02/04/2023)   Humiliation, Afraid, Rape, and Kick questionnaire    Fear of  Current or Ex-Partner: No    Emotionally Abused: No    Physically Abused: No    Sexually Abused: No    Family History:    Family History  Problem Relation Age of Onset   Breast cancer Mother    Aneurysm Father        Likely thoracic aneurysm per patient   Hypertension Sister    Heart disease Sister  Diabetes type II Sister    Diabetes type II Sister    Diverticulitis Sister    Diabetes type II Sister    Stroke Sister    Heart disease Brother    Melanoma Brother    Melanoma Brother      ROS:  Please see the history of present illness.   All other ROS reviewed and negative.     Physical Exam/Data:   Vitals:   02/08/23 0748 02/08/23 0800 02/08/23 0825 02/08/23 0831  BP: (!) 88/49 114/63    Pulse: (!) 108 (!) 119  (!) 117  Resp: (!) 26 (!) 32  (!) 25  Temp: 97.6 F (36.4 C)     TempSrc: Oral     SpO2: 94% (!) 81% 92% 91%  Weight:      Height:        Intake/Output Summary (Last 24 hours) at 02/08/2023 0958 Last data filed at 02/08/2023 5993 Gross per 24 hour  Intake 960 ml  Output 1050 ml  Net -90 ml      02/07/2023    3:18 AM 02/06/2023    5:00 AM 02/25/2023    7:03 PM  Last 3 Weights  Weight (lbs) 123 lb 6.4 oz 121 lb 14.6 oz 119 lb 11.4 oz  Weight (kg) 55.974 kg 55.3 kg 54.3 kg     Body mass index is 22.57 kg/m.  General:  Well nourished, well developed, in no acute distress HEENT: normal Neck: no JVD Vascular: No carotid bruits; Distal pulses 2+ bilaterally Cardiac:  normal S1, S2; Irreg Irreg, tachy; no murmur  Lungs:  diffusely diminished  Abd: soft, nontender, no hepatomegaly  Ext: no edema Musculoskeletal:  No deformities, BUE and BLE strength normal and equal Skin: warm and dry  Neuro:  CNs 2-12 intact, no focal abnormalities noted Psych:  Normal affect   EKG:  The EKG was personally reviewed and demonstrates:  Afib 80 bpm, septal q waves, nonspecific T wave changes Telemetry:  Telemetry was personally reviewed and demonstrates:  Afib  100-120bpm  Relevant CV Studies:  Echo 11/12/22  1. The aortic valve is calcified. There is moderate calcification of the  aortic valve. There is moderate thickening of the aortic valve. Aortic  valve regurgitation is mild. Moderate aortic valve stenosis. Aortic valve  area, by VTI measures 1.37 cm.  Aortic valve mean gradient measures 22.8 mmHg. Aortic valve Vmax measures  3.06 m/s.   2. Left ventricular ejection fraction, by estimation, is 70 to 75%. The  left ventricle has hyperdynamic function. The left ventricle has no  regional wall motion abnormalities. There is moderate left ventricular  hypertrophy. Left ventricular diastolic  parameters are indeterminate.   3. Right ventricular systolic function is normal. The right ventricular  size is normal. There is moderately elevated pulmonary artery systolic  pressure. The estimated right ventricular systolic pressure is 57.0 mmHg.   4. The mitral valve is degenerative. Mild mitral valve regurgitation.  Mild mitral stenosis. The mean mitral valve gradient is 6.0 mmHg.   5. The inferior vena cava is normal in size with greater than 50%  respiratory variability, suggesting right atrial pressure of 3 mmHg.   Echo PVC 12/2021 Echocardiogram 01/15/2022: Left ventricle cavity is normal in size. Moderate basal septal hypertrophy, septum measuring 1.7 cm. This is more prominent than previously noted in 02/2020.  Normal LV systolic function with EF 59%. Normal global wall motion. Indeterminate diastolic filling pattern. Trileaflet aortic valve. Moderate aortic valve leaflet calcification. At least moderate  aortic stenosis. Vmax 3.1 m/sec, mean PG 27 mmHg, AVA 0.6 cm by continuity equation, dimensionless index 0.21.  Cannot exclude paradoxically low flow low gradient severe aortic stenosis. Mild to moderate aortic regurgitation. Mild to moderate mitral regurgitation. Mild to moderate tricuspid regurgitation. Estimated pulmonary  artery systolic pressure 27 mmHg. Previous study in 02/2020 noted aortic valve mean PG 15 mmHg, Vmax 2.6   m/s, AVA 0.8 cm2, dimensionless index 0.23.   Cardiac cath 2017 Coronary angiogram: Mild to moderate diffuse coronary artery disease without high-grade stenosis. Proximal LAD with a 40-50% stenosis, in some views at most 50-60% stenosed and calcified. Small circumflex, mildly diseased large right coronary artery.   Aortic valve not crossed. Difficulty in crossing. Patient has history of mild aortic stenosis by echocardiogram April 2017.   Right heart catheterization revealing mild pulmonary hypertension, PA mean pressure of 26 mmHg, wedge mean was 13 mmHg. Cardiac output 3.7, cardiac index 2.3 by Fick. There was no significant shunt, QP: QS 0.79. RA mean 3 mmHg, RA saturation 59%.; RV 34/0, EDP 3 mmHg.; PTA 36/17, mean 26 mmHg. PA saturation 49%.; Pulmonary wedge mean of 13 mmHg.   Recommendation: Patient main complaint is dyspnea, consideration for pulmonary vasodilator therapy for pulmonary hypertension may be given in view of low wedge pressure and high PA mean pressure. Patient will be discharged home with outpatient follow-up.  Laboratory Data:  High Sensitivity Troponin:   Recent Labs  Lab 02/11/2023 1316 02/14/2023 1902 02/06/23 2157  TROPONINIHS 7 8 10      Chemistry Recent Labs  Lab 01/29/2023 1316 02/04/23 0545 02/06/23 2158 02/07/23 0225  NA 129* 135  --  125*  K 4.2 3.9 3.2* 4.1  CL 93* 97*  --  87*  CO2 26 28  --  28  GLUCOSE 311* 127*  --  178*  BUN 44* 41*  --  39*  CREATININE 1.42* 1.45*  --  1.40*  CALCIUM 8.7* 8.7*  --  7.9*  MG  --   --  1.5*  --   GFRNONAA 36* 35*  --  37*  ANIONGAP 10 10  --  10    No results for input(s): "PROT", "ALBUMIN", "AST", "ALT", "ALKPHOS", "BILITOT" in the last 168 hours. Lipids No results for input(s): "CHOL", "TRIG", "HDL", "LABVLDL", "LDLCALC", "CHOLHDL" in the last 168 hours.  Hematology Recent Labs  Lab 02/20/2023 1316  02/04/23 0545  WBC 6.2 8.1  RBC 3.45* 3.38*  HGB 9.8* 9.7*  HCT 30.4* 29.9*  MCV 88.1 88.5  MCH 28.4 28.7  MCHC 32.2 32.4  RDW 16.0* 16.1*  PLT 145* 151   Thyroid No results for input(s): "TSH", "FREET4" in the last 168 hours.  BNP Recent Labs  Lab 02/17/2023 1316  BNP 335.2*    DDimer No results for input(s): "DDIMER" in the last 168 hours.   Radiology/Studies:  DG Chest Port 1 View  Result Date: 02/07/2023 CLINICAL DATA:  Hypoxia EXAM: PORTABLE CHEST 1 VIEW COMPARISON:  02/05/2023 FINDINGS: Bilateral small pleural effusions. Bilateral perihilar interstitial and alveolar airspace opacities. No pneumothorax. Stable cardiomediastinal silhouette. Heart and mediastinal contours are unremarkable. No acute osseous abnormality. 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 IMPRESSION: 1. Findings concerning for pulmonary edema. Electronically Signed   By: Kathreen Devoid M.D.   On: 02/07/2023 13:34   DG Wrist Complete Right  Result Date: 02/07/2023 CLINICAL DATA:  Right wrist pain in the scaphoid region. EXAM: RIGHT WRIST - COMPLETE 3+ VIEW COMPARISON:  Right wrist radiographs 11/22/2022 and right  wrist CT 11/23/2022 FINDINGS: Chondrocalcinosis and moderate degenerative changes at the wrist are similar to the prior studies. No acute fracture, dislocation, or destructive osseous process is identified. The bones are osteopenic. There is mild soft tissue swelling about the wrist. IMPRESSION: No acute osseous abnormality identified.  Chronic findings as above. Electronically Signed   By: Logan Bores M.D.   On: 02/07/2023 10:13   DG Chest Port 1 View  Result Date: 02/05/2023 CLINICAL DATA:  Hypoxia EXAM: PORTABLE CHEST 1 VIEW COMPARISON:  Portable exam 1022 hours compared to 02/25/2023 FINDINGS: Enlargement of cardiac silhouette with pulmonary vascular congestion. Atherosclerotic calcification aorta. Bibasilar atelectasis and pleural effusions. Scattered interstitial infiltrates  consistent with pulmonary edema. Overall pattern is similar to the previous study. No new segmental consolidation or pneumothorax. Bones demineralized with BILATERAL glenohumeral degenerative changes. IMPRESSION: Persistent pulmonary edema/CHF with bibasilar effusions and atelectasis. Aortic Atherosclerosis (ICD10-I70.0). Electronically Signed   By: Lavonia Dana M.D.   On: 02/05/2023 10:48     Assessment and Plan:   Acute on chronic hypoxic respiratory failure - multiple admissions in the last few months - respiratory failure in the setting of acute CHF - has been on IV lasix - repeat CXR 2/10 showed pulmonary edema - she is intermittently requiring BiPAP  Acute exacerbation of diastolic CHF - BNP 774. Initial CXR With pulmonary edema with small-mod pleural effusions - repeat CXR 2/10 showed pulmonary edema - fluid has been difficult to manage - she is net 3.6L - IV lasix 60mg  BID and metolazone - Toprol 12.5mg  daily - patient does not appear significantly volume up on exam. Kidney function is stable. IV lasix previously increased and Metolazone 5mg  added today. Re-check BNP. May benefit from heart failure team consult.  Permanent Afib   - HS elevated 100-120 in the setting of acute respiratory failure - on arrival EKG showed rate controlled Afib - PTA ditiazem 180mg  daily and Toprol 25mg  daily - continue Eliquis 2.5mg  BID for stroke ppx - BP intermittently soft, may be able to Q6H  CKD stage 3 - at baseline  For questions or updates, please contact [place] Please consult [email or web address] for contact info under    Signed, Foy Mungia Ninfa Meeker, PA-C  02/08/2023 9:58 AM

## 2023-02-08 NOTE — Consult Note (Signed)
Consultation Note Date: 02/08/2023   Patient Name: [name]  DOB: 03/21/1934  MRN: [id number]  Age / Sex: 89 y.o., female  PCP: [name], [place] Referring Physician: Sidney Ace, MD  Reason for Consultation: Establishing goals of care   HPI/Brief Hospital Course: 89 y.o. female  with past medical history of atrial fibrillation on Eliquis, mild pulmonary hypertension, stage IV neuroendocrine tumor favor carcinoid, CKD stage 3b at baseline, T2DM, and chronic diastolic heart failure. Admitted on 02/16/2023 from home with increased shortness of breath for the last 2-3 days. Symptoms found to be related to acute decompensated diastolic heart failure. Has received aggressive diuresis during hospital stay with difficult management of fluid status. Imaging with persistent pulmonary edema pattern.  2/11 Declining respiratory status Placed on BiPAP A. Fib with rates in 120's Increasing and addition of diuretics  Palliative medicine was consulted for assisting with goals of care conversations.  Subjective:  Extensive chart review has been completed prior to meeting patient including labs, vital signs, imaging, progress notes, orders, and available advanced directive documents from current and previous encounters.  Introduced myself as a Designer, jewellery as a member of the palliative care team. Explained palliative medicine is specialized medical care for people living with serious illness. It focuses on providing relief from the symptoms and stress of a serious illness. The goal is to improve quality of life for both the patient and the family.   Visited with [name] at her bedside. Struggling with discomfort related to BIPAP mask-attempted readjustment. Unable to fully participate in conversation due to BIPAP in place. Able to introduce her sons that were present in the room. Able to briefly explain to me her most recent medical concerns  and events that have taken place during current hospitalization.  HCPOA-[name] (son) not present in room during time of discussion. Family explains he is traveling back from [place]. Likely to visit hospital early next week. Family attempted phone call while I was present in room but unsuccessful.   Two sons present able to express their concerns regarding [name]'s health status. Aware [name] has had 3 recent hospitalizations (Nov, Dec, present) all related to increased shortness of breath likely related to HF. [name] (son) explains he and his brothers have been alternating staying with [name] in her home. [name] primarily stays with her during the week as he is retired. He explains noticing a gradual decline in her overall health and function the last several weeks. Explains [name] discharged from SNF about 3 weeks ago and on her return home is when he noticed the decline. Remained able to transfer herself to and from bathroom but required assistance with other ADL's such as bathing.  [name] and other son explain they have had conversations with [name] and she continues to express desires to keep fighting and recover.  [name] and her sons desire time to allow BIPAP and mediations to be effective at improving her respiratory status. Desire ongoing time for outcomes.  PMT will continue to follow for ongoing needs and support. Will touch base with [name] early next week with possibility of scheduling meeting to further discuss goals.  I discussed importance of continued conversations with family/support persons and all members of their medical team regarding overall plan of care and treatment options ensuring decisions are in alignment with patients goals of care.  All questions/concerns addressed. Emotional support provided to patient/family/support persons. PMT will continue to follow and support patient as needed.  Objective: Primary Diagnoses: Present on  Admission:  Acute on chronic hypoxic respiratory failure (HCC)  Stage 3b chronic kidney disease (CKD) (HCC)  Physical deconditioning  Hypothyroid  HTN (hypertension)  CAD (coronary artery disease)  Carcinoid tumor of lung  Atrial fibrillation, chronic (HCC)  Protein-calorie malnutrition, severe   Physical Exam Constitutional:      General: She is not in acute distress.    Appearance: She is ill-appearing.  Cardiovascular:     Rate and Rhythm: Tachycardia present. Rhythm irregular.  Pulmonary:     Effort: Pulmonary effort is normal.  Skin:    General: Skin is warm and dry.  Neurological:     Mental Status: She is alert.    Vital Signs: BP 116/69 (BP Location: Left Arm)   Pulse (!) 124   Temp (!) 97.5 F (36.4 C) (Oral)   Resp (!) 21   Ht 5\' 2"  (1.575 m)   Wt 56 kg   SpO2 99%   BMI 22.57 kg/m  Pain Scale: 0-10   Pain Score: 0-No pain  Palliative Assessment/Data: 60%   Assessment and Plan  SUMMARY OF RECOMMENDATIONS   DNR Remains open to all offered and available medical interventions to prolong life GOC discussion needed with HCPO/[name]-arriving early next week from out of town travel PMT to continue to follow for ongoing needs and support   Thank you for this consult and allowing Palliative Medicine to participate in the care of [name]. Palliative medicine will continue to follow and assist as needed.   Time Total: 75 minutes  Greater than 50%  of this time was spent counseling and coordinating care related to the above assessment and plan.  Signed by: Deanna Grist, DNP, AGNP-C Palliative Medicine    Please contact Palliative Medicine Team phone at [phone] for questions and concerns.  For individual provider: See [name]

## 2023-02-08 NOTE — Progress Notes (Signed)
PROGRESS NOTE    [name]  LGX:[id number] DOB: 04-17-1934 DOA: 02/08/2023 PCP: [name], FNP    Brief Narrative:  89 year old female with hypertension, hyperlipidemia, atrial fibrillation on Eliquis, CAD, history of a stroke, mild pulmonary hypertension, stage IV neuroendocrine tumor favor carcinoid, on chornic who presents emergency department for chief concerns of shortness of breath.   Presentation consistent with acute decompensated diastolic heart failure.  Fluid status has been difficult to manage.  Patient has been aggressively diuresed however repeat chest x-ray with persistent pulmonary edema pattern.  2/11: Respiratory status continues to decline.  This morning requiring 15 L high flow nasal cannula with saturations in the low to mid 80s.  Shortness of breath persistent.  Initiated on BiPAP.  Cardiology consulted.  Assessment & Plan:   Principal Problem:   Acute on chronic hypoxic respiratory failure (HCC) Active Problems:   Acute exacerbation of CHF (congestive heart failure) (HCC)   CAD (coronary artery disease)   Insulin dependent type 2 diabetes mellitus (HCC)   Stage 3b chronic kidney disease (CKD) (HCC)   Carcinoid tumor of lung   HTN (hypertension)   Hypothyroid   Physical deconditioning   Protein-calorie malnutrition, severe   Atrial fibrillation, chronic (HCC)   Hyperlipidemia   Dysuria   Protein-calorie malnutrition, moderate (HCC)  * Acute on chronic hypoxic respiratory failure (HCC) Etiology likely related to decompensated heart failure Oxygen requirements escalated from 6 L up to 15 L Plan: Transfer to PCU Start BiPAP Wean as tolerated    Acute exacerbation of diastolic CHF (congestive heart failure) ([place]) Patient has elevated BNP and clinical signs of fluid overload Complete echo done November 2023 3.6 L net negative since admission.  Diuresis has been challenging Plan: Lasix 60 mg IV twice daily Metolazone 5 mg daily Strict  ins and outs, daily weights Fluid restrict Telemetry Currently on BiPAP, wean as tolerated  Permanent atrial fibrillation Provide cardiology clinic note rate control continues to be an issue.  Patient currently on metoprolol succinate and a long-acting Cardizem.  These have been resumed with dose reduction given relative hypotension.  Maintain telemetry monitoring.    Insulin dependent type 2 diabetes mellitus (HCC) - Insulin SSI with at bedtime coverage ordered   Stage 3b chronic kidney disease (CKD) ([place]) - At baseline   Dysuria POA Urinalysis with large leukocytes.  Patient endorsing dysuria Plan: Completed 3 days of IV Rocephin No further antibiotics   Protein-calorie malnutrition, severe RD consult   Hypothyroid PTA send   HTN (hypertension) Cardizem and metoprolol as above   DVT prophylaxis: Eliquis Code Status: Full Family Communication: son [name] [phone] on 2/7, bedside 2/9 Disposition Plan: Status is: Inpatient Remains inpatient appropriate because: Decompensated heart failure on IV diuretic.  Respiratory failure requiring NIPPV   Level of care: Progressive  Consultants:  Palliative care  Procedures:  None  Antimicrobials:   Subjective: Seen and Ament.  Very short of breath.  Anxious.  Objective: Vitals:   02/08/23 0900 02/08/23 1000 02/08/23 1003 02/08/23 1010  BP: 131/80 117/69    Pulse: (!) 118 (!) 109 (!) 109 (!) 110  Resp: (!) 28 (!) 30 (!) 22 (!) 22  Temp:      TempSrc:      SpO2: 93% 97% 97% 97%  Weight:      Height:        Intake/Output Summary (Last 24 hours) at 02/08/2023 1108 Last data filed at 02/08/2023 1000 Gross per 24 hour  Intake 960 ml  Output  1050 ml  Net -90 ml   Filed Weights   02/16/2023 1903 02/06/23 0500 02/07/23 0318  Weight: 54.3 kg 55.3 kg 56 kg    Examination:  General exam: Anxious.  Respiratory distress.  Appears frail Respiratory system: Bilateral scattered crackles.  Increased work of  breathing.  15 L Cardiovascular system: S1-S2, tachycardic, irregular rhythm, no murmurs, no pedal edema Gastrointestinal system: Thin, soft, NT/ND, normal bowel sounds Central nervous system: Alert and oriented. No focal neurological deficits. Extremities: Symmetric 5 x 5 power. Skin: No rashes, lesions or ulcers Psychiatry: Judgement and insight appear normal. Mood & affect appropriate.     Data Reviewed: I have personally reviewed following labs and imaging studies  CBC: Recent Labs  Lab 02/13/2023 1316 02/04/23 0545  WBC 6.2 8.1  HGB 9.8* 9.7*  HCT 30.4* 29.9*  MCV 88.1 88.5  PLT 145* 570   Basic Metabolic Panel: Recent Labs  Lab 02/09/2023 1316 02/04/23 0545 02/06/23 2158 02/07/23 0225 02/08/23 1016  NA 129* 135  --  125* 131*  K 4.2 3.9 3.2* 4.1 3.3*  CL 93* 97*  --  87* 92*  CO2 26 28  --  28 28  GLUCOSE 311* 127*  --  178* 184*  BUN 44* 41*  --  39* 44*  CREATININE 1.42* 1.45*  --  1.40* 1.39*  CALCIUM 8.7* 8.7*  --  7.9* 8.1*  MG  --   --  1.5*  --  2.2   GFR: Estimated Creatinine Clearance: 23 mL/min (A) (by C-G formula based on SCr of 1.39 mg/dL (H)). Liver Function Tests: No results for input(s): "AST", "ALT", "ALKPHOS", "BILITOT", "PROT", "ALBUMIN" in the last 168 hours. No results for input(s): "LIPASE", "AMYLASE" in the last 168 hours. No results for input(s): "AMMONIA" in the last 168 hours. Coagulation Profile: No results for input(s): "INR", "PROTIME" in the last 168 hours. Cardiac Enzymes: No results for input(s): "CKTOTAL", "CKMB", "CKMBINDEX", "TROPONINI" in the last 168 hours. BNP (last 3 results) Recent Labs    01/12/23 1127  PROBNP 2,095*   HbA1C: No results for input(s): "HGBA1C" in the last 72 hours. CBG: Recent Labs  Lab 02/07/23 0809 02/07/23 1151 02/07/23 1622 02/07/23 2036 02/08/23 0751  GLUCAP 250* 276* 252* 203* 173*   Lipid Profile: No results for input(s): "CHOL", "HDL", "LDLCALC", "TRIG", "CHOLHDL", "LDLDIRECT" in  the last 72 hours. Thyroid Function Tests: No results for input(s): "TSH", "T4TOTAL", "FREET4", "T3FREE", "THYROIDAB" in the last 72 hours. Anemia Panel: No results for input(s): "VITAMINB12", "FOLATE", "FERRITIN", "TIBC", "IRON", "RETICCTPCT" in the last 72 hours. Sepsis Labs: Recent Labs  Lab 02/04/2023 1902  PROCALCITON 0.20    Recent Results (from the past 240 hour(s))  Resp panel by RT-PCR (RSV, Flu A&B, Covid) Anterior Nasal Swab     Status: None   Collection Time: 02/25/2023  5:58 PM   Specimen: Anterior Nasal Swab  Result Value Ref Range Status   SARS Coronavirus 2 by RT PCR NEGATIVE NEGATIVE Final    Comment: (NOTE) SARS-CoV-2 target nucleic acids are NOT DETECTED.  The SARS-CoV-2 RNA is generally detectable in upper respiratory specimens during the acute phase of infection. The lowest concentration of SARS-CoV-2 viral copies this assay can detect is 138 copies/mL. A negative result does not preclude SARS-Cov-2 infection and should not be used as the sole basis for treatment or other patient management decisions. A negative result may occur with  improper specimen collection/handling, submission of specimen other than nasopharyngeal swab, presence of viral mutation(s) within  the areas targeted by this assay, and inadequate number of viral copies(<138 copies/mL). A negative result must be combined with clinical observations, patient history, and epidemiological information. The expected result is Negative.  Fact Sheet for Patients:  EntrepreneurPulse.com.au  Fact Sheet for Healthcare Providers:  IncredibleEmployment.be  This test is no t yet approved or cleared by the [place] FDA and  has been authorized for detection and/or diagnosis of SARS-CoV-2 by FDA under an Emergency Use Authorization (EUA). This EUA will remain  in effect (meaning this test can be used) for the duration of the COVID-19 declaration under Section 564(b)(1)  of the Act, 21 U.S.C.section 360bbb-3(b)(1), unless the authorization is terminated  or revoked sooner.       Influenza A by PCR NEGATIVE NEGATIVE Final   Influenza B by PCR NEGATIVE NEGATIVE Final    Comment: (NOTE) The Xpert Xpress SARS-CoV-2/FLU/RSV plus assay is intended as an aid in the diagnosis of influenza from Nasopharyngeal swab specimens and should not be used as a sole basis for treatment. Nasal washings and aspirates are unacceptable for Xpert Xpress SARS-CoV-2/FLU/RSV testing.  Fact Sheet for Patients: EntrepreneurPulse.com.au  Fact Sheet for Healthcare Providers: IncredibleEmployment.be  This test is not yet approved or cleared by the [place] FDA and has been authorized for detection and/or diagnosis of SARS-CoV-2 by FDA under an Emergency Use Authorization (EUA). This EUA will remain in effect (meaning this test can be used) for the duration of the COVID-19 declaration under Section 564(b)(1) of the Act, 21 U.S.C. section 360bbb-3(b)(1), unless the authorization is terminated or revoked.     Resp Syncytial Virus by PCR NEGATIVE NEGATIVE Final    Comment: (NOTE) Fact Sheet for Patients: EntrepreneurPulse.com.au  Fact Sheet for Healthcare Providers: IncredibleEmployment.be  This test is not yet approved or cleared by the [place] FDA and has been authorized for detection and/or diagnosis of SARS-CoV-2 by FDA under an Emergency Use Authorization (EUA). This EUA will remain in effect (meaning this test can be used) for the duration of the COVID-19 declaration under Section 564(b)(1) of the Act, 21 U.S.C. section 360bbb-3(b)(1), unless the authorization is terminated or revoked.  Performed at [facility], [address] [place],  [address]          Radiology Studies: [facility] Chest [place] 1 View  Result Date: 02/07/2023 CLINICAL DATA:  Hypoxia EXAM: PORTABLE  CHEST 1 VIEW COMPARISON:  02/05/2023 FINDINGS: Bilateral small pleural effusions. Bilateral perihilar interstitial and alveolar airspace opacities. No pneumothorax. Stable cardiomediastinal silhouette. Heart and mediastinal contours are unremarkable. No acute osseous abnormality. 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 IMPRESSION: 1. Findings concerning for pulmonary edema. Electronically Signed   By: Kathreen Devoid M.D.   On: 02/07/2023 13:34   DG Wrist Complete Right  Result Date: 02/07/2023 CLINICAL DATA:  Right wrist pain in the scaphoid region. EXAM: RIGHT WRIST - COMPLETE 3+ VIEW COMPARISON:  Right wrist radiographs 11/22/2022 and right wrist CT 11/23/2022 FINDINGS: Chondrocalcinosis and moderate degenerative changes at the wrist are similar to the prior studies. No acute fracture, dislocation, or destructive osseous process is identified. The bones are osteopenic. There is mild soft tissue swelling about the wrist. IMPRESSION: No acute osseous abnormality identified.  Chronic findings as above. Electronically Signed   By: Logan Bores M.D.   On: 02/07/2023 10:13        Scheduled Meds:  apixaban  2.5 mg Oral BID   benzonatate  200 mg Oral TID   diltiazem  180 mg Oral  Daily   feeding supplement  237 mL Oral BID BM   fluticasone  2 spray Each Nare Daily   furosemide  60 mg Intravenous BID   gabapentin  300 mg Oral BID   insulin aspart  0-5 Units Subcutaneous QHS   insulin aspart  0-9 Units Subcutaneous TID WC   insulin glargine-yfgn  5 Units Subcutaneous Daily   levothyroxine  88 mcg Oral QAC breakfast   metolazone  5 mg Oral Once   metoprolol succinate  12.5 mg Oral Daily   multivitamin with minerals  1 tablet Oral Daily   rosuvastatin  20 mg Oral QHS   Continuous Infusions:  magnesium sulfate bolus IVPB     potassium chloride        LOS: 5 days      Sidney Ace, MD [facility]   If 7PM-7AM, please contact night-coverage  02/08/2023,  11:08 AM

## 2023-02-08 NOTE — Progress Notes (Addendum)
Placed on continuous bipap 10/5 at 70% FIO2, O2 sats 92%.

## 2023-02-08 NOTE — Progress Notes (Signed)
Patient off [place]. On 10L HFNC, O2 sats 88-100%. HR 130s-140s, Dr. Priscella Mann aware.

## 2023-02-09 ENCOUNTER — Inpatient Hospital Stay: Payer: Medicare Other

## 2023-02-09 DIAGNOSIS — J9601 Acute respiratory failure with hypoxia: Secondary | ICD-10-CM | POA: Diagnosis not present

## 2023-02-09 DIAGNOSIS — I509 Heart failure, unspecified: Secondary | ICD-10-CM | POA: Diagnosis not present

## 2023-02-09 DIAGNOSIS — E440 Moderate protein-calorie malnutrition: Secondary | ICD-10-CM | POA: Diagnosis not present

## 2023-02-09 DIAGNOSIS — J9621 Acute and chronic respiratory failure with hypoxia: Secondary | ICD-10-CM | POA: Diagnosis not present

## 2023-02-09 DIAGNOSIS — R5381 Other malaise: Secondary | ICD-10-CM | POA: Diagnosis not present

## 2023-02-09 DIAGNOSIS — I482 Chronic atrial fibrillation, unspecified: Secondary | ICD-10-CM

## 2023-02-09 LAB — CBC WITH DIFFERENTIAL/PLATELET
Abs Immature Granulocytes: 0.08 10*3/uL — ABNORMAL HIGH (ref 0.00–0.07)
Basophils Absolute: 0 10*3/uL (ref 0.0–0.1)
Basophils Relative: 0 %
Eosinophils Absolute: 0 10*3/uL (ref 0.0–0.5)
Eosinophils Relative: 0 %
HCT: 26.6 % — ABNORMAL LOW (ref 36.0–46.0)
Hemoglobin: 8.5 g/dL — ABNORMAL LOW (ref 12.0–15.0)
Immature Granulocytes: 1 %
Lymphocytes Relative: 7 %
Lymphs Abs: 0.5 10*3/uL — ABNORMAL LOW (ref 0.7–4.0)
MCH: 27.8 pg (ref 26.0–34.0)
MCHC: 32 g/dL (ref 30.0–36.0)
MCV: 86.9 fL (ref 80.0–100.0)
Monocytes Absolute: 1.3 10*3/uL — ABNORMAL HIGH (ref 0.1–1.0)
Monocytes Relative: 17 %
Neutro Abs: 5.6 10*3/uL (ref 1.7–7.7)
Neutrophils Relative %: 75 %
Platelets: 131 10*3/uL — ABNORMAL LOW (ref 150–400)
RBC: 3.06 MIL/uL — ABNORMAL LOW (ref 3.87–5.11)
RDW: 15.2 % (ref 11.5–15.5)
WBC: 7.5 10*3/uL (ref 4.0–10.5)
nRBC: 0 % (ref 0.0–0.2)

## 2023-02-09 LAB — MAGNESIUM: Magnesium: 1.9 mg/dL (ref 1.7–2.4)

## 2023-02-09 LAB — GLUCOSE, CAPILLARY
Glucose-Capillary: 198 mg/dL — ABNORMAL HIGH (ref 70–99)
Glucose-Capillary: 265 mg/dL — ABNORMAL HIGH (ref 70–99)
Glucose-Capillary: 257 mg/dL — ABNORMAL HIGH (ref 70–99)
Glucose-Capillary: 243 mg/dL — ABNORMAL HIGH (ref 70–99)

## 2023-02-09 LAB — BASIC METABOLIC PANEL
Anion gap: 11 (ref 5–15)
BUN: 50 mg/dL — ABNORMAL HIGH (ref 8–23)
CO2: 29 mmol/L (ref 22–32)
Calcium: 8.1 mg/dL — ABNORMAL LOW (ref 8.9–10.3)
Chloride: 89 mmol/L — ABNORMAL LOW (ref 98–111)
Creatinine, Ser: 1.54 mg/dL — ABNORMAL HIGH (ref 0.44–1.00)
GFR, Estimated: 33 mL/min — ABNORMAL LOW (ref >60)
Glucose, Bld: 200 mg/dL — ABNORMAL HIGH (ref 70–99)
Potassium: 3.1 mmol/L — ABNORMAL LOW (ref 3.5–5.1)
Sodium: 129 mmol/L — ABNORMAL LOW (ref 135–145)

## 2023-02-09 MED ORDER — MAGNESIUM SULFATE 2 GM/50ML IV SOLN
2.0000 g | Freq: Once | INTRAVENOUS | Status: AC
  Administered 2023-02-09: 2 g via INTRAVENOUS
  Filled 2023-02-09: qty 50

## 2023-02-09 MED ORDER — METOPROLOL SUCCINATE ER 50 MG PO TB24
50.0000 mg | ORAL_TABLET | Freq: Every day | ORAL | Status: DC
  Filled 2023-02-09: qty 1

## 2023-02-09 MED ORDER — POTASSIUM CHLORIDE CRYS ER 20 MEQ PO TBCR
40.0000 meq | EXTENDED_RELEASE_TABLET | ORAL | Status: AC
  Administered 2023-02-09 (×2): 40 meq via ORAL
  Filled 2023-02-09 (×2): qty 2

## 2023-02-09 MED ORDER — DICLOFENAC SODIUM 1 % EX GEL
4.0000 g | Freq: Four times a day (QID) | CUTANEOUS | Status: DC
  Administered 2023-02-09 – 2023-02-14 (×19): 4 g via TOPICAL
  Filled 2023-02-09 (×2): qty 100

## 2023-02-09 MED ORDER — TRAMADOL HCL 50 MG PO TABS
50.0000 mg | ORAL_TABLET | Freq: Four times a day (QID) | ORAL | Status: DC | PRN
  Administered 2023-02-09 – 2023-02-10 (×2): 50 mg via ORAL
  Filled 2023-02-09 (×2): qty 1

## 2023-02-09 NOTE — Progress Notes (Signed)
Rounding Note    Patient Name: [name] Date of Encounter: 02/09/2023  [facility] Cardiologist: Dr. Einar Gip  Subjective   Patient seen on rounds. Son remains at the bedside. Denies any chest pain or worsening shortness of breath. Continued on HFNC 10 L. -1 L output recorded in the last 24 hours.  Inpatient Medications    Scheduled Meds:  apixaban  2.5 mg Oral BID   benzonatate  200 mg Oral TID   diltiazem  180 mg Oral Daily   feeding supplement  237 mL Oral BID BM   fluticasone  2 spray Each Nare Daily   furosemide  60 mg Intravenous BID   gabapentin  300 mg Oral BID   insulin aspart  0-5 Units Subcutaneous QHS   insulin aspart  0-9 Units Subcutaneous TID WC   insulin glargine-yfgn  5 Units Subcutaneous Daily   levothyroxine  88 mcg Oral QAC breakfast   metoprolol succinate  12.5 mg Oral Daily   multivitamin with minerals  1 tablet Oral Daily   rosuvastatin  20 mg Oral QHS   Continuous Infusions:  PRN Meds: guaiFENesin-dextromethorphan, ipratropium-albuterol, metoprolol tartrate, morphine injection, polyethylene glycol, senna-docusate   Vital Signs    Vitals:   02/09/23 0727 02/09/23 0827 02/09/23 1056 02/09/23 1235  BP:  107/67 119/71 103/60  Pulse:  (!) 114 (!) 116 (!) 114  Resp:  (!) 22 20 20   Temp:  97.9 F (36.6 C) 98.2 F (36.8 C) (!) 97.4 F (36.3 C)  TempSrc:  Axillary Axillary Oral  SpO2: 93% 92% 91% 95%  Weight:      Height:        Intake/Output Summary (Last 24 hours) at 02/09/2023 1327 Last data filed at 02/09/2023 1059 Gross per 24 hour  Intake 720 ml  Output 2000 ml  Net -1280 ml      02/07/2023    3:18 AM 02/06/2023    5:00 AM 02/04/2023    7:03 PM  Last 3 Weights  Weight (lbs) 123 lb 6.4 oz 121 lb 14.6 oz 119 lb 11.4 oz  Weight (kg) 55.974 kg 55.3 kg 54.3 kg      Telemetry    Atrial fibrillation around 110bpm with spikes up to 140bpm- Personally Reviewed  ECG    No new tracings- Personally Reviewed  Physical  Exam   GEN: No acute distress.  Frail. Ill appearing  Neck: Unable to assess JVD due to positioning  Cardiac: Irregularly irregular, no murmurs, rubs, or gallops.  Respiratory: Coarse upper lobes with diminished bases bilaterally with rhonchi to auscultation bilaterally.  Respirations are unlabored at rest on 10 L of heated high flow nasal cannula GI: Soft, nontender, non-distended  MS: No edema; No deformity. Neuro:  Nonfocal  Psych: Normal affect   Labs    High Sensitivity Troponin:   Recent Labs  Lab 02/13/2023 1316 02/04/2023 1902 02/06/23 2157  TROPONINIHS 7 8 10      Chemistry Recent Labs  Lab 02/06/23 2158 02/07/23 0225 02/08/23 1016 02/09/23 0748  NA  --  125* 131* 129*  K 3.2* 4.1 3.3* 3.1*  CL  --  87* 92* 89*  CO2  --  28 28 29   GLUCOSE  --  178* 184* 200*  BUN  --  39* 44* 50*  CREATININE  --  1.40* 1.39* 1.54*  CALCIUM  --  7.9* 8.1* 8.1*  MG 1.5*  --  2.2 1.9  GFRNONAA  --  37* 37* 33*  ANIONGAP  --  10 11 11     Lipids No results for input(s): "CHOL", "TRIG", "HDL", "LABVLDL", "LDLCALC", "CHOLHDL" in the last 168 hours.  Hematology Recent Labs  Lab 02/10/2023 1316 02/04/23 0545 02/09/23 0748  WBC 6.2 8.1 7.5  RBC 3.45* 3.38* 3.06*  HGB 9.8* 9.7* 8.5*  HCT 30.4* 29.9* 26.6*  MCV 88.1 88.5 86.9  MCH 28.4 28.7 27.8  MCHC 32.2 32.4 32.0  RDW 16.0* 16.1* 15.2  PLT 145* 151 131*   Thyroid No results for input(s): "TSH", "FREET4" in the last 168 hours.  BNP Recent Labs  Lab 01/30/2023 1316 02/08/23 1016  BNP 335.2* 293.7*    DDimer  Recent Labs  Lab 02/08/23 1225  DDIMER 6.25*     Radiology    No results found.  Cardiac Studies   Echo 11/12/22  1. The aortic valve is calcified. There is moderate calcification of the  aortic valve. There is moderate thickening of the aortic valve. Aortic  valve regurgitation is mild. Moderate aortic valve stenosis. Aortic valve  area, by VTI measures 1.37 cm.  Aortic valve mean gradient measures 22.8  mmHg. Aortic valve Vmax measures  3.06 m/s.   2. Left ventricular ejection fraction, by estimation, is 70 to 75%. The  left ventricle has hyperdynamic function. The left ventricle has no  regional wall motion abnormalities. There is moderate left ventricular  hypertrophy. Left ventricular diastolic  parameters are indeterminate.   3. Right ventricular systolic function is normal. The right ventricular  size is normal. There is moderately elevated pulmonary artery systolic  pressure. The estimated right ventricular systolic pressure is 28.0 mmHg.   4. The mitral valve is degenerative. Mild mitral valve regurgitation.  Mild mitral stenosis. The mean mitral valve gradient is 6.0 mmHg.   5. The inferior vena cava is normal in size with greater than 50%  respiratory variability, suggesting right atrial pressure of 3 mmHg.    Echo PVC 12/2021 Echocardiogram 01/15/2022: Left ventricle cavity is normal in size. Moderate basal septal hypertrophy, septum measuring 1.7 cm. This is more prominent than previously noted in 02/2020.  Normal LV systolic function with EF 59%. Normal global wall motion. Indeterminate diastolic filling pattern. Trileaflet aortic valve. Moderate aortic valve leaflet calcification. At least moderate aortic stenosis. Vmax 3.1 m/sec, mean PG 27 mmHg, AVA 0.6 cm by continuity equation, dimensionless index 0.21.  Cannot exclude paradoxically low flow low gradient severe aortic stenosis. Mild to moderate aortic regurgitation. Mild to moderate mitral regurgitation. Mild to moderate tricuspid regurgitation. Estimated pulmonary artery systolic pressure 27 mmHg. Previous study in 02/2020 noted aortic valve mean PG 15 mmHg, Vmax 2.6   m/s, AVA 0.8 cm2, dimensionless index 0.23.    Cardiac cath 2017 Coronary angiogram: Mild to moderate diffuse coronary artery disease without high-grade stenosis. Proximal LAD with a 40-50% stenosis, in some views at most 50-60% stenosed and calcified.  Small circumflex, mildly diseased large right coronary artery.   Aortic valve not crossed. Difficulty in crossing. Patient has history of mild aortic stenosis by echocardiogram April 2017.   Right heart catheterization revealing mild pulmonary hypertension, PA mean pressure of 26 mmHg, wedge mean was 13 mmHg. Cardiac output 3.7, cardiac index 2.3 by Fick. There was no significant shunt, QP: QS 0.79. RA mean 3 mmHg, RA saturation 59%.; RV 34/0, EDP 3 mmHg.; PTA 36/17, mean 26 mmHg. PA saturation 49%.; Pulmonary wedge mean of 13 mmHg.   Recommendation: Patient main complaint is dyspnea, consideration for pulmonary vasodilator therapy for pulmonary hypertension may be given  in view of low wedge pressure and high PA mean pressure. Patient will be discharged home with outpatient follow-up.  Patient Profile     89 y.o. female with a past medical history of low-grade lung carcinoid, severe esophageal dysmotility, permanent atrial fibrillation, moderate diffuse CAD on cath in 2017, hypercholesterolemia, remote stroke, chronic diastolic congestive heart failure, moderate aortic stenosis, asymptomatic right subclavian artery stenosis, hypertension, type 2 diabetes, CKD stage III, sick euthyroid syndrome, she has been seen and evaluated for exacerbation of chronic diastolic congestive heart failure.  Assessment & Plan    Acute on chronic hypoxic respiratory failure -multiple admissions noted in the last few months -respiratory failure in the setting of acute CHF -has been on furosemide as outpatient -CXR 2/10 revealed pulmonary edema -may benefit from chest CT if allowed by kidney function -if pleural effusion may require thoracentesis -d-dimer 6.25, CT of the chest ordered by IM, patient respiratory status and oxygen requirements has prevented the study thus far -respiratory panel negative -palliative medicine team has been consulted and has seen the patient -on and off of BiPap as needed -titrate  FiO2 to maintain O2 sats greater than equal to 92%  Acute exacerbation of diastolic congestive heart failure -BNP 335 -CXR with pulmonary edema and small to moderate pleural effusions -fluid has been difficult to manage - -4.9L output since admission - -1L output in the last 24 hours -repeat BNP 293.7 -continued on furosemide 60 mg IV bid -continued on toprol XL 12.5 mg daily -continued on metolazone 5 mg daily, placed on hold pending CT results by IM -daily weight, I&O, low sodium diet -may benefit from advanced heart failure team'  Permanent atrial fibrillation -remains in atrial fibrillation rates 110's with a few spikes into 140's -elevated heart rates likely related to respiratory status -continued on apixaban 2.5 mg twice daily for CHADS2-VASc score of at least 9 -continued on diltiazem 180 mg daily -would avoid amiodarone with underlying lung disease -rate control is difficult secondary to hypotension which prevent the up titration on rate controlling medications -continue telemetry monitoring  Hypokalemia -serum potassium 3.1 -supplementation ordered by primary -daily bmp -monitor/trend/replete electrolytes as needed -recommend keeping potassium >4 and <5  CKD stage IIIb -serum creatinine 1.54 -serum creatinine baseline 1.3-1.4 -monitor urine output -daily bmp -monitor/trend/replete electrolytes as needed -avoid nephrotoxic agents where able  Aortic valve stenosis -moderate by echocardiogram  -not a TAVR candidate   Coronary artery disease with stable angina -severe coronary artery disease noted on CT scan -prior LHC completed 2017 with recommendations for medical management  Type II diabetes -continued on insulin -management per IM    CHA2DS2-VASc Score = 9   This indicates a 12.2% annual risk of stroke. The patient's score is based upon: CHF History: 1 HTN History: 1 Diabetes History: 1 Stroke History: 2 Vascular Disease History: 1 Age Score:  2 Gender Score: 1     For questions or updates, please contact [place] Please consult [email or web address] for contact info under        Signed, Julitza Rickles, NP  02/09/2023, 1:27 PM

## 2023-02-09 NOTE — Progress Notes (Signed)
   02/09/23 0827  Assess: MEWS Score  Temp 97.9 F (36.6 C)  BP 107/67  MAP (mmHg) 78  Pulse Rate (!) 114  Resp (!) 22  SpO2 92 %  O2 Device HFNC  O2 Flow Rate (L/min) 10 L/min  Assess: MEWS Score  MEWS Temp 0  MEWS Systolic 0  MEWS Pulse 2  MEWS RR 1  MEWS LOC 0  MEWS Score 3  MEWS Score Color Yellow  Assess: if the MEWS score is Yellow or Red  Were vital signs taken at a resting state? Yes  Focused Assessment No change from prior assessment  Does the patient meet 2 or more of the SIRS criteria? No  Does the patient have a confirmed or suspected source of infection? No  Provider and Rapid Response Notified? No  MEWS guidelines implemented  Yes, yellow  Treat  MEWS Interventions Considered administering scheduled or prn medications/treatments as ordered  Take Vital Signs  Increase Vital Sign Frequency  Yellow: Q2hr x1, continue Q4hrs until patient remains green for 12hrs  Escalate  MEWS: Escalate Yellow: Discuss with charge nurse and consider notifying provider and/or RRT  Notify: Charge Nurse/RN  Name of Charge Nurse/RN Notified Kerin Ransom, RN  Provider Notification  Provider Name/Title Dr. Priscella Mann  Date Provider Notified 02/09/23  Time Provider Notified 0845  Method of Notification Face-to-face (rounding)  Provider response Other (Comment) (Give lasix early - CT of chest)  Date of Provider Response 02/09/23  Time of Provider Response 0850  Assess: SIRS CRITERIA  SIRS Temperature  0  SIRS Pulse 1  SIRS Respirations  1  SIRS WBC 0  SIRS Score Sum  2

## 2023-02-09 NOTE — Progress Notes (Signed)
Occupational Therapy Treatment Patient Details Name: [name] MRN: [id number] DOB: 1934-07-03 Today's Date: 02/09/2023   History of present illness [name] is a 89 year old female with hypertension, hyperlipidemia, atrial fibrillation on Eliquis, CAD, history of a stroke, mild pulmonary hypertension, stage IV neuroendocrine tumor favor carcinoid, on chornic who presents emergency department for chief concerns of shortness of breath.   OT comments  Upon entering the room, pt is propped with pillows in bed and B LEs dangling to sit on EOB with support. Pt on 10Ls O2 via HFNC. Pt requesting to wash face and brush teeth. Set up A to obtain all needed items with pt's HR increasing to 150's and O2 desaturation into mid 80's with these tasks. Pt requesting to transfer to chair but concerned regarding current respiratory needs. She was able to return LEs to bed and repositioned in bed with chair position to be more upright. Pt is very pleasant and thankful for therapist assistance. Call bell and all needed items within reach.    Recommendations for follow up therapy are one component of a multi-disciplinary discharge planning process, led by the attending physician.  Recommendations may be updated based on patient status, additional functional criteria and insurance authorization.    Follow Up Recommendations  Skilled nursing-short term rehab (<3 hours/day)     Assistance Recommended at Discharge Intermittent Supervision/Assistance  Patient can return home with the following  A little help with walking and/or transfers;A little help with bathing/dressing/bathroom;Help with stairs or ramp for entrance;Assist for transportation;Assistance with cooking/housework   Equipment Recommendations  Other (comment) (defer to next venue of care)       Precautions / Restrictions Precautions Precautions: Fall Restrictions Weight Bearing Restrictions: No       Mobility Bed Mobility Overal  bed mobility: Needs Assistance Bed Mobility: Sit to Supine       Sit to supine: Supervision   General bed mobility comments: increased time to complete task    Transfers                   General transfer comment: not attempted secondary to respiratory concerns         ADL either performed or assessed with clinical judgement   ADL Overall ADL's : Needs assistance/impaired     Grooming: Wash/dry hands;Wash/dry face;Set up;Supervision/safety;Sitting;Oral care                                      Extremity/Trunk Assessment Upper Extremity Assessment Upper Extremity Assessment: Generalized weakness   Lower Extremity Assessment Lower Extremity Assessment: Generalized weakness        Vision Patient Visual Report: No change from baseline            Cognition Arousal/Alertness: Awake/alert Behavior During Therapy: WFL for tasks assessed/performed Overall Cognitive Status: Within Functional Limits for tasks assessed                                                     Pertinent Vitals/ Pain       Pain Assessment Pain Assessment: No/denies pain         Frequency  Min 2X/week        Progress Toward Goals  OT Goals(current goals can now be found in  the care plan section)  Progress towards OT goals: Progressing toward goals  Acute Rehab OT Goals Patient Stated Goal: to get better OT Goal Formulation: With patient Time For Goal Achievement: 02/18/23 Potential to Achieve Goals: Good  Plan Frequency remains appropriate;Discharge plan remains appropriate       AM-PAC OT "6 Clicks" Daily Activity     Outcome Measure   Help from another person eating meals?: None Help from another person taking care of personal grooming?: None Help from another person toileting, which includes using toliet, bedpan, or urinal?: A Little Help from another person bathing (including washing, rinsing, drying)?: A Little Help from  another person to put on and taking off regular upper body clothing?: None Help from another person to put on and taking off regular lower body clothing?: A Little 6 Click Score: 21    End of Session    OT Visit Diagnosis: Unsteadiness on feet (R26.81);Muscle weakness (generalized) (M62.81);History of falling (Z91.81)   Activity Tolerance Other (comment);Treatment limited secondary to medical complications (Comment) (HR and O2)   Patient Left in bed;with call bell/phone within reach;with bed alarm set   Nurse Communication Mobility status        Time: 3888-7579 OT Time Calculation (min): 14 min  Charges: OT General Charges $OT Visit: 1 Visit OT Treatments $Self Care/Home Management : 8-22 mins  Darleen Crocker, MS, OTR/L , CBIS ascom [phone]  02/09/23, 1:28 PM

## 2023-02-09 NOTE — TOC Initial Note (Signed)
Transition of Care [facility]) - Initial/Assessment Note    Patient Details  Name: [name] MRN: [id number] Date of Birth: [birth year]  [facility]) CM/SW Contact:    Tiburcio Bash, LCSW Phone Number: 02/09/2023, 9:42 AM  Clinical Narrative:                  CSW notes patient currently on HFNC 10L, hx of [facility] 3 weeks ago prior to being discharged back home and then admitted 2/6 with acute chronic hypoxic respiratory failure.   CSW notes palliative involved with continued [place] discussions with sons.   CSW notes recommended SNF level of care from PT/OT recommendations. Patient will need to wean to 6L or below of oxygen to qualify for SNF level of care.   Pending medical improvement and continued GOC discussion with palliative.     Expected Discharge Plan:  (TBD) Barriers to Discharge: Continued Medical Work up   Patient Goals and CMS Choice   CMS Medicare.gov Compare Post Acute Care list provided to:: Patient Choice offered to / list presented to : Patient      Expected Discharge Plan and Services                                              Prior Living Arrangements/Services                       Activities of Daily Living Home Assistive Devices/Equipment: Cane (specify quad or straight), Walker (specify type), Eyeglasses, Dentures (specify type) ADL Screening (condition at time of admission) Patient's cognitive ability adequate to safely complete daily activities?: Yes Is the patient deaf or have difficulty hearing?: No Does the patient have difficulty seeing, even when wearing glasses/contacts?: No Does the patient have difficulty concentrating, remembering, or making decisions?: No Patient able to express need for assistance with ADLs?: Yes Does the patient have difficulty dressing or bathing?: No Independently performs ADLs?: Yes (appropriate for developmental age) Does the patient have difficulty walking or climbing  stairs?: No Weakness of Legs: None Weakness of Arms/Hands: None  Permission Sought/Granted                  Emotional Assessment              Admission diagnosis:  Acute pulmonary edema ([place]) [J81.0] Acute respiratory failure with hypoxia ([place]) [J96.01] Acute on chronic congestive heart failure, unspecified heart failure type ([place]) [I50.9] Acute on chronic hypoxic respiratory failure ([place]) [J96.21] Patient Active Problem List   Diagnosis Date Noted   Protein-calorie malnutrition, moderate ([place]) 02/06/2023   Acute on chronic hypoxic respiratory failure ([place]) 02/06/2023   Hyperlipidemia 02/10/2023   Acute exacerbation of CHF (congestive heart failure) ([place]) 02/02/2023   Dysuria 02/01/2023   Malnutrition of moderate degree 12/09/2022   Acute combined systolic and diastolic congestive heart failure ([place]) 12/09/2022   Palliative care encounter 12/04/2022   Normocytic anemia 12/04/2022   History of stroke 12/04/2022   Atrial fibrillation, chronic ([place]) 12/04/2022   HAP (hospital-acquired pneumonia) 12/03/2022   Anxiety 12/03/2022   Malignant neoplasm of lung ([place]) 12/03/2022   Acute hypoxemic respiratory failure ([place]) 12/03/2022   Acute on chronic respiratory failure with hypoxemia ([place]) 12/02/2022   Acute respiratory failure with hypoxia ([place]) 12/01/2022   Stage 3b chronic kidney disease (CKD) ([place]) 12/01/2022   Protein-calorie malnutrition,  severe 11/18/2022   Hypoxia 11/12/2022   Chronic atrial fibrillation with rapid ventricular response ([place]) 08/16/2018   Chest pain 07/08/2017   ARF (acute renal failure) ([place]) 07/08/2017   Acute diverticulitis 07/08/2017   Head injury    Right hip pain 01/03/2017   Muscle strain 01/03/2017   Atrial fibrillation with rapid ventricular response ([place]) 12/12/2016   Atrial fibrillation with RVR ([place])    Diabetes mellitus with complication (HCC)    Faintness    Thrombocytopenia (HCC) 04/07/2016   Anemia 04/07/2016   SOB (shortness  of breath) on exertion    Syncope and collapse    Syncope 04/02/2016   Physical deconditioning 04/20/2015   Dyspnea 04/17/2015   SOB (shortness of breath) 04/17/2015   Acute renal failure superimposed on stage 3 chronic kidney disease ([place]) 04/17/2015   Hypokalemia 04/17/2015   Atrial fibrillation (HCC) CHA2DS2-VASc Score 7 03/13/2012   HTN (hypertension) 03/13/2012   Hypothyroid 03/13/2012   Insulin dependent type 2 diabetes mellitus ([place]) 03/13/2012   CAD (coronary artery disease) 03/13/2012   Carcinoid tumor of lung    Stroke [facility])    PCP:  [name], FNP Pharmacy:   [facility] [address] [address] Phone: [phone] Fax: [phone]     Social Determinants of Health (SDOH) Social History: SDOH Screenings   Food Insecurity: No Food Insecurity (02/04/2023)  Housing: Low Risk  (02/04/2023)  Transportation Needs: No Transportation Needs (02/04/2023)  Utilities: Not At Risk (02/04/2023)  Tobacco Use: Low Risk  (02/04/2023)   SDOH Interventions:     Readmission Risk Interventions    12/05/2022    2:46 PM  Readmission Risk Prevention Plan  PCP or Specialist appointment within 3-5 days of discharge Complete  SW Recovery Care/Counseling Consult Complete  Palliative Care Screening Complete  Skilled Nursing Facility Complete

## 2023-02-09 NOTE — Progress Notes (Addendum)
Palliative Care Progress Note, Assessment & Plan   Patient Name: [name]       Date: 02/09/2023 DOB: 05-24-1934  Age: 89 y.o. MRN#: [id number] Attending Physician: Sidney Ace, MD Primary Care Physician: [name], FNP Admit Date: 02/25/2023  Subjective: Patient is sitting up in bed with high flow nasal cannula in place.  She acknowledges my presence and is able to make her wishes known.  She is elderly and frail and pale in color.  During our discussion, her son [name] joined our discussion in person.   HPI: 89 y.o. female  with past medical history of atrial fibrillation on Eliquis, mild pulmonary hypertension, stage IV neuroendocrine tumor favor carcinoid, CKD stage 3b at baseline, T2DM, and chronic diastolic heart failure. Admitted on 01/31/2023 from home with increased shortness of breath for the last 2-3 days. Symptoms found to be related to acute decompensated diastolic heart failure. Has received aggressive diuresis during hospital stay with difficult management of fluid status. Imaging with persistent pulmonary edema pattern.   2/11 Declining respiratory status Placed on BiPAP A. Fib with rates in 120's Increasing and addition of diuretics   Palliative medicine was consulted for assisting with goals of care conversations.  Summary of counseling/coordination of care: After reviewing the patient's chart and assessing the patient at bedside, I spoke with the patient in regards to symptom management, goals of care, and treatment plan.  Patient complains of pain in her right wrist.  As per chart review, patient's right wrist is a chronic issue as she had same complaint during hospitalization in November 2023.  At that time, x-ray and CT revealed no fractures or acute issues and  uric acid was WNL.  Pain assessment completed.  Discussed with attending Dr. Priscella Mann.  I am in agreement to give diclofenac topical for pain relief. I also recommend Tramadol PRN and Uric acid to be drawn in AM to r/o gout.   I attempted to elicit values and goals important to the patient.  Patient shares she would like to be able to put her feet on the ground, move to the chair, and move around a little bit.  She shares she is an active person and does not like being in bed all day.  We discussed that her respiratory status is delicate at this time.  Attempts made for patient to move have resulted in oxygen levels dropping.  Patient shares she remembers trying to move out of bed this morning and was unable to due to her low oxygen readings.  She remains hopeful that she will be able to maintain her oxygen saturations while also moving.  We reviewed that PT/OT will continue to follow and support her mobility goals.  Nutrition status discussed.  Encouraged food for fuel and not fun.  Patient shares she feels as if she is going to choke on her food.  Reviewed that SLP's recommendations remain for pured diet.  She shares she does not have a large appetite but will eat what she can when she feels it is safe.  Short-term and long-term goals discussed with patient.  In the short term, patient hopes to be able to return home with independence as she  was PTA.  Patient's son shares he is also hopeful that patient will be able to wean off of oxygen.    We discussed that patient is requiring high flow nasal cannula in amounts greater than that being able to be supplied at home.  We discussed that patient has had poor functional status right now due to decompensated respiratory status.  I highlighted the potential that if patient is unable to wean off of high flow and/or unable to improve functional status, patient would likely need skilled nursing care, such as LTAC.  Patient deferred giving her thoughts/opinions  of an LTAC at this time.  Patient son shares he is in agreement to discuss these as viable if needed.    Both patient and son are hopeful that the next few days will prove that patient can wean off of oxygen.   Ongoing discussions needed. Time for outcomes.  Watchful waiting.  Continue to treat the treatable with DNR in place.  PMT will continue to follow.  Physical Exam Vitals reviewed.  Constitutional:      General: She is not in acute distress.    Appearance: She is normal weight. She is not ill-appearing.  HENT:     Head: Normocephalic.  Cardiovascular:     Rate and Rhythm: Tachycardia present. Rhythm irregular.  Pulmonary:     Breath sounds: Examination of the right-lower field reveals decreased breath sounds. Examination of the left-lower field reveals decreased breath sounds. Decreased breath sounds present.     Comments: Dyspnea with conversation Musculoskeletal:     Comments: Generalized weakness Right wrist - warm, sensitive/painful to touch, limited ROM, pt unable to close fist, nonradiating pain   Skin:    General: Skin is warm.  Neurological:     Mental Status: She is alert and oriented to person, place, and time.  Psychiatric:        Mood and Affect: Mood is not anxious.        Behavior: Behavior normal. Behavior is not agitated.             Total Time 50 minutes   Velita Quirk L. Ilsa Iha, FNP-BC Palliative Medicine Team Team Phone # [phone]

## 2023-02-09 NOTE — Progress Notes (Signed)
PROGRESS NOTE    [name]  PXT:[id number] DOB: Jun 12, 1934 DOA: 01/30/2023 PCP: [name], FNP    Brief Narrative:  89 year old female with hypertension, hyperlipidemia, atrial fibrillation on Eliquis, CAD, history of a stroke, mild pulmonary hypertension, stage IV neuroendocrine tumor favor carcinoid, on chornic who presents emergency department for chief concerns of shortness of breath.   Presentation consistent with acute decompensated diastolic heart failure.  Fluid status has been difficult to manage.  Patient has been aggressively diuresed however repeat chest x-ray with persistent pulmonary edema pattern.  2/11: Respiratory status continues to decline.  This morning requiring 15 L high flow nasal cannula with saturations in the low to mid 80s.  Shortness of breath persistent.  Initiated on BiPAP.  Cardiology consulted.  2/12: Shortness of breath persistent.  Patient currently on 10 L high flow nasal cannula.  Required BiPAP for several hours yesterday and then again overnight.  Volume status difficult to ascertain.  Attempting to require a chest CT without contrast.  If pleural effusions patient may require thoracentesis.  Palliative care consult appreciated.  [name]  Assessment & Plan:   Principal Problem:   Acute on chronic hypoxic respiratory failure (HCC) Active Problems:   Acute exacerbation of CHF (congestive heart failure) (HCC)   Acute respiratory failure with hypoxia (HCC)   CAD (coronary artery disease)   Insulin dependent type 2 diabetes mellitus (HCC)   Stage 3b chronic kidney disease (CKD) (HCC)   Carcinoid tumor of lung   HTN (hypertension)   Hypothyroid   Physical deconditioning   Protein-calorie malnutrition, severe   Atrial fibrillation, chronic (HCC)   Hyperlipidemia   Dysuria   Protein-calorie malnutrition, moderate (HCC)  * Acute on chronic hypoxic respiratory  failure (HCC) Etiology likely related to decompensated heart failure Oxygen requirements escalated from 6 L up to 15 L Required BiPAP, now weaned to 10 L as of 2/12 Plan: CT chest without contrast Continue oxygen, wean as tolerated NIPPV as needed Guarded prognosis    Acute exacerbation of diastolic CHF (congestive heart failure) ([place]) Patient has elevated BNP and clinical signs of fluid overload Complete echo done November 2023 3.6 L net negative since admission.  Diuresis has been challenging Plan: Lasix 60 mg IV twice daily Hold metolazone for now pending CT chest results Strict ins and outs, daily weights Fluid restrict Telemetry   Permanent atrial fibrillation Provide cardiology clinic note rate control continues to be an issue.  Patient currently on metoprolol succinate and a long-acting Cardizem.  These have been resumed with dose reduction given relative hypotension.  Maintain telemetry monitoring.    Insulin dependent type 2 diabetes mellitus (HCC) - Insulin SSI with at bedtime coverage ordered   Stage 3b chronic kidney disease (CKD) ([place]) - At baseline   Dysuria POA Urinalysis with large leukocytes.  Patient endorsing dysuria Plan: Completed 3 days of IV Rocephin No further antibiotics   Protein-calorie malnutrition, severe RD consult Palliative care consult   Hypothyroid PTA send   HTN (hypertension) Cardizem and metoprolol as above   DVT prophylaxis: Eliquis Code Status: Full Family Communication: son [name] [phone] on 2/7, bedside 2/9, via phone 2/11 Disposition Plan: Status is: Inpatient Remains inpatient appropriate because: Decompensated heart failure on IV diuretic.  Respiratory failure requiring NIPPV   Level of care: Progressive  Consultants:  Palliative care  Procedures:  None  Antimicrobials:   Subjective: Seen and examined.  Remains short of  breath.  Mentating clearly  Objective: Vitals:   02/09/23 0500 02/09/23 0600  02/09/23 0727 02/09/23 0827  BP: 100/68 (!) 103/58  107/67  Pulse: (!) 116 (!) 114  (!) 114  Resp:  20  (!) 22  Temp: 98.2 F (36.8 C) 98.1 F (36.7 C)  97.9 F (36.6 C)  TempSrc: Oral Oral  Axillary  SpO2: 95% 92% 93% 92%  Weight:      Height:        Intake/Output Summary (Last 24 hours) at 02/09/2023 1008 Last data filed at 02/09/2023 0600 Gross per 24 hour  Intake 720 ml  Output 1750 ml  Net -1030 ml   Filed Weights   02/11/2023 1903 02/06/23 0500 02/07/23 0318  Weight: 54.3 kg 55.3 kg 56 kg    Examination:  General exam: Appears short of breath.  Frail-appearing Respiratory system: Bibasilar coarse crackles.  Normal work of breathing.  10 L Cardiovascular system: S1-S2, tachycardic, irregular rhythm, no murmurs, no pedal edema Gastrointestinal system: Thin, soft, NT/ND, normal bowel sounds Central nervous system: Alert and oriented. No focal neurological deficits. Extremities: Symmetric 5 x 5 power. Skin: No rashes, lesions or ulcers Psychiatry: Judgement and insight appear normal. Mood & affect appropriate.     Data Reviewed: I have personally reviewed following labs and imaging studies  CBC: Recent Labs  Lab 02/16/2023 1316 02/04/23 0545 02/09/23 0748  WBC 6.2 8.1 7.5  NEUTROABS  --   --  5.6  HGB 9.8* 9.7* 8.5*  HCT 30.4* 29.9* 26.6*  MCV 88.1 88.5 86.9  PLT 145* 151 751*   Basic Metabolic Panel: Recent Labs  Lab 02/02/2023 1316 02/04/23 0545 02/06/23 2158 02/07/23 0225 02/08/23 1016 02/09/23 0748  NA 129* 135  --  125* 131* 129*  K 4.2 3.9 3.2* 4.1 3.3* 3.1*  CL 93* 97*  --  87* 92* 89*  CO2 26 28  --  28 28 29   GLUCOSE 311* 127*  --  178* 184* 200*  BUN 44* 41*  --  39* 44* 50*  CREATININE 1.42* 1.45*  --  1.40* 1.39* 1.54*  CALCIUM 8.7* 8.7*  --  7.9* 8.1* 8.1*  MG  --   --  1.5*  --  2.2 1.9   GFR: Estimated Creatinine Clearance: 20.7 mL/min (A) (by C-G formula based on SCr of 1.54 mg/dL (H)). Liver Function Tests: No results for  input(s): "AST", "ALT", "ALKPHOS", "BILITOT", "PROT", "ALBUMIN" in the last 168 hours. No results for input(s): "LIPASE", "AMYLASE" in the last 168 hours. No results for input(s): "AMMONIA" in the last 168 hours. Coagulation Profile: No results for input(s): "INR", "PROTIME" in the last 168 hours. Cardiac Enzymes: No results for input(s): "CKTOTAL", "CKMB", "CKMBINDEX", "TROPONINI" in the last 168 hours. BNP (last 3 results) Recent Labs    01/12/23 1127  PROBNP 2,095*   HbA1C: No results for input(s): "HGBA1C" in the last 72 hours. CBG: Recent Labs  Lab 02/08/23 0751 02/08/23 1251 02/08/23 1719 02/08/23 2216 02/09/23 0829  GLUCAP 173* 169* 126* 220* 198*   Lipid Profile: No results for input(s): "CHOL", "HDL", "LDLCALC", "TRIG", "CHOLHDL", "LDLDIRECT" in the last 72 hours. Thyroid Function Tests: No results for input(s): "TSH", "T4TOTAL", "FREET4", "T3FREE", "THYROIDAB" in the last 72 hours. Anemia Panel: No results for input(s): "VITAMINB12", "FOLATE", "FERRITIN", "TIBC", "IRON", "RETICCTPCT" in the last 72 hours. Sepsis Labs: Recent Labs  Lab 02/05/2023 1902  PROCALCITON 0.20    Recent Results (from the past 240 hour(s))  Resp panel by RT-PCR (RSV,  Flu A&B, Covid) Anterior Nasal Swab     Status: None   Collection Time: 02/09/2023  5:58 PM   Specimen: Anterior Nasal Swab  Result Value Ref Range Status   SARS Coronavirus 2 by RT PCR NEGATIVE NEGATIVE Final    Comment: (NOTE) SARS-CoV-2 target nucleic acids are NOT DETECTED.  The SARS-CoV-2 RNA is generally detectable in upper respiratory specimens during the acute phase of infection. The lowest concentration of SARS-CoV-2 viral copies this assay can detect is 138 copies/mL. A negative result does not preclude SARS-Cov-2 infection and should not be used as the sole basis for treatment or other patient management decisions. A negative result may occur with  improper specimen collection/handling, submission of specimen  other than nasopharyngeal swab, presence of viral mutation(s) within the areas targeted by this assay, and inadequate number of viral copies(<138 copies/mL). A negative result must be combined with clinical observations, patient history, and epidemiological information. The expected result is Negative.  Fact Sheet for Patients:  EntrepreneurPulse.com.au  Fact Sheet for Healthcare Providers:  IncredibleEmployment.be  This test is no t yet approved or cleared by the [place] FDA and  has been authorized for detection and/or diagnosis of SARS-CoV-2 by FDA under an Emergency Use Authorization (EUA). This EUA will remain  in effect (meaning this test can be used) for the duration of the COVID-19 declaration under Section 564(b)(1) of the Act, 21 U.S.C.section 360bbb-3(b)(1), unless the authorization is terminated  or revoked sooner.       Influenza A by PCR NEGATIVE NEGATIVE Final   Influenza B by PCR NEGATIVE NEGATIVE Final    Comment: (NOTE) The Xpert Xpress SARS-CoV-2/FLU/RSV plus assay is intended as an aid in the diagnosis of influenza from Nasopharyngeal swab specimens and should not be used as a sole basis for treatment. Nasal washings and aspirates are unacceptable for Xpert Xpress SARS-CoV-2/FLU/RSV testing.  Fact Sheet for Patients: EntrepreneurPulse.com.au  Fact Sheet for Healthcare Providers: IncredibleEmployment.be  This test is not yet approved or cleared by the [place] FDA and has been authorized for detection and/or diagnosis of SARS-CoV-2 by FDA under an Emergency Use Authorization (EUA). This EUA will remain in effect (meaning this test can be used) for the duration of the COVID-19 declaration under Section 564(b)(1) of the Act, 21 U.S.C. section 360bbb-3(b)(1), unless the authorization is terminated or revoked.     Resp Syncytial Virus by PCR NEGATIVE NEGATIVE Final     Comment: (NOTE) Fact Sheet for Patients: EntrepreneurPulse.com.au  Fact Sheet for Healthcare Providers: IncredibleEmployment.be  This test is not yet approved or cleared by the [place] FDA and has been authorized for detection and/or diagnosis of SARS-CoV-2 by FDA under an Emergency Use Authorization (EUA). This EUA will remain in effect (meaning this test can be used) for the duration of the COVID-19 declaration under Section 564(b)(1) of the Act, 21 U.S.C. section 360bbb-3(b)(1), unless the authorization is terminated or revoked.  Performed at [facility], [address] [place], [place] [address]          Radiology Studies: [facility] Chest [place] 1 View  Result Date: 02/07/2023 CLINICAL DATA:  Hypoxia EXAM: PORTABLE CHEST 1 VIEW COMPARISON:  02/05/2023 FINDINGS: Bilateral small pleural effusions. Bilateral perihilar interstitial and alveolar airspace opacities. No pneumothorax. Stable cardiomediastinal silhouette. Heart and mediastinal contours are unremarkable. No acute osseous abnormality. 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 IMPRESSION: 1. Findings concerning for pulmonary edema. Electronically Signed   By: Kathreen Devoid M.D.   On: 02/07/2023 13:34  Scheduled Meds:  apixaban  2.5 mg Oral BID   benzonatate  200 mg Oral TID   diltiazem  180 mg Oral Daily   feeding supplement  237 mL Oral BID BM   fluticasone  2 spray Each Nare Daily   furosemide  60 mg Intravenous BID   gabapentin  300 mg Oral BID   insulin aspart  0-5 Units Subcutaneous QHS   insulin aspart  0-9 Units Subcutaneous TID WC   insulin glargine-yfgn  5 Units Subcutaneous Daily   levothyroxine  88 mcg Oral QAC breakfast   metoprolol succinate  12.5 mg Oral Daily   multivitamin with minerals  1 tablet Oral Daily   potassium chloride  40 mEq Oral Q2H   rosuvastatin  20 mg Oral QHS   Continuous Infusions:  magnesium sulfate  bolus IVPB 2 g (02/09/23 0956)      LOS: 6 days      Sidney Ace, MD [facility]   If 7PM-7AM, please contact night-coverage  02/09/2023, 10:08 AM

## 2023-02-09 NOTE — Progress Notes (Signed)
PT Cancellation Note  Patient Details Name: [name] MRN: [id number] DOB: 01-28-1934   Cancelled Treatment:     Pt unable to tolerate physical activity this date without HR increasing significantly and SpO2 dropping. Will re-attempt next available date/time.   [name] 02/09/2023, 4:05 PM

## 2023-02-10 ENCOUNTER — Inpatient Hospital Stay (HOSPITAL_COMMUNITY)
Admit: 2023-02-10 | Discharge: 2023-02-10 | Disposition: A | Discharge status: Home / Self Care | Payer: Medicare Other | Attending: Cardiology | Admitting: Cardiology

## 2023-02-10 DIAGNOSIS — I509 Heart failure, unspecified: Secondary | ICD-10-CM | POA: Diagnosis not present

## 2023-02-10 DIAGNOSIS — J9601 Acute respiratory failure with hypoxia: Secondary | ICD-10-CM | POA: Diagnosis not present

## 2023-02-10 DIAGNOSIS — I4821 Permanent atrial fibrillation: Secondary | ICD-10-CM | POA: Diagnosis not present

## 2023-02-10 DIAGNOSIS — J9621 Acute and chronic respiratory failure with hypoxia: Secondary | ICD-10-CM | POA: Diagnosis not present

## 2023-02-10 DIAGNOSIS — J810 Acute pulmonary edema: Secondary | ICD-10-CM

## 2023-02-10 DIAGNOSIS — I5031 Acute diastolic (congestive) heart failure: Secondary | ICD-10-CM

## 2023-02-10 DIAGNOSIS — I5033 Acute on chronic diastolic (congestive) heart failure: Secondary | ICD-10-CM | POA: Diagnosis not present

## 2023-02-10 DIAGNOSIS — N1832 Chronic kidney disease, stage 3b: Secondary | ICD-10-CM | POA: Diagnosis not present

## 2023-02-10 LAB — BASIC METABOLIC PANEL
Anion gap: 10 (ref 5–15)
Anion gap: 10 (ref 5–15)
BUN: 65 mg/dL — ABNORMAL HIGH (ref 8–23)
BUN: 65 mg/dL — ABNORMAL HIGH (ref 8–23)
CO2: 28 mmol/L (ref 22–32)
CO2: 28 mmol/L (ref 22–32)
Calcium: 8.3 mg/dL — ABNORMAL LOW (ref 8.9–10.3)
Calcium: 8.4 mg/dL — ABNORMAL LOW (ref 8.9–10.3)
Chloride: 88 mmol/L — ABNORMAL LOW (ref 98–111)
Chloride: 88 mmol/L — ABNORMAL LOW (ref 98–111)
Creatinine, Ser: 1.89 mg/dL — ABNORMAL HIGH (ref 0.44–1.00)
Creatinine, Ser: 1.85 mg/dL — ABNORMAL HIGH (ref 0.44–1.00)
GFR, Estimated: 26 mL/min — ABNORMAL LOW (ref >60)
GFR, Estimated: 26 mL/min — ABNORMAL LOW (ref >60)
Glucose, Bld: 253 mg/dL — ABNORMAL HIGH (ref 70–99)
Glucose, Bld: 219 mg/dL — ABNORMAL HIGH (ref 70–99)
Potassium: 5 mmol/L (ref 3.5–5.1)
Potassium: 4.9 mmol/L (ref 3.5–5.1)
Sodium: 126 mmol/L — ABNORMAL LOW (ref 135–145)
Sodium: 126 mmol/L — ABNORMAL LOW (ref 135–145)

## 2023-02-10 LAB — HEPATIC FUNCTION PANEL
ALT: 15 U/L (ref 0–44)
AST: 22 U/L (ref 15–41)
Albumin: 2.7 g/dL — ABNORMAL LOW (ref 3.5–5.0)
Alkaline Phosphatase: 65 U/L (ref 38–126)
Bilirubin, Direct: 0.3 mg/dL — ABNORMAL HIGH (ref 0.0–0.2)
Indirect Bilirubin: 0.9 mg/dL (ref 0.3–0.9)
Total Bilirubin: 1.2 mg/dL (ref 0.3–1.2)
Total Protein: 6.1 g/dL — ABNORMAL LOW (ref 6.5–8.1)

## 2023-02-10 LAB — GLUCOSE, CAPILLARY
Glucose-Capillary: 254 mg/dL — ABNORMAL HIGH (ref 70–99)
Glucose-Capillary: 186 mg/dL — ABNORMAL HIGH (ref 70–99)
Glucose-Capillary: 213 mg/dL — ABNORMAL HIGH (ref 70–99)
Glucose-Capillary: 237 mg/dL — ABNORMAL HIGH (ref 70–99)

## 2023-02-10 LAB — ECHOCARDIOGRAM COMPLETE
AR max vel: 0.6 cm2
AV Area VTI: 0.59 cm2
AV Area mean vel: 0.58 cm2
AV Mean grad: 25.4 mmHg
AV Peak grad: 40 mmHg
Ao pk vel: 3.16 m/s
Area-P 1/2: 4.74 cm2
Height: 62 in
S' Lateral: 2.2 cm
Weight: 2035.29 oz

## 2023-02-10 LAB — MAGNESIUM: Magnesium: 2.6 mg/dL — ABNORMAL HIGH (ref 1.7–2.4)

## 2023-02-10 LAB — URIC ACID: Uric Acid, Serum: 10 mg/dL — ABNORMAL HIGH (ref 2.5–7.1)

## 2023-02-10 LAB — SODIUM: Sodium: 127 mmol/L — ABNORMAL LOW (ref 135–145)

## 2023-02-10 MED ORDER — INSULIN GLARGINE-YFGN 100 UNIT/ML ~~LOC~~ SOLN
10.0000 [IU] | Freq: Every day | SUBCUTANEOUS | Status: DC
  Administered 2023-02-10 – 2023-02-11 (×2): 10 [IU] via SUBCUTANEOUS
  Filled 2023-02-10 (×3): qty 0.1

## 2023-02-10 MED ORDER — METOPROLOL SUCCINATE ER 25 MG PO TB24
25.0000 mg | ORAL_TABLET | Freq: Every day | ORAL | Status: DC
  Filled 2023-02-10: qty 1

## 2023-02-10 MED ORDER — SIMETHICONE 80 MG PO CHEW
80.0000 mg | CHEWABLE_TABLET | Freq: Four times a day (QID) | ORAL | Status: DC
  Administered 2023-02-10 – 2023-02-14 (×11): 80 mg via ORAL
  Filled 2023-02-10 (×20): qty 1

## 2023-02-10 MED ORDER — FUROSEMIDE 10 MG/ML IJ SOLN
12.0000 mg/h | INTRAVENOUS | Status: DC
  Administered 2023-02-10: 6 mg/h via INTRAVENOUS
  Administered 2023-02-11 (×2): 12 mg/h via INTRAVENOUS
  Filled 2023-02-10 (×4): qty 20

## 2023-02-10 MED ORDER — TOLVAPTAN 15 MG PO TABS
15.0000 mg | ORAL_TABLET | Freq: Once | ORAL | Status: AC
  Administered 2023-02-10: 15 mg via ORAL
  Filled 2023-02-10: qty 1

## 2023-02-10 MED ORDER — PERFLUTREN LIPID MICROSPHERE
1.0000 mL | INTRAVENOUS | Status: AC | PRN
  Administered 2023-02-10: 2 mL via INTRAVENOUS

## 2023-02-10 MED ORDER — SODIUM CHLORIDE 0.9% FLUSH
3.0000 mL | Freq: Two times a day (BID) | INTRAVENOUS | Status: DC
  Administered 2023-02-10 – 2023-02-15 (×7): 3 mL via INTRAVENOUS

## 2023-02-10 NOTE — Inpatient Diabetes Management (Addendum)
Inpatient Diabetes Program Recommendations  AACE/ADA: New Consensus Statement on Inpatient Glycemic Control (2015)  Target Ranges:  Prepandial:   less than 140 mg/dL      Peak postprandial:   less than 180 mg/dL (1-2 hours)      Critically ill patients:  140 - 180 mg/dL    Latest Reference Range & Units 02/09/23 08:29 02/09/23 12:37 02/09/23 16:32 02/09/23 22:18  Glucose-Capillary 70 - 99 mg/dL 198 (H)  2 units Novolog @0944   5 units Semglee @0943  265 (H)  5 units Novolog  257 (H)  5 units Novolog  243 (H)  2 units Novolog   (H): Data is abnormally high  Latest Reference Range & Units 02/10/23 07:25  Glucose-Capillary 70 - 99 mg/dL 254 (H)  (H): Data is abnormally high      Home DM Meds: Januvia 50 mg daily     Novolog SSI   Current Orders: Semglee 5 units daily  Novolog 0-9 units TID ac/hs     MD- Note CBG 254 this AM (was 198 yest AM)  Per Chart Review, it looks like pt was using [place] Insulin 12 units QHS at home prior to admission on 12/01/2022--Not sure why basal insulin not listed on Home Med Rec  1. Please consider increasing the Semglee to 10 units Daily  2. Please also Increase the Novolog SSI to the 0-15 unit Moderate scale    --Will follow patient during hospitalization--  Wyn Quaker RN, MSN, [place] Diabetes Coordinator Inpatient Glycemic Control Team Team Pager: [phone] (8a-5p)

## 2023-02-10 NOTE — Consult Note (Addendum)
.  MEDICATION RELATED CONSULT NOTE - INITIAL   Pharmacy Consult for Tolvaptan Indication: hypervolemic hyponatremia   Allergies  Allergen Reactions   Zofran [Ondansetron] Other (See Comments)    Made me deathly sick   Codeine Nausea And Vomiting   Hydrocodone-Acetaminophen Nausea And Vomiting    Patient Measurements: Height: 5\' 2"  (157.5 cm) Weight: 57.7 kg (127 lb 3.3 oz) IBW/kg (Calculated) : 50.1  Vital Signs: Temp: 97.8 F (36.6 C) (02/13 0752) Temp Source: Oral (02/13 0400) BP: 104/64 (02/13 0752) Pulse Rate: 87 (02/13 0752) Intake/Output from previous day: 02/12 0701 - 02/13 0700 In: -  Out: 850 [Urine:850] Intake/Output from this shift: Total I/O In: -  Out: 250 [Urine:250]  Estimated Creatinine Clearance: 16.9 mL/min (A) (by C-G formula based on SCr of 1.89 mg/dL (H)).   Medical History: Past Medical History:  Diagnosis Date   Acute diverticulitis 06/2017   Anemia 06/2017   CAD (coronary artery disease) 03/13/2012   Diabetes mellitus    Dysrhythmia    ATRIAL FIB   Fall 07/07/2017   HTN (hypertension) 03/13/2012   Hypothyroid 03/13/2012   Lung cancer (HCC)    Shortness of breath    Stroke ([place])    Syncope and collapse 04/02/2016   Type II or unspecified type diabetes mellitus without mention of complication, not stated as uncontrolled 03/13/2012    Medications:  Scheduled:   apixaban  2.5 mg Oral BID   benzonatate  200 mg Oral TID   diclofenac Sodium  4 g Topical QID   feeding supplement  237 mL Oral BID BM   fluticasone  2 spray Each Nare Daily   gabapentin  300 mg Oral BID   insulin aspart  0-5 Units Subcutaneous QHS   insulin aspart  0-9 Units Subcutaneous TID WC   insulin glargine-yfgn  10 Units Subcutaneous Daily   levothyroxine  88 mcg Oral QAC breakfast   metoprolol succinate  50 mg Oral Daily   multivitamin with minerals  1 tablet Oral Daily   rosuvastatin  20 mg Oral QHS   sodium chloride flush  3 mL Intravenous Q12H   tolvaptan  15  mg Oral Once    Assessment: [name] is a 89yo female admitted with acure care wirh acute on chronic HF. PMH includes lung carcinoid, severe esophageal dysmotility, permanent atrial fibrillation, moderate diffuse CAD on cath in 2017, hypercholesterolemia, remote stroke, chronic diastolic congestive heart failure, moderate aortic stenosis, asymptomatic right subclavian artery stenosis, hypertension, type 2 diabetes, CKD stage III, sick euthyroid syndrome.  Per discussion with Dr Aundra Dubin (Adv HF) he is having problems diuresing in addition to current hyponatremia(hypervolemic hyponatremia ). Plan to order 1 dose of tolvaptan to improve Na level and re-assess fluid status.  Scr 1.89, Na 126, CrCl 16.9  Goal of Therapy:  Correction SHOULD NOT exceed 53mEq/L per 8hrs or 24mEq in 24hrs  Plan: Tolvaptan 15mg  x 1 dose on 2/13 @   Serum sodium q8h for the first 24 hours after dose administration, then daily.  Discontinue if serum sodium anytime during the first 24 hours rises >12 mEq/L from baseline.  Notify MD if Na increases by 73mEq/L per 8hrs or more.   Haillee Johann Rodriguez-Guzman PharmD, BCPS 02/10/2023 10:08 AM

## 2023-02-10 NOTE — Progress Notes (Signed)
Palliative Care Progress Note, Assessment & Plan   Patient Name: [name]       Date: 02/10/2023 DOB: Jan 01, 1934  Age: 89 y.o. MRN#: [id number] Attending Physician: Sidney Ace, MD Primary Care Physician: [name], FNP Admit Date: 02/11/2023  Subjective: Patient is sitting up in bed in no apparent distress with high flow nasal cannula in place.  She acknowledges my presence and is able to make her wishes known.  No family or friends present at bedside.  HPI: 89 y.o. female  with past medical history of atrial fibrillation on Eliquis, mild pulmonary hypertension, stage IV neuroendocrine tumor favor carcinoid, CKD stage 3b at baseline, T2DM, and chronic diastolic heart failure. Admitted on 02/02/2023 from home with increased shortness of breath for the last 2-3 days. Symptoms found to be related to acute decompensated diastolic heart failure. Has received aggressive diuresis during hospital stay with difficult management of fluid status. Imaging with persistent pulmonary edema pattern.   2/11- Declining respiratory status, Placed on BiPAP, A. Fib with rates in 120's, Increasing and addition of diuretics   2/12-SOB, 10 L high flow nasal cannula as well as BiPAP during the day and overnight.  2/13-respiratory status remains tenuous.  Heart failure team consulted and plan for right heart cath on Thursday.  Lasix gtt. started.  Palliative medicine was consulted for assisting with goals of care conversations.  Summary of counseling/coordination of care: After reviewing the patient's chart and assessing the patient at bedside, I spoke with patient in regards to plan and goals of care.  Patient shares she plans to meet with her 2 sons, daughter-in-law, and case manager this evening at 5 PM  to discuss LTAC.    We briefly discussed use of nursing skilled care as well as rehab at [facility] to help patient improve strength over a longer period of time.    I shared my concern that her respiratory status continues to decline and requires more use of bipap and higher levels of oxygen. I attempted to elicit values and goals important to the patient.  Patient shares she is "still breathing and has a chance".  She continues to agree with DNR status. We discussed boundaries of care she is willing to put in place. She is accepting of all offered, available, and appropriate medical interventions to sustain her life outside of mechanical ventilation/life support. She states she never wants to be on a machine to keep her alive.   Therapeutic silence and active listening provided for patient to share her thoughts and emotions regarding current medical situation.  Emotional support provided.  PMT will continue to follow.   Physical Exam Vitals reviewed.  Constitutional:      General: She is not in acute distress. Cardiovascular:     Rate and Rhythm: Tachycardia present.  Pulmonary:     Breath sounds: Examination of the right-lower field reveals decreased breath sounds. Examination of the left-lower field reveals decreased breath sounds. Decreased breath sounds present.  Chest:     Chest wall: No tenderness.  Abdominal:     Palpations: Abdomen is soft.  Musculoskeletal:     Comments: Generalized weakness  Neurological:     Mental Status: She is  alert and oriented to person, place, and time.  Psychiatric:        Mood and Affect: Mood is not anxious.        Behavior: Behavior normal. Behavior is not agitated.             Total Time 35 minutes   Sedric Guia L. Ilsa Iha, FNP-BC Palliative Medicine Team Team Phone # [phone]

## 2023-02-10 NOTE — Progress Notes (Signed)
PROGRESS NOTE    [name]  LMB:[id number] DOB: Jun 12, 1934 DOA: 01/30/2023 PCP: [name], FNP    Brief Narrative:  89 year old female with hypertension, hyperlipidemia, atrial fibrillation on Eliquis, CAD, history of a stroke, mild pulmonary hypertension, stage IV neuroendocrine tumor favor carcinoid, on chornic who presents emergency department for chief concerns of shortness of breath.   Presentation consistent with acute decompensated diastolic heart failure.  Fluid status has been difficult to manage.  Patient has been aggressively diuresed however repeat chest x-ray with persistent pulmonary edema pattern.  2/11: Respiratory status continues to decline.  This morning requiring 15 L high flow nasal cannula with saturations in the low to mid 80s.  Shortness of breath persistent.  Initiated on BiPAP.  Cardiology consulted.  2/12: Shortness of breath persistent.  Patient currently on 10 L high flow nasal cannula.  Required BiPAP for several hours yesterday and then again overnight.  Volume status difficult to ascertain.  Attempting to require a chest CT without contrast.  If pleural effusions patient may require thoracentesis.  Palliative care consult appreciated.  [name]  2/13: Respiratory status remains quite tenuous.  Diuresis limited by cardiorenal syndrome worsening creatinine and hyponatremia.  Heart failure service consulted.  Started on Lasix gtt.  Assessment & Plan:   Principal Problem:   Acute on chronic hypoxic respiratory failure (HCC) Active Problems:   Acute exacerbation of CHF (congestive heart failure) (HCC)   Acute respiratory failure with hypoxia (HCC)   CAD (coronary artery disease)   Insulin dependent type 2 diabetes mellitus (HCC)   Stage 3b chronic kidney disease (CKD) (HCC)   Carcinoid tumor of lung   HTN (hypertension)   Hypothyroid   Physical deconditioning    Protein-calorie malnutrition, severe   Atrial fibrillation, chronic (HCC)   Hyperlipidemia   Dysuria   Protein-calorie malnutrition, moderate (HCC)  * Acute on chronic hypoxic respiratory failure (HCC) Etiology likely related to decompensated heart failure Oxygen requirements escalated from 6 L up to 15 L Intermittently requiring BiPAP Chest CT with bilateral loculated effusions right greater than left and a significant evidence of pulmonary edema Plan: Continue BiPAP Lasix gtt., escalated to 12 mg/h per cardiology Wean oxygen as tolerated    Acute exacerbation of diastolic CHF (congestive heart failure) ([place]) Patient has elevated BNP and clinical signs of fluid overload Complete echo done November 2023 3.6 L net negative since admission.  Diuresis has been challenging Plan: Lasix gtt. as above Hold metolazone for now Cardiology ordering [place] Strict ins and outs, daily weights Fluid restrict Telemetry   Permanent atrial fibrillation Provide cardiology clinic note rate control continues to be an issue.  Patient currently on metoprolol succinate and a long-acting Cardizem.  These have been resumed with dose reduction given relative hypotension.  Maintain telemetry monitoring.    Insulin dependent type 2 diabetes mellitus (HCC) - Insulin SSI with at bedtime coverage ordered   Stage 3b chronic kidney disease (CKD) ([place]) - At baseline   Dysuria POA Urinalysis with large leukocytes.  Patient endorsing dysuria Plan: Completed 3 days of IV Rocephin No further antibiotics   Protein-calorie malnutrition, severe RD consult Palliative care consult DNR   Hypothyroid PTA send   HTN (hypertension) Cardizem and metoprolol as above   DVT prophylaxis: Eliquis Code Status: Full Family Communication: son [name] [phone] on 2/7, bedside 2/9, via phone 2/11.  Unable to reach family on 2/13 Disposition Plan: Status is: Inpatient  Remains inpatient appropriate because:  Decompensated heart failure on IV diuretic.  Respiratory failure requiring NIPPV   Level of care: Progressive  Consultants:  Palliative care  Procedures:  None  Antimicrobials:   Subjective: Seen and examined.  Remains short of breath.  Work of breathing improved while on BiPAP  Objective: Vitals:   02/10/23 0517 02/10/23 0554 02/10/23 0600 02/10/23 0752  BP:   94/63 104/64  Pulse: 100 95 88 87  Resp: 20 19 20 17   Temp:    97.8 F (36.6 C)  TempSrc:      SpO2: (!) 88% 90% 93% 97%  Weight: 57.7 kg     Height:        Intake/Output Summary (Last 24 hours) at 02/10/2023 1007 Last data filed at 02/10/2023 0908 Gross per 24 hour  Intake --  Output 1100 ml  Net -1100 ml   Filed Weights   02/06/23 0500 02/07/23 0318 02/10/23 0517  Weight: 55.3 kg 56 kg 57.7 kg    Examination:  General exam: Dyspneic.  Frail-appearing Respiratory system: Coarse crackles bilaterally.  Increased work of breathing.  BiPAP Cardiovascular system: S1-S2, tachycardic, irregular rhythm, no murmurs, no pedal edema Gastrointestinal system: Thin, soft, NT/ND, normal bowel sounds Central nervous system: Alert and oriented. No focal neurological deficits. Extremities: Symmetric 5 x 5 power. Skin: No rashes, lesions or ulcers Psychiatry: Judgement and insight appear normal. Mood & affect appropriate.     Data Reviewed: I have personally reviewed following labs and imaging studies  CBC: Recent Labs  Lab 02/18/2023 1316 02/04/23 0545 02/09/23 0748  WBC 6.2 8.1 7.5  NEUTROABS  --   --  5.6  HGB 9.8* 9.7* 8.5*  HCT 30.4* 29.9* 26.6*  MCV 88.1 88.5 86.9  PLT 145* 151 287*   Basic Metabolic Panel: Recent Labs  Lab 02/04/23 0545 02/06/23 2158 02/07/23 0225 02/08/23 1016 02/09/23 0748 02/10/23 0621  NA 135  --  125* 131* 129* 126*  K 3.9 3.2* 4.1 3.3* 3.1* 5.0  CL 97*  --  87* 92* 89* 88*  CO2 28  --  28 28 29 28   GLUCOSE 127*  --  178* 184* 200* 253*  BUN 41*  --  39* 44* 50* 65*   CREATININE 1.45*  --  1.40* 1.39* 1.54* 1.89*  CALCIUM 8.7*  --  7.9* 8.1* 8.1* 8.3*  MG  --  1.5*  --  2.2 1.9 2.6*   GFR: Estimated Creatinine Clearance: 16.9 mL/min (A) (by C-G formula based on SCr of 1.89 mg/dL (H)). Liver Function Tests: No results for input(s): "AST", "ALT", "ALKPHOS", "BILITOT", "PROT", "ALBUMIN" in the last 168 hours. No results for input(s): "LIPASE", "AMYLASE" in the last 168 hours. No results for input(s): "AMMONIA" in the last 168 hours. Coagulation Profile: No results for input(s): "INR", "PROTIME" in the last 168 hours. Cardiac Enzymes: No results for input(s): "CKTOTAL", "CKMB", "CKMBINDEX", "TROPONINI" in the last 168 hours. BNP (last 3 results) Recent Labs    01/12/23 1127  PROBNP 2,095*   HbA1C: No results for input(s): "HGBA1C" in the last 72 hours. CBG: Recent Labs  Lab 02/09/23 0829 02/09/23 1237 02/09/23 1632 02/09/23 2218 02/10/23 0725  GLUCAP 198* 265* 257* 243* 254*   Lipid Profile: No results for input(s): "CHOL", "HDL", "LDLCALC", "TRIG", "CHOLHDL", "LDLDIRECT" in the last 72 hours. Thyroid Function Tests: No results for input(s): "TSH", "T4TOTAL", "FREET4", "T3FREE", "THYROIDAB" in the last 72 hours. Anemia Panel: No results for input(s): "VITAMINB12", "FOLATE", "FERRITIN", "TIBC", "IRON", "RETICCTPCT" in  the last 72 hours. Sepsis Labs: Recent Labs  Lab 02/09/2023 1902  PROCALCITON 0.20    Recent Results (from the past 240 hour(s))  Resp panel by RT-PCR (RSV, Flu A&B, Covid) Anterior Nasal Swab     Status: None   Collection Time: 01/31/2023  5:58 PM   Specimen: Anterior Nasal Swab  Result Value Ref Range Status   SARS Coronavirus 2 by RT PCR NEGATIVE NEGATIVE Final    Comment: (NOTE) SARS-CoV-2 target nucleic acids are NOT DETECTED.  The SARS-CoV-2 RNA is generally detectable in upper respiratory specimens during the acute phase of infection. The lowest concentration of SARS-CoV-2 viral copies this assay can detect  is 138 copies/mL. A negative result does not preclude SARS-Cov-2 infection and should not be used as the sole basis for treatment or other patient management decisions. A negative result may occur with  improper specimen collection/handling, submission of specimen other than nasopharyngeal swab, presence of viral mutation(s) within the areas targeted by this assay, and inadequate number of viral copies(<138 copies/mL). A negative result must be combined with clinical observations, patient history, and epidemiological information. The expected result is Negative.  Fact Sheet for Patients:  EntrepreneurPulse.com.au  Fact Sheet for Healthcare Providers:  IncredibleEmployment.be  This test is no t yet approved or cleared by the [place] FDA and  has been authorized for detection and/or diagnosis of SARS-CoV-2 by FDA under an Emergency Use Authorization (EUA). This EUA will remain  in effect (meaning this test can be used) for the duration of the COVID-19 declaration under Section 564(b)(1) of the Act, 21 U.S.C.section 360bbb-3(b)(1), unless the authorization is terminated  or revoked sooner.       Influenza A by PCR NEGATIVE NEGATIVE Final   Influenza B by PCR NEGATIVE NEGATIVE Final    Comment: (NOTE) The Xpert Xpress SARS-CoV-2/FLU/RSV plus assay is intended as an aid in the diagnosis of influenza from Nasopharyngeal swab specimens and should not be used as a sole basis for treatment. Nasal washings and aspirates are unacceptable for Xpert Xpress SARS-CoV-2/FLU/RSV testing.  Fact Sheet for Patients: EntrepreneurPulse.com.au  Fact Sheet for Healthcare Providers: IncredibleEmployment.be  This test is not yet approved or cleared by the [place] FDA and has been authorized for detection and/or diagnosis of SARS-CoV-2 by FDA under an Emergency Use Authorization (EUA). This EUA will remain in effect  (meaning this test can be used) for the duration of the COVID-19 declaration under Section 564(b)(1) of the Act, 21 U.S.C. section 360bbb-3(b)(1), unless the authorization is terminated or revoked.     Resp Syncytial Virus by PCR NEGATIVE NEGATIVE Final    Comment: (NOTE) Fact Sheet for Patients: EntrepreneurPulse.com.au  Fact Sheet for Healthcare Providers: IncredibleEmployment.be  This test is not yet approved or cleared by the [place] FDA and has been authorized for detection and/or diagnosis of SARS-CoV-2 by FDA under an Emergency Use Authorization (EUA). This EUA will remain in effect (meaning this test can be used) for the duration of the COVID-19 declaration under Section 564(b)(1) of the Act, 21 U.S.C. section 360bbb-3(b)(1), unless the authorization is terminated or revoked.  Performed at [facility], [address], [place], [place] [address]          Radiology Studies: CT CHEST WO CONTRAST  Result Date: 02/09/2023 CLINICAL DATA:  Chronic shortness of breath. EXAM: CT CHEST WITHOUT CONTRAST TECHNIQUE: Multidetector CT imaging of the chest was performed following the standard protocol without IV contrast. RADIATION DOSE REDUCTION: This exam was performed according to  the departmental dose-optimization program which includes automated exposure control, adjustment of the mA and/or kV according to patient size and/or use of iterative reconstruction technique. COMPARISON:  11/12/2022 and 07/08/2017. FINDINGS: Cardiovascular: Atherosclerotic calcification of the aorta, aortic valve and coronary arteries. Enlarged pulmonic trunk and heart. No pericardial effusion. Mediastinum/Nodes: Mediastinal lymph nodes measure up to 15 mm in the low right paratracheal station, increased slightly from 10 mm. Hilar regions are difficult to evaluate without IV contrast. No axillary adenopathy. Esophagus is grossly unremarkable. Lungs/Pleura:  Diffuse centrilobular ground-glass and septal thickening with large, partially loculated pleural effusions. Collapse/consolidation in both lower lobes. 8 mm left upper lobe nodule (3/48), unchanged from 07/08/2017. Airway is unremarkable. Upper Abdomen: Liver margin is slightly irregular. Visualized portions of the liver, adrenal glands, spleen pancreas and stomach are otherwise grossly unremarkable. Musculoskeletal: Degenerative changes in the spine. Upper and lower thoracic compression deformities, unchanged. IMPRESSION: 1. Congestive heart failure. Pleural effusions are partially loculated. 2. Probable compressive atelectasis in both lower lobes. Difficult to exclude superimposed pneumonia. 3. Mediastinal adenopathy, likely reactive. 4. 8 mm lingular nodule, stable from 07/08/2017. Additional pulmonary nodules seen on prior studies are relatively obscured on today's study in this patient with biopsy-proven low-grade neuroendocrine tumor. Recommend continued attention on follow-up. 5. Cirrhosis. 6. Aortic atherosclerosis (ICD10-I70.0). Coronary artery calcification. 7. Enlarged pulmonic trunk, indicative of pulmonary arterial hypertension. Electronically Signed   By: Lorin Picket M.D.   On: 02/09/2023 16:42        Scheduled Meds:  apixaban  2.5 mg Oral BID   benzonatate  200 mg Oral TID   diclofenac Sodium  4 g Topical QID   feeding supplement  237 mL Oral BID BM   fluticasone  2 spray Each Nare Daily   gabapentin  300 mg Oral BID   insulin aspart  0-5 Units Subcutaneous QHS   insulin aspart  0-9 Units Subcutaneous TID WC   insulin glargine-yfgn  10 Units Subcutaneous Daily   levothyroxine  88 mcg Oral QAC breakfast   metoprolol succinate  50 mg Oral Daily   multivitamin with minerals  1 tablet Oral Daily   rosuvastatin  20 mg Oral QHS   sodium chloride flush  3 mL Intravenous Q12H   tolvaptan  15 mg Oral Once   Continuous Infusions:  furosemide (LASIX) 200 mg in dextrose 5 % 100 mL (2  mg/mL) infusion 12 mg/hr (02/10/23 0944)      LOS: 7 days      Sidney Ace, MD [facility]   If 7PM-7AM, please contact night-coverage  02/10/2023, 10:07 AM

## 2023-02-10 NOTE — Progress Notes (Signed)
*  PRELIMINARY RESULTS* Echocardiogram 2D Echocardiogram has been performed.  Sherrie Sport 02/10/2023, 11:59 AM

## 2023-02-10 NOTE — Progress Notes (Signed)
Brief hospitalist update note  Patient's respiratory status has continued to decline despite aggressive IV diuresis.  She is on Lasix gtt. at 12 mg/h.  Remains markedly hypoxic, now requiring heated high flow nasal cannula.  Chest CT reviewed.  Bilateral pleural effusions.  Right greater than left.  Plan: Will place consult for ultrasound-guided thoracentesis.  Procedure discussed with patient and patient's son at bedside.  Patient is on Eliquis.  Last dose 2/13 AM.  Will hold this medication for now pending evaluation by ultrasound for possible thora.  Ralene Muskrat MD  No charge

## 2023-02-10 NOTE — Telephone Encounter (Signed)
Still admitted

## 2023-02-10 NOTE — Consult Note (Addendum)
Advanced Heart Failure Team Consult Note   Primary Physician: [name], FNP PCP-Cardiologist:  None  Reason for Consultation: CHF  HPI:    [name] is seen today for evaluation of CHF at the request of Dr. Fletcher Anon.   89 y.o. frail patient with history of permanent atrial fibrillation, CKD stage 3b, low grade lung carcinoid tumor, moderate aortic stenosis and chronic diastolic CHF was admitted with acute on chronic respiratory failure and CHF.  Patient had similar admissions in 11/23 and 12/23 with acute on chronic respiratory failure and CHF.  She never smoked.  She was admitted on 2/6 with dyspnea and pulmonary edema on CXR.  She required 6L oxygen initially and has been on Bipap at times.  She was given IV Lasix boluses, last bolus was 60 mg IV yesterday morning.  She has not diuresed well and creatinine has been rising.    Last echo in 11/23 showed EF 70-75%, moderate AS with mean gradient 23 and AVA 1.37 cm^2, RV normal, PASP 45 mmHg.  I reviewed the echo images, and aortic stenosis does not look severe at that time.   This morning, creatinine 1.89 with Na down to 126.  She is back on Bipap this morning and has not received morning meds.  Atrial fibrillation with rate 100s currently.  She was ordered for a Lasix infusion this morning.   CT chest on 2/12 showed diffuse GGO consistent with CHF, large partially loculated pleural effusions, R>L.   PMH: 1. Atrial fibrillation: Permanent, present since 2013.  Off amiodarone due to thyroid disease.  2. Esophageal dysmotility 3. Right subclavian stenosis.  4. Type 2 diabetes 5. CKD stage 3b 6. 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  7. CAD: Cath in 2017 with 40-50% proximal LAD stenosis.  8. Chronic diastolic CHF: Echo in 79/02 with EF 70-75%, moderate AS with mean gradient 23 and AVA 1.37 cm^2, RV normal, PASP 45 mmHg.  9. Aortic stenosis: Moderate on 11/23 echo.   Review of Systems: All  systems reviewed and negative except as per HPI.   Home Medications Prior to Admission medications   Medication Sig Start Date End Date Taking? Authorizing Provider  acetaminophen (TYLENOL) 325 MG tablet Take 2 tablets (650 mg total) by mouth every 6 (six) hours as needed for mild pain, fever or headache. 11/26/22  Yes Elgergawy, Silver Huguenin, MD  apixaban (ELIQUIS) 2.5 MG TABS tablet Take 1 tablet (2.5 mg total) by mouth 2 (two) times daily. 01/20/23  Yes Adrian Prows, MD  budesonide (PULMICORT) 0.5 MG/2ML nebulizer solution Take 2 mLs (0.5 mg total) by nebulization 2 (two) times daily. 11/26/22  Yes Elgergawy, Silver Huguenin, MD  calcium carbonate (SUPER CALCIUM) 1500 (600 Ca) MG TABS tablet Take 600 mg of elemental calcium by mouth daily with breakfast.   Yes [provider]  diltiazem (DILACOR XR) 180 MG 24 hr capsule Take 180 mg by mouth daily.   Yes [provider]  furosemide (LASIX) 20 MG tablet Take 1 tablet (20 mg total) by mouth daily. 11/26/22 11/26/23 Yes Elgergawy, Silver Huguenin, MD  insulin aspart (NOVOLOG) 100 UNIT/ML injection Inject 0-9 Units into the skin 3 (three) times daily with meals. 11/26/22  Yes Elgergawy, Silver Huguenin, MD  ipratropium-albuterol (DUONEB) 0.5-2.5 (3) MG/3ML SOLN Take 3 mLs by nebulization every 6 (six) hours as needed. 11/26/22  Yes Elgergawy, Silver Huguenin, MD  levothyroxine (SYNTHROID) 88 MCG tablet Take 88 mcg by mouth daily. 07/25/22  Yes [provider]  magnesium oxide (MAG-OX) 400 (240 Mg) MG tablet Take 400 mg by mouth daily.   Yes [provider]  metoprolol succinate (TOPROL-XL) 25 MG 24 hr tablet Take 25 mg by mouth daily.   Yes [provider]  Multiple Vitamin (MULTIVITAMIN WITH MINERALS) TABS tablet Take 1 tablet by mouth daily. 11/27/22  Yes Elgergawy, Silver Huguenin, MD  polyethylene glycol (MIRALAX / GLYCOLAX) 17 g packet Take 17 g by mouth daily as needed for mild constipation. 12/09/22  Yes Richarda Osmond, MD  rosuvastatin  (CRESTOR) 20 MG tablet Take 20 mg by mouth daily.   Yes [provider]  sitaGLIPtin (JANUVIA) 50 MG tablet Take 50 mg by mouth daily.   Yes [provider]  cefUROXime (CEFTIN) 500 MG tablet Take one tablet by mouth every 12 hours for 7 days. Patient not taking: Reported on 02/04/2023 01/23/23   [provider]  Fluocinolone Acetonide 0.01 % OIL Place 5 drops in ear(s) daily. Patient not taking: Reported on 02/04/2023 07/25/22   [provider]  Insulin Pen Needle (NOVOFINE PLUS PEN NEEDLE) 32G X 4 MM MISC ONE 04/10/22   [provider]  lactose free nutrition (BOOST) LIQD Take 237 mLs by mouth daily.    [provider]  metoprolol succinate (TOPROL-XL) 100 MG 24 hr tablet Take 1 tablet (100 mg total) by mouth daily. Take with or immediately following a meal. Patient not taking: Reported on 02/04/2023 01/12/23 07/11/23  Adrian Prows, MD  Skin Protectants, Misc. (EUCERIN) cream Apply 1 Application topically 2 (two) times daily.    [provider]    Past Medical History: Past Medical History:  Diagnosis Date   Acute diverticulitis 06/2017   Anemia 06/2017   CAD (coronary artery disease) 03/13/2012   Diabetes mellitus    Dysrhythmia    ATRIAL FIB   Fall 07/07/2017   HTN (hypertension) 03/13/2012   Hypothyroid 03/13/2012   Lung cancer ([place])    Shortness of breath    Stroke ([place])    Syncope and collapse 04/02/2016   Type II or unspecified type diabetes mellitus without mention of complication, not stated as uncontrolled 03/13/2012    Past Surgical History: Past Surgical History:  Procedure Laterality Date   ABDOMINAL HYSTERECTOMY     BIOPSY  06/13/2021   Procedure: BIOPSY;  Surgeon: Ronnette Juniper, MD;  Location: [facility];  Service: Gastroenterology;;   BREAST EXCISIONAL BIOPSY Left    BREAST SURGERY     benign tumors removed in [place]  04/20/2012   Procedure: CARDIOVERSION;  Surgeon:  Laverda Page, MD;  Location: [place];  Service: Cardiovascular;  Laterality: N/A;   CARDIOVERSION N/A 05/10/2013   Procedure: CARDIOVERSION;  Surgeon: Laverda Page, MD;  Location: [place];  Service: Cardiovascular;  Laterality: N/A;   CHOLECYSTECTOMY     COLONOSCOPY WITH PROPOFOL N/A 06/13/2021   Procedure: COLONOSCOPY WITH PROPOFOL;  Surgeon: Ronnette Juniper, MD;  Location: [facility];  Service: Gastroenterology;  Laterality: N/A;   ESOPHAGOGASTRODUODENOSCOPY (EGD) WITH PROPOFOL N/A 06/13/2021   Procedure: ESOPHAGOGASTRODUODENOSCOPY (EGD) WITH PROPOFOL;  Surgeon: Ronnette Juniper, MD;  Location: [facility];  Service: Gastroenterology;  Laterality: N/A;   HEMORRHOID SURGERY     jaw replacement     LEFT HEART CATHETERIZATION WITH CORONARY ANGIOGRAM N/A 04/20/2015   Procedure: LEFT HEART CATHETERIZATION WITH CORONARY ANGIOGRAM;  Surgeon: Adrian Prows, MD;  Location: [facility] C[facility];  Service: Cardiovascular;  Laterality:  N/A;   tumors     back of head    Family History: Family History  Problem Relation Age of Onset   Breast cancer Mother    Aneurysm Father        Likely thoracic aneurysm per patient   Hypertension Sister    Heart disease Sister    Diabetes type II Sister    Diabetes type II Sister    Diverticulitis Sister    Diabetes type II Sister    Stroke Sister    Heart disease Brother    Melanoma Brother    Melanoma Brother     Social History: Social History   Socioeconomic History   Marital status: Widowed    Spouse name: Not on file   Number of children: 3   Years of education: Not on file   Highest education level: Not on file  Occupational History   Not on file  Tobacco Use   Smoking status: Never   Smokeless tobacco: Never  Vaping Use   Vaping Use: Never used  Substance and Sexual Activity   Alcohol use: No   Drug use: No   Sexual activity: Yes    Birth control/protection: None  Other Topics Concern   Not on file  Social History Narrative   Not on file    Social Determinants of Health   Financial Resource Strain: Not on file  Food Insecurity: No Food Insecurity (02/04/2023)   Hunger Vital Sign    Worried About Running Out of Food in the Last Year: Never true    Ran Out of Food in the Last Year: Never true  Transportation Needs: No Transportation Needs (02/04/2023)   PRAPARE - Hydrologist (Medical): No    Lack of Transportation (Non-Medical): No  Physical Activity: Not on file  Stress: Not on file  Social Connections: Not on file    Allergies:  Allergies  Allergen Reactions   Zofran [Ondansetron] Other (See Comments)    Made me deathly sick   Codeine Nausea And Vomiting   Hydrocodone-Acetaminophen Nausea And Vomiting    Objective:    Vital Signs:   Temp:  [97.4 F (36.3 C)-98.3 F (36.8 C)] 97.8 F (36.6 C) (02/13 0752) Pulse Rate:  [87-133] 87 (02/13 0752) Resp:  [9-33] 17 (02/13 0752) BP: (89-119)/(52-72) 104/64 (02/13 0752) SpO2:  [83 %-98 %] 97 % (02/13 0752) FiO2 (%):  [80 %] 80 % (02/13 0554) Weight:  [57.7 kg] 57.7 kg (02/13 0517) Last BM Date : 02/07/23  Weight change: Filed Weights   02/06/23 0500 02/07/23 0318 02/10/23 0517  Weight: 55.3 kg 56 kg 57.7 kg    Intake/Output:   Intake/Output Summary (Last 24 hours) at 02/10/2023 0945 Last data filed at 02/10/2023 0908 Gross per 24 hour  Intake --  Output 1100 ml  Net -1100 ml      Physical Exam    General:  Frail, on Bipap HEENT: normal Neck: supple. JVP 10 cm. Carotids 2+ bilat; no bruits. No lymphadenopathy or thyromegaly appreciated. Cor: PMI nondisplaced. Regular rate & rhythm. No rubs, gallops or murmurs. Lungs: Crackles at bases.  Abdomen: soft, nontender, nondistended. No hepatosplenomegaly. No bruits or masses. Good bowel sounds. Extremities: no cyanosis, clubbing, rash, edema Neuro: alert & orientedx3, cranial nerves grossly intact. moves all 4 extremities w/o difficulty. Affect pleasant   Telemetry    Atrial fibrillation 100s (personally reviewed).   EKG    Atrial fibrillation with septal Qs (personally reviewed)  Labs  Basic Metabolic Panel: Recent Labs  Lab 02/04/23 0545 02/06/23 2158 02/07/23 0225 02/08/23 1016 02/09/23 0748 02/10/23 0621  NA 135  --  125* 131* 129* 126*  K 3.9 3.2* 4.1 3.3* 3.1* 5.0  CL 97*  --  87* 92* 89* 88*  CO2 28  --  28 28 29 28   GLUCOSE 127*  --  178* 184* 200* 253*  BUN 41*  --  39* 44* 50* 65*  CREATININE 1.45*  --  1.40* 1.39* 1.54* 1.89*  CALCIUM 8.7*  --  7.9* 8.1* 8.1* 8.3*  MG  --  1.5*  --  2.2 1.9 2.6*    Liver Function Tests: No results for input(s): "AST", "ALT", "ALKPHOS", "BILITOT", "PROT", "ALBUMIN" in the last 168 hours. No results for input(s): "LIPASE", "AMYLASE" in the last 168 hours. No results for input(s): "AMMONIA" in the last 168 hours.  CBC: Recent Labs  Lab 02/11/2023 1316 02/04/23 0545 02/09/23 0748  WBC 6.2 8.1 7.5  NEUTROABS  --   --  5.6  HGB 9.8* 9.7* 8.5*  HCT 30.4* 29.9* 26.6*  MCV 88.1 88.5 86.9  PLT 145* 151 131*    Cardiac Enzymes: No results for input(s): "CKTOTAL", "CKMB", "CKMBINDEX", "TROPONINI" in the last 168 hours.  BNP: BNP (last 3 results) Recent Labs    12/01/22 1503 02/04/2023 1316 02/08/23 1016  BNP 228.0* 335.2* 293.7*    ProBNP (last 3 results) Recent Labs    01/12/23 1127  PROBNP 2,095*     CBG: Recent Labs  Lab 02/09/23 0829 02/09/23 1237 02/09/23 1632 02/09/23 2218 02/10/23 0725  GLUCAP 198* 265* 257* 243* 254*    Coagulation Studies: No results for input(s): "LABPROT", "INR" in the last 72 hours.   Imaging   CT CHEST WO CONTRAST  Result Date: 02/09/2023 CLINICAL DATA:  Chronic shortness of breath. EXAM: CT CHEST WITHOUT CONTRAST TECHNIQUE: Multidetector CT imaging of the chest was performed following the standard protocol without IV contrast. RADIATION DOSE REDUCTION: This exam was performed according to the departmental dose-optimization program  which includes automated exposure control, adjustment of the mA and/or kV according to patient size and/or use of iterative reconstruction technique. COMPARISON:  11/12/2022 and 07/08/2017. FINDINGS: Cardiovascular: Atherosclerotic calcification of the aorta, aortic valve and coronary arteries. Enlarged pulmonic trunk and heart. No pericardial effusion. Mediastinum/Nodes: Mediastinal lymph nodes measure up to 15 mm in the low right paratracheal station, increased slightly from 10 mm. Hilar regions are difficult to evaluate without IV contrast. No axillary adenopathy. Esophagus is grossly unremarkable. Lungs/Pleura: Diffuse centrilobular ground-glass and septal thickening with large, partially loculated pleural effusions. Collapse/consolidation in both lower lobes. 8 mm left upper lobe nodule (3/48), unchanged from 07/08/2017. Airway is unremarkable. Upper Abdomen: Liver margin is slightly irregular. Visualized portions of the liver, adrenal glands, spleen pancreas and stomach are otherwise grossly unremarkable. Musculoskeletal: Degenerative changes in the spine. Upper and lower thoracic compression deformities, unchanged. IMPRESSION: 1. Congestive heart failure. Pleural effusions are partially loculated. 2. Probable compressive atelectasis in both lower lobes. Difficult to exclude superimposed pneumonia. 3. Mediastinal adenopathy, likely reactive. 4. 8 mm lingular nodule, stable from 07/08/2017. Additional pulmonary nodules seen on prior studies are relatively obscured on today's study in this patient with biopsy-proven low-grade neuroendocrine tumor. Recommend continued attention on follow-up. 5. Cirrhosis. 6. Aortic atherosclerosis (ICD10-I70.0). Coronary artery calcification. 7. Enlarged pulmonic trunk, indicative of pulmonary arterial hypertension. Electronically Signed   By: Lorin Picket M.D.   On: 02/09/2023 16:42     Medications:  Current Medications:  apixaban  2.5 mg Oral BID   benzonatate   200 mg Oral TID   diclofenac Sodium  4 g Topical QID   feeding supplement  237 mL Oral BID BM   fluticasone  2 spray Each Nare Daily   gabapentin  300 mg Oral BID   insulin aspart  0-5 Units Subcutaneous QHS   insulin aspart  0-9 Units Subcutaneous TID WC   insulin glargine-yfgn  10 Units Subcutaneous Daily   levothyroxine  88 mcg Oral QAC breakfast   metoprolol succinate  50 mg Oral Daily   multivitamin with minerals  1 tablet Oral Daily   rosuvastatin  20 mg Oral QHS   sodium chloride flush  3 mL Intravenous Q12H   tolvaptan  15 mg Oral Once    Infusions:  furosemide (LASIX) 200 mg in dextrose 5 % 100 mL (2 mg/mL) infusion 12 mg/hr (02/10/23 0944)     Assessment/Plan   1. Acute on chronic diastolic CHF: In setting of permanent atrial fibrillation.  Last echo in 11/23 with EF 70-75%, moderate AS with mean gradient 23 and AVA 1.37 cm^2, RV normal, PASP 45 mmHg.  I reviewed the echo, and at that time the AS did not look severe.  Diuresis has been limited by cardiorenal syndrome with rising creatinine.  She is now on Bipap.  I reviewed yesterday's CT, she has ground glass infiltrates and pleural effusions suggestive of CHF.  She has a large, partially loculated right pleural effusion and moderate effusion on left. On exam, she does look volume overloaded and is currently on Bipap.  Creatinine up to 1.86 with sodium falling. She has not diuresed well.  - Need to follow strict I/Os and daily weights.  - Will increase Lasix gtt to 12 mg/hr to try to effect better diuresis.  - Will try to wean to heated high flow so she can get po meds.  - When she is on heated high flow, would give tolvaptan 15 mg x 1 given hypervolemic hyponatremia, this should help with diuresis.  - Repeat echo to reassess RV and aortic stenoiss.  - I will arrange for RHC, hopefully tomorrow.  I discussed it with patient but she is on Bipap so I was also able to contact her niece about this.  Could not reach her sons. Based  on cath lab schedule, this will have to be Thursday - Would be reasonable to consider US-guided thoracentesis with IR, start with right.  Would need to hold Eliquis.  - She is very frail, now DNR/DNI.  I fear that we are going to have difficulty improving her situation with worsening renal dysfunction.  2. Hypervolemic hyponatremia: As above, will give tolvaptan 15 mg x 1 when she is back on heated high flow and off Bipap.  Fluid restrict 1500 cc.  3. AKI on CKD stage 3b: Creatinine up to 1.86.  Cardiorenal syndrome.  This is limiting our ability to diurese her.  4. Pleural effusions: CT chest with partially loculated effusions, large on right and moderate on left.  Suspect related to chronic CHF.  - Would consider US-guided thoracentesis by IR. 5. Atrial fibrillation: Permanent.  HR 100s currently, cannot get Toprol XL as she is on Bipap.  - If she can go to heated high flow oxygen, would give Toprol XL 50 mg daily as scheduled.    - If remains on Bipap and HR rises, can use amiodarone gtt for the time being for rate control until she  can resume Toprol XL.  She is off amiodarone due to thyroid abnormalities but could use transiently if needed.  - Continue Eliquis but hold tonight and in am for RHC.  6. CAD: Nonobstructive on 2017 cath.  7. Aortic stenosis: Reviewed 11/23 echo, looks moderate as reported and not severe.  - Repeat echo this admission.   Guarded prognosis, DNR/DNI and very frail with cardiorenal syndrome and recalcitrant diastolic CHF.  Agree with goals of care discussions.  Length of Stay: 7  Loralie Champagne, MD  02/10/2023, 9:45 AM  Advanced Heart Failure Team Pager [phone] (M-F; 7a - 5p)  Please contact [facility] for night-coverage after hours (4p -7a ) and weekends on [email or web address]

## 2023-02-10 NOTE — Progress Notes (Signed)
PT Cancellation Note  Patient Details Name: NIKKY DUBA MRN: [id number] DOB: 01-Aug-1934   Cancelled Treatment:     Attempted to see pt for PT this pm, pt resting quietly on Bipap. Unable to tolerate PT this date. Will continue as appropriate and medically tolerated next available date/time.   Josie Dixon 02/10/2023, 1:21 PM

## 2023-02-11 ENCOUNTER — Inpatient Hospital Stay: Payer: Medicare Other

## 2023-02-11 ENCOUNTER — Encounter: Payer: Self-pay | Admitting: Internal Medicine

## 2023-02-11 DIAGNOSIS — I509 Heart failure, unspecified: Secondary | ICD-10-CM | POA: Diagnosis not present

## 2023-02-11 DIAGNOSIS — I25118 Atherosclerotic heart disease of native coronary artery with other forms of angina pectoris: Secondary | ICD-10-CM

## 2023-02-11 DIAGNOSIS — I5033 Acute on chronic diastolic (congestive) heart failure: Secondary | ICD-10-CM | POA: Diagnosis not present

## 2023-02-11 DIAGNOSIS — J9621 Acute and chronic respiratory failure with hypoxia: Secondary | ICD-10-CM | POA: Diagnosis not present

## 2023-02-11 DIAGNOSIS — I482 Chronic atrial fibrillation, unspecified: Secondary | ICD-10-CM | POA: Diagnosis not present

## 2023-02-11 DIAGNOSIS — J810 Acute pulmonary edema: Secondary | ICD-10-CM | POA: Diagnosis not present

## 2023-02-11 LAB — BODY FLUID CELL COUNT WITH DIFFERENTIAL
Eos, Fluid: 0 %
Lymphs, Fluid: 33 %
Monocyte-Macrophage-Serous Fluid: 40 %
Neutrophil Count, Fluid: 27 %
Total Nucleated Cell Count, Fluid: 679 cu mm

## 2023-02-11 LAB — BLOOD GAS, ARTERIAL
Acid-Base Excess: 9.3 mmol/L — ABNORMAL HIGH (ref 0.0–2.0)
Bicarbonate: 35.7 mmol/L — ABNORMAL HIGH (ref 20.0–28.0)
Delivery systems: POSITIVE
Expiratory PAP: 10 cmH2O
FIO2: 70 %
Inspiratory PAP: 15 cmH2O
Mechanical Rate: 12
O2 Saturation: 97 %
Patient temperature: 37
pCO2 arterial: 55 mmHg — ABNORMAL HIGH (ref 32–48)
pH, Arterial: 7.42 (ref 7.35–7.45)
pO2, Arterial: 79 mmHg — ABNORMAL LOW (ref 83–108)

## 2023-02-11 LAB — PATHOLOGIST SMEAR REVIEW

## 2023-02-11 LAB — CBC
HCT: 25 % — ABNORMAL LOW (ref 36.0–46.0)
Hemoglobin: 8.3 g/dL — ABNORMAL LOW (ref 12.0–15.0)
MCH: 28.1 pg (ref 26.0–34.0)
MCHC: 33.2 g/dL (ref 30.0–36.0)
MCV: 84.7 fL (ref 80.0–100.0)
Platelets: 115 10*3/uL — ABNORMAL LOW (ref 150–400)
RBC: 2.95 MIL/uL — ABNORMAL LOW (ref 3.87–5.11)
RDW: 15.3 % (ref 11.5–15.5)
WBC: 10.1 10*3/uL (ref 4.0–10.5)
nRBC: 0 % (ref 0.0–0.2)

## 2023-02-11 LAB — BASIC METABOLIC PANEL
Anion gap: 13 (ref 5–15)
BUN: 77 mg/dL — ABNORMAL HIGH (ref 8–23)
CO2: 31 mmol/L (ref 22–32)
Calcium: 8.5 mg/dL — ABNORMAL LOW (ref 8.9–10.3)
Chloride: 85 mmol/L — ABNORMAL LOW (ref 98–111)
Creatinine, Ser: 1.85 mg/dL — ABNORMAL HIGH (ref 0.44–1.00)
GFR, Estimated: 26 mL/min — ABNORMAL LOW (ref >60)
Glucose, Bld: 215 mg/dL — ABNORMAL HIGH (ref 70–99)
Potassium: 4.1 mmol/L (ref 3.5–5.1)
Sodium: 129 mmol/L — ABNORMAL LOW (ref 135–145)

## 2023-02-11 LAB — GLUCOSE, CAPILLARY
Glucose-Capillary: 212 mg/dL — ABNORMAL HIGH (ref 70–99)
Glucose-Capillary: 242 mg/dL — ABNORMAL HIGH (ref 70–99)
Glucose-Capillary: 101 mg/dL — ABNORMAL HIGH (ref 70–99)
Glucose-Capillary: 120 mg/dL — ABNORMAL HIGH (ref 70–99)

## 2023-02-11 LAB — SODIUM
Sodium: 127 mmol/L — ABNORMAL LOW (ref 135–145)
Sodium: 129 mmol/L — ABNORMAL LOW (ref 135–145)

## 2023-02-11 LAB — MRSA NEXT GEN BY PCR, NASAL: MRSA by PCR Next Gen: NOT DETECTED

## 2023-02-11 LAB — MAGNESIUM: Magnesium: 2.3 mg/dL (ref 1.7–2.4)

## 2023-02-11 MED ORDER — SODIUM CHLORIDE 0.9 % IV SOLN
12.5000 mg | Freq: Once | INTRAVENOUS | Status: AC
  Administered 2023-02-11: 12.5 mg via INTRAVENOUS
  Filled 2023-02-11: qty 12.5

## 2023-02-11 MED ORDER — SODIUM CHLORIDE 0.9 % IV SOLN: INTRAVENOUS | Status: DC

## 2023-02-11 MED ORDER — AMIODARONE HCL IN DEXTROSE 360-4.14 MG/200ML-% IV SOLN
60.0000 mg/h | INTRAVENOUS | Status: DC
  Administered 2023-02-11 – 2023-02-12 (×4): 30 mg/h via INTRAVENOUS
  Administered 2023-02-13 – 2023-02-15 (×9): 60 mg/h via INTRAVENOUS
  Filled 2023-02-11 (×12): qty 200

## 2023-02-11 MED ORDER — METOPROLOL TARTRATE 5 MG/5ML IV SOLN
2.5000 mg | Freq: Once | INTRAVENOUS | Status: AC
  Administered 2023-02-11: 2.5 mg via INTRAVENOUS
  Filled 2023-02-11: qty 5

## 2023-02-11 MED ORDER — SODIUM CHLORIDE 0.9% FLUSH: 3.0000 mL | INTRAVENOUS | Status: DC | PRN

## 2023-02-11 MED ORDER — ORAL CARE MOUTH RINSE
15.0000 mL | OROMUCOSAL | Status: DC
  Administered 2023-02-11 – 2023-02-15 (×15): 15 mL via OROMUCOSAL

## 2023-02-11 MED ORDER — METOPROLOL SUCCINATE ER 50 MG PO TB24
25.0000 mg | ORAL_TABLET | Freq: Every day | ORAL | Status: DC
  Administered 2023-02-11 – 2023-02-14 (×3): 25 mg via ORAL
  Filled 2023-02-11 (×4): qty 1

## 2023-02-11 MED ORDER — LIDOCAINE HCL (PF) 1 % IJ SOLN
10.0000 mL | Freq: Once | INTRAMUSCULAR | Status: AC
  Administered 2023-02-11: 10 mL via INTRADERMAL

## 2023-02-11 MED ORDER — SODIUM CHLORIDE 0.9 % IV BOLUS: 250.0000 mL | Freq: Once | INTRAVENOUS | Status: DC

## 2023-02-11 MED ORDER — AMIODARONE HCL IN DEXTROSE 360-4.14 MG/200ML-% IV SOLN
INTRAVENOUS | Status: AC
  Filled 2023-02-11: qty 200

## 2023-02-11 MED ORDER — ORAL CARE MOUTH RINSE: 15.0000 mL | OROMUCOSAL | Status: DC | PRN

## 2023-02-11 MED ORDER — ASPIRIN 81 MG PO CHEW
81.0000 mg | CHEWABLE_TABLET | ORAL | Status: AC
  Administered 2023-02-12: 81 mg via ORAL
  Filled 2023-02-11: qty 1

## 2023-02-11 MED ORDER — METOLAZONE 2.5 MG PO TABS
2.5000 mg | ORAL_TABLET | Freq: Once | ORAL | Status: AC
  Administered 2023-02-11: 2.5 mg via ORAL
  Filled 2023-02-11: qty 1

## 2023-02-11 MED ORDER — SODIUM CHLORIDE 0.9 % IV SOLN: 250.0000 mL | INTRAVENOUS | Status: DC | PRN

## 2023-02-11 MED ORDER — CHLORHEXIDINE GLUCONATE CLOTH 2 % EX PADS
6.0000 | MEDICATED_PAD | Freq: Every day | CUTANEOUS | Status: DC
  Administered 2023-02-11 – 2023-02-14 (×4): 6 via TOPICAL

## 2023-02-11 NOTE — Progress Notes (Signed)
Patient ID: [name], female   DOB: [birth year], 89 y.o.   MRN: [id number]     Advanced Heart Failure Rounding Note  PCP-Cardiologist: None   Subjective:    She is on Bipap this morning. Looks fairly comfortable.  Some diuresis yesterday by I/Os, no weights.   Creatinine stable 1.85 though BUN up marginally.  Na 126 => 129 with tolvaptan dose.   HR remains 120s in atrial fibrillation.    Echo: EF 65-70%, moderate LVH, moderate RVE with normal systolic function, PASP 50, mild MR, mild-moderate TR, possible paradoxical low gradient severe AS with mean gradient 25 and AVA 0.59 cm^2.    Objective:   Weight Range: 57.7 kg Body mass index is 23.27 kg/m.   Vital Signs:   Temp:  [97.5 F (36.4 C)-99.2 F (37.3 C)] 98.2 F (36.8 C) (02/14 0755) Pulse Rate:  [52-154] 126 (02/14 0700) Resp:  [15-35] 23 (02/14 0700) BP: (94-123)/(53-76) 121/56 (02/14 0700) SpO2:  [83 %-98 %] 91 % (02/14 0741) FiO2 (%):  [80 %-90 %] 90 % (02/14 0741) Last BM Date : 02/11/23  Weight change: Filed Weights   02/06/23 0500 02/07/23 0318 02/10/23 0517  Weight: 55.3 kg 56 kg 57.7 kg    Intake/Output:   Intake/Output Summary (Last 24 hours) at 02/11/2023 0916 Last data filed at 02/11/2023 0800 Gross per 24 hour  Intake 973.35 ml  Output 950 ml  Net 23.35 ml      Physical Exam    General:  Wearing Bipap HEENT: Normal Neck: Supple. JVP 12-14 cm. Carotids 2+ bilat; no bruits. No lymphadenopathy or thyromegaly appreciated. Cor: PMI nondisplaced. Tachy, irregular rate & rhythm. No rubs, gallops.  2/6 SEM RUSB. Lungs: Decreased bilaterally.  Abdomen: Soft, nontender, nondistended. No hepatosplenomegaly. No bruits or masses. Good bowel sounds. Extremities: No cyanosis, clubbing, rash, edema Neuro: Alert & orientedx3, cranial nerves grossly intact. moves all 4 extremities w/o difficulty. Affect pleasant   Telemetry   Atrial fibrillation 120s, personally reviewed  Labs    CBC Recent Labs     02/09/23 0748  WBC 7.5  NEUTROABS 5.6  HGB 8.5*  HCT 26.6*  MCV 86.9  PLT 641*   Basic Metabolic Panel Recent Labs    02/10/23 0621 02/10/23 1000 02/10/23 1735 02/11/23 0131 02/11/23 0307  NA 126* 126*   < > 127* 129*  K 5.0 4.9  --   --  4.1  CL 88* 88*  --   --  85*  CO2 28 28  --   --  31  GLUCOSE 253* 219*  --   --  215*  BUN 65* 65*  --   --  77*  CREATININE 1.89* 1.85*  --   --  1.85*  CALCIUM 8.3* 8.4*  --   --  8.5*  MG 2.6*  --   --   --  2.3   < > = values in this interval not displayed.   Liver Function Tests Recent Labs    02/10/23 1000  AST 22  ALT 15  ALKPHOS 65  BILITOT 1.2  PROT 6.1*  ALBUMIN 2.7*   No results for input(s): "LIPASE", "AMYLASE" in the last 72 hours. Cardiac Enzymes No results for input(s): "CKTOTAL", "CKMB", "CKMBINDEX", "TROPONINI" in the last 72 hours.  BNP: BNP (last 3 results) Recent Labs    12/01/22 1503 02/22/2023 1316 02/08/23 1016  BNP 228.0* 335.2* 293.7*    ProBNP (last 3 results) Recent Labs    01/12/23 1127  PROBNP  2,095*     D-Dimer Recent Labs    02/08/23 1225  DDIMER 6.25*   Hemoglobin A1C No results for input(s): "HGBA1C" in the last 72 hours. Fasting Lipid Panel No results for input(s): "CHOL", "HDL", "LDLCALC", "TRIG", "CHOLHDL", "LDLDIRECT" in the last 72 hours. Thyroid Function Tests No results for input(s): "TSH", "T4TOTAL", "T3FREE", "THYROIDAB" in the last 72 hours.  Invalid input(s): "FREET3"  Other results:   Imaging    ECHOCARDIOGRAM COMPLETE  Result Date: 02/10/2023    ECHOCARDIOGRAM REPORT   Patient Name:   [name] Date of Exam: 02/10/2023 Medical Rec #:  [id number]        Height:       62.0 in Accession #:    [id number]       Weight:       127.2 lb Date of Birth:  01/30/1934        BSA:          1.577 m Patient Age:    44 years         BP:           101/62 mmHg Patient Gender: F                HR:           92 bpm. Exam Location:  [facility] Procedure: 2D Echo, Cardiac  Doppler, Color Doppler and Intracardiac            Opacification Agent Indications:     CHF-acute diastolic Q00.86  History:         Patient has prior history of Echocardiogram examinations, most                  recent 11/13/2022. CAD, Stroke, Signs/Symptoms:Shortness of                  Breath; Risk Factors:Diabetes.  Sonographer:     Sherrie Sport Referring Phys:  [place] Diagnosing Phys: Nelva Bush MD  Sonographer Comments: Suboptimal apical window. IMPRESSIONS  1. Left ventricular ejection fraction, by estimation, is 65 to 70%. The left ventricle has normal function. The left ventricle has no regional wall motion abnormalities. There is moderate left ventricular hypertrophy. Left ventricular diastolic function  could not be evaluated.  2. Right ventricular systolic function is normal. The right ventricular size is moderately enlarged. There is moderately elevated pulmonary artery systolic pressure. The estimated right ventricular systolic pressure is 76.1 mmHg.  3. Left atrial size was mildly dilated.  4. Right atrial size was moderately dilated.  5. Moderate pleural effusion in the left lateral region.  6. The mitral valve is degenerative. Mild mitral valve regurgitation.  7. Tricuspid valve regurgitation is mild to moderate.  8. The aortic valve has an indeterminant number of cusps. There is moderate thickening of the aortic valve. Aortic valve regurgitation is trivial. Moderate to severe aortic valve stenosis. Aortic valve area, by VTI measures 0.59 cm. Aortic valve mean gradient measures 25.4 mmHg. Aortic valve Vmax measures 3.16 m/s.  9. The inferior vena cava is normal in size with <50% respiratory variability, suggesting right atrial pressure of 8 mmHg. Comparison(s): A prior study was performed on 11/12/2022. Aortic valve gradients are higher and are now in the moderate-severe range (moderate by mean gradient, severe by valve area and dimensionless index). FINDINGS  Left Ventricle: Left  ventricular ejection fraction, by estimation, is 65 to 70%. The left ventricle has normal function. The left ventricle has no regional wall  motion abnormalities. Definity contrast agent was given IV to delineate the left ventricular  endocardial borders. The left ventricular internal cavity size was normal in size. There is moderate left ventricular hypertrophy. Left ventricular diastolic function could not be evaluated due to atrial fibrillation. Left ventricular diastolic function  could not be evaluated. Right Ventricle: The right ventricular size is moderately enlarged. No increase in right ventricular wall thickness. Right ventricular systolic function is normal. There is moderately elevated pulmonary artery systolic pressure. The tricuspid regurgitant  velocity is 3.24 m/s, and with an assumed right atrial pressure of 8 mmHg, the estimated right ventricular systolic pressure is 03.4 mmHg. Left Atrium: Left atrial size was mildly dilated. Right Atrium: Right atrial size was moderately dilated. Pericardium: There is no evidence of pericardial effusion. Mitral Valve: The mitral valve is degenerative in appearance. There is mild thickening of the mitral valve leaflet(s). Mild mitral annular calcification. Mild mitral valve regurgitation. Tricuspid Valve: The tricuspid valve is normal in structure. Tricuspid valve regurgitation is mild to moderate. Aortic Valve: The aortic valve has an indeterminant number of cusps. There is moderate thickening of the aortic valve. Aortic valve regurgitation is trivial. Moderate to severe aortic stenosis is present. Aortic valve mean gradient measures 25.4 mmHg. Aortic valve peak gradient measures 40.0 mmHg. Aortic valve area, by VTI measures 0.59 cm. Pulmonic Valve: The pulmonic valve was normal in structure. Pulmonic valve regurgitation is mild. No evidence of pulmonic stenosis. Aorta: The aortic root is normal in size and structure. Pulmonary Artery: The pulmonary artery is of  normal size. Venous: The inferior vena cava is normal in size with less than 50% respiratory variability, suggesting right atrial pressure of 8 mmHg. IAS/Shunts: No atrial level shunt detected by color flow Doppler. Additional Comments: There is a moderate pleural effusion in the left lateral region.  LEFT VENTRICLE PLAX 2D LVIDd:         3.20 cm LVIDs:         2.20 cm LV PW:         1.50 cm LV IVS:        1.30 cm LVOT diam:     1.89 cm LV SV:         35 LV SV Index:   22 LVOT Area:     2.81 cm  RIGHT VENTRICLE RV Basal diam:  4.40 cm RV Mid diam:    3.80 cm RV S prime:     10.30 cm/s TAPSE (M-mode): 1.6 cm LEFT ATRIUM             Index        RIGHT ATRIUM           Index LA diam:        4.00 cm 2.54 cm/m   RA Area:     23.00 cm LA Vol (A2C):   57.0 ml 36.14 ml/m  RA Volume:   71.90 ml  45.59 ml/m LA Vol (A4C):   61.3 ml 38.87 ml/m LA Biplane Vol: 59.0 ml 37.41 ml/m  AORTIC VALVE                     PULMONIC VALVE AV Area (Vmax):    0.60 cm      PR End Diast Vel: 9.12 msec AV Area (Vmean):   0.58 cm AV Area (VTI):     0.59 cm AV Vmax:           316.40 cm/s AV Vmean:  234.600 cm/s AV VTI:            0.602 m AV Peak Grad:      40.0 mmHg AV Mean Grad:      25.4 mmHg LVOT Vmax:         67.90 cm/s LVOT Vmean:        48.800 cm/s LVOT VTI:          0.126 m LVOT/AV VTI ratio: 0.21  AORTA Ao Root diam: 3.10 cm MITRAL VALVE                TRICUSPID VALVE MV Area (PHT): 4.74 cm     TR Peak grad:   42.0 mmHg MV Decel Time: 160 msec     TR Vmax:        324.00 cm/s MV E velocity: 146.00 cm/s                             SHUNTS                             Systemic VTI:  0.13 m                             Systemic Diam: 1.89 cm Nelva Bush MD Electronically signed by Nelva Bush MD Signature Date/Time: 02/10/2023/3:31:44 PM    Final      Medications:     Scheduled Medications:  benzonatate  200 mg Oral TID   diclofenac Sodium  4 g Topical QID   feeding supplement  237 mL Oral BID BM   fluticasone   2 spray Each Nare Daily   gabapentin  300 mg Oral BID   insulin aspart  0-5 Units Subcutaneous QHS   insulin aspart  0-9 Units Subcutaneous TID WC   insulin glargine-yfgn  10 Units Subcutaneous Daily   levothyroxine  88 mcg Oral QAC breakfast   metolazone  2.5 mg Oral Once   metoprolol succinate  25 mg Oral Daily   multivitamin with minerals  1 tablet Oral Daily   rosuvastatin  20 mg Oral QHS   simethicone  80 mg Oral QID   sodium chloride flush  3 mL Intravenous Q12H    Infusions:  amiodarone     furosemide (LASIX) 200 mg in dextrose 5 % 100 mL (2 mg/mL) infusion 12 mg/hr (02/11/23 0800)    PRN Medications: guaiFENesin-dextromethorphan, ipratropium-albuterol, metoprolol tartrate, morphine injection, polyethylene glycol, senna-docusate, traMADol   Assessment/Plan   1. Acute on chronic diastolic CHF: In setting of permanent atrial fibrillation.  Echo this admission with EF 65-70%, moderate LVH, moderate RVE with normal systolic function, PASP 50, mild MR, mild-moderate TR, possible paradoxical low gradient severe AS with mean gradient 25 and AVA 0.59 cm^2.   Diuresis has been limited by cardiorenal syndrome with rising creatinine.  She is now on Bipap. CT chest showed ground glass infiltrates and pleural effusions suggestive of CHF.  She has a large, partially loculated right pleural effusion and moderate effusion on left. On exam, she still looks volume overloaded and is currently on Bipap.  Stable at 1.85.  - Continue Lasix gtt 12 mg/hr, with Na higher today will give dose of metolazone 2.5 x 1.   - Need to follow strict I/Os and daily weights.  - Plan for RHC tomorrow to assess filling pressure/PA pressure. - Plan for  US-guided thoracentesis with IR, start with right.  Eliquis on hold for this.  - She is very frail, now DNR/DNI.  I fear that we are going to have difficulty improving her situation with worsening renal dysfunction.  2. Hypervolemic hyponatremia: She had tolvaptan 2/13  with Na up to 129.   - Fluid restrict 1500 cc.  3. AKI on CKD stage 3b: Cardiorenal syndrome.  This is limiting our ability to diurese her. Creatinine stable 1.85 today but BUN higher.  4. Pleural effusions: CT chest with partially loculated effusions, large on right and moderate on left.  Suspect related to chronic CHF.  - Hopefully will US-guided thoracentesis by IR but if oxygen requirement is too much for this may need CCM to attempt at bedside in ICU.  5. Atrial fibrillation: Permanent.  HR 120s now.  Has been some reluctance to give Toprol XL with SBP 90s-100s (ok to give).  - With rate control still poor, will add amiodarone gtt 30 mg/hr for now just for rate control.  She has been off amiodarone due to thyroid abnormalities but could use transiently if needed.  - Eliquis held for thoracentesis then RHC.   6. CAD: Nonobstructive on 2017 cath.  7. Aortic stenosis: Suspect paradoxical low flow/low gradient severe AS on echo this admission.  This likely worsens her CHF.  Currently too unstable for TAVR consideration.    Guarded prognosis, DNR/DNI and very frail with cardiorenal syndrome and recalcitrant diastolic CHF.  Agree with goals of care discussions.  Length of Stay: 8  Loralie Champagne, MD  02/11/2023, 9:16 AM  Advanced Heart Failure Team Pager [phone] (M-F; 7a - 5p)  Please contact [facility] for night-coverage after hours (5p -7a ) and weekends on [email or web address]

## 2023-02-11 NOTE — Progress Notes (Signed)
       CROSS COVER NOTE  NAME: [name] MRN: [id number] DOB : 10-30-1934 ATTENDING PHYSICIAN: Sidney Ace, MD    Date of Service   02/11/2023   HPI/Events of Note   Message received from RN reporting HR in 130s with AFIB on the monitor. BP 108/62  Interventions   Assessment/Plan: 2.5 mg IV Metoprolol Evening Metoprolol held last night, will give dose now        To reach the provider On-Call:   7AM- 7PM see care teams to locate the attending and reach out to them via [email or web address] 7PM-7AM contact night-coverage If you still have difficulty reaching the appropriate provider, please page the [facility] (Director on Call) for [facility] on amion for assistance  This document was prepared using Systems analyst and may include unintentional dictation errors.  Neomia Glass DNP, MBA, FNP-BC, PMHNP-BC Nurse Practitioner [facility] Pager [phone]

## 2023-02-11 NOTE — Progress Notes (Deleted)
   Patient ID: AVANTHIKA DEHNERT, female    DOB: April 12, 1934, 89 y.o.   MRN: [id number]  HPI  Ms Shenoy is a 89 y/o female with a history of   Echo 02/10/23 showed an EF of 65-70% along with moderate LVH, moderately elevated PA pressure of 50.0 mmHg, mild LAE, moderate RAE, moderate pleural effusion in the left lateral region, mild MR, mild/ moderate TR and mod/severe AS.   [place] 06/24/16: Coronary angiogram: Mild to moderate diffuse coronary artery disease without high-grade stenosis. Proximal LAD with a 40-50% stenosis, in some views at most 50-60% stenosed and calcified. Small circumflex, mildly diseased large right coronary artery.  Aortic valve not crossed. Difficulty in crossing. Patient has history of mild aortic stenosis by echocardiogram April 2017.  Right heart catheterization revealing mild pulmonary hypertension, PA mean pressure of 26 mmHg, wedge mean was 13 mmHg. Cardiac output 3.7, cardiac index 2.3 by Fick. There was no significant shunt, QP: QS 0.79. RA mean 3 mmHg, RA saturation 59%.; RV 34/0, EDP 3 mmHg.; PTA 36/17, mean 26 mmHg. PA saturation 49%.; Pulmonary wedge mean of 13 mmHg.   Review of Systems    Physical Exam

## 2023-02-11 NOTE — Procedures (Signed)
PROCEDURE SUMMARY:  Successful US guided diagnostic and therapeutic right thoracentesis. Yielded 1 L of clear, amber fluid. Pt tolerated procedure well. No immediate complications.  Specimen was sent for labs. CXR ordered.  EBL < 1 mL  Tyson Alias, AGNP 02/11/2023 2:54 PM

## 2023-02-11 NOTE — Progress Notes (Signed)
Nutrition Follow-up  DOCUMENTATION CODES:   Non-severe (moderate) malnutrition in context of chronic illness  INTERVENTION:   -Continue Ensure Enlive po BID, each supplement provides 350 kcal and 20 grams of protein -Magic cup TID with meals, each supplement provides 290 kcal and 9 grams of protein  -MVI with minerals daily -If poor po intake persists, consider alternative means of nutrition/ hydration if this aligns with pt's goals of care:   Initiate Osmolite 1.2 @ 20 ml/hr and increase by 10 ml every 12 hours to goal rate of 60 ml/hr.   30 ml free water flush every 4 hours to maintain tube patency  Tube feeding regimen provides 1728 kcal (100% of needs), 80 grams of protein, and 1181 ml of H2O. Total free water: 1361 ml daily   -If feedings are started, monitor Mg, K, and Phos and replete as needed secondary to high refeeding risk  NUTRITION DIAGNOSIS:   Moderate Malnutrition related to chronic illness (CHF) as evidenced by moderate fat depletion, mild fat depletion, mild muscle depletion, moderate muscle depletion.  Ongoing  GOAL:   Patient will meet greater than or equal to 90% of their needs  Unmet  MONITOR:   PO intake, Supplement acceptance  REASON FOR ASSESSMENT:   Consult Assessment of nutrition requirement/status  ASSESSMENT:   Pt with hypertension, hyperlipidemia, atrial fibrillation on Eliquis, CAD, history of a stroke, mild pulmonary hypertension, stage IV neuroendocrine tumor favor carcinoid, on chornic who presents for chief concerns of shortness of breath.  Reviewed I/O's: -251 ml x 24 hours and -5.8 L since admission  UOP: 1.2 L x 24 hours   Pt currently on Bi-pap.   Palliative care following for goals of care. Pt is a DNR/DNI. Family is hopeful for improved outcomes. Per MD notes, pt with guarded prognosis, especially given cardiorenal syndrome.   Pt with bilateral pleural effusions. Plan for thoracentesis today and RHC tomorrow (02/23/2023).    Pt currently on a regular diet. Noted poor oral intake; 0-25%. Pt also with minimal acceptance of Ensure supplements. May need to consider alternative means of nutrition/ hydration if this aligns with pt's goals of care.   Medications reviewed and include IV lasix.   Labs reviewed: Na: 129, CBGS: 212 (inpatient orders for glycemic control are 0-5 units insulin aspart daily at bedtime, 0-9 units insulin aspart TID with meals, and 10 units insulin glargine-yfgn daily).    Diet Order:   Diet Order             Diet NPO time specified Except for: Sips with Meds  Diet effective midnight                   EDUCATION NEEDS:   Education needs have been addressed  Skin:  Skin Assessment: Reviewed RN Assessment  Last BM:  02/11/23 (type 6)  Height:   Ht Readings from Last 1 Encounters:  02/11/23 5\' 2"  (1.575 m)    Weight:   Wt Readings from Last 1 Encounters:  02/11/23 58.9 kg    Ideal Body Weight:  50 kg  BMI:  Body mass index is 23.75 kg/m.  Estimated Nutritional Needs:   Kcal:  1600-1800  Protein:  80-95 grams  Fluid:  > 1.6 L    Loistine Chance, RD, LDN, [place] Registered Dietitian II Certified Diabetes Care and Education Specialist Please refer to [facility] for RD and/or RD on-call/weekend/after hours pager

## 2023-02-11 NOTE — Telephone Encounter (Signed)
Patient still admitted 

## 2023-02-11 NOTE — Progress Notes (Signed)
Progress Note    JERAE IZARD  YBO:[id number] DOB: 1934-09-12  DOA: 02/25/2023 PCP: [name], FNP      Brief Narrative:    Medical records reviewed and are as summarized below:  SHEELA MCCULLEY is a 89 y.o. female with past medical history significant for hypertension, hyperlipidemia, atrial fibrillation on Eliquis, CAD, history of a stroke, mild pulmonary hypertension, stage IV neuroendocrine tumor favor carcinoid, on chornic who presents emergency department for chief concerns of shortness of breath.   Presentation consistent with acute decompensated diastolic heart failure.  Fluid status has been difficult to manage.  Patient has been aggressively diuresed however repeat chest x-ray with persistent pulmonary edema pattern.   2/11: Respiratory status continues to decline.  This morning requiring 15 L high flow nasal cannula with saturations in the low to mid 80s.  Shortness of breath persistent.  Initiated on BiPAP.  Cardiology consulted.   2/12: Shortness of breath persistent.  Patient currently on 10 L high flow nasal cannula.  Required BiPAP for several hours yesterday and then again overnight.  Volume status difficult to ascertain.  Attempting to require a chest CT without contrast.  If pleural effusions patient may require thoracentesis.  Palliative care consult appreciated.  [name]   2/13: Respiratory status remains quite tenuous.  Diuresis limited by cardiorenal syndrome worsening creatinine and hyponatremia.  Heart failure service consulted.  Started on Lasix gtt.     Assessment/Plan:   Principal Problem:   Acute on chronic hypoxic respiratory failure (HCC) Active Problems:   Acute exacerbation of CHF (congestive heart failure) (HCC)   Acute respiratory failure with hypoxia (HCC)   CAD (coronary artery disease)   Insulin dependent type 2 diabetes mellitus (HCC)   Stage 3b  chronic kidney disease (CKD) (HCC)   Carcinoid tumor of lung   HTN (hypertension)   Hypothyroid   Physical deconditioning   Protein-calorie malnutrition, severe   Atrial fibrillation, chronic (HCC)   Hyperlipidemia   Dysuria   Protein-calorie malnutrition, moderate ([place])   Nutrition Problem: Moderate Malnutrition Etiology: chronic illness (CHF)  Signs/Symptoms: moderate fat depletion, mild fat depletion, mild muscle depletion, moderate muscle depletion   Body mass index is 23.75 kg/m.   Acute on chronic diastolic CHF: Continue IV Lasix drip.  Monitor BMP, daily weight and urine output.  2D echo showed EF estimated at 65 to 70%, moderate LVH, PASP 50, mild MR, mild to moderate TR, severe aortic stenosis   Bilateral pleural effusion: Plan for right-sided thoracentesis today.   Acute on chronic hypoxic respiratory failure: She is on BiPAP with FiO2 of 90%.  Transferred to stepdown unit for close monitoring.  She uses 2 to 3 L/min oxygen at baseline   Permanent atrial fibrillation with RVR: She has been started on IV amiodarone drip.  Continue metoprolol.  Eliquis held for thoracentesis   Hypovolemic hyponatremia: S/p tolvaptan on 02/10/2023.  Monitor sodium level closely.  Continue fluid restriction 1500 mL.   AKI on CKD stage IIIb, cardiorenal syndrome: This limits aggressive diuresis.  Monitor BMP   Type II DM: Continue insulin glargine and NovoLog   CAD, aortic stenosis: Not a good candidate for TAVR.  Per cardiologist.   Dysuria: Completed 3 days of IV Rocephin.   Other comorbidities include history of stroke, severe protein calorie malnutrition, hypothyroidism, hypertension   Diet Order  Diet NPO time specified Except for: Sips with Meds  Diet effective midnight                            Consultants: Cardiologist Palliative care  Procedures: None    Medications:    [START ON 02/18/2023] aspirin  81 mg Oral Pre-Cath    benzonatate  200 mg Oral TID   Chlorhexidine Gluconate Cloth  6 each Topical Daily   diclofenac Sodium  4 g Topical QID   feeding supplement  237 mL Oral BID BM   fluticasone  2 spray Each Nare Daily   gabapentin  300 mg Oral BID   insulin aspart  0-5 Units Subcutaneous QHS   insulin aspart  0-9 Units Subcutaneous TID WC   insulin glargine-yfgn  10 Units Subcutaneous Daily   levothyroxine  88 mcg Oral QAC breakfast   metoprolol succinate  25 mg Oral Daily   multivitamin with minerals  1 tablet Oral Daily   mouth rinse  15 mL Mouth Rinse 4 times per day   rosuvastatin  20 mg Oral QHS   simethicone  80 mg Oral QID   sodium chloride flush  3 mL Intravenous Q12H   Continuous Infusions:  sodium chloride     [START ON 01/31/2023] sodium chloride     amiodarone 30 mg/hr (02/11/23 1119)   amiodarone     furosemide (LASIX) 200 mg in dextrose 5 % 100 mL (2 mg/mL) infusion 12 mg/hr (02/11/23 1119)     Anti-infectives (From admission, onward)    Start     Dose/Rate Route Frequency Ordered Stop   02/04/23 1500  cefTRIAXone (ROCEPHIN) 1 g in sodium chloride 0.9 % 100 mL IVPB        1 g 200 mL/hr over 30 Minutes Intravenous Every 24 hours 02/04/23 1425 02/06/23 1509              Family Communication/Anticipated D/C date and plan/Code Status   DVT prophylaxis: Place TED hose Start: 02/24/2023 2033     Code Status: DNR  Family Communication: None Disposition Plan: Plan to discharge to SNF when medically stable   Status is: Inpatient Remains inpatient appropriate because: CHF exacerbation      Subjective:   Interval events noted.  Objective:    Vitals:   02/11/23 0741 02/11/23 0755 02/11/23 1000 02/11/23 1100  BP:   103/72 112/67  Pulse:   (!) 126   Resp:   (!) 24 (!) 32  Temp:  98.2 F (36.8 C)  98 F (36.7 C)  TempSrc:  Oral  Axillary  SpO2: 91%  95% 93%  Weight:    58.9 kg  Height:    5\' 2"  (1.575 m)   No data found.   Intake/Output Summary (Last 24  hours) at 02/11/2023 1237 Last data filed at 02/11/2023 1119 Gross per 24 hour  Intake 718.02 ml  Output 1275 ml  Net -556.98 ml   Filed Weights   02/07/23 0318 02/10/23 0517 02/11/23 1100  Weight: 56 kg 57.7 kg 58.9 kg    Exam:  GEN: NAD, on BiPAP SKIN: No rash EYES: EOMI ENT: MMM CV: Irregular rate and rhythm, tachycardic PULM: B/l rhonchi ABD: soft, ND, NT, +BS CNS: AAO x 3, non focal EXT: No edema or tenderness        Data Reviewed:   I have personally reviewed following labs and imaging studies:  Labs: Labs show the following:   Basic  Metabolic Panel: Recent Labs  Lab 02/06/23 2158 02/07/23 0225 02/08/23 1016 02/09/23 0748 02/10/23 0621 02/10/23 1000 02/10/23 1735 02/11/23 0131 02/11/23 0307 02/11/23 0958  NA  --    < > 131* 129* 126* 126* 127* 127* 129* 129*  K 3.2*   < > 3.3* 3.1* 5.0 4.9  --   --  4.1  --   CL  --    < > 92* 89* 88* 88*  --   --  85*  --   CO2  --    < > 28 29 28 28   --   --  31  --   GLUCOSE  --    < > 184* 200* 253* 219*  --   --  215*  --   BUN  --    < > 44* 50* 65* 65*  --   --  77*  --   CREATININE  --    < > 1.39* 1.54* 1.89* 1.85*  --   --  1.85*  --   CALCIUM  --    < > 8.1* 8.1* 8.3* 8.4*  --   --  8.5*  --   MG 1.5*  --  2.2 1.9 2.6*  --   --   --  2.3  --    < > = values in this interval not displayed.   GFR Estimated Creatinine Clearance: 17.3 mL/min (A) (by C-G formula based on SCr of 1.85 mg/dL (H)). Liver Function Tests: Recent Labs  Lab 02/10/23 1000  AST 22  ALT 15  ALKPHOS 65  BILITOT 1.2  PROT 6.1*  ALBUMIN 2.7*   No results for input(s): "LIPASE", "AMYLASE" in the last 168 hours. No results for input(s): "AMMONIA" in the last 168 hours. Coagulation profile No results for input(s): "INR", "PROTIME" in the last 168 hours.  CBC: Recent Labs  Lab 02/09/23 0748 02/11/23 0958  WBC 7.5 10.1  NEUTROABS 5.6  --   HGB 8.5* 8.3*  HCT 26.6* 25.0*  MCV 86.9 84.7  PLT 131* 115*   Cardiac  Enzymes: No results for input(s): "CKTOTAL", "CKMB", "CKMBINDEX", "TROPONINI" in the last 168 hours. BNP (last 3 results) Recent Labs    01/12/23 1127  PROBNP 2,095*   CBG: Recent Labs  Lab 02/10/23 1115 02/10/23 1704 02/10/23 2053 02/11/23 0754 02/11/23 1115  GLUCAP 186* 213* 237* 212* 242*   D-Dimer: No results for input(s): "DDIMER" in the last 72 hours. Hgb A1c: No results for input(s): "HGBA1C" in the last 72 hours. Lipid Profile: No results for input(s): "CHOL", "HDL", "LDLCALC", "TRIG", "CHOLHDL", "LDLDIRECT" in the last 72 hours. Thyroid function studies: No results for input(s): "TSH", "T4TOTAL", "T3FREE", "THYROIDAB" in the last 72 hours.  Invalid input(s): "FREET3" Anemia work up: No results for input(s): "VITAMINB12", "FOLATE", "FERRITIN", "TIBC", "IRON", "RETICCTPCT" in the last 72 hours. Sepsis Labs: Recent Labs  Lab 02/09/23 0748 02/11/23 0958  WBC 7.5 10.1    Microbiology Recent Results (from the past 240 hour(s))  Resp panel by RT-PCR (RSV, Flu A&B, Covid) Anterior Nasal Swab     Status: None   Collection Time: 02/18/2023  5:58 PM   Specimen: Anterior Nasal Swab  Result Value Ref Range Status   SARS Coronavirus 2 by RT PCR NEGATIVE NEGATIVE Final    Comment: (NOTE) SARS-CoV-2 target nucleic acids are NOT DETECTED.  The SARS-CoV-2 RNA is generally detectable in upper respiratory specimens during the acute phase of infection. The lowest concentration of SARS-CoV-2 viral copies this  assay can detect is 138 copies/mL. A negative result does not preclude SARS-Cov-2 infection and should not be used as the sole basis for treatment or other patient management decisions. A negative result may occur with  improper specimen collection/handling, submission of specimen other than nasopharyngeal swab, presence of viral mutation(s) within the areas targeted by this assay, and inadequate number of viral copies(<138 copies/mL). A negative result must be combined  with clinical observations, patient history, and epidemiological information. The expected result is Negative.  Fact Sheet for Patients:  EntrepreneurPulse.com.au  Fact Sheet for Healthcare Providers:  IncredibleEmployment.be  This test is no t yet approved or cleared by the [place] FDA and  has been authorized for detection and/or diagnosis of SARS-CoV-2 by FDA under an Emergency Use Authorization (EUA). This EUA will remain  in effect (meaning this test can be used) for the duration of the COVID-19 declaration under Section 564(b)(1) of the Act, 21 U.S.C.section 360bbb-3(b)(1), unless the authorization is terminated  or revoked sooner.       Influenza A by PCR NEGATIVE NEGATIVE Final   Influenza B by PCR NEGATIVE NEGATIVE Final    Comment: (NOTE) The Xpert Xpress SARS-CoV-2/FLU/RSV plus assay is intended as an aid in the diagnosis of influenza from Nasopharyngeal swab specimens and should not be used as a sole basis for treatment. Nasal washings and aspirates are unacceptable for Xpert Xpress SARS-CoV-2/FLU/RSV testing.  Fact Sheet for Patients: EntrepreneurPulse.com.au  Fact Sheet for Healthcare Providers: IncredibleEmployment.be  This test is not yet approved or cleared by the [place] FDA and has been authorized for detection and/or diagnosis of SARS-CoV-2 by FDA under an Emergency Use Authorization (EUA). This EUA will remain in effect (meaning this test can be used) for the duration of the COVID-19 declaration under Section 564(b)(1) of the Act, 21 U.S.C. section 360bbb-3(b)(1), unless the authorization is terminated or revoked.     Resp Syncytial Virus by PCR NEGATIVE NEGATIVE Final    Comment: (NOTE) Fact Sheet for Patients: EntrepreneurPulse.com.au  Fact Sheet for Healthcare Providers: IncredibleEmployment.be  This test is not yet approved  or cleared by the [place] FDA and has been authorized for detection and/or diagnosis of SARS-CoV-2 by FDA under an Emergency Use Authorization (EUA). This EUA will remain in effect (meaning this test can be used) for the duration of the COVID-19 declaration under Section 564(b)(1) of the Act, 21 U.S.C. section 360bbb-3(b)(1), unless the authorization is terminated or revoked.  Performed at [facility], [address] [place],  [address]     Procedures and diagnostic studies:  ECHOCARDIOGRAM COMPLETE  Result Date: 02/10/2023    ECHOCARDIOGRAM REPORT   Patient Name:   SONA NATIONS Date of Exam: 02/10/2023 Medical Rec #:  [id number]        Height:       62.0 in Accession #:    [id number]       Weight:       127.2 lb Date of Birth:  [birth year]        BSA:          1.577 m Patient Age:    89 years         BP:           101/62 mmHg Patient Gender: F                HR:           92 bpm. Exam Location:  [facility] Procedure: 2D Echo, Cardiac Doppler,  Color Doppler and Intracardiac            Opacification Agent Indications:     CHF-acute diastolic V25.36  History:         Patient has prior history of Echocardiogram examinations, most                  recent 11/13/2022. CAD, Stroke, Signs/Symptoms:Shortness of                  Breath; Risk Factors:Diabetes.  Sonographer:     Sherrie Sport Referring Phys:  [place] Diagnosing Phys: Nelva Bush MD  Sonographer Comments: Suboptimal apical window. IMPRESSIONS  1. Left ventricular ejection fraction, by estimation, is 65 to 70%. The left ventricle has normal function. The left ventricle has no regional wall motion abnormalities. There is moderate left ventricular hypertrophy. Left ventricular diastolic function  could not be evaluated.  2. Right ventricular systolic function is normal. The right ventricular size is moderately enlarged. There is moderately elevated pulmonary artery systolic pressure. The estimated right ventricular  systolic pressure is 64.4 mmHg.  3. Left atrial size was mildly dilated.  4. Right atrial size was moderately dilated.  5. Moderate pleural effusion in the left lateral region.  6. The mitral valve is degenerative. Mild mitral valve regurgitation.  7. Tricuspid valve regurgitation is mild to moderate.  8. The aortic valve has an indeterminant number of cusps. There is moderate thickening of the aortic valve. Aortic valve regurgitation is trivial. Moderate to severe aortic valve stenosis. Aortic valve area, by VTI measures 0.59 cm. Aortic valve mean gradient measures 25.4 mmHg. Aortic valve Vmax measures 3.16 m/s.  9. The inferior vena cava is normal in size with <50% respiratory variability, suggesting right atrial pressure of 8 mmHg. Comparison(s): A prior study was performed on 11/12/2022. Aortic valve gradients are higher and are now in the moderate-severe range (moderate by mean gradient, severe by valve area and dimensionless index). FINDINGS  Left Ventricle: Left ventricular ejection fraction, by estimation, is 65 to 70%. The left ventricle has normal function. The left ventricle has no regional wall motion abnormalities. Definity contrast agent was given IV to delineate the left ventricular  endocardial borders. The left ventricular internal cavity size was normal in size. There is moderate left ventricular hypertrophy. Left ventricular diastolic function could not be evaluated due to atrial fibrillation. Left ventricular diastolic function  could not be evaluated. Right Ventricle: The right ventricular size is moderately enlarged. No increase in right ventricular wall thickness. Right ventricular systolic function is normal. There is moderately elevated pulmonary artery systolic pressure. The tricuspid regurgitant  velocity is 3.24 m/s, and with an assumed right atrial pressure of 8 mmHg, the estimated right ventricular systolic pressure is 03.4 mmHg. Left Atrium: Left atrial size was mildly dilated. Right  Atrium: Right atrial size was moderately dilated. Pericardium: There is no evidence of pericardial effusion. Mitral Valve: The mitral valve is degenerative in appearance. There is mild thickening of the mitral valve leaflet(s). Mild mitral annular calcification. Mild mitral valve regurgitation. Tricuspid Valve: The tricuspid valve is normal in structure. Tricuspid valve regurgitation is mild to moderate. Aortic Valve: The aortic valve has an indeterminant number of cusps. There is moderate thickening of the aortic valve. Aortic valve regurgitation is trivial. Moderate to severe aortic stenosis is present. Aortic valve mean gradient measures 25.4 mmHg. Aortic valve peak gradient measures 40.0 mmHg. Aortic valve area, by VTI measures 0.59 cm. Pulmonic Valve: The pulmonic valve was  normal in structure. Pulmonic valve regurgitation is mild. No evidence of pulmonic stenosis. Aorta: The aortic root is normal in size and structure. Pulmonary Artery: The pulmonary artery is of normal size. Venous: The inferior vena cava is normal in size with less than 50% respiratory variability, suggesting right atrial pressure of 8 mmHg. IAS/Shunts: No atrial level shunt detected by color flow Doppler. Additional Comments: There is a moderate pleural effusion in the left lateral region.  LEFT VENTRICLE PLAX 2D LVIDd:         3.20 cm LVIDs:         2.20 cm LV PW:         1.50 cm LV IVS:        1.30 cm LVOT diam:     1.89 cm LV SV:         35 LV SV Index:   22 LVOT Area:     2.81 cm  RIGHT VENTRICLE RV Basal diam:  4.40 cm RV Mid diam:    3.80 cm RV S prime:     10.30 cm/s TAPSE (M-mode): 1.6 cm LEFT ATRIUM             Index        RIGHT ATRIUM           Index LA diam:        4.00 cm 2.54 cm/m   RA Area:     23.00 cm LA Vol (A2C):   57.0 ml 36.14 ml/m  RA Volume:   71.90 ml  45.59 ml/m LA Vol (A4C):   61.3 ml 38.87 ml/m LA Biplane Vol: 59.0 ml 37.41 ml/m  AORTIC VALVE                     PULMONIC VALVE AV Area (Vmax):    0.60 cm       PR End Diast Vel: 9.12 msec AV Area (Vmean):   0.58 cm AV Area (VTI):     0.59 cm AV Vmax:           316.40 cm/s AV Vmean:          234.600 cm/s AV VTI:            0.602 m AV Peak Grad:      40.0 mmHg AV Mean Grad:      25.4 mmHg LVOT Vmax:         67.90 cm/s LVOT Vmean:        48.800 cm/s LVOT VTI:          0.126 m LVOT/AV VTI ratio: 0.21  AORTA Ao Root diam: 3.10 cm MITRAL VALVE                TRICUSPID VALVE MV Area (PHT): 4.74 cm     TR Peak grad:   42.0 mmHg MV Decel Time: 160 msec     TR Vmax:        324.00 cm/s MV E velocity: 146.00 cm/s                             SHUNTS                             Systemic VTI:  0.13 m  Systemic Diam: 1.89 cm Nelva Bush MD Electronically signed by Nelva Bush MD Signature Date/Time: 02/10/2023/3:31:44 PM    Final    CT CHEST WO CONTRAST  Result Date: 02/09/2023 CLINICAL DATA:  Chronic shortness of breath. EXAM: CT CHEST WITHOUT CONTRAST TECHNIQUE: Multidetector CT imaging of the chest was performed following the standard protocol without IV contrast. RADIATION DOSE REDUCTION: This exam was performed according to the departmental dose-optimization program which includes automated exposure control, adjustment of the mA and/or kV according to patient size and/or use of iterative reconstruction technique. COMPARISON:  11/12/2022 and 07/08/2017. FINDINGS: Cardiovascular: Atherosclerotic calcification of the aorta, aortic valve and coronary arteries. Enlarged pulmonic trunk and heart. No pericardial effusion. Mediastinum/Nodes: Mediastinal lymph nodes measure up to 15 mm in the low right paratracheal station, increased slightly from 10 mm. Hilar regions are difficult to evaluate without IV contrast. No axillary adenopathy. Esophagus is grossly unremarkable. Lungs/Pleura: Diffuse centrilobular ground-glass and septal thickening with large, partially loculated pleural effusions. Collapse/consolidation in both lower lobes. 8 mm left upper  lobe nodule (3/48), unchanged from 07/08/2017. Airway is unremarkable. Upper Abdomen: Liver margin is slightly irregular. Visualized portions of the liver, adrenal glands, spleen pancreas and stomach are otherwise grossly unremarkable. Musculoskeletal: Degenerative changes in the spine. Upper and lower thoracic compression deformities, unchanged. IMPRESSION: 1. Congestive heart failure. Pleural effusions are partially loculated. 2. Probable compressive atelectasis in both lower lobes. Difficult to exclude superimposed pneumonia. 3. Mediastinal adenopathy, likely reactive. 4. 8 mm lingular nodule, stable from 07/08/2017. Additional pulmonary nodules seen on prior studies are relatively obscured on today's study in this patient with biopsy-proven low-grade neuroendocrine tumor. Recommend continued attention on follow-up. 5. Cirrhosis. 6. Aortic atherosclerosis (ICD10-I70.0). Coronary artery calcification. 7. Enlarged pulmonic trunk, indicative of pulmonary arterial hypertension. Electronically Signed   By: Lorin Picket M.D.   On: 02/09/2023 16:42               LOS: 8 days   Breckan Cafiero  [facility]   Pager on [email or web address]. If 7PM-7AM, please contact night-coverage at [email or web address]     02/11/2023, 12:37 PM

## 2023-02-11 NOTE — Progress Notes (Signed)
PT Cancellation Note  Patient Details Name: [name] MRN: [id number] DOB: 09/09/1934   Cancelled Treatment:     Hold PT this date due to pt being transferred to ICU. Orders continued, will re-attempt next available date/time as tolerated.   Josie Dixon 02/11/2023, 1:52 PM

## 2023-02-11 NOTE — Progress Notes (Signed)
Palliative Care Progress Note, Assessment & Plan   Patient Name: [name]       Date: 02/11/2023 DOB: [birth year]  Age: 89 y.o. MRN#: [id number] Attending Physician: [name], MD Primary Care Physician: [name], FNP Admit Date: 01/30/2023  Subjective: Patient is lying in bed with BiPAP in place.  She does not acknowledge my presence.  Her son [name] and his wife are at bedside.  HPI: 89 y.o. female  with past medical history of atrial fibrillation on Eliquis, mild pulmonary hypertension, stage IV neuroendocrine tumor favor carcinoid, CKD stage 3b at baseline, T2DM, and chronic diastolic heart failure. Admitted on 02/18/2023 from home with increased shortness of breath for the last 2-3 days. Symptoms found to be related to acute decompensated diastolic heart failure. Has received aggressive diuresis during hospital stay with difficult management of fluid status. Imaging with persistent pulmonary edema pattern.   2/11- Declining respiratory status, Placed on BiPAP, A. Fib with rates in 120's, Increasing and addition of diuretics   2/12-SOB, 10 L high flow nasal cannula as well as BiPAP during the day and overnight.   2/13-respiratory status remains tenuous.  Heart failure team consulted and plan for right heart cath on Thursday.  Lasix gtt. started.  2/14 - transferred to ICU, bipap need increased   Palliative medicine was consulted for assisting with goals of care conversations  Summary of counseling/coordination of care:  son and daughter-in-law in regards to plan and goals of care.  Medical update provided.  Questions and concerns regarding heart cath, BiPAP, kidney function, fluid intake, and long-term prognosis addressed.  We discussed that patient has required BiPAP for  increasingly longer amounts of time.  Family shares they are hopeful that she will be able to come off of the BiPAP and talk with them about what she would want her goals and wishes to be.  I shared that from my previous discussions the patient made it clear that if she is still breathing then she still has a chance to live - and she wants everything done short of CPR and a ventilator to keep her alive. Family was in agreement with the sentiments.  Quality of life, living vs being kept alive discussed.  Therapeutic silence and active listening provided for patient's family to share their thoughts and emotions regarding patient's current health status.  Patient's son and wife believe that patient would not want to live like this.  However, we discussed that hopefully BiPAP is a temporary means to support patient's respiratory status.  DNR remains.  Discussed with family that this is only applicable in the event that patient is pulseless and is not breathing.  All other decisions as far as medical care will be discussed with family.  PMT will continue to follow and support the patient and her family throughout this hospitalization.  Physical Exam Constitutional:      General: She is not in acute distress.    Appearance: She is ill-appearing.  HENT:     Head: Normocephalic.  Cardiovascular:     Rate and Rhythm: Tachycardia present.     Pulses: Normal pulses.  Pulmonary:     Comments: bipap Musculoskeletal:     Comments:  Generalized weakness  Skin:    General: Skin is warm and dry.             Total Time 50 minutes   Danitra Payano L. Ilsa Iha, FNP-BC Palliative Medicine Team Team Phone # [phone]

## 2023-02-11 NOTE — TOC Progression Note (Signed)
Transition of Care [facility]) - Progression Note    Patient Details  Name: [name] MRN: [id number] Date of Birth: 23-May-1934  Transition of [facility]) CM/SW Contact  Laurena Slimmer, RN Phone Number: 02/11/2023, 8:52 AM  Clinical Narrative:    Patient is LTACH appropriate.   Spoke with patient and her son, [name] regarding LTACH. RNCM explain LTACH and provided information for [facility]. Patient and her son are interested but would like to meet as a family along with [name], patient son before making a decision. They were agreeable for a representative from [facility] to follow up with them tomorrow.   Spoke with Lorenza Burton from [facility] to advised patient is interested in [facility] and would like to meet as a family first following up.    Expected Discharge Plan:  (TBD) Barriers to Discharge: Continued Medical Work up  Expected Discharge Plan and Services                                               Social Determinants of Health (SDOH) Interventions SDOH Screenings   Food Insecurity: No Food Insecurity (02/04/2023)  Housing: Low Risk  (02/04/2023)  Transportation Needs: No Transportation Needs (02/04/2023)  Utilities: Not At Risk (02/04/2023)  Tobacco Use: Low Risk  (02/11/2023)    Readmission Risk Interventions    12/05/2022    2:46 PM  Readmission Risk Prevention Plan  PCP or Specialist appointment within 3-5 days of discharge Complete  SW Recovery Care/Counseling Consult Complete  Palliative Care Screening Complete  [place] Complete

## 2023-02-11 NOTE — Inpatient Diabetes Management (Addendum)
Inpatient Diabetes Program Recommendations  AACE/ADA: New Consensus Statement on Inpatient Glycemic Control (2015)  Target Ranges:  Prepandial:   less than 140 mg/dL      Peak postprandial:   less than 180 mg/dL (1-2 hours)      Critically ill patients:  140 - 180 mg/dL    Latest Reference Range & Units 02/10/23 07:25 02/10/23 11:15 02/10/23 17:04 02/10/23 20:53  Glucose-Capillary 70 - 99 mg/dL 254 (H)  5 units Novolog @0913  186 (H)  2 units Novolog  10 units Semglee @1202   213 (H)  3 units Novolog  237 (H)  2 units Novolog   (H): Data is abnormally high  Latest Reference Range & Units 02/11/23 07:54  Glucose-Capillary 70 - 99 mg/dL 212 (H)  (H): Data is abnormally high   Home DM Meds: Januvia 50 mg daily                             Novolog SSI     Current Orders: Semglee 10 units daily  Novolog 0-9 units TID ac/hs        MD- Note CBG 212 this AM   Per Chart Review, it looks like pt was using [place] Insulin 12 units QHS at home prior to admission on 12/01/2022--Not sure why basal insulin not listed on Home Med Rec   1. Please consider increasing the Semglee to 12 units Daily   2. Please also Increase the Novolog SSI to the 0-15 unit Moderate scale       --Will follow patient during hospitalization--   Wyn Quaker RN, MSN, [place] Diabetes Coordinator Inpatient Glycemic Control Team Team Pager: [phone] (8a-5p)

## 2023-02-11 NOTE — Consult Note (Signed)
.  MEDICATION RELATED CONSULT NOTE - INITIAL   Pharmacy Consult for Tolvaptan Indication: hypervolemic hyponatremia   Allergies  Allergen Reactions   Zofran [Ondansetron] Other (See Comments)    Made me deathly sick   Codeine Nausea And Vomiting   Hydrocodone-Acetaminophen Nausea And Vomiting    Patient Measurements: Height: 5\' 2"  (157.5 cm) Weight: 57.7 kg (127 lb 3.3 oz) IBW/kg (Calculated) : 50.1  Vital Signs: Temp: 98.3 F (36.8 C) (02/13 2359) Temp Source: Oral (02/13 2359) BP: 108/62 (02/14 0152) Pulse Rate: 125 (02/14 0152) Intake/Output from previous day: 02/13 0701 - 02/14 0700 In: 669 [P.O.:669] Out: 800 [Urine:800] Intake/Output from this shift: No intake/output data recorded.  Estimated Creatinine Clearance: 17.3 mL/min (A) (by C-G formula based on SCr of 1.85 mg/dL (H)).   Medical History: Past Medical History:  Diagnosis Date   Acute diverticulitis 06/2017   Anemia 06/2017   CAD (coronary artery disease) 03/13/2012   Diabetes mellitus    Dysrhythmia    ATRIAL FIB   Fall 07/07/2017   HTN (hypertension) 03/13/2012   Hypothyroid 03/13/2012   Lung cancer (HCC)    Shortness of breath    Stroke ([place])    Syncope and collapse 04/02/2016   Type II or unspecified type diabetes mellitus without mention of complication, not stated as uncontrolled 03/13/2012    Medications:  Scheduled:   benzonatate  200 mg Oral TID   diclofenac Sodium  4 g Topical QID   feeding supplement  237 mL Oral BID BM   fluticasone  2 spray Each Nare Daily   gabapentin  300 mg Oral BID   insulin aspart  0-5 Units Subcutaneous QHS   insulin aspart  0-9 Units Subcutaneous TID WC   insulin glargine-yfgn  10 Units Subcutaneous Daily   levothyroxine  88 mcg Oral QAC breakfast   metoprolol succinate  25 mg Oral Daily   multivitamin with minerals  1 tablet Oral Daily   rosuvastatin  20 mg Oral QHS   simethicone  80 mg Oral QID   sodium chloride flush  3 mL Intravenous Q12H     Assessment: [name] is a 89yo female admitted with acure care wirh acute on chronic HF. PMH includes lung carcinoid, severe esophageal dysmotility, permanent atrial fibrillation, moderate diffuse CAD on cath in 2017, hypercholesterolemia, remote stroke, chronic diastolic congestive heart failure, moderate aortic stenosis, asymptomatic right subclavian artery stenosis, hypertension, type 2 diabetes, CKD stage III, sick euthyroid syndrome.  Per discussion with Dr Aundra Dubin (Adv HF) he is having problems diuresing in addition to current hyponatremia(hypervolemic hyponatremia ). Plan to order 1 dose of tolvaptan to improve Na level and re-assess fluid status.  Scr 1.89, Na 126, CrCl 16.9  Goal of Therapy:  Correction SHOULD NOT exceed 24mEq/L per 8hrs or 35mEq in 24hrs  Plan: Tolvaptan 15mg  x 1 dose on 2/13 @ 1207  Serum sodium q8h for the first 24 hours after dose administration, then daily.  Discontinue if serum sodium anytime during the first 24 hours rises >12 mEq/L from baseline.  Notify MD if Na increases by 67mEq/L per 8hrs or more.   2/13:  Na @ 1735 = 127 2/14:  Na @ 0131 = 127   - will recheck Na again on 2/14 @ ~ 1000  [name] D 02/11/2023 1:55 AM

## 2023-02-12 ENCOUNTER — Encounter: Admission: EM | Disposition: E | Payer: Self-pay | Source: Home / Self Care | Attending: Internal Medicine

## 2023-02-12 ENCOUNTER — Encounter: Payer: Medicare Other | Admitting: Family

## 2023-02-12 DIAGNOSIS — J90 Pleural effusion, not elsewhere classified: Secondary | ICD-10-CM

## 2023-02-12 DIAGNOSIS — Z9889 Other specified postprocedural states: Secondary | ICD-10-CM

## 2023-02-12 DIAGNOSIS — I272 Pulmonary hypertension, unspecified: Secondary | ICD-10-CM

## 2023-02-12 DIAGNOSIS — I5033 Acute on chronic diastolic (congestive) heart failure: Secondary | ICD-10-CM | POA: Diagnosis not present

## 2023-02-12 DIAGNOSIS — J810 Acute pulmonary edema: Secondary | ICD-10-CM | POA: Diagnosis not present

## 2023-02-12 DIAGNOSIS — J9621 Acute and chronic respiratory failure with hypoxia: Secondary | ICD-10-CM | POA: Diagnosis not present

## 2023-02-12 DIAGNOSIS — I509 Heart failure, unspecified: Secondary | ICD-10-CM | POA: Diagnosis not present

## 2023-02-12 HISTORY — PX: RIGHT HEART CATH: CATH118263

## 2023-02-12 LAB — POCT I-STAT EG7
Acid-Base Excess: 10 mmol/L — ABNORMAL HIGH (ref 0.0–2.0)
Acid-Base Excess: 9 mmol/L — ABNORMAL HIGH (ref 0.0–2.0)
Bicarbonate: 35.4 mmol/L — ABNORMAL HIGH (ref 20.0–28.0)
Bicarbonate: 34.6 mmol/L — ABNORMAL HIGH (ref 20.0–28.0)
Calcium, Ion: 1.1 mmol/L — ABNORMAL LOW (ref 1.15–1.40)
Calcium, Ion: 1.07 mmol/L — ABNORMAL LOW (ref 1.15–1.40)
HCT: 25 % — ABNORMAL LOW (ref 36.0–46.0)
HCT: 26 % — ABNORMAL LOW (ref 36.0–46.0)
Hemoglobin: 8.5 g/dL — ABNORMAL LOW (ref 12.0–15.0)
Hemoglobin: 8.8 g/dL — ABNORMAL LOW (ref 12.0–15.0)
O2 Saturation: 57 %
O2 Saturation: 56 %
Potassium: 2.8 mmol/L — ABNORMAL LOW (ref 3.5–5.1)
Potassium: 2.8 mmol/L — ABNORMAL LOW (ref 3.5–5.1)
Sodium: 128 mmol/L — ABNORMAL LOW (ref 135–145)
Sodium: 131 mmol/L — ABNORMAL LOW (ref 135–145)
TCO2: 37 mmol/L — ABNORMAL HIGH (ref 22–32)
TCO2: 36 mmol/L — ABNORMAL HIGH (ref 22–32)
pCO2, Ven: 55 mmHg (ref 44–60)
pCO2, Ven: 52.1 mmHg (ref 44–60)
pH, Ven: 7.416 (ref 7.25–7.43)
pH, Ven: 7.43 (ref 7.25–7.43)
pO2, Ven: 30 mmHg — CL (ref 32–45)
pO2, Ven: 29 mmHg — CL (ref 32–45)

## 2023-02-12 LAB — BASIC METABOLIC PANEL
Anion gap: 14 (ref 5–15)
BUN: 83 mg/dL — ABNORMAL HIGH (ref 8–23)
CO2: 33 mmol/L — ABNORMAL HIGH (ref 22–32)
Calcium: 8.5 mg/dL — ABNORMAL LOW (ref 8.9–10.3)
Chloride: 85 mmol/L — ABNORMAL LOW (ref 98–111)
Creatinine, Ser: 2 mg/dL — ABNORMAL HIGH (ref 0.44–1.00)
GFR, Estimated: 24 mL/min — ABNORMAL LOW (ref >60)
Glucose, Bld: 132 mg/dL — ABNORMAL HIGH (ref 70–99)
Potassium: 4.1 mmol/L (ref 3.5–5.1)
Sodium: 132 mmol/L — ABNORMAL LOW (ref 135–145)

## 2023-02-12 LAB — PROCALCITONIN: Procalcitonin: 2.18 ng/mL

## 2023-02-12 LAB — MAGNESIUM: Magnesium: 2.2 mg/dL (ref 1.7–2.4)

## 2023-02-12 LAB — GLUCOSE, CAPILLARY
Glucose-Capillary: 161 mg/dL — ABNORMAL HIGH (ref 70–99)
Glucose-Capillary: 215 mg/dL — ABNORMAL HIGH (ref 70–99)
Glucose-Capillary: 160 mg/dL — ABNORMAL HIGH (ref 70–99)

## 2023-02-12 LAB — CBC
HCT: 26.3 % — ABNORMAL LOW (ref 36.0–46.0)
Hemoglobin: 8.5 g/dL — ABNORMAL LOW (ref 12.0–15.0)
MCH: 27.6 pg (ref 26.0–34.0)
MCHC: 32.3 g/dL (ref 30.0–36.0)
MCV: 85.4 fL (ref 80.0–100.0)
Platelets: 174 10*3/uL (ref 150–400)
RBC: 3.08 MIL/uL — ABNORMAL LOW (ref 3.87–5.11)
RDW: 15.1 % (ref 11.5–15.5)
WBC: 11.3 10*3/uL — ABNORMAL HIGH (ref 4.0–10.5)
nRBC: 0 % (ref 0.0–0.2)

## 2023-02-12 SURGERY — RIGHT HEART CATH
Anesthesia: Moderate Sedation

## 2023-02-12 MED ORDER — INSULIN GLARGINE-YFGN 100 UNIT/ML ~~LOC~~ SOLN
10.0000 [IU] | Freq: Every day | SUBCUTANEOUS | Status: DC
  Administered 2023-02-13 – 2023-02-14 (×2): 10 [IU] via SUBCUTANEOUS
  Filled 2023-02-12 (×3): qty 0.1

## 2023-02-12 MED ORDER — HYDRALAZINE HCL 20 MG/ML IJ SOLN: 10.0000 mg | INTRAMUSCULAR | Status: AC | PRN

## 2023-02-12 MED ORDER — APIXABAN 2.5 MG PO TABS: 2.5000 mg | ORAL_TABLET | Freq: Two times a day (BID) | ORAL | Status: DC

## 2023-02-12 MED ORDER — HEPARIN SODIUM (PORCINE) 5000 UNIT/ML IJ SOLN
5000.0000 [IU] | Freq: Three times a day (TID) | INTRAMUSCULAR | Status: AC
  Administered 2023-02-12 – 2023-02-13 (×2): 5000 [IU] via SUBCUTANEOUS
  Filled 2023-02-12 (×2): qty 1

## 2023-02-12 MED ORDER — SODIUM CHLORIDE 0.9 % IV SOLN: 250.0000 mL | INTRAVENOUS | Status: DC | PRN

## 2023-02-12 MED ORDER — LABETALOL HCL 5 MG/ML IV SOLN: 10.0000 mg | INTRAVENOUS | Status: AC | PRN

## 2023-02-12 MED ORDER — ACETAMINOPHEN 325 MG PO TABS
650.0000 mg | ORAL_TABLET | ORAL | Status: DC | PRN
  Administered 2023-02-13: 650 mg via ORAL
  Filled 2023-02-12: qty 2

## 2023-02-12 MED ORDER — HEPARIN (PORCINE) IN NACL 2000-0.9 UNIT/L-% IV SOLN
INTRAVENOUS | Status: DC | PRN
  Administered 2023-02-12: 1000 mL

## 2023-02-12 MED ORDER — SODIUM CHLORIDE 0.9% FLUSH: 3.0000 mL | INTRAVENOUS | Status: DC | PRN

## 2023-02-12 MED ORDER — ONDANSETRON HCL 4 MG/2ML IJ SOLN
4.0000 mg | Freq: Four times a day (QID) | INTRAMUSCULAR | Status: DC | PRN
  Administered 2023-02-14 – 2023-02-15 (×3): 4 mg via INTRAVENOUS
  Filled 2023-02-12 (×3): qty 2

## 2023-02-12 MED ORDER — SODIUM CHLORIDE 0.9% FLUSH
3.0000 mL | Freq: Two times a day (BID) | INTRAVENOUS | Status: DC
  Administered 2023-02-12 – 2023-02-15 (×4): 3 mL via INTRAVENOUS

## 2023-02-12 MED ORDER — HEPARIN (PORCINE) IN NACL 1000-0.9 UT/500ML-% IV SOLN
INTRAVENOUS | Status: AC
  Filled 2023-02-12: qty 500

## 2023-02-12 SURGICAL SUPPLY — 7 items
CATH BALLN WEDGE 5F 110CM (CATHETERS) IMPLANT
DRAPE BRACHIAL (DRAPES) IMPLANT
PACK CARDIAC CATH (CUSTOM PROCEDURE TRAY) IMPLANT
PROTECTION STATION PRESSURIZED (MISCELLANEOUS) ×1
SET ATX SIMPLICITY (MISCELLANEOUS) IMPLANT
SHEATH GLIDE SLENDER 4/5FR (SHEATH) IMPLANT
STATION PROTECTION PRESSURIZED (MISCELLANEOUS) IMPLANT

## 2023-02-12 NOTE — Progress Notes (Signed)
Report given to John, RN

## 2023-02-12 NOTE — H&P (View-Only) (Signed)
Patient ID: [name], female   DOB: 07-09-1934, 89 y.o.   MRN: [id number]     Advanced Heart Failure Rounding Note  PCP-Cardiologist: None   Subjective:    She is on HFNC this morning, off Bipap.  Had right thoracentesis yesterday, 1 L fluid off.   Creatinine higher today 1.85 => 2.0.  She appears to have diuresed better with Lasix gtt + metolazone, weight down.   HR remains around 110 in atrial fibrillation on amiodarone gtt 30 mg/hr.    Echo: EF 65-70%, moderate LVH, moderate RVE with normal systolic function, PASP 50, mild MR, mild-moderate TR, possible paradoxical low gradient severe AS with mean gradient 25 and AVA 0.59 cm^2.    Objective:   Weight Range: 54 kg Body mass index is 21.77 kg/m.   Vital Signs:   Temp:  [97.3 F (36.3 C)-99 F (37.2 C)] 97.3 F (36.3 C) (02/15 0400) Pulse Rate:  [101-126] 106 (02/15 0740) Resp:  [18-32] 20 (02/15 0740) BP: (83-112)/(46-75) 101/64 (02/15 0700) SpO2:  [90 %-100 %] 100 % (02/15 0740) FiO2 (%):  [50 %-80 %] 50 % (02/15 0740) Weight:  [54 kg-58.9 kg] 54 kg (02/15 0500) Last BM Date : 02/11/23  Weight change: Filed Weights   02/10/23 0517 02/11/23 1100 02/11/2023 0500  Weight: 57.7 kg 58.9 kg 54 kg    Intake/Output:   Intake/Output Summary (Last 24 hours) at 02/16/2023 0754 Last data filed at 01/30/2023 0431 Gross per 24 hour  Intake 600.75 ml  Output 1825 ml  Net -1224.25 ml      Physical Exam    General: Thin/frail Neck: No JVD, no thyromegaly or thyroid nodule.  Lungs: Deceased BS at bases.  CV: Nondisplaced PMI.  Heart mildly tachy, irregular S1/S2, no S3/S4, 2/6 SEM RUSB.  No peripheral edema.   Abdomen: Soft, nontender, no hepatosplenomegaly, no distention.  Skin: Intact without lesions or rashes.  Neurologic: Alert and oriented x 3.  Psych: Normal affect. Extremities: No clubbing or cyanosis.  HEENT: Normal.   Telemetry   Atrial fibrillation rate around 110, personally reviewed  Labs     CBC Recent Labs    02/11/23 0958 02/11/2023 0410  WBC 10.1 11.3*  HGB 8.3* 8.5*  HCT 25.0* 26.3*  MCV 84.7 85.4  PLT 115* 951   Basic Metabolic Panel Recent Labs    02/11/23 0307 02/11/23 0958 02/20/2023 0410  NA 129* 129* 132*  K 4.1  --  4.1  CL 85*  --  85*  CO2 31  --  33*  GLUCOSE 215*  --  132*  BUN 77*  --  83*  CREATININE 1.85*  --  2.00*  CALCIUM 8.5*  --  8.5*  MG 2.3  --  2.2   Liver Function Tests Recent Labs    02/10/23 1000  AST 22  ALT 15  ALKPHOS 65  BILITOT 1.2  PROT 6.1*  ALBUMIN 2.7*   No results for input(s): "LIPASE", "AMYLASE" in the last 72 hours. Cardiac Enzymes No results for input(s): "CKTOTAL", "CKMB", "CKMBINDEX", "TROPONINI" in the last 72 hours.  BNP: BNP (last 3 results) Recent Labs    12/01/22 1503 02/19/2023 1316 02/08/23 1016  BNP 228.0* 335.2* 293.7*    ProBNP (last 3 results) Recent Labs    01/12/23 1127  PROBNP 2,095*     D-Dimer No results for input(s): "DDIMER" in the last 72 hours.  Hemoglobin A1C No results for input(s): "HGBA1C" in the last 72 hours. Fasting Lipid Panel No  results for input(s): "CHOL", "HDL", "LDLCALC", "TRIG", "CHOLHDL", "LDLDIRECT" in the last 72 hours. Thyroid Function Tests No results for input(s): "TSH", "T4TOTAL", "T3FREE", "THYROIDAB" in the last 72 hours.  Invalid input(s): "FREET3"  Other results:   Imaging    DG Chest Port 1 View  Result Date: 02/11/2023 CLINICAL DATA:  Pleural effusion post thoracentesis. EXAM: PORTABLE CHEST 1 VIEW COMPARISON:  February 07, 2023. FINDINGS: Reduction in pleural fluid in the RIGHT chest. Moderately large LEFT pleural effusion may be slightly larger than on the previous radiograph. Trachea midline. Cardiomediastinal contours and hilar structures with persistent cardiac enlargement. EKG leads project over the chest. Interstitial and alveolar opacities persist with improved aeration at the RIGHT lung base. No sign of pneumothorax. On limited  assessment no acute skeletal findings. IMPRESSION: No pneumothorax post thoracentesis. Enlarging LEFT effusion. Persistent bilateral interstitial and airspace disease. Findings could reflect a combination of edema and multifocal infection. Electronically Signed   By: Zetta Bills M.D.   On: 02/11/2023 15:11   US THORACENTESIS ASP PLEURAL SPACE W/IMG GUIDE  Result Date: 02/11/2023 INDICATION: Patient with history of stage IV neuroendocrine tumor, AFib on Eliquis who presented complaining of shortness of breath. Patient found to have acute decompensated diastolic heart failure with persistent pulmonary edema. Attempt to aggressively diuresed patient was unsuccessful and respiratory status continues to decline. Patient placed on BiPAP, transferred to ICU and request received to perform diagnostic and therapeutic thoracentesis for pleural effusion. EXAM: ULTRASOUND GUIDED DIAGNOSTIC AND THERAPEUTIC RIGHT THORACENTESIS MEDICATIONS: 10 mL 1 % lidocaine COMPLICATIONS: None immediate. PROCEDURE: An ultrasound guided thoracentesis was thoroughly discussed with the patient's son and questions answered. The benefits, risks, alternatives and complications were also discussed. The patient understands and wishes to proceed with the procedure. Written consent was obtained. Ultrasound was performed to localize and mark an adequate pocket of fluid in the right chest. The area was then prepped and draped in the normal sterile fashion. 1% Lidocaine was used for local anesthesia. Under ultrasound guidance a 6 Fr Safe-T-Centesis catheter was introduced. Thoracentesis was performed. The catheter was removed and a dressing applied. FINDINGS: A total of approximately 1 L of clear, amber fluid was removed. Samples were sent to the laboratory as requested by the clinical team. IMPRESSION: Successful ultrasound guided right thoracentesis yielding 1 L of pleural fluid. Read by: Narda Rutherford, AGNP-BC Electronically Signed   By: Albin Felling M.D.   On: 02/11/2023 15:03     Medications:     Scheduled Medications:  benzonatate  200 mg Oral TID   Chlorhexidine Gluconate Cloth  6 each Topical Daily   diclofenac Sodium  4 g Topical QID   feeding supplement  237 mL Oral BID BM   fluticasone  2 spray Each Nare Daily   gabapentin  300 mg Oral BID   insulin aspart  0-5 Units Subcutaneous QHS   insulin aspart  0-9 Units Subcutaneous TID WC   insulin glargine-yfgn  10 Units Subcutaneous Daily   levothyroxine  88 mcg Oral QAC breakfast   metoprolol succinate  25 mg Oral Daily   multivitamin with minerals  1 tablet Oral Daily   mouth rinse  15 mL Mouth Rinse 4 times per day   rosuvastatin  20 mg Oral QHS   simethicone  80 mg Oral QID   sodium chloride flush  3 mL Intravenous Q12H    Infusions:  sodium chloride     sodium chloride 10 mL/hr at 02/02/2023 0400   amiodarone 30 mg/hr (02/13/2023  0400)    PRN Medications: sodium chloride, guaiFENesin-dextromethorphan, ipratropium-albuterol, metoprolol tartrate, morphine injection, mouth rinse, polyethylene glycol, senna-docusate, sodium chloride flush, traMADol   Assessment/Plan   1. Acute on chronic diastolic CHF: In setting of permanent atrial fibrillation.  Echo this admission with EF 65-70%, moderate LVH, moderate RVE with normal systolic function, PASP 50, mild MR, mild-moderate TR, possible paradoxical low gradient severe AS with mean gradient 25 and AVA 0.59 cm^2.   Diuresis has been limited by cardiorenal syndrome with rising creatinine.  CT chest showed ground glass infiltrates and pleural effusions suggestive of CHF.  She has a large, partially loculated right pleural effusion and moderate effusion on left. Now s/p right thoracentesis.  She diuresed well yesterday and weight down, volume status does look better on exam this morning though she remains on HFNC.  Creatinine up to 2.  - Hold Lasix gtt for now with rising creatinine and decreased weight pending RHC results.   - Plan for RHC today to assess filling pressure/PA pressure, discussed risks/benefits with patient and she agrees to procedure. - She is very frail, now DNR/DNI.  I fear that we are going to have difficulty improving her situation with worsening renal dysfunction.  2. Hypervolemic hyponatremia: She had tolvaptan 2/13 with Na up to 132.   - Fluid restrict 1500 cc.  3. AKI on CKD stage 3b: Cardiorenal syndrome.  This is limiting our ability to diurese her. Creatinine higher at 2.0 today.  - As above, holding lasix gtt pending RHC.  4. Pleural effusions: CT chest with partially loculated effusions, large on right and moderate on left.  Suspect related to chronic CHF. Now s/p right thoracentesis.  5. Atrial fibrillation: Permanent.  HR around 110 now.   - We are using amiodarone gtt 30 mg/hr for now just for rate control.  She has been off amiodarone due to thyroid abnormalities but ok to use transiently if needed.  - Eliquis held for RHC this morning then restart.  6. CAD: Nonobstructive on 2017 cath.  7. Aortic stenosis: Suspect paradoxical low flow/low gradient severe AS on echo this admission.  This likely worsens her CHF.  Currently too unstable for TAVR consideration.  8. ID: Mildly elevated WBCs at 11.3, still with significant oxygen requirement despite diuresis.   - ?PNA, will send procalcitonin.    Guarded prognosis, DNR/DNI and very frail with cardiorenal syndrome and recalcitrant diastolic CHF.  Agree with goals of care discussions.  Length of Stay: 9  Loralie Champagne, MD  02/22/2023, 7:54 AM  Advanced Heart Failure Team Pager [phone] (M-F; 7a - 5p)  Please contact [facility] for night-coverage after hours (5p -7a ) and weekends on [email or web address]

## 2023-02-12 NOTE — Progress Notes (Addendum)
Patient ID: [name], female   DOB: 26-Dec-1934, 89 y.o.   MRN: [id number]     Advanced Heart Failure Rounding Note  PCP-Cardiologist: None   Subjective:    She is on HFNC this morning, off Bipap.  Had right thoracentesis yesterday, 1 L fluid off.   Creatinine higher today 1.85 => 2.0.  She appears to have diuresed better with Lasix gtt + metolazone, weight down.   HR remains around 110 in atrial fibrillation on amiodarone gtt 30 mg/hr.    Echo: EF 65-70%, moderate LVH, moderate RVE with normal systolic function, PASP 50, mild MR, mild-moderate TR, possible paradoxical low gradient severe AS with mean gradient 25 and AVA 0.59 cm^2.    Objective:   Weight Range: 54 kg Body mass index is 21.77 kg/m.   Vital Signs:   Temp:  [97.3 F (36.3 C)-99 F (37.2 C)] 97.3 F (36.3 C) (02/15 0400) Pulse Rate:  [101-126] 106 (02/15 0740) Resp:  [18-32] 20 (02/15 0740) BP: (83-112)/(46-75) 101/64 (02/15 0700) SpO2:  [90 %-100 %] 100 % (02/15 0740) FiO2 (%):  [50 %-80 %] 50 % (02/15 0740) Weight:  [54 kg-58.9 kg] 54 kg (02/15 0500) Last BM Date : 02/11/23  Weight change: Filed Weights   02/10/23 0517 02/11/23 1100 02/05/2023 0500  Weight: 57.7 kg 58.9 kg 54 kg    Intake/Output:   Intake/Output Summary (Last 24 hours) at 02/08/2023 0754 Last data filed at 02/09/2023 0431 Gross per 24 hour  Intake 600.75 ml  Output 1825 ml  Net -1224.25 ml      Physical Exam    General: Thin/frail Neck: No JVD, no thyromegaly or thyroid nodule.  Lungs: Deceased BS at bases.  CV: Nondisplaced PMI.  Heart mildly tachy, irregular S1/S2, no S3/S4, 2/6 SEM RUSB.  No peripheral edema.   Abdomen: Soft, nontender, no hepatosplenomegaly, no distention.  Skin: Intact without lesions or rashes.  Neurologic: Alert and oriented x 3.  Psych: Normal affect. Extremities: No clubbing or cyanosis.  HEENT: Normal.   Telemetry   Atrial fibrillation rate around 110, personally reviewed  Labs     CBC Recent Labs    02/11/23 0958 02/13/2023 0410  WBC 10.1 11.3*  HGB 8.3* 8.5*  HCT 25.0* 26.3*  MCV 84.7 85.4  PLT 115* 263   Basic Metabolic Panel Recent Labs    02/11/23 0307 02/11/23 0958 02/18/2023 0410  NA 129* 129* 132*  K 4.1  --  4.1  CL 85*  --  85*  CO2 31  --  33*  GLUCOSE 215*  --  132*  BUN 77*  --  83*  CREATININE 1.85*  --  2.00*  CALCIUM 8.5*  --  8.5*  MG 2.3  --  2.2   Liver Function Tests Recent Labs    02/10/23 1000  AST 22  ALT 15  ALKPHOS 65  BILITOT 1.2  PROT 6.1*  ALBUMIN 2.7*   No results for input(s): "LIPASE", "AMYLASE" in the last 72 hours. Cardiac Enzymes No results for input(s): "CKTOTAL", "CKMB", "CKMBINDEX", "TROPONINI" in the last 72 hours.  BNP: BNP (last 3 results) Recent Labs    12/01/22 1503 02/23/2023 1316 02/08/23 1016  BNP 228.0* 335.2* 293.7*    ProBNP (last 3 results) Recent Labs    01/12/23 1127  PROBNP 2,095*     D-Dimer No results for input(s): "DDIMER" in the last 72 hours.  Hemoglobin A1C No results for input(s): "HGBA1C" in the last 72 hours. Fasting Lipid Panel No  results for input(s): "CHOL", "HDL", "LDLCALC", "TRIG", "CHOLHDL", "LDLDIRECT" in the last 72 hours. Thyroid Function Tests No results for input(s): "TSH", "T4TOTAL", "T3FREE", "THYROIDAB" in the last 72 hours.  Invalid input(s): "FREET3"  Other results:   Imaging    DG Chest Port 1 View  Result Date: 02/11/2023 CLINICAL DATA:  Pleural effusion post thoracentesis. EXAM: PORTABLE CHEST 1 VIEW COMPARISON:  February 07, 2023. FINDINGS: Reduction in pleural fluid in the RIGHT chest. Moderately large LEFT pleural effusion may be slightly larger than on the previous radiograph. Trachea midline. Cardiomediastinal contours and hilar structures with persistent cardiac enlargement. EKG leads project over the chest. Interstitial and alveolar opacities persist with improved aeration at the RIGHT lung base. No sign of pneumothorax. On limited  assessment no acute skeletal findings. IMPRESSION: No pneumothorax post thoracentesis. Enlarging LEFT effusion. Persistent bilateral interstitial and airspace disease. Findings could reflect a combination of edema and multifocal infection. Electronically Signed   By: Zetta Bills M.D.   On: 02/11/2023 15:11   US THORACENTESIS ASP PLEURAL SPACE W/IMG GUIDE  Result Date: 02/11/2023 INDICATION: Patient with history of stage IV neuroendocrine tumor, AFib on Eliquis who presented complaining of shortness of breath. Patient found to have acute decompensated diastolic heart failure with persistent pulmonary edema. Attempt to aggressively diuresed patient was unsuccessful and respiratory status continues to decline. Patient placed on BiPAP, transferred to ICU and request received to perform diagnostic and therapeutic thoracentesis for pleural effusion. EXAM: ULTRASOUND GUIDED DIAGNOSTIC AND THERAPEUTIC RIGHT THORACENTESIS MEDICATIONS: 10 mL 1 % lidocaine COMPLICATIONS: None immediate. PROCEDURE: An ultrasound guided thoracentesis was thoroughly discussed with the patient's son and questions answered. The benefits, risks, alternatives and complications were also discussed. The patient understands and wishes to proceed with the procedure. Written consent was obtained. Ultrasound was performed to localize and mark an adequate pocket of fluid in the right chest. The area was then prepped and draped in the normal sterile fashion. 1% Lidocaine was used for local anesthesia. Under ultrasound guidance a 6 Fr Safe-T-Centesis catheter was introduced. Thoracentesis was performed. The catheter was removed and a dressing applied. FINDINGS: A total of approximately 1 L of clear, amber fluid was removed. Samples were sent to the laboratory as requested by the clinical team. IMPRESSION: Successful ultrasound guided right thoracentesis yielding 1 L of pleural fluid. Read by: Narda Rutherford, AGNP-BC Electronically Signed   By: Albin Felling M.D.   On: 02/11/2023 15:03     Medications:     Scheduled Medications:  benzonatate  200 mg Oral TID   Chlorhexidine Gluconate Cloth  6 each Topical Daily   diclofenac Sodium  4 g Topical QID   feeding supplement  237 mL Oral BID BM   fluticasone  2 spray Each Nare Daily   gabapentin  300 mg Oral BID   insulin aspart  0-5 Units Subcutaneous QHS   insulin aspart  0-9 Units Subcutaneous TID WC   insulin glargine-yfgn  10 Units Subcutaneous Daily   levothyroxine  88 mcg Oral QAC breakfast   metoprolol succinate  25 mg Oral Daily   multivitamin with minerals  1 tablet Oral Daily   mouth rinse  15 mL Mouth Rinse 4 times per day   rosuvastatin  20 mg Oral QHS   simethicone  80 mg Oral QID   sodium chloride flush  3 mL Intravenous Q12H    Infusions:  sodium chloride     sodium chloride 10 mL/hr at 02/05/2023 0400   amiodarone 30 mg/hr (02/07/2023  0400)    PRN Medications: sodium chloride, guaiFENesin-dextromethorphan, ipratropium-albuterol, metoprolol tartrate, morphine injection, mouth rinse, polyethylene glycol, senna-docusate, sodium chloride flush, traMADol   Assessment/Plan   1. Acute on chronic diastolic CHF: In setting of permanent atrial fibrillation.  Echo this admission with EF 65-70%, moderate LVH, moderate RVE with normal systolic function, PASP 50, mild MR, mild-moderate TR, possible paradoxical low gradient severe AS with mean gradient 25 and AVA 0.59 cm^2.   Diuresis has been limited by cardiorenal syndrome with rising creatinine.  CT chest showed ground glass infiltrates and pleural effusions suggestive of CHF.  She has a large, partially loculated right pleural effusion and moderate effusion on left. Now s/p right thoracentesis.  She diuresed well yesterday and weight down, volume status does look better on exam this morning though she remains on HFNC.  Creatinine up to 2.  - Hold Lasix gtt for now with rising creatinine and decreased weight pending RHC results.   - Plan for RHC today to assess filling pressure/PA pressure, discussed risks/benefits with patient and she agrees to procedure. - She is very frail, now DNR/DNI.  I fear that we are going to have difficulty improving her situation with worsening renal dysfunction.  2. Hypervolemic hyponatremia: She had tolvaptan 2/13 with Na up to 132.   - Fluid restrict 1500 cc.  3. AKI on CKD stage 3b: Cardiorenal syndrome.  This is limiting our ability to diurese her. Creatinine higher at 2.0 today.  - As above, holding lasix gtt pending RHC.  4. Pleural effusions: CT chest with partially loculated effusions, large on right and moderate on left.  Suspect related to chronic CHF. Now s/p right thoracentesis.  5. Atrial fibrillation: Permanent.  HR around 110 now.   - We are using amiodarone gtt 30 mg/hr for now just for rate control.  She has been off amiodarone due to thyroid abnormalities but ok to use transiently if needed.  - Eliquis held for RHC this morning then restart.  6. CAD: Nonobstructive on 2017 cath.  7. Aortic stenosis: Suspect paradoxical low flow/low gradient severe AS on echo this admission.  This likely worsens her CHF.  Currently too unstable for TAVR consideration.  8. ID: Mildly elevated WBCs at 11.3, still with significant oxygen requirement despite diuresis.   - ?PNA, will send procalcitonin.    Guarded prognosis, DNR/DNI and very frail with cardiorenal syndrome and recalcitrant diastolic CHF.  Agree with goals of care discussions.  Length of Stay: 9  Loralie Champagne, MD  01/31/2023, 7:54 AM  Advanced Heart Failure Team Pager [phone] (M-F; 7a - 5p)  Please contact [facility] for night-coverage after hours (5p -7a ) and weekends on [email or web address]   RHC shows normal filling pressures, mild-moderate PAH (likely group 3).  Would hold off on restarting IV Lasix today, will likely start po tomorrow if creatinine is stable/decreased.   Loralie Champagne 02/01/2023

## 2023-02-12 NOTE — Progress Notes (Signed)
ANTICOAGULATION CONSULT NOTE - Initial Consult  Pharmacy Consult for apixaban Indication: atrial fibrillation  Allergies  Allergen Reactions   Codeine Nausea And Vomiting   Hydrocodone-Acetaminophen Nausea And Vomiting    Patient Measurements: Height: 5\' 2"  (157.5 cm) Weight: 54 kg (119 lb 0.8 oz) IBW/kg (Calculated) : 50.1  Vital Signs: Temp: 97.7 F (36.5 C) (02/15 1051) Temp Source: Oral (02/15 1051) BP: 97/42 (02/15 1230) Pulse Rate: 107 (02/15 1329)  Labs: Recent Labs    02/10/23 1000 02/11/23 0307 02/11/23 0958 02/11/23 0958 02/01/2023 0410 02/08/2023 1155  HGB  --   --  8.3*   < > 8.5* 8.5*  HCT  --   --  25.0*  --  26.3* 25.0*  PLT  --   --  115*  --  174  --   CREATININE 1.85* 1.85*  --   --  2.00*  --    < > = values in this interval not displayed.    Estimated Creatinine Clearance: 16 mL/min (A) (by C-G formula based on SCr of 2 mg/dL (H)).   Medical History: Past Medical History:  Diagnosis Date   Acute diverticulitis 06/2017   Anemia 06/2017   CAD (coronary artery disease) 03/13/2012   Diabetes mellitus    Dysrhythmia    ATRIAL FIB   Fall 07/07/2017   HTN (hypertension) 03/13/2012   Hypothyroid 03/13/2012   Lung cancer (HCC)    Shortness of breath    Stroke ([place])    Syncope and collapse 04/02/2016   Type II or unspecified type diabetes mellitus without mention of complication, not stated as uncontrolled 03/13/2012    Medications:  Aipxaban 2.5 mg BID   Assessment: 89 year old female admitted with acute on chronic diastolic CHF and bilateral pleural effusion. Now s/p R heart cath. CHADSVasc at least 5. Restarting apixaban now that   Goal of Therapy:  Monitor platelets by anticoagulation protocol: Yes   Plan:  Apixaban 2.5 mg BID starting at 2200.    Wynelle Cleveland 02/11/2023,2:06 PM

## 2023-02-12 NOTE — Progress Notes (Signed)
OT Cancellation Note  Patient Details Name: [name] MRN: [id number] DOB: [birth year]   Cancelled Treatment:    Reason Eval/Treat Not Completed: Patient at procedure or test/ unavailable. Pt currently at cath lab and discussions continue regarding goals of care. OT to re-attempt when pt is next available.   Darleen Crocker, MS, OTR/L , CBIS ascom [phone]  02/05/2023, 12:38 PM

## 2023-02-12 NOTE — Progress Notes (Signed)
[facility] for Apixaban Indication: atrial fibrillation  Allergies  Allergen Reactions   Codeine Nausea And Vomiting   Hydrocodone-Acetaminophen Nausea And Vomiting    Patient Measurements: Height: 5\' 2"  (157.5 cm) Weight: 54 kg (119 lb 0.8 oz) IBW/kg (Calculated) : 50.1  Vital Signs: Temp: 97.7 F (36.5 C) (02/15 1051) Temp Source: Oral (02/15 1051) BP: 138/79 (02/15 1500) Pulse Rate: 115 (02/15 1500)  Labs: Recent Labs    02/10/23 1000 02/11/23 0307 02/11/23 0958 02/11/23 0958 02/21/2023 0410 02/08/2023 1155  HGB  --   --  8.3*   < > 8.5* 8.8*  8.5*  HCT  --   --  25.0*  --  26.3* 26.0*  25.0*  PLT  --   --  115*  --  174  --   CREATININE 1.85* 1.85*  --   --  2.00*  --    < > = values in this interval not displayed.     Estimated Creatinine Clearance: 16 mL/min (A) (by C-G formula based on SCr of 2 mg/dL (H)).   Medical History: Past Medical History:  Diagnosis Date   Acute diverticulitis 06/2017   Anemia 06/2017   CAD (coronary artery disease) 03/13/2012   Diabetes mellitus    Dysrhythmia    ATRIAL FIB   Fall 07/07/2017   HTN (hypertension) 03/13/2012   Hypothyroid 03/13/2012   Lung cancer (HCC)    Shortness of breath    Stroke ([place])    Syncope and collapse 04/02/2016   Type II or unspecified type diabetes mellitus without mention of complication, not stated as uncontrolled 03/13/2012    Medications:  Scheduled:   benzonatate  200 mg Oral TID   Chlorhexidine Gluconate Cloth  6 each Topical Daily   diclofenac Sodium  4 g Topical QID   feeding supplement  237 mL Oral BID BM   fluticasone  2 spray Each Nare Daily   gabapentin  300 mg Oral BID   heparin injection (subcutaneous)  5,000 Units Subcutaneous Q8H   insulin aspart  0-5 Units Subcutaneous QHS   insulin aspart  0-9 Units Subcutaneous TID WC   [START ON 02/13/2023] insulin glargine-yfgn  10 Units Subcutaneous Daily   levothyroxine  88 mcg Oral QAC  breakfast   metoprolol succinate  25 mg Oral Daily   multivitamin with minerals  1 tablet Oral Daily   mouth rinse  15 mL Mouth Rinse 4 times per day   rosuvastatin  20 mg Oral QHS   simethicone  80 mg Oral QID   sodium chloride flush  3 mL Intravenous Q12H   sodium chloride flush  3 mL Intravenous Q12H   Infusions:   sodium chloride     amiodarone 30 mg/hr (02/10/2023 0947)   PRN: sodium chloride, acetaminophen, guaiFENesin-dextromethorphan, hydrALAZINE, ipratropium-albuterol, labetalol, metoprolol tartrate, morphine injection, ondansetron (ZOFRAN) IV, mouth rinse, polyethylene glycol, senna-docusate, sodium chloride flush, traMADol  Assessment: WILHELMENIA ADDIS is a 89 y.o. female presenting with increased shortness of breath for the last 2-3 days. PMH significant for AF on Eliquis, mild pulmonary HTN, CKD3b, T2DM, dCHF. Patient was on [facility] PTA per chart review. Last dose of apixaban 2.5 mg 02/17/2023 AM. Patient found to have acute on chronic dCHF and bilateral pleural effusion. She underwent RHC 2/15. CHADSVasc at least 5. Pharmacy has been consulted to dose apixaban.  Addendum: Thoracentesis originally scheduled for 2/15 rescheduled to 2/16. Plan to resume Eliquis after thoracentesis tomorrow. Will give [facility] for DVT ppx until  thoracentesis.   Goal of Therapy:  Monitor platelets by anticoagulation protocol: Yes   Plan:  Eliquis has been discontinued in favor of SQH prior to thoracentesis Plan to resume apixaban after thoracentesis as directed by MD  Gretel Acre, PharmD PGY1 Pharmacy Resident 02/13/2023 4:43 PM

## 2023-02-12 NOTE — Interval H&P Note (Signed)
History and Physical Interval Note:  02/17/2023 11:41 AM  [name]  has presented today for surgery, with the diagnosis of chf.  The various methods of treatment have been discussed with the patient and family. After consideration of risks, benefits and other options for treatment, the patient has consented to  Procedure(s): RIGHT HEART CATH (N/A) as a surgical intervention.  The patient's history has been reviewed, patient examined, no change in status, stable for surgery.  I have reviewed the patient's chart and labs.  Questions were answered to the patient's satisfaction.     [name]

## 2023-02-12 NOTE — Progress Notes (Addendum)
Progress Note    [name]  IOX:[id number] DOB: [birth year]  DOA: 02/14/2023 PCP: [name], FNP      Brief Narrative:    Medical records reviewed and are as summarized below:  [name] is a 89 y.o. female with past medical history significant for hypertension, hyperlipidemia, atrial fibrillation on Eliquis, CAD, history of a stroke, mild pulmonary hypertension, stage IV neuroendocrine tumor favor carcinoid, on chornic who presents emergency department for chief concerns of shortness of breath.   Presentation consistent with acute decompensated diastolic heart failure.  Fluid status has been difficult to manage.  Patient has been aggressively diuresed however repeat chest x-ray with persistent pulmonary edema pattern.   2/11: Respiratory status continues to decline.  This morning requiring 15 L high flow nasal cannula with saturations in the low to mid 80s.  Shortness of breath persistent.  Initiated on BiPAP.  Cardiology consulted.   2/12: Shortness of breath persistent.  Patient currently on 10 L high flow nasal cannula.  Required BiPAP for several hours yesterday and then again overnight.  Volume status difficult to ascertain.  Attempting to require a chest CT without contrast.  If pleural effusions patient may require thoracentesis.  Palliative care consult appreciated.  [name]   2/13: Respiratory status remains quite tenuous.  Diuresis limited by cardiorenal syndrome worsening creatinine and hyponatremia.  Heart failure service consulted.  Started on Lasix gtt.     Assessment/Plan:   Principal Problem:   Acute on chronic hypoxic respiratory failure (HCC) Active Problems:   Acute exacerbation of CHF (congestive heart failure) (HCC)   Acute respiratory failure with hypoxia (HCC)   CAD (coronary artery disease)   Insulin dependent type 2 diabetes mellitus (HCC)   Stage 3b  chronic kidney disease (CKD) (HCC)   Carcinoid tumor of lung   HTN (hypertension)   Hypothyroid   Physical deconditioning   Protein-calorie malnutrition, severe   Atrial fibrillation, chronic (HCC)   Hyperlipidemia   Dysuria   Protein-calorie malnutrition, moderate (HCC)   Bilateral pleural effusion   Nutrition Problem: Moderate Malnutrition Etiology: chronic illness (CHF)  Signs/Symptoms: moderate fat depletion, mild fat depletion, mild muscle depletion, moderate muscle depletion   Body mass index is 21.77 kg/m.   Acute on chronic diastolic CHF: S/p right heart cath on 02/10/2023.  No significant abnormalities noted. IV Lasix has been discontinued by cardiologist.  Monitor BMP, daily weight and urine output.  2D echo showed EF estimated at 65 to 70%, moderate LVH, PASP 50, mild MR, mild to moderate TR, severe aortic stenosis   Bilateral pleural effusion: S/p right-sided thoracentesis on 02/11/2023 with removal of 1 L of pleural fluid.  Left-sided thoracentesis has been ordered today.   Acute on chronic hypoxic respiratory failure: He is on 15 L/min oxygen via heated humidified HFNC.  Taper down oxygen as able.   Permanent atrial fibrillation with RVR: She is on IV amiodarone drip.  Continue metoprolol.  Eliquis has been held for thoracentesis.   Hypotension: Monitor BP closely   Hypervolemic hyponatremia: S/p tolvaptan on 02/10/2023.  Monitor sodium level closely.  Continue fluid restriction 1500 mL.   AKI on CKD stage IIIb, cardiorenal syndrome: Creatinine is trending upward.  IV Lasix has been discontinued.  Monitor BMP   Type II DM: Continue insulin glargine and NovoLog.  Of note glargine held on the morning of 02/06/2023 because of n.p.o. status for right  heart cath.   CAD, aortic stenosis: Not a good candidate for TAVR.  Per cardiologist.   Dysuria: Completed 3 days of IV Rocephin.   Other comorbidities include history of stroke, severe protein calorie  malnutrition, hypothyroidism, hypertension   Diet Order             Diet clear liquid Room service appropriate? Yes; Fluid consistency: Thin  Diet effective now                            Consultants: Cardiologist Palliative care  Procedures: None    Medications:    apixaban  2.5 mg Oral BID   [MAR Hold] benzonatate  200 mg Oral TID   [MAR Hold] Chlorhexidine Gluconate Cloth  6 each Topical Daily   [MAR Hold] diclofenac Sodium  4 g Topical QID   [MAR Hold] feeding supplement  237 mL Oral BID BM   [MAR Hold] fluticasone  2 spray Each Nare Daily   [MAR Hold] gabapentin  300 mg Oral BID   [MAR Hold] insulin aspart  0-5 Units Subcutaneous QHS   [MAR Hold] insulin aspart  0-9 Units Subcutaneous TID WC   [MAR Hold] insulin glargine-yfgn  10 Units Subcutaneous Daily   [MAR Hold] levothyroxine  88 mcg Oral QAC breakfast   [MAR Hold] metoprolol succinate  25 mg Oral Daily   [MAR Hold] multivitamin with minerals  1 tablet Oral Daily   [MAR Hold] mouth rinse  15 mL Mouth Rinse 4 times per day   [MAR Hold] rosuvastatin  20 mg Oral QHS   [MAR Hold] simethicone  80 mg Oral QID   [MAR Hold] sodium chloride flush  3 mL Intravenous Q12H   sodium chloride flush  3 mL Intravenous Q12H   Continuous Infusions:  sodium chloride     sodium chloride 10 mL/hr at 01/31/2023 0827   sodium chloride     amiodarone 30 mg/hr (02/25/2023 0947)     Anti-infectives (From admission, onward)    Start     Dose/Rate Route Frequency Ordered Stop   02/04/23 1500  cefTRIAXone (ROCEPHIN) 1 g in sodium chloride 0.9 % 100 mL IVPB        1 g 200 mL/hr over 30 Minutes Intravenous Every 24 hours 02/04/23 1425 02/06/23 1509              Family Communication/Anticipated D/C date and plan/Code Status   DVT prophylaxis: apixaban (ELIQUIS) tablet 2.5 mg Start: 02/24/2023 2200 SCD's Start: 02/09/2023 1209 Place TED hose Start: 02/16/2023 2033 apixaban (ELIQUIS) tablet 2.5 mg     Code Status:  DNR  Family Communication: Remo Lipps, son, at the bedside Disposition Plan: Plan to discharge to SNF when medically stable   Status is: Inpatient Remains inpatient appropriate because: CHF exacerbation      Subjective:   Interval events noted.  She feels better today she is more awake today.  Remo Lipps, son, was at the bedside  Objective:    Vitals:   02/11/2023 1155 02/17/2023 1208 02/11/2023 1215 02/20/2023 1230  BP: 100/63 (!) 101/57 (!) 102/56 (!) 97/42  Pulse: (!) 115 (!) 117 (!) 110 (!) 101  Resp: (!) 21 (!) 22 18 (!) 21  Temp:      TempSrc:      SpO2: 98% 96% 99% 98%  Weight:      Height:       No data found.   Intake/Output Summary (Last 24 hours) at 02/21/2023 1255  Last data filed at 02/04/2023 0827 Gross per 24 hour  Intake 652.61 ml  Output 1100 ml  Net -447.39 ml   Filed Weights   02/10/23 0517 02/11/23 1100 02/10/2023 0500  Weight: 57.7 kg 58.9 kg 54 kg    Exam:  GEN: NAD, on oxygen via heated humidified HFNC SKIN: Warm and dry EYES: EOMI ENT: MMM CV: Irregular rate and rhythm, mildly tachycardic PULM: Bilateral rhonchi, no rales. ABD: soft, ND, NT, +BS CNS: AAO x 3, non focal EXT: No edema or tenderness       Data Reviewed:   I have personally reviewed following labs and imaging studies:  Labs: Labs show the following:   Basic Metabolic Panel: Recent Labs  Lab 02/08/23 1016 02/09/23 0748 02/10/23 0621 02/10/23 1000 02/10/23 1735 02/11/23 0131 02/11/23 0307 02/11/23 0958 02/19/2023 0410  NA 131* 129* 126* 126* 127* 127* 129* 129* 132*  K 3.3* 3.1* 5.0 4.9  --   --  4.1  --  4.1  CL 92* 89* 88* 88*  --   --  85*  --  85*  CO2 28 29 28 28   --   --  31  --  33*  GLUCOSE 184* 200* 253* 219*  --   --  215*  --  132*  BUN 44* 50* 65* 65*  --   --  77*  --  83*  CREATININE 1.39* 1.54* 1.89* 1.85*  --   --  1.85*  --  2.00*  CALCIUM 8.1* 8.1* 8.3* 8.4*  --   --  8.5*  --  8.5*  MG 2.2 1.9 2.6*  --   --   --  2.3  --  2.2   GFR Estimated  Creatinine Clearance: 16 mL/min (A) (by C-G formula based on SCr of 2 mg/dL (H)). Liver Function Tests: Recent Labs  Lab 02/10/23 1000  AST 22  ALT 15  ALKPHOS 65  BILITOT 1.2  PROT 6.1*  ALBUMIN 2.7*   No results for input(s): "LIPASE", "AMYLASE" in the last 168 hours. No results for input(s): "AMMONIA" in the last 168 hours. Coagulation profile No results for input(s): "INR", "PROTIME" in the last 168 hours.  CBC: Recent Labs  Lab 02/09/23 0748 02/11/23 0958 02/24/2023 0410  WBC 7.5 10.1 11.3*  NEUTROABS 5.6  --   --   HGB 8.5* 8.3* 8.5*  HCT 26.6* 25.0* 26.3*  MCV 86.9 84.7 85.4  PLT 131* 115* 174   Cardiac Enzymes: No results for input(s): "CKTOTAL", "CKMB", "CKMBINDEX", "TROPONINI" in the last 168 hours. BNP (last 3 results) Recent Labs    01/12/23 1127  PROBNP 2,095*   CBG: Recent Labs  Lab 02/11/23 0754 02/11/23 1115 02/11/23 1542 02/11/23 2137 02/14/2023 0723  GLUCAP 212* 242* 101* 120* 161*   D-Dimer: No results for input(s): "DDIMER" in the last 72 hours. Hgb A1c: No results for input(s): "HGBA1C" in the last 72 hours. Lipid Profile: No results for input(s): "CHOL", "HDL", "LDLCALC", "TRIG", "CHOLHDL", "LDLDIRECT" in the last 72 hours. Thyroid function studies: No results for input(s): "TSH", "T4TOTAL", "T3FREE", "THYROIDAB" in the last 72 hours.  Invalid input(s): "FREET3" Anemia work up: No results for input(s): "VITAMINB12", "FOLATE", "FERRITIN", "TIBC", "IRON", "RETICCTPCT" in the last 72 hours. Sepsis Labs: Recent Labs  Lab 02/09/23 0748 02/11/23 0958 02/02/2023 0410  PROCALCITON  --   --  2.18  WBC 7.5 10.1 11.3*    Microbiology Recent Results (from the past 240 hour(s))  Resp panel by RT-PCR (RSV, Flu  A&B, Covid) Anterior Nasal Swab     Status: None   Collection Time: 02/13/2023  5:58 PM   Specimen: Anterior Nasal Swab  Result Value Ref Range Status   SARS Coronavirus 2 by RT PCR NEGATIVE NEGATIVE Final    Comment:  (NOTE) SARS-CoV-2 target nucleic acids are NOT DETECTED.  The SARS-CoV-2 RNA is generally detectable in upper respiratory specimens during the acute phase of infection. The lowest concentration of SARS-CoV-2 viral copies this assay can detect is 138 copies/mL. A negative result does not preclude SARS-Cov-2 infection and should not be used as the sole basis for treatment or other patient management decisions. A negative result may occur with  improper specimen collection/handling, submission of specimen other than nasopharyngeal swab, presence of viral mutation(s) within the areas targeted by this assay, and inadequate number of viral copies(<138 copies/mL). A negative result must be combined with clinical observations, patient history, and epidemiological information. The expected result is Negative.  Fact Sheet for Patients:  EntrepreneurPulse.com.au  Fact Sheet for Healthcare Providers:  IncredibleEmployment.be  This test is no t yet approved or cleared by the [place] FDA and  has been authorized for detection and/or diagnosis of SARS-CoV-2 by FDA under an Emergency Use Authorization (EUA). This EUA will remain  in effect (meaning this test can be used) for the duration of the COVID-19 declaration under Section 564(b)(1) of the Act, 21 U.S.C.section 360bbb-3(b)(1), unless the authorization is terminated  or revoked sooner.       Influenza A by PCR NEGATIVE NEGATIVE Final   Influenza B by PCR NEGATIVE NEGATIVE Final    Comment: (NOTE) The Xpert Xpress SARS-CoV-2/FLU/RSV plus assay is intended as an aid in the diagnosis of influenza from Nasopharyngeal swab specimens and should not be used as a sole basis for treatment. Nasal washings and aspirates are unacceptable for Xpert Xpress SARS-CoV-2/FLU/RSV testing.  Fact Sheet for Patients: EntrepreneurPulse.com.au  Fact Sheet for Healthcare  Providers: IncredibleEmployment.be  This test is not yet approved or cleared by the [place] FDA and has been authorized for detection and/or diagnosis of SARS-CoV-2 by FDA under an Emergency Use Authorization (EUA). This EUA will remain in effect (meaning this test can be used) for the duration of the COVID-19 declaration under Section 564(b)(1) of the Act, 21 U.S.C. section 360bbb-3(b)(1), unless the authorization is terminated or revoked.     Resp Syncytial Virus by PCR NEGATIVE NEGATIVE Final    Comment: (NOTE) Fact Sheet for Patients: EntrepreneurPulse.com.au  Fact Sheet for Healthcare Providers: IncredibleEmployment.be  This test is not yet approved or cleared by the [place] FDA and has been authorized for detection and/or diagnosis of SARS-CoV-2 by FDA under an Emergency Use Authorization (EUA). This EUA will remain in effect (meaning this test can be used) for the duration of the COVID-19 declaration under Section 564(b)(1) of the Act, 21 U.S.C. section 360bbb-3(b)(1), unless the authorization is terminated or revoked.  Performed at [facility], [address] [place], [place] [address]   MRSA Next Gen by PCR, Nasal     Status: None   Collection Time: 02/11/23 11:44 AM   Specimen: Nasal Mucosa; Nasal Swab  Result Value Ref Range Status   MRSA by PCR Next Gen NOT DETECTED NOT DETECTED Final    Comment: (NOTE) The GeneXpert MRSA Assay (FDA approved for NASAL specimens only), is one component of a comprehensive MRSA colonization surveillance program. It is not intended to diagnose MRSA infection nor to guide or monitor treatment for MRSA infections.  Test performance is not FDA approved in patients less than 75 years old. Performed at [facility], [address] [place], [place] [address]     Procedures and diagnostic studies:  CARDIAC CATHETERIZATION  Result Date:  02/11/2023 1. Normal filling pressures. 2. Mild-moderate pulmonary arterial hypertension. 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 3. Preserved cardiac output.   DG Chest Port 1 View  Result Date: 02/11/2023 CLINICAL DATA:  Pleural effusion post thoracentesis. EXAM: PORTABLE CHEST 1 VIEW COMPARISON:  February 07, 2023. FINDINGS: Reduction in pleural fluid in the RIGHT chest. Moderately large LEFT pleural effusion may be slightly larger than on the previous radiograph. Trachea midline. Cardiomediastinal contours and hilar structures with persistent cardiac enlargement. EKG leads project over the chest. Interstitial and alveolar opacities persist with improved aeration at the RIGHT lung base. No sign of pneumothorax. On limited assessment no acute skeletal findings. IMPRESSION: No pneumothorax post thoracentesis. Enlarging LEFT effusion. Persistent bilateral interstitial and airspace disease. Findings could reflect a combination of edema and multifocal infection. Electronically Signed   By: Zetta Bills M.D.   On: 02/11/2023 15:11   US THORACENTESIS ASP PLEURAL SPACE W/IMG GUIDE  Result Date: 02/11/2023 INDICATION: Patient with history of stage IV neuroendocrine tumor, AFib on Eliquis who presented complaining of shortness of breath. Patient found to have acute decompensated diastolic heart failure with persistent pulmonary edema. Attempt to aggressively diuresed patient was unsuccessful and respiratory status continues to decline. Patient placed on BiPAP, transferred to ICU and request received to perform diagnostic and therapeutic thoracentesis for pleural effusion. EXAM: ULTRASOUND GUIDED DIAGNOSTIC AND THERAPEUTIC RIGHT THORACENTESIS MEDICATIONS: 10 mL 1 % lidocaine COMPLICATIONS: None immediate. PROCEDURE: An ultrasound guided thoracentesis was thoroughly discussed with the patient's son and questions answered. The benefits, risks, alternatives and complications were also discussed.  The patient understands and wishes to proceed with the procedure. Written consent was obtained. Ultrasound was performed to localize and mark an adequate pocket of fluid in the right chest. The area was then prepped and draped in the normal sterile fashion. 1% Lidocaine was used for local anesthesia. Under ultrasound guidance a 6 Fr Safe-T-Centesis catheter was introduced. Thoracentesis was performed. The catheter was removed and a dressing applied. FINDINGS: A total of approximately 1 L of clear, amber fluid was removed. Samples were sent to the laboratory as requested by the clinical team. IMPRESSION: Successful ultrasound guided right thoracentesis yielding 1 L of pleural fluid. Read by: Narda Rutherford, AGNP-BC Electronically Signed   By: Albin Felling M.D.   On: 02/11/2023 15:03               LOS: 9 days   Jiayi Lengacher  [facility]   Pager on [email or web address]. If 7PM-7AM, please contact night-coverage at [email or web address]     02/02/2023, 12:55 PM

## 2023-02-12 NOTE — Progress Notes (Signed)
Palliative Care Progress Note, Assessment & Plan   Patient Name: [name]       Date: 02/25/2023 DOB: [birth year]  Age: 89 y.o. MRN#: [id number] Attending Physician: [name], MD Primary Care Physician: [name], FNP Admit Date: 02/11/2023  Subjective: Patient is sitting in bed with heated high flow nasal cannula in place.  Her son [name] is at bedside.  Patient acknowledges my presence and is able to make her wishes known.  She speaks with a soft whisper but is awake alert and oriented x 4.  HPI: 89 y.o. female  with past medical history of atrial fibrillation on Eliquis, mild pulmonary hypertension, stage IV neuroendocrine tumor favor carcinoid, CKD stage 3b at baseline, T2DM, and chronic diastolic heart failure. Admitted on 02/19/2023 from home with increased shortness of breath for the last 2-3 days. Symptoms found to be related to acute decompensated diastolic heart failure. Has received aggressive diuresis during hospital stay with difficult management of fluid status. Imaging with persistent pulmonary edema pattern.   2/11- Declining respiratory status, Placed on BiPAP, A. Fib with rates in 120's, Increasing and addition of diuretics   2/12-SOB, 10 L high flow nasal cannula as well as BiPAP during the day and overnight.   2/13-respiratory status remains tenuous.  Heart failure team consulted and plan for right heart cath on Thursday.  Lasix gtt. started.   2/14 - transferred to ICU, bipap need increased, right thoracentesis - 1L   2/15 - right heart cath Palliative medicine was consulted for assisting with goals of care conversations  Summary of counseling/coordination of care: After reviewing the patient's chart and assessing the patient at bedside, I spoke with patient and her  son in regards to plan and goals of care.  Patient asks if she is "doing okay?".  We discussed her respiratory status as well as plan for right heart cath today for diagnostic purposes. Reviewed use of bipap overnight and HHFNC during the day to best support her respiratory status. Patient again confirmed she would not want to be placed on a ventilator.    Therapeutic silence and active listening provided for patient to share her thoughts and emotions regarding current medical situation.  She shares she wants to just get better, has a strong will to live, and just wants to know that she is doing okay.  Emotional support provided.  We discussed boundaries of medical treatment. Patient remains in agreement to accept all offered, available, and appropriate interventions to sustain her life except for use of a mechanical ventilator. DNR also remains.   Physical Exam Vitals reviewed.  Constitutional:      General: She is not in acute distress.    Appearance: She is ill-appearing.  HENT:     Head: Normocephalic.  Cardiovascular:     Rate and Rhythm: Tachycardia present.  Pulmonary:     Comments: HFNC in place, dyspnea with extended conversation Musculoskeletal:     Comments: Generalized weakness  Neurological:     Mental Status: She is alert and oriented to person, place, and time.  Psychiatric:        Mood and Affect: Mood is not anxious.        Behavior:  Behavior is not agitated.             Total Time 35 minutes   Lema Heinkel L. Ilsa Iha, FNP-BC Palliative Medicine Team Team Phone # [phone]

## 2023-02-12 NOTE — Progress Notes (Signed)
PT Cancellation Note  Patient Details Name: [name] MRN: [id number] DOB: 1934-08-07   Cancelled Treatment:    Reason Eval/Treat Not Completed: Medical issues which prohibited therapy (Pt noted with transfer to CCU due to change in medical status.  Currently at cath lab and discussions continue regarding goals of care. PT to re-attempt when pt is next available and medically appropriate.)  Narciso Stoutenburg H. Owens Shark, PT, DPT, NCS 02/24/2023, 1:38 PM [phone]

## 2023-02-12 NOTE — Progress Notes (Signed)
Pt off unit to cath lab. Report given to cath lab nurse.

## 2023-02-13 ENCOUNTER — Inpatient Hospital Stay: Payer: Medicare Other

## 2023-02-13 ENCOUNTER — Encounter: Payer: Self-pay | Admitting: Cardiology

## 2023-02-13 DIAGNOSIS — I5033 Acute on chronic diastolic (congestive) heart failure: Secondary | ICD-10-CM | POA: Diagnosis not present

## 2023-02-13 DIAGNOSIS — J9621 Acute and chronic respiratory failure with hypoxia: Secondary | ICD-10-CM | POA: Diagnosis not present

## 2023-02-13 DIAGNOSIS — I482 Chronic atrial fibrillation, unspecified: Secondary | ICD-10-CM | POA: Diagnosis not present

## 2023-02-13 DIAGNOSIS — J90 Pleural effusion, not elsewhere classified: Secondary | ICD-10-CM | POA: Diagnosis not present

## 2023-02-13 DIAGNOSIS — Z7189 Other specified counseling: Secondary | ICD-10-CM | POA: Diagnosis not present

## 2023-02-13 LAB — GLUCOSE, CAPILLARY
Glucose-Capillary: 187 mg/dL — ABNORMAL HIGH (ref 70–99)
Glucose-Capillary: 143 mg/dL — ABNORMAL HIGH (ref 70–99)
Glucose-Capillary: 236 mg/dL — ABNORMAL HIGH (ref 70–99)
Glucose-Capillary: 251 mg/dL — ABNORMAL HIGH (ref 70–99)

## 2023-02-13 LAB — CBC
HCT: 24.9 % — ABNORMAL LOW (ref 36.0–46.0)
Hemoglobin: 8.2 g/dL — ABNORMAL LOW (ref 12.0–15.0)
MCH: 27.4 pg (ref 26.0–34.0)
MCHC: 32.9 g/dL (ref 30.0–36.0)
MCV: 83.3 fL (ref 80.0–100.0)
Platelets: 200 10*3/uL (ref 150–400)
RBC: 2.99 MIL/uL — ABNORMAL LOW (ref 3.87–5.11)
RDW: 15.7 % — ABNORMAL HIGH (ref 11.5–15.5)
WBC: 8.9 10*3/uL (ref 4.0–10.5)
nRBC: 0 % (ref 0.0–0.2)

## 2023-02-13 LAB — LACTATE DEHYDROGENASE, PLEURAL OR PERITONEAL FLUID: LD, Fluid: 117 U/L — ABNORMAL HIGH (ref 3–23)

## 2023-02-13 LAB — BASIC METABOLIC PANEL
Anion gap: 13 (ref 5–15)
BUN: 87 mg/dL — ABNORMAL HIGH (ref 8–23)
CO2: 31 mmol/L (ref 22–32)
Calcium: 8.1 mg/dL — ABNORMAL LOW (ref 8.9–10.3)
Chloride: 85 mmol/L — ABNORMAL LOW (ref 98–111)
Creatinine, Ser: 2.06 mg/dL — ABNORMAL HIGH (ref 0.44–1.00)
GFR, Estimated: 23 mL/min — ABNORMAL LOW (ref >60)
Glucose, Bld: 172 mg/dL — ABNORMAL HIGH (ref 70–99)
Potassium: 2.8 mmol/L — ABNORMAL LOW (ref 3.5–5.1)
Sodium: 129 mmol/L — ABNORMAL LOW (ref 135–145)

## 2023-02-13 LAB — PROTEIN, PLEURAL OR PERITONEAL FLUID: Total protein, fluid: <3 g/dL

## 2023-02-13 LAB — LACTATE DEHYDROGENASE: LDH: 159 U/L (ref 98–192)

## 2023-02-13 LAB — MAGNESIUM: Magnesium: 2.1 mg/dL (ref 1.7–2.4)

## 2023-02-13 MED ORDER — SODIUM CHLORIDE 0.9 % IV SOLN
2.0000 g | INTRAVENOUS | Status: DC
  Administered 2023-02-13 – 2023-02-14 (×2): 2 g via INTRAVENOUS
  Filled 2023-02-13 (×2): qty 12.5

## 2023-02-13 MED ORDER — LIDOCAINE HCL (PF) 1 % IJ SOLN
10.0000 mL | Freq: Once | INTRAMUSCULAR | Status: AC
  Administered 2023-02-13: 10 mL via INTRADERMAL

## 2023-02-13 MED ORDER — POTASSIUM CHLORIDE 10 MEQ/100ML IV SOLN
10.0000 meq | INTRAVENOUS | Status: AC
  Administered 2023-02-13 (×4): 10 meq via INTRAVENOUS
  Filled 2023-02-13 (×4): qty 100

## 2023-02-13 MED ORDER — APIXABAN 2.5 MG PO TABS
2.5000 mg | ORAL_TABLET | Freq: Two times a day (BID) | ORAL | Status: DC
  Administered 2023-02-13 – 2023-02-14 (×3): 2.5 mg via ORAL
  Filled 2023-02-13 (×3): qty 1

## 2023-02-13 NOTE — Progress Notes (Signed)
Occupational Therapy Treatment Patient Details Name: [name] MRN: [id number] DOB: 10-22-1934 Today's Date: 02/13/2023   History of present illness [name] is a 89 year old female with hypertension, hyperlipidemia, atrial fibrillation on Eliquis, CAD, history of a stroke, mild pulmonary hypertension, stage IV neuroendocrine tumor favor carcinoid, on chornic who presents emergency department for chief concerns of shortness of breath.   OT comments  Upon entering the room, pt supine in bed and resting comfortably. Pt is now on 50Ls HFNC. Pt requesting to brush her teeth. She was placed into chair position in bed and all needed items set up. She was able to brush teeth with L UE but fatigues very quickly with activity and O2 decreased to 80's with activity. Pt spitting contents into cup and needing to rest further before rinsing mouth. Since pt on 50Ls O2, very fatigued with minimal activity, and O2 decreasing with oral care, OT did not attempt EOB or OOB activities secondary to further concerns for respiratory distress and pt now DNR. Pt appears comfortable and eyes closed at end of session. RN notified of pt's level of fatigue with oral care this session.    Recommendations for follow up therapy are one component of a multi-disciplinary discharge planning process, led by the attending physician.  Recommendations may be updated based on patient status, additional functional criteria and insurance authorization.    Follow Up Recommendations  Skilled nursing-short term rehab (<3 hours/day)     Assistance Recommended at Discharge Intermittent Supervision/Assistance  Patient can return home with the following  A little help with walking and/or transfers;A little help with bathing/dressing/bathroom;Help with stairs or ramp for entrance;Assist for transportation;Assistance with cooking/housework   Equipment Recommendations  Other (comment) (defer to next venue of care)        Precautions / Restrictions Precautions Precautions: Fall Precaution Comments: 50Ls HFNC Restrictions Weight Bearing Restrictions: No              ADL either performed or assessed with clinical judgement   ADL Overall ADL's : Needs assistance/impaired     Grooming: Oral care;Minimal assistance;Bed level                                      Extremity/Trunk Assessment Upper Extremity Assessment Upper Extremity Assessment: Generalized weakness   Lower Extremity Assessment Lower Extremity Assessment: Generalized weakness        Vision Patient Visual Report: No change from baseline            Cognition Arousal/Alertness: Awake/alert Behavior During Therapy: WFL for tasks assessed/performed Overall Cognitive Status: Within Functional Limits for tasks assessed                                                     Pertinent Vitals/ Pain       Pain Assessment Pain Assessment: No/denies pain         Frequency  Min 2X/week        Progress Toward Goals  OT Goals(current goals can now be found in the care plan section)  Progress towards OT goals: Progressing toward goals     Plan Frequency remains appropriate;Discharge plan remains appropriate       AM-PAC OT "6 Clicks" Daily Activity     Outcome  Measure   Help from another person eating meals?: None Help from another person taking care of personal grooming?: None Help from another person toileting, which includes using toliet, bedpan, or urinal?: A Little Help from another person bathing (including washing, rinsing, drying)?: A Little Help from another person to put on and taking off regular upper body clothing?: None Help from another person to put on and taking off regular lower body clothing?: A Little 6 Click Score: 21    End of Session    OT Visit Diagnosis: Unsteadiness on feet (R26.81);Muscle weakness (generalized) (M62.81);History of falling (Z91.81)    Activity Tolerance Treatment limited secondary to medical complications (Comment);Patient limited by fatigue (limited secondary to respiratory status of 50Ls)   Patient Left in bed;with call bell/phone within reach;with bed alarm set   Nurse Communication Precautions        Time: 4765-4650 OT Time Calculation (min): 15 min  Charges: OT General Charges $OT Visit: 1 Visit OT Treatments $Self Care/Home Management : 8-22 mins  Darleen Crocker, MS, OTR/L , CBIS ascom [phone]  02/13/23, 10:32 AM

## 2023-02-13 NOTE — Progress Notes (Signed)
Daily Progress Note   Patient Name: [name]       Date: 02/13/2023 DOB: May 24, 1934  Age: 89 y.o. MRN#: [id number] Attending Physician: [name], MD Primary Care Physician: [name], FNP Admit Date: 02/09/2023  Reason for Consultation/Follow-up: Establishing goals of care  Subjective: Notified by team member family wants to speak with PMT. Patient is currently resting in bed with son [name] at bedside. She is currently on high flow cannula. She appears to be tired and has some WOB noted.   [name] discusses that he has been helping his mother, and he discusses patient's decline over the past month. Spoke regarding thoracentesis.   Patient states she wants to continue current care with limit of DNR/DNI. She states she is a woman of faith, and believes in God's control of her situation.   [name] called his brother [name], who he states is HPOA. [name] states he wants to honor his mother's wishes, and states his mother wanted to be able to go to his retirement party in the next couple of days, but will not be able to. [name] and [name] both understand her tenuous status, and they both state if their mother comes to a place of wanting to shift to comfort care, they will support this.   [name] discusses he will be coming to bedside this weekend. Discussed follow up next week and he requests I follow up with him Tuesday on my return to service.   Length of Stay: 10  Current Medications: Scheduled Meds:   apixaban  2.5 mg Oral BID   benzonatate  200 mg Oral TID   Chlorhexidine Gluconate Cloth  6 each Topical Daily   diclofenac Sodium  4 g Topical QID   feeding supplement  237 mL Oral BID BM   fluticasone  2 spray Each Nare Daily   gabapentin  300 mg Oral BID   insulin aspart   0-5 Units Subcutaneous QHS   insulin aspart  0-9 Units Subcutaneous TID WC   insulin glargine-yfgn  10 Units Subcutaneous Daily   levothyroxine  88 mcg Oral QAC breakfast   metoprolol succinate  25 mg Oral Daily   multivitamin with minerals  1 tablet Oral Daily   mouth rinse  15 mL Mouth Rinse 4 times per day   rosuvastatin  20 mg  Oral QHS   simethicone  80 mg Oral QID   sodium chloride flush  3 mL Intravenous Q12H   sodium chloride flush  3 mL Intravenous Q12H    Continuous Infusions:  sodium chloride     amiodarone 60 mg/hr (02/13/23 1407)   ceFEPime (MAXIPIME) IV 2 g (02/13/23 0933)    PRN Meds: sodium chloride, acetaminophen, guaiFENesin-dextromethorphan, ipratropium-albuterol, metoprolol tartrate, morphine injection, ondansetron (ZOFRAN) IV, mouth rinse, polyethylene glycol, senna-docusate, sodium chloride flush, traMADol  Physical Exam Pulmonary:     Comments: On high flow cannula.  Neurological:     Mental Status: She is alert.             Vital Signs: BP (!) 101/58   Pulse (!) 119   Temp 97.7 F (36.5 C) (Oral)   Resp (!) 25   Ht 5\' 2"  (1.575 m)   Wt 55.3 kg   SpO2 96%   BMI 22.30 kg/m  SpO2: SpO2: 96 % O2 Device: O2 Device: High Flow Nasal Cannula O2 Flow Rate: O2 Flow Rate (L/min): 50 L/min  Intake/output summary:  Intake/Output Summary (Last 24 hours) at 02/13/2023 1608 Last data filed at 02/13/2023 0600 Gross per 24 hour  Intake 173.14 ml  Output 901 ml  Net -727.86 ml   LBM: Last BM Date : 02/11/2023 Baseline Weight: Weight: 54.3 kg Most recent weight: Weight: 55.3 kg   Patient Active Problem List   Diagnosis Date Noted   Bilateral pleural effusion 02/06/2023   Protein-calorie malnutrition, moderate ([place]) 02/06/2023   Acute on chronic hypoxic respiratory failure ([place]) 02/17/2023   Hyperlipidemia 02/11/2023   Acute exacerbation of CHF (congestive heart failure) ([place]) 02/09/2023   Dysuria 02/05/2023   Malnutrition of moderate degree 12/09/2022    Acute combined systolic and diastolic congestive heart failure ([place]) 12/09/2022   Palliative care encounter 12/04/2022   Normocytic anemia 12/04/2022   History of stroke 12/04/2022   Atrial fibrillation, chronic ([place]) 12/04/2022   HAP (hospital-acquired pneumonia) 12/03/2022   Anxiety 12/03/2022   Malignant neoplasm of lung ([place]) 12/03/2022   Acute hypoxemic respiratory failure ([place]) 12/03/2022   Acute on chronic respiratory failure with hypoxemia ([place]) 12/02/2022   Acute respiratory failure with hypoxia ([place]) 12/01/2022   Stage 3b chronic kidney disease (CKD) ([place]) 12/01/2022   Protein-calorie malnutrition, severe 11/18/2022   Hypoxia 11/12/2022   Chronic atrial fibrillation with rapid ventricular response ([place]) 08/16/2018   Chest pain 07/08/2017   ARF (acute renal failure) ([place]) 07/08/2017   Acute diverticulitis 07/08/2017   Head injury    Right hip pain 01/03/2017   Muscle strain 01/03/2017   Atrial fibrillation with rapid ventricular response ([place]) 12/12/2016   Atrial fibrillation with RVR ([place])    Diabetes mellitus with complication (HCC)    Faintness    Thrombocytopenia ([place]) 04/07/2016   Anemia 04/07/2016   SOB (shortness of breath) on exertion    Syncope and collapse    Syncope 04/02/2016   Physical deconditioning 04/20/2015   Dyspnea 04/17/2015   SOB (shortness of breath) 04/17/2015   Acute renal failure superimposed on stage 3 chronic kidney disease ([place]) 04/17/2015   Hypokalemia 04/17/2015   Atrial fibrillation (HCC) CHA2DS2-VASc Score 7 03/13/2012   HTN (hypertension) 03/13/2012   Hypothyroid 03/13/2012   Insulin dependent type 2 diabetes mellitus ([place]) 03/13/2012   CAD (coronary artery disease) 03/13/2012   Carcinoid tumor of lung    Stroke [facility])     Palliative Care Assessment & Plan    Recommendations/Plan: Continue current care with  DNR/DNI status.  I will follow up Tuesday on my return to service at [facility] request.   Code Status:    Code  Status Orders  (From admission, onward)           Start     Ordered   02/07/23 1319  Do not attempt resuscitation (DNR)  Continuous       Question Answer Comment  If patient has no pulse and is not breathing Do Not Attempt Resuscitation   If patient has a pulse and/or is breathing: Medical Treatment Goals LIMITED ADDITIONAL INTERVENTIONS: Use medication/IV fluids and cardiac monitoring as indicated; Do not use intubation or mechanical ventilation (DNI), also provide comfort medications.  Transfer to Progressive/Stepdown as indicated, avoid Intensive Care.   Consent: Discussion documented in EHR or advanced directives reviewed      02/07/23 1319           Code Status History     Date Active Date Inactive Code Status Order ID Comments User Context   02/20/2023 2033 02/07/2023 1319 Full Code [id number]  Criss Alvine, DO ED   01/12/2023 1049 02/20/2023 1257 Full Code [id number] Patient does not want to be on long-term mechanical ventilation, her son [name] is present.  Temporary measures to keep her life sustainable is acceptable to the patient. Adrian Prows, MD Outpatient   12/01/2022 1844 12/09/2022 2111 DNR [id number]  Jose Persia, MD ED   11/12/2022 1435 11/26/2022 2344 DNR [id number]  Damita Lack, MD ED   08/16/2018 1502 08/18/2018 1456 Full Code [id number]  Adrian Prows, MD ED   07/08/2017 0552 07/09/2017 1931 Full Code [id number]  Jani Gravel, MD Inpatient   01/03/2017 0150 01/04/2017 2149 Full Code [id number]  Norval Morton, MD ED   12/12/2016 1950 12/17/2016 1549 Full Code [id number]  Dixie Dials, MD ED   06/24/2016 1605 06/24/2016 2214 Full Code [id number]  Adrian Prows, MD Inpatient   04/07/2016 1653 04/09/2016 2046 Full Code [id number]  Radene Gunning, NP ED   04/02/2016 2225 04/04/2016 1851 Full Code [id number]  Lavina Hamman, MD ED   04/20/2015 0904 04/20/2015 2157 Full Code [id number]  Adrian Prows, MD Inpatient   04/17/2015 2238 04/20/2015 0904 Full Code [id number]  Ivor Costa, MD Inpatient    03/29/2013 1731 03/30/2013 1607 Full Code [id number]  Laverda Page, MD Inpatient   03/13/2012 2034 03/17/2012 1820 Partial Code [id number]  Derenda Fennel, RN Inpatient      Advance Directive Documentation    Flowsheet Row Most Recent Value  Type of Advance Directive Living will  Pre-existing out of facility DNR order (yellow form or pink MOST form) --  "MOST" Form in Place? --       Thank you for allowing the Palliative Medicine Team to assist in the care of this patient.     Asencion Gowda, NP  Please contact Palliative Medicine Team phone at [phone] for questions and concerns.

## 2023-02-13 NOTE — Progress Notes (Signed)
Patient ID: [name], female   DOB: 07/01/1934, 89 y.o.   MRN: [id number]     Advanced Heart Failure Rounding Note  PCP-Cardiologist: None   Subjective:    Minimal change clinically, she is on HFNC this morning with HR 110s-120s in atrial fibrillation.  SBP 90s-100s.   Creatinine appears to have stabilized 1.85 => 2.0 => 2.06.    Echo: EF 65-70%, moderate LVH, moderate RVE with normal systolic function, PASP 50, mild MR, mild-moderate TR, possible paradoxical low gradient severe AS with mean gradient 25 and AVA 0.59 cm^2.   RHC Procedural Findings: Hemodynamics (mmHg) RA mean 5 RV 48/4 PA 51/19, mean 31 PCWP mean 12 Oxygen saturations: PA 57% AO 98% Cardiac Output (Fick) 4.3  Cardiac Index (Fick) 2.81 PVR 4.4 WU    Objective:   Weight Range: 55.3 kg Body mass index is 22.3 kg/m.   Vital Signs:   Temp:  [97.7 F (36.5 C)-98.6 F (37 C)] 97.8 F (36.6 C) (02/16 0400) Pulse Rate:  [101-136] 123 (02/16 0600) Resp:  [16-29] 23 (02/16 0600) BP: (87-138)/(42-79) 102/55 (02/16 0600) SpO2:  [93 %-100 %] 96 % (02/16 0600) FiO2 (%):  [55 %] 55 % (02/16 0400) Weight:  [55.3 kg] 55.3 kg (02/16 0500) Last BM Date : 02/01/2023  Weight change: Filed Weights   02/11/23 1100 02/23/2023 0500 02/13/23 0500  Weight: 58.9 kg 54 kg 55.3 kg    Intake/Output:   Intake/Output Summary (Last 24 hours) at 02/13/2023 0800 Last data filed at 02/13/2023 0600 Gross per 24 hour  Intake 316.05 ml  Output 901 ml  Net -584.95 ml      Physical Exam    General: Thin/frail, on HFNC Neck: No JVD, no thyromegaly or thyroid nodule.  Lungs: Decreased at bases.  CV: Nondisplaced PMI.  Heart tachy, irregular S1/S2, no S3/S4, 2/6 SEM RUSB.  No peripheral edema.   Abdomen: Soft, nontender, no hepatosplenomegaly, no distention.  Skin: Intact without lesions or rashes.  Neurologic: Alert and oriented x 3.  Psych: Normal affect. Extremities: No clubbing or cyanosis.  HEENT: Normal.    Telemetry   Atrial fibrillation rate 110s-120s, personally reviewed  Labs    CBC Recent Labs    02/06/2023 0410 02/18/2023 1155 02/13/23 0521  WBC 11.3*  --  8.9  HGB 8.5* 8.8*  8.5* 8.2*  HCT 26.3* 26.0*  25.0* 24.9*  MCV 85.4  --  83.3  PLT 174  --  329   Basic Metabolic Panel Recent Labs    01/31/2023 0410 02/18/2023 1155 02/13/23 0521  NA 132* 131*  128* 129*  K 4.1 2.8*  2.8* 2.8*  CL 85*  --  85*  CO2 33*  --  31  GLUCOSE 132*  --  172*  BUN 83*  --  87*  CREATININE 2.00*  --  2.06*  CALCIUM 8.5*  --  8.1*  MG 2.2  --  2.1   Liver Function Tests Recent Labs    02/10/23 1000  AST 22  ALT 15  ALKPHOS 65  BILITOT 1.2  PROT 6.1*  ALBUMIN 2.7*   No results for input(s): "LIPASE", "AMYLASE" in the last 72 hours. Cardiac Enzymes No results for input(s): "CKTOTAL", "CKMB", "CKMBINDEX", "TROPONINI" in the last 72 hours.  BNP: BNP (last 3 results) Recent Labs    12/01/22 1503 02/18/2023 1316 02/08/23 1016  BNP 228.0* 335.2* 293.7*    ProBNP (last 3 results) Recent Labs    01/12/23 1127  PROBNP 2,095*  D-Dimer No results for input(s): "DDIMER" in the last 72 hours.  Hemoglobin A1C No results for input(s): "HGBA1C" in the last 72 hours. Fasting Lipid Panel No results for input(s): "CHOL", "HDL", "LDLCALC", "TRIG", "CHOLHDL", "LDLDIRECT" in the last 72 hours. Thyroid Function Tests No results for input(s): "TSH", "T4TOTAL", "T3FREE", "THYROIDAB" in the last 72 hours.  Invalid input(s): "FREET3"  Other results:   Imaging    CARDIAC CATHETERIZATION  Result Date: 02/11/2023 1. Normal filling pressures. 2. Mild-moderate pulmonary arterial hypertension. 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 3. Preserved cardiac output.     Medications:     Scheduled Medications:  benzonatate  200 mg Oral TID   Chlorhexidine Gluconate Cloth  6 each Topical Daily   diclofenac Sodium  4 g Topical QID   feeding supplement  237  mL Oral BID BM   fluticasone  2 spray Each Nare Daily   gabapentin  300 mg Oral BID   insulin aspart  0-5 Units Subcutaneous QHS   insulin aspart  0-9 Units Subcutaneous TID WC   insulin glargine-yfgn  10 Units Subcutaneous Daily   levothyroxine  88 mcg Oral QAC breakfast   metoprolol succinate  25 mg Oral Daily   multivitamin with minerals  1 tablet Oral Daily   mouth rinse  15 mL Mouth Rinse 4 times per day   rosuvastatin  20 mg Oral QHS   simethicone  80 mg Oral QID   sodium chloride flush  3 mL Intravenous Q12H   sodium chloride flush  3 mL Intravenous Q12H    Infusions:  sodium chloride     amiodarone 30 mg/hr (02/13/23 0600)    PRN Medications: sodium chloride, acetaminophen, guaiFENesin-dextromethorphan, ipratropium-albuterol, metoprolol tartrate, morphine injection, ondansetron (ZOFRAN) IV, mouth rinse, polyethylene glycol, senna-docusate, sodium chloride flush, traMADol   Assessment/Plan   1. Acute on chronic diastolic CHF: In setting of permanent atrial fibrillation.  Echo this admission with EF 65-70%, moderate LVH, moderate RVE with normal systolic function, PASP 50, mild MR, mild-moderate TR, possible paradoxical low gradient severe AS with mean gradient 25 and AVA 0.59 cm^2.   Diuresis complicated by cardiorenal syndrome with rising creatinine.  Initial CT chest showed ground glass infiltrates and pleural effusions suggestive of CHF.  She had a large, partially loculated right pleural effusion and moderate effusion on left. Now s/p right thoracentesis.  RHC after diuresis on 2/15 showed normal filling pressures and mild pulmonary hypertension (suspect group 3 from lung disease/hypoxemia).  She is now off Lasix gtt with normal filling pressures on RHC and rising creatinine. Creatinine fairly stable at 2.06 today.   - Would hold diuretics today based on yesterday's RHC and volume looks ok on exam, can restart po diuretic tomorrow if creatinine stable.  2. Hypervolemic  hyponatremia: She had tolvaptan 2/13.  Na 129 today.    - Fluid restrict 1500 cc.  3. AKI on CKD stage 3b: Cardiorenal syndrome.  This is limiting our ability to diurese her. Creatinine mildly higher at 2.06 today.  - As above, holding diuretics today.  4. Pleural effusions: CT chest with partially loculated effusions, large on right and moderate on left.  Suspect related to chronic CHF. Now s/p right thoracentesis.  - With ongoing hypoxemia/oxygen requirement, would evaluate left lung for thoracentesis by ultrasound (has been ordered by hospitalist).  5. Atrial fibrillation: Permanent.  HR around 120 now.   - We are using amiodarone gtt for now just for rate control, can increase to  60 mg/hr as long as BP tolerates.  She has been off amiodarone due to thyroid abnormalities but ok to use transiently if needed.  - Eliquis held for possible thoracentesis, restart afterwards.   6. CAD: Nonobstructive on 2017 cath.  7. Aortic stenosis: Suspect paradoxical low flow/low gradient severe AS on echo this admission.  This likely worsens her CHF.  Currently too unstable for TAVR consideration.  8. ID: Still with significant oxygen requirement despite diuresis and normal filling pressures on RHC.  PCT now up to 2.18 though WBCs not elevated. Higher PCT may be driven by renal dysfunction but concerned for possible PNA.  - Would consider antibiotic treatment for HCAP.  9. Acute hypoxemic respiratory failure: Remains on HFNC, no change.  I think this is multifactorial with CHF, pleural effusions, possible PNA.  CHF has been adequately treated and she has had right thoracentesis but not much change clinically.  As above, PCT trending up.  - Repeat CXR.  - Evaluate for left thoracentesis.  - Consider empiric coverage for HCAP   Guarded prognosis, DNR/DNI and very frail with cardiorenal syndrome and recalcitrant diastolic CHF.  Agree with goals of care discussions.    CRITICAL CARE Performed by: Loralie Champagne  Total critical care time: 35 minutes  Critical care time was exclusive of separately billable procedures and treating other patients.  Critical care was necessary to treat or prevent imminent or life-threatening deterioration.  Critical care was time spent personally by me on the following activities: development of treatment plan with patient and/or surrogate as well as nursing, discussions with consultants, evaluation of patient's response to treatment, examination of patient, obtaining history from patient or surrogate, ordering and performing treatments and interventions, ordering and review of laboratory studies, ordering and review of radiographic studies, pulse oximetry and re-evaluation of patient's condition.   Length of Stay: [place], MD  02/13/2023, 8:00 AM  Advanced Heart Failure Team Pager [phone] (M-F; 7a - 5p)  Please contact [facility] for night-coverage after hours (5p -7a ) and weekends on [email or web address]

## 2023-02-13 NOTE — Progress Notes (Signed)
PT Cancellation Note  Patient Details Name: MADI BONFIGLIO MRN: [id number] DOB: Apr 23, 1934   Cancelled Treatment:    Reason Eval/Treat Not Completed: Patient not medically ready. Will re-attempt if more appropriate tomorrow.   Ahnna Dungan 02/13/2023, 10:50 AM

## 2023-02-13 NOTE — Progress Notes (Addendum)
Nutrition Follow-up  DOCUMENTATION CODES:   Non-severe (moderate) malnutrition in context of chronic illness  INTERVENTION:   If pt wishes to pursue full aggressive care, recommend NGT placement and nutrition support.   Ensure Enlive po BID, each supplement provides 350 kcal and 20 grams of protein.  Magic cup TID with meals, each supplement provides 290 kcal and 9 grams of protein  MVI po daily   Pt at high refeed risk; recommend monitor potassium, magnesium and phosphorus labs daily until stable  Daily weights   If NGT placed, recommend:  Osmolite 1.2@55ml /hr- Initiate at 30ml/hr and increase by 46ml/hr q 8 hours until goal rate is reached.   Free water flushes 36ml q4 hours to maintain tube patency   Regimen provides 1584kcal/day, 73g/day protein and 1231ml/day of free fluid.   NUTRITION DIAGNOSIS:   Moderate Malnutrition related to chronic illness (CHF) as evidenced by moderate fat depletion, mild fat depletion, mild muscle depletion, moderate muscle depletion. -ongoing   GOAL:   Patient will meet greater than or equal to 90% of their needs -not met   MONITOR:   PO intake, Supplement acceptance, Labs, Weight trends, Skin, I & O's  ASSESSMENT:   89 y/o female with h/o atrial fibrillation, CHF, GERD, coronary artery disease, HTN, hypothyroidism, CKD III, diverticulitis, anxiety, DMII, stroke and stage IV low grade neuroendocrine tumor who is admitted with CHF and bilateral pleural effusion s/p right-sided thoracentesis on 02/11/2023 with removal of 1 L of pleural fluid.  Pt continues to have poor appetite and oral intake in hospital. Pt advanced to a regular diet today from clear liquids. Pt documented to be eating anywhere from sips/bites to 20% of meals. Per RN report, pt is drinking some Ensure; pt drank 50-75% of her Ensure yesterday and drank ~25% of an Ensure this morning. Palliative care is following and pt is requesting full aggressive care. Would recommend NGT  placement and nutrition support if pt is agreeable; this was dicussed with care team. Pt is at high refeed risk. Per chart, pt appears weight stable since admission and appears to be at her UBW. Pt -7.5L on her I & Os. Minimal UOP today.   Medications reviewed and include: insulin, synthroid, MVI, simethicone, cefepime, KCl  Labs reviewed: Na 129(L), K 2.8(L), Cl 85(L), BUN 87(H), creat 2.06(H), Mg 2.1 wnl Uric acid- 10.0(H)- 2/13 Hgb 8.2(L), Hct 24.9(L) Cbgs- 143, 187 x 24 hrs  AIC 5.8(H)- 10/2022  UOP- 915ml   Diet Order:   Diet Order             Diet clear liquid Room service appropriate? Yes; Fluid consistency: Thin  Diet effective now                  EDUCATION NEEDS:   Education needs have been addressed  Skin:  Skin Assessment: Reviewed RN Assessment  Last BM:  2/15- type 6  Height:   Ht Readings from Last 1 Encounters:  02/11/23 5\' 2"  (1.575 m)    Weight:   Wt Readings from Last 1 Encounters:  02/13/23 55.3 kg    Ideal Body Weight:  50 kg  BMI:  Body mass index is 22.3 kg/m.  Estimated Nutritional Needs:   Kcal:  1400-1600kcal/day  Protein:  70-80g/day  Fluid:  1.3-1.5L/day  Koleen Distance MS, RD, LDN Please refer to [facility] for RD and/or RD on-call/weekend/after hours pager

## 2023-02-13 NOTE — Progress Notes (Signed)
Pharmacy Antibiotic Note  [name] is a 89 y.o. female admitted on 02/17/2023 with pneumonia with pleural effusions. Respiratory status on HFNC. In AKI with Scr 2.06 (CrCl 10-30). Thoracentesis 2/16 - 500 mL fluid drained. Pharmacy has been consulted for cefepime dosing.  Plan: Cefepime IV 2 grams every 24 hours  Height: 5\' 2"  (157.5 cm) Weight: 55.3 kg (121 lb 14.6 oz) IBW/kg (Calculated) : 50.1  Temp (24hrs), Avg:98 F (36.7 C), Min:97.7 F (36.5 C), Max:98.6 F (37 C)  Recent Labs  Lab 02/09/23 0748 02/10/23 0621 02/10/23 1000 02/11/23 0307 02/11/23 0958 02/20/2023 0410 02/13/23 0521  WBC 7.5  --   --   --  10.1 11.3* 8.9  CREATININE 1.54* 1.89* 1.85* 1.85*  --  2.00* 2.06*    Estimated Creatinine Clearance: 15.5 mL/min (A) (by C-G formula based on SCr of 2.06 mg/dL (H)).    Allergies  Allergen Reactions   Codeine Nausea And Vomiting   Hydrocodone-Acetaminophen Nausea And Vomiting    Antimicrobials this admission: cefepime 2/16 >>   Dose adjustments this admission: N/a  Microbiology results: 2/16 Pleural culture: in process 2/14 MRSA PCR: not detected  Thank you for allowing pharmacy to be a part of this patient's care.  Wynelle Cleveland 02/13/2023 8:38 AM

## 2023-02-13 NOTE — Progress Notes (Signed)
Called into this patient's room by sonographer, for pt complaints of SOB, resp distress during thoracentesis. Montior showed afib with rate 130's; RR in 30's. NIBP checked and noted to be unchagned. Procedure was terminated. O2 sats 92-95%. HFNC at 55% and 50 Liters/min. Pt assited to sit more upright, and reports feeling better, less SOB.

## 2023-02-13 NOTE — Procedures (Signed)
PROCEDURE SUMMARY:  Successful image-guided left thoracentesis. Yielded 300 mL of red-tinged fluid. Procedure was halted early due to patient discomfort and respiratory distress. Patient reported feeling better with less SOB after halting procedure. O2 sat was 95%, HR in the 130s with monitor showing atrial fibrillation. No immediate complications. EBL = trace   Specimen was sent for labs. CXR ordered.  Please see imaging section of Epic for full dictation.  Lura Em PA-C 02/13/2023 12:34 PM

## 2023-02-13 NOTE — Progress Notes (Addendum)
Progress Note    [name]  OZH:[id number] DOB: Jun 28, 1934  DOA: 02/25/2023 PCP: [name], FNP      Brief Narrative:    Medical records reviewed and are as summarized below:  [name] is a 89 y.o. female with past medical history significant for hypertension, hyperlipidemia, atrial fibrillation on Eliquis, CAD, history of a stroke, mild pulmonary hypertension, stage IV neuroendocrine tumor favor carcinoid, on chornic who presents emergency department for chief concerns of shortness of breath.   Presentation consistent with acute decompensated diastolic heart failure.  Fluid status has been difficult to manage.  Patient has been aggressively diuresed however repeat chest x-ray with persistent pulmonary edema pattern.   2/11: Respiratory status continues to decline.  This morning requiring 15 L high flow nasal cannula with saturations in the low to mid 80s.  Shortness of breath persistent.  Initiated on BiPAP.  Cardiology consulted.   2/12: Shortness of breath persistent.  Patient currently on 10 L high flow nasal cannula.  Required BiPAP for several hours yesterday and then again overnight.  Volume status difficult to ascertain.  Attempting to require a chest CT without contrast.  If pleural effusions patient may require thoracentesis.  Palliative care consult appreciated.  [name]   2/13: Respiratory status remains quite tenuous.  Diuresis limited by cardiorenal syndrome worsening creatinine and hyponatremia.  Heart failure service consulted.  Started on Lasix gtt.     Assessment/Plan:   Principal Problem:   Acute on chronic hypoxic respiratory failure (HCC) Active Problems:   Acute exacerbation of CHF (congestive heart failure) (HCC)   Acute respiratory failure with hypoxia (HCC)   CAD (coronary artery disease)   Insulin dependent type 2 diabetes mellitus (HCC)   Stage 3b  chronic kidney disease (CKD) (HCC)   Carcinoid tumor of lung   HTN (hypertension)   Hypothyroid   Physical deconditioning   Protein-calorie malnutrition, severe   Atrial fibrillation, chronic (HCC)   Hyperlipidemia   Dysuria   Protein-calorie malnutrition, moderate (HCC)   Bilateral pleural effusion   Nutrition Problem: Moderate Malnutrition Etiology: chronic illness (CHF)  Signs/Symptoms: moderate fat depletion, mild fat depletion, mild muscle depletion, moderate muscle depletion   Body mass index is 22.3 kg/m.   Acute on chronic diastolic CHF: S/p right heart cath on 01/30/2023.  Normal filling pressures and mild pulmonary hypertension on RHC.    Monitor BMP, daily weight and urine output.  2D echo showed EF estimated at 65 to 70%, moderate LVH, PASP 50, mild MR, mild to moderate TR, severe aortic stenosis   Bilateral pleural effusion: S/p right-sided thoracentesis on 02/11/2023 with removal of 1 L of pleural fluid.  Plan for left-sided thoracentesis today.     Suspected pneumonia: Procalcitonin is elevated, 2.18.  Start IV cefepime given persistent severe hypoxia.   Acute on chronic hypoxic respiratory failure: He is on oxygen via heated humidified HFNC, FiO2 55% at 50 L/min.  Taper down oxygen as able.   Permanent atrial fibrillation with RVR: She is on IV amiodarone drip.  Continue metoprolol.  Resume Eliquis after thoracentesis   Hypotension: Monitor BP closely   Hypokalemia: Replete potassium and monitor levels   Hypervolemic hyponatremia: S/p tolvaptan on 02/10/2023.  Monitor sodium level closely.  Continue fluid restriction 1500 mL.   AKI on CKD stage IIIb, cardiorenal syndrome: Creatinine is trending upward.  IV Lasix has been discontinued.  Monitor BMP  Type II DM: Continue insulin glargine and NovoLog.    CAD, aortic stenosis: Not a good candidate for TAVR per cardiologist.   Dysuria: Completed 3 days of IV Rocephin.   Other comorbidities include  history of stroke, severe protein calorie malnutrition, hypothyroidism, hypertension   Diet Order             Diet clear liquid Room service appropriate? Yes; Fluid consistency: Thin  Diet effective now                            Consultants: Cardiologist Palliative care  Procedures: Right heart cath on 02/05/2023 Right-sided thoracentesis on 02/11/2023 Left-sided thoracentesis 02/13/2023    Medications:    benzonatate  200 mg Oral TID   Chlorhexidine Gluconate Cloth  6 each Topical Daily   diclofenac Sodium  4 g Topical QID   feeding supplement  237 mL Oral BID BM   fluticasone  2 spray Each Nare Daily   gabapentin  300 mg Oral BID   insulin aspart  0-5 Units Subcutaneous QHS   insulin aspart  0-9 Units Subcutaneous TID WC   insulin glargine-yfgn  10 Units Subcutaneous Daily   levothyroxine  88 mcg Oral QAC breakfast   metoprolol succinate  25 mg Oral Daily   multivitamin with minerals  1 tablet Oral Daily   mouth rinse  15 mL Mouth Rinse 4 times per day   rosuvastatin  20 mg Oral QHS   simethicone  80 mg Oral QID   sodium chloride flush  3 mL Intravenous Q12H   sodium chloride flush  3 mL Intravenous Q12H   Continuous Infusions:  sodium chloride     amiodarone 60 mg/hr (02/13/23 0830)   ceFEPime (MAXIPIME) IV     potassium chloride       Anti-infectives (From admission, onward)    Start     Dose/Rate Route Frequency Ordered Stop   02/13/23 0945  ceFEPIme (MAXIPIME) 2 g in sodium chloride 0.9 % 100 mL IVPB        2 g 200 mL/hr over 30 Minutes Intravenous Every 24 hours 02/13/23 0857     02/04/23 1500  cefTRIAXone (ROCEPHIN) 1 g in sodium chloride 0.9 % 100 mL IVPB        1 g 200 mL/hr over 30 Minutes Intravenous Every 24 hours 02/04/23 1425 02/06/23 1509              Family Communication/Anticipated D/C date and plan/Code Status   DVT prophylaxis: SCD's Start: 02/24/2023 1209 Place TED hose Start: 02/24/2023 2033     Code Status:  DNR  Family Communication: None Disposition Plan: Plan to discharge to SNF when medically stable   Status is: Inpatient Remains inpatient appropriate because: CHF exacerbation, respiratory failure      Subjective:   Interval events noted.  She says she does not feel too well this morning.  No chest pain or shortness of breath.  Objective:    Vitals:   02/13/23 0400 02/13/23 0500 02/13/23 0600 02/13/23 0852  BP: (!) 88/53  (!) 102/55   Pulse: (!) 127  (!) 123   Resp: (!) 25  (!) 23   Temp: 97.8 F (36.6 C)     TempSrc: Axillary     SpO2: 96%  96% 95%  Weight:  55.3 kg    Height:       No data found.   Intake/Output Summary (Last 24 hours) at 02/13/2023  1062 Last data filed at 02/13/2023 0600 Gross per 24 hour  Intake 173.14 ml  Output 901 ml  Net -727.86 ml   Filed Weights   02/11/23 1100 02/21/2023 0500 02/13/23 0500  Weight: 58.9 kg 54 kg 55.3 kg    Exam:   GEN: NAD SKIN: Warm and dry EYES: No pallor or icterus ENT: MMM CV: Irregular rate and rhythm PULM: Mild bilateral rhonchi ABD: soft, ND, NT, +BS CNS: AAO x 3, non focal EXT: No edema or tenderness    Data Reviewed:   I have personally reviewed following labs and imaging studies:  Labs: Labs show the following:   Basic Metabolic Panel: Recent Labs  Lab 02/09/23 0748 02/10/23 0621 02/10/23 1000 02/10/23 1735 02/11/23 0307 02/11/23 0958 02/13/2023 0410 02/20/2023 1155 02/13/23 0521  NA 129* 126* 126*   < > 129* 129* 132* 131*  128* 129*  K 3.1* 5.0 4.9  --  4.1  --  4.1 2.8*  2.8* 2.8*  CL 89* 88* 88*  --  85*  --  85*  --  85*  CO2 29 28 28   --  31  --  33*  --  31  GLUCOSE 200* 253* 219*  --  215*  --  132*  --  172*  BUN 50* 65* 65*  --  77*  --  83*  --  87*  CREATININE 1.54* 1.89* 1.85*  --  1.85*  --  2.00*  --  2.06*  CALCIUM 8.1* 8.3* 8.4*  --  8.5*  --  8.5*  --  8.1*  MG 1.9 2.6*  --   --  2.3  --  2.2  --  2.1   < > = values in this interval not displayed.    GFR Estimated Creatinine Clearance: 15.5 mL/min (A) (by C-G formula based on SCr of 2.06 mg/dL (H)). Liver Function Tests: Recent Labs  Lab 02/10/23 1000  AST 22  ALT 15  ALKPHOS 65  BILITOT 1.2  PROT 6.1*  ALBUMIN 2.7*   No results for input(s): "LIPASE", "AMYLASE" in the last 168 hours. No results for input(s): "AMMONIA" in the last 168 hours. Coagulation profile No results for input(s): "INR", "PROTIME" in the last 168 hours.  CBC: Recent Labs  Lab 02/09/23 0748 02/11/23 0958 02/23/2023 0410 02/21/2023 1155 02/13/23 0521  WBC 7.5 10.1 11.3*  --  8.9  NEUTROABS 5.6  --   --   --   --   HGB 8.5* 8.3* 8.5* 8.8*  8.5* 8.2*  HCT 26.6* 25.0* 26.3* 26.0*  25.0* 24.9*  MCV 86.9 84.7 85.4  --  83.3  PLT 131* 115* 174  --  200   Cardiac Enzymes: No results for input(s): "CKTOTAL", "CKMB", "CKMBINDEX", "TROPONINI" in the last 168 hours. BNP (last 3 results) Recent Labs    01/12/23 1127  PROBNP 2,095*   CBG: Recent Labs  Lab 02/11/23 2137 02/06/2023 0723 02/16/2023 1649 02/25/2023 2138 02/13/23 0709  GLUCAP 120* 161* 215* 160* 187*   D-Dimer: No results for input(s): "DDIMER" in the last 72 hours. Hgb A1c: No results for input(s): "HGBA1C" in the last 72 hours. Lipid Profile: No results for input(s): "CHOL", "HDL", "LDLCALC", "TRIG", "CHOLHDL", "LDLDIRECT" in the last 72 hours. Thyroid function studies: No results for input(s): "TSH", "T4TOTAL", "T3FREE", "THYROIDAB" in the last 72 hours.  Invalid input(s): "FREET3" Anemia work up: No results for input(s): "VITAMINB12", "FOLATE", "FERRITIN", "TIBC", "IRON", "RETICCTPCT" in the last 72 hours. Sepsis Labs: Recent Labs  Lab 02/09/23 0748 02/11/23 0958 02/19/2023 0410 02/13/23 0521  PROCALCITON  --   --  2.18  --   WBC 7.5 10.1 11.3* 8.9    Microbiology Recent Results (from the past 240 hour(s))  Resp panel by RT-PCR (RSV, Flu A&B, Covid) Anterior Nasal Swab     Status: None   Collection Time: 02/11/2023  5:58  PM   Specimen: Anterior Nasal Swab  Result Value Ref Range Status   SARS Coronavirus 2 by RT PCR NEGATIVE NEGATIVE Final    Comment: (NOTE) SARS-CoV-2 target nucleic acids are NOT DETECTED.  The SARS-CoV-2 RNA is generally detectable in upper respiratory specimens during the acute phase of infection. The lowest concentration of SARS-CoV-2 viral copies this assay can detect is 138 copies/mL. A negative result does not preclude SARS-Cov-2 infection and should not be used as the sole basis for treatment or other patient management decisions. A negative result may occur with  improper specimen collection/handling, submission of specimen other than nasopharyngeal swab, presence of viral mutation(s) within the areas targeted by this assay, and inadequate number of viral copies(<138 copies/mL). A negative result must be combined with clinical observations, patient history, and epidemiological information. The expected result is Negative.  Fact Sheet for Patients:  EntrepreneurPulse.com.au  Fact Sheet for Healthcare Providers:  IncredibleEmployment.be  This test is no t yet approved or cleared by the [place] FDA and  has been authorized for detection and/or diagnosis of SARS-CoV-2 by FDA under an Emergency Use Authorization (EUA). This EUA will remain  in effect (meaning this test can be used) for the duration of the COVID-19 declaration under Section 564(b)(1) of the Act, 21 U.S.C.section 360bbb-3(b)(1), unless the authorization is terminated  or revoked sooner.       Influenza A by PCR NEGATIVE NEGATIVE Final   Influenza B by PCR NEGATIVE NEGATIVE Final    Comment: (NOTE) The Xpert Xpress SARS-CoV-2/FLU/RSV plus assay is intended as an aid in the diagnosis of influenza from Nasopharyngeal swab specimens and should not be used as a sole basis for treatment. Nasal washings and aspirates are unacceptable for Xpert Xpress  SARS-CoV-2/FLU/RSV testing.  Fact Sheet for Patients: EntrepreneurPulse.com.au  Fact Sheet for Healthcare Providers: IncredibleEmployment.be  This test is not yet approved or cleared by the [place] FDA and has been authorized for detection and/or diagnosis of SARS-CoV-2 by FDA under an Emergency Use Authorization (EUA). This EUA will remain in effect (meaning this test can be used) for the duration of the COVID-19 declaration under Section 564(b)(1) of the Act, 21 U.S.C. section 360bbb-3(b)(1), unless the authorization is terminated or revoked.     Resp Syncytial Virus by PCR NEGATIVE NEGATIVE Final    Comment: (NOTE) Fact Sheet for Patients: EntrepreneurPulse.com.au  Fact Sheet for Healthcare Providers: IncredibleEmployment.be  This test is not yet approved or cleared by the [place] FDA and has been authorized for detection and/or diagnosis of SARS-CoV-2 by FDA under an Emergency Use Authorization (EUA). This EUA will remain in effect (meaning this test can be used) for the duration of the COVID-19 declaration under Section 564(b)(1) of the Act, 21 U.S.C. section 360bbb-3(b)(1), unless the authorization is terminated or revoked.  Performed at [facility], [address] [place], [place] [address]   MRSA Next Gen by PCR, Nasal     Status: None   Collection Time: 02/11/23 11:44 AM   Specimen: Nasal Mucosa; Nasal Swab  Result Value Ref Range Status   MRSA by PCR Next Gen NOT DETECTED  NOT DETECTED Final    Comment: (NOTE) The GeneXpert MRSA Assay (FDA approved for NASAL specimens only), is one component of a comprehensive MRSA colonization surveillance program. It is not intended to diagnose MRSA infection nor to guide or monitor treatment for MRSA infections. Test performance is not FDA approved in patients less than 7 years old. Performed at [facility], [address] [place], [place] [address]     Procedures and diagnostic studies:  DG Chest [facility] 1 View  Result Date: 02/13/2023 CLINICAL DATA:  Congestive heart failure. EXAM: PORTABLE CHEST 1 VIEW COMPARISON:  February 11, 2023. FINDINGS: Stable cardiomediastinal silhouette. Diffuse bilateral lung opacities are noted most consistent with pulmonary edema with associated pleural effusions. Bony thorax is unremarkable. IMPRESSION: Diffuse bilateral pulmonary edema with associated pleural effusions. Electronically Signed   By: Marijo Conception M.D.   On: 02/13/2023 08:15   CARDIAC CATHETERIZATION  Result Date: 02/02/2023 1. Normal filling pressures. 2. Mild-moderate pulmonary arterial hypertension. 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 3. Preserved cardiac output.   DG Chest Port 1 View  Result Date: 02/11/2023 CLINICAL DATA:  Pleural effusion post thoracentesis. EXAM: PORTABLE CHEST 1 VIEW COMPARISON:  February 07, 2023. FINDINGS: Reduction in pleural fluid in the RIGHT chest. Moderately large LEFT pleural effusion may be slightly larger than on the previous radiograph. Trachea midline. Cardiomediastinal contours and hilar structures with persistent cardiac enlargement. EKG leads project over the chest. Interstitial and alveolar opacities persist with improved aeration at the RIGHT lung base. No sign of pneumothorax. On limited assessment no acute skeletal findings. IMPRESSION: No pneumothorax post thoracentesis. Enlarging LEFT effusion. Persistent bilateral interstitial and airspace disease. Findings could reflect a combination of edema and multifocal infection. Electronically Signed   By: Zetta Bills M.D.   On: 02/11/2023 15:11   US THORACENTESIS ASP PLEURAL SPACE W/IMG GUIDE  Result Date: 02/11/2023 INDICATION: Patient with history of stage IV neuroendocrine tumor, AFib on Eliquis who presented complaining of shortness of breath. Patient found to have acute decompensated  diastolic heart failure with persistent pulmonary edema. Attempt to aggressively diuresed patient was unsuccessful and respiratory status continues to decline. Patient placed on BiPAP, transferred to ICU and request received to perform diagnostic and therapeutic thoracentesis for pleural effusion. EXAM: ULTRASOUND GUIDED DIAGNOSTIC AND THERAPEUTIC RIGHT THORACENTESIS MEDICATIONS: 10 mL 1 % lidocaine COMPLICATIONS: None immediate. PROCEDURE: An ultrasound guided thoracentesis was thoroughly discussed with the patient's son and questions answered. The benefits, risks, alternatives and complications were also discussed. The patient understands and wishes to proceed with the procedure. Written consent was obtained. Ultrasound was performed to localize and mark an adequate pocket of fluid in the right chest. The area was then prepped and draped in the normal sterile fashion. 1% Lidocaine was used for local anesthesia. Under ultrasound guidance a 6 Fr Safe-T-Centesis catheter was introduced. Thoracentesis was performed. The catheter was removed and a dressing applied. FINDINGS: A total of approximately 1 L of clear, amber fluid was removed. Samples were sent to the laboratory as requested by the clinical team. IMPRESSION: Successful ultrasound guided right thoracentesis yielding 1 L of pleural fluid. Read by: Narda Rutherford, AGNP-BC Electronically Signed   By: Albin Felling M.D.   On: 02/11/2023 15:03               LOS: 10 days   Tavon Magnussen  [facility]   Pager on [email or web address]. If 7PM-7AM, please contact night-coverage at [email or web address]     02/13/2023,  9:28 AM

## 2023-02-14 DIAGNOSIS — I482 Chronic atrial fibrillation, unspecified: Secondary | ICD-10-CM | POA: Diagnosis not present

## 2023-02-14 DIAGNOSIS — I4821 Permanent atrial fibrillation: Secondary | ICD-10-CM | POA: Diagnosis not present

## 2023-02-14 DIAGNOSIS — I350 Nonrheumatic aortic (valve) stenosis: Secondary | ICD-10-CM

## 2023-02-14 DIAGNOSIS — I5033 Acute on chronic diastolic (congestive) heart failure: Secondary | ICD-10-CM | POA: Diagnosis not present

## 2023-02-14 DIAGNOSIS — J9621 Acute and chronic respiratory failure with hypoxia: Secondary | ICD-10-CM | POA: Diagnosis not present

## 2023-02-14 DIAGNOSIS — N179 Acute kidney failure, unspecified: Secondary | ICD-10-CM | POA: Diagnosis not present

## 2023-02-14 DIAGNOSIS — I272 Pulmonary hypertension, unspecified: Secondary | ICD-10-CM

## 2023-02-14 LAB — CBC
HCT: 23.1 % — ABNORMAL LOW (ref 36.0–46.0)
Hemoglobin: 7.9 g/dL — ABNORMAL LOW (ref 12.0–15.0)
MCH: 27.9 pg (ref 26.0–34.0)
MCHC: 34.2 g/dL (ref 30.0–36.0)
MCV: 81.6 fL (ref 80.0–100.0)
Platelets: 163 10*3/uL (ref 150–400)
RBC: 2.83 MIL/uL — ABNORMAL LOW (ref 3.87–5.11)
RDW: 16 % — ABNORMAL HIGH (ref 11.5–15.5)
WBC: 8 10*3/uL (ref 4.0–10.5)
nRBC: 0 % (ref 0.0–0.2)

## 2023-02-14 LAB — GLUCOSE, CAPILLARY
Glucose-Capillary: 211 mg/dL — ABNORMAL HIGH (ref 70–99)
Glucose-Capillary: 213 mg/dL — ABNORMAL HIGH (ref 70–99)
Glucose-Capillary: 222 mg/dL — ABNORMAL HIGH (ref 70–99)
Glucose-Capillary: 195 mg/dL — ABNORMAL HIGH (ref 70–99)

## 2023-02-14 LAB — BASIC METABOLIC PANEL
Anion gap: 16 — ABNORMAL HIGH (ref 5–15)
BUN: 97 mg/dL — ABNORMAL HIGH (ref 8–23)
CO2: 26 mmol/L (ref 22–32)
Calcium: 8 mg/dL — ABNORMAL LOW (ref 8.9–10.3)
Chloride: 79 mmol/L — ABNORMAL LOW (ref 98–111)
Creatinine, Ser: 3.24 mg/dL — ABNORMAL HIGH (ref 0.44–1.00)
GFR, Estimated: 13 mL/min — ABNORMAL LOW (ref >60)
Glucose, Bld: 220 mg/dL — ABNORMAL HIGH (ref 70–99)
Potassium: 3.7 mmol/L (ref 3.5–5.1)
Sodium: 122 mmol/L — ABNORMAL LOW (ref 135–145)

## 2023-02-14 LAB — PHOSPHORUS: Phosphorus: 4.6 mg/dL (ref 2.5–4.6)

## 2023-02-14 LAB — MAGNESIUM: Magnesium: 1.9 mg/dL (ref 1.7–2.4)

## 2023-02-14 MED ORDER — SODIUM CHLORIDE 0.9 % IV SOLN
1.0000 g | INTRAVENOUS | Status: DC
  Administered 2023-02-15: 1 g via INTRAVENOUS
  Filled 2023-02-14: qty 1

## 2023-02-14 MED ORDER — POTASSIUM CHLORIDE CRYS ER 20 MEQ PO TBCR: 40.0000 meq | EXTENDED_RELEASE_TABLET | Freq: Once | ORAL | Status: DC

## 2023-02-14 MED ORDER — MIDODRINE HCL 5 MG PO TABS
5.0000 mg | ORAL_TABLET | Freq: Three times a day (TID) | ORAL | Status: DC
  Administered 2023-02-14 (×2): 5 mg via ORAL
  Filled 2023-02-14 (×2): qty 1

## 2023-02-14 MED ORDER — POTASSIUM CHLORIDE 10 MEQ/100ML IV SOLN
10.0000 meq | INTRAVENOUS | Status: AC
  Administered 2023-02-14 (×2): 10 meq via INTRAVENOUS
  Filled 2023-02-14 (×2): qty 100

## 2023-02-14 NOTE — Evaluation (Signed)
Physical Therapy Re-Evaluation Patient Details Name: [name] MRN: [id number] DOB: [birth year] Today's Date: 02/14/2023  History of Present Illness  [name] is a 89 year old female with hypertension, hyperlipidemia, atrial fibrillation on Eliquis, CAD, history of a stroke, mild pulmonary hypertension, stage IV neuroendocrine tumor favor carcinoid, on chornic who presents emergency department for chief concerns of shortness of breath.  Clinical Impression  Pt now in CCU with HFNC but eager to work with PT and see what she can do.  She showed great effort but did need consistent cuing for slower/deeper breaths and rest breaks every few reps of LE exercises.  Her SpO2 generally stayed ~90 with drops to mid 80s with increased activity that would slowly come up with rest breaks and focused breathing.  Vitals relatively stable despite increased O2/work load and she was able to tolerate sitting EOB 3-4 minutes, again with relatively stable vitals.  After unsuccessful attempt at standing with RW she was tired and we did return to supine, RN in room much of the session - assists with positioning back to bed.  Pt will benefit from continued PT to address functional limitations, guarded mobility progression per O2 status.      Recommendations for follow up therapy are one component of a multi-disciplinary discharge planning process, led by the attending physician.  Recommendations may be updated based on patient status, additional functional criteria and insurance authorization.  Follow Up Recommendations Skilled nursing-short term rehab (<3 hours/day) Can patient physically be transported by private vehicle: No    Assistance Recommended at Discharge Frequent or constant Supervision/Assistance  Patient can return home with the following  A lot of help with walking and/or transfers;A lot of help with bathing/dressing/bathroom;Assist for transportation;Assistance with cooking/housework     Equipment Recommendations Rolling walker (2 wheels)  Recommendations for Other Services       Functional Status Assessment Patient has had a recent decline in their functional status and demonstrates the ability to make significant improvements in function in a reasonable and predictable amount of time.     Precautions / Restrictions Precautions Precautions: Fall Restrictions Weight Bearing Restrictions: No      Mobility  Bed Mobility Overal bed mobility: Needs Assistance Bed Mobility: Sit to Supine     Supine to sit: Min assist, Mod assist Sit to supine: Mod assist   General bed mobility comments: good effort, unable w/o assist.  Mod assist with trunk and LEs to/from EOB, minA to scoot to edge.    Transfers Overall transfer level: Needs assistance Equipment used: Rolling walker (2 wheels)   Sit to Stand: Max assist           General transfer comment: Pt able to maintain sitting EOB for ~3 minutes with stable SpO2 (88-93% on 40% HFNC) and was eager to try standing.  Ultimately, even with plenty of cuing, help with set up and heavy assist, she did not have the LE strength to clear bottom off bed - pt fatigued, further standing attempts deferred.    Ambulation/Gait                  Stairs            Wheelchair Mobility    Modified Rankin (Stroke Patients Only)       Balance Overall balance assessment: Needs assistance Sitting-balance support: Feet supported Sitting balance-Leahy Scale: Good  Pertinent Vitals/Pain Pain Assessment Pain Assessment: Faces Faces Pain Scale: Hurts a little bit Pain Location: general soreness with mobility    Home Living Family/patient expects to be discharged to:: Unsure Living Arrangements: Alone                      Prior Function                       Hand Dominance        Extremity/Trunk Assessment                 Communication      Cognition Arousal/Alertness: Awake/alert Behavior During Therapy: WFL for tasks assessed/performed Overall Cognitive Status: Within Functional Limits for tasks assessed                                 General Comments: Pt had some difficulty staying consistent with bed exercise reps despite much cuing        General Comments General comments (skin integrity, edema, etc.): On HFNC t/o session, SpO2 generally 85-95% with frequent rest breaks.  HR staying in the 90s-10s, BP consistently low in the 80-90s/50-60s range.    Exercises General Exercises - Lower Extremity Quad Sets: AROM, 10 reps Heel Slides: AROM, Strengthening, 10 reps (2 X 5 with rest break, lightly resisted leg ext per tolerance) Hip ABduction/ADduction: AROM, Strengthening, 10 reps (2 X 5 with rest break, lightly resisted leg ext per tolerance)   Assessment/Plan    PT Assessment Patient needs continued PT services  PT Problem List Decreased strength;Decreased activity tolerance;Decreased balance;Decreased mobility;Decreased knowledge of precautions;Cardiopulmonary status limiting activity       PT Treatment Interventions DME instruction;Therapeutic exercise;Gait training;Stair training;Functional mobility training;Therapeutic activities;Patient/family education;Balance training;Neuromuscular re-education    PT Goals (Current goals can be found in the Care Plan section)  Acute Rehab PT Goals Patient Stated Goal: get breathing better PT Goal Formulation: With patient Time For Goal Achievement: 02/27/23 Potential to Achieve Goals: Fair    Frequency Min 2X/week     Co-evaluation               AM-PAC PT "6 Clicks" Mobility  Outcome Measure Help needed turning from your back to your side while in a flat bed without using bedrails?: A Little Help needed moving from lying on your back to sitting on the side of a flat bed without using bedrails?: A Lot Help needed moving to  and from a bed to a chair (including a wheelchair)?: Total Help needed standing up from a chair using your arms (e.g., wheelchair or bedside chair)?: Total Help needed to walk in hospital room?: Total Help needed climbing 3-5 steps with a railing? : Total 6 Click Score: 9    End of Session Equipment Utilized During Treatment: Oxygen (HFNC 40%) Activity Tolerance: Patient limited by fatigue Patient left: with bed alarm set;with call bell/phone within reach;with nursing/sitter in room Nurse Communication: Mobility status PT Visit Diagnosis: Unsteadiness on feet (R26.81);Muscle weakness (generalized) (M62.81);Difficulty in walking, not elsewhere classified (R26.2);Other abnormalities of gait and mobility (R26.89)    Time: 2979-8921 PT Time Calculation (min) (ACUTE ONLY): 32 min   Charges:   PT Evaluation $PT Re-evaluation: 1 Re-eval PT Treatments $Therapeutic Exercise: 8-22 mins $Therapeutic Activity: 8-22 mins        Kreg Shropshire, DPT 02/14/2023, 1:43 PM

## 2023-02-14 NOTE — Progress Notes (Addendum)
Progress Note    [name]  IOE:[id number] DOB: 08-03-1934  DOA: 02/02/2023 PCP: [name], FNP      Brief Narrative:    Medical records reviewed and are as summarized below:  [name] is a 89 y.o. female with past medical history significant for hypertension, hyperlipidemia, atrial fibrillation on Eliquis, CAD, history of a stroke, mild pulmonary hypertension, stage IV neuroendocrine tumor favor carcinoid, on chornic who presents emergency department for chief concerns of shortness of breath.   Presentation consistent with acute decompensated diastolic heart failure.  Fluid status has been difficult to manage.  Patient has been aggressively diuresed however repeat chest x-ray with persistent pulmonary edema pattern.   2/11: Respiratory status continues to decline.  This morning requiring 15 L high flow nasal cannula with saturations in the low to mid 80s.  Shortness of breath persistent.  Initiated on BiPAP.  Cardiology consulted.   2/12: Shortness of breath persistent.  Patient currently on 10 L high flow nasal cannula.  Required BiPAP for several hours yesterday and then again overnight.  Volume status difficult to ascertain.  Attempting to require a chest CT without contrast.  If pleural effusions patient may require thoracentesis.  Palliative care consult appreciated.  [name]   2/13: Respiratory status remains quite tenuous.  Diuresis limited by cardiorenal syndrome worsening creatinine and hyponatremia.  Heart failure service consulted.  Started on Lasix gtt.     Assessment/Plan:   Principal Problem:   Acute on chronic hypoxic respiratory failure (HCC) Active Problems:   Acute exacerbation of CHF (congestive heart failure) (HCC)   Acute respiratory failure with hypoxia (HCC)   CAD (coronary artery disease)   Insulin dependent type 2 diabetes mellitus (HCC)   Stage 3b  chronic kidney disease (CKD) (HCC)   Carcinoid tumor of lung   HTN (hypertension)   Hypothyroid   Physical deconditioning   AKI (acute kidney injury) ([facility])   Protein-calorie malnutrition, severe   Atrial fibrillation, chronic (HCC)   Hyperlipidemia   Dysuria   Protein-calorie malnutrition, moderate (HCC)   Bilateral pleural effusion   Nutrition Problem: Moderate Malnutrition Etiology: chronic illness (CHF)  Signs/Symptoms: moderate fat depletion, mild fat depletion, mild muscle depletion, moderate muscle depletion   Body mass index is 23.15 kg/m.   Acute on chronic diastolic CHF: S/p right heart cath on 02/22/2023.  Normal filling pressures and mild pulmonary hypertension on RHC.    Monitor BMP, daily weight and urine output.  2D echo showed EF estimated at 65 to 70%, moderate LVH, PASP 50, mild MR, mild to moderate TR, severe aortic stenosis   Bilateral pleural effusion: S/p right-sided thoracentesis on 02/11/2023 with removal of 1 L of pleural fluid.  S/p left-sided thoracentesis on 02/13/2023 with removal of 300 mL of fluid.  Procedure was aborted prematurely because of respiratory distress.   Suspected pneumonia: Procalcitonin is elevated, 2.18.  Continue IV cefepime   Acute on chronic hypoxic respiratory failure: He is on oxygen via heated humidified HFNC, tapered down to FiO2 40% at 40 L/min.  Taper down oxygen as able.   Permanent atrial fibrillation with RVR: She is on IV amiodarone drip.  Continue metoprolol and Eliquis.   Hypotension: Start midodrine.  Monitor BP closely   Hypokalemia: Improved.  Continue potassium repletion.   Hypervolemic hyponatremia: Worsening hyponatremia.  S/p tolvaptan on 02/10/2023.  Monitor sodium level closely.  Continue fluid restriction 1500  mL.   AKI on CKD stage IIIb, cardiorenal syndrome: Worsening AKI.  Consulted nephrologist to assist with placement.    Type II DM: Insulin glargine today because of poor oral intake.  Use  NovoLog as needed for hyperglycemia.   CAD, aortic stenosis: Not a good candidate for TAVR per cardiologist.   Dysuria: Completed 3 days of IV Rocephin.   Other comorbidities include history of stroke, severe protein calorie malnutrition, hypothyroidism, hypertension   Plan of care discussed with [name] and [name].  Discussed worsening kidney function and low urine output.  They wanted to talk to the nephrologist to discuss dialysis.   Diet Order             Diet Heart Room service appropriate? Yes; Fluid consistency: Thin  Diet effective now                            Consultants: Cardiologist Palliative care  Procedures: Right heart cath on 02/19/2023 Right-sided thoracentesis on 02/11/2023 Left-sided thoracentesis 02/13/2023    Medications:    apixaban  2.5 mg Oral BID   benzonatate  200 mg Oral TID   Chlorhexidine Gluconate Cloth  6 each Topical Daily   diclofenac Sodium  4 g Topical QID   feeding supplement  237 mL Oral BID BM   fluticasone  2 spray Each Nare Daily   gabapentin  300 mg Oral BID   insulin aspart  0-5 Units Subcutaneous QHS   insulin aspart  0-9 Units Subcutaneous TID WC   insulin glargine-yfgn  10 Units Subcutaneous Daily   levothyroxine  88 mcg Oral QAC breakfast   metoprolol succinate  25 mg Oral Daily   midodrine  5 mg Oral TID WC   multivitamin with minerals  1 tablet Oral Daily   mouth rinse  15 mL Mouth Rinse 4 times per day   potassium chloride  40 mEq Oral Once   rosuvastatin  20 mg Oral QHS   simethicone  80 mg Oral QID   sodium chloride flush  3 mL Intravenous Q12H   sodium chloride flush  3 mL Intravenous Q12H   Continuous Infusions:  sodium chloride     amiodarone 60 mg/hr (02/14/23 0854)   [START ON 03-14-2023] ceFEPime (MAXIPIME) IV       Anti-infectives (From admission, onward)    Start     Dose/Rate Route Frequency Ordered Stop   03/14/23 0800  ceFEPIme (MAXIPIME) 1 g in sodium chloride 0.9 % 100 mL IVPB         1 g 200 mL/hr over 30 Minutes Intravenous Every 24 hours 02/14/23 1040     02/13/23 0945  ceFEPIme (MAXIPIME) 2 g in sodium chloride 0.9 % 100 mL IVPB  Status:  Discontinued        2 g 200 mL/hr over 30 Minutes Intravenous Every 24 hours 02/13/23 0857 02/14/23 1040   02/04/23 1500  cefTRIAXone (ROCEPHIN) 1 g in sodium chloride 0.9 % 100 mL IVPB        1 g 200 mL/hr over 30 Minutes Intravenous Every 24 hours 02/04/23 1425 02/06/23 1509              Family Communication/Anticipated D/C date and plan/Code Status   DVT prophylaxis: apixaban (ELIQUIS) tablet 2.5 mg Start: 02/13/23 2200 SCD's Start: 02/04/2023 1209 Place TED hose Start: 02/11/2023 2033 apixaban (ELIQUIS) tablet 2.5 mg     Code Status: DNR  Family Communication: Plan discussed with  [name] (son) at the bedside and [name] (son) over the phone Disposition Plan: Plan to discharge to SNF when medically stable   Status is: Inpatient Remains inpatient appropriate because: respiratory failure, worsening AKI      Subjective:   Interval events noted.  She said "I do not know how I feel".  No chest pain or shortness of breath.  [name], son, was at the bedside  Objective:    Vitals:   02/14/23 0600 02/14/23 0700 02/14/23 0751 02/14/23 0800  BP: 95/62 (!) 93/55  (!) 84/51  Pulse: (!) 108 93  88  Resp: (!) 23 (!) 22  (!) 22  Temp:    99.1 F (37.3 C)  TempSrc:    Oral  SpO2: 92% 98% 98% 95%  Weight:      Height:       No data found.   Intake/Output Summary (Last 24 hours) at 02/14/2023 1157 Last data filed at 02/14/2023 0600 Gross per 24 hour  Intake 1110.67 ml  Output 100 ml  Net 1010.67 ml   Filed Weights   02/09/2023 0500 02/13/23 0500 02/14/23 0500  Weight: 54 kg 55.3 kg 57.4 kg    Exam:   GEN: NAD SKIN: Warm and dry EYES: No pallor or icterus ENT: MMM CV: Irregular rate and rhythm, tachycardic PULM: CTA B ABD: soft, ND, NT, +BS CNS: AAO x 3, non focal EXT: No edema or  tenderness     Data Reviewed:   I have personally reviewed following labs and imaging studies:  Labs: Labs show the following:   Basic Metabolic Panel: Recent Labs  Lab 02/10/23 0621 02/10/23 1000 02/10/23 1735 02/11/23 0307 02/11/23 0958 02/18/2023 0410 01/29/2023 1155 02/13/23 0521 02/14/23 0656  NA 126* 126*   < > 129* 129* 132* 131*  128* 129* 122*  K 5.0 4.9  --  4.1  --  4.1 2.8*  2.8* 2.8* 3.7  CL 88* 88*  --  85*  --  85*  --  85* 79*  CO2 28 28  --  31  --  33*  --  31 26  GLUCOSE 253* 219*  --  215*  --  132*  --  172* 220*  BUN 65* 65*  --  77*  --  83*  --  87* 97*  CREATININE 1.89* 1.85*  --  1.85*  --  2.00*  --  2.06* 3.24*  CALCIUM 8.3* 8.4*  --  8.5*  --  8.5*  --  8.1* 8.0*  MG 2.6*  --   --  2.3  --  2.2  --  2.1 1.9  PHOS  --   --   --   --   --   --   --   --  4.6   < > = values in this interval not displayed.   GFR Estimated Creatinine Clearance: 9.9 mL/min (A) (by C-G formula based on SCr of 3.24 mg/dL (H)). Liver Function Tests: Recent Labs  Lab 02/10/23 1000  AST 22  ALT 15  ALKPHOS 65  BILITOT 1.2  PROT 6.1*  ALBUMIN 2.7*   No results for input(s): "LIPASE", "AMYLASE" in the last 168 hours. No results for input(s): "AMMONIA" in the last 168 hours. Coagulation profile No results for input(s): "INR", "PROTIME" in the last 168 hours.  CBC: Recent Labs  Lab 02/09/23 0748 02/11/23 0958 02/01/2023 0410 02/14/2023 1155 02/13/23 0521 02/14/23 0656  WBC 7.5 10.1 11.3*  --  8.9 8.0  NEUTROABS 5.6  --   --   --   --   --  HGB 8.5* 8.3* 8.5* 8.8*  8.5* 8.2* 7.9*  HCT 26.6* 25.0* 26.3* 26.0*  25.0* 24.9* 23.1*  MCV 86.9 84.7 85.4  --  83.3 81.6  PLT 131* 115* 174  --  200 163   Cardiac Enzymes: No results for input(s): "CKTOTAL", "CKMB", "CKMBINDEX", "TROPONINI" in the last 168 hours. BNP (last 3 results) Recent Labs    01/12/23 1127  PROBNP 2,095*   CBG: Recent Labs  Lab 02/13/23 1127 02/13/23 1632 02/13/23 2127  02/14/23 0722 02/14/23 1113  GLUCAP 143* 236* 251* 211* 213*   D-Dimer: No results for input(s): "DDIMER" in the last 72 hours. Hgb A1c: No results for input(s): "HGBA1C" in the last 72 hours. Lipid Profile: No results for input(s): "CHOL", "HDL", "LDLCALC", "TRIG", "CHOLHDL", "LDLDIRECT" in the last 72 hours. Thyroid function studies: No results for input(s): "TSH", "T4TOTAL", "T3FREE", "THYROIDAB" in the last 72 hours.  Invalid input(s): "FREET3" Anemia work up: No results for input(s): "VITAMINB12", "FOLATE", "FERRITIN", "TIBC", "IRON", "RETICCTPCT" in the last 72 hours. Sepsis Labs: Recent Labs  Lab 02/11/23 0958 02/04/2023 0410 02/13/23 0521 02/14/23 0656  PROCALCITON  --  2.18  --   --   WBC 10.1 11.3* 8.9 8.0    Microbiology Recent Results (from the past 240 hour(s))  MRSA Next Gen by PCR, Nasal     Status: None   Collection Time: 02/11/23 11:44 AM   Specimen: Nasal Mucosa; Nasal Swab  Result Value Ref Range Status   MRSA by PCR Next Gen NOT DETECTED NOT DETECTED Final    Comment: (NOTE) The GeneXpert MRSA Assay (FDA approved for NASAL specimens only), is one component of a comprehensive MRSA colonization surveillance program. It is not intended to diagnose MRSA infection nor to guide or monitor treatment for MRSA infections. Test performance is not FDA approved in patients less than 24 years old. Performed at [facility], [address] [place], [place] [address]   Body fluid culture w Gram Stain     Status: None (Preliminary result)   Collection Time: 02/13/23 12:52 PM   Specimen: Pleura; Body Fluid  Result Value Ref Range Status   Specimen Description   Final    PLEURAL Performed at [facility], [address] [place], [place] [address]    Special Requests   Final    PLEURAL Performed at [facility], [address] [place], [place] [address]    Gram Stain   Final    FEW WBC PRESENT,BOTH PMN AND MONONUCLEAR NO  ORGANISMS SEEN    Culture   Final    NO GROWTH < 24 HOURS Performed at [facility], [place] [address], [place], [place] [address]    Report Status PENDING  Incomplete    Procedures and diagnostic studies:  US THORACENTESIS ASP PLEURAL SPACE W/IMG GUIDE  Result Date: 02/13/2023 INDICATION: Left pleural effusion EXAM: ULTRASOUND GUIDED LEFT THORACENTESIS MEDICATIONS: 10 cc 1% lidocaine COMPLICATIONS: None immediate. PROCEDURE: An ultrasound guided thoracentesis was thoroughly discussed with the patient and questions answered. The benefits, risks, alternatives and complications were also discussed. The patient understands and wishes to proceed with the procedure. Written consent was obtained. Ultrasound was performed to localize and mark an adequate pocket of fluid in the left chest. The area was then prepped and draped in the normal sterile fashion. 1% Lidocaine was used for local anesthesia. Under ultrasound guidance a 6 Fr Safe-T-Centesis catheter was introduced. Thoracentesis was performed. The catheter was removed and a dressing applied. FINDINGS: A  total of approximately 300 mL of red tinged fluid was removed. Samples were sent to the laboratory as requested by the clinical team. IMPRESSION: Successful ultrasound guided left thoracentesis yielding 300 mL of pleural fluid. Follow-up chest x-ray revealed no evidence of pneumothorax. Read by: Reatha Armour, PA-C Electronically Signed   By: Jerilynn Mages.  Shick M.D.   On: 02/13/2023 14:23   DG Chest Port 1 View  Result Date: 02/13/2023 CLINICAL DATA:  Status post thoracentesis EXAM: PORTABLE CHEST 1 VIEW COMPARISON:  02/13/2023 FINDINGS: Diffuse bilateral airspace disease. Small bilateral pleural effusions, decreasing on the left since prior study. No pneumothorax following thoracentesis. Heart is normal size. Aortic atherosclerosis. IMPRESSION: Severe diffuse bilateral airspace disease with bilateral effusions. No pneumothorax. Electronically Signed   By:  Rolm Baptise M.D.   On: 02/13/2023 12:59   DG Chest Port 1 View  Result Date: 02/13/2023 CLINICAL DATA:  Congestive heart failure. EXAM: PORTABLE CHEST 1 VIEW COMPARISON:  February 11, 2023. FINDINGS: Stable cardiomediastinal silhouette. Diffuse bilateral lung opacities are noted most consistent with pulmonary edema with associated pleural effusions. Bony thorax is unremarkable. IMPRESSION: Diffuse bilateral pulmonary edema with associated pleural effusions. Electronically Signed   By: Marijo Conception M.D.   On: 02/13/2023 08:15               LOS: 11 days   [facility]   Pager on [email or web address]. If 7PM-7AM, please contact night-coverage at [email or web address]     02/14/2023, 11:57 AM

## 2023-02-14 NOTE — Progress Notes (Signed)
Pharmacy Antibiotic Note  [name] is a 89 y.o. female admitted on 02/25/2023 with ? pneumonia with pleural effusions. Respiratory status on HFNC. In AKI with Scr 2.06 (CrCl 10-30). Thoracentesis 2/16 - 500 mL fluid drained. Pharmacy has been consulted for cefepime dosing.  Scr 2.06 >> 3.24  Plan: Will adjust Cefepime from 2 grams to 1 gram IV every 24 hours for crcl 9.9 ml/min   Height: 5\' 2"  (157.5 cm) Weight: 57.4 kg (126 lb 8.7 oz) IBW/kg (Calculated) : 50.1  Temp (24hrs), Avg:99.1 F (37.3 C), Min:98 F (36.7 C), Max:100 F (37.8 C)  Recent Labs  Lab 02/09/23 0748 02/10/23 0621 02/10/23 1000 02/11/23 0307 02/11/23 0958 02/08/2023 0410 02/13/23 0521 02/14/23 0656  WBC 7.5  --   --   --  10.1 11.3* 8.9 8.0  CREATININE 1.54*   < > 1.85* 1.85*  --  2.00* 2.06* 3.24*   < > = values in this interval not displayed.     Estimated Creatinine Clearance: 9.9 mL/min (A) (by C-G formula based on SCr of 3.24 mg/dL (H)).    Allergies  Allergen Reactions   Codeine Nausea And Vomiting   Hydrocodone-Acetaminophen Nausea And Vomiting    Antimicrobials this admission: Ceftriaxone 2/6 >> 2/9 cefepime 2/16 >>   Dose adjustments this admission: N/a  Microbiology results: 2/16 Pleural culture: in process 2/14 MRSA PCR: not detected  Thank you for allowing pharmacy to be a part of this patient's care.  Truxton Stupka A 02/14/2023 8:53 AM

## 2023-02-14 NOTE — Progress Notes (Signed)
Rounding Note    Patient Name: [name] Date of Encounter: 02/14/2023  [place] Cardiologist: None   Subjective   Shortness of breath, still requiring high flow nasal oxygen, on midodrine due to low blood pressures, goals of care discussions underway, dialysis is being considered.  Renal function worsening over the past 24 hours.  Inpatient Medications    Scheduled Meds:  apixaban  2.5 mg Oral BID   benzonatate  200 mg Oral TID   Chlorhexidine Gluconate Cloth  6 each Topical Daily   diclofenac Sodium  4 g Topical QID   feeding supplement  237 mL Oral BID BM   fluticasone  2 spray Each Nare Daily   gabapentin  300 mg Oral BID   insulin aspart  0-5 Units Subcutaneous QHS   insulin aspart  0-9 Units Subcutaneous TID WC   insulin glargine-yfgn  10 Units Subcutaneous Daily   levothyroxine  88 mcg Oral QAC breakfast   metoprolol succinate  25 mg Oral Daily   midodrine  5 mg Oral TID WC   multivitamin with minerals  1 tablet Oral Daily   mouth rinse  15 mL Mouth Rinse 4 times per day   rosuvastatin  20 mg Oral QHS   simethicone  80 mg Oral QID   sodium chloride flush  3 mL Intravenous Q12H   sodium chloride flush  3 mL Intravenous Q12H   Continuous Infusions:  sodium chloride     amiodarone 60 mg/hr (02/14/23 0854)   [START ON Feb 22, 2023] ceFEPime (MAXIPIME) IV     potassium chloride 10 mEq (02/14/23 1315)   PRN Meds: sodium chloride, acetaminophen, guaiFENesin-dextromethorphan, ipratropium-albuterol, metoprolol tartrate, morphine injection, ondansetron (ZOFRAN) IV, mouth rinse, polyethylene glycol, senna-docusate, sodium chloride flush, traMADol   Vital Signs    Vitals:   02/14/23 0600 02/14/23 0700 02/14/23 0751 02/14/23 0800  BP: 95/62 (!) 93/55  (!) 84/51  Pulse: (!) 108 93  88  Resp: (!) 23 (!) 22  (!) 22  Temp:    99.1 F (37.3 C)  TempSrc:    Oral  SpO2: 92% 98% 98% 95%  Weight:      Height:        Intake/Output Summary (Last 24  hours) at 02/14/2023 1412 Last data filed at 02/14/2023 0600 Gross per 24 hour  Intake 1110.67 ml  Output 100 ml  Net 1010.67 ml      02/14/2023    5:00 AM 02/13/2023    5:00 AM 02/18/2023    5:00 AM  Last 3 Weights  Weight (lbs) 126 lb 8.7 oz 121 lb 14.6 oz 119 lb 0.8 oz  Weight (kg) 57.4 kg 55.3 kg 54 kg      Telemetry    Atrial fibrillation, heart rate 94- Personally Reviewed  ECG     - Personally Reviewed  Physical Exam   GEN: Moderate respiratory distress, appears frail, soft-spoken Neck: No JVD Cardiac: Irregularly irregular, 2/6 systolic murmur Respiratory: Rhonchorous breath sounds bilaterally GI: Soft, nontender, non-distended  MS: No edema; No deformity. Neuro:  Nonfocal  Psych: Normal affect   Labs    High Sensitivity Troponin:   Recent Labs  Lab 02/16/2023 1316 02/02/2023 1902 02/06/23 2157  TROPONINIHS 7 8 10      Chemistry Recent Labs  Lab 02/10/23 1000 02/10/23 1735 02/02/2023 0410 01/31/2023 1155 02/13/23 0521 02/14/23 0656  NA 126*   < > 132* 131*  128* 129* 122*  K 4.9   < > 4.1 2.8*  2.8* 2.8* 3.7  CL 88*   < > 85*  --  85* 79*  CO2 28   < > 33*  --  31 26  GLUCOSE 219*   < > 132*  --  172* 220*  BUN 65*   < > 83*  --  87* 97*  CREATININE 1.85*   < > 2.00*  --  2.06* 3.24*  CALCIUM 8.4*   < > 8.5*  --  8.1* 8.0*  MG  --    < > 2.2  --  2.1 1.9  PROT 6.1*  --   --   --   --   --   ALBUMIN 2.7*  --   --   --   --   --   AST 22  --   --   --   --   --   ALT 15  --   --   --   --   --   ALKPHOS 65  --   --   --   --   --   BILITOT 1.2  --   --   --   --   --   GFRNONAA 26*   < > 24*  --  23* 13*  ANIONGAP 10   < > 14  --  13 16*   < > = values in this interval not displayed.    Lipids No results for input(s): "CHOL", "TRIG", "HDL", "LABVLDL", "LDLCALC", "CHOLHDL" in the last 168 hours.  Hematology Recent Labs  Lab 02/14/2023 0410 02/02/2023 1155 02/13/23 0521 02/14/23 0656  WBC 11.3*  --  8.9 8.0  RBC 3.08*  --  2.99* 2.83*  HGB 8.5*  8.8*  8.5* 8.2* 7.9*  HCT 26.3* 26.0*  25.0* 24.9* 23.1*  MCV 85.4  --  83.3 81.6  MCH 27.6  --  27.4 27.9  MCHC 32.3  --  32.9 34.2  RDW 15.1  --  15.7* 16.0*  PLT 174  --  200 163   Thyroid No results for input(s): "TSH", "FREET4" in the last 168 hours.  BNP Recent Labs  Lab 02/08/23 1016  BNP 293.7*    DDimer  Recent Labs  Lab 02/08/23 1225  DDIMER 6.25*     Radiology    US THORACENTESIS ASP PLEURAL SPACE W/IMG GUIDE  Result Date: 02/13/2023 INDICATION: Left pleural effusion EXAM: ULTRASOUND GUIDED LEFT THORACENTESIS MEDICATIONS: 10 cc 1% lidocaine COMPLICATIONS: None immediate. PROCEDURE: An ultrasound guided thoracentesis was thoroughly discussed with the patient and questions answered. The benefits, risks, alternatives and complications were also discussed. The patient understands and wishes to proceed with the procedure. Written consent was obtained. Ultrasound was performed to localize and mark an adequate pocket of fluid in the left chest. The area was then prepped and draped in the normal sterile fashion. 1% Lidocaine was used for local anesthesia. Under ultrasound guidance a 6 Fr Safe-T-Centesis catheter was introduced. Thoracentesis was performed. The catheter was removed and a dressing applied. FINDINGS: A total of approximately 300 mL of red tinged fluid was removed. Samples were sent to the laboratory as requested by the clinical team. IMPRESSION: Successful ultrasound guided left thoracentesis yielding 300 mL of pleural fluid. Follow-up chest x-ray revealed no evidence of pneumothorax. Read by: Reatha Armour, PA-C Electronically Signed   By: Jerilynn Mages.  Shick M.D.   On: 02/13/2023 14:23   DG Chest Port 1 View  Result Date: 02/13/2023 CLINICAL DATA:  Status post thoracentesis EXAM: PORTABLE CHEST 1  VIEW COMPARISON:  02/13/2023 FINDINGS: Diffuse bilateral airspace disease. Small bilateral pleural effusions, decreasing on the left since prior study. No pneumothorax following  thoracentesis. Heart is normal size. Aortic atherosclerosis. IMPRESSION: Severe diffuse bilateral airspace disease with bilateral effusions. No pneumothorax. Electronically Signed   By: Rolm Baptise M.D.   On: 02/13/2023 12:59   DG Chest Port 1 View  Result Date: 02/13/2023 CLINICAL DATA:  Congestive heart failure. EXAM: PORTABLE CHEST 1 VIEW COMPARISON:  February 11, 2023. FINDINGS: Stable cardiomediastinal silhouette. Diffuse bilateral lung opacities are noted most consistent with pulmonary edema with associated pleural effusions. Bony thorax is unremarkable. IMPRESSION: Diffuse bilateral pulmonary edema with associated pleural effusions. Electronically Signed   By: Marijo Conception M.D.   On: 02/13/2023 08:15    Cardiac Studies   TTE 01/2023 1. Left ventricular ejection fraction, by estimation, is 65 to 70%. The  left ventricle has normal function. The left ventricle has no regional  wall motion abnormalities. There is moderate left ventricular hypertrophy.  Left ventricular diastolic function   could not be evaluated.   2. Right ventricular systolic function is normal. The right ventricular  size is moderately enlarged. There is moderately elevated pulmonary artery  systolic pressure. The estimated right ventricular systolic pressure is  03.7 mmHg.   3. Left atrial size was mildly dilated.   4. Right atrial size was moderately dilated.   5. Moderate pleural effusion in the left lateral region.   6. The mitral valve is degenerative. Mild mitral valve regurgitation.   7. Tricuspid valve regurgitation is mild to moderate.   8. The aortic valve has an indeterminant number of cusps. There is  moderate thickening of the aortic valve. Aortic valve regurgitation is  trivial. Moderate to severe aortic valve stenosis. Aortic valve area, by  VTI measures 0.59 cm. Aortic valve mean  gradient measures 25.4 mmHg. Aortic valve Vmax measures 3.16 m/s.   9. The inferior vena cava is normal in size with  <50% respiratory  variability, suggesting right atrial pressure of 8 mmHg.   [place] 02/09/2023 1. Normal filling pressures.  2. Mild-moderate pulmonary arterial hypertension. 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  3. Preserved cardiac output.     Patient Profile     89 y.o. female with history of HFpEF, moderate pulmonary hypertension, CKD, permanent A-fib, low-flow low gradient aortic stenosis presenting with shortness of breath and respiratory failure, being seen for HFpEF.  Assessment & Plan    HFpEF -Appears euvolemic on exam -Right heart cath 2 days ago with normal filling pressures -Continue to hold IV diuretics due to worsening renal function -Renal replacement therapy being considered.  2.  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  3.  Permanent A-fib -Continue amiodarone drip for heart rate control.  Eliquis.  4.  Moderate to severe aortic stenosis -Not candidate for intervention  5. Worsening renal function -Renal input appreciated.  Overall prognosis remain guarded.  Goals of care discussions underway.  Total encounter time more than 50 minutes  Greater than 50% was spent in counseling and coordination of care with the patient      Signed, Kate Sable, MD  02/14/2023, 2:12 PM

## 2023-02-14 NOTE — IPAL (Signed)
  Interdisciplinary Goals of Care Family Meeting   Date carried out: 02/14/2023  Location of the meeting: Bedside  Member's involved: Physician, Bedside Registered Nurse, and Family Member or next of kin   [place] or acting medical decision maker: [name] (over the phone) and [name] (at the bedside)  Discussion: We discussed goals of care for [name] .  We discussed diagnoses, prognosis and quality of life.  Her kidney function has worsened.  We discussed hemodialysis and how advanced age, low blood pressure and comorbidities might make hemodialysis challenging.  Patient was not sure what to do.  She asked whether she should give up.  Family wants to pursue dialysis at this time and want to discuss this further with the nephrologist.   Code status:   Code Status: DNR   Disposition: Continue current acute care  Time spent for the meeting: 13 minutes    [name], MD  02/14/2023, 11:52 AM

## 2023-02-14 NOTE — Consult Note (Signed)
[facility] CONSULT NOTE    Date: 02/14/2023                  Patient Name:  [name]  MRN: [id number]  DOB: 08-28-1934  Age / Sex: 89 y.o., female         PCP: [name], FNP                 Service Requesting Consult: Hospitalist                 Reason for Consult: Acute kidney injury/chronic kidney disease stage IIIb            History of Present Illness: Patient is a 89 y.o. female with a PMHx of hypertension, hyperlipidemia, atrial fibrillation on anticoagulation with Eliquis, coronary disease, history of CVA, pulmonary hypertension, stage IV neuroendocrine tumor, chronic kidney disease stage IIIb, who was admitted to [facility] on 01/29/2023 for evaluation of acute decompensated diastolic heart failure.  Over the course of hospitalization the patient's respiratory status has declined.  She has been on diuretic therapy however once renal function found to be deteriorating this was stopped.  Patient was on Lasix drip but this was discontinued.  Over the preceding 24 hours urine output has dropped dramatically and down to 100 cc.  BUN currently up to 97 with a creatinine of 3.24.  Patient's 2 sons are at bedside.   Medications: Outpatient medications: Medications Prior to Admission  Medication Sig Dispense Refill Last Dose   acetaminophen (TYLENOL) 325 MG tablet Take 2 tablets (650 mg total) by mouth every 6 (six) hours as needed for mild pain, fever or headache.   unknown   apixaban (ELIQUIS) 2.5 MG TABS tablet Take 1 tablet (2.5 mg total) by mouth 2 (two) times daily. 180 tablet 3 02/24/2023 at am   budesonide (PULMICORT) 0.5 MG/2ML nebulizer solution Take 2 mLs (0.5 mg total) by nebulization 2 (two) times daily.  12 unknown   calcium carbonate (SUPER CALCIUM) 1500 (600 Ca) MG TABS tablet Take 600 mg of elemental calcium by mouth daily with breakfast.   02/09/2023   diltiazem (DILACOR XR) 180 MG 24 hr capsule Take 180 mg by mouth daily.   02/24/2023    furosemide (LASIX) 20 MG tablet Take 1 tablet (20 mg total) by mouth daily. 30 tablet 11 02/25/2023   insulin aspart (NOVOLOG) 100 UNIT/ML injection Inject 0-9 Units into the skin 3 (three) times daily with meals. 10 mL 11 02/02/2023   ipratropium-albuterol (DUONEB) 0.5-2.5 (3) MG/3ML SOLN Take 3 mLs by nebulization every 6 (six) hours as needed. 360 mL  unknown   levothyroxine (SYNTHROID) 88 MCG tablet Take 88 mcg by mouth daily.   02/24/2023   magnesium oxide (MAG-OX) 400 (240 Mg) MG tablet Take 400 mg by mouth daily.   02/18/2023   metoprolol succinate (TOPROL-XL) 25 MG 24 hr tablet Take 25 mg by mouth daily.   02/18/2023   Multiple Vitamin (MULTIVITAMIN WITH MINERALS) TABS tablet Take 1 tablet by mouth daily.   01/31/2023   polyethylene glycol (MIRALAX / GLYCOLAX) 17 g packet Take 17 g by mouth daily as needed for mild constipation. 14 each 0 unknown   rosuvastatin (CRESTOR) 20 MG tablet Take 20 mg by mouth daily.   02/23/2023   sitaGLIPtin (JANUVIA) 50 MG tablet Take 50 mg by mouth daily.   02/11/2023   cefUROXime (CEFTIN) 500 MG tablet Take one tablet by mouth every 12 hours for 7 days. (Patient  not taking: Reported on 02/04/2023)   Not Taking   Fluocinolone Acetonide 0.01 % OIL Place 5 drops in ear(s) daily. (Patient not taking: Reported on 02/04/2023)   Not Taking   Insulin Pen Needle (NOVOFINE PLUS PEN NEEDLE) 32G X 4 MM MISC ONE      lactose free nutrition (BOOST) LIQD Take 237 mLs by mouth daily.      metoprolol succinate (TOPROL-XL) 100 MG 24 hr tablet Take 1 tablet (100 mg total) by mouth daily. Take with or immediately following a meal. (Patient not taking: Reported on 02/04/2023) 90 tablet 1 Not Taking   Skin Protectants, Misc. (EUCERIN) cream Apply 1 Application topically 2 (two) times daily.       Current medications: Current Facility-Administered Medications  Medication Dose Route Frequency Provider Last Rate Last Admin   0.9 %  sodium chloride infusion  250 mL Intravenous PRN Larey Dresser, MD        acetaminophen (TYLENOL) tablet 650 mg  650 mg Oral Q4H PRN Larey Dresser, MD   650 mg at 02/13/23 2042   amiodarone (NEXTERONE PREMIX) 360-4.14 MG/200ML-% (1.8 mg/mL) IV infusion  60 mg/hr Intravenous Continuous Larey Dresser, MD 33.3 mL/hr at 02/14/23 0854 60 mg/hr at 02/14/23 0854   apixaban (ELIQUIS) tablet 2.5 mg  2.5 mg Oral BID Wynelle Cleveland, RPH   2.5 mg at 02/14/23 3762   benzonatate (TESSALON) capsule 200 mg  200 mg Oral TID [name], MD   200 mg at 02/14/23 0857   [START ON 03/10/23] ceFEPIme (MAXIPIME) 1 g in sodium chloride 0.9 % 100 mL IVPB  1 g Intravenous Q24H Chinita Greenland A, RPH       Chlorhexidine Gluconate Cloth 2 % PADS 6 each  6 each Topical Daily [name], MD   6 each at 02/14/23 8315   diclofenac Sodium (VOLTAREN) 1 % topical gel 4 g  4 g Topical QID [name], MD   4 g at 02/14/23 0858   feeding supplement (ENSURE ENLIVE / ENSURE PLUS) liquid 237 mL  237 mL Oral BID BM [name], MD   237 mL at 02/13/23 1448   fluticasone (FLONASE) 50 MCG/ACT nasal spray 2 spray  2 spray Each Nare Daily [name], MD   2 spray at 02/14/23 0858   gabapentin (NEURONTIN) capsule 300 mg  300 mg Oral BID [name], MD   300 mg at 02/14/23 0857   guaiFENesin-dextromethorphan (ROBITUSSIN DM) 100-10 MG/5ML syrup 5 mL  5 mL Oral Q4H PRN [name], MD   5 mL at 02/10/23 2128   insulin aspart (novoLOG) injection 0-5 Units  0-5 Units Subcutaneous QHS [name], MD   3 Units at 02/13/23 2145   insulin aspart (novoLOG) injection 0-9 Units  0-9 Units Subcutaneous TID WC [name], MD   3 Units at 02/14/23 0808   insulin glargine-yfgn (SEMGLEE) injection 10 Units  10 Units Subcutaneous Daily [name], MD   10 Units at 02/14/23 0859   ipratropium-albuterol (DUONEB) 0.5-2.5 (3) MG/3ML nebulizer solution 3 mL  3 mL Nebulization Q6H PRN [name], MD   3 mL at 02/04/23 2034   levothyroxine (SYNTHROID) tablet 88 mcg  88 mcg Oral QAC  breakfast [name], MD   88 mcg at 02/14/23 0559   metoprolol succinate (TOPROL-XL) 24 hr tablet 25 mg  25 mg Oral Daily [name], MD   25 mg at 02/14/23 0901   metoprolol tartrate (LOPRESSOR) injection 2.5 mg  2.5 mg  Intravenous Q2H PRN [name], MD       morphine (PF) 2 MG/ML injection 2 mg  2 mg Intravenous Q3H PRN [name], MD   2 mg at 02/11/23 1761   multivitamin with minerals tablet 1 tablet  1 tablet Oral Daily [name], MD   1 tablet at 02/14/23 0856   ondansetron (ZOFRAN) injection 4 mg  4 mg Intravenous Q6H PRN Larey Dresser, MD       Oral care mouth rinse  15 mL Mouth Rinse 4 times per day [name], MD   15 mL at 02/14/23 0809   Oral care mouth rinse  15 mL Mouth Rinse PRN [name], MD       polyethylene glycol (MIRALAX / GLYCOLAX) packet 17 g  17 g Oral Daily PRN [name], MD   17 g at 02/10/23 2128   rosuvastatin (CRESTOR) tablet 20 mg  20 mg Oral QHS [name], MD   20 mg at 02/13/23 2144   senna-docusate (Senokot-S) tablet 1 tablet  1 tablet Oral QHS PRN [name], MD   1 tablet at 02/10/23 2128   simethicone (MYLICON) chewable tablet 80 mg  80 mg Oral QID [name], MD   80 mg at 02/14/23 0857   sodium chloride flush (NS) 0.9 % injection 3 mL  3 mL Intravenous Q12H [name], MD   3 mL at 02/13/23 2145   sodium chloride flush (NS) 0.9 % injection 3 mL  3 mL Intravenous Q12H Larey Dresser, MD   3 mL at 02/13/23 2145   sodium chloride flush (NS) 0.9 % injection 3 mL  3 mL Intravenous PRN Larey Dresser, MD       traMADol Veatrice Bourbon) tablet 50 mg  50 mg Oral Q6H PRN [name], MD   50 mg at 02/10/23 2128      Allergies: Allergies  Allergen Reactions   Codeine Nausea And Vomiting   Hydrocodone-Acetaminophen Nausea And Vomiting      Past Medical History: Past Medical History:  Diagnosis Date   Acute diverticulitis 06/2017   Anemia 06/2017   CAD (coronary artery disease) 03/13/2012   Diabetes  mellitus    Dysrhythmia    ATRIAL FIB   Fall 07/07/2017   HTN (hypertension) 03/13/2012   Hypothyroid 03/13/2012   Lung cancer ([place])    Shortness of breath    Stroke ([place])    Syncope and collapse 04/02/2016   Type II or unspecified type diabetes mellitus without mention of complication, not stated as uncontrolled 03/13/2012     Past Surgical History: Past Surgical History:  Procedure Laterality Date   ABDOMINAL HYSTERECTOMY     BIOPSY  06/13/2021   Procedure: BIOPSY;  Surgeon: Ronnette Juniper, MD;  Location: [facility];  Service: Gastroenterology;;   BREAST EXCISIONAL BIOPSY Left    BREAST SURGERY     benign tumors removed in [place]  04/20/2012   Procedure: CARDIOVERSION;  Surgeon: Laverda Page, MD;  Location: [facility];  Service: Cardiovascular;  Laterality: N/A;   CARDIOVERSION N/A 05/10/2013   Procedure: CARDIOVERSION;  Surgeon: Laverda Page, MD;  Location: [place];  Service: Cardiovascular;  Laterality: N/A;   CHOLECYSTECTOMY     COLONOSCOPY WITH PROPOFOL N/A 06/13/2021   Procedure: COLONOSCOPY WITH PROPOFOL;  Surgeon: Ronnette Juniper, MD;  Location: [facility];  Service: Gastroenterology;  Laterality: N/A;   ESOPHAGOGASTRODUODENOSCOPY (EGD) WITH PROPOFOL N/A 06/13/2021   Procedure: ESOPHAGOGASTRODUODENOSCOPY (EGD)  WITH PROPOFOL;  Surgeon: Ronnette Juniper, MD;  Location: [facility];  Service: Gastroenterology;  Laterality: N/A;   HEMORRHOID SURGERY     jaw replacement     LEFT HEART CATHETERIZATION WITH CORONARY ANGIOGRAM N/A 04/20/2015   Procedure: LEFT HEART CATHETERIZATION WITH CORONARY ANGIOGRAM;  Surgeon: Adrian Prows, MD;  Location: [facility];  Service: Cardiovascular;  Laterality: N/A;   RIGHT HEART CATH N/A 01/31/2023   Procedure: RIGHT HEART CATH;  Surgeon: Larey Dresser, MD;  Location: [place] [facility];  Service: Cardiovascular;  Laterality: N/A;   tumors     back of head     Family History: Family History   Problem Relation Age of Onset   Breast cancer Mother    Aneurysm Father        Likely thoracic aneurysm per patient   Hypertension Sister    Heart disease Sister    Diabetes type II Sister    Diabetes type II Sister    Diverticulitis Sister    Diabetes type II Sister    Stroke Sister    Heart disease Brother    Melanoma Brother    Melanoma Brother      Social History: Social History   Socioeconomic History   Marital status: Widowed    Spouse name: Not on file   Number of children: 3   Years of education: Not on file   Highest education level: Not on file  Occupational History   Not on file  Tobacco Use   Smoking status: Never   Smokeless tobacco: Never  Vaping Use   Vaping Use: Never used  Substance and Sexual Activity   Alcohol use: No   Drug use: No   Sexual activity: Yes    Birth control/protection: None  Other Topics Concern   Not on file  Social History Narrative   Not on file   Social Determinants of Health   Financial Resource Strain: Not on file  Food Insecurity: No Food Insecurity (02/04/2023)   Hunger Vital Sign    Worried About Running Out of Food in the Last Year: Never true    Ran Out of Food in the Last Year: Never true  Transportation Needs: No Transportation Needs (02/04/2023)   PRAPARE - Hydrologist (Medical): No    Lack of Transportation (Non-Medical): No  Physical Activity: Not on file  Stress: Not on file  Social Connections: Not on file  Intimate Partner Violence: Not At Risk (02/04/2023)   Humiliation, Afraid, Rape, and Kick questionnaire    Fear of Current or Ex-Partner: No    Emotionally Abused: No    Physically Abused: No    Sexually Abused: No     Review of Systems: Review of Systems  Constitutional:  Positive for malaise/fatigue. Negative for chills and fever.  HENT:  Negative for congestion, hearing loss and tinnitus.   Eyes:  Negative for blurred vision and double vision.  Respiratory:   Positive for shortness of breath. Negative for cough and sputum production.   Cardiovascular:  Negative for chest pain, palpitations and orthopnea.  Gastrointestinal:  Negative for diarrhea, nausea and vomiting.  Genitourinary:  Negative for dysuria, frequency and urgency.  Musculoskeletal:  Negative for myalgias.  Skin:  Negative for itching and rash.  Neurological:  Positive for weakness. Negative for dizziness and focal weakness.  Endo/Heme/Allergies:  Negative for polydipsia. Does not bruise/bleed easily.  Psychiatric/Behavioral:  The patient is not nervous/anxious.      Vital Signs:  Blood pressure (!) 84/51, pulse 88, temperature 99.1 F (37.3 C), temperature source Oral, resp. rate (!) 22, height 5\' 2"  (1.575 m), weight 57.4 kg, SpO2 95 %.  Weight trends: Filed Weights   02/20/2023 0500 02/13/23 0500 02/14/23 0500  Weight: 54 kg 55.3 kg 57.4 kg     Physical Exam: General: Critically ill-appearing  Head: Normocephalic, atraumatic. Moist oral mucosal membranes high flow nasal cannula in place  Eyes: Anicteric  Neck: Supple  Lungs:  Basilar rales, on high flow nasal cannula  Heart: S1S2 no rubs  Abdomen:  Soft, nontender, bowel sounds present  Extremities: Trace peripheral edema.  Neurologic: Awake, alert, following commands  Skin: No acute rash  Access: No hemodialysis access    Lab results: Basic Metabolic Panel: Recent Labs  Lab 02/10/23 0621 02/10/23 1000 02/11/23 0307 02/11/23 0958 02/11/2023 0410 02/16/2023 1155 02/13/23 0521 02/14/23 0656  NA 126*   < > 129*   < > 132* 131*  128* 129* 122*  K 5.0   < > 4.1  --  4.1 2.8*  2.8* 2.8* 3.7  CL 88*   < > 85*  --  85*  --  85* 79*  CO2 28   < > 31  --  33*  --  31 26  GLUCOSE 253*   < > 215*  --  132*  --  172* 220*  BUN 65*   < > 77*  --  83*  --  87* 97*  CREATININE 1.89*   < > 1.85*  --  2.00*  --  2.06* 3.24*  CALCIUM 8.3*   < > 8.5*  --  8.5*  --  8.1* 8.0*  MG 2.6*  --  2.3  --  2.2  --  2.1 1.9  PHOS   --   --   --   --   --   --   --  4.6   < > = values in this interval not displayed.    Liver Function Tests: Recent Labs  Lab 02/10/23 1000  AST 22  ALT 15  ALKPHOS 65  BILITOT 1.2  PROT 6.1*  ALBUMIN 2.7*   No results for input(s): "LIPASE", "AMYLASE" in the last 168 hours. No results for input(s): "AMMONIA" in the last 168 hours.  CBC: Recent Labs  Lab 02/09/23 0748 02/11/23 0958 02/14/2023 0410 02/01/2023 1155 02/13/23 0521 02/14/23 0656  WBC 7.5 10.1 11.3*  --  8.9 8.0  NEUTROABS 5.6  --   --   --   --   --   HGB 8.5* 8.3* 8.5* 8.8*  8.5* 8.2* 7.9*  HCT 26.6* 25.0* 26.3* 26.0*  25.0* 24.9* 23.1*  MCV 86.9 84.7 85.4  --  83.3 81.6  PLT 131* 115* 174  --  200 163    Cardiac Enzymes: No results for input(s): "CKTOTAL", "CKMB", "CKMBINDEX", "TROPONINI" in the last 168 hours.  BNP: Invalid input(s): "POCBNP"  CBG: Recent Labs  Lab 02/13/23 0709 02/13/23 1127 02/13/23 1632 02/13/23 2127 02/14/23 0722  GLUCAP 187* 143* 236* 251* 105*    Microbiology: Results for orders placed or performed during the hospital encounter of 02/05/2023  Resp panel by RT-PCR (RSV, Flu A&B, Covid) Anterior Nasal Swab     Status: None   Collection Time: 02/14/2023  5:58 PM   Specimen: Anterior Nasal Swab  Result Value Ref Range Status   SARS Coronavirus 2 by RT PCR NEGATIVE NEGATIVE Final    Comment: (NOTE) SARS-CoV-2 target nucleic acids are  NOT DETECTED.  The SARS-CoV-2 RNA is generally detectable in upper respiratory specimens during the acute phase of infection. The lowest concentration of SARS-CoV-2 viral copies this assay can detect is 138 copies/mL. A negative result does not preclude SARS-Cov-2 infection and should not be used as the sole basis for treatment or other patient management decisions. A negative result may occur with  improper specimen collection/handling, submission of specimen other than nasopharyngeal swab, presence of viral mutation(s) within the areas  targeted by this assay, and inadequate number of viral copies(<138 copies/mL). A negative result must be combined with clinical observations, patient history, and epidemiological information. The expected result is Negative.  Fact Sheet for Patients:  EntrepreneurPulse.com.au  Fact Sheet for Healthcare Providers:  IncredibleEmployment.be  This test is no t yet approved or cleared by the [place] FDA and  has been authorized for detection and/or diagnosis of SARS-CoV-2 by FDA under an Emergency Use Authorization (EUA). This EUA will remain  in effect (meaning this test can be used) for the duration of the COVID-19 declaration under Section 564(b)(1) of the Act, 21 U.S.C.section 360bbb-3(b)(1), unless the authorization is terminated  or revoked sooner.       Influenza A by PCR NEGATIVE NEGATIVE Final   Influenza B by PCR NEGATIVE NEGATIVE Final    Comment: (NOTE) The Xpert Xpress SARS-CoV-2/FLU/RSV plus assay is intended as an aid in the diagnosis of influenza from Nasopharyngeal swab specimens and should not be used as a sole basis for treatment. Nasal washings and aspirates are unacceptable for Xpert Xpress SARS-CoV-2/FLU/RSV testing.  Fact Sheet for Patients: EntrepreneurPulse.com.au  Fact Sheet for Healthcare Providers: IncredibleEmployment.be  This test is not yet approved or cleared by the [place] FDA and has been authorized for detection and/or diagnosis of SARS-CoV-2 by FDA under an Emergency Use Authorization (EUA). This EUA will remain in effect (meaning this test can be used) for the duration of the COVID-19 declaration under Section 564(b)(1) of the Act, 21 U.S.C. section 360bbb-3(b)(1), unless the authorization is terminated or revoked.     Resp Syncytial Virus by PCR NEGATIVE NEGATIVE Final    Comment: (NOTE) Fact Sheet for  Patients: EntrepreneurPulse.com.au  Fact Sheet for Healthcare Providers: IncredibleEmployment.be  This test is not yet approved or cleared by the [place] FDA and has been authorized for detection and/or diagnosis of SARS-CoV-2 by FDA under an Emergency Use Authorization (EUA). This EUA will remain in effect (meaning this test can be used) for the duration of the COVID-19 declaration under Section 564(b)(1) of the Act, 21 U.S.C. section 360bbb-3(b)(1), unless the authorization is terminated or revoked.  Performed at [facility], [address] [place], [place] [address]   MRSA Next Gen by PCR, Nasal     Status: None   Collection Time: 02/11/23 11:44 AM   Specimen: Nasal Mucosa; Nasal Swab  Result Value Ref Range Status   MRSA by PCR Next Gen NOT DETECTED NOT DETECTED Final    Comment: (NOTE) The GeneXpert MRSA Assay (FDA approved for NASAL specimens only), is one component of a comprehensive MRSA colonization surveillance program. It is not intended to diagnose MRSA infection nor to guide or monitor treatment for MRSA infections. Test performance is not FDA approved in patients less than 74 years old. Performed at [facility], [address] [place], [place] [address]   Body fluid culture w Gram Stain     Status: None (Preliminary result)   Collection Time: 02/13/23 12:52 PM   Specimen: Pleura;  Body Fluid  Result Value Ref Range Status   Specimen Description   Final    PLEURAL Performed at [facility], [address] [place], [place] [address]    Special Requests   Final    PLEURAL Performed at [facility], [address] [place], [place] [address]    Gram Stain   Final    FEW WBC PRESENT,BOTH PMN AND MONONUCLEAR NO ORGANISMS SEEN    Culture   Final    NO GROWTH < 24 HOURS Performed at [facility], [place] [address] [place], [place] [address]    Report Status PENDING   Incomplete    Coagulation Studies: No results for input(s): "LABPROT", "INR" in the last 72 hours.  Urinalysis: No results for input(s): "COLORURINE", "LABSPEC", "PHURINE", "GLUCOSEU", "HGBUR", "BILIRUBINUR", "KETONESUR", "PROTEINUR", "UROBILINOGEN", "NITRITE", "LEUKOCYTESUR" in the last 72 hours.  Invalid input(s): "APPERANCEUR"    Imaging: US THORACENTESIS ASP PLEURAL SPACE W/IMG GUIDE  Result Date: 02/13/2023 INDICATION: Left pleural effusion EXAM: ULTRASOUND GUIDED LEFT THORACENTESIS MEDICATIONS: 10 cc 1% lidocaine COMPLICATIONS: None immediate. PROCEDURE: An ultrasound guided thoracentesis was thoroughly discussed with the patient and questions answered. The benefits, risks, alternatives and complications were also discussed. The patient understands and wishes to proceed with the procedure. Written consent was obtained. Ultrasound was performed to localize and mark an adequate pocket of fluid in the left chest. The area was then prepped and draped in the normal sterile fashion. 1% Lidocaine was used for local anesthesia. Under ultrasound guidance a 6 Fr Safe-T-Centesis catheter was introduced. Thoracentesis was performed. The catheter was removed and a dressing applied. FINDINGS: A total of approximately 300 mL of red tinged fluid was removed. Samples were sent to the laboratory as requested by the clinical team. IMPRESSION: Successful ultrasound guided left thoracentesis yielding 300 mL of pleural fluid. Follow-up chest x-ray revealed no evidence of pneumothorax. Read by: Reatha Armour, PA-C Electronically Signed   By: Jerilynn Mages.  Shick M.D.   On: 02/13/2023 14:23   DG Chest Port 1 View  Result Date: 02/13/2023 CLINICAL DATA:  Status post thoracentesis EXAM: PORTABLE CHEST 1 VIEW COMPARISON:  02/13/2023 FINDINGS: Diffuse bilateral airspace disease. Small bilateral pleural effusions, decreasing on the left since prior study. No pneumothorax following thoracentesis. Heart is normal size. Aortic  atherosclerosis. IMPRESSION: Severe diffuse bilateral airspace disease with bilateral effusions. No pneumothorax. Electronically Signed   By: Rolm Baptise M.D.   On: 02/13/2023 12:59   DG Chest Port 1 View  Result Date: 02/13/2023 CLINICAL DATA:  Congestive heart failure. EXAM: PORTABLE CHEST 1 VIEW COMPARISON:  February 11, 2023. FINDINGS: Stable cardiomediastinal silhouette. Diffuse bilateral lung opacities are noted most consistent with pulmonary edema with associated pleural effusions. Bony thorax is unremarkable. IMPRESSION: Diffuse bilateral pulmonary edema with associated pleural effusions. Electronically Signed   By: Marijo Conception M.D.   On: 02/13/2023 08:15   CARDIAC CATHETERIZATION  Result Date: 02/06/2023 1. Normal filling pressures. 2. Mild-moderate pulmonary arterial hypertension. 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 3. Preserved cardiac output.     Assessment & Plan: Pt is a 89 y.o. female with a PMHx of hypertension, hyperlipidemia, atrial fibrillation on anticoagulation with Eliquis, coronary disease, history of CVA, pulmonary hypertension, stage IV neuroendocrine tumor, chronic kidney disease stage IIIb, who was admitted to [facility] on 02/18/2023 for evaluation of acute decompensated diastolic heart failure.   1.  Acute kidney injury/chronic kidney disease stage IIIb baseline EGFR 40.  Acute kidney injury  now secondary to cardiorenal syndrome.  Was on diuretics earlier in the admission but held due to worsening renal function.  We had a long discussion with the patient and her family today.  Blood pressure quite tenuous at the moment.  Patient high risk for further decompensation with renal replacement therapy.  Patient's family would like to consider the possibility of renal placement therapy over the next day or 2.  Palliative care already following.  2.  Acute respiratory failure/acute diastolic heart failure.  Respiratory status quite tenuous.  Patient is DNR  status.  Currently on high flow nasal cannula.  Avoiding diuretics for now given worsening renal function.  3.  Hypotension.  Maintain pressors to maintain a MAP of 65 or greater.  4.  Hyponatremia.  Was given tolvaptan earlier in the admission but advise against further use given underlying cirrhosis which was noted on CT scan.  5.  Overall prognosis quite guarded.

## 2023-02-15 DIAGNOSIS — E43 Unspecified severe protein-calorie malnutrition: Secondary | ICD-10-CM

## 2023-02-15 DIAGNOSIS — I5033 Acute on chronic diastolic (congestive) heart failure: Secondary | ICD-10-CM | POA: Diagnosis not present

## 2023-02-15 DIAGNOSIS — N179 Acute kidney failure, unspecified: Secondary | ICD-10-CM | POA: Diagnosis not present

## 2023-02-15 DIAGNOSIS — E875 Hyperkalemia: Secondary | ICD-10-CM | POA: Diagnosis not present

## 2023-02-15 DIAGNOSIS — J9621 Acute and chronic respiratory failure with hypoxia: Secondary | ICD-10-CM | POA: Diagnosis not present

## 2023-02-15 LAB — CBC
HCT: 22.5 % — ABNORMAL LOW (ref 36.0–46.0)
Hemoglobin: 8 g/dL — ABNORMAL LOW (ref 12.0–15.0)
MCH: 27.9 pg (ref 26.0–34.0)
MCHC: 35.6 g/dL (ref 30.0–36.0)
MCV: 78.4 fL — ABNORMAL LOW (ref 80.0–100.0)
Platelets: 209 10*3/uL (ref 150–400)
RBC: 2.87 MIL/uL — ABNORMAL LOW (ref 3.87–5.11)
RDW: 16.3 % — ABNORMAL HIGH (ref 11.5–15.5)
WBC: 14.3 10*3/uL — ABNORMAL HIGH (ref 4.0–10.5)
nRBC: 0 % (ref 0.0–0.2)

## 2023-02-15 LAB — COMPREHENSIVE METABOLIC PANEL
ALT: 30 U/L (ref 0–44)
AST: 51 U/L — ABNORMAL HIGH (ref 15–41)
Albumin: 2.2 g/dL — ABNORMAL LOW (ref 3.5–5.0)
Alkaline Phosphatase: 75 U/L (ref 38–126)
Anion gap: 16 — ABNORMAL HIGH (ref 5–15)
BUN: 98 mg/dL — ABNORMAL HIGH (ref 8–23)
CO2: 26 mmol/L (ref 22–32)
Calcium: 8.1 mg/dL — ABNORMAL LOW (ref 8.9–10.3)
Chloride: 77 mmol/L — ABNORMAL LOW (ref 98–111)
Creatinine, Ser: 4.07 mg/dL — ABNORMAL HIGH (ref 0.44–1.00)
GFR, Estimated: 10 mL/min — ABNORMAL LOW (ref >60)
Glucose, Bld: 224 mg/dL — ABNORMAL HIGH (ref 70–99)
Potassium: 5.2 mmol/L — ABNORMAL HIGH (ref 3.5–5.1)
Sodium: 119 mmol/L — CL (ref 135–145)
Total Bilirubin: 1.1 mg/dL (ref 0.3–1.2)
Total Protein: 5.6 g/dL — ABNORMAL LOW (ref 6.5–8.1)

## 2023-02-15 LAB — PHOSPHORUS: Phosphorus: 5 mg/dL — ABNORMAL HIGH (ref 2.5–4.6)

## 2023-02-15 LAB — GLUCOSE, CAPILLARY
Glucose-Capillary: 284 mg/dL — ABNORMAL HIGH (ref 70–99)
Glucose-Capillary: 271 mg/dL — ABNORMAL HIGH (ref 70–99)

## 2023-02-15 LAB — MAGNESIUM: Magnesium: 2 mg/dL (ref 1.7–2.4)

## 2023-02-15 LAB — LIPOPROTEIN A (LPA): Lipoprotein (a): 43.8 nmol/L — ABNORMAL HIGH (ref <75.0)

## 2023-02-15 MED ORDER — GABAPENTIN 100 MG PO CAPS: 100.0000 mg | ORAL_CAPSULE | Freq: Two times a day (BID) | ORAL | Status: DC

## 2023-02-15 MED ORDER — PRISMASOL BGK 4/2.5 32-4-2.5 MEQ/L REPLACEMENT SOLN: Status: DC

## 2023-02-15 MED ORDER — MORPHINE SULFATE (PF) 2 MG/ML IV SOLN
2.0000 mg | INTRAVENOUS | Status: DC | PRN
  Administered 2023-02-15: 2 mg via INTRAVENOUS
  Filled 2023-02-15: qty 1

## 2023-02-15 MED ORDER — LORAZEPAM 2 MG/ML IJ SOLN
1.0000 mg | INTRAMUSCULAR | Status: DC | PRN
  Administered 2023-02-15: 1 mg via INTRAVENOUS
  Filled 2023-02-15: qty 1

## 2023-02-15 MED ORDER — ROSUVASTATIN CALCIUM 10 MG PO TABS: 10.0000 mg | ORAL_TABLET | Freq: Every day | ORAL | Status: DC

## 2023-02-15 MED ORDER — HEPARIN SODIUM (PORCINE) 1000 UNIT/ML DIALYSIS: 1000.0000 [IU] | INTRAMUSCULAR | Status: DC | PRN

## 2023-02-15 MED ORDER — PRISMASOL BGK 4/2.5 32-4-2.5 MEQ/L EC SOLN: Status: DC

## 2023-02-15 MED ORDER — SODIUM CHLORIDE 0.9 % IV SOLN
12.5000 mg | Freq: Three times a day (TID) | INTRAVENOUS | Status: DC | PRN
  Filled 2023-02-15: qty 0.5

## 2023-02-15 MED ORDER — HYALURONIDASE HUMAN 150 UNIT/ML IJ SOLN
150.0000 [IU] | Freq: Once | INTRAMUSCULAR | Status: AC
  Administered 2023-02-15: 150 [IU] via SUBCUTANEOUS
  Filled 2023-02-15: qty 1

## 2023-02-15 MED ORDER — SODIUM CHLORIDE 0.9 % IV SOLN
2.0000 g | Freq: Two times a day (BID) | INTRAVENOUS | Status: DC
  Filled 2023-02-15: qty 12.5

## 2023-02-16 DIAGNOSIS — K7460 Unspecified cirrhosis of liver: Secondary | ICD-10-CM | POA: Diagnosis present

## 2023-02-16 NOTE — Telephone Encounter (Signed)
Just an FYI Dr. Erin Fulling pt passed away yesterday

## 2023-02-17 LAB — CYTOLOGY - NON PAP

## 2023-02-17 LAB — BODY FLUID CULTURE W GRAM STAIN: Culture: NO GROWTH

## 2023-02-19 ENCOUNTER — Ambulatory Visit: Payer: Medicare Other | Admitting: Cardiology

## 2023-02-20 LAB — MISC LABCORP TEST (SEND OUT): Labcorp test code: 9985

## 2023-02-25 DIAGNOSIS — J9621 Acute and chronic respiratory failure with hypoxia: Secondary | ICD-10-CM | POA: Diagnosis not present

## 2023-02-25 DIAGNOSIS — J90 Pleural effusion, not elsewhere classified: Secondary | ICD-10-CM | POA: Diagnosis not present

## 2023-02-25 DIAGNOSIS — Z9981 Dependence on supplemental oxygen: Secondary | ICD-10-CM | POA: Diagnosis not present

## 2023-02-25 DIAGNOSIS — C7A8 Other malignant neuroendocrine tumors: Secondary | ICD-10-CM | POA: Diagnosis not present

## 2023-02-27 ENCOUNTER — Encounter: Payer: Medicare Other | Admitting: Family

## 2023-02-27 NOTE — Progress Notes (Signed)
Responded to a pager earlier today when pt was in ICU and met with family and offered a prayer over t and family.   Received a call that pt had passed and visited family to offer my condolences and offer support and compassionate presence. Please call if any further needs arise.

## 2023-02-27 NOTE — Progress Notes (Signed)
CRRT medication review-pharmacy consult:  Recommend change Rosuvastatin from 20mg  to 10 mg at bedtime for CRRT -recommend change Gabapentin from 300mg  BID to 100mg  BID for CRRT -will change Cefepime from 1 gram IV q24h to 2 gm IV q12h for CRRT dosing   Chinita Greenland PharmD Clinical Pharmacist 03/10/2023'

## 2023-02-27 NOTE — Progress Notes (Signed)
Patient passed away with family in room peacefully at 15:47 pm, MD,  Administrative coordinator and charge nurse notified along with [facility] notified

## 2023-02-27 NOTE — Consult Note (Signed)
[facility]  ROUNDING NOTE   Subjective:  The patient's overall status is worsening. Remains on high flow nasal cannula. Patient anuric over the preceding 24 hours. Creatinine now up to 4.07. Serum sodium also worsening at 119. We met with the patient's son [name] at bedside. Explained that dialysis even in the form of CRRT could be detrimental to the patient at this stage. Patient and his family still requesting initiation of dialysis.   Objective:  Vital signs in last 24 hours:  Temp:  [97.6 F (36.4 C)-98.2 F (36.8 C)] 97.6 F (36.4 C) 02-23-23 0800) Pulse Rate:  [90-108] 90 23-Feb-2023 0800) Resp:  [16-31] 24 02/23/23 0800) BP: (88-121)/(55-81) 98/59 02-23-23 0800) SpO2:  [87 %-100 %] 99 % 02/23/2023 0800) FiO2 (%):  [50 %-80 %] 80 % 23-Feb-2023 0732) Weight:  [59.8 kg] 59.8 kg 02/23/2023 0500)  Weight change: 2.4 kg Filed Weights   02/13/23 0500 02/14/23 0500 2023-02-23 0500  Weight: 55.3 kg 57.4 kg 59.8 kg    Intake/Output: I/O last 3 completed shifts: In: 1310.7 [P.O.:250; I.V.:760.7; IV Piggyback:300] Out: 100 [Urine:100]   Intake/Output this shift:  No intake/output data recorded.  Physical Exam: General: Critically ill-appearing  Head: Normocephalic, atraumatic.  High flow nasal cannula in place  Neck: Supple  Lungs:  Scattered rales and rhonchi, on high flow nasal cannula  Heart: S1S2 irregular  Abdomen:  Soft, nontender, bowel sounds present  Extremities: Trace peripheral edema.  Neurologic: Somnolent but arousable  Skin: No acute rash  Access: No hemodialysis access    Basic Metabolic Panel: Recent Labs  Lab 02/11/23 0307 02/11/23 0958 02/18/2023 0410 02/18/2023 1155 02/13/23 0521 02/14/23 0656 02-23-2023 0431  NA 129*   < > 132* 131*  128* 129* 122* 119*  K 4.1  --  4.1 2.8*  2.8* 2.8* 3.7 5.2*  CL 85*  --  85*  --  85* 79* 77*  CO2 31  --  33*  --  31 26 26   GLUCOSE 215*  --  132*  --  172* 220* 224*  BUN 77*  --  83*  --  87* 97* 98*  CREATININE  1.85*  --  2.00*  --  2.06* 3.24* 4.07*  CALCIUM 8.5*  --  8.5*  --  8.1* 8.0* 8.1*  MG 2.3  --  2.2  --  2.1 1.9 2.0  PHOS  --   --   --   --   --  4.6 5.0*   < > = values in this interval not displayed.    Liver Function Tests: Recent Labs  Lab 02/10/23 1000 Feb 23, 2023 0431  AST 22 51*  ALT 15 30  ALKPHOS 65 75  BILITOT 1.2 1.1  PROT 6.1* 5.6*  ALBUMIN 2.7* 2.2*   No results for input(s): "LIPASE", "AMYLASE" in the last 168 hours. No results for input(s): "AMMONIA" in the last 168 hours.  CBC: Recent Labs  Lab 02/09/23 0748 02/11/23 0958 02/22/2023 0410 02/11/2023 1155 02/13/23 0521 02/14/23 0656 2023/02/23 0431  WBC 7.5 10.1 11.3*  --  8.9 8.0 14.3*  NEUTROABS 5.6  --   --   --   --   --   --   HGB 8.5* 8.3* 8.5* 8.8*  8.5* 8.2* 7.9* 8.0*  HCT 26.6* 25.0* 26.3* 26.0*  25.0* 24.9* 23.1* 22.5*  MCV 86.9 84.7 85.4  --  83.3 81.6 78.4*  PLT 131* 115* 174  --  200 163 209    Cardiac Enzymes: No results for  input(s): "CKTOTAL", "CKMB", "CKMBINDEX", "TROPONINI" in the last 168 hours.  BNP: Invalid input(s): "POCBNP"  CBG: Recent Labs  Lab 02/14/23 0722 02/14/23 1113 02/14/23 1629 02/14/23 2113 20-Feb-2023 0751  GLUCAP 211* 213* 222* 195* 284*    Microbiology: Results for orders placed or performed during the hospital encounter of 01/30/2023  Resp panel by RT-PCR (RSV, Flu A&B, Covid) Anterior Nasal Swab     Status: None   Collection Time: 01/29/2023  5:58 PM   Specimen: Anterior Nasal Swab  Result Value Ref Range Status   SARS Coronavirus 2 by RT PCR NEGATIVE NEGATIVE Final    Comment: (NOTE) SARS-CoV-2 target nucleic acids are NOT DETECTED.  The SARS-CoV-2 RNA is generally detectable in upper respiratory specimens during the acute phase of infection. The lowest concentration of SARS-CoV-2 viral copies this assay can detect is 138 copies/mL. A negative result does not preclude SARS-Cov-2 infection and should not be used as the sole basis for treatment or other  patient management decisions. A negative result may occur with  improper specimen collection/handling, submission of specimen other than nasopharyngeal swab, presence of viral mutation(s) within the areas targeted by this assay, and inadequate number of viral copies(<138 copies/mL). A negative result must be combined with clinical observations, patient history, and epidemiological information. The expected result is Negative.  Fact Sheet for Patients:  EntrepreneurPulse.com.au  Fact Sheet for Healthcare Providers:  IncredibleEmployment.be  This test is no t yet approved or cleared by the [place] FDA and  has been authorized for detection and/or diagnosis of SARS-CoV-2 by FDA under an Emergency Use Authorization (EUA). This EUA will remain  in effect (meaning this test can be used) for the duration of the COVID-19 declaration under Section 564(b)(1) of the Act, 21 U.S.C.section 360bbb-3(b)(1), unless the authorization is terminated  or revoked sooner.       Influenza A by PCR NEGATIVE NEGATIVE Final   Influenza B by PCR NEGATIVE NEGATIVE Final    Comment: (NOTE) The Xpert Xpress SARS-CoV-2/FLU/RSV plus assay is intended as an aid in the diagnosis of influenza from Nasopharyngeal swab specimens and should not be used as a sole basis for treatment. Nasal washings and aspirates are unacceptable for Xpert Xpress SARS-CoV-2/FLU/RSV testing.  Fact Sheet for Patients: EntrepreneurPulse.com.au  Fact Sheet for Healthcare Providers: IncredibleEmployment.be  This test is not yet approved or cleared by the [place] FDA and has been authorized for detection and/or diagnosis of SARS-CoV-2 by FDA under an Emergency Use Authorization (EUA). This EUA will remain in effect (meaning this test can be used) for the duration of the COVID-19 declaration under Section 564(b)(1) of the Act, 21 U.S.C. section  360bbb-3(b)(1), unless the authorization is terminated or revoked.     Resp Syncytial Virus by PCR NEGATIVE NEGATIVE Final    Comment: (NOTE) Fact Sheet for Patients: EntrepreneurPulse.com.au  Fact Sheet for Healthcare Providers: IncredibleEmployment.be  This test is not yet approved or cleared by the [place] FDA and has been authorized for detection and/or diagnosis of SARS-CoV-2 by FDA under an Emergency Use Authorization (EUA). This EUA will remain in effect (meaning this test can be used) for the duration of the COVID-19 declaration under Section 564(b)(1) of the Act, 21 U.S.C. section 360bbb-3(b)(1), unless the authorization is terminated or revoked.  Performed at [facility], [address] [place], [place] [address]   MRSA Next Gen by PCR, Nasal     Status: None   Collection Time: 02/11/23 11:44 AM   Specimen: Nasal Mucosa; Nasal  Swab  Result Value Ref Range Status   MRSA by PCR Next Gen NOT DETECTED NOT DETECTED Final    Comment: (NOTE) The GeneXpert MRSA Assay (FDA approved for NASAL specimens only), is one component of a comprehensive MRSA colonization surveillance program. It is not intended to diagnose MRSA infection nor to guide or monitor treatment for MRSA infections. Test performance is not FDA approved in patients less than 60 years old. Performed at [facility], [address] [place], [place] [address]   Body fluid culture w Gram Stain     Status: None (Preliminary result)   Collection Time: 02/13/23 12:52 PM   Specimen: Pleura; Body Fluid  Result Value Ref Range Status   Specimen Description   Final    PLEURAL Performed at [facility], [address] [place], [place] [address]    Special Requests   Final    PLEURAL Performed at [facility], [address] [place], [place] [address]    Gram Stain   Final    FEW WBC PRESENT,BOTH PMN AND MONONUCLEAR NO ORGANISMS  SEEN    Culture   Final    NO GROWTH 2 DAYS Performed at [facility], [place] [address], [place], [place] [address]    Report Status PENDING  Incomplete    Coagulation Studies: No results for input(s): "LABPROT", "INR" in the last 72 hours.  Urinalysis: No results for input(s): "COLORURINE", "LABSPEC", "PHURINE", "GLUCOSEU", "HGBUR", "BILIRUBINUR", "KETONESUR", "PROTEINUR", "UROBILINOGEN", "NITRITE", "LEUKOCYTESUR" in the last 72 hours.  Invalid input(s): "APPERANCEUR"    Imaging: US THORACENTESIS ASP PLEURAL SPACE W/IMG GUIDE  Result Date: 02/13/2023 INDICATION: Left pleural effusion EXAM: ULTRASOUND GUIDED LEFT THORACENTESIS MEDICATIONS: 10 cc 1% lidocaine COMPLICATIONS: None immediate. PROCEDURE: An ultrasound guided thoracentesis was thoroughly discussed with the patient and questions answered. The benefits, risks, alternatives and complications were also discussed. The patient understands and wishes to proceed with the procedure. Written consent was obtained. Ultrasound was performed to localize and mark an adequate pocket of fluid in the left chest. The area was then prepped and draped in the normal sterile fashion. 1% Lidocaine was used for local anesthesia. Under ultrasound guidance a 6 Fr Safe-T-Centesis catheter was introduced. Thoracentesis was performed. The catheter was removed and a dressing applied. FINDINGS: A total of approximately 300 mL of red tinged fluid was removed. Samples were sent to the laboratory as requested by the clinical team. IMPRESSION: Successful ultrasound guided left thoracentesis yielding 300 mL of pleural fluid. Follow-up chest x-ray revealed no evidence of pneumothorax. Read by: Reatha Armour, PA-C Electronically Signed   By: Jerilynn Mages.  Shick M.D.   On: 02/13/2023 14:23   DG Chest Port 1 View  Result Date: 02/13/2023 CLINICAL DATA:  Status [name] thoracentesis EXAM: PORTABLE CHEST 1 VIEW COMPARISON:  02/13/2023 FINDINGS: Diffuse bilateral airspace disease.  Small bilateral pleural effusions, decreasing on the left since prior study. No pneumothorax following thoracentesis. Heart is normal size. Aortic atherosclerosis. IMPRESSION: Severe diffuse bilateral airspace disease with bilateral effusions. No pneumothorax. Electronically Signed   By: Rolm Baptise M.D.   On: 02/13/2023 12:59     Medications:     prismasol BGK 4/2.5      prismasol BGK 4/2.5     sodium chloride     amiodarone 60 mg/hr (2023/03/05 1015)   ceFEPime (MAXIPIME) IV     prismasol BGK 4/2.5     promethazine (PHENERGAN) injection (IM or IVPB)      apixaban  2.5 mg Oral BID  benzonatate  200 mg Oral TID   Chlorhexidine Gluconate Cloth  6 each Topical Daily   diclofenac Sodium  4 g Topical QID   feeding supplement  237 mL Oral BID BM   fluticasone  2 spray Each Nare Daily   gabapentin  100 mg Oral BID   insulin aspart  0-5 Units Subcutaneous QHS   insulin aspart  0-9 Units Subcutaneous TID WC   insulin glargine-yfgn  10 Units Subcutaneous Daily   levothyroxine  88 mcg Oral QAC breakfast   metoprolol succinate  25 mg Oral Daily   midodrine  5 mg Oral TID WC   multivitamin with minerals  1 tablet Oral Daily   mouth rinse  15 mL Mouth Rinse 4 times per day   rosuvastatin  10 mg Oral QHS   simethicone  80 mg Oral QID   sodium chloride flush  3 mL Intravenous Q12H   sodium chloride flush  3 mL Intravenous Q12H   sodium chloride, acetaminophen, guaiFENesin-dextromethorphan, heparin, ipratropium-albuterol, metoprolol tartrate, morphine injection, ondansetron (ZOFRAN) IV, mouth rinse, polyethylene glycol, promethazine (PHENERGAN) injection (IM or IVPB), senna-docusate, sodium chloride flush, traMADol  Assessment/ Plan:  89 y.o. female with a PMHx of hypertension, hyperlipidemia, atrial fibrillation on anticoagulation with Eliquis, coronary disease, history of CVA, pulmonary hypertension, stage IV neuroendocrine tumor, chronic kidney disease stage IIIb, who was admitted to [facility] on  02/14/2023 for evaluation of acute decompensated diastolic heart failure.    1.  Acute kidney injury/chronic kidney disease stage IIIb baseline EGFR 40.  Acute kidney injury now secondary to cardiorenal syndrome.  Was on diuretics earlier in the admission but held due to worsening renal function.  -Unfortunately patient's renal function has continued to worsen.  She is anuric at this time and creatinine up to 4.07.  We met with the patient's son [name] at bedside.  We explained that given multiorgan failure with underlying heart failure, cirrhosis, acute respiratory failure and now with acute renal failure her prognosis is very guarded.  We also expressed that dialysis could potentially cause further decline including up to death.  The patient's son understands this and would like to still proceed with renal placement therapy in the form of CRRT.  I have discussed this with the critical care team and further input from them still pending.  2.  Acute respiratory failure/acute diastolic heart failure.  Continues to be on high flow nasal cannula.  Overall she appears to be declining.  Patient is DNR status.  3.  Hypotension.  Patient still with relative hypotension.  If CRRT is pursued she would likely need pressors to help maintain pressure.  4.  Hyponatremia.  Likely due to underlying heart failure, renal failure, and cirrhosis.  5.  As before patient with extremely guarded prognosis.   LOS: 12 Kyjuan Gause 03-06-202411:13 AM

## 2023-02-27 NOTE — Progress Notes (Signed)
0730 MD notified of Na 119 and K 5.2 with no urine output.Patient has been belching until vomiting when awake this morning (and throughout night per night shift), Zofran not due at this time. MD to place new orders. MD made aware patient is unable to take PO meds at this time.  7939 IV team consulted to evaluate IVs and possibly place new access, suspected possible infiltration, requested IV team to come assess and place new IV. No other access at this time. IV team made aware of current medications running and access needed to give phenergan.   0900 IV team at bedside. New access placed. Removed IVs per IV team instructions. MD aware of situation.   [place] notified of suspected infiltration, medication ordered. Will give per orders.  1200 Patient made comfort care. Medications given per orders. Patient is resting comfortably in bed.  Report given to RN.

## 2023-02-27 NOTE — Progress Notes (Addendum)
Progress Note    [name]  WPY:[id number] DOB: 03/27/1934  DOA: 01/29/2023 PCP: [name], FNP      Brief Narrative:    Medical records reviewed and are as summarized below:  [name] is a 89 y.o. female with past medical history significant for hypertension, hyperlipidemia, atrial fibrillation on Eliquis, CAD, history of a stroke, mild pulmonary hypertension, stage IV neuroendocrine tumor favor carcinoid, on chornic who presents emergency department for chief concerns of shortness of breath.   Presentation consistent with acute decompensated diastolic heart failure.  Fluid status has been difficult to manage.  Patient has been aggressively diuresed however repeat chest x-ray with persistent pulmonary edema pattern.   2/11: Respiratory status continues to decline.  This morning requiring 15 L high flow nasal cannula with saturations in the low to mid 80s.  Shortness of breath persistent.  Initiated on BiPAP.  Cardiology consulted.   2/12: Shortness of breath persistent.  Patient currently on 10 L high flow nasal cannula.  Required BiPAP for several hours yesterday and then again overnight.  Volume status difficult to ascertain.  Attempting to require a chest CT without contrast.  If pleural effusions patient may require thoracentesis.  Palliative care consult appreciated.  [name]   2/13: Respiratory status remains quite tenuous.  Diuresis limited by cardiorenal syndrome worsening creatinine and hyponatremia.  Heart failure service consulted.  Started on Lasix gtt.     Assessment/Plan:   Principal Problem:   Acute on chronic hypoxic respiratory failure (HCC) Active Problems:   Acute on chronic heart failure with preserved ejection fraction (HCC)   Acute respiratory failure with hypoxia (HCC)   CAD (coronary artery disease)   Insulin dependent type 2 diabetes mellitus (HCC)    Stage 3b chronic kidney disease (CKD) (HCC)   Carcinoid tumor of lung   HTN (hypertension)   Hypothyroid   Physical deconditioning   AKI (acute kidney injury) ([place])   Protein-calorie malnutrition, severe   Atrial fibrillation, chronic (HCC)   Hyperlipidemia   Dysuria   Protein-calorie malnutrition, moderate (HCC)   Bilateral pleural effusion   Pulmonary hypertension, unspecified (HCC)   Aortic valve stenosis   Hyperkalemia   Nutrition Problem: Moderate Malnutrition Etiology: chronic illness (CHF)  Signs/Symptoms: moderate fat depletion, mild fat depletion, mild muscle depletion, moderate muscle depletion   Body mass index is 24.11 kg/m.   Acute on chronic diastolic CHF, severe aortic stenosis: S/p right heart cath on 02/18/2023.  Normal filling pressures and mild pulmonary hypertension on RHC.  2D echo showed EF estimated at 65 to 70%, moderate LVH, PASP 50, mild MR, mild to moderate TR, severe aortic stenosis   Bilateral pleural effusion: S/p right-sided thoracentesis on 02/11/2023 with removal of 1 L of pleural fluid.  S/p left-sided thoracentesis on 02/13/2023 with removal of 300 mL of fluid.  Procedure was aborted prematurely because of respiratory distress.   Suspected pneumonia: Procalcitonin is elevated, 2.18.  Discontinue IV cefepime because she is now on comfort care   Acute on chronic hypoxic respiratory failure: Wean down oxygen as able for comfort   Permanent atrial fibrillation with RVR: Discontinue IV amiodarone, Eliquis and metoprolol   Hypotension: Discontinue midodrine   Hypokalemia: Improved.    Hypervolemic hyponatremia: Worsening hyponatremia.  S/p tolvaptan on 02/10/2023.  Comfort measures   AKI on CKD stage IIIb, hyperkalemia, cardiorenal syndrome: Worsening AKI.  Comfort measures   Type  II DM with hyperglycemia: Discontinue insulin.  Liberalize diet for comfort   CAD, aortic stenosis: Not a good candidate for TAVR per  cardiologist.   Dysuria: Completed 3 days of IV Rocephin.   Other comorbidities include history of stroke, severe protein calorie malnutrition, hypothyroidism, hypertension   Plan discussed with Antony Salmon and [name] at the bedside.  We discussed diagnoses, prognosis and goals of care.  They have collectively made the decision to switch patient to comfort measures. Case discussed with David Stall, RN, Dr. Holley Raring, nephrologist and intensivist, Dr. Gwendalyn Ege.    Diet Order             Diet regular Room service appropriate? Yes; Fluid consistency: Thin  Diet effective now                            Consultants: Cardiologist Palliative care  Procedures: Right heart cath on 02/09/2023 Right-sided thoracentesis on 02/11/2023 Left-sided thoracentesis 02/13/2023    Medications:    Chlorhexidine Gluconate Cloth  6 each Topical Daily   mouth rinse  15 mL Mouth Rinse 4 times per day   sodium chloride flush  3 mL Intravenous Q12H   sodium chloride flush  3 mL Intravenous Q12H   Continuous Infusions:  sodium chloride     promethazine (PHENERGAN) injection (IM or IVPB)       Anti-infectives (From admission, onward)    Start     Dose/Rate Route Frequency Ordered Stop   03-10-23 1200  ceFEPIme (MAXIPIME) 2 g in sodium chloride 0.9 % 100 mL IVPB  Status:  Discontinued        2 g 200 mL/hr over 30 Minutes Intravenous Every 12 hours 03/10/2023 1043 March 10, 2023 1143   2023-03-10 0800  ceFEPIme (MAXIPIME) 1 g in sodium chloride 0.9 % 100 mL IVPB  Status:  Discontinued        1 g 200 mL/hr over 30 Minutes Intravenous Every 24 hours 02/14/23 1040 03-10-23 1043   02/13/23 0945  ceFEPIme (MAXIPIME) 2 g in sodium chloride 0.9 % 100 mL IVPB  Status:  Discontinued        2 g 200 mL/hr over 30 Minutes Intravenous Every 24 hours 02/13/23 0857 02/14/23 1040   02/04/23 1500  cefTRIAXone (ROCEPHIN) 1 g in sodium chloride 0.9 % 100 mL IVPB        1 g 200 mL/hr over 30 Minutes Intravenous Every 24  hours 02/04/23 1425 02/06/23 1509              Family Communication/Anticipated D/C date and plan/Code Status   DVT prophylaxis:      Code Status: DNR  Family Communication: Plan discussed with Antony Salmon and [name] at the bedside Disposition Plan: Anticipate death in the hospital   Status is: Inpatient Remains inpatient appropriate because: Comfort care     Subjective:   Interval events noted.  She is unable to provide any history.  She admits to feeling short of breath by shaking her head.  David Stall, RN, was at the bedside.  Dr. Holley Raring, nephrologist, was at the bedside   Objective:    Vitals:   2023-03-10 0500 Mar 10, 2023 0700 03/10/2023 0732 2023/03/10 0800  BP: 107/60 105/61  (!) 98/59  Pulse: 93 94  90  Resp: (!) 24 (!) 25  (!) 24  Temp:    97.6 F (36.4 C)  TempSrc:    Axillary  SpO2: 90% 91% 91% 99%  Weight: 59.8 kg  Height:       No data found.   Intake/Output Summary (Last 24 hours) at 02/21/23 1221 Last data filed at 02-21-23 0800 Gross per 24 hour  Intake 200 ml  Output 0 ml  Net 200 ml   Filed Weights   02/13/23 0500 02/14/23 0500 2023/02/21 0500  Weight: 55.3 kg 57.4 kg 59.8 kg    Exam:  GEN: NAD SKIN: Warm and dry EYES: No pallor or icterus ENT: MMM CV: Irregular rate and rhythm PULM: CTA B ABD: soft, ND, NT, +BS CNS: Drowsy but arousable EXT: No edema or tenderness    Data Reviewed:   I have personally reviewed following labs and imaging studies:  Labs: Labs show the following:   Basic Metabolic Panel: Recent Labs  Lab 02/11/23 0307 02/11/23 0958 02/04/2023 0410 02/07/2023 1155 02/13/23 0521 02/14/23 0656 2023-02-21 0431  NA 129*   < > 132* 131*  128* 129* 122* 119*  K 4.1  --  4.1 2.8*  2.8* 2.8* 3.7 5.2*  CL 85*  --  85*  --  85* 79* 77*  CO2 31  --  33*  --  31 26 26   GLUCOSE 215*  --  132*  --  172* 220* 224*  BUN 77*  --  83*  --  87* 97* 98*  CREATININE 1.85*  --  2.00*  --  2.06* 3.24* 4.07*  CALCIUM 8.5*   --  8.5*  --  8.1* 8.0* 8.1*  MG 2.3  --  2.2  --  2.1 1.9 2.0  PHOS  --   --   --   --   --  4.6 5.0*   < > = values in this interval not displayed.   GFR Estimated Creatinine Clearance: 7.8 mL/min (A) (by C-G formula based on SCr of 4.07 mg/dL (H)). Liver Function Tests: Recent Labs  Lab 02/10/23 1000 February 21, 2023 0431  AST 22 51*  ALT 15 30  ALKPHOS 65 75  BILITOT 1.2 1.1  PROT 6.1* 5.6*  ALBUMIN 2.7* 2.2*   No results for input(s): "LIPASE", "AMYLASE" in the last 168 hours. No results for input(s): "AMMONIA" in the last 168 hours. Coagulation profile No results for input(s): "INR", "PROTIME" in the last 168 hours.  CBC: Recent Labs  Lab 02/09/23 0748 02/11/23 0958 02/11/2023 0410 02/08/2023 1155 02/13/23 0521 02/14/23 0656 21-Feb-2023 0431  WBC 7.5 10.1 11.3*  --  8.9 8.0 14.3*  NEUTROABS 5.6  --   --   --   --   --   --   HGB 8.5* 8.3* 8.5* 8.8*  8.5* 8.2* 7.9* 8.0*  HCT 26.6* 25.0* 26.3* 26.0*  25.0* 24.9* 23.1* 22.5*  MCV 86.9 84.7 85.4  --  83.3 81.6 78.4*  PLT 131* 115* 174  --  200 163 209   Cardiac Enzymes: No results for input(s): "CKTOTAL", "CKMB", "CKMBINDEX", "TROPONINI" in the last 168 hours. BNP (last 3 results) Recent Labs    01/12/23 1127  PROBNP 2,095*   CBG: Recent Labs  Lab 02/14/23 1113 02/14/23 1629 02/14/23 2113 02/21/2023 0751 02/21/23 1149  GLUCAP 213* 222* 195* 284* 271*   D-Dimer: No results for input(s): "DDIMER" in the last 72 hours. Hgb A1c: No results for input(s): "HGBA1C" in the last 72 hours. Lipid Profile: No results for input(s): "CHOL", "HDL", "LDLCALC", "TRIG", "CHOLHDL", "LDLDIRECT" in the last 72 hours. Thyroid function studies: No results for input(s): "TSH", "T4TOTAL", "T3FREE", "THYROIDAB" in the last 72 hours.  Invalid input(s): "FREET3" Anemia  work up: No results for input(s): "VITAMINB12", "FOLATE", "FERRITIN", "TIBC", "IRON", "RETICCTPCT" in the last 72 hours. Sepsis Labs: Recent Labs  Lab 02/24/2023 0410  02/13/23 0521 02/14/23 0656 2023/03/04 0431  PROCALCITON 2.18  --   --   --   WBC 11.3* 8.9 8.0 14.3*    Microbiology Recent Results (from the past 240 hour(s))  MRSA Next Gen by PCR, Nasal     Status: None   Collection Time: 02/11/23 11:44 AM   Specimen: Nasal Mucosa; Nasal Swab  Result Value Ref Range Status   MRSA by PCR Next Gen NOT DETECTED NOT DETECTED Final    Comment: (NOTE) The GeneXpert MRSA Assay (FDA approved for NASAL specimens only), is one component of a comprehensive MRSA colonization surveillance program. It is not intended to diagnose MRSA infection nor to guide or monitor treatment for MRSA infections. Test performance is not FDA approved in patients less than 75 years old. Performed at [facility], [address] [place], [place] [address]   Body fluid culture w Gram Stain     Status: None (Preliminary result)   Collection Time: 02/13/23 12:52 PM   Specimen: Pleura; Body Fluid  Result Value Ref Range Status   Specimen Description   Final    PLEURAL Performed at [facility], [address] [place], [place] [address]    Special Requests   Final    PLEURAL Performed at [facility], [address] [place], [place] [address]    Gram Stain   Final    FEW WBC PRESENT,BOTH PMN AND MONONUCLEAR NO ORGANISMS SEEN    Culture   Final    NO GROWTH 2 DAYS Performed at [facility], [place] [address] [place], [place] [address]    Report Status PENDING  Incomplete    Procedures and diagnostic studies:  US THORACENTESIS ASP PLEURAL SPACE W/IMG GUIDE  Result Date: 02/13/2023 INDICATION: Left pleural effusion EXAM: ULTRASOUND GUIDED LEFT THORACENTESIS MEDICATIONS: 10 cc 1% lidocaine COMPLICATIONS: None immediate. PROCEDURE: An ultrasound guided thoracentesis was thoroughly discussed with the patient and questions answered. The benefits, risks, alternatives and complications were also discussed. The patient understands and wishes  to proceed with the procedure. Written consent was obtained. Ultrasound was performed to localize and mark an adequate pocket of fluid in the left chest. The area was then prepped and draped in the normal sterile fashion. 1% Lidocaine was used for local anesthesia. Under ultrasound guidance a 6 Fr Safe-T-Centesis catheter was introduced. Thoracentesis was performed. The catheter was removed and a dressing applied. FINDINGS: A total of approximately 300 mL of red tinged fluid was removed. Samples were sent to the laboratory as requested by the clinical team. IMPRESSION: Successful ultrasound guided left thoracentesis yielding 300 mL of pleural fluid. Follow-up chest x-ray revealed no evidence of pneumothorax. Read by: Reatha Armour, PA-C Electronically Signed   By: Jerilynn Mages.  Shick M.D.   On: 02/13/2023 14:23   DG Chest Port 1 View  Result Date: 02/13/2023 CLINICAL DATA:  Status post thoracentesis EXAM: PORTABLE CHEST 1 VIEW COMPARISON:  02/13/2023 FINDINGS: Diffuse bilateral airspace disease. Small bilateral pleural effusions, decreasing on the left since prior study. No pneumothorax following thoracentesis. Heart is normal size. Aortic atherosclerosis. IMPRESSION: Severe diffuse bilateral airspace disease with bilateral effusions. No pneumothorax. Electronically Signed   By: Rolm Baptise M.D.   On: 02/13/2023 12:59               LOS: 12 days   Makyah Lavigne  Benford Asch  [employer], advertising on [email or web address]. If 7PM-7AM, please contact night-coverage at [email or web address]     2023-03-05, 12:21 PM

## 2023-02-27 NOTE — Death Summary Note (Addendum)
DEATH SUMMARY   Patient Details  Name: [name] MRN: [id number] DOB: [birth year] [id number], [place] Reader, FNP Admission/Discharge Information   Admit Date:  03/02/23  Date of Death: Date of Death: Mar 14, 2023  Time of Death: Time of Death: April 03, 1746  Length of Stay: 2023/04/06   Principle Cause of death: Congestive heart failure  Hospital Diagnoses: Principal Problem:   Acute on chronic heart failure with preserved ejection fraction (HCC) Active Problems:   Acute respiratory failure with hypoxia (HCC)   CAD (coronary artery disease)   Insulin dependent type 2 diabetes mellitus (HCC)   Stage 3b chronic kidney disease (CKD) ([place])   Carcinoid tumor of lung   HTN (hypertension)   Hypothyroid   Physical deconditioning   AKI (acute kidney injury) ([place])   Protein-calorie malnutrition, severe   Atrial fibrillation, chronic (HCC)   Acute on chronic hypoxic respiratory failure (HCC)   Hyperlipidemia   Dysuria   Protein-calorie malnutrition, moderate (HCC)   Bilateral pleural effusion   Pulmonary hypertension, unspecified (HCC)   Aortic valve stenosis   Hyperkalemia   Liver cirrhosis [facility])   Hospital Course:  Ms. NAKINA SPATZ was an 89 y.o. female with past medical history significant for hypertension, hyperlipidemia, atrial fibrillation on Eliquis, CAD, history of a stroke, mild pulmonary hypertension, stage IV neuroendocrine tumor favor carcinoid, aortic stenosis, chronic hypoxic respiratory failure, CKD stage IIIb, type II DM, hypothyroidism.  She presented to the hospital because of shortness of breath.     She was admitted to the hospital for acute on chronic diastolic CHF and acute on chronic hypoxic respiratory failure. She was treated with IV Lasix boluses and IV Lasix infusion.  However, aggressive diuresis was limited by acute kidney injury and hypotension.  She underwent right heart cath which showed normal filling pressures and mild pulmonary hypertension.  She developed  bilateral pleural effusion and underwent right and left thoracentesis.  She required BiPAP for acute hypoxic respiratory failure.  She was then transitioned to oxygen therapy via heated humidified high flow nasal cannula.  It became difficult to wean down oxygen.  She developed worsening acute kidney injury and hyperkalemia even after she had been off of diuretics for some days.  She also developed worsening hyponatremia that was attributed to advanced CHF.  She was deemed not to be a good surgical candidate for TAVR for severe aortic stenosis.   Nephrologist was consulted to assist with management.  Initially, her family was contemplating hemodialysis.  However, given progressive multiorgan failure, frailty and persistent hypotension, she was deemed not to be a good candidate for hemodialysis.  She also had liver cirrhosis.  Her family requested that patient be transitioned to comfort care.  She was switched to comfort care.  Unfortunately, her condition worsened and she expired 2023/03/14 at 5:47 PM.      Procedures: Right heart cath, bilateral thoracentesis  Consultations: Cardiologist, nephrologist, palliative care  The results of significant diagnostics from this hospitalization (including imaging, microbiology, ancillary and laboratory) are listed below for reference.   Significant Diagnostic Studies: US THORACENTESIS ASP PLEURAL SPACE W/IMG GUIDE  Result Date: 02/13/2023 INDICATION: Left pleural effusion EXAM: ULTRASOUND GUIDED LEFT THORACENTESIS MEDICATIONS: 10 cc 1% lidocaine COMPLICATIONS: None immediate. PROCEDURE: An ultrasound guided thoracentesis was thoroughly discussed with the patient and questions answered. The benefits, risks, alternatives and complications were also discussed. The patient understands and wishes to proceed with the procedure. Written consent was obtained. Ultrasound was performed to localize and mark an adequate pocket of fluid  in the left chest. The area was then  prepped and draped in the normal sterile fashion. 1% Lidocaine was used for local anesthesia. Under ultrasound guidance a 6 Fr Safe-T-Centesis catheter was introduced. Thoracentesis was performed. The catheter was removed and a dressing applied. FINDINGS: A total of approximately 300 mL of red tinged fluid was removed. Samples were sent to the laboratory as requested by the clinical team. IMPRESSION: Successful ultrasound guided left thoracentesis yielding 300 mL of pleural fluid. Follow-up chest x-ray revealed no evidence of pneumothorax. Read by: Reatha Armour, PA-C Electronically Signed   By: Jerilynn Mages.  Shick M.D.   On: 02/13/2023 14:23   DG Chest Port 1 View  Result Date: 02/13/2023 CLINICAL DATA:  Status post thoracentesis EXAM: PORTABLE CHEST 1 VIEW COMPARISON:  02/13/2023 FINDINGS: Diffuse bilateral airspace disease. Small bilateral pleural effusions, decreasing on the left since prior study. No pneumothorax following thoracentesis. Heart is normal size. Aortic atherosclerosis. IMPRESSION: Severe diffuse bilateral airspace disease with bilateral effusions. No pneumothorax. Electronically Signed   By: Rolm Baptise M.D.   On: 02/13/2023 12:59   DG Chest Port 1 View  Result Date: 02/13/2023 CLINICAL DATA:  Congestive heart failure. EXAM: PORTABLE CHEST 1 VIEW COMPARISON:  February 11, 2023. FINDINGS: Stable cardiomediastinal silhouette. Diffuse bilateral lung opacities are noted most consistent with pulmonary edema with associated pleural effusions. Bony thorax is unremarkable. IMPRESSION: Diffuse bilateral pulmonary edema with associated pleural effusions. Electronically Signed   By: Marijo Conception M.D.   On: 02/13/2023 08:15   CARDIAC CATHETERIZATION  Result Date: 02/18/2023 1. Normal filling pressures. 2. Mild-moderate pulmonary arterial hypertension. 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 3. Preserved cardiac output.   DG Chest Port 1 View  Result Date: 02/11/2023 CLINICAL  DATA:  Pleural effusion post thoracentesis. EXAM: PORTABLE CHEST 1 VIEW COMPARISON:  February 07, 2023. FINDINGS: Reduction in pleural fluid in the RIGHT chest. Moderately large LEFT pleural effusion may be slightly larger than on the previous radiograph. Trachea midline. Cardiomediastinal contours and hilar structures with persistent cardiac enlargement. EKG leads project over the chest. Interstitial and alveolar opacities persist with improved aeration at the RIGHT lung base. No sign of pneumothorax. On limited assessment no acute skeletal findings. IMPRESSION: No pneumothorax post thoracentesis. Enlarging LEFT effusion. Persistent bilateral interstitial and airspace disease. Findings could reflect a combination of edema and multifocal infection. Electronically Signed   By: Zetta Bills M.D.   On: 02/11/2023 15:11   US THORACENTESIS ASP PLEURAL SPACE W/IMG GUIDE  Result Date: 02/11/2023 INDICATION: Patient with history of stage IV neuroendocrine tumor, AFib on Eliquis who presented complaining of shortness of breath. Patient found to have acute decompensated diastolic heart failure with persistent pulmonary edema. Attempt to aggressively diuresed patient was unsuccessful and respiratory status continues to decline. Patient placed on BiPAP, transferred to ICU and request received to perform diagnostic and therapeutic thoracentesis for pleural effusion. EXAM: ULTRASOUND GUIDED DIAGNOSTIC AND THERAPEUTIC RIGHT THORACENTESIS MEDICATIONS: 10 mL 1 % lidocaine COMPLICATIONS: None immediate. PROCEDURE: An ultrasound guided thoracentesis was thoroughly discussed with the patient's son and questions answered. The benefits, risks, alternatives and complications were also discussed. The patient understands and wishes to proceed with the procedure. Written consent was obtained. Ultrasound was performed to localize and mark an adequate pocket of fluid in the right chest. The area was then prepped and draped in the normal  sterile fashion. 1% Lidocaine was used for local anesthesia. Under ultrasound guidance a 6 Fr Safe-T-Centesis catheter was introduced.  Thoracentesis was performed. The catheter was removed and a dressing applied. FINDINGS: A total of approximately 1 L of clear, amber fluid was removed. Samples were sent to the laboratory as requested by the clinical team. IMPRESSION: Successful ultrasound guided right thoracentesis yielding 1 L of pleural fluid. Read by: Narda Rutherford, AGNP-BC Electronically Signed   By: Albin Felling M.D.   On: 02/11/2023 15:03   ECHOCARDIOGRAM COMPLETE  Result Date: 02/10/2023    ECHOCARDIOGRAM REPORT   Patient Name:   LEGEND PECORE Date of Exam: 02/10/2023 Medical Rec #:  [id number]        Height:       62.0 in Accession #:    [id number]       Weight:       127.2 lb Date of Birth:  1934-09-09        BSA:          1.577 m Patient Age:    40 years         BP:           101/62 mmHg Patient Gender: F                HR:           92 bpm. Exam Location:  [facility] Procedure: 2D Echo, Cardiac Doppler, Color Doppler and Intracardiac            Opacification Agent Indications:     CHF-acute diastolic K24.09  History:         Patient has prior history of Echocardiogram examinations, most                  recent 11/13/2022. CAD, Stroke, Signs/Symptoms:Shortness of                  Breath; Risk Factors:Diabetes.  Sonographer:     Sherrie Sport Referring Phys:  [place] Diagnosing Phys: Nelva Bush MD  Sonographer Comments: Suboptimal apical window. IMPRESSIONS  1. Left ventricular ejection fraction, by estimation, is 65 to 70%. The left ventricle has normal function. The left ventricle has no regional wall motion abnormalities. There is moderate left ventricular hypertrophy. Left ventricular diastolic function  could not be evaluated.  2. Right ventricular systolic function is normal. The right ventricular size is moderately enlarged. There is moderately elevated pulmonary artery systolic  pressure. The estimated right ventricular systolic pressure is 73.5 mmHg.  3. Left atrial size was mildly dilated.  4. Right atrial size was moderately dilated.  5. Moderate pleural effusion in the left lateral region.  6. The mitral valve is degenerative. Mild mitral valve regurgitation.  7. Tricuspid valve regurgitation is mild to moderate.  8. The aortic valve has an indeterminant number of cusps. There is moderate thickening of the aortic valve. Aortic valve regurgitation is trivial. Moderate to severe aortic valve stenosis. Aortic valve area, by VTI measures 0.59 cm. Aortic valve mean gradient measures 25.4 mmHg. Aortic valve Vmax measures 3.16 m/s.  9. The inferior vena cava is normal in size with <50% respiratory variability, suggesting right atrial pressure of 8 mmHg. Comparison(s): A prior study was performed on 11/12/2022. Aortic valve gradients are higher and are now in the moderate-severe range (moderate by mean gradient, severe by valve area and dimensionless index). FINDINGS  Left Ventricle: Left ventricular ejection fraction, by estimation, is 65 to 70%. The left ventricle has normal function. The left ventricle has no regional wall motion abnormalities. Definity contrast agent was given IV to  delineate the left ventricular  endocardial borders. The left ventricular internal cavity size was normal in size. There is moderate left ventricular hypertrophy. Left ventricular diastolic function could not be evaluated due to atrial fibrillation. Left ventricular diastolic function  could not be evaluated. Right Ventricle: The right ventricular size is moderately enlarged. No increase in right ventricular wall thickness. Right ventricular systolic function is normal. There is moderately elevated pulmonary artery systolic pressure. The tricuspid regurgitant  velocity is 3.24 m/s, and with an assumed right atrial pressure of 8 mmHg, the estimated right ventricular systolic pressure is 67.6 mmHg. Left Atrium:  Left atrial size was mildly dilated. Right Atrium: Right atrial size was moderately dilated. Pericardium: There is no evidence of pericardial effusion. Mitral Valve: The mitral valve is degenerative in appearance. There is mild thickening of the mitral valve leaflet(s). Mild mitral annular calcification. Mild mitral valve regurgitation. Tricuspid Valve: The tricuspid valve is normal in structure. Tricuspid valve regurgitation is mild to moderate. Aortic Valve: The aortic valve has an indeterminant number of cusps. There is moderate thickening of the aortic valve. Aortic valve regurgitation is trivial. Moderate to severe aortic stenosis is present. Aortic valve mean gradient measures 25.4 mmHg. Aortic valve peak gradient measures 40.0 mmHg. Aortic valve area, by VTI measures 0.59 cm. Pulmonic Valve: The pulmonic valve was normal in structure. Pulmonic valve regurgitation is mild. No evidence of pulmonic stenosis. Aorta: The aortic root is normal in size and structure. Pulmonary Artery: The pulmonary artery is of normal size. Venous: The inferior vena cava is normal in size with less than 50% respiratory variability, suggesting right atrial pressure of 8 mmHg. IAS/Shunts: No atrial level shunt detected by color flow Doppler. Additional Comments: There is a moderate pleural effusion in the left lateral region.  LEFT VENTRICLE PLAX 2D LVIDd:         3.20 cm LVIDs:         2.20 cm LV PW:         1.50 cm LV IVS:        1.30 cm LVOT diam:     1.89 cm LV SV:         35 LV SV Index:   22 LVOT Area:     2.81 cm  RIGHT VENTRICLE RV Basal diam:  4.40 cm RV Mid diam:    3.80 cm RV S prime:     10.30 cm/s TAPSE (M-mode): 1.6 cm LEFT ATRIUM             Index        RIGHT ATRIUM           Index LA diam:        4.00 cm 2.54 cm/m   RA Area:     23.00 cm LA Vol (A2C):   57.0 ml 36.14 ml/m  RA Volume:   71.90 ml  45.59 ml/m LA Vol (A4C):   61.3 ml 38.87 ml/m LA Biplane Vol: 59.0 ml 37.41 ml/m  AORTIC VALVE                      PULMONIC VALVE AV Area (Vmax):    0.60 cm      PR End Diast Vel: 9.12 msec AV Area (Vmean):   0.58 cm AV Area (VTI):     0.59 cm AV Vmax:           316.40 cm/s AV Vmean:          234.600 cm/s AV VTI:  0.602 m AV Peak Grad:      40.0 mmHg AV Mean Grad:      25.4 mmHg LVOT Vmax:         67.90 cm/s LVOT Vmean:        48.800 cm/s LVOT VTI:          0.126 m LVOT/AV VTI ratio: 0.21  AORTA Ao Root diam: 3.10 cm MITRAL VALVE                TRICUSPID VALVE MV Area (PHT): 4.74 cm     TR Peak grad:   42.0 mmHg MV Decel Time: 160 msec     TR Vmax:        324.00 cm/s MV E velocity: 146.00 cm/s                             SHUNTS                             Systemic VTI:  0.13 m                             Systemic Diam: 1.89 cm Nelva Bush MD Electronically signed by Nelva Bush MD Signature Date/Time: 02/10/2023/3:31:44 PM    Final    CT CHEST WO CONTRAST  Result Date: 02/09/2023 CLINICAL DATA:  Chronic shortness of breath. EXAM: CT CHEST WITHOUT CONTRAST TECHNIQUE: Multidetector CT imaging of the chest was performed following the standard protocol without IV contrast. RADIATION DOSE REDUCTION: This exam was performed according to the departmental dose-optimization program which includes automated exposure control, adjustment of the mA and/or kV according to patient size and/or use of iterative reconstruction technique. COMPARISON:  11/12/2022 and 07/08/2017. FINDINGS: Cardiovascular: Atherosclerotic calcification of the aorta, aortic valve and coronary arteries. Enlarged pulmonic trunk and heart. No pericardial effusion. Mediastinum/Nodes: Mediastinal lymph nodes measure up to 15 mm in the low right paratracheal station, increased slightly from 10 mm. Hilar regions are difficult to evaluate without IV contrast. No axillary adenopathy. Esophagus is grossly unremarkable. Lungs/Pleura: Diffuse centrilobular ground-glass and septal thickening with large, partially loculated pleural effusions.  Collapse/consolidation in both lower lobes. 8 mm left upper lobe nodule (3/48), unchanged from 07/08/2017. Airway is unremarkable. Upper Abdomen: Liver margin is slightly irregular. Visualized portions of the liver, adrenal glands, spleen pancreas and stomach are otherwise grossly unremarkable. Musculoskeletal: Degenerative changes in the spine. Upper and lower thoracic compression deformities, unchanged. IMPRESSION: 1. Congestive heart failure. Pleural effusions are partially loculated. 2. Probable compressive atelectasis in both lower lobes. Difficult to exclude superimposed pneumonia. 3. Mediastinal adenopathy, likely reactive. 4. 8 mm lingular nodule, stable from 07/08/2017. Additional pulmonary nodules seen on prior studies are relatively obscured on today's study in this patient with biopsy-proven low-grade neuroendocrine tumor. Recommend continued attention on follow-up. 5. Cirrhosis. 6. Aortic atherosclerosis (ICD10-I70.0). Coronary artery calcification. 7. Enlarged pulmonic trunk, indicative of pulmonary arterial hypertension. Electronically Signed   By: Lorin Picket M.D.   On: 02/09/2023 16:42   DG Chest Port 1 View  Result Date: 02/07/2023 CLINICAL DATA:  Hypoxia EXAM: PORTABLE CHEST 1 VIEW COMPARISON:  02/05/2023 FINDINGS: Bilateral small pleural effusions. Bilateral perihilar interstitial and alveolar airspace opacities. No pneumothorax. Stable cardiomediastinal silhouette. Heart and mediastinal contours are unremarkable. No acute osseous abnormality. 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 IMPRESSION: 1. Findings concerning for pulmonary edema. Electronically  Signed   By: Kathreen Devoid M.D.   On: 02/07/2023 13:34   DG Wrist Complete Right  Result Date: 02/07/2023 CLINICAL DATA:  Right wrist pain in the scaphoid region. EXAM: RIGHT WRIST - COMPLETE 3+ VIEW COMPARISON:  Right wrist radiographs 11/22/2022 and right wrist CT 11/23/2022 FINDINGS: Chondrocalcinosis and  moderate degenerative changes at the wrist are similar to the prior studies. No acute fracture, dislocation, or destructive osseous process is identified. The bones are osteopenic. There is mild soft tissue swelling about the wrist. IMPRESSION: No acute osseous abnormality identified.  Chronic findings as above. Electronically Signed   By: Logan Bores M.D.   On: 02/07/2023 10:13   DG Chest Port 1 View  Result Date: 02/05/2023 CLINICAL DATA:  Hypoxia EXAM: PORTABLE CHEST 1 VIEW COMPARISON:  Portable exam 1022 hours compared to 02/04/2023 FINDINGS: Enlargement of cardiac silhouette with pulmonary vascular congestion. Atherosclerotic calcification aorta. Bibasilar atelectasis and pleural effusions. Scattered interstitial infiltrates consistent with pulmonary edema. Overall pattern is similar to the previous study. No new segmental consolidation or pneumothorax. Bones demineralized with BILATERAL glenohumeral degenerative changes. IMPRESSION: Persistent pulmonary edema/CHF with bibasilar effusions and atelectasis. Aortic Atherosclerosis (ICD10-I70.0). Electronically Signed   By: Lavonia Dana M.D.   On: 02/05/2023 10:48   DG Chest 2 View  Result Date: 02/19/2023 CLINICAL DATA:  Shortness of breath and weakness. EXAM: CHEST - 2 VIEW COMPARISON:  01/07/2023; 12/01/2022; 11/23/2022 FINDINGS: Grossly unchanged cardiac silhouette and mediastinal contours with atherosclerotic plaque within the thoracic aorta. Pulmonary vasculature is less distinct than present examination with cephalization of flow. Interval increase in small to moderate-sized bilateral pleural effusions with worsening bibasilar heterogeneous/consolidative opacities. Fluid is now seen tracking within the right minor and bilateral major fissures. No pneumothorax. No acute osseous abnormalities. IMPRESSION: Findings most suggestive of pulmonary edema with interval increase in small to moderate-sized bilateral effusions and worsening bibasilar  heterogeneous/consolidative opacities, likely atelectasis. Electronically Signed   By: Sandi Mariscal M.D.   On: 02/25/2023 13:47   US THYROID  Result Date: 01/20/2023 CLINICAL DATA:  Multinodular goiter EXAM: THYROID ULTRASOUND TECHNIQUE: Ultrasound examination of the thyroid gland and adjacent soft tissues was performed. COMPARISON:  None available FINDINGS: Parenchymal Echotexture: Moderately heterogeneous Isthmus: 0.2 cm Right lobe: 1.7 x 0.8 x 0.7 cm Left lobe: 2.2 x 0.6 x 0.8 cm _________________________________________________________ Estimated total number of nodules >/= 1 cm: 0 Number of spongiform nodules >/=  2 cm not described below (TR1): 0 Number of mixed cystic and solid nodules >/= 1.5 cm not described below (TR2): 0 _________________________________________________________ No discrete nodules are seen within the thyroid gland. IMPRESSION: Atrophic, heterogeneous thyroid gland without discrete nodule. The above is in keeping with the ACR TI-RADS recommendations - J Am Coll Radiol 2017;14:587-595. Electronically Signed   By: Miachel Roux M.D.   On: 01/20/2023 12:40    Microbiology: Recent Results (from the past 240 hour(s))  MRSA Next Gen by PCR, Nasal     Status: None   Collection Time: 02/11/23 11:44 AM   Specimen: Nasal Mucosa; Nasal Swab  Result Value Ref Range Status   MRSA by PCR Next Gen NOT DETECTED NOT DETECTED Final    Comment: (NOTE) The GeneXpert MRSA Assay (FDA approved for NASAL specimens only), is one component of a comprehensive MRSA colonization surveillance program. It is not intended to diagnose MRSA infection nor to guide or monitor treatment for MRSA infections. Test performance is not FDA approved in patients less than 85 years old. Performed at [facility],  [place], [place] [address]   Body fluid culture w Gram Stain     Status: None (Preliminary result)   Collection Time: 02/13/23 12:52 PM   Specimen: Pleura; Body Fluid  Result  Value Ref Range Status   Specimen Description   Final    PLEURAL Performed at [facility], [address] [place], [place] [address]    Special Requests   Final    PLEURAL Performed at [facility], [address] [place], [place] [address]    Gram Stain   Final    FEW WBC PRESENT,BOTH PMN AND MONONUCLEAR NO ORGANISMS SEEN    Culture   Final    NO GROWTH 3 DAYS Performed at [facility], [place] [address] [place], [place] [address]    Report Status PENDING  Incomplete    Time spent: 42 minutes  Signed: [name], MD March 15, 2023

## 2023-02-27 NOTE — Progress Notes (Signed)
Patient vomiting and given Zofran IV as needed. Patient oxygenation at 86%-89% on high flow. Patient's FI02 increased to 28.

## 2023-02-27 NOTE — Progress Notes (Signed)
Pharmacy Antibiotic Note  [name] is a 89 y.o. female admitted on 02/19/2023 with ? pneumonia with pleural effusions. Respiratory status on HFNC. In AKI with Scr 2.06 (CrCl 10-30). Thoracentesis 2/16 - 500 mL fluid drained. Pharmacy has been consulted for cefepime dosing.  Scr 2.06 >> 3.24 >> 4.07  Plan: Planning to start CRRT Will adjust Cefepime from 1 grams q24h to 2 gram IV every 12 hours for CRRT dosing  -thoracentesis culture 2/16: No growth to date   Height: 5\' 2"  (157.5 cm) Weight: 59.8 kg (131 lb 13.4 oz) IBW/kg (Calculated) : 50.1  Temp (24hrs), Avg:97.9 F (36.6 C), Min:97.6 F (36.4 C), Max:98.2 F (36.8 C)  Recent Labs  Lab 02/11/23 0307 02/11/23 0958 02/06/2023 0410 02/13/23 0521 02/14/23 0656 February 16, 2023 0431  WBC  --  10.1 11.3* 8.9 8.0 14.3*  CREATININE 1.85*  --  2.00* 2.06* 3.24* 4.07*     Estimated Creatinine Clearance: 7.8 mL/min (A) (by C-G formula based on SCr of 4.07 mg/dL (H)).    Allergies  Allergen Reactions   Codeine Nausea And Vomiting   Hydrocodone-Acetaminophen Nausea And Vomiting    Antimicrobials this admission: Ceftriaxone 2/6 >> 2/9 cefepime 2/16 >>   Dose adjustments this admission: N/a  Microbiology results: 2/16 Pleural culture:NGTD 2/14 MRSA PCR: not detected  Thank you for allowing pharmacy to be a part of this patient's care.  Jesslynn Kruck A 02/16/2023 10:43 AM

## 2023-02-27 DEATH — deceased

## 2023-11-20 IMAGING — DX DG CHEST 2V
2 series · 3 of 3 positions shown · non-contrast
Comparison: [DATE] [DATE], [DATE].  [DATE] [DATE], [DATE].

CLINICAL DATA: Shortness of breath.

EXAM:
CHEST - 2 VIEW

[chest pa]
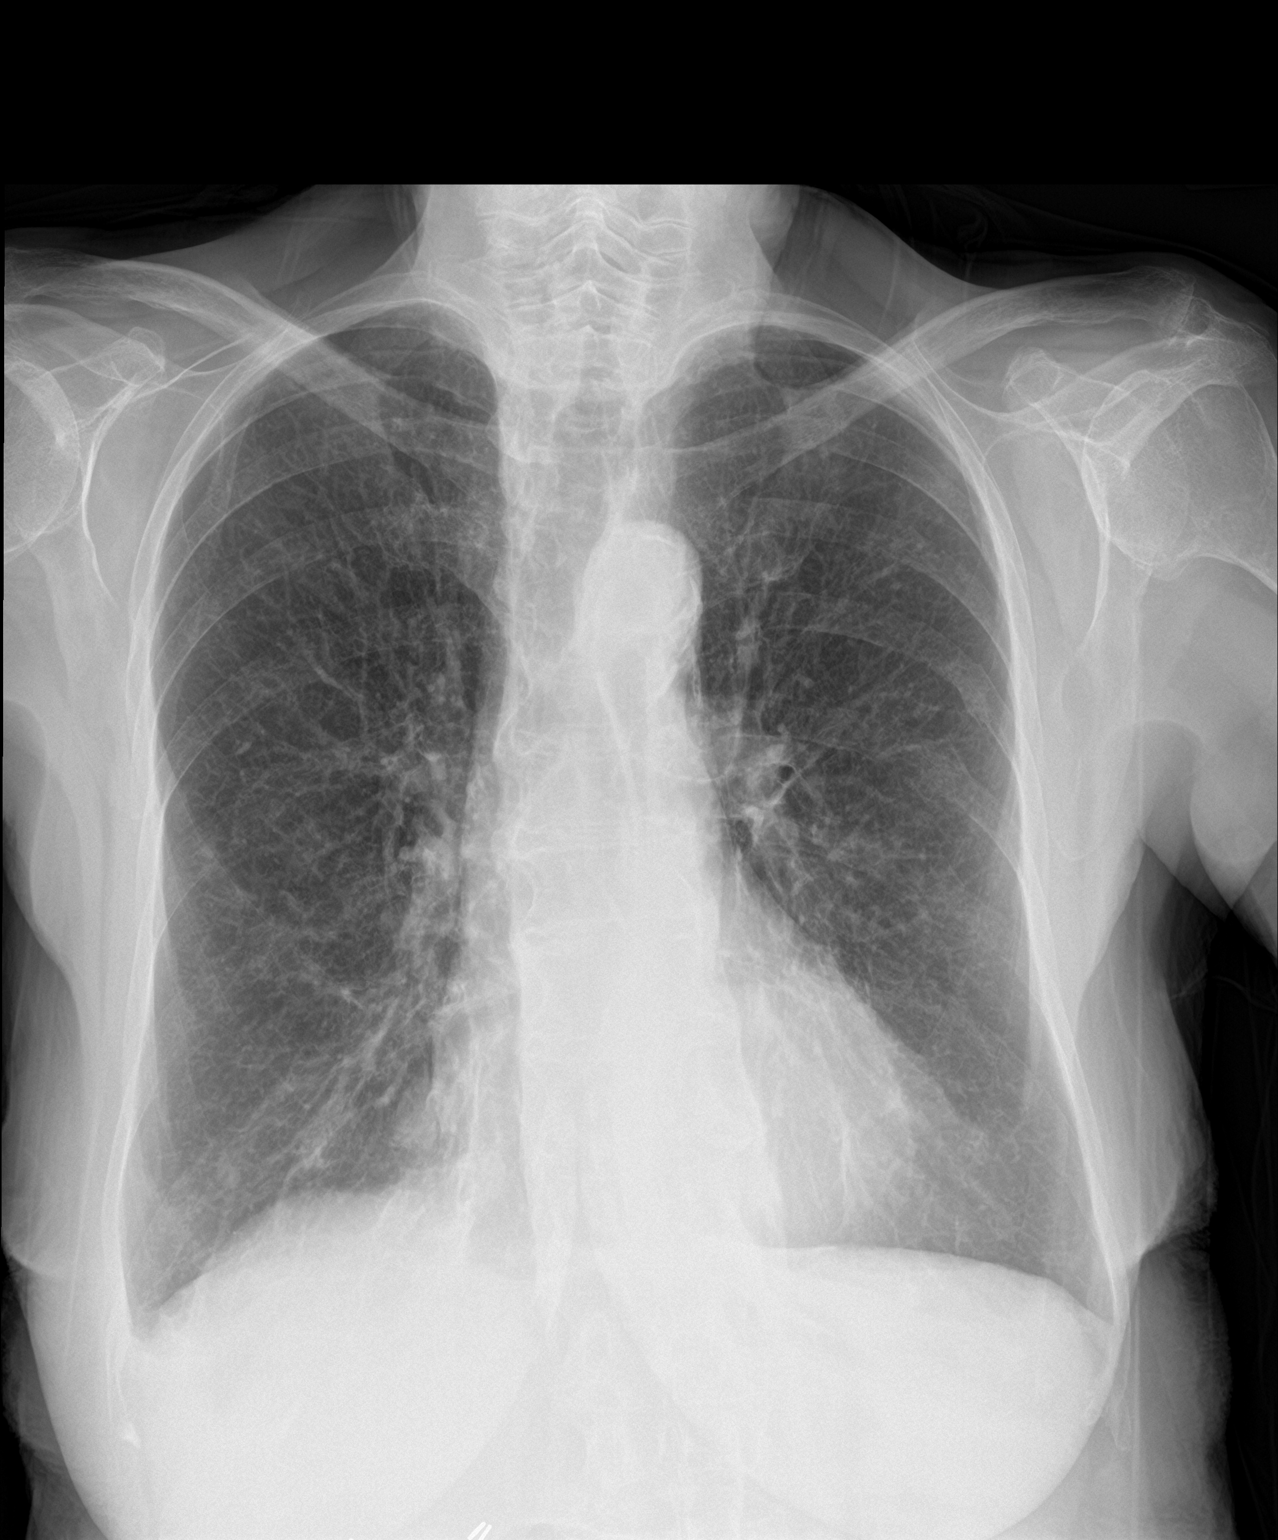

[Series 3: chest lat · 0.14mm/px · 2 of 2 slices shown]
[im 1/2]
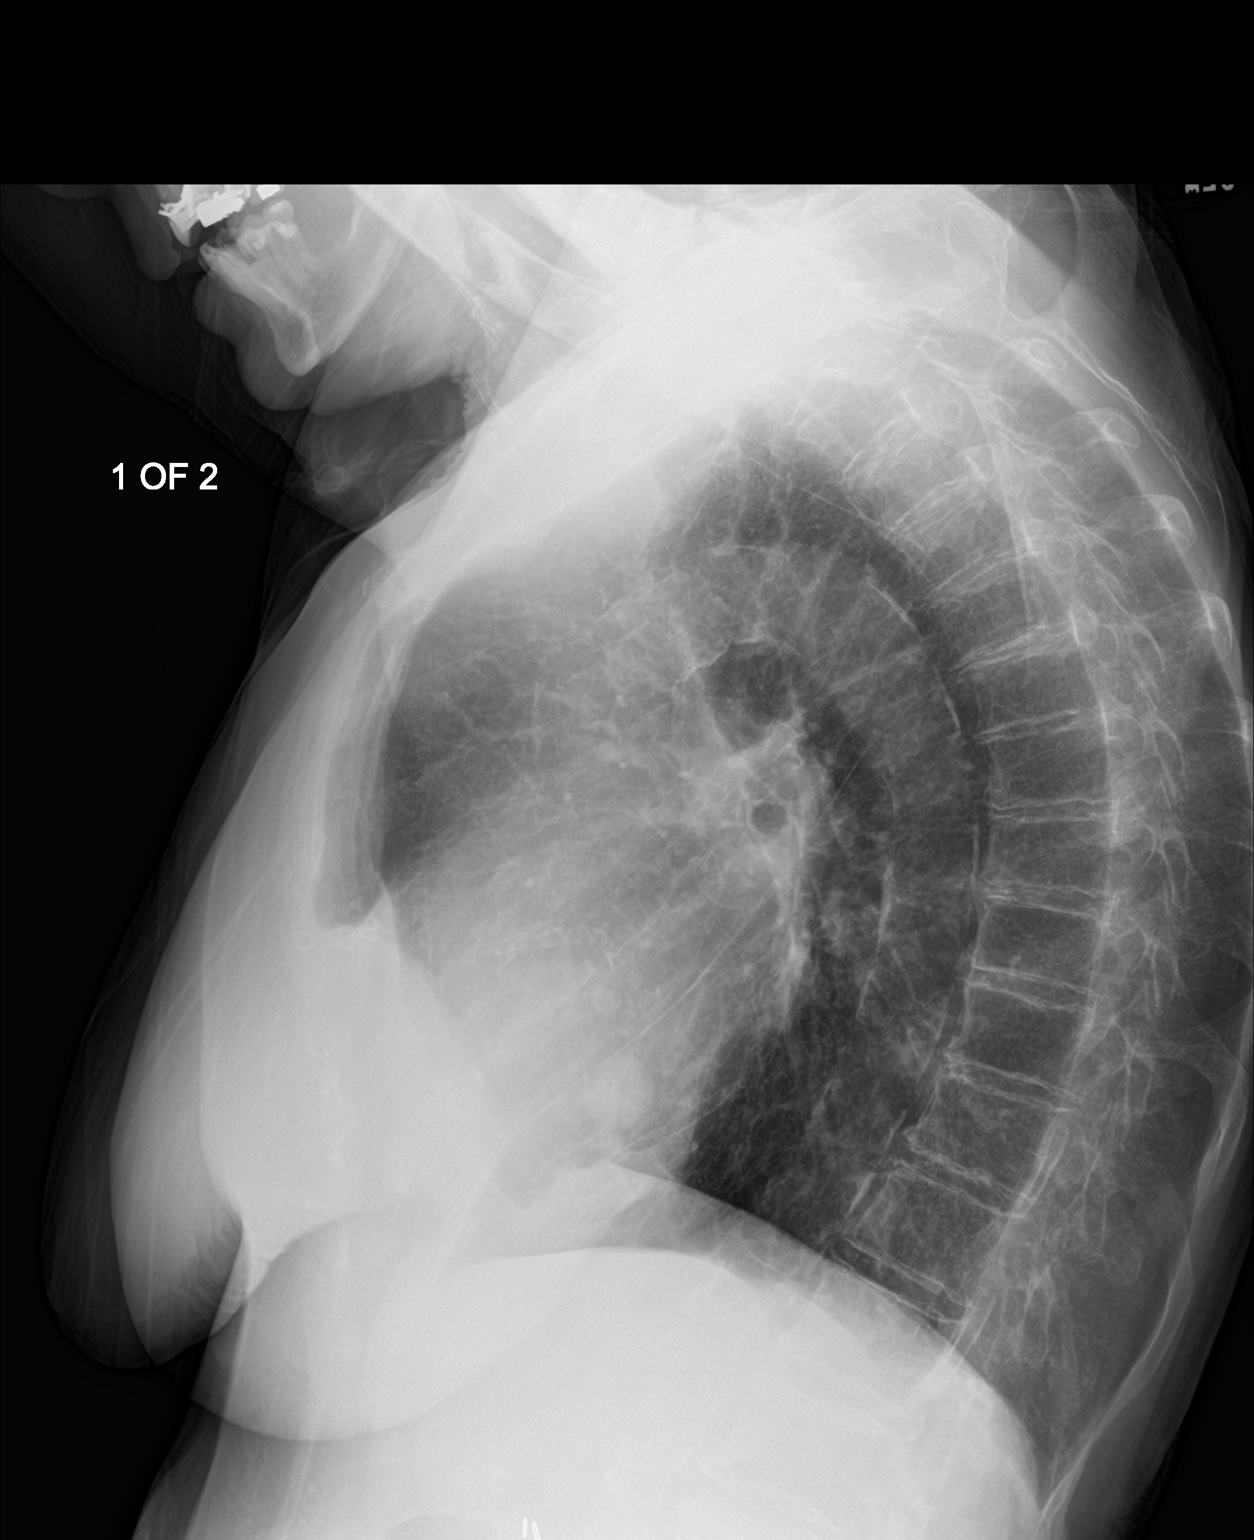
[im 2/2]
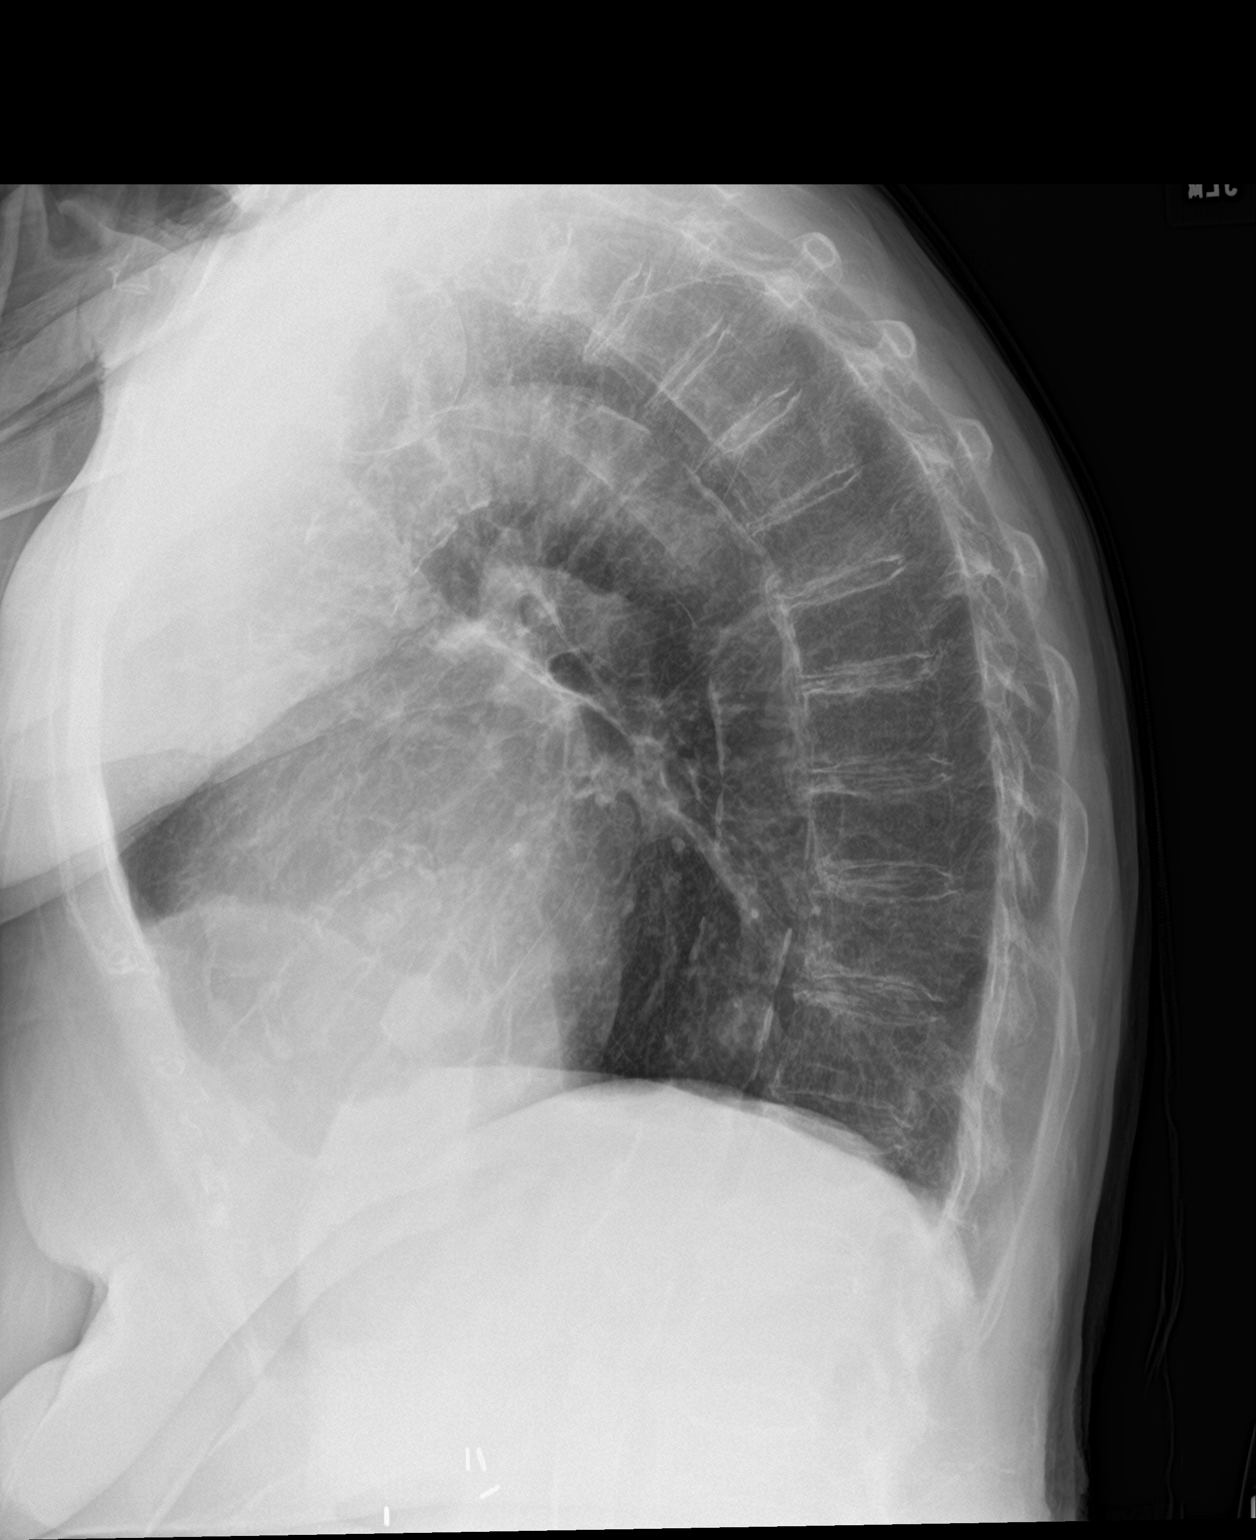

[3 of 3 positions shown; findings below may reference images not displayed]

FINDINGS: The heart size and mediastinal contours are within normal limits.
Nodular density is noted medially in right lung base consistent with
metastatic disease as noted on recent CT scan. No acute pulmonary
consolidation is noted. The visualized skeletal structures are
unremarkable.
IMPRESSION: Nodular density seen medially and right lung base consistent with
metastatic disease. No definite consolidative process is noted.

Aortic Atherosclerosis ([id number].A).
